# Patient Record
Sex: Female | Born: 1980 | Race: Black or African American | Hispanic: No | Marital: Single | Smoking: Never smoker
Health system: Southern US, Community
[De-identification: ages and names within clinical notes are randomized; demographics above are authoritative.]

## PROBLEM LIST (undated history)

## (undated) DIAGNOSIS — O139 Gestational [pregnancy-induced] hypertension without significant proteinuria, unspecified trimester: Secondary | ICD-10-CM

## (undated) DIAGNOSIS — E079 Disorder of thyroid, unspecified: Secondary | ICD-10-CM

## (undated) DIAGNOSIS — D649 Anemia, unspecified: Secondary | ICD-10-CM

## (undated) DIAGNOSIS — T7840XA Allergy, unspecified, initial encounter: Secondary | ICD-10-CM

## (undated) DIAGNOSIS — Z9889 Other specified postprocedural states: Secondary | ICD-10-CM

## (undated) DIAGNOSIS — E039 Hypothyroidism, unspecified: Secondary | ICD-10-CM

## (undated) DIAGNOSIS — I10 Essential (primary) hypertension: Secondary | ICD-10-CM

## (undated) DIAGNOSIS — R740 Nonspecific elevation of levels of transaminase and lactic acid dehydrogenase [LDH]: Secondary | ICD-10-CM

## (undated) DIAGNOSIS — Z972 Presence of dental prosthetic device (complete) (partial): Secondary | ICD-10-CM

## (undated) DIAGNOSIS — R7989 Other specified abnormal findings of blood chemistry: Secondary | ICD-10-CM

## (undated) DIAGNOSIS — F419 Anxiety disorder, unspecified: Secondary | ICD-10-CM

## (undated) DIAGNOSIS — K219 Gastro-esophageal reflux disease without esophagitis: Secondary | ICD-10-CM

## (undated) DIAGNOSIS — T8859XA Other complications of anesthesia, initial encounter: Secondary | ICD-10-CM

## (undated) DIAGNOSIS — L509 Urticaria, unspecified: Secondary | ICD-10-CM

## (undated) DIAGNOSIS — Z8742 Personal history of other diseases of the female genital tract: Secondary | ICD-10-CM

## (undated) DIAGNOSIS — J45909 Unspecified asthma, uncomplicated: Secondary | ICD-10-CM

## (undated) DIAGNOSIS — F32A Depression, unspecified: Secondary | ICD-10-CM

## (undated) DIAGNOSIS — R7301 Impaired fasting glucose: Secondary | ICD-10-CM

## (undated) DIAGNOSIS — E559 Vitamin D deficiency, unspecified: Secondary | ICD-10-CM

## (undated) DIAGNOSIS — G709 Myoneural disorder, unspecified: Secondary | ICD-10-CM

## (undated) DIAGNOSIS — R7401 Elevation of levels of liver transaminase levels: Secondary | ICD-10-CM

## (undated) DIAGNOSIS — E0500 Thyrotoxicosis with diffuse goiter without thyrotoxic crisis or storm: Secondary | ICD-10-CM

## (undated) DIAGNOSIS — R112 Nausea with vomiting, unspecified: Secondary | ICD-10-CM

## (undated) DIAGNOSIS — E669 Obesity, unspecified: Secondary | ICD-10-CM

## (undated) DIAGNOSIS — T4145XA Adverse effect of unspecified anesthetic, initial encounter: Secondary | ICD-10-CM

## (undated) DIAGNOSIS — E538 Deficiency of other specified B group vitamins: Secondary | ICD-10-CM

## (undated) DIAGNOSIS — R911 Solitary pulmonary nodule: Secondary | ICD-10-CM

## (undated) DIAGNOSIS — IMO0001 Reserved for inherently not codable concepts without codable children: Secondary | ICD-10-CM

## (undated) HISTORY — DX: Impaired fasting glucose: R73.01

## (undated) HISTORY — DX: Deficiency of other specified B group vitamins: E53.8

## (undated) HISTORY — DX: Personal history of other diseases of the female genital tract: Z87.42

## (undated) HISTORY — DX: Allergy, unspecified, initial encounter: T78.40XA

## (undated) HISTORY — DX: Thyrotoxicosis with diffuse goiter without thyrotoxic crisis or storm: E05.00

## (undated) HISTORY — PX: CERVICAL POLYPECTOMY: SHX88

## (undated) HISTORY — DX: Myoneural disorder, unspecified: G70.9

## (undated) HISTORY — DX: Anxiety disorder, unspecified: F41.9

## (undated) HISTORY — DX: Other specified postprocedural states: Z98.890

## (undated) HISTORY — PX: CHOLECYSTECTOMY: SHX55

## (undated) HISTORY — PX: BREAST SURGERY: SHX581

## (undated) HISTORY — DX: Nonspecific elevation of levels of transaminase and lactic acid dehydrogenase (ldh): R74.0

## (undated) HISTORY — DX: Elevation of levels of liver transaminase levels: R74.01

## (undated) HISTORY — DX: Vitamin D deficiency, unspecified: E55.9

## (undated) HISTORY — DX: Obesity, unspecified: E66.9

## (undated) HISTORY — DX: Reserved for inherently not codable concepts without codable children: IMO0001

## (undated) HISTORY — DX: Disorder of thyroid, unspecified: E07.9

## (undated) HISTORY — DX: Other specified abnormal findings of blood chemistry: R79.89

## (undated) HISTORY — DX: Essential (primary) hypertension: I10

## (undated) HISTORY — DX: Gestational (pregnancy-induced) hypertension without significant proteinuria, unspecified trimester: O13.9

## (undated) HISTORY — DX: Urticaria, unspecified: L50.9

## (undated) HISTORY — DX: Gastro-esophageal reflux disease without esophagitis: K21.9

---
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
## 2004-02-13 ENCOUNTER — Observation Stay: Payer: Self-pay | Admitting: Obstetrics & Gynecology

## 2004-03-27 ENCOUNTER — Emergency Department: Payer: Self-pay | Admitting: Emergency Medicine

## 2004-04-14 ENCOUNTER — Inpatient Hospital Stay: Payer: Self-pay | Admitting: Obstetrics & Gynecology

## 2004-04-15 DIAGNOSIS — I10 Essential (primary) hypertension: Secondary | ICD-10-CM

## 2004-05-01 ENCOUNTER — Emergency Department: Payer: Self-pay | Admitting: Emergency Medicine

## 2004-05-02 ENCOUNTER — Ambulatory Visit: Payer: Self-pay | Admitting: Emergency Medicine

## 2004-05-16 ENCOUNTER — Ambulatory Visit: Payer: Self-pay | Admitting: Surgery

## 2004-11-26 ENCOUNTER — Emergency Department: Payer: Self-pay | Admitting: Emergency Medicine

## 2004-12-28 ENCOUNTER — Emergency Department: Payer: Self-pay | Admitting: Emergency Medicine

## 2005-02-13 ENCOUNTER — Emergency Department: Payer: Self-pay | Admitting: Emergency Medicine

## 2005-06-15 ENCOUNTER — Emergency Department: Payer: Self-pay | Admitting: Emergency Medicine

## 2005-07-02 ENCOUNTER — Emergency Department: Payer: Self-pay | Admitting: Emergency Medicine

## 2005-08-09 ENCOUNTER — Emergency Department: Payer: Self-pay | Admitting: Emergency Medicine

## 2005-08-26 ENCOUNTER — Emergency Department: Payer: Self-pay | Admitting: General Practice

## 2005-10-06 ENCOUNTER — Emergency Department: Payer: Self-pay | Admitting: Emergency Medicine

## 2005-10-07 ENCOUNTER — Emergency Department: Payer: Self-pay | Admitting: Emergency Medicine

## 2006-02-21 ENCOUNTER — Emergency Department: Payer: Self-pay | Admitting: Emergency Medicine

## 2006-02-21 ENCOUNTER — Other Ambulatory Visit: Payer: Self-pay

## 2006-02-22 ENCOUNTER — Ambulatory Visit: Payer: Self-pay | Admitting: Emergency Medicine

## 2006-04-30 ENCOUNTER — Observation Stay: Payer: Self-pay

## 2006-06-06 ENCOUNTER — Observation Stay: Payer: Self-pay | Admitting: Obstetrics & Gynecology

## 2006-07-25 ENCOUNTER — Observation Stay: Payer: Self-pay | Admitting: Unknown Physician Specialty

## 2006-07-28 ENCOUNTER — Observation Stay: Payer: Self-pay

## 2006-07-29 ENCOUNTER — Inpatient Hospital Stay: Payer: Self-pay | Admitting: Unknown Physician Specialty

## 2006-09-23 ENCOUNTER — Ambulatory Visit: Payer: Self-pay

## 2006-10-28 ENCOUNTER — Emergency Department: Payer: Self-pay | Admitting: Emergency Medicine

## 2007-05-26 ENCOUNTER — Emergency Department: Payer: Self-pay | Admitting: Internal Medicine

## 2007-09-14 ENCOUNTER — Ambulatory Visit: Payer: Self-pay

## 2007-11-19 ENCOUNTER — Emergency Department: Payer: Self-pay | Admitting: Emergency Medicine

## 2008-04-24 ENCOUNTER — Ambulatory Visit: Payer: Self-pay

## 2009-06-08 ENCOUNTER — Emergency Department: Payer: Self-pay | Admitting: Emergency Medicine

## 2010-05-30 ENCOUNTER — Emergency Department: Payer: Self-pay | Admitting: Emergency Medicine

## 2010-06-10 ENCOUNTER — Emergency Department: Payer: Self-pay | Admitting: Emergency Medicine

## 2010-06-12 ENCOUNTER — Emergency Department: Payer: Self-pay | Admitting: Emergency Medicine

## 2010-10-27 ENCOUNTER — Emergency Department: Payer: Self-pay | Admitting: Emergency Medicine

## 2010-10-28 ENCOUNTER — Ambulatory Visit: Payer: Self-pay | Admitting: Emergency Medicine

## 2012-08-11 ENCOUNTER — Emergency Department: Payer: Self-pay | Admitting: Internal Medicine

## 2012-08-13 ENCOUNTER — Emergency Department: Payer: Self-pay | Admitting: Emergency Medicine

## 2012-08-13 LAB — BASIC METABOLIC PANEL
Anion Gap: 6 — ABNORMAL LOW (ref 7–16)
BUN: 10 mg/dL (ref 7–18)
Calcium, Total: 9 mg/dL (ref 8.5–10.1)
Chloride: 105 mmol/L (ref 98–107)
Co2: 23 mmol/L (ref 21–32)
Creatinine: 0.67 mg/dL (ref 0.60–1.30)
EGFR (African American): >60
EGFR (Non-African Amer.): >60
Glucose: 101 mg/dL — ABNORMAL HIGH (ref 65–99)
Osmolality: 267 (ref 275–301)
Potassium: 3.9 mmol/L (ref 3.5–5.1)
Sodium: 134 mmol/L — ABNORMAL LOW (ref 136–145)

## 2012-08-13 LAB — URINALYSIS, COMPLETE
Bacteria: NONE SEEN
Bilirubin,UR: NEGATIVE
Glucose,UR: NEGATIVE mg/dL (ref 0–75)
Ketone: NEGATIVE
Leukocyte Esterase: NEGATIVE
Nitrite: NEGATIVE
Ph: 9 (ref 4.5–8.0)
Protein: NEGATIVE
RBC,UR: 7 /HPF (ref 0–5)
Specific Gravity: 1.015 (ref 1.003–1.030)
Squamous Epithelial: 1
WBC UR: <1 /HPF (ref 0–5)

## 2012-08-13 LAB — CBC
HCT: 40.2 % (ref 35.0–47.0)
HGB: 13.1 g/dL (ref 12.0–16.0)
MCH: 28 pg (ref 26.0–34.0)
MCHC: 32.7 g/dL (ref 32.0–36.0)
MCV: 86 fL (ref 80–100)
Platelet: 304 10*3/uL (ref 150–440)
RBC: 4.69 10*6/uL (ref 3.80–5.20)
RDW: 13.6 % (ref 11.5–14.5)
WBC: 11.1 10*3/uL — ABNORMAL HIGH (ref 3.6–11.0)

## 2012-10-01 ENCOUNTER — Emergency Department: Payer: Self-pay | Admitting: Emergency Medicine

## 2013-08-23 ENCOUNTER — Ambulatory Visit: Payer: Self-pay | Admitting: Family Medicine

## 2013-08-24 ENCOUNTER — Ambulatory Visit: Payer: Self-pay | Admitting: Family Medicine

## 2013-08-26 DIAGNOSIS — R002 Palpitations: Secondary | ICD-10-CM

## 2013-10-09 DIAGNOSIS — J4520 Mild intermittent asthma, uncomplicated: Secondary | ICD-10-CM | POA: Insufficient documentation

## 2014-05-17 ENCOUNTER — Ambulatory Visit: Payer: Self-pay | Admitting: Family Medicine

## 2014-05-17 LAB — HM MAMMOGRAPHY

## 2014-06-27 DIAGNOSIS — Z124 Encounter for screening for malignant neoplasm of cervix: Secondary | ICD-10-CM | POA: Insufficient documentation

## 2014-06-27 LAB — HM PAP SMEAR

## 2014-07-18 ENCOUNTER — Ambulatory Visit: Payer: Self-pay | Admitting: General Surgery

## 2014-07-30 ENCOUNTER — Ambulatory Visit (INDEPENDENT_AMBULATORY_CARE_PROVIDER_SITE_OTHER): Payer: 59 | Admitting: General Surgery

## 2014-07-30 ENCOUNTER — Encounter: Payer: Self-pay | Admitting: General Surgery

## 2014-07-30 VITALS — BP 134/78 | HR 78 | Resp 15 | Ht 63.0 in | Wt 216.0 lb

## 2014-07-30 DIAGNOSIS — N644 Mastodynia: Secondary | ICD-10-CM | POA: Diagnosis not present

## 2014-07-30 NOTE — Patient Instructions (Addendum)
Follow up in 3 months.  Continue self breast exams. Call office for any new breast issues or concerns.  

## 2014-07-30 NOTE — Progress Notes (Signed)
Patient ID: Catherine Berg, female   DOB: [birth year], 34 y.o.   MRN: [id number]  Chief Complaint  Patient presents with  . Other    mammogram    HPI [name] is a 34 y.o. female who presents for a breast evaluation. The most recent mammogram and ultrasound was done on 05/17/14. Patient does perform regular self breast checks and gets regular mammograms done. She reports some soreness and fullness in the right breast that started about 2 months ago. She reports feeling a indistinct hardness in the right breast, she also reports warmth and redness occasionally in this breast. She described "Tiger stripes" running transversely across the upper breast. These were specifically not running towards the nipple. There is no history of nipple oral contact.  HPI  Past Medical History  Diagnosis Date  . Anxiety   . Low serum vitamin D     Past Surgical History  Procedure Laterality Date  . Cesarean section      No family history on file.  Social History History  Substance Use Topics  . Smoking status: Never Smoker   . Smokeless tobacco: Never Used  . Alcohol Use: No    Allergies  Allergen Reactions  . Latex Hives  . Shellfish Allergy Hives    Current Outpatient Prescriptions  Medication Sig Dispense Refill  . etonogestrel (NEXPLANON) 68 MG IMPL implant 1 each by Subdermal route once.    Marland Kitchen LORazepam (ATIVAN) 0.5 MG tablet Take 0.5 mg by mouth as needed for anxiety.    Marland Kitchen VITAMIN D, CHOLECALCIFEROL, PO Take 1 capsule by mouth once a week.     No current facility-administered medications for this visit.    Review of Systems Review of Systems  Constitutional: Negative.   Respiratory: Negative.   Cardiovascular: Negative.     Blood pressure 134/78, pulse 78, resp. rate 15, height  (1.6 m), weight 216 lb (97.977 kg).  Physical Exam Physical Exam  Constitutional: She is oriented to person, place, and time. She appears well-developed and well-nourished.  Eyes:  Conjunctivae are normal. No scleral icterus.  Neck: Neck supple.  Cardiovascular: Normal rate, regular rhythm and normal heart sounds.   Pulmonary/Chest: Effort normal and breath sounds normal. Right breast exhibits tenderness. Right breast exhibits no inverted nipple, no mass, no nipple discharge and no skin change. Left breast exhibits no inverted nipple, no mass, no nipple discharge, no skin change and no tenderness.    Lymphadenopathy:    She has no cervical adenopathy.    She has no axillary adenopathy.  Neurological: She is alert and oriented to person, place, and time.  Skin: Skin is warm and dry.    Data Reviewed PCP notes of uric 24, 2016.  Laboratory studies of 06/27/2014 showed white blood cell count of 7000 with 47% polys and 34% lymphocytes. Hemoglobin 13.1. Comprehensive metabolic panel was normal.  Bilateral mammograms showed essentially fatty replaced breast. Ultrasound 05/17/2014 was negative. BI-RADS-1.  Assessment    Breast thickening along the inframammary fold without discrete mass.  Intermittent mastalgia/swelling/warm without clear etiology.     Plan    The patient has nearly fatty replaced breasts at her mammograms are of excellent quality. Her exam today is negative for distinct mass outside of adipose tissue. I've encouraged a careful watchful waiting program, and we'll arrange to see the patient again in 3 months. She's been encouraged to call earlier if new problems arise.     PCP/Ref: Dr Colvin Caroli, Tinnie Gens  W 07/30/2014, 10:08 PM

## 2014-08-02 ENCOUNTER — Emergency Department
Admit: 2014-08-02 | Disposition: A | Discharge status: Home / Self Care | Payer: Self-pay | Admitting: Emergency Medicine

## 2014-10-30 ENCOUNTER — Ambulatory Visit: Payer: 59 | Admitting: General Surgery

## 2014-11-06 ENCOUNTER — Telehealth: Payer: Self-pay

## 2014-11-06 NOTE — Telephone Encounter (Signed)
She is having some itching in the vaginal area and would like a rx for Diflucan.

## 2014-11-08 MED ORDER — FLUCONAZOLE 150 MG PO TABS: 150.0000 mg | ORAL_TABLET | Freq: Once | ORAL | Status: DC

## 2014-11-08 NOTE — Telephone Encounter (Signed)
Patient notified

## 2014-11-08 NOTE — Telephone Encounter (Signed)
If not better with diflucan, needs to come in for swab.

## 2014-11-08 NOTE — Telephone Encounter (Signed)
Dr. Laural BenesJohnson, I sent this to Dr. Sherie DonLada on Tuesday but she must have never looked at it yesterday. She is still itching and would like a rx for Diflucan.

## 2014-12-06 ENCOUNTER — Encounter: Payer: Self-pay | Admitting: *Deleted

## 2014-12-18 ENCOUNTER — Telehealth: Payer: Self-pay

## 2014-12-18 NOTE — Telephone Encounter (Signed)
Patient left message to call [id number].

## 2014-12-18 NOTE — Telephone Encounter (Signed)
Left message to call.

## 2014-12-19 NOTE — Telephone Encounter (Signed)
I'm glad to hear she has an appt upcoming I guess I don't know what to recommend without examining her and getting tests Request labs from health dept

## 2014-12-19 NOTE — Telephone Encounter (Signed)
Female issues going on, she went to health department and had all STD testing done. Everything came back negative.They gave her no meds. But she states she still feels irritated below. Almost like a UTI feeling, sometimes painful when urinating. They did not check a urine at the health dept. She has no vaginal discharge. She does not know if the health dept. Checked for a yeast infection or BV infection. But she was on a high dose of antibiotics. Wants advice on what to do.

## 2014-12-20 DIAGNOSIS — E669 Obesity, unspecified: Secondary | ICD-10-CM | POA: Insufficient documentation

## 2014-12-20 DIAGNOSIS — E538 Deficiency of other specified B group vitamins: Secondary | ICD-10-CM | POA: Insufficient documentation

## 2014-12-20 DIAGNOSIS — Z8742 Personal history of other diseases of the female genital tract: Secondary | ICD-10-CM | POA: Insufficient documentation

## 2014-12-20 DIAGNOSIS — R7301 Impaired fasting glucose: Secondary | ICD-10-CM | POA: Insufficient documentation

## 2014-12-20 DIAGNOSIS — E559 Vitamin D deficiency, unspecified: Secondary | ICD-10-CM | POA: Insufficient documentation

## 2014-12-21 NOTE — Telephone Encounter (Signed)
Patient notified

## 2014-12-24 ENCOUNTER — Ambulatory Visit (INDEPENDENT_AMBULATORY_CARE_PROVIDER_SITE_OTHER): Payer: 59 | Admitting: Family Medicine

## 2014-12-24 ENCOUNTER — Encounter: Payer: Self-pay | Admitting: Family Medicine

## 2014-12-24 VITALS — BP 142/82 | HR 82 | Temp 97.2°F | Ht 62.5 in | Wt 211.0 lb

## 2014-12-24 DIAGNOSIS — Z114 Encounter for screening for human immunodeficiency virus [HIV]: Secondary | ICD-10-CM | POA: Diagnosis not present

## 2014-12-24 DIAGNOSIS — F4381 Prolonged grief disorder: Secondary | ICD-10-CM

## 2014-12-24 DIAGNOSIS — R7301 Impaired fasting glucose: Secondary | ICD-10-CM

## 2014-12-24 DIAGNOSIS — I10 Essential (primary) hypertension: Secondary | ICD-10-CM

## 2014-12-24 DIAGNOSIS — F329 Major depressive disorder, single episode, unspecified: Secondary | ICD-10-CM | POA: Diagnosis not present

## 2014-12-24 DIAGNOSIS — E538 Deficiency of other specified B group vitamins: Secondary | ICD-10-CM | POA: Diagnosis not present

## 2014-12-24 DIAGNOSIS — R300 Dysuria: Secondary | ICD-10-CM | POA: Diagnosis not present

## 2014-12-24 DIAGNOSIS — F4321 Adjustment disorder with depressed mood: Secondary | ICD-10-CM

## 2014-12-24 DIAGNOSIS — R102 Pelvic and perineal pain unspecified side: Secondary | ICD-10-CM

## 2014-12-24 DIAGNOSIS — F4329 Adjustment disorder with other symptoms: Secondary | ICD-10-CM

## 2014-12-24 DIAGNOSIS — F32A Depression, unspecified: Secondary | ICD-10-CM

## 2014-12-24 DIAGNOSIS — E559 Vitamin D deficiency, unspecified: Secondary | ICD-10-CM | POA: Diagnosis not present

## 2014-12-24 LAB — UA/M W/RFLX CULTURE, ROUTINE
Bilirubin, UA: NEGATIVE
Glucose, UA: NEGATIVE
Ketones, UA: NEGATIVE
Leukocytes, UA: NEGATIVE
Nitrite, UA: NEGATIVE
Protein, UA: NEGATIVE
Specific Gravity, UA: 1.02 (ref 1.005–1.030)
Urobilinogen, Ur: 1 mg/dL (ref 0.2–1.0)
pH, UA: 6 (ref 5.0–7.5)

## 2014-12-24 LAB — WET PREP FOR TRICH, YEAST, CLUE
Clue Cell Exam: POSITIVE — AB
Trichomonas Exam: NEGATIVE
Yeast Exam: NEGATIVE

## 2014-12-24 LAB — MICROSCOPIC EXAMINATION

## 2014-12-24 MED ORDER — ESCITALOPRAM OXALATE 10 MG PO TABS: 10.0000 mg | ORAL_TABLET | Freq: Every day | ORAL | Status: DC

## 2014-12-24 MED ORDER — AMLODIPINE BESYLATE 5 MG PO TABS: 5.0000 mg | ORAL_TABLET | Freq: Every day | ORAL | Status: DC

## 2014-12-24 NOTE — Assessment & Plan Note (Signed)
DASH guidelines; modest weight loss; start CCB, discussed possible side effects; hope that after months of healthier eating and weight loss, we can discontinue the CCB; return for close f/u

## 2014-12-24 NOTE — Assessment & Plan Note (Signed)
Check A1C and glucose today; lots of recent stress which could affect cortisol and glucose

## 2014-12-24 NOTE — Progress Notes (Signed)
BP 142/82 mmHg  Pulse 82  Temp(Src) 97.2 F (36.2 C)  Ht 5' 2.5" (1.588 m)  Wt 211 lb (95.709 kg)  BMI 37.95 kg/m2  SpO2 100%  LMP 12/14/2014   Subjective:    Patient ID: Catherine Berg, female    DOB: 11-07-1980, 34 y.o.   MRN: [id number]  HPI: SANDRINE BLOODSWORTH is a 34 y.o. female  Chief Complaint  Patient presents with  . Depression    She is struggling after loosing her mom  . Vaginal Itching    not "itching" as much but feels irritated   She is having some irritation down below; she went to the health dept, but they didn't find anything; she did the pelvic exam and it kind of hurt; she has a lot going on she says She is struggling since her mother's death; got sick on Sep 26, 2022 and died on 23-Sep-2022; cries a lot of days; she has thought about taking medication; she has never taken anything for mood; waking up at night; no thoughts of self-harm or hurting others; appetite is up and down; gets upset and a little nauseated, some trouble with concentration sometimes; headaches; not migraines; crazy thirsty lately  Vitamin D deficiency; last labs in Feb 2016  Nexplanon out; this is her 3rd period since she had it removed, LMP started around August 12th; last period was heavier than before; sexually active, breasts a little sore but all over, not on the sides  Has an issue with her legs hurting; hurt all the way from mid thigh all the way down; stands at work all day; been there eight years; also has pain along the plantar surface of both feet when she puts her feet on the floor  Her blood pressure has been a little high at the dentist; she had community program at church, 140s/80s; HTN runs in family; she has never taken medicines for HTN, but did have pregnancy-induced hypertension, not pre-eclampsia  Relevant past medical, surgical, family and social history reviewed and updated as indicated. Interim medical history since our last visit reviewed. Allergies and medications reviewed and  updated.  Review of Systems  Per HPI unless specifically indicated above     Objective:    BP 142/82 mmHg  Pulse 82  Temp(Src) 97.2 F (36.2 C)  Ht 5' 2.5" (1.588 m)  Wt 211 lb (95.709 kg)  BMI 37.95 kg/m2  SpO2 100%  LMP 12/14/2014  Wt Readings from Last 3 Encounters:  12/24/14 211 lb (95.709 kg)  06/27/14 216 lb (97.977 kg)  07/30/14 216 lb (97.977 kg)    Physical Exam  Constitutional: She appears well-developed and well-nourished.  obese  HENT:  Head: Normocephalic and atraumatic.  Eyes: EOM are normal. No scleral icterus.  Neck: No thyromegaly present.  Cardiovascular: Normal rate and regular rhythm.   Pulmonary/Chest: Effort normal and breath sounds normal.  Abdominal: She exhibits no distension. There is tenderness in the suprapubic area. There is no guarding and no CVA tenderness.  Genitourinary: There is no rash on the right labia. There is no rash on the left labia. Uterus is not tender. Cervix exhibits no motion tenderness, no discharge and no friability. Right adnexum displays no mass, no tenderness and no fullness. Left adnexum displays no mass, no tenderness and no fullness. No erythema or bleeding in the vagina. Vaginal discharge (watery thin discharge at the introitus) found.  Psychiatric: Her speech is normal and behavior is normal. Judgment and thought content normal. Her mood appears not anxious.  Her affect is not angry, not blunt, not labile and not inappropriate. She is not slowed and not withdrawn. Cognition and memory are normal. She exhibits a depressed mood. She expresses no homicidal and no suicidal ideation.      Assessment & Plan:   Problem List Items Addressed This Visit      Cardiovascular and Mediastinum   Essential hypertension, benign    DASH guidelines; modest weight loss; start CCB, discussed possible side effects; hope that after months of healthier eating and weight loss, we can discontinue the CCB; return for close f/u      Relevant  Medications   amLODipine (NORVASC) 5 MG tablet     Digestive   Vitamin B12 deficiency    Check level and supplement if needed      Relevant Orders   Vitamin B12     Endocrine   IFG (impaired fasting glucose)    Check A1C and glucose today; lots of recent stress which could affect cortisol and glucose      Relevant Orders   Hgb A1c w/o eAG     Other   Vitamin D deficiency disease    Check level today, as low vit D could worsen symptoms of depression and myalgias      Relevant Orders   Vit D  25 hydroxy (rtn osteoporosis monitoring)   Dysuria    Urine collected today, will see if positive for acute cystitis; no CVA to suggest pyelo      Pelvic pain in female   Relevant Orders   Comprehensive metabolic panel   UA/M w/rflx Culture, Routine (Completed)   Chlamydia/Gonococcus/Trichomonas, NAA   WET PREP FOR TRICH, YEAST, CLUE (Completed)   Prolonged grief reaction - Primary    Suggested seeing Hospice counselors for grief; supportive listening provided; start SSRI, close f/u       Other Visit Diagnoses    Depression        Relevant Medications    escitalopram (LEXAPRO) 10 MG tablet    Other Relevant Orders    TSH    Screening for HIV (human immunodeficiency virus)        Relevant Orders    HIV antibody       Follow up plan: Return in about 3 weeks (around 01/14/2015) for blood pressure, mood.  An after-visit summary was printed and given to the patient at check-out.  Please see the patient instructions which may contain other information and recommendations beyond what is mentioned above in the assessment and plan.  Meds ordered this encounter  Medications  . escitalopram (LEXAPRO) 10 MG tablet    Sig: Take 1 tablet (10 mg total) by mouth daily.    Dispense:  30 tablet    Refill:  0  . amLODipine (NORVASC) 5 MG tablet    Sig: Take 1 tablet (5 mg total) by mouth daily.    Dispense:  30 tablet    Refill:  1

## 2014-12-24 NOTE — Assessment & Plan Note (Signed)
Suggested seeing Hospice counselors for grief; supportive listening provided; start SSRI, close f/u

## 2014-12-24 NOTE — Assessment & Plan Note (Signed)
Urine collected today, will see if positive for acute cystitis; no CVA to suggest pyelo

## 2014-12-24 NOTE — Assessment & Plan Note (Signed)
Check level and supplement if needed 

## 2014-12-24 NOTE — Assessment & Plan Note (Signed)
Check level today, as low vit D could worsen symptoms of depression and myalgias

## 2014-12-24 NOTE — Patient Instructions (Signed)
Please do contact Hospice for counseling If you need additional contact information for other counselors, you can see [employer] or give [place] a call Start the new blood pressure medicine Start the new mood medicine If you feel worse prior to your next appointment, please call Return in 3 weeks

## 2014-12-25 ENCOUNTER — Telehealth: Payer: Self-pay | Admitting: Family Medicine

## 2014-12-25 DIAGNOSIS — E0590 Thyrotoxicosis, unspecified without thyrotoxic crisis or storm: Secondary | ICD-10-CM | POA: Insufficient documentation

## 2014-12-25 LAB — COMPREHENSIVE METABOLIC PANEL
ALT: 22 IU/L (ref 0–32)
AST: 18 IU/L (ref 0–40)
Albumin/Globulin Ratio: 1.3 (ref 1.1–2.5)
Albumin: 4.1 g/dL (ref 3.5–5.5)
Alkaline Phosphatase: 74 IU/L (ref 39–117)
BUN/Creatinine Ratio: 16 (ref 8–20)
BUN: 9 mg/dL (ref 6–20)
Bilirubin Total: 0.2 mg/dL (ref 0.0–1.2)
CO2: 23 mmol/L (ref 18–29)
Calcium: 9.6 mg/dL (ref 8.7–10.2)
Chloride: 100 mmol/L (ref 97–108)
Creatinine, Ser: 0.56 mg/dL — ABNORMAL LOW (ref 0.57–1.00)
GFR calc Af Amer: 141 mL/min/{1.73_m2} (ref >59)
GFR calc non Af Amer: 122 mL/min/{1.73_m2} (ref >59)
Globulin, Total: 3.2 g/dL (ref 1.5–4.5)
Glucose: 88 mg/dL (ref 65–99)
Potassium: 4.4 mmol/L (ref 3.5–5.2)
Sodium: 139 mmol/L (ref 134–144)
Total Protein: 7.3 g/dL (ref 6.0–8.5)

## 2014-12-25 LAB — TSH: TSH: 0.005 u[IU]/mL — ABNORMAL LOW (ref 0.450–4.500)

## 2014-12-25 LAB — HGB A1C W/O EAG: Hgb A1c MFr Bld: 5.4 % (ref 4.8–5.6)

## 2014-12-25 LAB — HIV ANTIBODY (ROUTINE TESTING W REFLEX): HIV Screen 4th Generation wRfx: NONREACTIVE

## 2014-12-25 LAB — VITAMIN B12: Vitamin B-12: 341 pg/mL (ref 211–946)

## 2014-12-25 LAB — CHLAMYDIA/GONOCOCCUS/TRICHOMONAS, NAA: Trich vag by NAA: NEGATIVE

## 2014-12-25 LAB — VITAMIN D 25 HYDROXY (VIT D DEFICIENCY, FRACTURES): Vit D, 25-Hydroxy: 19.3 ng/mL — ABNORMAL LOW (ref 30.0–100.0)

## 2014-12-25 MED ORDER — CLINDAMYCIN PHOSPHATE 2 % VA CREA: 1.0000 | TOPICAL_CREAM | Freq: Every day | VAGINAL | Status: DC

## 2014-12-25 NOTE — Telephone Encounter (Signed)
Start taking 5,000 iu vitamin D3 daily, one a day for 3 months, then 1,000 iu vit D3 daily Vitamin B12 500 or 1000 mcg daily, year She had very low TSH; she saw endo over a year ago, and didn't like her and will see another endo; [facility] requested Endo did [place] already at [facility] to evaluate this so I will not order another Clue cells on wet mount; she doesn't want metronidazole; will send another med Other labs okay

## 2015-01-14 ENCOUNTER — Telehealth: Payer: Self-pay | Admitting: Family Medicine

## 2015-01-14 ENCOUNTER — Ambulatory Visit: Payer: PRIVATE HEALTH INSURANCE | Admitting: Family Medicine

## 2015-01-14 NOTE — Telephone Encounter (Signed)
PT CAME IN AT 10:15 THINKING HER APPT WAS AT 10:45 BUT IT WAS AT 8. SHE SOUL LIKE TO SPEAK WITH DR LADA ABOUT SOME THINGS THAT ARE GOING ON.

## 2015-01-14 NOTE — Telephone Encounter (Signed)
Routing to Dr. Sherie Don. Would you like to call her?

## 2015-01-14 NOTE — Telephone Encounter (Signed)
Called and patient agreed to come in tomorrow, 01/15/15, at 9:30 for an appointment with Dr. Sherie Don.

## 2015-01-14 NOTE — Telephone Encounter (Signed)
Please put her on the schedule for tomorrow morning, it looks like 9:30 am is open We had a lot to go over from last visit, so I prefer to see her in the office If there is something urgent that can't wait until tomorrow, let me know

## 2015-01-15 ENCOUNTER — Ambulatory Visit (INDEPENDENT_AMBULATORY_CARE_PROVIDER_SITE_OTHER): Payer: 59 | Admitting: Family Medicine

## 2015-01-15 ENCOUNTER — Encounter: Payer: Self-pay | Admitting: Family Medicine

## 2015-01-15 VITALS — BP 138/84 | HR 77 | Temp 98.1°F | Ht 62.5 in | Wt 212.4 lb

## 2015-01-15 DIAGNOSIS — E538 Deficiency of other specified B group vitamins: Secondary | ICD-10-CM

## 2015-01-15 DIAGNOSIS — F4321 Adjustment disorder with depressed mood: Secondary | ICD-10-CM

## 2015-01-15 DIAGNOSIS — F4329 Adjustment disorder with other symptoms: Secondary | ICD-10-CM

## 2015-01-15 DIAGNOSIS — F4381 Prolonged grief disorder: Secondary | ICD-10-CM

## 2015-01-15 DIAGNOSIS — I10 Essential (primary) hypertension: Secondary | ICD-10-CM

## 2015-01-15 DIAGNOSIS — E0590 Thyrotoxicosis, unspecified without thyrotoxic crisis or storm: Secondary | ICD-10-CM | POA: Diagnosis not present

## 2015-01-15 MED ORDER — AMLODIPINE BESYLATE 10 MG PO TABS: 10.0000 mg | ORAL_TABLET | Freq: Every day | ORAL | Status: DC

## 2015-01-15 NOTE — Assessment & Plan Note (Addendum)
Continue the escitalopram; refer to psychiatrist; out of work for depressive symptoms, prolonged grief reaction; supportive listening provided; reasons to call, seek medical attention reviewed

## 2015-01-15 NOTE — Patient Instructions (Addendum)
Start taking your escitalopram at bedtime Increase the blood pressure medicine from 5 mg to 10 mg daily Limit salt and follow DASH guidelines We'll refer you to a psychiatrist Catherine Berg -- write note to keep patient out of work for three weeks, return to work (tentatively on February 04, 2015) Continue to take the vitamin supplements

## 2015-01-15 NOTE — Progress Notes (Signed)
BP 138/84 mmHg  Pulse 77  Temp(Src) 98.1 F (36.7 C)  Ht 5' 2.5" (1.588 m)  Wt 212 lb 6.4 oz (96.344 kg)  BMI 38.21 kg/m2  SpO2 100%  LMP 01/12/2015 (Exact Date)   Subjective:    Patient ID: Catherine Berg, female    DOB: [birth year], 34 y.o.   MRN: [id number]  HPI: Catherine Berg is a 34 y.o. female  Chief Complaint  Patient presents with  . Follow-up    pt states she is here for a re-check from last visit on 12/23/13   She has a lot going on; she is very very down; she wants to take a leave of absence; she gets panic attacks and her heart starts beating very fast; she is going to start grief counseling next Thursday She has been feeling really down; she started on the escitalopram; she is taking that once a day; it's so hard because the moment that her mother comes on her mind, she can't get her off of her mind; puts her in fogginess; not strength to do anything, no motivation to clean her house; no side effects from the escitalopram; takes it between 6-6:30 am before she takes  High blood pressure; was taking ibuprofen for headaches; better with the blood pressure medicine; if she gets really stressed out or she forgets the BP medicines, then headache comes back; today's BP reviewed; not checking BP away from her  Relevant past medical, surgical, family and social history reviewed and updated as indicated. Interim medical history since our last visit reviewed. Allergies and medications reviewed and updated.  Review of Systems  Neurological: Positive for headaches.   Per HPI unless specifically indicated above     Objective:    BP 138/84 mmHg  Pulse 77  Temp(Src) 98.1 F (36.7 C)  Ht 5' 2.5" (1.588 m)  Wt 212 lb 6.4 oz (96.344 kg)  BMI 38.21 kg/m2  SpO2 100%  LMP 01/12/2015 (Exact Date)  Wt Readings from Last 3 Encounters:  01/15/15 212 lb 6.4 oz (96.344 kg)  12/24/14 211 lb (95.709 kg)  06/27/14 216 lb (97.977 kg)    Physical Exam  Constitutional: She appears  well-developed and well-nourished. No distress.  Eyes: EOM are normal. No scleral icterus.  Neck: No thyromegaly present.  Cardiovascular: Normal rate.   Pulmonary/Chest: Effort normal.  Abdominal: She exhibits no distension.  Skin: No pallor.  Psychiatric: Her behavior is normal. Judgment and thought content normal. Her mood appears not anxious. Her affect is not angry, not blunt, not labile and not inappropriate. She is not slowed, not withdrawn and not actively hallucinating. Cognition and memory are not impaired. She exhibits a depressed mood. She expresses no homicidal and no suicidal ideation.  Good hygiene; good eye contact with eaminer She is attentive.    Results for orders placed or performed in visit on 12/24/14  Chlamydia/Gonococcus/Trichomonas, NAA  Result Value Ref Range   Trich vag by NAA Negative Negative  WET PREP FOR TRICH, YEAST, CLUE  Result Value Ref Range   Trichomonas Exam Negative Negative   Yeast Exam Negative Negative   Clue Cell Exam Positive (A) Negative  Microscopic Examination  Result Value Ref Range   WBC, UA 0-5 0 -  5 /hpf   RBC, UA 0-2 0 -  2 /hpf   Epithelial Cells (non renal) 0-10 0 - 10 /hpf   Bacteria, UA Few None seen/Few  Comprehensive metabolic panel  Result Value Ref Range   Glucose 88 65 -  99 mg/dL   BUN 9 6 - 20 mg/dL   Creatinine, Ser 1.61 (L) 0.57 - 1.00 mg/dL   GFR calc non Af Amer 122 >59 mL/min/1.73   GFR calc Af Amer 141 >59 mL/min/1.73   BUN/Creatinine Ratio 16 8 - 20   Sodium 139 134 - 144 mmol/L   Potassium 4.4 3.5 - 5.2 mmol/L   Chloride 100 97 - 108 mmol/L   CO2 23 18 - 29 mmol/L   Calcium 9.6 8.7 - 10.2 mg/dL   Total Protein 7.3 6.0 - 8.5 g/dL   Albumin 4.1 3.5 - 5.5 g/dL   Globulin, Total 3.2 1.5 - 4.5 g/dL   Albumin/Globulin Ratio 1.3 1.1 - 2.5   Bilirubin Total 0.2 0.0 - 1.2 mg/dL   Alkaline Phosphatase 74 39 - 117 IU/L   AST 18 0 - 40 IU/L   ALT 22 0 - 32 IU/L  Hgb A1c w/o eAG  Result Value Ref Range   Hgb  A1c MFr Bld 5.4 4.8 - 5.6 %  TSH  Result Value Ref Range   TSH 0.005 (L) 0.450 - 4.500 uIU/mL  Vit D  25 hydroxy (rtn osteoporosis monitoring)  Result Value Ref Range   Vit D, 25-Hydroxy 19.3 (L) 30.0 - 100.0 ng/mL  UA/M w/rflx Culture, Routine  Result Value Ref Range   Specific Gravity, UA 1.020 1.005 - 1.030   pH, UA 6.0 5.0 - 7.5   Color, UA Yellow Yellow   Appearance Ur Clear Clear   Leukocytes, UA Negative Negative   Protein, UA Negative Negative/Trace   Glucose, UA Negative Negative   Ketones, UA Negative Negative   RBC, UA Trace (A) Negative   Bilirubin, UA Negative Negative   Urobilinogen, Ur 1.0 0.2 - 1.0 mg/dL   Nitrite, UA Negative Negative   Microscopic Examination See below:   Vitamin B12  Result Value Ref Range   Vitamin B-12 341 211 - 946 pg/mL  HIV antibody  Result Value Ref Range   HIV Screen 4th Generation wRfx Non Reactive Non Reactive      Assessment & Plan:   Problem List Items Addressed This Visit      Cardiovascular and Mediastinum   Essential hypertension, benign    Fair control; patient is under stress; DASH guidelines encouraged      Relevant Medications   amLODipine (NORVASC) 10 MG tablet     Digestive   Vitamin B12 deficiency    Continue supplementation, as low B12 can exacerbate depressive symptoms and fatigue        Endocrine   Hyperthyroidism    Abnormally low TSH last visit; patient has been referred to endocrinologist        Other   Prolonged grief reaction - Primary    Continue the escitalopram; refer to psychiatrist; out of work for depressive symptoms, prolonged grief reaction; supportive listening provided; reasons to call, seek medical attention reviewed      Relevant Orders   Ambulatory referral to Psychiatry      Follow up plan: Return in about 2 weeks (around 02/01/2015) for follow-up.  An after-visit summary was printed and given to the patient at check-out.  Please see the patient instructions which may  contain other information and recommendations beyond what is mentioned above in the assessment and plan.  Orders Placed This Encounter  Procedures  . Ambulatory referral to Psychiatry   Face-to-face time with patient was more than 25 minutes, >50% time spent counseling and coordination of care

## 2015-01-20 NOTE — Assessment & Plan Note (Addendum)
Fair control; patient is under stress; DASH guidelines encouraged

## 2015-01-20 NOTE — Assessment & Plan Note (Signed)
Abnormally low TSH last visit; patient has been referred to endocrinologist

## 2015-01-20 NOTE — Assessment & Plan Note (Signed)
Continue supplementation, as low B12 can exacerbate depressive symptoms and fatigue

## 2015-01-22 ENCOUNTER — Encounter: Payer: Self-pay | Admitting: Family Medicine

## 2015-01-31 ENCOUNTER — Telehealth: Payer: Self-pay | Admitting: Family Medicine

## 2015-01-31 NOTE — Telephone Encounter (Signed)
Patient has a very abnormal TSH I just received a note from Dr. Tedd Sias that this patient's labs were faxed to her office Please make sure patient is going to be seeing endocrinologist very soon and that labs were sent to the correct office; thank you

## 2015-02-01 ENCOUNTER — Encounter: Payer: Self-pay | Admitting: Family Medicine

## 2015-02-01 ENCOUNTER — Ambulatory Visit (INDEPENDENT_AMBULATORY_CARE_PROVIDER_SITE_OTHER): Payer: 59 | Admitting: Family Medicine

## 2015-02-01 VITALS — BP 131/80 | HR 84 | Temp 97.7°F | Wt 212.0 lb

## 2015-02-01 DIAGNOSIS — F4321 Adjustment disorder with depressed mood: Secondary | ICD-10-CM

## 2015-02-01 DIAGNOSIS — E041 Nontoxic single thyroid nodule: Secondary | ICD-10-CM | POA: Diagnosis not present

## 2015-02-01 DIAGNOSIS — F4381 Prolonged grief disorder: Secondary | ICD-10-CM

## 2015-02-01 DIAGNOSIS — E538 Deficiency of other specified B group vitamins: Secondary | ICD-10-CM | POA: Diagnosis not present

## 2015-02-01 DIAGNOSIS — E559 Vitamin D deficiency, unspecified: Secondary | ICD-10-CM

## 2015-02-01 DIAGNOSIS — E0590 Thyrotoxicosis, unspecified without thyrotoxic crisis or storm: Secondary | ICD-10-CM | POA: Diagnosis not present

## 2015-02-01 DIAGNOSIS — F4329 Adjustment disorder with other symptoms: Secondary | ICD-10-CM

## 2015-02-01 MED ORDER — ESCITALOPRAM OXALATE 20 MG PO TABS
20.0000 mg | ORAL_TABLET | Freq: Every day | ORAL | Status: DC
Start: 2015-02-01 — End: 2015-03-05

## 2015-02-01 NOTE — Assessment & Plan Note (Signed)
Increase the SSRI; continue grief counseloing; patient not able to work at this time; extend leave for another four weeks from today

## 2015-02-01 NOTE — Assessment & Plan Note (Signed)
Continue supplementation  ?

## 2015-02-01 NOTE — Telephone Encounter (Signed)
Good Morning, but this message is confusing.   So Dr Tedd Sias received the labs then you said that you want me to make sure labs were faxed to the correct office?

## 2015-02-01 NOTE — Telephone Encounter (Signed)
I'm sorry if that was confusing Dr. Tedd Sias sent [place] a note to say she received a copy of this patient's labs The patient is not going to be seeing Dr. Tedd Sias The patient is going to be seeing another endocrinologist Therefore, I am concerned that the labs were sent to the wrong endocrinologist Can you please make sure the labs were sent to the correct endocrinologist? That's all.

## 2015-02-01 NOTE — Assessment & Plan Note (Signed)
Previously followed by endocrinologist; last scan over a year ago she thinks, so rescan today and appt with endo already scheduled

## 2015-02-01 NOTE — Patient Instructions (Signed)
Increase the escitalopram from 10 mg to 20 mg daily Continue to work with your counselor Call if any problems before next appointment Return in 3 weeks We'll get labs today and order the scan of your thyroid Keep appointment with endocrinologist

## 2015-02-01 NOTE — Assessment & Plan Note (Signed)
Recheck thyroid tests and order another thyroid US

## 2015-02-01 NOTE — Progress Notes (Signed)
BP 131/80 mmHg  Pulse 84  Temp(Src) 97.7 F (36.5 C)  Wt 212 lb (96.163 kg)  SpO2 100%  LMP 01/12/2015 (Exact Date)   Subjective:    Patient ID: Catherine Berg, female    DOB: July 17, 1980, 34 y.o.   MRN: [id number]  HPI: [name] is a 34 y.o. female  Chief Complaint  Patient presents with  . Follow-up    for Prolonged grief reaction, possibly continue FMLA?   She did hear from psychiatrist and has called them back to schedule; taking medicines but needs refills; out of work right now; working with grief counselor; see PHQ symptoms; she really believes a lot of her symptoms are from the death of her mother; felt a little better last week, but back in a slump; she does not feel like she can go back to work right now; currently taking 10 mg of lexapro; no dark thoughts; hopelessness better  Depression screen [facility] 2/9 02/01/2015 01/15/2015 12/24/2014  Decreased Interest Down, Depressed, Hopeless PHQ - 2 Score Altered sleeping Tired, decreased energy Change in appetite Feeling bad or failure about yourself  2 1 0  Trouble concentrating 2 1 0  Moving slowly or fidgety/restless 0 0 0  Suicidal thoughts 0 0 0  PHQ-9 Score Difficult doing work/chores - Somewhat difficult -   Depression screen [facility] 2/9 02/01/2015 01/15/2015 12/24/2014  Decreased Interest Down, Depressed, Hopeless PHQ - 2 Score Altered sleeping Tired, decreased energy Change in appetite Feeling bad or failure about yourself  2 1 0  Trouble concentrating 2 1 0  Moving slowly or fidgety/restless 0 0 0  Suicidal thoughts 0 0 0  PHQ-9 Score Difficult doing work/chores - Somewhat difficult -    She has an appointment with endocrinologist on November 1st; has hx of thyroid nodules; saw endo last year; weight is relatively stable over the last 6 weeks; she has noticed some tremor in her hands, loose stools, trouble  swallowing (see ROS)  Relevant past medical, surgical, family and social history reviewed and updated as indicated. Interim medical history since our last visit reviewed. Allergies and medications reviewed and updated.  Review of Systems  Constitutional: Negative for unexpected weight change.  HENT: Positive for trouble swallowing and voice change.   Cardiovascular: Positive for palpitations (heart will race sometimes). Negative for chest pain.  Gastrointestinal: Positive for diarrhea. Negative for constipation and blood in stool.  Endocrine: Positive for heat intolerance.  Genitourinary: Negative for hematuria.  Neurological: Positive for tremors (shaking this week, just started to notice; hands will shake).  Psychiatric/Behavioral: Positive for sleep disturbance, dysphoric mood and decreased concentration. Negative for self-injury.   Per HPI unless specifically indicated above     Objective:    BP 131/80 mmHg  Pulse 84  Temp(Src) 97.7 F (36.5 C)  Wt 212 lb (96.163 kg)  SpO2 100%  LMP 01/12/2015 (Exact Date)  Wt Readings from Last 3 Encounters:  02/01/15 212 lb (96.163 kg)  01/15/15 212 lb 6.4 oz (96.344 kg)  12/24/14 211 lb (95.709 kg)    Physical Exam  Constitutional: She appears well-developed and well-nourished. No distress.  HENT:  Head: Normocephalic and atraumatic.  Eyes:  EOM are normal. No scleral icterus.  Neck: Thyromegaly (mild thyromegaly, nontender) present.  Cardiovascular: Normal rate, regular rhythm and normal heart sounds.   No extrasystoles are present.  No murmur heard. Pulmonary/Chest: Effort normal and breath sounds normal. No respiratory distress. She has no wheezes.  Abdominal: Soft. Bowel sounds are normal. She exhibits no distension.  Musculoskeletal: Normal range of motion. She exhibits no edema.  Neurological: She is alert. She exhibits normal muscle tone.  Skin: Skin is warm and dry. She is not diaphoretic. No pallor.  Mild dryness   Psychiatric: Her behavior is normal. Judgment and thought content normal. Her mood appears not anxious. Her affect is blunt. Her affect is not labile and not inappropriate. She exhibits a depressed mood.  Good eye contact with examiner      Assessment & Plan:   Problem List Items Addressed This Visit      Digestive   Vitamin B12 deficiency    Continue supplementation        Endocrine   Hyperthyroidism    Recheck thyroid tests and order another thyroid US      Relevant Orders   TSH   T4, free   T3, free   Thyroglobulin antibody   Thyroid peroxidase antibody   Thyroid nodule - Primary    Previously followed by endocrinologist; last scan over a year ago she thinks, so rescan today and appt with endo already scheduled      Relevant Orders   TSH   T4, free   T3, free   Thyroglobulin antibody   Thyroid peroxidase antibody     Other   Vitamin D deficiency disease    Continue supplementation      Prolonged grief reaction    Increase the SSRI; continue grief counseloing; patient not able to work at this time; extend leave for another four weeks from today         Follow up plan: Return in about 3 weeks (around 02/22/2015) for mood and thyroid.  Orders Placed This Encounter  Procedures  . TSH  . T4, free  . T3, free  . Thyroglobulin antibody  . Thyroid peroxidase antibody    Meds ordered this encounter  Medications  . escitalopram (LEXAPRO) 20 MG tablet    Sig: Take 1 tablet (20 mg total) by mouth daily.    Dispense:  30 tablet    Refill:  2    Higher dose; cancel other refills please   An after-visit summary was printed and given to the patient at check-out.  Please see the patient instructions which may contain other information and recommendations beyond what is mentioned above in the assessment and plan.

## 2015-02-02 LAB — T3, FREE: T3, Free: 7.7 pg/mL — ABNORMAL HIGH (ref 2.0–4.4)

## 2015-02-02 LAB — T4, FREE: Free T4: 2.18 ng/dL — ABNORMAL HIGH (ref 0.82–1.77)

## 2015-02-02 LAB — THYROGLOBULIN ANTIBODY: Thyroglobulin Antibody: <1 IU/mL (ref 0.0–0.9)

## 2015-02-02 LAB — TSH: TSH: <0.006 u[IU]/mL — ABNORMAL LOW (ref 0.450–4.500)

## 2015-02-02 LAB — THYROID PEROXIDASE ANTIBODY: Thyroperoxidase Ab SerPl-aCnc: 85 IU/mL — ABNORMAL HIGH (ref 0–34)

## 2015-02-04 ENCOUNTER — Telehealth: Payer: Self-pay | Admitting: Family Medicine

## 2015-02-04 DIAGNOSIS — E0590 Thyrotoxicosis, unspecified without thyrotoxic crisis or storm: Secondary | ICD-10-CM

## 2015-02-04 DIAGNOSIS — E041 Nontoxic single thyroid nodule: Secondary | ICD-10-CM

## 2015-02-04 NOTE — Telephone Encounter (Signed)
Patient has an appt with Dr. Almond Lint Lab Results  Component Value Date   TSH <0.006* 02/01/2015  Free T4 2.18 Free T3 7.7 I called [facility] endocrinology to discuss starting methimazole prior to her appt with him if he agrees I spoke with Jill Side, then spoke with Consuella Lose; they cannot see the most recent set of labs, but do see labs from August Dr. Mylinda Latina is who the appt is with, 11/1 at 3 pm; her fax number is [phone] (care team info is wrong) I'll send staff message to Dr. Elijah Birk to see about getting pt on meds ------------------- Tiffany, Please fax labs from September to Dr. Elijah Birk at bold-faced number above; thank you

## 2015-02-04 NOTE — Telephone Encounter (Signed)
Tiffany, see below and please ask them to have Dr. Elijah Birk review the labs and call me back ASAP; I'd like to see if I should put pt on methimazole prior to appt in November (patient is symptomatic)

## 2015-02-05 NOTE — Progress Notes (Signed)
Done

## 2015-02-06 NOTE — Telephone Encounter (Signed)
Okay I gotcha thank you!  I have had quite a lot of referrals for endocrinology, so I assumed she was going there. But after I looked in her chart and i saw she was going to [facility] i faxed it to their office. So Its now done and sent to the correct location.

## 2015-02-11 ENCOUNTER — Ambulatory Visit: Payer: 59 | Admitting: Licensed Clinical Social Worker

## 2015-02-12 ENCOUNTER — Ambulatory Visit (INDEPENDENT_AMBULATORY_CARE_PROVIDER_SITE_OTHER): Payer: 59 | Admitting: Licensed Clinical Social Worker

## 2015-02-12 DIAGNOSIS — F4321 Adjustment disorder with depressed mood: Secondary | ICD-10-CM

## 2015-02-12 NOTE — Telephone Encounter (Signed)
Tiffany, please see below. I really need an answer soon.  Thank you.
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
## 2015-02-19 NOTE — Telephone Encounter (Signed)
TIFFANY, please see below. I need this addressed ASAP please.

## 2015-02-19 NOTE — Telephone Encounter (Signed)
Do you have Dr Ledell Peoplesaldwell's first name? Or a location of his or her office?

## 2015-02-19 NOTE — Telephone Encounter (Signed)
I was given the name Dr. Mylinda LatinaMarie Caldwell, but Dr. Almond LintBuse was in the care team as well I do not know who she is seeing; I just need to talk with whichever endocrinologist is going to see her and I need to talk to them about either moving her appt up or starting her on methimazole please

## 2015-02-19 NOTE — Telephone Encounter (Signed)
So I was able to speak with Jill Sideolleen who schedules appt and she had to send a message to the RN for Dr Elijah Birkaldwell. I also LM on Consuella LoseElaine, RN for Dr Elijah Birkaldwell so she should now have 2 messages one from their office staff and a VM from me. I asked to make sure that they had to labs scanned in and told them that you either would like to speak with one of the providers that she would be seeing or to please evaluate her being seen sooner. They do have my direct line and if they do not give her a sooner appt I will pull you from a room to speak with one of their providers immediately.

## 2015-02-20 MED ORDER — METHIMAZOLE 5 MG PO TABS: 5.0000 mg | ORAL_TABLET | Freq: Three times a day (TID) | ORAL | Status: DC

## 2015-02-20 NOTE — Assessment & Plan Note (Signed)
Still trying to get her in to see endocrinologist; referral entered August 23rd; they will not advise me prior to her appt; will start methimazole

## 2015-02-20 NOTE — Telephone Encounter (Signed)
I called to let patient know that we have not been able to get her seen any sooner by the endocrinologist I'm wanting to start her on medicine to help suppress this overactive thyroid just to buy us some more time; please have her start the new medicine If she starts to feel worse at all, call us right away; this is not my specialty, obviously, but I am not going to wait any longer to do something to try to help her I'm also getting an US of her thyroid since they've not been able to get her in She agrees with the plan She is not sleeping well, not able to focus If she gets any worse, please call me Her appt is Nov 1st

## 2015-02-20 NOTE — Assessment & Plan Note (Signed)
Order thyroid US; still trying to get her in to see endocrinologist; referral made August 23rd

## 2015-02-21 ENCOUNTER — Encounter: Payer: Self-pay | Admitting: Family Medicine

## 2015-02-21 ENCOUNTER — Ambulatory Visit
Admission: RE | Admit: 2015-02-21 | Discharge: 2015-02-21 | Disposition: A | Discharge status: Home / Self Care | Payer: 59 | Source: Ambulatory Visit | Attending: Family Medicine | Admitting: Family Medicine

## 2015-02-21 DIAGNOSIS — E042 Nontoxic multinodular goiter: Secondary | ICD-10-CM | POA: Insufficient documentation

## 2015-02-21 DIAGNOSIS — E0590 Thyrotoxicosis, unspecified without thyrotoxic crisis or storm: Secondary | ICD-10-CM | POA: Insufficient documentation

## 2015-02-22 ENCOUNTER — Ambulatory Visit (INDEPENDENT_AMBULATORY_CARE_PROVIDER_SITE_OTHER): Payer: 59 | Admitting: Family Medicine

## 2015-02-22 ENCOUNTER — Encounter: Payer: Self-pay | Admitting: Family Medicine

## 2015-02-22 VITALS — BP 129/83 | HR 81 | Temp 98.6°F | Wt 214.0 lb

## 2015-02-22 DIAGNOSIS — E041 Nontoxic single thyroid nodule: Secondary | ICD-10-CM | POA: Diagnosis not present

## 2015-02-22 DIAGNOSIS — B3731 Acute candidiasis of vulva and vagina: Secondary | ICD-10-CM

## 2015-02-22 DIAGNOSIS — F4329 Adjustment disorder with other symptoms: Secondary | ICD-10-CM

## 2015-02-22 DIAGNOSIS — B373 Candidiasis of vulva and vagina: Secondary | ICD-10-CM | POA: Diagnosis not present

## 2015-02-22 DIAGNOSIS — F4321 Adjustment disorder with depressed mood: Secondary | ICD-10-CM | POA: Diagnosis not present

## 2015-02-22 DIAGNOSIS — E0590 Thyrotoxicosis, unspecified without thyrotoxic crisis or storm: Secondary | ICD-10-CM | POA: Diagnosis not present

## 2015-02-22 DIAGNOSIS — F4381 Prolonged grief disorder: Secondary | ICD-10-CM

## 2015-02-22 MED ORDER — FLUCONAZOLE 150 MG PO TABS: 150.0000 mg | ORAL_TABLET | Freq: Once | ORAL | Status: DC

## 2015-02-22 MED ORDER — ZOLPIDEM TARTRATE 5 MG PO TABS: 5.0000 mg | ORAL_TABLET | Freq: Every evening | ORAL | Status: DC | PRN

## 2015-02-22 NOTE — Assessment & Plan Note (Addendum)
We have been trying to get her to see an endocrinologist for weeks; I personally called the endocrinology dept at [phone] and they will graciously see her on Wednesday; I have just started methimazole, and explained to the patient that I am not an endocrinologist, but this is the best I feel I can do until she can get in to see a specialist; she understands; if her mood darkens or energy worsens before Wednesday, decrease the methimazole to twice a day; I did not draw more labs today, because it will not change what I am able to do (get her in to see endo as fast as possible); out of work for 4 weeks; with her insomnia, I am willing to prescribe a week's worth of zolpidem; her hyperthyroidism is I am sure responsible for her poor sleep quality; do not mix with alcohol; assured patient that there is light at the end of the tunnel and she will not feel like this for long, and help is on the way with the endo appointment

## 2015-02-22 NOTE — Assessment & Plan Note (Signed)
Glad that she is working with [facility] psychiatrist office; she has completed intake and will see psychiatrist November 1st; I gave her the crisis line number to use if needed

## 2015-02-22 NOTE — Patient Instructions (Addendum)
You will see the endocrinologist on Wednesday morning at 10:30 at Dr. Hamilton Cap[facility]isogum [facility] in the same office as Dr. Tedd SiasSolum If you need to back down on the methimazole (too tired or feeling more down), then decrease to just twice a day If needed, call [phone] if you are at a real low place There is light at the end of the tunnel Use the sleeping pill before bed if needed (NO alcohol) Use the yeast infection medicine Return to see me in 4 weeks Out of work until then

## 2015-02-22 NOTE — Assessment & Plan Note (Addendum)
Two nodules, one 8 mm and one 7 mm on thyroid US done yesterday; she will be seeing endocrinologist on Wednesday

## 2015-02-22 NOTE — Progress Notes (Signed)
BP 129/83 mmHg  Pulse 81  Temp(Src) 98.6 F (37 C)  Wt 214 lb (97.07 kg)  SpO2 100%  LMP 02/08/2015   Subjective:    Patient ID: Catherine Berg, female    DOB: 06/10/1980, 34 y.o.   MRN: [id number]  HPI: Catherine Berg is a 34 y.o. female  Chief Complaint  Patient presents with  . Hypothyroidism    had thyroid u/s done yesterday  . Mood    3 week follow up   Patient is here for follow-up of hyperthyroidism and depression [facility] another two [facility]; I tried to get a chance to talk to their provider but they would not speak with me for consultation as to how to handle her condition prior to being seen She is really feeling tired today She takes a shower and it is like she ran a mile This week she has noticed a catch in her chest; not where she has to stop doing anything, but notices; no shortness of breath, just a little pain, no pressure, up above the left breast If she eats the night before and then does not eat as soon as she gets up, she feels sick to her stomach; sometimes she has to drink something She has been waking up so much at night She wakes up and then feels like she needs to eat; it is like a must, like she has to eat Her bowels are really loose; breakfast is not going to stay, it's going to run right through her Feels jittery and shaky; could not stop moving during her interview yesterday, up and back Mind is racing She took her first dose of methimazole last night  [facility] 02/22/2015 02/01/2015 01/15/2015 12/24/2014  Decreased Interest 1 2 3 2   Down, Depressed, Hopeless 1 1 2 3   PHQ - 2 Score 2 3 5 5   Altered sleeping 3 3 3 2   Tired, decreased energy 3 3 3 3   Change in appetite 3 3 2 1   Feeling bad or failure about yourself  1 2 1  0  Trouble concentrating 1 2 1  0  Moving slowly or fidgety/restless 0 0 0 0  Suicidal thoughts 0 0 0 0  PHQ-9 Score 13 16 15 11   Difficult doing work/chores Somewhat difficult - Somewhat  difficult -   She has been to see psychiatry dept; [facility]; they did her assessment; she goes to see the doctor there on November 1st and then back again on November 15th  She has some itching in her vaginal area; just started yesterday; thinks it is a yeast infection coming on; helped by diflucan; no other symptoms  Relevant past medical, surgical, family and social history reviewed and updated as indicated. Interim medical history since our last visit reviewed. Allergies and medications reviewed and updated. Review of Systems  Constitutional: Positive for fatigue.  Gastrointestinal:       Loose stools  Psychiatric/Behavioral: Positive for sleep disturbance. The patient is hyperactive.   Per HPI unless specifically indicated above     Objective:    BP 129/83 mmHg  Pulse 81  Temp(Src) 98.6 F (37 C)  Wt 214 lb (97.07 kg)  SpO2 100%  LMP 02/08/2015  Wt Readings from Last 3 Encounters:  02/22/15 214 lb (97.07 kg)  02/01/15 212 lb (96.163 kg)  01/15/15 212 lb 6.4 oz (96.344 kg)    Physical Exam  Constitutional: She appears  well-developed and well-nourished.  Neck: Thyromegaly present.  Cardiovascular: Normal rate and regular rhythm.   No murmur heard. Pulmonary/Chest: Effort normal and breath sounds normal.  Abdominal: Soft. She exhibits no distension.  Neurological: She displays no tremor.  Reflex Scores:      Patellar reflexes are 2+ on the right side and 2+ on the left side. Skin:  No facial flushing or diaphoresis  Psychiatric: Her speech is normal and behavior is normal. Judgment and thought content normal. Her affect is blunt. Her affect is not labile and not inappropriate. Cognition and memory are normal. She exhibits a depressed mood (mildly). She expresses no homicidal and no suicidal ideation.   Results for orders placed or performed in visit on 02/01/15  TSH  Result Value Ref Range   TSH <0.006 (L) 0.450 - 4.500 uIU/mL  T4, free   Result Value Ref Range   Free T4 2.18 (H) 0.82 - 1.77 ng/dL  T3, free  Result Value Ref Range   T3, Free 7.7 (H) 2.0 - 4.4 pg/mL  Thyroglobulin antibody  Result Value Ref Range   Thyroglobulin Antibody <1.0 0.0 - 0.9 IU/mL  Thyroid peroxidase antibody  Result Value Ref Range   Thyroperoxidase Ab SerPl-aCnc 85 (H) 0 - 34 IU/mL      Assessment & Plan:   Problem List Items Addressed This Visit      Endocrine   Hyperthyroidism - Primary    We have been trying to get her to see an endocrinologist for weeks; I personally called the endocrinology dept at [phone] and they will graciously see her on Wednesday; I have just started methimazole, and explained to the patient that I am not an endocrinologist, but this is the best I feel I can do until she can get in to see a specialist; she understands; if her mood darkens or energy worsens before Wednesday, decrease the methimazole to twice a day; I did not draw more labs today, because it will not change what I am able to do (get her in to see endo as fast as possible); out of work for 4 weeks; with her insomnia, I am willing to prescribe a week's worth of zolpidem; her hyperthyroidism is I am sure responsible for her poor sleep quality; do not mix with alcohol; assured patient that there is light at the end of the tunnel and she will not feel like this for long, and help is on the way with the endo appointment      Thyroid nodule    Two nodules, one 8 mm and one 7 mm on thyroid US done yesterday; she will be seeing endocrinologist on Wednesday        Other   Prolonged grief reaction    Glad that she is working with [facility] psychiatrist office; she has completed intake and will see psychiatrist November 1st; I gave her the crisis line number to use if needed       Other Visit Diagnoses    Vaginal candidiasis        fluconazole Rx provided    Relevant Medications    fluconazole (DIFLUCAN) 150 MG tablet       Follow up plan: Return  in about 4 weeks (around 03/22/2015) for follow-up. Face-to-face time with patient was more than 25 minutes, >50% time spent counseling and coordination of care An after-visit summary was printed and given to the patient at check-out.  Please see the patient instructions which may contain other information and recommendations beyond what  is mentioned above in the assessment and plan.  Meds ordered this encounter  Medications  . zolpidem (AMBIEN) 5 MG tablet    Sig: Take 1 tablet (5 mg total) by mouth at bedtime as needed for sleep.    Dispense:  7 tablet    Refill:  0  . fluconazole (DIFLUCAN) 150 MG tablet    Sig: Take 1 tablet (150 mg total) by mouth once.    Dispense:  1 tablet    Refill:  1   Fax notes to [phone]

## 2015-03-04 ENCOUNTER — Telehealth: Payer: Self-pay | Admitting: Family Medicine

## 2015-03-04 NOTE — Telephone Encounter (Signed)
Pt needs a new work note stating that she's been taken out of work for another month. She has one but it has personal info on it that does not need to seen by here employer.

## 2015-03-04 NOTE — Telephone Encounter (Signed)
Okay for work note, extend excuse through March 22, 2015; to return to work March 23, 2015; do not specify reason; thank you

## 2015-03-04 NOTE — Telephone Encounter (Signed)
routing to provider

## 2015-03-05 ENCOUNTER — Ambulatory Visit (INDEPENDENT_AMBULATORY_CARE_PROVIDER_SITE_OTHER): Payer: 59 | Admitting: Psychiatry

## 2015-03-05 ENCOUNTER — Encounter: Payer: Self-pay | Admitting: Psychiatry

## 2015-03-05 VITALS — BP 134/76 | HR 102 | Temp 98.0°F | Ht 63.0 in | Wt 218.6 lb

## 2015-03-05 DIAGNOSIS — F4321 Adjustment disorder with depressed mood: Secondary | ICD-10-CM

## 2015-03-05 DIAGNOSIS — Z634 Disappearance and death of family member: Secondary | ICD-10-CM

## 2015-03-05 MED ORDER — HYDROXYZINE PAMOATE 25 MG PO CAPS: ORAL_CAPSULE | ORAL | Status: DC

## 2015-03-05 MED ORDER — VENLAFAXINE HCL ER 37.5 MG PO CP24: ORAL_CAPSULE | ORAL | Status: DC
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
## 2015-03-13 ENCOUNTER — Ambulatory Visit (INDEPENDENT_AMBULATORY_CARE_PROVIDER_SITE_OTHER): Payer: 59 | Admitting: Unknown Physician Specialty

## 2015-03-13 ENCOUNTER — Encounter: Payer: Self-pay | Admitting: Unknown Physician Specialty

## 2015-03-13 ENCOUNTER — Telehealth: Payer: Self-pay | Admitting: Family Medicine

## 2015-03-13 VITALS — BP 136/84 | HR 84 | Temp 98.6°F | Ht 62.0 in | Wt 216.7 lb

## 2015-03-13 DIAGNOSIS — O4691 Antepartum hemorrhage, unspecified, first trimester: Secondary | ICD-10-CM

## 2015-03-13 DIAGNOSIS — O209 Hemorrhage in early pregnancy, unspecified: Secondary | ICD-10-CM

## 2015-03-13 NOTE — Addendum Note (Signed)
Addended by: Gabriel CirriWICKER, Kerby Borner on: 03/13/2015 03:57 PM   Modules accepted: Orders

## 2015-03-13 NOTE — Patient Instructions (Signed)
Laparoscopic Tubal Ligation Laparoscopic tubal ligation is a procedure that closes the fallopian tubes at a time other than right after childbirth. When the fallopian tubes are closed, the eggs that are released from the ovaries cannot enter the uterus, and sperm cannot reach the egg. Tubal ligation is also known as getting your "tubes tied." Tubal ligation is done so you will not be able to get pregnant or have a baby. Although this procedure may be undone (reversed), it should be considered permanent and irreversible. If you want to have future pregnancies, you should not have this procedure. LET YOUR HEALTH CARE PROVIDER KNOW ABOUT:  Any allergies you have.  All medicines you are taking, including vitamins, herbs, eye drops, creams, and over-the-counter medicines. This includes any use of steroids, either by mouth or in cream form.  Previous problems you or members of your family have had with the use of anesthetics.  Any blood disorders you have.  Previous surgeries you have had.  Any medical conditions you may have.  Possibility of pregnancy, if this applies.  Any past pregnancies. RISKS AND COMPLICATIONS  Infection.  Bleeding.  Injury to surrounding organs.  Side effects from anesthetics.  Failure of the procedure.  Ectopic pregnancy.  Future regret about having the procedure done. BEFORE THE PROCEDURE  Ask your health care provider about:  Changing or stopping your regular medicines. This is especially important if you are taking diabetes medicines or blood thinners.  Taking medicines such as aspirin and ibuprofen. These medicines can thin your blood. Do not take these medicines before your procedure if your health care provider instructs you not to.  Follow instructions from your health care provider about eating and drinking restrictions.  Plan to have someone take you home after the procedure.  If you go home right after the procedure, plan to have someone  with you for 24 hours. PROCEDURE  You will be given one or more of the following:  A medicine that helps you relax (sedative).  A medicine that numbs the area (local anesthetic).  A medicine that makes you fall asleep (general anesthetic).  A medicine that is injected into an area of your body that numbs everything below the injection site (regional anesthetic).  If you have been given general anesthetic, a tube will be put down your throat to help you breathe.  Two small cuts (incisions) will be made in the lower abdominal area and near the belly button.  Your bladder may be emptied with a small tube (catheter).  Your abdomen will be inflated with a safe gas (carbon dioxide). This will help to give the surgeon room to operate and visualize, and it will help the surgeon to avoid other organs.  A thin, lighted tube (laparoscope) with a camera attached will be inserted into your abdomen through one of the incisions near the belly button. Other small instruments will be inserted through the other abdominal incision.  The fallopian tubes will be tied off or burned (cauterized), or they will be blocked with a clip, ring, or clamp. In many cases, a small portion in the center of each fallopian tube will also be removed.  After the fallopian tubes are blocked, the gas will be released from the abdomen.  The incisions will be closed with stitches (sutures).  A bandage (dressing) will be placed over the incisions. The procedure may vary among health care providers and hospitals. AFTER THE PROCEDURE  Your blood pressure, heart rate, breathing rate, and blood oxygen level   will be monitored often until the medicines you were given have worn off.  You will be given pain medicine as needed.  If you had general anesthetic, you may have some mild discomfort in your throat. This is from the breathing tube that was placed in your throat while you were sleeping.  You may experience discomfort in  the shoulder area from some trapped air between your liver and your diaphragm. This sensation is normal, and it will slowly go away on its own.  You will have some mild abdominal discomfort for 3--7 days.   This information is not intended to replace advice given to you by your health care provider. Make sure you discuss any questions you have with your health care provider.   Document Released: 07/27/2000 Document Revised: 09/04/2014 Document Reviewed: 08/01/2011 Elsevier Interactive Patient Education 2016 [employer] Inc[employer]  

## 2015-03-13 NOTE — Telephone Encounter (Signed)
Wants to discuss change in medications done at visit with Gabriel Cirriheryl Wicker.  Please call her.

## 2015-03-13 NOTE — Addendum Note (Signed)
Addended by: Gabriel CirriWICKER, Katheen Aslin on: 03/13/2015 03:33 PM   Modules accepted: Orders

## 2015-03-13 NOTE — Addendum Note (Signed)
Addended by: Bayard HuggerPHARR, Millee Denise N on: 03/13/2015 04:15 PM   Modules accepted: Orders

## 2015-03-13 NOTE — Progress Notes (Signed)
   BP 136/84 mmHg  Pulse 84  Temp(Src) 98.6 F (37 C)  Ht 5\' 2"  (1.575 m)  Wt 216 lb 11.2 oz (98.294 kg)  BMI 39.62 kg/m2  SpO2 100%  LMP 02/08/2015   Subjective:    Patient ID: Catherine Berg, female    DOB: 06/09/1980, 34 y.o.   MRN: [id number]  HPI: Catherine Berg is a 34 y.o. female  Chief Complaint  Patient presents with  . Vaginal Bleeding    pt states she went to health department Monday and had a positive pregnancy test. States that during intercourse this morning, she started bleeding and states she is still bleeding some now.   Pt is 4 weeks 5 days pregnant and started bleeding today during sexual intercourse.  She is having some bleeding at this time. Pt with slight left sided pelvic pain.  She is not interested in birth control.  She has 2 children and not sure if she wants more   Relevant past medical, surgical, family and social history reviewed and updated as indicated. Interim medical history since our last visit reviewed. Allergies and medications reviewed and updated.  Review of Systems  Per HPI unless specifically indicated above     Objective:    BP 136/84 mmHg  Pulse 84  Temp(Src) 98.6 F (37 C)  Ht 5\' 2"  (1.575 m)  Wt 216 lb 11.2 oz (98.294 kg)  BMI 39.62 kg/m2  SpO2 100%  LMP 02/08/2015  Wt Readings from Last 3 Encounters:  03/13/15 216 lb 11.2 oz (98.294 kg)  03/05/15 218 lb 9.6 oz (99.156 kg)  02/22/15 214 lb (97.07 kg)    Physical Exam  Constitutional: She is oriented to person, place, and time. She appears well-developed and well-nourished. No distress.  HENT:  Head: Normocephalic and atraumatic.  Eyes: Conjunctivae and lids are normal. Right eye exhibits no discharge. Left eye exhibits no discharge. No scleral icterus.  Cardiovascular: Normal rate, regular rhythm and normal heart sounds.   Pulmonary/Chest: Effort normal and breath sounds normal. No respiratory distress.  Abdominal: Normal appearance and bowel sounds are normal. She  exhibits no distension. There is no splenomegaly or hepatomegaly. There is no tenderness.  Musculoskeletal: Normal range of motion.  Neurological: She is alert and oriented to person, place, and time.  Skin: Skin is intact. No rash noted. No pallor.  Psychiatric: She has a normal mood and affect. Her behavior is normal. Judgment and thought content normal.      Assessment & Plan:   Problem List Items Addressed This Visit    None    Visit Diagnoses    Vaginal bleeding in pregnancy, first trimester    -  Primary    Pt is less than 5 weeks.  We will check an HCG to see if levels still consistant with a 4 1/2 week pregnancy.  If they are, counseled to take it easy for a few     Relevant Orders    B-HCG Quant       Stop all medications.  If she continues to be pregnant, I will change Amlodipine to Metoprolol.  She will call her Endocrinologist about Tapazole which is class D in pregnancy.  She wants to ask Dr. Sherie DonLada on Monday about the Effexor.    Follow up plan: Return for I will call tomorrow with the results.

## 2015-03-14 ENCOUNTER — Other Ambulatory Visit: Payer: Self-pay | Admitting: Unknown Physician Specialty

## 2015-03-14 ENCOUNTER — Telehealth: Payer: Self-pay | Admitting: Unknown Physician Specialty

## 2015-03-14 LAB — BETA HCG QUANT (REF LAB): hCG Quant: 6391 m[IU]/mL

## 2015-03-14 MED ORDER — NIFEDIPINE ER OSMOTIC RELEASE 30 MG PO TB24: 30.0000 mg | ORAL_TABLET | Freq: Every day | ORAL | Status: DC

## 2015-03-14 NOTE — Telephone Encounter (Signed)
Called patient about HCG levels.  Levels indicate that she is pregnant and seems to have an early viable pregnancy.  Reviewed uptodate about BP recommendations.  Little data on the amlodipine she is on but there is good evidence for the safety of Nifedipine XR.  Will start at 30 mg.  This should be monitored closely by us or OB for titration.  She would like to talk to Dr. Sherie DonLada Monday about depression medication.  She is not taking Effexor or Hydroxyzine.  Not taking Ambien.  She has contacted her endocrinologist about options for methimazole.  She called her OB this AM to make an appointment.  Her bleeding has stopped

## 2015-03-15 NOTE — Telephone Encounter (Signed)
She wants to talk to Dr. Sherie DonLada about her meds on Monday (She is 4-[redacted] weeks pregnant).

## 2015-03-18 ENCOUNTER — Telehealth: Payer: Self-pay

## 2015-03-18 NOTE — Telephone Encounter (Signed)
She wants to talk with you about the Effexor and also she has a cold and wants to know what is safe for her to take.

## 2015-03-18 NOTE — Telephone Encounter (Signed)
Pt was scheduled for nurse ob intake at 4-5 wks, pregnancy confirmed by Dr. Christell Faithrissman's office and bhcg done also due to spotting after intercourse. I spoke with pt and explained that being seen 6-7 wks for nob optimal but if an emergency of course we could see her earlier. Currently we would not be able to see anything on ultrasound until maybe 6 or 7 wks. Pt informed that she could call her PCP for another BHCG, which could tell her more at this point.  Pt states she had intercourse on Monday, spotted on Wednesday when she went to her PCP. She states she wearing a panti-liner every day and some dark spotting now. I reassured pt this was not uncommon. Informed her if she starts bleeding like a period to contact office or vaginal bleeding as in changing pad, clothing every 30min. To an hour go to ER. She has had her medication adjusted for Graves Disease and HTN.  Appt for nob nurse visit to be made for 2 weeks. Pt voiced understanding.

## 2015-03-19 ENCOUNTER — Ambulatory Visit (INDEPENDENT_AMBULATORY_CARE_PROVIDER_SITE_OTHER): Payer: 59 | Admitting: Licensed Clinical Social Worker

## 2015-03-19 DIAGNOSIS — F4321 Adjustment disorder with depressed mood: Secondary | ICD-10-CM

## 2015-03-19 NOTE — Telephone Encounter (Signed)
I called home/cell number; reached voicemail; left detailed msg, sorry that I missed her, will try her tomorrow

## 2015-03-20 NOTE — Telephone Encounter (Signed)
Called patient and left vm to call us back for a f/u appointment for tomorrow.

## 2015-03-20 NOTE — Telephone Encounter (Signed)
I left detailed msg; trying to reach her again; if urgent matter, please go to urgent care or ER; if needed, please schedule an appt with me; otherwise, she is welcome to call me here or I will try her again later

## 2015-03-20 NOTE — Telephone Encounter (Signed)
Please call patient; please recommend she have an appt with me late tomorrow morning or tomorrow afternoon; I've not been able to reach her, so let's just invite her to come in

## 2015-03-20 NOTE — Telephone Encounter (Signed)
Please keep trying

## 2015-03-22 ENCOUNTER — Telehealth: Payer: Self-pay

## 2015-03-22 ENCOUNTER — Ambulatory Visit (INDEPENDENT_AMBULATORY_CARE_PROVIDER_SITE_OTHER): Payer: 59 | Admitting: Family Medicine

## 2015-03-22 ENCOUNTER — Encounter: Payer: Self-pay | Admitting: Family Medicine

## 2015-03-22 VITALS — BP 124/79 | HR 87 | Temp 97.9°F | Wt 223.0 lb

## 2015-03-22 DIAGNOSIS — E0500 Thyrotoxicosis with diffuse goiter without thyrotoxic crisis or storm: Secondary | ICD-10-CM

## 2015-03-22 DIAGNOSIS — O99281 Endocrine, nutritional and metabolic diseases complicating pregnancy, first trimester: Secondary | ICD-10-CM | POA: Diagnosis not present

## 2015-03-22 DIAGNOSIS — O99341 Other mental disorders complicating pregnancy, first trimester: Secondary | ICD-10-CM | POA: Diagnosis not present

## 2015-03-22 DIAGNOSIS — R12 Heartburn: Secondary | ICD-10-CM

## 2015-03-22 DIAGNOSIS — F329 Major depressive disorder, single episode, unspecified: Secondary | ICD-10-CM | POA: Diagnosis not present

## 2015-03-22 DIAGNOSIS — O26891 Other specified pregnancy related conditions, first trimester: Secondary | ICD-10-CM | POA: Diagnosis not present

## 2015-03-22 DIAGNOSIS — F32A Depression, unspecified: Secondary | ICD-10-CM

## 2015-03-22 DIAGNOSIS — E079 Disorder of thyroid, unspecified: Secondary | ICD-10-CM

## 2015-03-22 MED ORDER — LACTULOSE 10 GM/15ML PO SOLN: 10.0000 g | Freq: Every day | ORAL | Status: DC | PRN

## 2015-03-22 NOTE — Telephone Encounter (Signed)
Patient mentioned to me as she was checking out that she needed an updated letter since she was being extended out of work. I can write the letter, but just need to know when to put for her to return.

## 2015-03-22 NOTE — Telephone Encounter (Signed)
Tentative return on  April 08, 2015 Thank you

## 2015-03-22 NOTE — Progress Notes (Signed)
BP 124/79 mmHg  Pulse 87  Temp(Src) 97.9 F (36.6 C)  Wt 223 lb (101.152 kg)  SpO2 100%  LMP 02/08/2015   Subjective:    Patient ID: Catherine Berg, female    DOB: 06-02-1980, 34 y.o.   MRN: [id number]  HPI: Catherine Berg is a 34 y.o. female  Chief Complaint  Patient presents with  . Depression    Patient wants to discuss med changes due to being pregnant   She is pregnant, and this is my first visit with her since she found out; her psychiatric counselor and endocrinologist know about her pregnancy She has been having cramping and headaches and some spotting She has an appointment with [facility] (OB-GYN), but that was before she decided not to keep her baby; she has an appt to have an abortion on December 1st She has seen the psychiatrist at [facility]; she has an intake worker and a psychiatrist; that was before she found out she was pregnant She has talked to the intake worker on Tuesday; she knows about her pregnancy; they talked about her situations; she sees her in two weeks and she goes back to see the psychiatrist in early December No emotional, physical, or sexual abuse Depression screen [facility] 2/9 03/22/2015 02/22/2015 02/01/2015 01/15/2015 12/24/2014  Decreased Interest Down, Depressed, Hopeless PHQ - 2 Score Altered sleeping Tired, decreased energy Change in appetite Feeling bad or failure about yourself  0  Trouble concentrating 0  Moving slowly or fidgety/restless 1 0 0 0 0  Suicidal thoughts 0 0 0 0 0  PHQ-9 Score Difficult doing work/chores Somewhat difficult Somewhat difficult - Somewhat difficult -   She has not done what she needed to do; the endocrinologist diagnosed her with Graves disease; she was taking methimazole 15 mg all at once instead of 5 mg three times a day; she called the endocrinologist and told her about the pregnancy;  she switched the medicine, but she hasn't taken anything because she doesn't want the baby to have anything that might hurt if she does have it; the endo wants to switch her to the safest thing possible; they are going to do a scan of her thyroid in December; they are going to see if the whole thyroid is making too much or just the nodules; they talked about radiation pill and surgery  Relevant past medical, surgical, family and social history reviewed and updated as indicated. Interim medical history since our last visit reviewed. Allergies and medications reviewed and updated.  Review of Systems Per HPI unless specifically indicated above     Objective:    BP 124/79 mmHg  Pulse 87  Temp(Src) 97.9 F (36.6 C)  Wt 223 lb (101.152 kg)  SpO2 100%  LMP 02/08/2015  Wt Readings from Last 3 Encounters:  03/22/15 223 lb (101.152 kg)  03/13/15 216 lb 11.2 oz (98.294 kg)  03/05/15 218 lb 9.6 oz (99.156 kg)    Physical Exam  Constitutional: She appears well-developed and well-nourished. No distress.  Eyes: EOM are normal. No scleral icterus.  No exophthalmos or proptosis  Cardiovascular: Normal rate.   Pulmonary/Chest: Effort normal.  Neurological: She displays no tremor.  Skin: She is  not diaphoretic. No pallor.  Psychiatric: Her behavior is normal. Judgment and thought content normal. Her affect is blunt. Her affect is not labile and not inappropriate. Her speech is not delayed. She is not slowed and not withdrawn. She expresses no suicidal ideation.  Good contact with examiner   Results for orders placed or performed in visit on 03/13/15  Beta HCG, Quant (tumor marker)  Result Value Ref Range   hCG Quant 6391 mIU/mL      Assessment & Plan:   Problem List Items Addressed This Visit      Endocrine   Graves' disease    Being managed by endocrinologist; she will call her endocrinologist today, as she considers what is best for her and her unborn child, whether to keep her pregnancy  or abort; she will discuss medication options with her endocrinologist      Relevant Medications   propylthiouracil (PTU) 50 MG tablet   Thyroid disease during pregnancy in first trimester    Patient has talked with endocrinologist about her pregnancy, and medicine was switched, but she is currently not taking anything; I encouraged her to please call her endocrinologist to discuss medicine option best for her at this time      Relevant Medications   propylthiouracil (PTU) 50 MG tablet     Other   Depression during pregnancy in first trimester - Primary    Supportive listening provided; encouraged patient to talk more with her counselor, psychiatrist, thinking through what is best for her at this time, how her decision may affect her mental health not only now but also down the road; I wanted her to know she is free to talk with me about her thoughts and concerns without any fear of judgment; I am here to help her through this tough time; will keep her out of work for another two weeks, with close f/u; seek medical help for any of her conditions with me or specialist      Heartburn during pregnancy in first trimester    Ranitidine considered category B; avoid triggers if possible         Follow up plan: Return in about 2 weeks (around 04/05/2015).  Face-to-face time with patient was more than 25 minutes, >50% time spent counseling and coordination of care

## 2015-03-22 NOTE — Patient Instructions (Signed)
Call the counselor and psychiatrist Then call the endocrinologist Continue to stay out of work, out for two more weeks and then return to see me Okay for tylenol up to 2,000 mg per day if needed Avoid decongestants Try to use PLAIN allergy medicine without the decongestant Avoid: phenylephrine, phenylpropanolamine, and pseudoephredine Stay hydrated Okay to use ranitidine for heartburn or acid reflux if needed Okay to use Lactulose for constipation I am here for you

## 2015-03-26 DIAGNOSIS — O99342 Other mental disorders complicating pregnancy, second trimester: Secondary | ICD-10-CM | POA: Insufficient documentation

## 2015-03-26 DIAGNOSIS — F329 Major depressive disorder, single episode, unspecified: Secondary | ICD-10-CM | POA: Insufficient documentation

## 2015-03-26 DIAGNOSIS — F32A Depression, unspecified: Secondary | ICD-10-CM | POA: Insufficient documentation

## 2015-03-26 DIAGNOSIS — O26891 Other specified pregnancy related conditions, first trimester: Secondary | ICD-10-CM | POA: Insufficient documentation

## 2015-03-26 DIAGNOSIS — O99281 Endocrine, nutritional and metabolic diseases complicating pregnancy, first trimester: Secondary | ICD-10-CM

## 2015-03-26 DIAGNOSIS — R12 Heartburn: Secondary | ICD-10-CM

## 2015-03-26 DIAGNOSIS — E079 Disorder of thyroid, unspecified: Secondary | ICD-10-CM | POA: Insufficient documentation

## 2015-03-26 NOTE — Assessment & Plan Note (Signed)
Ranitidine considered category B; avoid triggers if possible

## 2015-03-26 NOTE — Telephone Encounter (Signed)
Letter written, patient notified. She will call me back with the fax number.

## 2015-03-26 NOTE — Assessment & Plan Note (Signed)
Being managed by endocrinologist; she will call her endocrinologist today, as she considers what is best for her and her unborn child, whether to keep her pregnancy or abort; she will discuss medication options with her endocrinologist

## 2015-03-26 NOTE — Assessment & Plan Note (Signed)
Patient has talked with endocrinologist about her pregnancy, and medicine was switched, but she is currently not taking anything; I encouraged her to please call her endocrinologist to discuss medicine option best for her at this time

## 2015-03-26 NOTE — Assessment & Plan Note (Addendum)
Supportive listening provided; encouraged patient to talk more with her counselor, psychiatrist, thinking through what is best for her at this time, how her decision may affect her mental health not only now but also down the road; I wanted her to know she is free to talk with me about her thoughts and concerns without any fear of judgment; I am here to help her through this tough time; will keep her out of work for another two weeks, with close f/u; seek medical help for any of her conditions with me or specialist

## 2015-03-27 NOTE — Progress Notes (Signed)
   THERAPIST PROGRESS NOTE  Session Time: 60min  Participation Level: Active  Behavioral Response: CasualAlertDepressed  Type of Therapy: Individual Therapy  Treatment Goals addressed: Coping  Interventions: CBT, Motivational Interviewing, Solution Focused, Strength-based, Supportive, Family Systems and Reframing  Summary: Catherine Berg is a 34 y.o. female who presents with new symptoms of her diagnosis. Patient continues to be on FMLA. Patient denies wanting to work due to having small children and not having a babysitter. Patient unsure how she will survive financially without the assistance of her younger brother. Assisted with developing a list of coping skills. Practice using breathing techniques. Encouraged patient to take time to herself , such as long hot bath, meditation and yoga.   Suicidal/Homicidal: Nowithout intent/plan  Therapist Response: LCSW provided Patient with ongoing emotional support and encouragement.  Normalized her feelings.  Commended Patient on her progress and reinforced the importance of client staying focused on her own strengths and resources and resiliency. Processed various strategies for dealing with stressors.    Plan: Return again in 2 weeks.  Diagnosis: Axis I: Adjustment Disorder with Depressed Mood    Axis II: No diagnosis    Marinda Elkicole M Peacock 03/27/2015

## 2015-03-27 NOTE — Telephone Encounter (Signed)
Note faxed to [phone].

## 2015-04-02 ENCOUNTER — Ambulatory Visit: Payer: 59 | Admitting: Licensed Clinical Social Worker

## 2015-04-04 ENCOUNTER — Ambulatory Visit (INDEPENDENT_AMBULATORY_CARE_PROVIDER_SITE_OTHER): Payer: Medicaid Other

## 2015-04-04 ENCOUNTER — Ambulatory Visit (INDEPENDENT_AMBULATORY_CARE_PROVIDER_SITE_OTHER): Payer: 59 | Admitting: Psychiatry

## 2015-04-04 ENCOUNTER — Encounter: Payer: Self-pay | Admitting: Psychiatry

## 2015-04-04 ENCOUNTER — Ambulatory Visit (INDEPENDENT_AMBULATORY_CARE_PROVIDER_SITE_OTHER): Payer: 59 | Admitting: Obstetrics and Gynecology

## 2015-04-04 VITALS — BP 124/80 | HR 92 | Temp 97.6°F | Ht 62.0 in | Wt 226.0 lb

## 2015-04-04 VITALS — BP 136/94 | HR 86 | Wt 225.4 lb

## 2015-04-04 DIAGNOSIS — Z36 Encounter for antenatal screening of mother: Secondary | ICD-10-CM

## 2015-04-04 DIAGNOSIS — Z3490 Encounter for supervision of normal pregnancy, unspecified, unspecified trimester: Secondary | ICD-10-CM

## 2015-04-04 DIAGNOSIS — Z3687 Encounter for antenatal screening for uncertain dates: Secondary | ICD-10-CM

## 2015-04-04 DIAGNOSIS — R638 Other symptoms and signs concerning food and fluid intake: Secondary | ICD-10-CM

## 2015-04-04 DIAGNOSIS — Z331 Pregnant state, incidental: Secondary | ICD-10-CM | POA: Diagnosis not present

## 2015-04-04 DIAGNOSIS — Z113 Encounter for screening for infections with a predominantly sexual mode of transmission: Secondary | ICD-10-CM

## 2015-04-04 DIAGNOSIS — Z634 Disappearance and death of family member: Secondary | ICD-10-CM

## 2015-04-04 DIAGNOSIS — F4321 Adjustment disorder with depressed mood: Secondary | ICD-10-CM

## 2015-04-04 MED ORDER — VENLAFAXINE HCL ER 37.5 MG PO CP24: ORAL_CAPSULE | ORAL | Status: DC

## 2015-04-04 NOTE — Progress Notes (Addendum)
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 She is able to state that the Effexor has helped her mood. However she indicates that this month she has learned that she is pregnant. I discussed that ideally she needs to be off of these medications because of the risk of teratogenic side effects. Her thus to discontinue her hydroxyzine as well as the Effexor XR. She's going to take Effexor XR 37.5 mg for 3 days and then stop the medicine.  Discussed other ways to address her depressed mood. These would be therapy and  group activities. I also discussed the need to mobilize social supports and to develop a routine. Patient states she largely lives for her children and that once she is done address the needs of her children during the day and getting to school she goes home. She does state that her younger brother is a social support at times.  It appears from a 03/14/2015 telephone note with her primary care physician that the patient reported she was not taking the Effexor, Ambien or hydroxyzine.  Chief Complaint: Pregnant  Chief Complaint    Follow-up; Medication Refill; Other     Visit Diagnosis:     ICD-9-CM ICD-10-CM   1. Adjustment disorder with depressed mood 309.0 F43.21   2. Bereavement V62.82 Z63.4     Past Medical History:  Past Medical History  Diagnosis Date  . Anxiety   . Low serum vitamin D   . Hypertension   . Reflux   . Obesity   . Vitamin D deficiency disease   . Vitamin B12 deficiency   . IFG (impaired fasting glucose)   . Abnormal thyroid blood test   . 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 History of abnormal cervical Pap smear   . History of cervical polypectomy   . Pregnancy induced hypertension     with 1st pregnancy, normal pressure with 2nd  . Thyroid disease    . Graves disease     Past Surgical History  Procedure Laterality Date  . Cervical polypectomy    . Cesarean section      x 2   Family History:  Family History  Problem Relation Age of Onset  . Heart attack Mother 55  . Hypertension Mother   . Asthma Mother   . Cancer Mother     laryngeal  . Diabetes Mother     pre diabetic  . Heart disease Mother   . Mental illness Mother   . Alcohol abuse Mother   . Drug abuse Mother   . Depression Mother   . Anxiety disorder Mother   . Bipolar disorder Mother   . Learning disabilities Brother   . ADD / ADHD Brother   . Asthma Son   . Heart disease Maternal Grandmother     heart attack, pacemaker  . Heart attack Maternal Grandmother   . Hypertension Maternal Grandmother   . Hypertension Maternal Grandfather   . Heart disease Paternal Grandmother     couple of major open heart surgeries, leaking valves  . Hypertension Paternal Grandmother   . Hypertension Paternal Grandfather   . Hyperlipidemia Brother   . Alcohol abuse Father    Social History:  Social History   Social History  . Marital Status: Single    Spouse Name: N/A  . Number  of Children: N/A  . Years of Education: N/A   Social History Main Topics  . Smoking status: Never Smoker   . Smokeless tobacco: Never Used  . Alcohol Use: No  . Drug Use: No  . Sexual Activity: Yes    Birth Control/ Protection: None   Other Topics Concern  . None   Social History Narrative   Additional History:   Assessment:   Musculoskeletal: Strength & Muscle Tone: within normal limits Gait & Station: normal Patient leans: N/A  Psychiatric Specialty Exam: HPI  Review of Systems  Psychiatric/Behavioral: Positive for depression (patient complains of some anhedonia and some depression but states it improved on the Effexor). Negative for suicidal ideas, hallucinations, memory loss and substance abuse. The patient is not nervous/anxious and does not have insomnia.   All other  systems reviewed and are negative.   Blood pressure 124/80, pulse 92, temperature 97.6 F (36.4 C), temperature source Tympanic, height  (1.575 m), weight 226 lb (102.513 kg), last menstrual period 02/08/2015, SpO2 99 %.Body mass index is 41.33 kg/(m^2).  General Appearance: Neat and Well Groomed  Eye Contact:  Good  Speech:  Normal Rate  Volume:  Normal  Mood:  Better  Affect:  Congruent  Thought Process:  Linear and Logical  Orientation:  Full (Time, Place, and Person)  Thought Content:  Negative  Suicidal Thoughts:  No  Homicidal Thoughts:  No  Memory:  Immediate;   Good Recent;   Good Remote;   Good  Judgement:  Good  Insight:  Good  Psychomotor Activity:  Negative  Concentration:  Good  Recall:  Good  Fund of Knowledge: Good  Language: Good  Akathisia:  Negative  Handed:  Right  AIMS (if indicated):    Assets:  Communication Skills Desire for Improvement  ADL's:  Intact  Cognition: WNL  Sleep:  good   Is the patient at risk to self?  No. Has the patient been a risk to self in the past 6 months?  No. Has the patient been a risk to self within the distant past?  No. Is the patient a risk to others?  No. Has the patient been a risk to others in the past 6 months?  No. Has the patient been a risk to others within the distant past?  No.  Current Medications: Current Outpatient Prescriptions  Medication Sig Dispense Refill  . lactulose (CHRONULAC) 10 GM/15ML solution Take 15 mLs (10 g total) by mouth daily as needed for mild constipation. 236 mL 0  . methimazole (TAPAZOLE) 5 MG tablet Take 1 tablet (5 mg total) by mouth 3 (three) times daily. 90 tablet 0  . NIFEdipine (PROCARDIA-XL/ADALAT-CC/NIFEDICAL-XL) 30 MG 24 hr tablet Take 1 tablet (30 mg total) by mouth daily. Discontinue Amlodipine 30 tablet 0  . prenatal vitamin w/FE, FA (NATACHEW) 29-1 MG CHEW chewable tablet Chew 1 tablet by mouth daily at 12 noon.    . propylthiouracil (PTU) 50 MG tablet Take 50 mg by  mouth 3 (three) times daily.     Marland Kitchen venlafaxine XR (EFFEXOR XR) 37.5 MG 24 hr capsule One tablet for three days and then stop the medication. 3 capsule 0  . vitamin B-12 (CYANOCOBALAMIN) 500 MCG tablet Take 500 mcg by mouth daily.    Marland Kitchen VITAMIN D, CHOLECALCIFEROL, PO Take 1 capsule by mouth once a week.     No current facility-administered medications for this visit.    Medical Decision Making:  Established Problem, Stable/Improving (1) and Review of New  Medication or Change in Dosage (2)  Treatment Plan Summary:Medication management and Plan   Adjustment disorder with depressed mood-patient will continue with therapy to address this. He does report her response to the Effexor XR however given her recent disclosure of being pregnant we are going to discontinue this medication. Given the risk of withdrawal syndrome and going to ask her to taper it for 3 days at 37.5 mg daily and then stop the medication. Did discuss we could monitor her depression for any severe and he got to the point where she was not caring for herself and/or putting the baby at risk to because of worsening depression that other interventions might be assessed at that time (i.e. ECT, hospitalization).  Bereavement-therapy as discussed above.   Patient will continue therapy and follow-up with writer in one month.  Wallace Goinglton Clariza Sickman 04/04/2015, 10:33 AM

## 2015-04-04 NOTE — Patient Instructions (Signed)
STOP PSYCHIATRIC MEDICATIONS. VENLAFAXINE XR 37.5 x 3 days then stop it.

## 2015-04-04 NOTE — Progress Notes (Signed)
  Catherine Berg presents for NOB nurse interview visit. Confirmation of pregnancy at [facility] on 03/11/2015. LMP: 02/08/2015. EDD: 11/15/2015. G-3.  P-2002. Pregnancy education material explained and given. No cats in the home. NOB labs ordered. TSH/HbgA1c due to Increased BMI, HIV labs and Drug screen were explained optional and she could opt out of tests but did not decline. Drug screen ordered. PNV encouraged. NT to discuss with provider. Pt to [facility] of thyroid, pt has Graves Disease and they are following pt. Pt. To follow up with provider in 4 weeks for NOB physical.  All questions answered.  ZIKA EXPOSURE SCREEN:  The patient has not traveled to a [place] endemic area within the past 6 months, nor has she had unprotected sex with a partner who has travelled to a [place] endemic region within the past 6 months. The patient has been advised to notify us if these factors change any time during this current pregnancy, so adequate testing and monitoring can be initiated.

## 2015-04-04 NOTE — Patient Instructions (Signed)
Pregnancy and Zika Virus Disease Zika virus disease, or Zika, is an illness that can spread to people from mosquitoes that carry the virus. It may also spread from person to person through infected body fluids. Zika first occurred in [place], but recently it has spread to new areas. The virus occurs in tropical climates. The location of Zika continues to change. Most people who become infected with Zika virus do not develop serious illness. However, Zika may cause birth defects in an unborn baby whose mother is infected with the virus. It may also increase the risk of miscarriage. WHAT ARE THE SYMPTOMS OF ZIKA VIRUS DISEASE? In many cases, people who have been infected with Zika virus do not develop any symptoms. If symptoms appear, they usually start about a week after the person is infected. Symptoms are usually mild. They may include:  Fever.  Rash.  Red eyes.  Joint pain. HOW DOES ZIKA VIRUS DISEASE SPREAD? The main way that Zika virus spreads is through the bite of a certain type of mosquito. Unlike most types of mosquitos, which bite only at night, the type of mosquito that carries Zika virus bites both at night and during the day. Zika virus can also spread through sexual contact, through a blood transfusion, and from a mother to her baby before or during birth. Once you have had Zika virus disease, it is unlikely that you will get it again. CAN I PASS ZIKA TO MY BABY DURING PREGNANCY? Yes, Zika can pass from a mother to her baby before or during birth. WHAT PROBLEMS CAN ZIKA CAUSE FOR MY BABY? A woman who is infected with Zika virus while pregnant is at risk of having her baby born with a condition in which the brain or head is smaller than expected (microcephaly). Babies who have microcephaly can have developmental delays, seizures, hearing problems, and vision problems. Having Zika virus disease during pregnancy can also increase the risk of miscarriage. HOW CAN ZIKA VIRUS DISEASE BE  PREVENTED? There is no vaccine to prevent Zika. The best way to prevent the disease is to avoid infected mosquitoes and avoid exposure to body fluids that can spread the virus. Avoid any possible exposure to Zika by taking the following precautions. For women and their sex partners:  Avoid traveling to high-risk areas. The locations where Zika is being reported change often. To identify high-risk areas, check the CDC travel website: www.cdc.gov/zika/geo/index.html  If you or your sex partner must travel to a high-risk area, talk with a health care provider before and after traveling.  Take all precautions to avoid mosquito bites if you live in, or travel to, any of the high-risk areas. Insect repellents are safe to use during pregnancy.  Ask your health care provider when it is safe to have sexual contact. For women:  If you are pregnant or trying to become pregnant, avoid sexual contact with persons who may have been exposed to Zika virus, persons who have possible symptoms of Zika, or persons whose history you are unsure about. If you choose to have sexual contact with someone who may have been exposed to Zika virus, use condoms correctly during the entire duration of sexual activity, every time. Do not share sexual devices, as you may be exposed to body fluids.  Ask your health care provider about when it is safe to attempt pregnancy after a possible exposure to Zika virus. WHAT STEPS SHOULD I TAKE TO AVOID MOSQUITO BITES? Take these steps to avoid mosquito bites when you are   in a high-risk area:  Wear loose clothing that covers your arms and legs.  Limit your outdoor activities.  Do not open windows unless they have window screens.  Sleep under mosquito nets.  Use insect repellent. The best insect repellents have:  DEET, picaridin, oil of lemon eucalyptus (OLE), or IR3535 in them.  Higher amounts of an active ingredient in them.  Remember that insect repellents are safe to use  during pregnancy.  Do not use OLE on children who are younger than 3 years of age. Do not use insect repellent on babies who are younger than 2 months of age.  Cover your child's stroller with mosquito netting. Make sure the netting fits snugly and that any loose netting does not cover your child's mouth or nose. Do not use a blanket as a mosquito-protection cover.  Do not apply insect repellent underneath clothing.  If you are using sunscreen, apply the sunscreen before applying the insect repellent.  Treat clothing with permethrin. Do not apply permethrin directly to your skin. Follow label directions for safe use.  Get rid of standing water, where mosquitoes may reproduce. Standing water is often found in items such as buckets, bowls, animal food dishes, and flowerpots. When you return from traveling to any high-risk area, continue taking actions to protect yourself against mosquito bites for 3 weeks, even if you show no signs of illness. This will prevent spreading Zika virus to uninfected mosquitoes. WHAT SHOULD I KNOW ABOUT THE SEXUAL TRANSMISSION OF ZIKA? People can spread Zika to their sexual partners during vaginal, anal, or oral sex, or by sharing sexual devices. Many people with Zika do not develop symptoms, so a person could spread the disease without knowing that they are infected. The greatest risk is to women who are pregnant or who may become pregnant. Zika virus can live longer in semen than it can live in blood. Couples can prevent sexual transmission of the virus by:  Using condoms correctly during the entire duration of sexual activity, every time. This includes vaginal, anal, and oral sex.  Not sharing sexual devices. Sharing increases your risk of being exposed to body fluid from another person.  Avoiding all sexual activity until your health care provider says it is safe. SHOULD I BE TESTED FOR ZIKA VIRUS? A sample of your blood can be tested for Zika virus. A pregnant  woman should be tested if she may have been exposed to the virus or if she has symptoms of Zika. She may also have additional tests done during her pregnancy, such ultrasound testing. Talk with your health care provider about which tests are recommended.   This information is not intended to replace advice given to you by your health care provider. Make sure you discuss any questions you have with your health care provider.   Document Released: 01/09/2015 Document Reviewed: 01/02/2015 Elsevier Interactive Patient Education 2016 [employer] Minor Illnesses and Medications in Pregnancy  Cold/Flu:  Sudafed for congestion- Robitussin (plain) for cough- Tylenol for discomfort.  Please follow the directions on the label.  Try not to take any more than needed.  OTC Saline nasal spray and air humidifier or cool-mist  Vaporizer to sooth nasal irritation and to loosen congestion.  It is also important to increase intake of non carbonated fluids, especially if you have a fever.  Constipation:  Colace-2 capsules at bedtime; Metamucil- follow directions on label; Senokot- 1 tablet at bedtime.  Any one of these medications can be used.  It is also   very important to increase fluids and fruits along with regular exercise.  If problem persists please call the office.  Diarrhea:  Kaopectate as directed on the label.  Eat a bland diet and increase fluids.  Avoid highly seasoned foods.  Headache:  Tylenol 1 or 2 tablets every 3-4 hours as needed  Indigestion:  Maalox, Mylanta, Tums or Rolaids- as directed on label.  Also try to eat small meals and avoid fatty, greasy or spicy foods.  Nausea with or without Vomiting:  Nausea in pregnancy is caused by increased levels of hormones in the body which influence the digestive system and cause irritation when stomach acids accumulate.  Symptoms usually subside after 1st trimester of pregnancy.  Try the following:  Keep saltines, graham crackers or dry toast by your bed  to eat upon awakening.  Don't let your stomach get empty.  Try to eat 5-6 small meals per day instead of 3 large ones.  Avoid greasy fatty or highly seasoned foods.   Take OTC Unisom 1 tablet at bed time along with OTC Vitamin B6 25-50 mg 3 times per day.    If nausea continues with vomiting and you are unable to keep down food and fluids you may need a prescription medication.  Please notify your provider.   Sore throat:  Chloraseptic spray, throat lozenges and or plain Tylenol.  Vaginal Yeast Infection:  OTC Monistat for 7 days as directed on label.  If symptoms do not resolve within a week notify provider.  If any of the above problems do not subside with recommended treatment please call the office for further assistance.   Do not take Aspirin, Advil, Motrin or Ibuprofen.  * * OTC= Over the counter Commonly Asked Questions During Pregnancy  Cats: A parasite can be excreted in cat feces.  To avoid exposure you need to have another person empty the little box.  If you must empty the litter box you will need to wear gloves.  Wash your hands after handling your cat.  This parasite can also be found in raw or undercooked meat so this should also be avoided.  Colds, Sore Throats, Flu: Please check your medication sheet to see what you can take for symptoms.  If your symptoms are unrelieved by these medications please call the office.  Dental Work: Most any dental work your dentist recommends is permitted.  X-rays should only be taken during the first trimester if absolutely necessary.  Your abdomen should be shielded with a lead apron during all x-rays.  Please notify your provider prior to receiving any x-rays.  Novocaine is fine; gas is not recommended.  If your dentist requires a note from us prior to dental work please call the office and we will provide one for you.  Exercise: Exercise is an important part of staying healthy during your pregnancy.  You may continue most exercises you were  accustomed to prior to pregnancy.  Later in your pregnancy you will most likely notice you have difficulty with activities requiring balance like riding a bicycle.  It is important that you listen to your body and avoid activities that put you at a higher risk of falling.  Adequate rest and staying well hydrated are a must!  If you have questions about the safety of specific activities ask your provider.    Exposure to Children with illness: Try to avoid obvious exposure; report any symptoms to us when noted,  If you have chicken pos, red measles or mumps, you   should be immune to these diseases.   Please do not take any vaccines while pregnant unless you have checked with your OB provider.  Fetal Movement: After 28 weeks we recommend you do "kick counts" twice daily.  Lie or sit down in a calm quiet environment and count your baby movements "kicks".  You should feel your baby at least 10 times per hour.  If you have not felt 10 kicks within the first hour get up, walk around and have something sweet to eat or drink then repeat for an additional hour.  If count remains less than 10 per hour notify your provider.  Fumigating: Follow your pest control agent's advice as to how long to stay out of your home.  Ventilate the area well before re-entering.  Hemorrhoids:   Most over-the-counter preparations can be used during pregnancy.  Check your medication to see what is safe to use.  It is important to use a stool softener or fiber in your diet and to drink lots of liquids.  If hemorrhoids seem to be getting worse please call the office.   Hot Tubs:  Hot tubs Jacuzzis and saunas are not recommended while pregnant.  These increase your internal body temperature and should be avoided.  Intercourse:  Sexual intercourse is safe during pregnancy as long as you are comfortable, unless otherwise advised by your provider.  Spotting may occur after intercourse; report any bright red bleeding that is heavier than  spotting.  Labor:  If you know that you are in labor, please go to the hospital.  If you are unsure, please call the office and let us help you decide what to do.  Lifting, straining, etc:  If your job requires heavy lifting or straining please check with your provider for any limitations.  Generally, you should not lift items heavier than that you can lift simply with your hands and arms (no back muscles)  Painting:  Paint fumes do not harm your pregnancy, but may make you ill and should be avoided if possible.  Latex or water based paints have less odor than oils.  Use adequate ventilation while painting.  Permanents & Hair Color:  Chemicals in hair dyes are not recommended as they cause increase hair dryness which can increase hair loss during pregnancy.  " Highlighting" and permanents are allowed.  Dye may be absorbed differently and permanents may not hold as well during pregnancy.  Sunbathing:  Use a sunscreen, as skin burns easily during pregnancy.  Drink plenty of fluids; avoid over heating.  Tanning Beds:  Because their possible side effects are still unknown, tanning beds are not recommended.  Ultrasound Scans:  Routine ultrasounds are performed at approximately 20 weeks.  You will be able to see your baby's general anatomy an if you would like to know the gender this can usually be determined as well.  If it is questionable when you conceived you may also receive an ultrasound early in your pregnancy for dating purposes.  Otherwise ultrasound exams are not routinely performed unless there is a medical necessity.  Although you can request a scan we ask that you pay for it when conducted because insurance does not cover " patient request" scans.  Work: If your pregnancy proceeds without complications you may work until your due date, unless your physician or employer advises otherwise.  Round Ligament Pain/Pelvic Discomfort:  Sharp, shooting pains not associated with bleeding are fairly  common, usually occurring in the second trimester of pregnancy.  They   tend to be worse when standing up or when you remain standing for long periods of time.  These are the result of pressure of certain pelvic ligaments called "round ligaments".  Rest, Tylenol and heat seem to be the most effective relief.  As the womb and fetus grow, they rise out of the pelvis and the discomfort improves.  Please notify the office if your pain seems different than that described.  It may represent a more serious condition.   

## 2015-04-05 ENCOUNTER — Encounter: Payer: Self-pay | Admitting: Family Medicine

## 2015-04-05 ENCOUNTER — Ambulatory Visit (INDEPENDENT_AMBULATORY_CARE_PROVIDER_SITE_OTHER): Payer: 59 | Admitting: Family Medicine

## 2015-04-05 VITALS — BP 134/91 | HR 85 | Temp 98.1°F | Wt 226.0 lb

## 2015-04-05 DIAGNOSIS — E079 Disorder of thyroid, unspecified: Secondary | ICD-10-CM | POA: Diagnosis not present

## 2015-04-05 DIAGNOSIS — E0590 Thyrotoxicosis, unspecified without thyrotoxic crisis or storm: Secondary | ICD-10-CM | POA: Diagnosis not present

## 2015-04-05 DIAGNOSIS — F4329 Adjustment disorder with other symptoms: Secondary | ICD-10-CM

## 2015-04-05 DIAGNOSIS — O99281 Endocrine, nutritional and metabolic diseases complicating pregnancy, first trimester: Secondary | ICD-10-CM

## 2015-04-05 DIAGNOSIS — F4321 Adjustment disorder with depressed mood: Secondary | ICD-10-CM

## 2015-04-05 DIAGNOSIS — F4381 Prolonged grief disorder: Secondary | ICD-10-CM

## 2015-04-05 LAB — CBC WITH DIFFERENTIAL/PLATELET
Basophils Absolute: 0 10*3/uL (ref 0.0–0.2)
Basos: 0 %
EOS (ABSOLUTE): 0.1 10*3/uL (ref 0.0–0.4)
Eos: 1 %
Hematocrit: 35.4 % (ref 34.0–46.6)
Hemoglobin: 12.2 g/dL (ref 11.1–15.9)
Immature Grans (Abs): 0 10*3/uL (ref 0.0–0.1)
Immature Granulocytes: 0 %
Lymphocytes Absolute: 2.5 10*3/uL (ref 0.7–3.1)
Lymphs: 32 %
MCH: 28.5 pg (ref 26.6–33.0)
MCHC: 34.5 g/dL (ref 31.5–35.7)
MCV: 83 fL (ref 79–97)
Monocytes Absolute: 0.6 10*3/uL (ref 0.1–0.9)
Monocytes: 8 %
Neutrophils Absolute: 4.7 10*3/uL (ref 1.4–7.0)
Neutrophils: 59 %
Platelets: 310 10*3/uL (ref 150–379)
RBC: 4.28 x10E6/uL (ref 3.77–5.28)
RDW: 14.5 % (ref 12.3–15.4)
WBC: 8 10*3/uL (ref 3.4–10.8)

## 2015-04-05 LAB — PAIN MGT SCRN (14 DRUGS), UR
Amphetamine Screen, Ur: NEGATIVE ng/mL
Barbiturate Screen, Ur: NEGATIVE ng/mL
Benzodiazepine Screen, Urine: NEGATIVE ng/mL
Buprenorphine, Urine: NEGATIVE ng/mL
Cannabinoids Ur Ql Scn: NEGATIVE ng/mL
Cocaine(Metab.)Screen, Urine: NEGATIVE ng/mL
Creatinine(Crt), U: 202.7 mg/dL (ref 20.0–300.0)
Fentanyl, Urine: NEGATIVE pg/mL
Meperidine Screen, Urine: NEGATIVE ng/mL
Methadone Scn, Ur: NEGATIVE ng/mL
Opiate Scrn, Ur: NEGATIVE ng/mL
Oxycodone+Oxymorphone Ur Ql Scn: NEGATIVE ng/mL
PCP Scrn, Ur: NEGATIVE ng/mL
Ph of Urine: 5.9 (ref 4.5–8.9)
Propoxyphene, Screen: NEGATIVE ng/mL
Tramadol Ur Ql Scn: NEGATIVE ng/mL

## 2015-04-05 LAB — RUBELLA ANTIBODY, IGM: Rubella IgM: <20 AU/mL (ref 0.0–19.9)

## 2015-04-05 LAB — URINALYSIS, ROUTINE W REFLEX MICROSCOPIC
Bilirubin, UA: NEGATIVE
Glucose, UA: NEGATIVE
Ketones, UA: NEGATIVE
Leukocytes, UA: NEGATIVE
Nitrite, UA: NEGATIVE
RBC, UA: NEGATIVE
Specific Gravity, UA: >=1.03 — AB (ref 1.005–1.030)
Urobilinogen, Ur: 2 mg/dL — ABNORMAL HIGH (ref 0.2–1.0)
pH, UA: 6 (ref 5.0–7.5)

## 2015-04-05 LAB — RH TYPE: Rh Factor: POSITIVE

## 2015-04-05 LAB — AB SCR+ANTIBODY ID: Antibody Screen: POSITIVE — AB

## 2015-04-05 LAB — SICKLE CELL SCREEN: Sickle Cell Screen: NEGATIVE

## 2015-04-05 LAB — VARICELLA ZOSTER ANTIBODY, IGM: Varicella IgM: <0.91 index (ref 0.00–0.90)

## 2015-04-05 LAB — HEMOGLOBIN A1C
Est. average glucose Bld gHb Est-mCnc: 114 mg/dL
Hgb A1c MFr Bld: 5.6 % (ref 4.8–5.6)

## 2015-04-05 LAB — HEPATITIS B SURFACE ANTIGEN: Hepatitis B Surface Ag: NEGATIVE

## 2015-04-05 LAB — ANTIBODY SCREEN

## 2015-04-05 LAB — NICOTINE SCREEN, URINE: Cotinine Ql Scrn, Ur: NEGATIVE ng/mL

## 2015-04-05 LAB — HIV ANTIBODY (ROUTINE TESTING W REFLEX): HIV Screen 4th Generation wRfx: NONREACTIVE

## 2015-04-05 LAB — RPR: RPR Ser Ql: NONREACTIVE

## 2015-04-05 LAB — ABO

## 2015-04-05 NOTE — Progress Notes (Signed)
BP 134/91 mmHg  Pulse 85  Temp(Src) 98.1 F (36.7 C)  Wt 226 lb (102.513 kg)  SpO2 100%  LMP 02/08/2015 (Approximate)   Subjective:    Patient ID: Catherine Berg, female    DOB: Sep 01, 1980, 34 y.o.   MRN: [id number]  HPI: Catherine Berg is a 34 y.o. female  Chief Complaint  Patient presents with  . Depression   Depression screen [facility] 2/9 04/05/2015 03/22/2015 02/22/2015 02/01/2015 01/15/2015  Decreased Interest Down, Depressed, Hopeless PHQ - 2 Score Altered sleeping Tired, decreased energy Change in appetite Feeling bad or failure about yourself  Trouble concentrating Moving slowly or fidgety/restless 0 1 0 0 0  Suicidal thoughts 0 0 0 0 0  PHQ-9 Score Difficult doing work/chores Somewhat difficult Somewhat difficult Somewhat difficult - Somewhat difficult   Patient is here for follow-up of depression during pregnancy and Graves disease She saw the psychiatrist yesterday and he said that he can't medicate her with anything that is safe enough during her pregnancy; he is going to talk with Joni Reining, the therapist; they were going to work in some groups She went to [facility] and had her first visit there and they drew blood She is a little further along than suspected, about 2 months along with her pregnancy She is trying to figure out still if she is going to have an abortion or carry the pregnancy to term  She leaves her to have labs done for endocrinologist on Wednesday and will have labs done today and thyroid scan next week; they will know if the whole thyroid or one nodule is causing the problems  Relevant past medical, surgical, family and social history reviewed and updated as indicated. Interim medical history since our last visit reviewed. Allergies and medications reviewed and updated.  Review of Systems Per HPI unless specifically indicated above    Objective:    BP 134/91 mmHg  Pulse 85  Temp(Src) 98.1 F (36.7 C)  Wt 226 lb (102.513 kg)  SpO2 100%  LMP 02/08/2015 (Approximate)  Wt Readings from Last 3 Encounters:  04/05/15 226 lb (102.513 kg)  04/04/15 226 lb (102.513 kg)  04/04/15 225 lb 7 oz (102.258 kg)    Physical Exam  Constitutional: She appears well-developed and well-nourished. No distress.  Eyes: EOM are normal. No scleral icterus.  Neck: No thyromegaly present.  Cardiovascular: Normal rate.   Pulmonary/Chest: Effort normal.  Abdominal: She exhibits no distension.  Skin: No pallor.  Psychiatric: Her speech is normal and behavior is normal. Judgment and thought content normal. Her mood appears not anxious. Her affect is not angry, not blunt, not labile and not inappropriate. She is not slowed and not withdrawn. Cognition and memory are normal. She exhibits a depressed mood. She expresses no suicidal ideation.    Results for orders placed or performed in visit on 04/04/15  Culture, OB Urine  Result Value Ref Range   Urine Culture, OB Final report   GC/Chlamydia Probe Amp  Result Value Ref Range   Chlamydia trachomatis, NAA Negative Negative   Neisseria gonorrhoeae by PCR Negative Negative  Result  Result Value Ref Range   Result 1 Comment   ABO  Result Value Ref Range   ABO Grouping AB   CBC with Differential/Platelet  Result Value Ref Range   WBC 8.0 3.4 - 10.8 x10E3/uL   RBC 4.28 3.77 - 5.28 x10E6/uL   Hemoglobin 12.2 11.1 - 15.9 g/dL   Hematocrit 78.235.4 95.634.0 - 46.6 %   MCV 83 79 - 97 fL   MCH 28.5 26.6 - 33.0 pg   MCHC 34.5 31.5 - 35.7 g/dL   RDW 21.314.5 08.612.3 - 57.815.4 %   Platelets 310 150 - 379 x10E3/uL   Neutrophils 59 %   Lymphs 32 %   Monocytes 8 %   Eos 1 %   Basos 0 %   Neutrophils Absolute 4.7 1.4 - 7.0 x10E3/uL   Lymphocytes Absolute 2.5 0.7 - 3.1 x10E3/uL   Monocytes Absolute 0.6 0.1 - 0.9 x10E3/uL   EOS (ABSOLUTE) 0.1 0.0 - 0.4 x10E3/uL   Basophils Absolute 0.0 0.0 - 0.2 x10E3/uL    Immature Granulocytes 0 %   Immature Grans (Abs) 0.0 0.0 - 0.1 x10E3/uL  Hemoglobin A1c  Result Value Ref Range   Hgb A1c MFr Bld 5.6 4.8 - 5.6 %   Est. average glucose Bld gHb Est-mCnc 114 mg/dL  Hepatitis B surface antigen  Result Value Ref Range   Hepatitis B Surface Ag Negative Negative  HIV antibody  Result Value Ref Range   HIV Screen 4th Generation wRfx Non Reactive Non Reactive  Nicotine screen, urine  Result Value Ref Range   COTININE UR QL SCN Negative Cutoff=300 ng/mL   Drug Screen Comment: Comment   Pain Mgt Scrn (14 Drugs), Ur  Result Value Ref Range   Amphetamine Screen, Ur Negative Cutoff=1000 ng/mL   Barbiturate Screen, Ur Negative Cutoff=200 ng/mL   Benzodiazepine Screen, Urine Negative Cutoff=200 ng/mL   Cannabinoids Ur Ql Scn Negative Cutoff=20 ng/mL   Cocaine(Metab.)Screen, Urine Negative Cutoff=300 ng/mL   Opiate Scrn, Ur Negative Cutoff=300 ng/mL   Oxycodone+Oxymorphone Ur Ql Scn Negative Cutoff=100 ng/mL   PCP Scrn, Ur Negative Cutoff=25 ng/mL   Methadone Scn, Ur Negative Cutoff=300 ng/mL   Propoxyphene, Screen Negative Cutoff=300 ng/mL   Meperidine Screen, Urine Negative Cutoff=200 ng/mL   Fentanyl, Urine Negative Cutoff=2000 pg/mL   Tramadol Ur Ql Scn Negative Cutoff=200 ng/mL   Buprenorphine, Urine Negative Cutoff=10 ng/mL   Creatinine(Crt), U 202.7 20.0 - 300.0 mg/dL   Ph of Urine 5.9 4.5 - 8.9  Rh Type  Result Value Ref Range   Rh Factor Positive   RPR  Result Value Ref Range   RPR Ser Ql Non Reactive Non Reactive  Rubella antibody, IgM  Result Value Ref Range   Rubella IgM <20.0 0.0 - 19.9 AU/mL  Sickle cell screen  Result Value Ref Range   Sickle Cell Screen Negative Negative  Urinalysis, Routine w reflex microscopic ([facility])  Result Value Ref Range   Specific Gravity, UA      >=1.030 (A) 1.005 - 1.030   pH, UA 6.0 5.0 - 7.5   Color, UA Yellow Yellow   Appearance Ur Clear Clear   Leukocytes, UA Negative Negative   Protein, UA  Trace Negative/Trace   Glucose, UA Negative Negative   Ketones, UA Negative Negative   RBC, UA Negative Negative   Bilirubin, UA Negative Negative   Urobilinogen, Ur 2.0 (H) 0.2 - 1.0 mg/dL   Nitrite, UA Negative Negative   Microscopic Examination Comment   Varicella zoster antibody, IgM  Result Value Ref Range   Varicella IgM <0.91 0.00 - 0.90  index  Ab Scr+Antibody ID  Result Value Ref Range   Antibody Screen Positive (A) Negative   Antibody Id. #1 Anti-M    Coombs Titer #1 Comment   Antibody screen  Result Value Ref Range   Antibody Screen See Final Results Negative      Assessment & Plan:   Problem List Items Addressed This Visit      Endocrine   Hyperthyroidism    Following up with endocrinologist      Thyroid disease during pregnancy in first trimester    Discussed her pregnancy, thoughts of carrying versus termination; she will continue to see endocrinologist        Other   Prolonged grief reaction - Primary    She is seeing psychiatrist and psychologist; not currently on any medicines; will start working in group therapy; supportive listening provided; no thoughts of self-harm; call if needed; out of work as she struggles with depression, what to do with her pregnancy, prolonged grief over mother's passing         Follow up plan: Return in about 2 weeks (around 04/19/2015) for follow-up.  Face-to-face time with patient was more than 15 minutes, >50% time spent counseling and coordination of care

## 2015-04-05 NOTE — Patient Instructions (Addendum)
Please do call me if there is anything I can do Return in 2 weeks Work note -- out of work through the date of next appointment

## 2015-04-05 NOTE — Progress Notes (Signed)
I have reviewed the information as noted by Fenton Mallingebbie Ridgeway, LPN.  Patient also with elevated BP today, no prior h/o HTN. Will f/u at next visit and may need baseline PIH labs for underlying undiagnosed cHTN if elevated.

## 2015-04-06 LAB — CULTURE, OB URINE

## 2015-04-06 LAB — GC/CHLAMYDIA PROBE AMP
Chlamydia trachomatis, NAA: NEGATIVE
Neisseria gonorrhoeae by PCR: NEGATIVE

## 2015-04-06 LAB — URINE CULTURE, OB REFLEX

## 2015-04-09 ENCOUNTER — Telehealth: Payer: Self-pay | Admitting: Family Medicine

## 2015-04-09 ENCOUNTER — Encounter: Payer: Self-pay | Admitting: Family Medicine

## 2015-04-09 NOTE — Assessment & Plan Note (Signed)
Following up with endocrinologist.

## 2015-04-09 NOTE — Telephone Encounter (Signed)
Dr Sherie DonLada, how much longer do you want her to stay out of work?

## 2015-04-09 NOTE — Assessment & Plan Note (Signed)
She is seeing psychiatrist and psychologist; not currently on any medicines; will start working in group therapy; supportive listening provided; no thoughts of self-harm; call if needed; out of work as she struggles with depression, what to do with her pregnancy, prolonged grief over mother's passing

## 2015-04-09 NOTE — Assessment & Plan Note (Signed)
Discussed her pregnancy, thoughts of carrying versus termination; she will continue to see endocrinologist

## 2015-04-09 NOTE — Telephone Encounter (Signed)
Letter written and faxed per pt request to [facility] [employer] [facility], [facility] @ [phone].  Pt notified.

## 2015-04-09 NOTE — Telephone Encounter (Signed)
Through May 05, 2015; return to work May 06, 2015

## 2015-04-09 NOTE — Telephone Encounter (Signed)
Needs a letter sent to her employer extending her FMLA. Please call her to clarify.

## 2015-04-15 ENCOUNTER — Ambulatory Visit: Payer: 59 | Admitting: Licensed Clinical Social Worker

## 2015-04-19 ENCOUNTER — Ambulatory Visit: Payer: 59 | Admitting: Family Medicine

## 2015-04-26 ENCOUNTER — Encounter: Payer: Self-pay | Admitting: Family Medicine

## 2015-04-26 ENCOUNTER — Ambulatory Visit (INDEPENDENT_AMBULATORY_CARE_PROVIDER_SITE_OTHER): Payer: 59 | Admitting: Family Medicine

## 2015-04-26 VITALS — BP 138/76 | HR 110 | Temp 98.5°F | Ht 63.0 in | Wt 227.4 lb

## 2015-04-26 DIAGNOSIS — I10 Essential (primary) hypertension: Secondary | ICD-10-CM | POA: Diagnosis not present

## 2015-04-26 DIAGNOSIS — E0500 Thyrotoxicosis with diffuse goiter without thyrotoxic crisis or storm: Secondary | ICD-10-CM

## 2015-04-26 DIAGNOSIS — F4381 Prolonged grief disorder: Secondary | ICD-10-CM

## 2015-04-26 DIAGNOSIS — F4329 Adjustment disorder with other symptoms: Secondary | ICD-10-CM

## 2015-04-26 DIAGNOSIS — F4321 Adjustment disorder with depressed mood: Secondary | ICD-10-CM | POA: Diagnosis not present

## 2015-04-26 DIAGNOSIS — E538 Deficiency of other specified B group vitamins: Secondary | ICD-10-CM

## 2015-04-26 MED ORDER — AMLODIPINE BESYLATE 10 MG PO TABS: 10.0000 mg | ORAL_TABLET | Freq: Every day | ORAL | Status: DC

## 2015-04-26 MED ORDER — COMPLETENATE 29-1 MG PO CHEW: 1.0000 | CHEWABLE_TABLET | Freq: Every day | ORAL | Status: DC

## 2015-04-26 MED ORDER — FLUTICASONE PROPIONATE 50 MCG/ACT NA SUSP: 2.0000 | Freq: Every day | NASAL | Status: DC

## 2015-04-26 MED ORDER — ESCITALOPRAM OXALATE 10 MG PO TABS: 10.0000 mg | ORAL_TABLET | Freq: Every day | ORAL | Status: DC

## 2015-04-26 NOTE — Patient Instructions (Signed)
I am here for you Start back on the medicine Merry Christmas!!

## 2015-04-26 NOTE — Progress Notes (Signed)
BP 138/76 mmHg  Pulse 110  Temp(Src) 98.5 F (36.9 C)  Ht 5\' 3"  (1.6 m)  Wt 227 lb 6.4 oz (103.148 kg)  BMI 40.29 kg/m2  SpO2 96%  LMP 02/08/2015 (Approximate)   Subjective:    Patient ID: Catherine Berg, female    DOB: 06/19/1980, 34 y.o.   MRN: [id number]  HPI: Catherine Berg is a 34 y.o. female  Chief Complaint  Patient presents with  . Follow-up  . Depression    Patient states she seems better.    She is back on her blood pressure medicine and the vit D and B12 and methimazole (I updated her med list) Still having some clots after her abortion; she went to a clinic in [place] and used medicine; she had some pretty significant pain and cramping and then passed the fetus and felt much better physically within minutes; currently, she has no abdominal pain, no fevers; she goes back to them Dec 27th or 28th; she discussed some of the issues related to that; her daughter was here and we talked privately with daughter out of the room for this matter Her mood is improving; she is not taking anything currently and wonders if she should get back on the SSRI; she has not seen her psychiatrist since the abortion  Relevant past medical, surgical, family and social history reviewed Interim medical history since our last visit reviewed Allergies and medications reviewed and updated  Review of Systems Per HPI unless specifically indicated above     Objective:    BP 138/76 mmHg  Pulse 110  Temp(Src) 98.5 F (36.9 C)  Ht 5\' 3"  (1.6 m)  Wt 227 lb 6.4 oz (103.148 kg)  BMI 40.29 kg/m2  SpO2 96%  LMP 02/08/2015 (Approximate)  Wt Readings from Last 3 Encounters:  04/26/15 227 lb 6.4 oz (103.148 kg)  04/05/15 226 lb (102.513 kg)  04/04/15 226 lb (102.513 kg)    Physical Exam  Constitutional: She appears well-developed and well-nourished.  Cardiovascular: Tachycardia present.   Pulmonary/Chest: Effort normal.  Psychiatric: Her speech is normal and behavior is normal. Judgment and  thought content normal. Her mood appears not anxious. Her affect is not blunt and not inappropriate. Cognition and memory are normal. She does not exhibit a depressed mood.  Good eye contact with examiner      Assessment & Plan:   Problem List Items Addressed This Visit      Cardiovascular and Mediastinum   Essential hypertension, benign - Primary    Controlled today; her stress level is better now as well      Relevant Medications   amLODipine (NORVASC) 10 MG tablet     Digestive   Vitamin B12 deficiency    Continue supplementation        Endocrine   Graves' disease    Back on methimazole, seeing endocrinologist        Other   Prolonged grief reaction    Patient will start back on the SSRI, no longer pregnant; she will f/u with psychiatrist and counselor but will call me before next appt here if needed         Follow up plan: Return in about 3 months (around 07/25/2015) for follow-up.  An after-visit summary was printed and given to the patient at check-out.  Please see the patient instructions which may contain other information and recommendations beyond what is mentioned above in the assessment and plan. Meds ordered this encounter  Medications  . DISCONTD: propylthiouracil (  PTU) 50 MG tablet    Sig: Take by mouth.  Marland Kitchen amLODipine (NORVASC) 10 MG tablet    Sig: Take 1 tablet (10 mg total) by mouth daily.    Dispense:  30 tablet    Refill:  6    Pharmacist - cancel nifedipine  . prenatal vitamin w/FE, FA (NATACHEW) 29-1 MG CHEW chewable tablet    Sig: Chew 1 tablet by mouth daily at 12 noon.    Dispense:  30 tablet    Refill:  11  . escitalopram (LEXAPRO) 10 MG tablet    Sig: Take 1 tablet (10 mg total) by mouth daily.    Dispense:  30 tablet    Refill:  1  . fluticasone (FLONASE) 50 MCG/ACT nasal spray    Sig: Place 2 sprays into both nostrils daily.    Dispense:  16 g    Refill:  6   Medications Discontinued During This Encounter  Medication Reason  .  propylthiouracil (PTU) 50 MG tablet Patient Preference  . propylthiouracil (PTU) 50 MG tablet Patient Preference  . NIFEdipine (PROCARDIA-XL/ADALAT-CC/NIFEDICAL-XL) 30 MG 24 hr tablet Change in therapy  . prenatal vitamin w/FE, FA (NATACHEW) 29-1 MG CHEW chewable tablet Reorder  . prenatal vitamin w/FE, FA (NATACHEW) 29-1 MG CHEW chewable tablet Reorder   Face-to-face time with patient was more than 25 minutes, >50% time spent counseling and coordination of care

## 2015-04-29 ENCOUNTER — Encounter: Payer: Self-pay | Admitting: *Deleted

## 2015-04-29 ENCOUNTER — Emergency Department
Admission: EM | Admit: 2015-04-29 | Discharge: 2015-04-30 | Disposition: A | Discharge status: Home / Self Care | Payer: Medicaid Other | Attending: Emergency Medicine | Admitting: Emergency Medicine

## 2015-04-29 DIAGNOSIS — Z91040 Latex allergy status: Secondary | ICD-10-CM | POA: Insufficient documentation

## 2015-04-29 DIAGNOSIS — T7840XA Allergy, unspecified, initial encounter: Secondary | ICD-10-CM | POA: Diagnosis present

## 2015-04-29 DIAGNOSIS — L500 Allergic urticaria: Secondary | ICD-10-CM | POA: Diagnosis not present

## 2015-04-29 DIAGNOSIS — L299 Pruritus, unspecified: Secondary | ICD-10-CM | POA: Diagnosis not present

## 2015-04-29 DIAGNOSIS — T450X5A Adverse effect of antiallergic and antiemetic drugs, initial encounter: Secondary | ICD-10-CM | POA: Insufficient documentation

## 2015-04-29 DIAGNOSIS — I10 Essential (primary) hypertension: Secondary | ICD-10-CM | POA: Diagnosis not present

## 2015-04-29 DIAGNOSIS — Z7952 Long term (current) use of systemic steroids: Secondary | ICD-10-CM | POA: Diagnosis not present

## 2015-04-29 DIAGNOSIS — Z79899 Other long term (current) drug therapy: Secondary | ICD-10-CM | POA: Diagnosis not present

## 2015-04-29 DIAGNOSIS — L509 Urticaria, unspecified: Secondary | ICD-10-CM

## 2015-04-29 MED ORDER — FAMOTIDINE IN NACL 20-0.9 MG/50ML-% IV SOLN
INTRAVENOUS | Status: AC
  Administered 2015-04-29: 20 mg via INTRAVENOUS
  Filled 2015-04-29: qty 50

## 2015-04-29 MED ORDER — METHYLPREDNISOLONE SODIUM SUCC 125 MG IJ SOLR
125.0000 mg | Freq: Once | INTRAMUSCULAR | Status: AC
  Administered 2015-04-29: 125 mg via INTRAVENOUS

## 2015-04-29 MED ORDER — METHYLPREDNISOLONE SODIUM SUCC 125 MG IJ SOLR
INTRAMUSCULAR | Status: AC
  Administered 2015-04-29: 125 mg via INTRAVENOUS
  Filled 2015-04-29: qty 2

## 2015-04-29 MED ORDER — SODIUM CHLORIDE 0.9 % IV BOLUS (SEPSIS)
1000.0000 mL | Freq: Once | INTRAVENOUS | Status: AC
  Administered 2015-04-29: 1000 mL via INTRAVENOUS

## 2015-04-29 MED ORDER — FAMOTIDINE IN NACL 20-0.9 MG/50ML-% IV SOLN
20.0000 mg | Freq: Once | INTRAVENOUS | Status: AC
  Administered 2015-04-29: 20 mg via INTRAVENOUS

## 2015-04-29 MED ORDER — HYDROXYZINE HCL 25 MG PO TABS
50.0000 mg | ORAL_TABLET | Freq: Once | ORAL | Status: AC
  Administered 2015-04-29: 50 mg via ORAL
  Filled 2015-04-29: qty 2

## 2015-04-29 MED ORDER — DIPHENHYDRAMINE HCL 50 MG/ML IJ SOLN
25.0000 mg | Freq: Once | INTRAMUSCULAR | Status: AC
  Administered 2015-04-29: 25 mg via INTRAVENOUS

## 2015-04-29 MED ORDER — DIPHENHYDRAMINE HCL 50 MG/ML IJ SOLN
INTRAMUSCULAR | Status: AC
  Administered 2015-04-29: 25 mg via INTRAVENOUS
  Filled 2015-04-29: qty 1

## 2015-04-29 NOTE — Assessment & Plan Note (Signed)
Patient will start back on the SSRI, no longer pregnant; she will f/u with psychiatrist and counselor but will call me before next appt here if needed

## 2015-04-29 NOTE — ED Notes (Addendum)
Patient noticed rash and pruritus today around 1830 on forearms and now has spread. Patient states she ate wheat bread today which she normally doesn't do. Patient states she took two Benadryl around [id number].

## 2015-04-29 NOTE — Assessment & Plan Note (Signed)
Continue supplementation  ?

## 2015-04-29 NOTE — Assessment & Plan Note (Addendum)
Controlled today; her stress level is better now as well; now that she is no longer pregnant, will have her use amlodipine; other Rx cancelled, note to pharmacist

## 2015-04-29 NOTE — Assessment & Plan Note (Signed)
Back on methimazole, seeing endocrinologist

## 2015-04-30 MED ORDER — FAMOTIDINE 40 MG PO TABS: 40.0000 mg | ORAL_TABLET | Freq: Every evening | ORAL | Status: DC

## 2015-04-30 MED ORDER — HYDROXYZINE PAMOATE 25 MG PO CAPS: 25.0000 mg | ORAL_CAPSULE | Freq: Three times a day (TID) | ORAL | Status: DC | PRN

## 2015-04-30 MED ORDER — PREDNISONE 20 MG PO TABS: 60.0000 mg | ORAL_TABLET | Freq: Every day | ORAL | Status: DC

## 2015-04-30 NOTE — Discharge Instructions (Signed)
Hives Hives are itchy, red, swollen areas of the skin. They can vary in size and location on your body. Hives can come and go for hours or several days (acute hives) or for several weeks (chronic hives). Hives do not spread from person to person (noncontagious). They may get worse with scratching, exercise, and emotional stress. CAUSES   Allergic reaction to food, additives, or drugs.  Infections, including the common cold.  Illness, such as vasculitis, lupus, or thyroid disease.  Exposure to sunlight, heat, or cold.  Exercise.  Stress.  Contact with chemicals. SYMPTOMS   Red or white swollen patches on the skin. The patches may change size, shape, and location quickly and repeatedly.  Itching.  Swelling of the hands, feet, and face. This may occur if hives develop deeper in the skin. DIAGNOSIS  Your caregiver can usually tell what is wrong by performing a physical exam. Skin or blood tests may also be done to determine the cause of your hives. In some cases, the cause cannot be determined. TREATMENT  Mild cases usually get better with medicines such as antihistamines. Severe cases may require an emergency epinephrine injection. If the cause of your hives is known, treatment includes avoiding that trigger.  HOME CARE INSTRUCTIONS   Avoid causes that trigger your hives.  Take antihistamines as directed by your caregiver to reduce the severity of your hives. Non-sedating or low-sedating antihistamines are usually recommended. Do not drive while taking an antihistamine.  Take any other medicines prescribed for itching as directed by your caregiver.  Wear loose-fitting clothing.  Keep all follow-up appointments as directed by your caregiver. SEEK MEDICAL CARE IF:   You have persistent or severe itching that is not relieved with medicine.  You have painful or swollen joints. SEEK IMMEDIATE MEDICAL CARE IF:   You have a fever.  Your tongue or lips are swollen.  You have  trouble breathing or swallowing.  You feel tightness in the throat or chest.  You have abdominal pain. These problems may be the first sign of a life-threatening allergic reaction. Call your local emergency services (911 in [place] MAKE SURE YOU:   Understand these instructions.  Will watch your condition.  Will get help right away if you are not doing well or get worse.   This information is not intended to replace advice given to you by your health care provider. Make sure you discuss any questions you have with your health care provider.   Document Released: 04/20/2005 Document Revised: 04/25/2013 Document Reviewed: 07/14/2011 Elsevier Interactive Patient Education 2016 [employer]  Pruritus Pruritus is an itching feeling. There are many different conditions and factors that can make your skin itchy. Dry skin is one of the most common causes of itching. Most cases of itching do not require medical attention. Itchy skin can turn into a rash.  HOME CARE INSTRUCTIONS  Watch your pruritus for any changes. Take these steps to help with your condition:  Skin Care  Moisturize your skin as needed. A moisturizer that contains petroleum jelly is best for keeping moisture in your skin.  Take or apply medicines only as directed by your health care provider. This may include:  Corticosteroid cream.  Anti-itch lotions.  Oral anti-histamines.  Apply cool compresses to the affected areas.  Try taking a bath with:  Epsom salts. Follow the instructions on the packaging. You can get these at your local pharmacy or grocery store.  Baking soda. Pour a small amount into the bath as directed  by your health care provider.  Colloidal oatmeal. Follow the instructions on the packaging. You can get this at your local pharmacy or grocery store.  Try applying baking soda paste to your skin. Stir water into baking soda until it reaches a paste-like consistency.   Do not scratch your  skin.  Avoid hot showers or baths, which can make itching worse. A cold shower may help with itching as long as you use a moisturizer after.  Avoid scented soaps, detergents, and perfumes. Use gentle soaps, detergents, perfumes, and other cosmetic products. General Instructions  Avoid wearing tight clothes.  Keep a journal to help track what causes your itch. Write down:  What you eat.  What cosmetic products you use.  What you drink.  What you wear. This includes jewelry.  Use a humidifier. This keeps the air moist, which helps to prevent dry skin. SEEK MEDICAL CARE IF:  The itching does not go away after several days.  You sweat at night.  You have weight loss.  You are unusually thirsty.  You urinate more than normal.  You are more tired than normal.  You have abdominal pain.  Your skin tingles.  You feel weak.  Your skin or the whites of your eyes look yellow (jaundice).  Your skin feels numb.   This information is not intended to replace advice given to you by your health care provider. Make sure you discuss any questions you have with your health care provider.   Document Released: 12/31/2010 Document Revised: 09/04/2014 Document Reviewed: 04/16/2014 Elsevier Interactive Patient Education [employer]

## 2015-04-30 NOTE — ED Provider Notes (Signed)
[facility] Emergency Department Provider Note  ____________________________________________  Time seen: Approximately 2316 AM  I have reviewed the triage vital signs and the nursing notes.   HISTORY  Chief Complaint Allergic Reaction    HPI Catherine Berg is a 34 y.o. female who comes into the hospital today having allergic reaction. The patient reports the symptoms started at 6:30. She noticed some bumps on her arms and itching.The patient reports that the symptoms persisted so she took some Benadryl at 7:30. After taking the Benadryl the patient reports the hives appeared and the itching became worse. The patient reports that she's had allergic reactions before but this time her hands up and swollen and her feet up and itchy. The patient reports that the only thing different she ate today was wheat bread. The patient reports that she ate at around 2 PM. The patient did receive some medication when she arrived but she reports that she still itchy and the hives are still present. She feels as though there may be a lump in her throat but is having no difficulty breathing.   Past Medical History  Diagnosis Date  . Anxiety   . Low serum vitamin D   . Hypertension   . Reflux   . Obesity   . Vitamin D deficiency disease   . Vitamin B12 deficiency   . IFG (impaired fasting glucose)   . Abnormal thyroid blood test   . 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 History of abnormal cervical Pap smear   . History of cervical polypectomy   . Pregnancy induced hypertension     with 1st pregnancy, normal pressure with 2nd  . Thyroid disease   . Graves disease     Patient Active Problem List   Diagnosis Date Noted  . Graves' disease 03/26/2015  . Thyroid nodule 02/01/2015  . Hyperthyroidism 12/25/2014  . 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 Essential hypertension, benign 12/24/2014  . Obesity   . Vitamin D deficiency disease   . Vitamin  B12 deficiency   . IFG (impaired fasting glucose)   . History of abnormal cervical Pap smear   . Breast pain, right 07/30/2014  . 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 Cervical polypectomy    . Cesarean section      x 2  . Colon surgery      Current Outpatient Rx  Name  Route  Sig  Dispense  Refill  . amLODipine (NORVASC) 10 MG tablet   Oral   Take 1 tablet (10 mg total) by mouth daily.   30 tablet   6     Pharmacist - cancel nifedipine   . escitalopram (LEXAPRO) 10 MG tablet   Oral   Take 1 tablet (10 mg total) by mouth daily.   30 tablet   1   . famotidine (PEPCID) 40 MG tablet   Oral   Take 1 tablet (40 mg total) by mouth every evening.   7 tablet   0   . fluticasone (FLONASE) 50 MCG/ACT nasal spray   Each Nare   Place 2 sprays into both nostrils daily.   16 g   6   . hydrOXYzine (VISTARIL) 25 MG capsule   Oral   Take 1 capsule (25 mg total) by mouth 3 (three) times daily as needed.   15 capsule   0   . lactulose (CHRONULAC) 10 GM/15ML solution   Oral  Take 15 mLs (10 g total) by mouth daily as needed for mild constipation.   236 mL   0   . methimazole (TAPAZOLE) 5 MG tablet   Oral   Take 1 tablet (5 mg total) by mouth 3 (three) times daily.   90 tablet   0   . predniSONE (DELTASONE) 20 MG tablet   Oral   Take 3 tablets (60 mg total) by mouth daily.   12 tablet   0   . prenatal vitamin w/FE, FA (NATACHEW) 29-1 MG CHEW chewable tablet   Oral   Chew 1 tablet by mouth daily at 12 noon.   30 tablet   11   . vitamin B-12 (CYANOCOBALAMIN) 500 MCG tablet   Oral   Take 500 mcg by mouth daily.         Marland Kitchen VITAMIN D, CHOLECALCIFEROL, PO   Oral   Take 1 capsule by mouth once a week.           Allergies Latex and Shellfish allergy  Family History  Problem Relation Age of Onset  . Heart attack Mother 54  . Hypertension Mother   . Asthma Mother   . Cancer Mother     laryngeal  . Diabetes  Mother     pre diabetic  . Heart disease Mother   . Mental illness Mother   . Alcohol abuse Mother   . Drug abuse Mother   . Depression Mother   . Anxiety disorder Mother   . Bipolar disorder Mother   . Learning disabilities Brother   . ADD / ADHD Brother   . Asthma Son   . Heart disease Maternal Grandmother     heart attack, pacemaker  . Heart attack Maternal Grandmother   . Hypertension Maternal Grandmother   . Hypertension Maternal Grandfather   . Heart disease Paternal Grandmother     couple of major open heart surgeries, leaking valves  . Hypertension Paternal Grandmother   . Hypertension Paternal Grandfather   . Hyperlipidemia Brother   . Alcohol abuse Father     Social History Social History  Substance Use Topics  . Smoking status: Never Smoker   . Smokeless tobacco: Never Used  . Alcohol Use: No    Review of Systems Constitutional: No fever/chills Eyes: No visual changes. ENT: No sore throat. Cardiovascular: Denies chest pain. Respiratory: Denies shortness of breath. Gastrointestinal: No abdominal pain.  No nausea, no vomiting.  No diarrhea.  No constipation. Genitourinary: Negative for dysuria. Musculoskeletal: Negative for back pain. Skin: Itching and hives Neurological: Negative for headaches, focal weakness or numbness.  10-point ROS otherwise negative.  ____________________________________________   PHYSICAL EXAM:  VITAL SIGNS: ED Triage Vitals  Enc Vitals Group     BP 04/29/15 2243 168/87 mmHg     Pulse Rate 04/29/15 2243 115     Resp 04/29/15 2243 18     Temp 04/29/15 2243 97.5 F (36.4 C)     Temp Source 04/29/15 2243 Oral     SpO2 04/29/15 2243 96 %     Weight 04/29/15 2243 213 lb (96.616 kg)     Height 04/29/15 2243  (1.6 m)     Head Cir --      Peak Flow --      Pain Score --      Pain Loc --      Pain Edu? --      Excl. in GC? --     Constitutional: Alert and oriented. Well  appearing and in no acute distress. Eyes:  Conjunctivae are normal. PERRL. EOMI. Head: Atraumatic. Nose: No congestion/rhinnorhea. Mouth/Throat: Mucous membranes are moist.  Oropharynx non-erythematous. Cardiovascular: Normal rate, regular rhythm. Grossly normal heart sounds.  Good peripheral circulation. Respiratory: Normal respiratory effort.  No retractions. Lungs CTAB. Gastrointestinal: Soft and nontender. No distention. No abdominal bruits. No CVA tenderness. Musculoskeletal: No lower extremity tenderness nor edema.  No joint effusions. Neurologic:  Normal speech and language.  Skin:  Skin is warm, dry and intact. Hives and mild erythema to bilateral arms Psychiatric: Mood and affect are normal.  ____________________________________________   LABS (all labs ordered are listed, but only abnormal results are displayed)  Labs Reviewed - No data to display ____________________________________________  EKG  none ____________________________________________  RADIOLOGY  none ____________________________________________   PROCEDURES  Procedure(s) performed: None  Critical Care performed: No  ____________________________________________   INITIAL IMPRESSION / ASSESSMENT AND PLAN / ED COURSE  Pertinent labs & imaging results that were available during my care of the patient were reviewed by me and considered in my medical decision making (see chart for details).  This is a 34 year old female who comes into the hospital today with an allergic reaction. The patient did receive some Solu-Medrol and Pepcid as well as another dose of Benadryl here in the emergency department. Since she is still itching I will give her 50 of hydroxyzine and 1 L of normal saline. I will reassess the patient when she's receive her medication.  The patient's itching is improved. She does still have some swelling to her hands but that will take some time to improve. I will discharge the patient home and have her follow-up with her primary care  physician. The patient understands and agrees with the plan as stated. ____________________________________________   FINAL CLINICAL IMPRESSION(S) / ED DIAGNOSES  Final diagnoses:  Allergic reaction, initial encounter  Hives  Itching      Rebecka Apley, MD 04/30/15 [phone]

## 2015-05-01 ENCOUNTER — Encounter: Payer: Self-pay | Admitting: *Deleted

## 2015-05-01 ENCOUNTER — Emergency Department
Admission: EM | Admit: 2015-05-01 | Discharge: 2015-05-01 | Disposition: A | Discharge status: Home / Self Care | Payer: Medicaid Other | Attending: Emergency Medicine | Admitting: Emergency Medicine

## 2015-05-01 DIAGNOSIS — Z7952 Long term (current) use of systemic steroids: Secondary | ICD-10-CM | POA: Diagnosis not present

## 2015-05-01 DIAGNOSIS — X58XXXD Exposure to other specified factors, subsequent encounter: Secondary | ICD-10-CM | POA: Insufficient documentation

## 2015-05-01 DIAGNOSIS — R21 Rash and other nonspecific skin eruption: Secondary | ICD-10-CM | POA: Insufficient documentation

## 2015-05-01 DIAGNOSIS — Z91040 Latex allergy status: Secondary | ICD-10-CM | POA: Insufficient documentation

## 2015-05-01 DIAGNOSIS — T781XXD Other adverse food reactions, not elsewhere classified, subsequent encounter: Secondary | ICD-10-CM | POA: Diagnosis present

## 2015-05-01 DIAGNOSIS — L299 Pruritus, unspecified: Secondary | ICD-10-CM | POA: Diagnosis not present

## 2015-05-01 DIAGNOSIS — L509 Urticaria, unspecified: Secondary | ICD-10-CM

## 2015-05-01 DIAGNOSIS — L500 Allergic urticaria: Secondary | ICD-10-CM | POA: Diagnosis not present

## 2015-05-01 DIAGNOSIS — Z79899 Other long term (current) drug therapy: Secondary | ICD-10-CM | POA: Diagnosis not present

## 2015-05-01 DIAGNOSIS — I10 Essential (primary) hypertension: Secondary | ICD-10-CM | POA: Insufficient documentation

## 2015-05-01 DIAGNOSIS — Z7951 Long term (current) use of inhaled steroids: Secondary | ICD-10-CM | POA: Diagnosis not present

## 2015-05-01 LAB — CBC WITH DIFFERENTIAL/PLATELET
Basophils Absolute: 0 10*3/uL (ref 0–0.1)
Basophils Relative: 0 %
Eosinophils Absolute: 0 10*3/uL (ref 0–0.7)
Eosinophils Relative: 0 %
HCT: 39.6 % (ref 35.0–47.0)
Hemoglobin: 13 g/dL (ref 12.0–16.0)
Lymphocytes Relative: 17 %
Lymphs Abs: 2.8 10*3/uL (ref 1.0–3.6)
MCH: 27.6 pg (ref 26.0–34.0)
MCHC: 32.9 g/dL (ref 32.0–36.0)
MCV: 83.8 fL (ref 80.0–100.0)
Monocytes Absolute: 0.3 10*3/uL (ref 0.2–0.9)
Monocytes Relative: 2 %
Neutro Abs: 12.8 10*3/uL — ABNORMAL HIGH (ref 1.4–6.5)
Neutrophils Relative %: 81 %
Platelets: 388 10*3/uL (ref 150–440)
RBC: 4.73 MIL/uL (ref 3.80–5.20)
RDW: 14.7 % — ABNORMAL HIGH (ref 11.5–14.5)
WBC: 15.9 10*3/uL — ABNORMAL HIGH (ref 3.6–11.0)

## 2015-05-01 LAB — T4, FREE: Free T4: 1.16 ng/dL — ABNORMAL HIGH (ref 0.61–1.12)

## 2015-05-01 LAB — BASIC METABOLIC PANEL
Anion gap: 9 (ref 5–15)
BUN: 15 mg/dL (ref 6–20)
CO2: 23 mmol/L (ref 22–32)
Calcium: 9.1 mg/dL (ref 8.9–10.3)
Chloride: 105 mmol/L (ref 101–111)
Creatinine, Ser: 0.69 mg/dL (ref 0.44–1.00)
GFR calc Af Amer: >60 mL/min (ref >60)
GFR calc non Af Amer: >60 mL/min (ref >60)
Glucose, Bld: 171 mg/dL — ABNORMAL HIGH (ref 65–99)
Potassium: 3.6 mmol/L (ref 3.5–5.1)
Sodium: 137 mmol/L (ref 135–145)

## 2015-05-01 LAB — TSH: TSH: 0.327 u[IU]/mL — ABNORMAL LOW (ref 0.350–4.500)

## 2015-05-01 MED ORDER — HYDROXYZINE HCL 10 MG PO TABS: 10.0000 mg | ORAL_TABLET | Freq: Once | ORAL | Status: DC

## 2015-05-01 MED ORDER — FAMOTIDINE IN NACL 20-0.9 MG/50ML-% IV SOLN
INTRAVENOUS | Status: AC
  Administered 2015-05-01: 20 mg
  Filled 2015-05-01: qty 50

## 2015-05-01 MED ORDER — METHYLPREDNISOLONE SODIUM SUCC 125 MG IJ SOLR
INTRAMUSCULAR | Status: AC
  Administered 2015-05-01: 125 mg
  Filled 2015-05-01: qty 2

## 2015-05-01 MED ORDER — EPINEPHRINE 0.3 MG/0.3ML IJ SOAJ
0.3000 mg | Freq: Once | INTRAMUSCULAR | Status: AC
  Administered 2015-05-01: 0.3 mg via INTRAMUSCULAR
  Filled 2015-05-01: qty 0.3

## 2015-05-01 MED ORDER — HYDROXYZINE HCL 25 MG PO TABS
ORAL_TABLET | ORAL | Status: AC
  Administered 2015-05-01: 25 mg via ORAL
  Filled 2015-05-01: qty 1

## 2015-05-01 MED ORDER — SODIUM CHLORIDE 0.9 % IV SOLN
Freq: Once | INTRAVENOUS | Status: AC
  Administered 2015-05-01: 09:00:00 via INTRAVENOUS

## 2015-05-01 MED ORDER — HYDROXYZINE HCL 25 MG PO TABS
25.0000 mg | ORAL_TABLET | Freq: Once | ORAL | Status: AC
  Administered 2015-05-01: 25 mg via ORAL

## 2015-05-01 MED ORDER — SODIUM CHLORIDE 0.9 % IV BOLUS (SEPSIS)
1000.0000 mL | Freq: Once | INTRAVENOUS | Status: AC
  Administered 2015-05-01: 1000 mL via INTRAVENOUS

## 2015-05-01 MED ORDER — DIPHENHYDRAMINE HCL 50 MG/ML IJ SOLN
25.0000 mg | Freq: Once | INTRAMUSCULAR | Status: AC
  Administered 2015-05-01: 25 mg via INTRAVENOUS
  Filled 2015-05-01: qty 1

## 2015-05-01 MED ORDER — EPINEPHRINE 0.3 MG/0.3ML IJ SOAJ: 0.3000 mg | Freq: Once | INTRAMUSCULAR | Status: DC

## 2015-05-01 MED ORDER — TRAMADOL HCL 50 MG PO TABS: 50.0000 mg | ORAL_TABLET | Freq: Four times a day (QID) | ORAL | Status: DC | PRN

## 2015-05-01 MED ORDER — DIPHENHYDRAMINE HCL 50 MG/ML IJ SOLN
INTRAMUSCULAR | Status: AC
  Administered 2015-05-01: 25 mg via INTRAVENOUS
  Filled 2015-05-01: qty 1

## 2015-05-01 NOTE — ED Provider Notes (Signed)
[facility] Emergency Department Provider Note   ____________________________________________  Time seen: 0930  I have reviewed the triage vital signs and the nursing notes.   HISTORY  Chief Complaint Allergic Reaction   History limited by: Not Limited   HPI Catherine Berg is a 34 y.o. female = who presents to the emergency department today because of concerns for hives. The patient was seen 2 days ago for the same symptoms. At that time it was thought that the hives are secondary to wheat bread. Patient states she was treated and the following day was feeling slightly better however this morning when she woke up she felt like the hives are worse. She stated that they're worse on her arms and legs. She states that the hives are itchy. The itchiness and discomfort as severe. Located on her upper torso, legs and upper arms. She denies any new soaps or detergents. Denies any new ingestions. Has not had any more wheat bread.     Past Medical History  Diagnosis Date  . Anxiety   . Low serum vitamin D   . Hypertension   . Reflux   . Obesity   . Vitamin D deficiency disease   . Vitamin B12 deficiency   . IFG (impaired fasting glucose)   . Abnormal thyroid blood test   . 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 History of abnormal cervical Pap smear   . History of cervical polypectomy   . Pregnancy induced hypertension     with 1st pregnancy, normal pressure with 2nd  . Thyroid disease   . Graves disease     Patient Active Problem List   Diagnosis Date Noted  . Graves' disease 03/26/2015  . Thyroid nodule 02/01/2015  . Hyperthyroidism 12/25/2014  . 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 Essential hypertension, benign 12/24/2014  . Obesity   . Vitamin D deficiency disease   . Vitamin B12 deficiency   . IFG (impaired fasting glucose)   . History of abnormal cervical Pap smear   . Breast pain, right 07/30/2014  . Childhood  asthma 10/09/2013    Past Surgical History  Procedure Laterality Date  . Cervical polypectomy    . Cesarean section      x 2  . Colon surgery      Current Outpatient Rx  Name  Route  Sig  Dispense  Refill  . amLODipine (NORVASC) 10 MG tablet   Oral   Take 1 tablet (10 mg total) by mouth daily.   30 tablet   6     Pharmacist - cancel nifedipine   . escitalopram (LEXAPRO) 10 MG tablet   Oral   Take 1 tablet (10 mg total) by mouth daily.   30 tablet   1   . famotidine (PEPCID) 40 MG tablet   Oral   Take 1 tablet (40 mg total) by mouth every evening.   7 tablet   0   . fluticasone (FLONASE) 50 MCG/ACT nasal spray   Each Nare   Place 2 sprays into both nostrils daily.   16 g   6   . hydrOXYzine (VISTARIL) 25 MG capsule   Oral   Take 1 capsule (25 mg total) by mouth 3 (three) times daily as needed.   15 capsule   0   . lactulose (CHRONULAC) 10 GM/15ML solution   Oral   Take 15 mLs (10 g total) by mouth daily as needed for mild constipation.   236  mL   0   . methimazole (TAPAZOLE) 5 MG tablet   Oral   Take 1 tablet (5 mg total) by mouth 3 (three) times daily.   90 tablet   0   . predniSONE (DELTASONE) 20 MG tablet   Oral   Take 3 tablets (60 mg total) by mouth daily.   12 tablet   0   . prenatal vitamin w/FE, FA (NATACHEW) 29-1 MG CHEW chewable tablet   Oral   Chew 1 tablet by mouth daily at 12 noon.   30 tablet   11   . vitamin B-12 (CYANOCOBALAMIN) 500 MCG tablet   Oral   Take 500 mcg by mouth daily.         Marland Kitchen VITAMIN D, CHOLECALCIFEROL, PO   Oral   Take 1 capsule by mouth once a week.           Allergies Latex and Shellfish allergy  Family History  Problem Relation Age of Onset  . Heart attack Mother 42  . Hypertension Mother   . Asthma Mother   . Cancer Mother     laryngeal  . Diabetes Mother     pre diabetic  . Heart disease Mother   . Mental illness Mother   . Alcohol abuse Mother   . Drug abuse Mother   . Depression  Mother   . Anxiety disorder Mother   . Bipolar disorder Mother   . Learning disabilities Brother   . ADD / ADHD Brother   . Asthma Son   . Heart disease Maternal Grandmother     heart attack, pacemaker  . Heart attack Maternal Grandmother   . Hypertension Maternal Grandmother   . Hypertension Maternal Grandfather   . Heart disease Paternal Grandmother     couple of major open heart surgeries, leaking valves  . Hypertension Paternal Grandmother   . Hypertension Paternal Grandfather   . Hyperlipidemia Brother   . Alcohol abuse Father     Social History Social History  Substance Use Topics  . Smoking status: Never Smoker   . Smokeless tobacco: Never Used  . Alcohol Use: No    Review of Systems  Constitutional: Negative for fever. Cardiovascular: Negative for chest pain. Respiratory: Negative for shortness of breath. Gastrointestinal: Negative for abdominal pain, vomiting and diarrhea. Skin: Positive for rash, pruritus Neurological: Negative for headaches, focal weakness or numbness.   10-point ROS otherwise negative.  ____________________________________________   PHYSICAL EXAM:  VITAL SIGNS: ED Triage Vitals  Enc Vitals Group     BP 05/01/15 0842 154/88 mmHg     Pulse Rate 05/01/15 0842 141     Resp 05/01/15 0842 22     Temp 05/01/15 0842 97.8 F (36.6 C)     Temp Source 05/01/15 0842 Oral     SpO2 05/01/15 0842 98 %     Weight 05/01/15 0842 213 lb (96.616 kg)     Height 05/01/15 0842  (1.6 m)   Constitutional: Alert and oriented. Well appearing and in no distress. Eyes: Conjunctivae are normal. PERRL. Normal extraocular movements. ENT   Head: Normocephalic and atraumatic.   Nose: No congestion/rhinnorhea.   Mouth/Throat: Mucous membranes are moist.   Neck: No stridor. Hematological/Lymphatic/Immunilogical: No cervical lymphadenopathy. Cardiovascular: Normal rate, regular rhythm.  No murmurs, rubs, or gallops. Respiratory: Normal  respiratory effort without tachypnea nor retractions. Breath sounds are clear and equal bilaterally. No wheezes/rales/rhonchi. Gastrointestinal: Soft and nontender. No distention.  Genitourinary: Deferred Musculoskeletal: Normal range of motion in  all extremities. No joint effusions.  No lower extremity tenderness nor edema. Neurologic:  Normal speech and language. No gross focal neurologic deficits are appreciated.  Skin:  Diffuse urticarial eruptions over the extremities and upper torso Psychiatric: Mood and affect are normal. Speech and behavior are normal. Patient exhibits appropriate insight and judgment.  ____________________________________________    LABS (pertinent positives/negatives)  Labs Reviewed  TSH - Abnormal; Notable for the following:    TSH 0.327 (*)    All other components within normal limits  CBC WITH DIFFERENTIAL/PLATELET - Abnormal; Notable for the following:    WBC 15.9 (*)    RDW 14.7 (*)    Neutro Abs 12.8 (*)    All other components within normal limits  BASIC METABOLIC PANEL - Abnormal; Notable for the following:    Glucose, Bld 171 (*)    All other components within normal limits  T4, FREE - Abnormal; Notable for the following:    Free T4 1.16 (*)    All other components within normal limits  T3, FREE     ____________________________________________   EKG  I, Phineas Semen, attending physician, personally viewed and interpreted this EKG  EKG Time: 0846 Rate: 118 Rhythm: sinus tachycardia Axis: normal Intervals: qtc 434 QRS: narrow ST changes: no st elevation Impression: normal ekg ____________________________________________    RADIOLOGY  None   ____________________________________________   PROCEDURES  Procedure(s) performed: None  Critical Care performed: No  ____________________________________________   INITIAL IMPRESSION / ASSESSMENT AND PLAN / ED COURSE  Pertinent labs & imaging results that were available  during my care of the patient were reviewed by me and considered in my medical decision making (see chart for details).  Resented to the emergency department today with continued urticaria. Patient was seen 2 days ago for similar symptoms. Patient was given IV medication as well as attempted epi. She did have some improvement of her symptoms after the appendectomy. This point I do not see any signs of being indicative of anaphylaxis. Unclear etiology of the urticaria. Patient's tachycardia likely secondary to hyperthyroidism. Discussed with patient importance of following with primary care and endocrinology.  ____________________________________________   FINAL CLINICAL IMPRESSION(S) / ED DIAGNOSES  Final diagnoses:  Urticaria     Phineas Semen, MD 05/02/15 [phone]

## 2015-05-01 NOTE — ED Notes (Signed)
Pt seen Monday night in ER for allergic reaction, pt presents today with hives on bilateral arms, pt denies dyspnea

## 2015-05-01 NOTE — Progress Notes (Signed)
Pharmacy notified.

## 2015-05-01 NOTE — Discharge Instructions (Signed)
Please seek medical attention for any high fevers, chest pain, shortness of breath, change in behavior, persistent vomiting, bloody stool or any other new or concerning symptoms.   Hives Hives are itchy, red, swollen areas of the skin. They can vary in size and location on your body. Hives can come and go for hours or several days (acute hives) or for several weeks (chronic hives). Hives do not spread from person to person (noncontagious). They may get worse with scratching, exercise, and emotional stress. CAUSES   Allergic reaction to food, additives, or drugs.  Infections, including the common cold.  Illness, such as vasculitis, lupus, or thyroid disease.  Exposure to sunlight, heat, or cold.  Exercise.  Stress.  Contact with chemicals. SYMPTOMS   Red or white swollen patches on the skin. The patches may change size, shape, and location quickly and repeatedly.  Itching.  Swelling of the hands, feet, and face. This may occur if hives develop deeper in the skin. DIAGNOSIS  Your caregiver can usually tell what is wrong by performing a physical exam. Skin or blood tests may also be done to determine the cause of your hives. In some cases, the cause cannot be determined. TREATMENT  Mild cases usually get better with medicines such as antihistamines. Severe cases may require an emergency epinephrine injection. If the cause of your hives is known, treatment includes avoiding that trigger.  HOME CARE INSTRUCTIONS   Avoid causes that trigger your hives.  Take antihistamines as directed by your caregiver to reduce the severity of your hives. Non-sedating or low-sedating antihistamines are usually recommended. Do not drive while taking an antihistamine.  Take any other medicines prescribed for itching as directed by your caregiver.  Wear loose-fitting clothing.  Keep all follow-up appointments as directed by your caregiver. SEEK MEDICAL CARE IF:   You have persistent or severe  itching that is not relieved with medicine.  You have painful or swollen joints. SEEK IMMEDIATE MEDICAL CARE IF:   You have a fever.  Your tongue or lips are swollen.  You have trouble breathing or swallowing.  You feel tightness in the throat or chest.  You have abdominal pain. These problems may be the first sign of a life-threatening allergic reaction. Call your local emergency services (911 in [place] MAKE SURE YOU:   Understand these instructions.  Will watch your condition.  Will get help right away if you are not doing well or get worse.   This information is not intended to replace advice given to you by your health care provider. Make sure you discuss any questions you have with your health care provider.   Document Released: 04/20/2005 Document Revised: 04/25/2013 Document Reviewed: 07/14/2011 Elsevier Interactive Patient Education [employer]2016 [employer]vier Inc.

## 2015-05-01 NOTE — ED Notes (Signed)
Seen here few days ago for rhives, received meds and rx, today has body soreness and increased in hives, , some chills, no sob

## 2015-05-02 ENCOUNTER — Encounter: Payer: Self-pay | Admitting: Obstetrics and Gynecology

## 2015-05-02 ENCOUNTER — Emergency Department
Admission: EM | Admit: 2015-05-02 | Discharge: 2015-05-02 | Disposition: A | Discharge status: Home / Self Care | Payer: Medicaid Other | Attending: Emergency Medicine | Admitting: Emergency Medicine

## 2015-05-02 DIAGNOSIS — Z91040 Latex allergy status: Secondary | ICD-10-CM | POA: Insufficient documentation

## 2015-05-02 DIAGNOSIS — T7840XA Allergy, unspecified, initial encounter: Secondary | ICD-10-CM | POA: Diagnosis not present

## 2015-05-02 DIAGNOSIS — L500 Allergic urticaria: Secondary | ICD-10-CM | POA: Diagnosis not present

## 2015-05-02 DIAGNOSIS — Z79899 Other long term (current) drug therapy: Secondary | ICD-10-CM | POA: Diagnosis not present

## 2015-05-02 DIAGNOSIS — Y9289 Other specified places as the place of occurrence of the external cause: Secondary | ICD-10-CM | POA: Diagnosis not present

## 2015-05-02 DIAGNOSIS — Y998 Other external cause status: Secondary | ICD-10-CM | POA: Insufficient documentation

## 2015-05-02 DIAGNOSIS — J45901 Unspecified asthma with (acute) exacerbation: Secondary | ICD-10-CM | POA: Diagnosis not present

## 2015-05-02 DIAGNOSIS — I10 Essential (primary) hypertension: Secondary | ICD-10-CM | POA: Diagnosis not present

## 2015-05-02 DIAGNOSIS — Y9389 Activity, other specified: Secondary | ICD-10-CM | POA: Insufficient documentation

## 2015-05-02 DIAGNOSIS — X58XXXA Exposure to other specified factors, initial encounter: Secondary | ICD-10-CM | POA: Insufficient documentation

## 2015-05-02 LAB — T3, FREE: T3, Free: 4 pg/mL (ref 2.0–4.4)

## 2015-05-02 MED ORDER — IPRATROPIUM-ALBUTEROL 0.5-2.5 (3) MG/3ML IN SOLN: 3.0000 mL | Freq: Once | RESPIRATORY_TRACT | Status: DC

## 2015-05-02 MED ORDER — PREDNISONE 10 MG PO TABS: ORAL_TABLET | ORAL | Status: DC

## 2015-05-02 MED ORDER — FAMOTIDINE IN NACL 20-0.9 MG/50ML-% IV SOLN
INTRAVENOUS | Status: AC
  Administered 2015-05-02: 20 mg via INTRAVENOUS
  Filled 2015-05-02: qty 50

## 2015-05-02 MED ORDER — ALBUTEROL SULFATE (2.5 MG/3ML) 0.083% IN NEBU
2.5000 mg | INHALATION_SOLUTION | Freq: Once | RESPIRATORY_TRACT | Status: AC
  Administered 2015-05-02: 2.5 mg via RESPIRATORY_TRACT

## 2015-05-02 MED ORDER — FAMOTIDINE IN NACL 20-0.9 MG/50ML-% IV SOLN
20.0000 mg | Freq: Once | INTRAVENOUS | Status: AC
  Administered 2015-05-02: 20 mg via INTRAVENOUS

## 2015-05-02 MED ORDER — DIPHENHYDRAMINE HCL 50 MG/ML IJ SOLN
INTRAMUSCULAR | Status: AC
  Administered 2015-05-02: 50 mg via INTRAVENOUS
  Filled 2015-05-02: qty 1

## 2015-05-02 MED ORDER — DIPHENHYDRAMINE HCL 50 MG/ML IJ SOLN
50.0000 mg | Freq: Once | INTRAMUSCULAR | Status: AC
  Administered 2015-05-02: 50 mg via INTRAVENOUS

## 2015-05-02 MED ORDER — ALBUTEROL SULFATE (2.5 MG/3ML) 0.083% IN NEBU
INHALATION_SOLUTION | RESPIRATORY_TRACT | Status: AC
  Administered 2015-05-02: 2.5 mg via RESPIRATORY_TRACT
  Filled 2015-05-02: qty 3

## 2015-05-02 NOTE — ED Notes (Signed)
AAOx3.  Skin warm and dry. No SOB/ DOE.  Rash resolved.  D/C home.

## 2015-05-02 NOTE — Discharge Instructions (Signed)

## 2015-05-02 NOTE — ED Provider Notes (Signed)
[facility] Emergency Department Provider Note  Time seen: 4:44 PM  I have reviewed the triage vital signs and the nursing notes.   HISTORY  Chief Complaint Allergic Reaction    HPI Catherine Berg is a 34 y.o. female with a past medical history of anxiety, hypertension, obesity, hypothyroidism, presents the emergency department a few times and itching. According to the patient approximately 4 days ago the patient developed diffuse hives and itching. She was seen at the emergency department, prescribed prednisone for 5 days, Vistaril, and has been using Benadryl intermittently as well. States initially the hives went away, but over the last few days they've intermittently come back, today they have been worse with diffuse itching. The patient also states she feels short of breath at times. Patient states she has one day of prednisone left, states she is not feeling much better so she came to the emergency department for reevaluation. Patient states allergies  to shellfish and latex, but denies any recent known exposures. Denies any new soaps/shampoo/detergent. Denies any oral swelling or wheeze.     Past Medical History  Diagnosis Date  . Anxiety   . Low serum vitamin D   . Hypertension   . Reflux   . Obesity   . Vitamin D deficiency disease   . Vitamin B12 deficiency   . IFG (impaired fasting glucose)   . Abnormal thyroid blood test   . 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 History of abnormal cervical Pap smear   . History of cervical polypectomy   . Pregnancy induced hypertension     with 1st pregnancy, normal pressure with 2nd  . Thyroid disease   . Graves disease     Patient Active Problem List   Diagnosis Date Noted  . Graves' disease 03/26/2015  . Thyroid nodule 02/01/2015  . Hyperthyroidism 12/25/2014  . 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 Essential hypertension, benign 12/24/2014  . Obesity   . Vitamin D  deficiency disease   . Vitamin B12 deficiency   . IFG (impaired fasting glucose)   . History of abnormal cervical Pap smear   . Breast pain, right 07/30/2014  . 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 Cervical polypectomy    . Cesarean section      x 2  . Colon surgery      Current Outpatient Rx  Name  Route  Sig  Dispense  Refill  . amLODipine (NORVASC) 10 MG tablet   Oral   Take 1 tablet (10 mg total) by mouth daily.   30 tablet   6     Pharmacist - cancel nifedipine   . EPINEPHrine 0.3 mg/0.3 mL IJ SOAJ injection   Intramuscular   Inject 0.3 mLs (0.3 mg total) into the muscle once.   1 Device   1   . escitalopram (LEXAPRO) 10 MG tablet   Oral   Take 1 tablet (10 mg total) by mouth daily.   30 tablet   1   . famotidine (PEPCID) 40 MG tablet   Oral   Take 1 tablet (40 mg total) by mouth every evening. Patient not taking: Reported on 05/01/2015   7 tablet   0   . fluticasone (FLONASE) 50 MCG/ACT nasal spray   Each Nare   Place 2 sprays into both nostrils daily. Patient not taking: Reported on 05/01/2015   16 g   6   . hydrOXYzine (  VISTARIL) 25 MG capsule   Oral   Take 1 capsule (25 mg total) by mouth 3 (three) times daily as needed. Patient not taking: Reported on 05/01/2015   15 capsule   0   . lactulose (CHRONULAC) 10 GM/15ML solution   Oral   Take 15 mLs (10 g total) by mouth daily as needed for mild constipation. Patient not taking: Reported on 05/01/2015   236 mL   0   . methimazole (TAPAZOLE) 5 MG tablet   Oral   Take 1 tablet (5 mg total) by mouth 3 (three) times daily.   90 tablet   0   . predniSONE (DELTASONE) 20 MG tablet   Oral   Take 3 tablets (60 mg total) by mouth daily. Patient not taking: Reported on 05/01/2015   12 tablet   0   . prenatal vitamin w/FE, FA (NATACHEW) 29-1 MG CHEW chewable tablet   Oral   Chew 1 tablet by mouth daily at 12 noon. Patient not taking: Reported on  05/01/2015   30 tablet   11   . traMADol (ULTRAM) 50 MG tablet   Oral   Take 1 tablet (50 mg total) by mouth every 6 (six) hours as needed.   15 tablet   0   . vitamin B-12 (CYANOCOBALAMIN) 500 MCG tablet   Oral   Take 500 mcg by mouth daily.         Marland Kitchen VITAMIN D, CHOLECALCIFEROL, PO   Oral   Take 1 capsule by mouth once a week.           Allergies Latex and Shellfish allergy  Family History  Problem Relation Age of Onset  . Heart attack Mother 74  . Hypertension Mother   . Asthma Mother   . Cancer Mother     laryngeal  . Diabetes Mother     pre diabetic  . Heart disease Mother   . Mental illness Mother   . Alcohol abuse Mother   . Drug abuse Mother   . Depression Mother   . Anxiety disorder Mother   . Bipolar disorder Mother   . Learning disabilities Brother   . ADD / ADHD Brother   . Asthma Son   . Heart disease Maternal Grandmother     heart attack, pacemaker  . Heart attack Maternal Grandmother   . Hypertension Maternal Grandmother   . Hypertension Maternal Grandfather   . Heart disease Paternal Grandmother     couple of major open heart surgeries, leaking valves  . Hypertension Paternal Grandmother   . Hypertension Paternal Grandfather   . Hyperlipidemia Brother   . Alcohol abuse Father     Social History Social History  Substance Use Topics  . Smoking status: Never Smoker   . Smokeless tobacco: Never Used  . Alcohol Use: No    Review of Systems Constitutional: Negative for fever Cardiovascular: Negative for chest pain. Respiratory:  Mild shortness of breath. Gastrointestinal: Negative for abdominal pain Genitourinary: Negative for dysuria. Musculoskeletal: Negative for back pain. Skin:  Positive for diffuse hives/itching. Neurological: Negative for headache 10-point ROS otherwise negative.  ____________________________________________   PHYSICAL EXAM:  VITAL SIGNS: ED Triage Vitals  Enc Vitals Group     BP 05/02/15 1555 125/75  mmHg     Pulse Rate 05/02/15 1555 87     Resp 05/02/15 1555 18     Temp 05/02/15 1555 97.8 F (36.6 C)     Temp Source 05/02/15 1555 Oral  SpO2 05/02/15 1555 99 %     Weight 05/02/15 1555 213 lb (96.616 kg)     Height 05/02/15 1555  (1.6 m)     Head Cir --      Peak Flow --      Pain Score 05/02/15 1557 3     Pain Loc --      Pain Edu? --      Excl. in GC? --     Constitutional: Alert and oriented. Well appearing and in no distress. Eyes: Normal exam ENT   Head: Normocephalic and atraumatic.   Mouth/Throat: Mucous membranes are moist. Cardiovascular: Normal rate, regular rhythm. No murmur Respiratory: Normal respiratory effort without tachypnea nor retractions. Breath sounds are clear and equal bilaterally. No wheezes/rales/rhonchi. Gastrointestinal: Soft and nontender. No distention. Musculoskeletal: Nontender with normal range of motion in all extremities.  Neurologic:  Normal speech and language. No gross focal neurologic deficits Skin:  Mild but diffuse urticaria present. Excoriated skin, patient actively scratching throughout entire examination. Psychiatric: Mood and affect are normal. Speech and behavior are normal.   ____________________________________________    INITIAL IMPRESSION / ASSESSMENT AND PLAN / ED COURSE  Pertinent labs & imaging results that were available during my care of the patient were reviewed by me and considered in my medical decision making (see chart for details).  Patient presents for what appears to be consistent with an allergic reaction. This is the patient's fourth day on steroids and continues to have hives. We will dose Benadryl and Pepcid in the emergency department and closely monitored to ensure resolution. I discussed with the patient the need to limit her scratching as this could exacerbate her histamine release and symptoms. I will also prescribe prednisone taper, I have recommended Benadryl 50 mg every 6 hours for  symptoms. Patient is agreeable to this plan. Patient does states subjective shortness of breath however has no oral edema on exam, and clear breath sounds without any wheeze. Given her subjective shortness of breath I will treat her with an albuterol nebulizer, and continue to closely monitor her in the emergency department.  ----------------------------------------- 6:01 PM on 05/02/2015 -----------------------------------------  Patient continues to appear well, hives are receding but still present. No wheezes or difficulty breathing per patient. We will continue to monitor in the emergency department. Likely will discharge on prednisone taper and the patient is continue by mouth Benadryl at home.  ----------------------------------------- 6:56 PM on 05/02/2015 -----------------------------------------  Hives are nearly gone at this time. We will discharge the patient home with a prednisone taper, Benadryl every 6 hours as needed, and primary care follow-up. Patient agreeable to plan.  ____________________________________________   FINAL CLINICAL IMPRESSION(S) / ED DIAGNOSES  Allergic reaction   Minna Antis, MD 05/02/15 [phone]

## 2015-05-02 NOTE — ED Notes (Signed)
Pt was initially seen here on Monday for an unknown allergic reaction.. Pt returns today with hives all over, c/o feeling SOB.. Pt is in NAD, states she took pepcid, prednisone, vistaril and benadryl today without any relief, states she used a family members inhaler with no relief..Marland Kitchen

## 2015-05-07 ENCOUNTER — Ambulatory Visit: Payer: 59 | Admitting: Psychiatry

## 2015-05-07 ENCOUNTER — Encounter: Payer: Self-pay | Admitting: Family Medicine

## 2015-05-07 ENCOUNTER — Telehealth: Payer: Self-pay | Admitting: Family Medicine

## 2015-05-07 DIAGNOSIS — T7840XD Allergy, unspecified, subsequent encounter: Secondary | ICD-10-CM

## 2015-05-07 DIAGNOSIS — T7840XA Allergy, unspecified, initial encounter: Secondary | ICD-10-CM | POA: Insufficient documentation

## 2015-05-07 MED ORDER — HYDROXYZINE PAMOATE 25 MG PO CAPS: 25.0000 mg | ORAL_CAPSULE | Freq: Four times a day (QID) | ORAL | Status: DC | PRN

## 2015-05-07 MED ORDER — FAMOTIDINE 40 MG PO TABS: 40.0000 mg | ORAL_TABLET | Freq: Every evening | ORAL | Status: DC

## 2015-05-07 NOTE — Telephone Encounter (Signed)
Pt called stated she needs a call back due to concerns pt has with her body. Pt also needs release to return to work by tomorrow even if it states she can't return to work until Monday her employer requires this information by tomorrow. Please call pt asap. Thanks.

## 2015-05-07 NOTE — Telephone Encounter (Signed)
I returned her call She had been congested; the day after Christmas, allergic reaction to something, not sure what it was; went to the ER three times; still itching, bottoms of feet are itching; red spots still coming and going Green mucous and diarrhea today She does not have any more pepcid, but I'll send that in Will refer to allergist -------------- Will plan to return to work May 13, 2015 Amy -- please update FMLA, return to work 05/13/2015 without restrictions

## 2015-05-07 NOTE — Telephone Encounter (Signed)
She would like to speak with you. She has several things going on but she does have appointment with you on Friday. Last week she had an allergic reaction to something she ate. She is still having some itching and red spots but not as severe. Legs are still "tight" like they were swollen. She had a cold at her last appointment but now she is coughing up and blowing out green stuff. Today she started having diarrhea. She also will need a letter stating she can return to work on Monday.

## 2015-05-08 NOTE — Telephone Encounter (Signed)
Catherine Berg handled her letter and faxed it since I was off Tuesday afternoon.

## 2015-05-10 ENCOUNTER — Encounter: Payer: Self-pay | Admitting: Family Medicine

## 2015-05-10 ENCOUNTER — Ambulatory Visit (INDEPENDENT_AMBULATORY_CARE_PROVIDER_SITE_OTHER): Payer: 59 | Admitting: Family Medicine

## 2015-05-10 VITALS — BP 141/90 | HR 97 | Temp 98.5°F | Ht 63.0 in | Wt 225.0 lb

## 2015-05-10 DIAGNOSIS — I10 Essential (primary) hypertension: Secondary | ICD-10-CM

## 2015-05-10 DIAGNOSIS — E0500 Thyrotoxicosis with diffuse goiter without thyrotoxic crisis or storm: Secondary | ICD-10-CM

## 2015-05-10 DIAGNOSIS — R7301 Impaired fasting glucose: Secondary | ICD-10-CM

## 2015-05-10 DIAGNOSIS — F4381 Prolonged grief disorder: Secondary | ICD-10-CM

## 2015-05-10 DIAGNOSIS — T7840XD Allergy, unspecified, subsequent encounter: Secondary | ICD-10-CM | POA: Diagnosis not present

## 2015-05-10 DIAGNOSIS — F4329 Adjustment disorder with other symptoms: Secondary | ICD-10-CM

## 2015-05-10 DIAGNOSIS — F4321 Adjustment disorder with depressed mood: Secondary | ICD-10-CM | POA: Diagnosis not present

## 2015-05-10 DIAGNOSIS — J4520 Mild intermittent asthma, uncomplicated: Secondary | ICD-10-CM | POA: Diagnosis not present

## 2015-05-10 DIAGNOSIS — N939 Abnormal uterine and vaginal bleeding, unspecified: Secondary | ICD-10-CM

## 2015-05-10 MED ORDER — ALBUTEROL SULFATE HFA 108 (90 BASE) MCG/ACT IN AERS: 2.0000 | INHALATION_SPRAY | RESPIRATORY_TRACT | Status: DC | PRN

## 2015-05-10 NOTE — Progress Notes (Signed)
BP 141/90 mmHg  Pulse 97  Temp(Src) 98.5 F (36.9 C)  Ht 5\' 3"  (1.6 m)  Wt 225 lb (102.059 kg)  BMI 39.87 kg/m2  SpO2 99%  LMP 02/08/2015 (Approximate)   Subjective:    Patient ID: Catherine Berg, female    DOB: 1980/07/23, 35 y.o.   MRN: [id number]  HPI: Catherine Berg is a 35 y.o. female  Chief Complaint  Patient presents with  . Follow-up    Follow up for FMLA   Patient had an allergic reaction, suspected to be from methimazole She went to the ER 3 times; she received a shot of epinephrine once; they almost kept her one time She still has some redness here and there, nowhere near where it was She is not having any itching, no sensation of throat closing but a little wheezing and tightness She has this lingering little cough; used her son's inhaler and that worked well; she is feeling a little better; she is still dealing with whatever she was allergic to  Her endocrinologist took her off of the methimazole in case that was it; there are a few cases where it could cause break-outs; put her back on PTU 200 mg every 8 hours   Check-in list of meds:  Current outpatient prescriptions:  .  albuterol (PROVENTIL HFA;VENTOLIN HFA) 108 (90 Base) MCG/ACT inhaler, Inhale 2 puffs into the lungs every 4 (four) hours as needed for wheezing or shortness of breath., Disp: 1 Inhaler, Rfl: 0 .  amLODipine (NORVASC) 10 MG tablet, Take 1 tablet (10 mg total) by mouth daily., Disp: 30 tablet, Rfl: 6 .  EPINEPHrine 0.3 mg/0.3 mL IJ SOAJ injection, Inject 0.3 mLs (0.3 mg total) into the muscle once., Disp: 1 Device, Rfl: 1 .  escitalopram (LEXAPRO) 10 MG tablet, Take 1 tablet (10 mg total) by mouth daily., Disp: 30 tablet, Rfl: 1 .  famotidine (PEPCID) 40 MG tablet, Take 1 tablet (40 mg total) by mouth every evening., Disp: 30 tablet, Rfl: 0 .  hydrOXYzine (VISTARIL) 25 MG capsule, Take 1 capsule (25 mg total) by mouth every 6 (six) hours as needed., Disp: 60 capsule, Rfl: 0 .  traMADol (ULTRAM)  50 MG tablet, Take 1 tablet (50 mg total) by mouth every 6 (six) hours as needed., Disp: 15 tablet, Rfl: 0 .  vitamin B-12 (CYANOCOBALAMIN) 500 MCG tablet, Take 500 mcg by mouth daily., Disp: , Rfl:  .  VITAMIN D, CHOLECALCIFEROL, PO, Take 1 capsule by mouth once a week., Disp: , Rfl:  .  propylthiouracil (PTU) 50 MG tablet, Take 4 tablets (200 mg total) by mouth 3 (three) times daily. Prescribed by endocrinologist, Disp: , Rfl:   Med list updated by MD:  She is no longer on the prednisone; no longer on the methimazole; now taking PTU 200 mg every 8 hours per her report  Depression screen [facility] 2/9 04/05/2015 03/22/2015 02/22/2015 02/01/2015 01/15/2015  Decreased Interest 2 2 1 2 3   Down, Depressed, Hopeless 2 2 1 1 2   PHQ - 2 Score 4 4 2 3 5   Altered sleeping 2 3 3 3 3   Tired, decreased energy 2 3 3 3 3   Change in appetite 1 3 3 3 2   Feeling bad or failure about yourself  1 3 1 2 1   Trouble concentrating 1 1 1 2 1   Moving slowly or fidgety/restless 0 1 0 0 0  Suicidal thoughts 0 0 0 0 0  PHQ-9 Score 11 18 13 16  15  Difficult doing work/chores Somewhat difficult Somewhat difficult Somewhat difficult - Somewhat difficult   She is still taking escitalopram; going to see psychiatrist  I asked how she feels about going back to work; at some point, I have to go back; she is interested in starting back 4 hours a day for the first week, then going back full time; no physical restrictions  She is still bleeding after the abortion, tomorrow will be a month; she did not get to back to them last week but is willing to go today; she was 10 weeks when the abortion took place  Relevant past medical, surgical, family and social history reviewed and updated as indicated. Interim medical history since our last visit reviewed. Allergies and medications reviewed and updated.  Review of Systems Per HPI unless specifically indicated above     Objective:    BP 141/90 mmHg  Pulse 97  Temp(Src) 98.5 F  (36.9 C)  Ht 5\' 3"  (1.6 m)  Wt 225 lb (102.059 kg)  BMI 39.87 kg/m2  SpO2 99%  LMP 02/08/2015 (Approximate)  Wt Readings from Last 3 Encounters:  05/10/15 225 lb (102.059 kg)  05/02/15 213 lb (96.616 kg)  05/01/15 213 lb (96.616 kg)    Physical Exam  Constitutional: She appears well-developed and well-nourished. No distress.  HENT:  No lip or tongue swelling  Cardiovascular: Normal rate and regular rhythm.   Pulmonary/Chest: Effort normal and breath sounds normal. No respiratory distress. She has no wheezes.  Neurological: She displays no tremor.  Skin: There is erythema (minimal erythema on extremities, no hives).  Psychiatric: She has a normal mood and affect. Her speech is normal and behavior is normal. Judgment and thought content normal. Her mood appears not anxious. Her affect is not blunt. Cognition and memory are normal. She does not exhibit a depressed mood.  Good eye contact with examiner   Results for orders placed or performed during the hospital encounter of 05/01/15  TSH  Result Value Ref Range   TSH 0.327 (L) 0.350 - 4.500 uIU/mL  CBC with Differential  Result Value Ref Range   WBC 15.9 (H) 3.6 - 11.0 K/uL   RBC 4.73 3.80 - 5.20 MIL/uL   Hemoglobin 13.0 12.0 - 16.0 g/dL   HCT 16.1 09.6 - 04.5 %   MCV 83.8 80.0 - 100.0 fL   MCH 27.6 26.0 - 34.0 pg   MCHC 32.9 32.0 - 36.0 g/dL   RDW 40.9 (H) 81.1 - 91.4 %   Platelets 388 150 - 440 K/uL   Neutrophils Relative % 81 %   Neutro Abs 12.8 (H) 1.4 - 6.5 K/uL   Lymphocytes Relative 17 %   Lymphs Abs 2.8 1.0 - 3.6 K/uL   Monocytes Relative 2 %   Monocytes Absolute 0.3 0.2 - 0.9 K/uL   Eosinophils Relative 0 %   Eosinophils Absolute 0.0 0 - 0.7 K/uL   Basophils Relative 0 %   Basophils Absolute 0.0 0 - 0.1 K/uL  Basic metabolic panel  Result Value Ref Range   Sodium 137 135 - 145 mmol/L   Potassium 3.6 3.5 - 5.1 mmol/L   Chloride 105 101 - 111 mmol/L   CO2 23 22 - 32 mmol/L   Glucose, Bld 171 (H) 65 - 99 mg/dL    BUN 15 6 - 20 mg/dL   Creatinine, Ser 7.82 0.44 - 1.00 mg/dL   Calcium 9.1 8.9 - 95.6 mg/dL   GFR calc non Af Amer >60 >60 mL/min  GFR calc Af Amer >60 >60 mL/min   Anion gap 9 5 - 15  T3, free  Result Value Ref Range   T3, Free 4.0 2.0 - 4.4 pg/mL  T4, free  Result Value Ref Range   Free T4 1.16 (H) 0.61 - 1.12 ng/dL      Assessment & Plan:   Problem List Items Addressed This Visit      Cardiovascular and Mediastinum   Essential hypertension, benign - Primary    Not quite to goal, but suspect the recent prednisone, weight gain, med changes for her thyroid all playing a role; patient to monitor, call if not controlled        Respiratory   Childhood asthma    With some recent wheezing related to allergic reaction; Rx given for SABA to use if needed; expect wheezing to resolve, don't think she'll need daily controller, but call if not resolving      Relevant Medications   albuterol (PROVENTIL HFA;VENTOLIN HFA) 108 (90 Base) MCG/ACT inhaler     Endocrine   IFG (impaired fasting glucose)    Elevated glucose (non-fastingg) noted in ER labs; on prednisone recently; will monitor A1c every 6 months      Graves' disease    Off of methimazole now due to suspected allergic reaction; on PTU; appreciate the care of her endocrinologist      Relevant Medications   propylthiouracil (PTU) 50 MG tablet     Other   Prolonged grief reaction    Patient believes she will be ready to go back to work on Monday, and we'll start her at 4 hours per day for the first week, then full-time the next week; no restrictions or limitations otherwise; continue medicine, continue follow-up with psychiatrist      Allergic reaction    Suspected to be related to methimazole; that has been stopped and PTU substituted; SABA given to use in case she has mild wheezing; to ER if severe reaction occurs       Other Visit Diagnoses    Vaginal bleeding        after abortion that took place at [redacted] weeks  gestation; I encouraged her to see her GYN today       Follow up plan: No Follow-up on file.  Medications Discontinued During This Encounter  Medication Reason  . predniSONE (DELTASONE) 10 MG tablet Completed Course  . methimazole (TAPAZOLE) 5 MG tablet Discontinued by provider  . prenatal vitamin w/FE, FA (NATACHEW) 29-1 MG CHEW chewable tablet Completed Course  . lactulose (CHRONULAC) 10 GM/15ML solution Patient Preference  . fluticasone (FLONASE) 50 MCG/ACT nasal spray Patient Preference   An after-visit summary was printed and given to the patient at check-out.  Please see the patient instructions which may contain other information and recommendations beyond what is mentioned above in the assessment and plan.

## 2015-05-10 NOTE — Patient Instructions (Addendum)
GINA--> return to work letter; may return on Monday to work four hours a day next week, then return full-time the next week; no physical restrictions Use the inhaler if needed Do [place] in [place] I put in the referral to the allergist, so please call us next week if you've not heard anything back (by January 11th)

## 2015-05-11 NOTE — Assessment & Plan Note (Signed)
Not quite to goal, but suspect the recent prednisone, weight gain, med changes for her thyroid all playing a role; patient to monitor, call if not controlled

## 2015-05-11 NOTE — Assessment & Plan Note (Signed)
Elevated glucose (non-fastingg) noted in ER labs; on prednisone recently; will monitor A1c every 6 months

## 2015-05-11 NOTE — Assessment & Plan Note (Signed)
Patient believes she will be ready to go back to work on Monday, and we'll start her at 4 hours per day for the first week, then full-time the next week; no restrictions or limitations otherwise; continue medicine, continue follow-up with psychiatrist

## 2015-05-11 NOTE — Assessment & Plan Note (Signed)
Suspected to be related to methimazole; that has been stopped and PTU substituted; SABA given to use in case she has mild wheezing; to ER if severe reaction occurs

## 2015-05-11 NOTE — Assessment & Plan Note (Signed)
With some recent wheezing related to allergic reaction; Rx given for SABA to use if needed; expect wheezing to resolve, don't think she'll need daily controller, but call if not resolving

## 2015-05-11 NOTE — Assessment & Plan Note (Signed)
Off of methimazole now due to suspected allergic reaction; on PTU; appreciate the care of her endocrinologist

## 2015-05-22 ENCOUNTER — Ambulatory Visit: Payer: Self-pay | Admitting: Obstetrics and Gynecology

## 2015-05-22 ENCOUNTER — Telehealth: Payer: Self-pay | Admitting: Family Medicine

## 2015-05-22 DIAGNOSIS — T7840XD Allergy, unspecified, subsequent encounter: Secondary | ICD-10-CM

## 2015-05-22 NOTE — Assessment & Plan Note (Signed)
Refer to ENT

## 2015-05-22 NOTE — Telephone Encounter (Signed)
Can you enter new referral to [facility] for her allergic reactions.

## 2015-05-22 NOTE — Telephone Encounter (Signed)
Pt called stated that the allergy clinic she referred to will not be able to get her in until May. Can she be referred to a different clinic? Please call pt ASAP. Thanks.

## 2015-05-22 NOTE — Telephone Encounter (Signed)
Yes, let her know that I've put that referral in; we hope she is doing well

## 2015-05-23 NOTE — Telephone Encounter (Signed)
Called and left patient a voicemail asking for her to please return my call.  

## 2015-05-24 NOTE — Telephone Encounter (Signed)
Called and left patient a voicemail asking for her to please return my call.  

## 2015-05-31 ENCOUNTER — Encounter: Payer: Self-pay | Admitting: Family Medicine

## 2015-06-20 ENCOUNTER — Telehealth: Payer: Self-pay | Admitting: Family Medicine

## 2015-06-20 NOTE — Telephone Encounter (Signed)
Pt called and stated she was having pain in her left shoulder and would like a call back.

## 2015-06-21 NOTE — Telephone Encounter (Signed)
Patient returned my call but I was not at my desk. So I tried returning her call but she did not answer. I left her a voicemail asking for her to please return my call.

## 2015-06-21 NOTE — Telephone Encounter (Signed)
Called and left patient a voicemail asking for her to return my call. 

## 2015-06-24 NOTE — Telephone Encounter (Signed)
Called and left patient a voicemail asking for her to please return my call.  

## 2015-06-27 NOTE — Telephone Encounter (Signed)
Appt 06/28/15

## 2015-06-27 NOTE — Telephone Encounter (Signed)
I'll be glad to see her for these issues; I never knew she had shoulder problems until just now; today is the first I am hearing about this; I can see her for that too

## 2015-06-27 NOTE — Telephone Encounter (Signed)
Called and spoke to patient. She stated her shoulder is not bothering her as much now, but states her lower back has been bothering her for about a week. She stated she has tried taking tylenol and it helps some. Patient also wanted to let Dr. Sherie Don know that she feels as if her anxiety level is rising, like when [place] on the interstate. Patient would like to know what to do or if she needs an appointment.

## 2015-06-28 ENCOUNTER — Ambulatory Visit (INDEPENDENT_AMBULATORY_CARE_PROVIDER_SITE_OTHER): Payer: Medicaid Other | Admitting: Family Medicine

## 2015-06-28 ENCOUNTER — Encounter: Payer: Self-pay | Admitting: Family Medicine

## 2015-06-28 VITALS — BP 131/90 | HR 88 | Temp 98.8°F | Ht 62.5 in | Wt 230.4 lb

## 2015-06-28 DIAGNOSIS — N62 Hypertrophy of breast: Secondary | ICD-10-CM | POA: Insufficient documentation

## 2015-06-28 DIAGNOSIS — F419 Anxiety disorder, unspecified: Secondary | ICD-10-CM

## 2015-06-28 DIAGNOSIS — M545 Low back pain, unspecified: Secondary | ICD-10-CM

## 2015-06-28 DIAGNOSIS — M25512 Pain in left shoulder: Secondary | ICD-10-CM | POA: Diagnosis not present

## 2015-06-28 DIAGNOSIS — E0500 Thyrotoxicosis with diffuse goiter without thyrotoxic crisis or storm: Secondary | ICD-10-CM

## 2015-06-28 MED ORDER — ESCITALOPRAM OXALATE 10 MG PO TABS: 15.0000 mg | ORAL_TABLET | Freq: Every day | ORAL | Status: DC

## 2015-06-28 MED ORDER — NAPROXEN 375 MG PO TABS: 375.0000 mg | ORAL_TABLET | Freq: Two times a day (BID) | ORAL | Status: DC

## 2015-06-28 NOTE — Patient Instructions (Addendum)
Try some lower back stretches Try the new naproxen for discomfort Okay to use tylenol Increase the escitalopram to 15 mg daily Return in 3-4 weeks  I'll be moving to another practice, [facility], on August 05, 2015 I'll be here until August 02, 2015  [facility] refer you to a plastic surgeon If you have not heard anything from my staff in a week about any orders/referrals/studies from today, please contact us here to follow-up [phone]

## 2015-06-28 NOTE — Progress Notes (Signed)
BP 131/90 mmHg  Pulse 88  Temp(Src) 98.8 F (37.1 C)  Ht 5' 2.5" (1.588 m)  Wt 230 lb 6.4 oz (104.509 kg)  BMI 41.44 kg/m2  SpO2 99%  LMP 02/08/2015 (Approximate)   Subjective:    Patient ID: Catherine Berg, female    DOB: 08-15-1980, 35 y.o.   MRN: [id number]  HPI: Catherine Berg is a 35 y.o. female  Chief Complaint  Patient presents with  . Shoulder Pain  . Back Pain  . Anxiety   Patient is here for an acute visit Left arm pain and shoulder pain; across the top of the left shoulder Lower back pain, across the lower back midline across the sides  Taking two tylenol every night; when laying still, she feels it; standing for a long time makes it worse; if she is moving she is fine; back can really hurt  She also wanted to talk about anxiety levels Always thinking something might happen when she is trying to lay down and go to sleep If she is driving on the interstate, she gets rise of everything, just rise of anxiety on the interstate; once she parks, she is okay Just now resurfacing She has been using vistaril; one at night with the tylenol, helps her sleep; does not get caught up in the anxiety; can be awake without it until 3 am and 4 am with her miind just going  She talked to her thyroid doctor about waking up during the night; now back to eating when she wakes up; feels like she has to eat The methimazole was stopped b/c of possible allergic reaction; felt better on it versus the PTU; started Feb 2nd  Allergic to dust mites; dog, cat, no tree pollen but allergic to grasses; had the full testing done   Relevant past medical, surgical, and social history reviewed and updated as indicated. Interim medical history since our last visit reviewed. Allergies and medications reviewed and updated.  Review of Systems  Per HPI unless specifically indicated above     Objective:    BP 131/90 mmHg  Pulse 88  Temp(Src) 98.8 F (37.1 C)  Ht 5' 2.5" (1.588 m)  Wt 230 lb  6.4 oz (104.509 kg)  BMI 41.44 kg/m2  SpO2 99%  LMP 02/08/2015 (Approximate)  Wt Readings from Last 3 Encounters:  07/01/15 230 lb (104.327 kg)  06/28/15 230 lb 6.4 oz (104.509 kg)  05/10/15 225 lb (102.059 kg)    Physical Exam  Constitutional: She appears well-developed and well-nourished.  Cardiovascular: Normal rate.   Pulmonary/Chest: Effort normal.  Musculoskeletal:       Right shoulder: She exhibits normal range of motion, no tenderness, no bony tenderness, no swelling and no deformity.       Left shoulder: She exhibits decreased range of motion and tenderness. She exhibits no deformity.       Lumbar back: She exhibits normal range of motion.  Physical indentation from bra strap across both shoulders  Neurological: She displays no tremor. Gait normal.  LE strength 5/5  Psychiatric: She has a normal mood and affect. Her mood appears not anxious. She does not exhibit a depressed mood.   Results for orders placed or performed during the hospital encounter of 05/01/15  TSH  Result Value Ref Range   TSH 0.327 (L) 0.350 - 4.500 uIU/mL  CBC with Differential  Result Value Ref Range   WBC 15.9 (H) 3.6 - 11.0 K/uL   RBC 4.73 3.80 - 5.20 MIL/uL  Hemoglobin 13.0 12.0 - 16.0 g/dL   HCT 09.8 11.9 - 14.7 %   MCV 83.8 80.0 - 100.0 fL   MCH 27.6 26.0 - 34.0 pg   MCHC 32.9 32.0 - 36.0 g/dL   RDW 82.9 (H) 56.2 - 13.0 %   Platelets 388 150 - 440 K/uL   Neutrophils Relative % 81 %   Neutro Abs 12.8 (H) 1.4 - 6.5 K/uL   Lymphocytes Relative 17 %   Lymphs Abs 2.8 1.0 - 3.6 K/uL   Monocytes Relative 2 %   Monocytes Absolute 0.3 0.2 - 0.9 K/uL   Eosinophils Relative 0 %   Eosinophils Absolute 0.0 0 - 0.7 K/uL   Basophils Relative 0 %   Basophils Absolute 0.0 0 - 0.1 K/uL  Basic metabolic panel  Result Value Ref Range   Sodium 137 135 - 145 mmol/L   Potassium 3.6 3.5 - 5.1 mmol/L   Chloride 105 101 - 111 mmol/L   CO2 23 22 - 32 mmol/L   Glucose, Bld 171 (H) 65 - 99 mg/dL   BUN 15  6 - 20 mg/dL   Creatinine, Ser 8.65 0.44 - 1.00 mg/dL   Calcium 9.1 8.9 - 78.4 mg/dL   GFR calc non Af Amer >60 >60 mL/min   GFR calc Af Amer >60 >60 mL/min   Anion gap 9 5 - 15  T3, free  Result Value Ref Range   T3, Free 4.0 2.0 - 4.4 pg/mL  T4, free  Result Value Ref Range   Free T4 1.16 (H) 0.61 - 1.12 ng/dL      Assessment & Plan:   Problem List Items Addressed This Visit      Endocrine   Graves' disease    Managed by endocrinologist        Other   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    With deep indentations in shoulders from bra straps; also with low back pain; refer to plastic surgeon for evaluation for possible reduction mammoplasty      Relevant Orders   Ambulatory referral to General Surgery   Ambulatory referral to Plastic Surgery   Anxiety    Associated with driving; counseling may be beneificial since situational; increase escitalopram to 15 mg daily; close f/u      Relevant Medications   escitalopram (LEXAPRO) 10 MG tablet    Other Visit Diagnoses    Bilateral low back pain without sciatica    -  Primary    stretching, NSAID, tylenol; breast hypertrophy may be worsening condition    Relevant Medications    naproxen (NAPROSYN) 375 MG tablet    Left shoulder pain        should be self-limiting; NSAID; tylenol in addition for pain if needed; call if not improving       Follow up plan: No Follow-up on file. 3-4 weeks  Meds ordered this encounter  Medications  . escitalopram (LEXAPRO) 10 MG tablet    Sig: Take 1.5 tablets (15 mg total) by mouth daily.    Dispense:  45 tablet    Refill:  1    We're increasing this  . naproxen (NAPROSYN) 375 MG tablet    Sig: Take 1 tablet (375 mg total) by mouth 2 (two) times daily with a meal. No additional NSAIDs    Dispense:  60 tablet    Refill:  0   An after-visit summary was printed and given to the patient at check-out.  Please see the patient instructions which may  contain other information and recommendations  beyond what is mentioned above in the assessment and plan.  Face-to-face time with patient was more than 25 minutes, >50% time spent counseling and coordination of care

## 2015-07-01 ENCOUNTER — Emergency Department
Admission: EM | Admit: 2015-07-01 | Discharge: 2015-07-01 | Disposition: A | Discharge status: Home / Self Care | Payer: Medicaid Other | Attending: Emergency Medicine | Admitting: Emergency Medicine

## 2015-07-01 ENCOUNTER — Telehealth: Payer: Self-pay

## 2015-07-01 ENCOUNTER — Encounter: Payer: Self-pay | Admitting: *Deleted

## 2015-07-01 DIAGNOSIS — I10 Essential (primary) hypertension: Secondary | ICD-10-CM | POA: Insufficient documentation

## 2015-07-01 DIAGNOSIS — Z91040 Latex allergy status: Secondary | ICD-10-CM | POA: Diagnosis not present

## 2015-07-01 DIAGNOSIS — Z3202 Encounter for pregnancy test, result negative: Secondary | ICD-10-CM | POA: Insufficient documentation

## 2015-07-01 DIAGNOSIS — R509 Fever, unspecified: Secondary | ICD-10-CM | POA: Diagnosis present

## 2015-07-01 DIAGNOSIS — B349 Viral infection, unspecified: Secondary | ICD-10-CM | POA: Diagnosis not present

## 2015-07-01 DIAGNOSIS — L509 Urticaria, unspecified: Secondary | ICD-10-CM | POA: Diagnosis not present

## 2015-07-01 DIAGNOSIS — J029 Acute pharyngitis, unspecified: Secondary | ICD-10-CM

## 2015-07-01 LAB — RAPID INFLUENZA A&B ANTIGENS
Influenza A (ARMC): NOT DETECTED — AB
Influenza B (ARMC): NOT DETECTED — AB

## 2015-07-01 LAB — POCT PREGNANCY, URINE: Preg Test, Ur: NEGATIVE

## 2015-07-01 MED ORDER — GI COCKTAIL ~~LOC~~
30.0000 mL | Freq: Once | ORAL | Status: AC
  Administered 2015-07-01: 30 mL via ORAL
  Filled 2015-07-01: qty 30

## 2015-07-01 MED ORDER — ACETAMINOPHEN 325 MG PO TABS
650.0000 mg | ORAL_TABLET | Freq: Once | ORAL | Status: AC | PRN
  Administered 2015-07-01: 650 mg via ORAL

## 2015-07-01 MED ORDER — METHYLPREDNISOLONE SODIUM SUCC 125 MG IJ SOLR
125.0000 mg | Freq: Once | INTRAMUSCULAR | Status: AC
  Administered 2015-07-01: 125 mg via INTRAVENOUS
  Filled 2015-07-01: qty 2

## 2015-07-01 MED ORDER — KETOROLAC TROMETHAMINE 30 MG/ML IJ SOLN
30.0000 mg | Freq: Once | INTRAMUSCULAR | Status: AC
  Administered 2015-07-01: 30 mg via INTRAVENOUS
  Filled 2015-07-01: qty 1

## 2015-07-01 MED ORDER — AMOXICILLIN-POT CLAVULANATE 875-125 MG PO TABS
1.0000 | ORAL_TABLET | Freq: Two times a day (BID) | ORAL | Status: AC
Start: 2015-07-01 — End: 2015-07-08

## 2015-07-01 MED ORDER — PREDNISONE 10 MG (21) PO TBPK: 10.0000 mg | ORAL_TABLET | Freq: Every day | ORAL | Status: DC

## 2015-07-01 MED ORDER — ACETAMINOPHEN 325 MG PO TABS
ORAL_TABLET | ORAL | Status: AC
  Filled 2015-07-01: qty 2

## 2015-07-01 NOTE — ED Notes (Addendum)
States last night began having chills, body aches and fever, states she broke out in hives, pt in no distress, awake and alert, also states sore throat

## 2015-07-01 NOTE — ED Provider Notes (Signed)
[facility] Emergency Department Provider Note     Time seen: ----------------------------------------- 6:29 PM on 07/01/2015 -----------------------------------------    I have reviewed the triage vital signs and the nursing notes.   HISTORY  Chief Complaint Fever and Generalized Body Aches    HPI Catherine Berg is a 35 y.o. female who presents ER for fevers chills body aches, sore throat. Patient states she also broke out with hives. She denies any significant cough, denies any other complaints. Symptoms began last night. Nothing made it better.   Past Medical History  Diagnosis Date  . Anxiety   . Low serum vitamin D   . Hypertension   . Reflux   . Obesity   . Vitamin D deficiency disease   . Vitamin B12 deficiency   . IFG (impaired fasting glucose)   . Abnormal thyroid blood test   . 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 History of abnormal cervical Pap smear   . History of cervical polypectomy   . Pregnancy induced hypertension     with 1st pregnancy, normal pressure with 2nd  . Thyroid disease   . Graves disease     Patient Active Problem List   Diagnosis Date Noted  . 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 Allergic reaction 05/07/2015  . Graves' disease 03/26/2015  . Thyroid nodule 02/01/2015  . Hyperthyroidism 12/25/2014  . 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 Essential hypertension, benign 12/24/2014  . Obesity   . Vitamin D deficiency disease   . Vitamin B12 deficiency   . IFG (impaired fasting glucose)   . History of abnormal cervical Pap smear   . Breast pain, right 07/30/2014  . 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 Cervical polypectomy    . Cesarean section      x 2  . Colon surgery      Allergies Latex and Shellfish allergy  Social History Social History  Substance Use Topics  . Smoking status: Never Smoker   . Smokeless  tobacco: Never Used  . Alcohol Use: No    Review of Systems Constitutional: Positive for fever, chills, bodyaches Eyes: Negative for visual changes. ENT: Positive for sore throat Cardiovascular: Negative for chest pain. Respiratory: Negative for shortness of breath. Gastrointestinal: Negative for abdominal pain, vomiting and diarrhea. Genitourinary: Negative for dysuria. Musculoskeletal: Negative for back pain. Skin: Negative for rash. Neurological: Negative for headaches, positive for weakness  10-point ROS otherwise negative.  ____________________________________________   PHYSICAL EXAM:  VITAL SIGNS: ED Triage Vitals  Enc Vitals Group     BP 07/01/15 1719 138/95 mmHg     Pulse Rate 07/01/15 1719 122     Resp 07/01/15 1719 24     Temp 07/01/15 1719 103 F (39.4 C)     Temp Source 07/01/15 1719 Oral     SpO2 07/01/15 1719 99 %     Weight 07/01/15 1719 230 lb (104.327 kg)     Height 07/01/15 1719  (1.6 m)     Head Cir --      Peak Flow --      Pain Score 07/01/15 1720 7     Pain Loc --      Pain Edu? --      Excl. in GC? --     Constitutional: Alert and oriented. Well appearing and in no distress. [facility]. Normal extraocular movements. ENT   Head: Normocephalic  and atraumatic.   Nose: No congestion/rhinnorhea.   Mouth/Throat: Markedly pharyngeal erythema bilaterally with slight exudate on the tonsils.   Neck: No stridor. Cardiovascular: Rapid rate, regular rhythm. Normal and symmetric distal pulses are present in all extremities. No murmurs, rubs, or gallops. Respiratory: Normal respiratory effort without tachypnea nor retractions. Breath sounds are clear and equal bilaterally. No wheezes/rales/rhonchi. Gastrointestinal: Soft and nontender. No distention. No abdominal bruits.  Musculoskeletal: Nontender with normal range of motion in all extremities. No joint effusions.  No lower extremity tenderness nor  edema. Neurologic:  Normal speech and language. No gross focal neurologic deficits are appreciated. Speech is normal. No gait instability. Skin:  Skin is warm, dry and intact. No rash noted. ____________________________________________  ED COURSE:  Pertinent labs & imaging results that were available during my care of the patient were reviewed by me and considered in my medical decision making (see chart for details).  patient is no acute distress, likely the influenza are Pharyngitis.  ____________________________________________    LABS (pertinent positives/negatives)  Labs Reviewed  RAPID INFLUENZA A&B ANTIGENS ([facility] ONLY) - Abnormal; Notable for the following:    [facility]flu[facility] ([facility]    Influenza B ([facility]) NOT DETECTED (*)    All other components within normal limits  CULTURE, GROUP A STREP [facility])  POCT PREGNANCY, URINE   ____________________________________________  FINAL ASSESSMENT AND PLAN   impression: Viral syndrome, pharyngitis, hives  Plan: Patient with labs as dictated above. Patient be discharged with amoxicillin as my strep throat suspicions are very high, she will also be on a steroid taper. She stable for outpatient follow-up with her doctor.   Emily Filbert, MD   Emily Filbert, MD 07/01/15 Corky Crafts

## 2015-07-01 NOTE — Discharge Instructions (Signed)
Hives °Hives are itchy, red, swollen areas of the skin. They can vary in size and location on your body. Hives can come and go for hours or several days (acute hives) or for several weeks (chronic hives). Hives do not spread from person to person (noncontagious). They may get worse with scratching, exercise, and emotional stress. °CAUSES  °· Allergic reaction to food, additives, or drugs. °· Infections, including the common cold. °· Illness, such as vasculitis, lupus, or thyroid disease. °· Exposure to sunlight, heat, or cold. °· Exercise. °· Stress. °· Contact with chemicals. °SYMPTOMS  °· Red or white swollen patches on the skin. The patches may change size, shape, and location quickly and repeatedly. °· Itching. °· Swelling of the hands, feet, and face. This may occur if hives develop deeper in the skin. °DIAGNOSIS  °Your caregiver can usually tell what is wrong by performing a physical exam. Skin or blood tests may also be done to determine the cause of your hives. In some cases, the cause cannot be determined. °TREATMENT  °Mild cases usually get better with medicines such as antihistamines. Severe cases may require an emergency epinephrine injection. If the cause of your hives is known, treatment includes avoiding that trigger.  °HOME CARE INSTRUCTIONS  °· Avoid causes that trigger your hives. °· Take antihistamines as directed by your caregiver to reduce the severity of your hives. Non-sedating or low-sedating antihistamines are usually recommended. Do not drive while taking an antihistamine. °· Take any other medicines prescribed for itching as directed by your caregiver. °· Wear loose-fitting clothing. °· Keep all follow-up appointments as directed by your caregiver. °SEEK MEDICAL CARE IF:  °· You have persistent or severe itching that is not relieved with medicine. °· You have painful or swollen joints. °SEEK IMMEDIATE MEDICAL CARE IF:  °· You have a fever. °· Your tongue or lips are swollen. °· You have  trouble breathing or swallowing. °· You feel tightness in the throat or chest. °· You have abdominal pain. °These problems may be the first sign of a life-threatening allergic reaction. Call your local emergency services (911 in [place] °MAKE SURE YOU:  °· Understand these instructions. °· Will watch your condition. °· Will get help right away if you are not doing well or get worse. °  °This information is not intended to replace advice given to you by your health care provider. Make sure you discuss any questions you have with your health care provider. °  °Document Released: 04/20/2005 Document Revised: 04/25/2013 Document Reviewed: 07/14/2011 °Elsevier Interactive Patient Education ©2016 Else[employer] Inc[employer] °Pharyngitis °Pharyngitis is redness, pain, and swelling (inflammation) of your pharynx.  °CAUSES  °Pharyngitis is usually caused by infection. Most of the time, these infections are from viruses (viral) and are part of a cold. However, sometimes pharyngitis is caused by bacteria (bacterial). Pharyngitis can also be caused by allergies. Viral pharyngitis may be spread from person to person by coughing, sneezing, and personal items or utensils (cups, forks, spoons, toothbrushes). Bacterial pharyngitis may be spread from person to person by more intimate contact, such as kissing.  °SIGNS AND SYMPTOMS  °Symptoms of pharyngitis include:   °· Sore throat.   °· Tiredness (fatigue).   °· Low-grade fever.   °· Headache. °· Joint pain and muscle aches. °· Skin rashes. °· Swollen lymph nodes. °· Plaque-like film on throat or tonsils (often seen with bacterial pharyngitis). °DIAGNOSIS  °Your health care provider will ask you questions about your illness and your symptoms. Your medical history, along with a physical   exam, is often all that is needed to diagnose pharyngitis. Sometimes, a rapid strep test is done. Other lab tests may also be done, depending on the suspected cause.  °TREATMENT  °Viral pharyngitis will usually get  better in 3-4 days without the use of medicine. Bacterial pharyngitis is treated with medicines that kill germs (antibiotics).  °HOME CARE INSTRUCTIONS  °· Drink enough water and fluids to keep your urine clear or pale yellow.   °· Only take over-the-counter or prescription medicines as directed by your health care provider:   °· If you are prescribed antibiotics, make sure you finish them even if you start to feel better.   °· Do not take aspirin.   °· Get lots of rest.   °· Gargle with 8 oz of salt water (½ tsp of salt per 1 qt of water) as often as every 1-2 hours to soothe your throat.   °· Throat lozenges (if you are not at risk for choking) or sprays may be used to soothe your throat. °SEEK MEDICAL CARE IF:  °· You have large, tender lumps in your neck. °· You have a rash. °· You cough up green, yellow-brown, or bloody spit. °SEEK IMMEDIATE MEDICAL CARE IF:  °· Your neck becomes stiff. °· You drool or are unable to swallow liquids. °· You vomit or are unable to keep medicines or liquids down. °· You have severe pain that does not go away with the use of recommended medicines. °· You have trouble breathing (not caused by a stuffy nose). °MAKE SURE YOU:  °· Understand these instructions. °· Will watch your condition. °· Will get help right away if you are not doing well or get worse. °  °This information is not intended to replace advice given to you by your health care provider. Make sure you discuss any questions you have with your health care provider. °  °Document Released: 04/20/2005 Document Revised: 02/08/2013 Document Reviewed: 12/26/2012 °Elsevier Interactive Patient Education ©2016 Elsevier Inc. ° °

## 2015-07-02 ENCOUNTER — Ambulatory Visit: Payer: Self-pay | Admitting: Unknown Physician Specialty

## 2015-07-03 NOTE — Telephone Encounter (Signed)
error 

## 2015-07-04 ENCOUNTER — Telehealth: Payer: Self-pay | Admitting: Family Medicine

## 2015-07-04 LAB — CULTURE, GROUP A STREP (THRC)

## 2015-07-04 MED ORDER — TRIAMCINOLONE ACETONIDE 0.1 % EX CREA: 1.0000 "application " | TOPICAL_CREAM | Freq: Three times a day (TID) | CUTANEOUS | Status: DC

## 2015-07-04 NOTE — Telephone Encounter (Signed)
Patient notified

## 2015-07-04 NOTE — Telephone Encounter (Signed)
Pt called wants to know if Dr. Sherie Don can call in a cream for itching for her. Pt stated she went to the ER with hives, fever, and strep throat. Pt stated she still has hives. Wants to know if there is a cream she can use during the day to help the itching. Pharm is [address] Thanks.

## 2015-07-04 NOTE — Telephone Encounter (Signed)
Rx sent; I'll also recommend an antihistamine like Benadryl or Zyrtec or Claritin or Allegra (plain without the decongestant) to help with itching and hives; hope she feels better soon

## 2015-07-04 NOTE — Telephone Encounter (Signed)
Routing to provider  

## 2015-07-09 DIAGNOSIS — F419 Anxiety disorder, unspecified: Secondary | ICD-10-CM | POA: Insufficient documentation
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
## 2015-07-09 NOTE — Assessment & Plan Note (Signed)
Managed by endocrinologist

## 2015-07-09 NOTE — Assessment & Plan Note (Signed)
With deep indentations in shoulders from bra straps; also with low back pain; refer to plastic surgeon for evaluation for possible reduction mammoplasty

## 2015-07-12 ENCOUNTER — Telehealth: Payer: Self-pay | Admitting: Family Medicine

## 2015-07-12 MED ORDER — FLUCONAZOLE 150 MG PO TABS: 150.0000 mg | ORAL_TABLET | Freq: Once | ORAL | Status: DC

## 2015-07-12 NOTE — Telephone Encounter (Signed)
She feels like she has a yeast infection beginning, she was on antibiotics. Wants to know if diflucan can be called in.

## 2015-07-12 NOTE — Telephone Encounter (Signed)
Done and sent  

## 2015-07-12 NOTE — Telephone Encounter (Signed)
Pt called and would like to have diflucan called in to [facility] [place]

## 2015-07-25 ENCOUNTER — Ambulatory Visit: Payer: Medicaid Other | Admitting: Family Medicine

## 2015-07-30 ENCOUNTER — Ambulatory Visit: Payer: Self-pay | Admitting: Family Medicine

## 2015-07-31 ENCOUNTER — Encounter: Payer: Self-pay | Admitting: Family Medicine

## 2015-08-15 ENCOUNTER — Ambulatory Visit (INDEPENDENT_AMBULATORY_CARE_PROVIDER_SITE_OTHER): Payer: Medicaid Other | Admitting: Family Medicine

## 2015-08-15 ENCOUNTER — Encounter: Payer: Self-pay | Admitting: Family Medicine

## 2015-08-15 DIAGNOSIS — J4520 Mild intermittent asthma, uncomplicated: Secondary | ICD-10-CM

## 2015-08-15 DIAGNOSIS — N62 Hypertrophy of breast: Secondary | ICD-10-CM | POA: Diagnosis not present

## 2015-08-15 DIAGNOSIS — E0590 Thyrotoxicosis, unspecified without thyrotoxic crisis or storm: Secondary | ICD-10-CM | POA: Diagnosis not present

## 2015-08-15 DIAGNOSIS — I10 Essential (primary) hypertension: Secondary | ICD-10-CM

## 2015-08-15 DIAGNOSIS — E041 Nontoxic single thyroid nodule: Secondary | ICD-10-CM | POA: Diagnosis not present

## 2015-08-15 DIAGNOSIS — E669 Obesity, unspecified: Secondary | ICD-10-CM

## 2015-08-15 DIAGNOSIS — E66813 Obesity, class 3: Secondary | ICD-10-CM | POA: Insufficient documentation

## 2015-08-15 DIAGNOSIS — E0500 Thyrotoxicosis with diffuse goiter without thyrotoxic crisis or storm: Secondary | ICD-10-CM

## 2015-08-15 DIAGNOSIS — L210 Seborrhea capitis: Secondary | ICD-10-CM

## 2015-08-15 MED ORDER — VITAMIN D (ERGOCALCIFEROL) 1.25 MG (50000 UNIT) PO CAPS: 50000.0000 [IU] | ORAL_CAPSULE | ORAL | Status: AC

## 2015-08-15 MED ORDER — BECLOMETHASONE DIPROPIONATE 80 MCG/ACT IN AERS: 2.0000 | INHALATION_SPRAY | Freq: Two times a day (BID) | RESPIRATORY_TRACT | Status: DC

## 2015-08-15 MED ORDER — ALBUTEROL SULFATE HFA 108 (90 BASE) MCG/ACT IN AERS
2.0000 | INHALATION_SPRAY | RESPIRATORY_TRACT | Status: DC | PRN
Start: 2015-08-15 — End: 2016-07-21

## 2015-08-15 MED ORDER — AMLODIPINE BESYLATE 10 MG PO TABS: 10.0000 mg | ORAL_TABLET | Freq: Every day | ORAL | Status: DC

## 2015-08-15 NOTE — Progress Notes (Signed)
   BP 124/82 mmHg  Pulse 70  Temp(Src) 98.1 F (36.7 C) (Oral)  Resp 14  Wt 231 lb (104.781 kg)  SpO2 99%  LMP 07/28/2015   Subjective:    Patient ID: Catherine Berg, female    DOB: 11/01/1980, 35 y.o.   MRN: [id number]  HPI: Catherine Berg is a 35 y.o. female  Chief Complaint  Patient presents with  . Medication Refill  . Hypertension    some headaches(possible due to birth control she was on )  . Asthma    having to use inhaler alot more. Cough, wheezing and SOB   . Depression    Relevant past medical, surgical, family and social history reviewed and updated as indicated. Interim medical history since our last visit reviewed. Allergies and medications reviewed and updated.  Review of Systems  Per HPI unless specifically indicated above     Objective:    BP 124/82 mmHg  Pulse 70  Temp(Src) 98.1 F (36.7 C) (Oral)  Resp 14  Wt 231 lb (104.781 kg)  SpO2 99%  LMP 07/28/2015  Wt Readings from Last 3 Encounters:  08/15/15 231 lb (104.781 kg)  07/01/15 230 lb (104.327 kg)  06/28/15 230 lb 6.4 oz (104.509 kg)    Physical Exam  Results for orders placed or performed during the hospital encounter of 07/01/15  Rapid Influenza A&B Antigens ([facility] only)  Result Value Ref Range   Influenza A [facility]) NOT DETECTED (A) NEGATIVE   Influenza B ([facility]) NOT DETECTED (A) NEGATIVE  Culture, group A strep  Result Value Ref Range   Specimen Description THROAT    Special Requests NONE    Culture      MODERATE GROWTH STREPTOCOCCUS GROUP C There is no known Penicillin Resistant Beta Streptococcus in the [place] For patients that are Penicillin-allergic, Erythromycin is 85-94% susceptible, and Clindamycin is 80% susceptible.  Contact Microbiology within 7 days if sensitivity testing is  required.      Report Status 07/04/2015 FINAL   Pregnancy, urine POC  Result Value Ref Range   Preg Test, Ur NEGATIVE NEGATIVE      Assessment & Plan:   Problem List Items Addressed This Visit     None       Follow up plan: No Follow-up on file.

## 2015-08-15 NOTE — Assessment & Plan Note (Signed)
Regulated by endocrinologist, Dr. Aliene AltesAbisogun

## 2015-08-15 NOTE — Assessment & Plan Note (Signed)
Planning for reduction mammoplasty; need to lose 30 pounds prior to surgery

## 2015-08-15 NOTE — Patient Instructions (Addendum)
Check out the information at familydoctor.org entitled "What It Takes to Lose Weight" Try to lose between 1-2 pounds per week by taking in fewer calories and burning off more calories You can succeed by limiting portions, limiting foods dense in calories and fat, becoming more active, and drinking 8 glasses of water a day (64 ounces) Don't skip meals, especially breakfast, as skipping meals may alter your metabolism Do not use over-the-counter weight loss pills or gimmicks that claim rapid weight loss A healthy BMI (or body mass index) is between 18.5 and 24.9 You can calculate your ideal BMI at the NIH website JobEconomics.huhttp://www.nhlbi.nih.gov/health/educational/lose_wt/BMI/bmicalc.htm Check out LimitLaws.com.cymyfitnesspal.com for calorie counting 150 minutes a week of walking or other gentle cardio  We'll refer you to a nutritionist If you have not heard anything from my staff in a week about any orders/referrals/studies from today, please contact us here to follow-up [phone]  We'll start the Rx vitamin D once a week for four weeks, then start over-the-counter vitamin D3 1000 iu daily  Try using selenium-containing shampoo once or twice a week for 4-6 weeks

## 2015-08-15 NOTE — Assessment & Plan Note (Signed)
Well-controlled today; refill medicine

## 2015-08-15 NOTE — Assessment & Plan Note (Signed)
Refer to nutritionist; see AVS; calorie count, increase walking

## 2015-08-15 NOTE — Assessment & Plan Note (Signed)
Managed by endocrinologist, Dr. Aliene AltesAbisogun

## 2015-08-15 NOTE — Assessment & Plan Note (Signed)
Managed by endocrinologist, Dr. Abisogun 

## 2015-08-15 NOTE — Progress Notes (Signed)
BP 124/82 mmHg  Pulse 70  Temp(Src) 98.1 F (36.7 C) (Oral)  Resp 14  Wt 231 lb (104.781 kg)  SpO2 99%  LMP 07/28/2015   Subjective:    Patient ID: Catherine Berg, female    DOB: 08/25/1980, 35 y.o.   MRN: [id number]  HPI: Catherine Berg is a 35 y.o. female  Chief Complaint  Patient presents with  . Medication Refill  . Hypertension    some headaches(possible due to birth control she was on )  . Asthma    having to use inhaler alot more. Cough, wheezing and SOB   . Depression    [facility] 08/15/2015 06/28/2015 04/05/2015 03/22/2015 02/22/2015  Decreased Interest 0 1 2 2 1   Down, Depressed, Hopeless 0 0 2 2 1   PHQ - 2 Score 0 1 4 4 2   Altered sleeping - - 2 3 3   Tired, decreased energy - - 2 3 3   Change in appetite - - 1 3 3   Feeling bad or failure about yourself  - - 1 3 1   Trouble concentrating - - 1 1 1   Moving slowly or fidgety/restless - - 0 1 0  Suicidal thoughts - - 0 0 0  PHQ-9 Score - - 11 18 13   Difficult doing work/chores - - Somewhat difficult Somewhat difficult Somewhat difficult   She saw the surgeon and is going to need to lose 30 pounds before she gets breast reduction; she is definitely a candidate This is about her peak weight; she has tried to lose weight before, maybe 5 or 6 years ago; had some issues with trouble breathing back then; doctor put her on furosemide back then and she was counting calories and she lost calories, then gained it back; got down to 170 pounds; she does not have a formal exercise plan, but is always up and moving  She has thyroid disorder and she is going to have that checked soon; bowels are a little sluggish; not as regular; dry skin  Her asthma is getting worse; using her rescue inhaler a lot more; more than twice a week; feels like there is phlegm in her throat ; she has drainage that is a part of it  Her BP is well-controlled today; needs refills of her BP medicine  Mood is doing well on SSRI  Relevant  past medical, surgical, family and social history reviewed and updated as indicated. Interim medical history since our last visit reviewed. Allergies and medications reviewed and updated.  Review of Systems Per HPI unless specifically indicated above     Objective:    BP 124/82 mmHg  Pulse 70  Temp(Src) 98.1 F (36.7 C) (Oral)  Resp 14  Wt 231 lb (104.781 kg)  SpO2 99%  LMP 07/28/2015  Wt Readings from Last 3 Encounters:  08/15/15 231 lb (104.781 kg)  07/01/15 230 lb (104.327 kg)  06/28/15 230 lb 6.4 oz (104.509 kg)   Body mass index is 40.93 kg/(m^2).  Physical Exam  Constitutional: She appears well-developed and well-nourished. No distress.  HENT:  Head: Normocephalic and atraumatic.  Raspy voice  Eyes: EOM are normal. No scleral icterus.  Neck: No thyromegaly present.  Cardiovascular: Normal rate, regular rhythm and normal heart sounds.   No murmur heard. Pulmonary/Chest: Effort normal and breath sounds normal. No respiratory distress. She has no wheezes.  Abdominal: She exhibits no distension.  Musculoskeletal: Normal range of motion. She exhibits no edema.  Neurological: She is alert. She exhibits  normal muscle tone.  Skin: Skin is warm and dry. She is not diaphoretic. No pallor.  Psychiatric: She has a normal mood and affect. Her behavior is normal. Judgment and thought content normal.        Assessment & Plan:   Problem List Items Addressed This Visit      Cardiovascular and Mediastinum   Essential hypertension, benign    Well-controlled today; refill medicine      Relevant Medications   amLODipine (NORVASC) 10 MG tablet     Respiratory   Asthma, mild intermittent, poorly controlled    Using rescue inhaler more than 2x a week; add QVAR, rinse mouth out after each use; call me if not improving      Relevant Medications   beclomethasone (QVAR) 80 MCG/ACT inhaler   albuterol (PROVENTIL HFA;VENTOLIN HFA) 108 (90 Base) MCG/ACT inhaler     Endocrine    Hyperthyroidism    Regulated by endocrinologist, Dr. Aliene Altes      Thyroid nodule    Managed by endocrinologist, Dr. Almond Lint' disease    Managed by endocrinologist, Dr. Aliene Altes        Other   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    Planning for reduction mammoplasty; need to lose 30 pounds prior to surgery      Morbid obesity (HCC) - Primary    Refer to nutritionist; see AVS; calorie count, increase walking      Relevant Orders   Amb ref to Medical Nutrition Therapy-MNT    Other Visit Diagnoses    Seborrhea capitis        try selenium-containing shampoo 1-2 x a week for 4-6 weeks, thorough rinsing after shampoo        Follow up plan: Return in about 1 month (around 09/14/2015) for weight management.  An after-visit summary was printed and given to the patient at check-out.  Please see the patient instructions which may contain other information and recommendations beyond what is mentioned above in the assessment and plan.  Meds ordered this encounter  Medications  . Vitamin D, Ergocalciferol, (DRISDOL) 50000 units CAPS capsule    Sig: Take 1 capsule (50,000 Units total) by mouth every 7 (seven) days.    Dispense:  4 capsule    Refill:  0  . beclomethasone (QVAR) 80 MCG/ACT inhaler    Sig: Inhale 2 puffs into the lungs 2 (two) times daily.    Dispense:  1 Inhaler    Refill:  5  . albuterol (PROVENTIL HFA;VENTOLIN HFA) 108 (90 Base) MCG/ACT inhaler    Sig: Inhale 2 puffs into the lungs every 4 (four) hours as needed for wheezing or shortness of breath.    Dispense:  1 Inhaler    Refill:  0  . amLODipine (NORVASC) 10 MG tablet    Sig: Take 1 tablet (10 mg total) by mouth daily.    Dispense:  30 tablet    Refill:  6    Pharmacist - cancel nifedipine    Orders Placed This Encounter  Procedures  . Amb ref to Medical Nutrition Therapy-MNT

## 2015-08-15 NOTE — Assessment & Plan Note (Signed)
Using rescue inhaler more than 2x a week; add QVAR, rinse mouth out after each use; call me if not improving

## 2015-08-27 ENCOUNTER — Encounter: Payer: Self-pay | Admitting: Family Medicine

## 2015-08-27 MED ORDER — PREDNISONE 10 MG PO TABS: ORAL_TABLET | ORAL | Status: AC

## 2015-08-28 ENCOUNTER — Other Ambulatory Visit: Payer: Self-pay | Admitting: Internal Medicine

## 2015-08-28 DIAGNOSIS — E0500 Thyrotoxicosis with diffuse goiter without thyrotoxic crisis or storm: Secondary | ICD-10-CM

## 2015-08-29 ENCOUNTER — Encounter: Payer: Self-pay | Admitting: Family Medicine

## 2015-08-29 DIAGNOSIS — L509 Urticaria, unspecified: Secondary | ICD-10-CM

## 2015-09-02 ENCOUNTER — Encounter: Payer: Self-pay | Admitting: Family Medicine

## 2015-09-02 DIAGNOSIS — L509 Urticaria, unspecified: Secondary | ICD-10-CM

## 2015-09-02 HISTORY — DX: Urticaria, unspecified: L50.9

## 2015-09-04 ENCOUNTER — Encounter: Payer: Self-pay | Admitting: Dietician

## 2015-09-04 ENCOUNTER — Encounter: Payer: Medicaid Other | Attending: Family Medicine | Admitting: Dietician

## 2015-09-04 VITALS — Ht 64.0 in | Wt 230.3 lb

## 2015-09-04 DIAGNOSIS — E669 Obesity, unspecified: Secondary | ICD-10-CM | POA: Insufficient documentation

## 2015-09-04 NOTE — Progress Notes (Signed)
Medical Nutrition Therapy: Visit start time: 0900  end time: 1000  Assessment:  Diagnosis: obesity  Past medical history: Graves' disease, HTN Psychosocial issues/ stress concerns: patient reports moderate stress; taking medication for anxiety                 She reports grieving the death of her mother in the past year.  Preferred learning method:  . Visual  Current weight: 230.3lbs  Height: 5'4" Medications, supplements: reviewed list in chart with patient  Progress and evaluation: Patient reports weight gain in the past year, due to increased appetite from Graves' disease, and due to dealing with her mother's death.         She has begun using MyFitnessPal to track her food intake, with goal of 1620kcal daily.          She has a personal goal of weight loss to improve her health for the sake of her children.   Physical activity: walking 30 minutes, 1-2 times per day, 3-5 days per week  Dietary Intake:  Usual eating pattern includes 3 meals and 0 snacks per day. Dining out frequency: 6 meals per week.  Breakfast: recently 2 eggs, 2slices [place]key bacon on whole grain flatbread, coffee Snack: none Lunch: 5/2 2Tbsp (measured) tuna salad on whole grain flatbread Snack: none Supper: 5/2 grilled chicken strips lemon-seasoned, 1 cob of corn, mixed greens salad Snack: none Beverages: mostly water or sugar free flavored water.  Nutrition Care Education: Topics covered: weight management Basic nutrition: basic food groups, appropriate nutrient balance, appropriate meal and snack schedule, general nutrition guidelines    Weight control: benefits of weight control, importance of limiting hidden fat and sugar in foods; 1600kcal meal plan with 45%CHO, 25%protein, 30% fat; appropriate snacking and healthy snack choices; meal/ snack timing Other lifestyle changes:  benefits of tracking food and exercise; goals for exercise; maintaining motivation  Nutritional Diagnosis:  [place]-3.3 Overweight/obesity  As related to history of excess caloric intake, in part due to Graves disease, and intactivity.  As evidenced by patient report.  Intervention: Instruction as noted above.   No further significant changes at this time, patient has made healthy changes already and feels positive about current eating pattern.   Set minor goals with patient input.   Commended patient for efforts made thus far.  Education Materials given:  . Food lists/ Planning A Balanced Meal . Sample meal pattern/ menus: Quick and Healthy Meal Ideas . Goals/ instructions  Learner/ who was taught:  . Patient   Level of understanding: Marland Kitchen. Verbalizes/ demonstrates competency  Demonstrated degree of understanding via:   Teach back Learning barriers: . None  Willingness to learn/ readiness for change: . Eager, change in progress  Monitoring and Evaluation:  Dietary intake, exercise, and body weight      follow up: 10/02/15

## 2015-09-04 NOTE — Patient Instructions (Signed)
   Continue with healthy food choices and controlled portions, great job!  Keep up regular exercise -- if you walk 2 miles in 30 minutes, you can do a 5K in 45 minutes.   You can add a fruit to your meals for healthy carbohydrate.   Allow for a small evening snack if you are hungry; such as graham crackers with small amount of peanut butter, yogurt, fruit with a few nuts or cheese, or granola bar with several grams of fiber.

## 2015-09-05 ENCOUNTER — Encounter
Admission: RE | Admit: 2015-09-05 | Discharge: 2015-09-05 | Disposition: A | Discharge status: Home / Self Care | Payer: Medicaid Other | Source: Ambulatory Visit | Attending: Internal Medicine | Admitting: Internal Medicine

## 2015-09-05 DIAGNOSIS — E0500 Thyrotoxicosis with diffuse goiter without thyrotoxic crisis or storm: Secondary | ICD-10-CM | POA: Diagnosis not present

## 2015-09-05 MED ORDER — SODIUM IODIDE I-123 3.7 MBQ PO CAPS
161.0000 | ORAL_CAPSULE | Freq: Once | ORAL | Status: AC
  Administered 2015-09-05: 161 via ORAL

## 2015-09-06 ENCOUNTER — Encounter
Admission: RE | Admit: 2015-09-06 | Discharge: 2015-09-06 | Disposition: A | Discharge status: Home / Self Care | Payer: Medicaid Other | Source: Ambulatory Visit | Attending: Internal Medicine | Admitting: Internal Medicine

## 2015-09-11 ENCOUNTER — Encounter
Admission: RE | Admit: 2015-09-11 | Discharge: 2015-09-11 | Disposition: A | Discharge status: Home / Self Care | Payer: Medicaid Other | Source: Ambulatory Visit | Attending: Internal Medicine | Admitting: Internal Medicine

## 2015-09-11 DIAGNOSIS — E0500 Thyrotoxicosis with diffuse goiter without thyrotoxic crisis or storm: Secondary | ICD-10-CM

## 2015-09-11 MED ORDER — SODIUM IODIDE I 131 CAPSULE
21.0800 | Freq: Once | INTRAVENOUS | Status: AC | PRN
  Administered 2015-09-11: 21.08 via ORAL

## 2015-09-13 ENCOUNTER — Ambulatory Visit (INDEPENDENT_AMBULATORY_CARE_PROVIDER_SITE_OTHER): Payer: Medicaid Other | Admitting: Family Medicine

## 2015-09-13 ENCOUNTER — Encounter: Payer: Self-pay | Admitting: Family Medicine

## 2015-09-13 VITALS — BP 122/80 | HR 95 | Temp 98.6°F | Resp 14 | Ht 64.0 in | Wt 227.0 lb

## 2015-09-13 DIAGNOSIS — I10 Essential (primary) hypertension: Secondary | ICD-10-CM

## 2015-09-13 DIAGNOSIS — E0500 Thyrotoxicosis with diffuse goiter without thyrotoxic crisis or storm: Secondary | ICD-10-CM

## 2015-09-13 DIAGNOSIS — E669 Obesity, unspecified: Secondary | ICD-10-CM

## 2015-09-13 NOTE — Patient Instructions (Addendum)
Keep up the amazing work!! Check out [employer] for fun recipes Black bean brownies are one of my favorites Check out [employer] 7-Minute Workout

## 2015-09-13 NOTE — Progress Notes (Signed)
BP 122/80 mmHg  Pulse 95  Temp(Src) 98.6 F (37 C) (Oral)  Resp 14  Ht '5\' 4"'  (1.626 m)  Wt 227 lb (102.967 kg)  BMI 38.95 kg/m2  SpO2 99%  LMP 09/02/2015   Subjective:    Patient ID: Catherine Berg, female    DOB: 1980/06/15, 35 y.o.   MRN: [id number]  HPI: Catherine Berg is a 35 y.o. female  Chief Complaint  Patient presents with  . Follow-up    1 month  . Obesity   Patient is here for follow-up of obesity, workin gon weight loss She has lost four pounds since last visit four weeks ago Met with Dr. Burnard Bunting first and downloaded myfitnesspal Also downloaded my walk and that syncs with the diet app Drinking 32 ounces of water a day Has been cutting back on the portions, portion control; if eating out, turned more to chicken She has learned that if she portion-controls, she doesn't have to take everything away; watching and measuring Walking 1-3 miles a day Little bit better energy Dr. Burnard Bunting has the radioactive iodine treatment on Wednesday  Lexapro 10 mg still working well  Depression screen [facility] 2/9 09/04/2015 08/15/2015 06/28/2015 04/05/2015 03/22/2015  Decreased Interest 0 0 '1 2 2  ' Down, Depressed, Hopeless 0 0 0 2 2  PHQ - 2 Score 0 0 '1 4 4  ' Altered sleeping - - - 2 3  Tired, decreased energy - - - 2 3  Change in appetite - - - 1 3  Feeling bad or failure about yourself  - - - 1 3  Trouble concentrating - - - 1 1  Moving slowly or fidgety/restless - - - 0 1  Suicidal thoughts - - - 0 0  PHQ-9 Score - - - 11 18  Difficult doing work/chores - - - Somewhat difficult Somewhat difficult    GAD 7 : Generalized Anxiety Score 06/28/2015  Nervous, Anxious, on Edge 2  Control/stop worrying 2  Worry too much - different things 1  Trouble relaxing 1  Restless 1  Easily annoyed or irritable 0  Afraid - awful might happen 1  Total GAD 7 Score 8  Anxiety Difficulty Not difficult at all    Relevant past medical, surgical, family and social history reviewed Past Medical  History  Diagnosis Date  . Anxiety   . Low serum vitamin D   . Hypertension   . Reflux   . Obesity   . Vitamin D deficiency disease   . Vitamin B12 deficiency   . IFG (impaired fasting glucose)   . Abnormal thyroid blood test   . 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 History of abnormal cervical Pap smear   . History of cervical polypectomy   . Pregnancy induced hypertension     with 1st pregnancy, normal pressure with 2nd  . Thyroid disease   . Graves disease   . Hives 09/02/2015   Past Surgical History  Procedure Laterality Date  . Cervical polypectomy    . Cesarean section      x 2  . Colon surgery     Family History  Problem Relation Age of Onset  . Heart attack Mother 47  . Hypertension Mother   . Asthma Mother   . Cancer Mother     laryngeal  . Diabetes Mother     pre diabetic  . Heart disease Mother   . Mental illness Mother   . Alcohol abuse Mother   .  Drug abuse Mother   . Depression Mother   . Anxiety disorder Mother   . Bipolar disorder Mother   . Learning disabilities Brother   . ADD / ADHD Brother   . Asthma Son   . Heart disease Maternal Grandmother     heart attack, pacemaker  . Heart attack Maternal Grandmother   . Hypertension Maternal Grandmother   . Hypertension Maternal Grandfather   . Heart disease Paternal Grandmother     couple of major open heart surgeries, leaking valves  . Hypertension Paternal Grandmother   . Hypertension Paternal Grandfather   . Hyperlipidemia Brother   . Alcohol abuse Father    Social History  Substance Use Topics  . Smoking status: Never Smoker   . Smokeless tobacco: Never Used  . Alcohol Use: No   Interim medical history since last visit reviewed. Allergies and medications reviewed  Review of Systems Per HPI unless specifically indicated above     Objective:    BP 122/80 mmHg  Pulse 95  Temp(Src) 98.6 F (37 C) (Oral)  Resp 14  Ht '5\' 4"'  (1.626 m)  Wt 227 lb (102.967 kg)   BMI 38.95 kg/m2  SpO2 99%  LMP 09/02/2015  Wt Readings from Last 3 Encounters:  09/13/15 227 lb (102.967 kg)  09/04/15 230 lb 4.8 oz (104.463 kg)  08/15/15 231 lb (104.781 kg)    Physical Exam  Constitutional: She appears well-developed and well-nourished. No distress.  Eyes: EOM are normal. No scleral icterus.  Neck: No thyromegaly present.  Cardiovascular: Normal rate.   Pulmonary/Chest: Effort normal.  Abdominal: She exhibits no distension.  Skin: No pallor.  Psychiatric: She has a normal mood and affect. Her behavior is normal. Judgment and thought content normal.      Assessment & Plan:   Problem List Items Addressed This Visit      Cardiovascular and Mediastinum   Essential hypertension, benign - Primary    Controlled; my hope is that as she loses weight, we can decrease her antihypertensives        Endocrine   Graves' disease    Followed by endo, going to have radioactive treatment next week        Other   Obesity    BMI is now under 36, so she is classified as obese, not morbidly obese; encoruaged her to continue her portion control, limiting high fat foods, empty calories when she can; discussed recipe ideas, encouraged her to continue walking; motivation discussed         Follow up plan: Return in about 8 weeks (around 11/08/2015) for follow-up.  An after-visit summary was printed and given to the patient at [facility].  Please see the patient instructions which may contain other information and recommendations beyond what is mentioned above in the assessment and plan.   Face-to-face time with patient was more than 25 minutes, >50% time spent counseling and coordination of care

## 2015-09-14 NOTE — Assessment & Plan Note (Signed)
Followed by endo, going to have radioactive treatment next week

## 2015-09-14 NOTE — Assessment & Plan Note (Signed)
BMI is now under 40, so she is classified as obese, not morbidly obese; encoruaged her to continue her portion control, limiting high fat foods, empty calories when she can; discussed recipe ideas, encouraged her to continue walking; motivation discussed

## 2015-09-14 NOTE — Assessment & Plan Note (Addendum)
Controlled; my hope is that as she loses weight, we can decrease her antihypertensives

## 2015-09-16 ENCOUNTER — Telehealth: Payer: Self-pay | Admitting: Family Medicine

## 2015-09-16 MED ORDER — POTASSIUM CHLORIDE ER 10 MEQ PO TBCR: 10.0000 meq | EXTENDED_RELEASE_TABLET | Freq: Every day | ORAL | Status: DC | PRN

## 2015-09-16 MED ORDER — FUROSEMIDE 20 MG PO TABS: 20.0000 mg | ORAL_TABLET | Freq: Every day | ORAL | Status: DC | PRN

## 2015-09-16 NOTE — Telephone Encounter (Signed)
I spoke to pt, stated swelling around 8:30 am; didn't go down this morning Likely related to naproxen and amlodipine Use lasix with KCl daily for 2-3 days PRN

## 2015-09-16 NOTE — Telephone Encounter (Signed)
FYI; Pt wanted Dr Sherie DonLada to know that she woke up with her feet swollen. She states Dr Sherie DonLada will know what she is talking about.

## 2015-09-24 ENCOUNTER — Encounter: Payer: Self-pay | Admitting: Family Medicine

## 2015-09-24 DIAGNOSIS — R600 Localized edema: Secondary | ICD-10-CM

## 2015-09-24 DIAGNOSIS — R079 Chest pain, unspecified: Secondary | ICD-10-CM

## 2015-09-25 ENCOUNTER — Emergency Department: Payer: Medicaid Other

## 2015-09-25 ENCOUNTER — Encounter: Payer: Self-pay | Admitting: Emergency Medicine

## 2015-09-25 ENCOUNTER — Emergency Department
Admission: EM | Admit: 2015-09-25 | Discharge: 2015-09-25 | Disposition: A | Discharge status: Home / Self Care | Payer: Medicaid Other | Attending: Emergency Medicine | Admitting: Emergency Medicine

## 2015-09-25 DIAGNOSIS — Z79899 Other long term (current) drug therapy: Secondary | ICD-10-CM | POA: Insufficient documentation

## 2015-09-25 DIAGNOSIS — R600 Localized edema: Secondary | ICD-10-CM | POA: Insufficient documentation

## 2015-09-25 DIAGNOSIS — R079 Chest pain, unspecified: Secondary | ICD-10-CM | POA: Diagnosis not present

## 2015-09-25 DIAGNOSIS — I10 Essential (primary) hypertension: Secondary | ICD-10-CM | POA: Insufficient documentation

## 2015-09-25 DIAGNOSIS — Z91040 Latex allergy status: Secondary | ICD-10-CM | POA: Diagnosis not present

## 2015-09-25 DIAGNOSIS — J4520 Mild intermittent asthma, uncomplicated: Secondary | ICD-10-CM | POA: Diagnosis not present

## 2015-09-25 LAB — CBC
HCT: 36.6 % (ref 35.0–47.0)
Hemoglobin: 12.3 g/dL (ref 12.0–16.0)
MCH: 27.8 pg (ref 26.0–34.0)
MCHC: 33.5 g/dL (ref 32.0–36.0)
MCV: 82.8 fL (ref 80.0–100.0)
Platelets: 342 10*3/uL (ref 150–440)
RBC: 4.42 MIL/uL (ref 3.80–5.20)
RDW: 14.2 % (ref 11.5–14.5)
WBC: 8.3 10*3/uL (ref 3.6–11.0)

## 2015-09-25 LAB — BASIC METABOLIC PANEL
Anion gap: 6 (ref 5–15)
BUN: 12 mg/dL (ref 6–20)
CO2: 25 mmol/L (ref 22–32)
Calcium: 9.5 mg/dL (ref 8.9–10.3)
Chloride: 105 mmol/L (ref 101–111)
Creatinine, Ser: 0.76 mg/dL (ref 0.44–1.00)
GFR calc Af Amer: >60 mL/min (ref >60)
GFR calc non Af Amer: >60 mL/min (ref >60)
Glucose, Bld: 122 mg/dL — ABNORMAL HIGH (ref 65–99)
Potassium: 3.4 mmol/L — ABNORMAL LOW (ref 3.5–5.1)
Sodium: 136 mmol/L (ref 135–145)

## 2015-09-25 LAB — TROPONIN I
Troponin I: <0.03 ng/mL (ref <0.031)
Troponin I: <0.03 ng/mL (ref <0.031)

## 2015-09-25 LAB — FIBRIN DERIVATIVES D-DIMER (ARMC ONLY): Fibrin derivatives D-dimer (ARMC): 444 (ref 0–499)

## 2015-09-25 NOTE — ED Provider Notes (Signed)
[facility] Emergency Department Provider Note  ____________________________________________  Time seen: 1:10 AM  I have reviewed the triage vital signs and the nursing notes.   HISTORY  Chief Complaint Chest Pain      HPI Catherine Berg is a 35 y.o. female presents per EMS with history of 8 out of 10 central chest pain with onset while going to sleep tonight. Patient states that she currently has no pain that has completely resolved. Patient denies any dyspnea no nausea no vomiting or diaphoresis. Patient does admit to bilateral lower extremity swelling over the past 2 weeks which she was prescribed Lasix by her primary care provider. Patient admits to a family history of myocardial infarction in her mother who she "had in her 42s".   Past Medical History  Diagnosis Date  . Anxiety   . Low serum vitamin D   . Hypertension   . Reflux   . Obesity   . Vitamin D deficiency disease   . Vitamin B12 deficiency   . IFG (impaired fasting glucose)   . Abnormal thyroid blood test   . 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 History of abnormal cervical Pap smear   . History of cervical polypectomy   . Pregnancy induced hypertension     with 1st pregnancy, normal pressure with 2nd  . Thyroid disease   . Graves disease   . Hives 09/02/2015    Patient Active Problem List   Diagnosis Date Noted  . Hives 09/02/2015  . Obesity 08/15/2015  . Anxiety 07/09/2015  . 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 Allergic reaction 05/07/2015  . Graves' disease 03/26/2015  . Thyroid nodule 02/01/2015  . Hyperthyroidism 12/25/2014  . Essential hypertension, benign 12/24/2014  . Vitamin D deficiency disease   . Vitamin B12 deficiency   . IFG (impaired fasting glucose)   . History of abnormal cervical Pap smear   . Asthma, mild intermittent, poorly controlled 10/09/2013    Past Surgical History  Procedure Laterality Date  . Cervical  polypectomy    . Cesarean section      x 2  . Colon surgery      Current Outpatient Rx  Name  Route  Sig  Dispense  Refill  . albuterol (PROVENTIL HFA;VENTOLIN HFA) 108 (90 Base) MCG/ACT inhaler   Inhalation   Inhale 2 puffs into the lungs every 4 (four) hours as needed for wheezing or shortness of breath.   1 Inhaler   0   . amLODipine (NORVASC) 10 MG tablet   Oral   Take 1 tablet (10 mg total) by mouth daily.   30 tablet   6     Pharmacist - cancel nifedipine   . beclomethasone (QVAR) 80 MCG/ACT inhaler   Inhalation   Inhale 2 puffs into the lungs 2 (two) times daily.   1 Inhaler   5   . EPINEPHrine 0.3 mg/0.3 mL IJ SOAJ injection   Intramuscular   Inject 0.3 mLs (0.3 mg total) into the muscle once.   1 Device   1   . escitalopram (LEXAPRO) 10 MG tablet   Oral   Take 1.5 tablets (15 mg total) by mouth daily.   45 tablet   1     We're increasing this   . furosemide (LASIX) 20 MG tablet   Oral   Take 1 tablet (20 mg total) by mouth daily as needed.   15 tablet   0   . hydrOXYzine (  VISTARIL) 25 MG capsule   Oral   Take 1 capsule (25 mg total) by mouth every 6 (six) hours as needed.   60 capsule   0   . naproxen (NAPROSYN) 375 MG tablet   Oral   Take 1 tablet (375 mg total) by mouth 2 (two) times daily with a meal. No additional NSAIDs   60 tablet   0   . potassium chloride (K-DUR) 10 MEQ tablet   Oral   Take 1 tablet (10 mEq total) by mouth daily as needed.   15 tablet   0   . propylthiouracil (PTU) 50 MG tablet   Oral   Take 200 mg by mouth 3 (three) times daily. Reported on 06/28/2015         . triamcinolone cream (KENALOG) 0.1 %   Topical   Apply 1 application topically 3 (three) times daily. PRN itching; too strong for face, groin, underarms   45 g   0   . vitamin B-12 (CYANOCOBALAMIN) 500 MCG tablet   Oral   Take 500 mcg by mouth daily.           Allergies Latex and Shellfish allergy  Family History  Problem Relation Age of  Onset  . Heart attack Mother [id number]  . Hypertension Mother   . Asthma Mother   . Cancer Mother     laryngeal  . Diabetes Mother     pre diabetic  . Heart disease Mother   . Mental illness Mother   . Alcohol abuse Mother   . Drug abuse Mother   . Depression Mother   . Anxiety disorder Mother   . Bipolar disorder Mother   . Learning disabilities Brother   . ADD / ADHD Brother   . Asthma Son   . Heart disease Maternal Grandmother     heart attack, pacemaker  . Heart attack Maternal Grandmother   . Hypertension Maternal Grandmother   . Hypertension Maternal Grandfather   . Heart disease Paternal Grandmother     couple of major open heart surgeries, leaking valves  . Hypertension Paternal Grandmother   . Hypertension Paternal Grandfather   . Hyperlipidemia Brother   . Alcohol abuse Father     Social History Social History  Substance Use Topics  . Smoking status: Never Smoker   . Smokeless tobacco: Never Used  . Alcohol Use: No    Review of Systems  Constitutional: Negative for fever. Eyes: Negative for visual changes. ENT: Negative for sore throat. Cardiovascular: Positive for chest pain. Respiratory: Negative for shortness of breath. Gastrointestinal: Negative for abdominal pain, vomiting and diarrhea. Genitourinary: Negative for dysuria. Musculoskeletal: Negative for back pain.Positive bilateral leg swelling Skin: Negative for rash. Neurological: Negative for headaches, focal weakness or numbness.   10-point ROS otherwise negative.  ____________________________________________   PHYSICAL EXAM:  VITAL SIGNS: ED Triage Vitals  Enc Vitals Group     BP 09/25/15 0109 141/86 mmHg     Pulse Rate 09/25/15 0109 93     Resp 09/25/15 0109 15     Temp --      Temp Source 09/25/15 0109 Oral     SpO2 09/25/15 0106 99 %     Weight 09/25/15 0109 223 lb (101.152 kg)     Height 09/25/15 0109 5\' 3"  (1.6 m)     Head Cir --      Peak Flow --      Pain Score --      Pain  Loc --  Pain Edu? --      Excl. in GC? --      Constitutional: Alert and oriented. Well appearing and in no distress. Eyes: Conjunctivae are normal. PERRL. Normal extraocular movements. ENT   Head: Normocephalic and atraumatic.   Nose: No congestion/rhinnorhea.   Mouth/Throat: Mucous membranes are moist.   Neck: No stridor. Hematological/Lymphatic/Immunilogical: No cervical lymphadenopathy. Cardiovascular: Normal rate, regular rhythm. Normal and symmetric distal pulses are present in all extremities. No murmurs, rubs, or gallops. Respiratory: Normal respiratory effort without tachypnea nor retractions. Breath sounds are clear and equal bilaterally. No wheezes/rales/rhonchi. Gastrointestinal: Soft and nontender. No distention. There is no CVA tenderness. Genitourinary: deferred Musculoskeletal: Nontender with normal range of motion in all extremities. No joint effusions.  No lower extremity tenderness nor edema. Neurologic:  Normal speech and language. No gross focal neurologic deficits are appreciated. Speech is normal.  Skin:  Skin is warm, dry and intact. No rash noted. Psychiatric: Mood and affect are normal. Speech and behavior are normal. Patient exhibits appropriate insight and judgment.  ____________________________________________    LABS (pertinent positives/negatives)  Labs Reviewed  BASIC METABOLIC PANEL - Abnormal; Notable for the following:    Potassium 3.4 (*)    Glucose, Bld 122 (*)    All other components within normal limits  CBC  TROPONIN I  FIBRIN DERIVATIVES D-DIMER ([facility] ONLY)  TROPONIN I     ____________________________________________   EKG  ED ECG REPORT I, [place] Amberlin Utke, the attending physician, personally viewed and interpreted this ECG.   Date: 09/25/2015  EKG Time: 1:09 AM  Rate: 93  Rhythm: Normal sinus rhythm  Axis: Normal  Intervals: Normal  ST&T Change: None   ____________________________________________     RADIOLOGY  DG Chest Port 1 View (Final result) Result time: 09/25/15 01:33:26   Final result by Rad Results In Interface (09/25/15 01:33:26)   Narrative:   CLINICAL DATA: Acute onset of central chest pain. Initial encounter.  EXAM: PORTABLE CHEST 1 VIEW  COMPARISON: Chest radiograph performed 10/28/2010  FINDINGS: The lungs are well-aerated and clear. There is no evidence of focal opacification, pleural effusion or pneumothorax.  The cardiomediastinal silhouette is within normal limits. No acute osseous abnormalities are seen.  IMPRESSION: No acute cardiopulmonary process seen.   Electronically Signed By: Roanna Raider M.D. On: 09/25/2015 01:33          INITIAL IMPRESSION / ASSESSMENT AND PLAN / ED COURSE  Pertinent labs & imaging results that were available during my care of the patient were reviewed by me and considered in my medical decision making (see chart for details).  D dimer negative PERC 0 patient. Troponin negative x 2  ____________________________________________   FINAL CLINICAL IMPRESSION(S) / ED DIAGNOSES  Final diagnoses:  Chest pain, unspecified chest pain type      Darci Current, MD 09/25/15 [phone]

## 2015-09-25 NOTE — Discharge Instructions (Signed)
Nonspecific Chest Pain  °Chest pain can be caused by many different conditions. There is always a chance that your pain could be related to something serious, such as a heart attack or a blood clot in your lungs. Chest pain can also be caused by conditions that are not life-threatening. If you have chest pain, it is very important to follow up with your health care provider. °CAUSES  °Chest pain can be caused by: °· Heartburn. °· Pneumonia or bronchitis. °· Anxiety or stress. °· Inflammation around your heart (pericarditis) or lung (pleuritis or pleurisy). °· A blood clot in your lung. °· A collapsed lung (pneumothorax). It can develop suddenly on its own (spontaneous pneumothorax) or from trauma to the chest. °· Shingles infection (varicella-zoster virus). °· Heart attack. °· Damage to the bones, muscles, and cartilage that make up your chest wall. This can include: °¨ Bruised bones due to injury. °¨ Strained muscles or cartilage due to frequent or repeated coughing or overwork. °¨ Fracture to one or more ribs. °¨ Sore cartilage due to inflammation (costochondritis). °RISK FACTORS  °Risk factors for chest pain may include: °· Activities that increase your risk for trauma or injury to your chest. °· Respiratory infections or conditions that cause frequent coughing. °· Medical conditions or overeating that can cause heartburn. °· Heart disease or family history of heart disease. °· Conditions or health behaviors that increase your risk of developing a blood clot. °· Having had chicken pox (varicella zoster). °SIGNS AND SYMPTOMS °Chest pain can feel like: °· Burning or tingling on the surface of your chest or deep in your chest. °· Crushing, pressure, aching, or squeezing pain. °· Dull or sharp pain that is worse when you move, cough, or take a deep breath. °· Pain that is also felt in your back, neck, shoulder, or arm, or pain that spreads to any of these areas. °Your chest pain may come and go, or it may stay  constant. °DIAGNOSIS °Lab tests or other studies may be needed to find the cause of your pain. Your health care provider may have you take a test called an ambulatory ECG (electrocardiogram). An ECG records your heartbeat patterns at the time the test is performed. You may also have other tests, such as: °· Transthoracic echocardiogram (TTE). During echocardiography, sound waves are used to create a picture of all of the heart structures and to look at how blood flows through your heart. °· Transesophageal echocardiogram (TEE). This is a more advanced imaging test that obtains images from inside your body. It allows your health care provider to see your heart in finer detail. °· Cardiac monitoring. This allows your health care provider to monitor your heart rate and rhythm in real time. °· Holter monitor. This is a portable device that records your heartbeat and can help to diagnose abnormal heartbeats. It allows your health care provider to track your heart activity for several days, if needed. °· Stress tests. These can be done through exercise or by taking medicine that makes your heart beat more quickly. °· Blood tests. °· Imaging tests. °TREATMENT  °Your treatment depends on what is causing your chest pain. Treatment may include: °· Medicines. These may include: °¨ Acid blockers for heartburn. °¨ Anti-inflammatory medicine. °¨ Pain medicine for inflammatory conditions. °¨ Antibiotic medicine, if an infection is present. °¨ Medicines to dissolve blood clots. °¨ Medicines to treat coronary artery disease. °· Supportive care for conditions that do not require medicines. This may include: °¨ Resting. °¨ Applying heat   or cold packs to injured areas. °¨ Limiting activities until pain decreases. °HOME CARE INSTRUCTIONS °· If you were prescribed an antibiotic medicine, finish it all even if you start to feel better. °· Avoid any activities that bring on chest pain. °· Do not use any tobacco products, including  cigarettes, chewing tobacco, or electronic cigarettes. If you need help quitting, ask your health care provider. °· Do not drink alcohol. °· Take medicines only as directed by your health care provider. °· Keep all follow-up visits as directed by your health care provider. This is important. This includes any further testing if your chest pain does not go away. °· If heartburn is the cause for your chest pain, you may be told to keep your head raised (elevated) while sleeping. This reduces the chance that acid will go from your stomach into your esophagus. °· Make lifestyle changes as directed by your health care provider. These may include: °¨ Getting regular exercise. Ask your health care provider to suggest some activities that are safe for you. °¨ Eating a heart-healthy diet. A registered dietitian can help you to learn healthy eating options. °¨ Maintaining a healthy weight. °¨ Managing diabetes, if necessary. °¨ Reducing stress. °SEEK MEDICAL CARE IF: °· Your chest pain does not go away after treatment. °· You have a rash with blisters on your chest. °· You have a fever. °SEEK IMMEDIATE MEDICAL CARE IF:  °· Your chest pain is worse. °· You have an increasing cough, or you cough up blood. °· You have severe abdominal pain. °· You have severe weakness. °· You faint. °· You have chills. °· You have sudden, unexplained chest discomfort. °· You have sudden, unexplained discomfort in your arms, back, neck, or jaw. °· You have shortness of breath at any time. °· You suddenly start to sweat, or your skin gets clammy. °· You feel nauseous or you vomit. °· You suddenly feel light-headed or dizzy. °· Your heart begins to beat quickly, or it feels like it is skipping beats. °These symptoms may represent a serious problem that is an emergency. Do not wait to see if the symptoms will go away. Get medical help right away. Call your local emergency services (911 in the [place] Do not drive yourself to the hospital. °  °This  information is not intended to replace advice given to you by your health care provider. Make sure you discuss any questions you have with your health care provider. °  °Document Released: 01/28/2005 Document Revised: 05/11/2014 Document Reviewed: 11/24/2013 °Elsevier Interactive Patient Education ©2016 [employer] ° °

## 2015-09-25 NOTE — ED Notes (Signed)
Called to check on status of second Troponin draw, I was told it should be ready about 0500.

## 2015-09-25 NOTE — ED Notes (Signed)
Pt arrived by EMS from home with c/o of central chest pain that started when pt was going to sleep. EMS reports pt was 8/10 pain on arrival, pt is 0/10 pain on arrival to ED. Pt reports she was placed on lasix 2 weeks ago for bilateral leg swelling.

## 2015-10-02 ENCOUNTER — Encounter: Payer: Medicaid Other | Admitting: Dietician

## 2015-10-02 ENCOUNTER — Encounter: Payer: Self-pay | Admitting: Dietician

## 2015-10-02 ENCOUNTER — Encounter: Payer: Self-pay | Admitting: Family Medicine

## 2015-10-02 VITALS — Ht 63.0 in | Wt 221.6 lb

## 2015-10-02 DIAGNOSIS — E669 Obesity, unspecified: Secondary | ICD-10-CM

## 2015-10-02 NOTE — Patient Instructions (Signed)
Continue with current eating pattern and exercise regimen.

## 2015-10-02 NOTE — Progress Notes (Signed)
Medical Nutrition Therapy: Visit start time: 0930  end time: 1000  Assessment:  Diagnosis: Graves disease, obesity Medical history changes: no changes Psychosocial issues/ stress concerns: none  Current weight: 221.6lbs  Height: 5'3" Medications, supplement changes: no changes Progress and evaluation: Patient has lost 9-10lbs in the past 4 weeks  Physical activity: walking 40-45 minutes 5-6 times per week.   Dietary Intake:  Usual eating pattern includes 3 meals and 0-1 snacks per day. Dining out frequency: not assessed today.  Breakfast: eggs, [place] bacon, flatbread Snack: none Lunch: tuna usually with flatbread, cucumbers and fruit; or grilled chicken and vegetables Snack: none Supper: grilled chicken, sweet potato or brown rice, free veg.  Snack: occasionally peanut butter in small portion Beverages: water or sugar free flavored water  Nutrition Care Education: Topics covered: weight management Weight control: reviewed patient's current eating pattern, kcal needs as weight loss progresses, weight loss plateaus Advanced nutrition: food label reading: guidelines for choosing low sodium foods Other lifestyle changes: goals for exercise, i.e. adding strength-building exercises.   Nutritional Diagnosis:  Brodnax-3.3 Overweight/obesity As related to Graves disease, history of excess calories and inactivity.  As evidenced by patient report.  Intervention: Discussion as noted above.   Celebrated patient's success thus far.    No additional goals at this time, she has a good mindset in regards to healthy weight loss.    She is also reading blogs on [employer]itness[employer] for additional information and support.  Education Materials given: none today  Learner/ who was taught:  . Patient   Level of understanding: Marland Kitchen. Verbalizes/ demonstrates competency  Demonstrated degree of understanding via:   Teach back Learning barriers: . None  Willingness to learn/ readiness for change: . Eager, change  in progress  Monitoring and Evaluation:  Dietary intake, exercise, and body weight      follow up: 11/20/15

## 2015-10-03 ENCOUNTER — Ambulatory Visit
Admission: RE | Admit: 2015-10-03 | Discharge: 2015-10-03 | Disposition: A | Discharge status: Home / Self Care | Payer: Medicaid Other | Source: Ambulatory Visit | Attending: Family Medicine | Admitting: Family Medicine

## 2015-10-03 DIAGNOSIS — R600 Localized edema: Secondary | ICD-10-CM | POA: Diagnosis not present

## 2015-10-03 DIAGNOSIS — R079 Chest pain, unspecified: Secondary | ICD-10-CM | POA: Diagnosis not present

## 2015-10-03 NOTE — Progress Notes (Signed)
*  PRELIMINARY RESULTS* Echocardiogram 2D Echocardiogram has been performed.  Georgann HousekeeperJerry R Hege 10/03/2015, 10:12 AM

## 2015-10-29 ENCOUNTER — Encounter: Payer: Self-pay | Admitting: Family Medicine

## 2015-10-30 MED ORDER — NITROFURANTOIN MONOHYD MACRO 100 MG PO CAPS: 100.0000 mg | ORAL_CAPSULE | Freq: Two times a day (BID) | ORAL | Status: AC

## 2015-11-07 ENCOUNTER — Telehealth: Payer: Self-pay | Admitting: Family Medicine

## 2015-11-07 ENCOUNTER — Ambulatory Visit (INDEPENDENT_AMBULATORY_CARE_PROVIDER_SITE_OTHER): Payer: Medicaid Other | Admitting: Family Medicine

## 2015-11-07 ENCOUNTER — Encounter: Payer: Self-pay | Admitting: Family Medicine

## 2015-11-07 VITALS — BP 122/76 | HR 76 | Temp 98.6°F | Resp 16 | Ht 63.0 in | Wt 215.9 lb

## 2015-11-07 DIAGNOSIS — R300 Dysuria: Secondary | ICD-10-CM

## 2015-11-07 DIAGNOSIS — K5901 Slow transit constipation: Secondary | ICD-10-CM

## 2015-11-07 DIAGNOSIS — K5900 Constipation, unspecified: Secondary | ICD-10-CM | POA: Insufficient documentation

## 2015-11-07 DIAGNOSIS — T402X5A Adverse effect of other opioids, initial encounter: Secondary | ICD-10-CM | POA: Insufficient documentation

## 2015-11-07 MED ORDER — TRIAMTERENE-HCTZ 37.5-25 MG PO TABS: 1.0000 | ORAL_TABLET | Freq: Every day | ORAL | Status: DC

## 2015-11-07 MED ORDER — POLYETHYLENE GLYCOL 3350 17 GM/SCOOP PO POWD: 17.0000 g | Freq: Every day | ORAL | Status: DC

## 2015-11-07 MED ORDER — NITROFURANTOIN MONOHYD MACRO 100 MG PO CAPS: 100.0000 mg | ORAL_CAPSULE | Freq: Two times a day (BID) | ORAL | Status: AC

## 2015-11-07 NOTE — Progress Notes (Signed)
BP 122/76   Pulse 76   Temp 98.6 F (37 C) (Oral)   Resp 16   Ht 5\' 3"  (1.6 m)   Wt 215 lb 14.4 oz (97.9 kg)   LMP 11/08/2014   SpO2 97%   BMI 38.24 kg/m    MD note: LMP was in June 2017  Subjective:    Patient ID: Catherine Berg, female    DOB: 06/08/1980, 35 y.o.   MRN: [id number]  HPI: Catherine Berg is a 35 y.o. female  Chief Complaint  Patient presents with  . Abdominal Pain    for 1 week   She has been having lower abdominal pain Going on for a week; she noticed some pressure from sitting down; whole underside She has been constipated, not having normal BMs every day Dr. Aliene AltesAbisogun (endocrinologist) told her that some things will change as her thyroid condition is treated Used to use bathroom every day and now can several  No blood in the stool She has tried a laxative twice in the last two months ago Some dysuria No abnormal vaginal bleeding, some clear discharge Periods have been irregular; endo started her on OCPs; periods have been more painful and longer with clots; something has changed She wanted her to watch the thyroid Taking 100 mg PTU twice a day She needs TSH and free T4 checked 4 weeks after last  Relevant past medical, surgical, family and social history reviewed Past Medical History:  Diagnosis Date  . Abnormal thyroid blood test   . Anxiety   . 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 Graves disease   . History of abnormal cervical Pap smear   . History of cervical polypectomy   . Hives 09/02/2015  . Hypertension   . IFG (impaired fasting glucose)   . Low serum vitamin D   . Obesity   . Pregnancy induced hypertension    with 1st pregnancy, normal pressure with 2nd  . Reflux   . Thyroid disease   . Vitamin B12 deficiency   . Vitamin D deficiency disease    Past Surgical History:  Procedure Laterality Date  . CERVICAL POLYPECTOMY    . CESAREAN SECTION     x 2  . COLON SURGERY     Family History  Problem  Relation Age of Onset  . Heart attack Mother [id number]  . Hypertension Mother   . Asthma Mother   . Cancer Mother     laryngeal  . Diabetes Mother     pre diabetic  . Heart disease Mother   . Mental illness Mother   . Alcohol abuse Mother   . Drug abuse Mother   . Depression Mother   . Anxiety disorder Mother   . Bipolar disorder Mother   . Learning disabilities Brother   . ADD / ADHD Brother   . Asthma Son   . Heart disease Maternal Grandmother     heart attack, pacemaker  . Heart attack Maternal Grandmother   . Hypertension Maternal Grandmother   . Hypertension Maternal Grandfather   . Heart disease Paternal Grandmother     couple of major open heart surgeries, leaking valves  . Hypertension Paternal Grandmother   . Hypertension Paternal Grandfather   . Hyperlipidemia Brother   . Alcohol abuse Father    Social History  Substance Use Topics  . Smoking status: Never Smoker  . Smokeless tobacco: Never Used  . Alcohol use No    Interim medical history  since last visit reviewed. Allergies and medications reviewed  Review of Systems Per HPI unless specifically indicated above     Objective:    BP 122/76   Pulse 76   Temp 98.6 F (37 C) (Oral)   Resp 16   Ht 5\' 3"  (1.6 m)   Wt 215 lb 14.4 oz (97.9 kg)   LMP 11/08/2014   SpO2 97%   BMI 38.24 kg/m     Physical Exam  Constitutional: She appears well-developed and well-nourished. No distress.  Eyes: EOM are normal. No scleral icterus.  Neck: No thyromegaly present.  Cardiovascular: Normal rate.   Pulmonary/Chest: Effort normal.  Abdominal: She exhibits no distension.  Skin: No pallor.  Psychiatric: She has a normal mood and affect. Her behavior is normal. Judgment and thought content normal.    Results for orders placed or performed in visit on 11/07/15  Urinalysis w microscopic + reflex cultur  Result Value Ref Range   Color, Urine DARK YELLOW YELLOW   APPearance CLEAR CLEAR   Specific Gravity, Urine 1.019  1.001 - 1.035   pH 7.0 5.0 - 8.0   Glucose, UA NEGATIVE NEGATIVE   Bilirubin Urine NEGATIVE NEGATIVE   Ketones, ur 1+ (A) NEGATIVE   Hgb urine dipstick NEGATIVE NEGATIVE   Protein, ur NEGATIVE NEGATIVE   Nitrite NEGATIVE NEGATIVE   Leukocytes, UA NEGATIVE NEGATIVE   WBC, UA NONE SEEN <=5 WBC/HPF   RBC / HPF 0-2 <=2 RBC/HPF   Squamous Epithelial / LPF 0-5 <=5 HPF   Bacteria, UA NONE SEEN NONE SEEN HPF   Crystals NONE SEEN NONE SEEN HPF   Casts NONE SEEN NONE SEEN LPF   Yeast NONE SEEN NONE SEEN HPF      Assessment & Plan:   Problem List Items Addressed This Visit      Digestive   Constipation    Suspect constipation is the cause of her symptoms; follow thyroid; water and fiber; miralax; AAS if not improving       Other Visit Diagnoses    Dysuria    -  Primary   Relevant Orders   Urinalysis w microscopic + reflex cultur (Completed)      Follow up plan: Return in about 3 weeks (around 11/26/2015) for labs and visit.  An after-visit summary was printed and given to the patient at check-out.  Please see the patient instructions which may contain other information and recommendations beyond what is mentioned above in the assessment and plan.  Meds ordered this encounter  Medications  . DISCONTD: Norethindrone Acetate-Ethinyl Estradiol (JUNEL,LOESTRIN,MICROGESTIN) 1.5-30 MG-MCG tablet    Sig: Take by mouth.  . polyethylene glycol powder (GLYCOLAX/MIRALAX) powder    Sig: Take 17 g by mouth daily. Mix in water; may cut dose in half if desired    Dispense:  510 g    Refill:  3  . triamterene-hydrochlorothiazide (MAXZIDE-25) 37.5-25 MG tablet    Sig: Take 1 tablet by mouth daily.    Dispense:  30 tablet    Refill:  5    Stop amlodipine  . nitrofurantoin, macrocrystal-monohydrate, (MACROBID) 100 MG capsule    Sig: Take 1 capsule (100 mg total) by mouth 2 (two) times daily.    Dispense:  6 capsule    Refill:  0    Orders Placed This Encounter  Procedures  . Urinalysis w  microscopic + reflex cultur

## 2015-11-07 NOTE — Patient Instructions (Addendum)
Check TSH and free T4 on July 25th and then again 6 weeks later Let's check a urine today Start the miralax daily Work up to 25 grams of fiber a day Try to drink 8 glasses of water a day = 64 ounces a day Stop the amlodipine Start the new blood pressure medicine

## 2015-11-07 NOTE — Telephone Encounter (Signed)
Patient is complaining of lower abnormal pain for the last week.  Patient is scheduled to see Dr. Sherie DonLada on 11/11/15 but needed to know what to do in the meantime.  Please disregard this message patient has been scheduled to come in at 1:40pm today due to a schedule cancellation.

## 2015-11-08 LAB — URINALYSIS W MICROSCOPIC + REFLEX CULTURE
Bacteria, UA: NONE SEEN [HPF]
Bilirubin Urine: NEGATIVE
Casts: NONE SEEN [LPF]
Crystals: NONE SEEN [HPF]
Glucose, UA: NEGATIVE
Hgb urine dipstick: NEGATIVE
Leukocytes, UA: NEGATIVE
Nitrite: NEGATIVE
Protein, ur: NEGATIVE
Specific Gravity, Urine: 1.019 (ref 1.001–1.035)
WBC, UA: NONE SEEN WBC/HPF (ref <=5)
Yeast: NONE SEEN [HPF]
pH: 7 (ref 5.0–8.0)

## 2015-11-11 ENCOUNTER — Ambulatory Visit: Payer: Self-pay | Admitting: Family Medicine

## 2015-11-15 ENCOUNTER — Encounter: Payer: Self-pay | Admitting: Family Medicine

## 2015-11-15 ENCOUNTER — Ambulatory Visit (INDEPENDENT_AMBULATORY_CARE_PROVIDER_SITE_OTHER): Payer: Medicaid Other | Admitting: Family Medicine

## 2015-11-15 ENCOUNTER — Telehealth: Payer: Self-pay | Admitting: Family Medicine

## 2015-11-15 VITALS — BP 116/80 | HR 94 | Temp 99.0°F | Resp 14 | Wt 210.0 lb

## 2015-11-15 DIAGNOSIS — N62 Hypertrophy of breast: Secondary | ICD-10-CM

## 2015-11-15 DIAGNOSIS — R300 Dysuria: Secondary | ICD-10-CM | POA: Diagnosis not present

## 2015-11-15 DIAGNOSIS — N898 Other specified noninflammatory disorders of vagina: Secondary | ICD-10-CM | POA: Diagnosis not present

## 2015-11-15 MED ORDER — METRONIDAZOLE 500 MG PO TABS: 500.0000 mg | ORAL_TABLET | Freq: Two times a day (BID) | ORAL | Status: DC

## 2015-11-15 NOTE — Progress Notes (Signed)
BP 116/80 mmHg  Pulse 94  Temp(Src) 99 F (37.2 C) (Oral)  Resp 14  Wt 210 lb (95.255 kg)  SpO2 99%  LMP 11/08/2014   Subjective:    Patient ID: Catherine Berg, female    DOB: 04/02/1981, 35 y.o.   MRN: [id number]  HPI: Catherine Berg is a 35 y.o. female  Chief Complaint  Patient presents with  . Vaginitis    burning sensation   Symptoms going on for one week, getting progressively worse She is having some burning when using the restroom; lingers through the day; thinks change of pH balance No new sex partners; no change laundry products No real discharge No itching No strong smell urine No fevers No lower abd pain and no lower back pain   She likes the new blood pressure medicine  She has lost 20 pounds and is walking regularly, working hard on weight loss; she was told that if she lost 30 pounds, the plastic surgeon would do her reduction mammoplasty; she went back for f/u and had lost 20 of the 30 pounds and he told her that she could come back in 4 months for next visit; no surgery planned; she continues to have symptoms (see previous notes about her symptoms, grooves in shoulders, breast heaviness, upper back pain, etc.)  Relevant past medical, surgical, family and social history reviewed Past Medical History  Diagnosis Date  . Anxiety   . Low serum vitamin D   . Hypertension   . Reflux   . Obesity   . Vitamin D deficiency disease   . Vitamin B12 deficiency   . IFG (impaired fasting glucose)   . Abnormal thyroid blood test   . 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 History of abnormal cervical Pap smear   . History of cervical polypectomy   . Pregnancy induced hypertension     with 1st pregnancy, normal pressure with 2nd  . Thyroid disease   . Graves disease   . Hives 09/02/2015   Past Surgical History  Procedure Laterality Date  . Cervical polypectomy    . Cesarean section      x 2  . Colon surgery     Social History    Substance Use Topics  . Smoking status: Never Smoker   . Smokeless tobacco: Never Used  . Alcohol Use: No   Interim medical history since last visit reviewed. Allergies and medications reviewed  Review of Systems Per HPI unless specifically indicated above     Objective:    BP 116/80 mmHg  Pulse 94  Temp(Src) 99 F (37.2 C) (Oral)  Resp 14  Wt 210 lb (95.255 kg)  SpO2 99%  LMP 11/08/2014  Wt Readings from Last 3 Encounters:  11/15/15 210 lb (95.255 kg)  11/07/15 215 lb 14.4 oz (97.932 kg)  10/02/15 221 lb 9.6 oz (100.517 kg)    Physical Exam  Constitutional: She appears well-developed and well-nourished.  Cardiovascular: Normal rate.   Pulmonary/Chest: Effort normal.  Genitourinary: There is no rash on the right labia. There is no rash on the left labia. Uterus is not tender. Cervix exhibits no motion tenderness, no discharge and no friability. Right adnexum displays no mass, no tenderness and no fullness. Left adnexum displays no mass, no tenderness and no fullness. There is erythema (mild erythema at introitus) in the vagina. Vaginal discharge (fishy smelling thin watery discharge) found.  Musculoskeletal: She exhibits no edema.  Psychiatric: Her mood appears not anxious.  She does not exhibit a depressed mood.   Results for orders placed or performed in visit on 11/07/15  Urinalysis w microscopic + reflex cultur  Result Value Ref Range   Color, Urine DARK YELLOW YELLOW   APPearance CLEAR CLEAR   Specific Gravity, Urine 1.019 1.001 - 1.035   pH 7.0 5.0 - 8.0   Glucose, UA NEGATIVE NEGATIVE   Bilirubin Urine NEGATIVE NEGATIVE   Ketones, ur 1+ (A) NEGATIVE   Hgb urine dipstick NEGATIVE NEGATIVE   Protein, ur NEGATIVE NEGATIVE   Nitrite NEGATIVE NEGATIVE   Leukocytes, UA NEGATIVE NEGATIVE   WBC, UA NONE SEEN <=5 WBC/HPF   RBC / HPF 0-2 <=2 RBC/HPF   Squamous Epithelial / LPF 0-5 <=5 HPF   Bacteria, UA NONE SEEN NONE SEEN HPF   Crystals NONE SEEN NONE SEEN HPF    Casts NONE SEEN NONE SEEN LPF   Yeast NONE SEEN NONE SEEN HPF      Assessment & Plan:   Problem List Items Addressed This Visit      Other   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    Refer to plastic surgeon for consideration of reduction mammoplasty      Relevant Orders   Ambulatory referral to General Surgery    Other Visit Diagnoses    Dysuria    -  Primary    with irritation at vaginal introitus; I'm more suspicious of BV and irritation than actual cystitis; urine for UA, micro, culture collected and pending    Relevant Orders    Urinalysis w microscopic + reflex cultur    Vaginal discharge        suspect BV; start metronidazole; wet prep collected, pending    Relevant Orders    WET PREP FOR TRICH, YEAST, CLUE        Follow up plan: Return if symptoms worsen or fail to improve.  An after-visit summary was printed and given to the patient at check-out.  Please see the patient instructions which may contain other information and recommendations beyond what is mentioned above in the assessment and plan.  Meds ordered this encounter  Medications  . metroNIDAZOLE (FLAGYL) 500 MG tablet    Sig: Take 1 tablet (500 mg total) by mouth 2 (two) times daily. No alcohol or cold medicines that contain alcohol while on this med    Dispense:  14 tablet    Refill:  0    Orders Placed This Encounter  Procedures  . WET PREP FOR TRICH, YEAST, CLUE  . Urinalysis w microscopic + reflex cultur  . Ambulatory referral to General Surgery

## 2015-11-15 NOTE — Telephone Encounter (Signed)
We discussed this at her visit I left detailed message that I know she doesn't want to see Dr. Shon Houghruesdale and that I put in a referral for her to see another doctor; I'm not sure what happened between our visit and her phone call (I couldn't reach patient, and none of the staff are here to ask about the referral) I'll pass along this message to make sure staff if referring her to another doctor, NOT Dr. Shon Houghruesdale

## 2015-11-15 NOTE — Assessment & Plan Note (Signed)
Refer to plastic surgeon for consideration of reduction mammoplasty

## 2015-11-15 NOTE — Telephone Encounter (Signed)
Patient was seen today and was told that you would refer her for a breast reduction. She is [facility], she feels as though he does not want to do it because of her insurance that he keep pushing everything back. Please return call to discuss.

## 2015-11-15 NOTE — Patient Instructions (Signed)
I'll send in a prescription for you We'll see what the tests show I'll refer you to another surgeon I am so proud of your efforts to lose weight and lead a healthy lifestyle!!

## 2015-11-16 LAB — URINALYSIS W MICROSCOPIC + REFLEX CULTURE
Bacteria, UA: NONE SEEN [HPF]
Bilirubin Urine: NEGATIVE
Casts: NONE SEEN [LPF]
Crystals: NONE SEEN [HPF]
Glucose, UA: NEGATIVE
Hgb urine dipstick: NEGATIVE
Nitrite: NEGATIVE
Protein, ur: NEGATIVE
RBC / HPF: NONE SEEN RBC/HPF (ref <=2)
Specific Gravity, Urine: 1.032 (ref 1.001–1.035)
Yeast: NONE SEEN [HPF]
pH: 5.5 (ref 5.0–8.0)

## 2015-11-17 LAB — URINE CULTURE: Colony Count: 40000

## 2015-11-20 ENCOUNTER — Ambulatory Visit: Payer: Medicaid Other | Admitting: Dietician

## 2015-11-20 LAB — WET PREP BY MOLECULAR PROBE
Candida species: NEGATIVE
Gardnerella vaginalis: POSITIVE — AB
Trichomonas vaginosis: NEGATIVE

## 2015-11-22 ENCOUNTER — Encounter: Payer: Self-pay | Admitting: Family Medicine

## 2015-11-22 MED ORDER — CLINDAMYCIN PHOSPHATE 2 % VA CREA: 1.0000 | TOPICAL_CREAM | Freq: Every day | VAGINAL | Status: AC

## 2015-11-25 ENCOUNTER — Encounter: Payer: Self-pay | Admitting: Family Medicine

## 2015-11-26 ENCOUNTER — Ambulatory Visit: Payer: Medicaid Other | Admitting: Dietician

## 2015-12-05 ENCOUNTER — Encounter: Payer: Self-pay | Admitting: Family Medicine

## 2015-12-05 ENCOUNTER — Telehealth: Payer: Self-pay | Admitting: Family Medicine

## 2015-12-09 ENCOUNTER — Encounter: Payer: Self-pay | Admitting: Dietician

## 2015-12-09 NOTE — Progress Notes (Signed)
Have not heard from patient after missing  2 consecutive appointments; sent discharge letter to MD.

## 2015-12-12 NOTE — Telephone Encounter (Signed)
I'll be glad to write the letter; she can pick it up or it can be mailed

## 2015-12-19 ENCOUNTER — Telehealth: Payer: Self-pay | Admitting: Family Medicine

## 2015-12-19 DIAGNOSIS — R5383 Other fatigue: Secondary | ICD-10-CM

## 2015-12-19 DIAGNOSIS — E559 Vitamin D deficiency, unspecified: Secondary | ICD-10-CM

## 2015-12-19 DIAGNOSIS — E538 Deficiency of other specified B group vitamins: Secondary | ICD-10-CM

## 2015-12-19 DIAGNOSIS — E0500 Thyrotoxicosis with diffuse goiter without thyrotoxic crisis or storm: Secondary | ICD-10-CM

## 2015-12-20 NOTE — Telephone Encounter (Signed)
done

## 2015-12-22 DIAGNOSIS — R5383 Other fatigue: Secondary | ICD-10-CM | POA: Insufficient documentation

## 2015-12-22 NOTE — Assessment & Plan Note (Signed)
Check vit D 

## 2015-12-22 NOTE — Telephone Encounter (Signed)
Note was closed when I got it Asher MuirJamie, please call pt and let her know we'll check several labs: thyroid, vit D, vit B12

## 2015-12-22 NOTE — Addendum Note (Signed)
Addended by: LADA, Janit BernMELINDA P on: 12/22/2015 06:24 PM   Modules accepted: Orders

## 2015-12-22 NOTE — Assessment & Plan Note (Signed)
Check labs 

## 2015-12-22 NOTE — Assessment & Plan Note (Signed)
Check lab

## 2015-12-22 NOTE — Assessment & Plan Note (Signed)
Check labs in endo's absence

## 2015-12-23 MED ORDER — NORETHINDRONE ACET-ETHINYL EST 1.5-30 MG-MCG PO TABS: 1.0000 | ORAL_TABLET | Freq: Every day | ORAL | 5 refills | Status: DC

## 2015-12-23 NOTE — Telephone Encounter (Signed)
Pt.notified

## 2016-01-02 ENCOUNTER — Ambulatory Visit: Payer: Medicaid Other | Admitting: Family Medicine

## 2016-01-08 ENCOUNTER — Ambulatory Visit (INDEPENDENT_AMBULATORY_CARE_PROVIDER_SITE_OTHER): Payer: Medicaid Other | Admitting: Family Medicine

## 2016-01-08 ENCOUNTER — Other Ambulatory Visit: Payer: Self-pay | Admitting: Family Medicine

## 2016-01-08 ENCOUNTER — Encounter: Payer: Self-pay | Admitting: Family Medicine

## 2016-01-08 VITALS — BP 132/82 | HR 64 | Temp 98.3°F | Resp 14 | Wt 204.2 lb

## 2016-01-08 DIAGNOSIS — R198 Other specified symptoms and signs involving the digestive system and abdomen: Secondary | ICD-10-CM | POA: Diagnosis not present

## 2016-01-08 DIAGNOSIS — R5383 Other fatigue: Secondary | ICD-10-CM | POA: Diagnosis not present

## 2016-01-08 DIAGNOSIS — E0500 Thyrotoxicosis with diffuse goiter without thyrotoxic crisis or storm: Secondary | ICD-10-CM

## 2016-01-08 DIAGNOSIS — E0590 Thyrotoxicosis, unspecified without thyrotoxic crisis or storm: Secondary | ICD-10-CM | POA: Diagnosis not present

## 2016-01-08 DIAGNOSIS — E559 Vitamin D deficiency, unspecified: Secondary | ICD-10-CM

## 2016-01-08 DIAGNOSIS — E538 Deficiency of other specified B group vitamins: Secondary | ICD-10-CM

## 2016-01-08 DIAGNOSIS — N898 Other specified noninflammatory disorders of vagina: Secondary | ICD-10-CM | POA: Diagnosis not present

## 2016-01-08 DIAGNOSIS — Z113 Encounter for screening for infections with a predominantly sexual mode of transmission: Secondary | ICD-10-CM | POA: Diagnosis not present

## 2016-01-08 DIAGNOSIS — R3989 Other symptoms and signs involving the genitourinary system: Secondary | ICD-10-CM

## 2016-01-08 LAB — COMPREHENSIVE METABOLIC PANEL
ALT: 25 U/L (ref 6–29)
AST: 27 U/L (ref 10–30)
Albumin: 4.3 g/dL (ref 3.6–5.1)
Alkaline Phosphatase: 71 U/L (ref 33–115)
BUN: 11 mg/dL (ref 7–25)
CO2: 25 mmol/L (ref 20–31)
Calcium: 9.8 mg/dL (ref 8.6–10.2)
Chloride: 98 mmol/L (ref 98–110)
Creat: 0.92 mg/dL (ref 0.50–1.10)
Glucose, Bld: 86 mg/dL (ref 65–99)
Potassium: 3.7 mmol/L (ref 3.5–5.3)
Sodium: 134 mmol/L — ABNORMAL LOW (ref 135–146)
Total Bilirubin: 0.8 mg/dL (ref 0.2–1.2)
Total Protein: 8.3 g/dL — ABNORMAL HIGH (ref 6.1–8.1)

## 2016-01-08 NOTE — Assessment & Plan Note (Signed)
Check TSH 

## 2016-01-08 NOTE — Assessment & Plan Note (Signed)
check

## 2016-01-08 NOTE — Patient Instructions (Signed)
We'll contact you tomorrow about the results Try using astroglide or KY Jelly or other lubricant prior to and during intercourse Do not douche If you have not heard anything from my staff in a week about any orders/referrals/studies from today, please contact us here to follow-up [phone] Keep up the good work with your healthy lifestyle habits!

## 2016-01-08 NOTE — Progress Notes (Signed)
BP 132/82   Pulse 64   Temp 98.3 F (36.8 C) (Oral)   Resp 14   Wt 204 lb 3.2 oz (92.6 kg)   LMP 12/29/2015   SpO2 98%   BMI 36.17 kg/m    Subjective:    Patient ID: Catherine Berg, female    DOB: 02-07-1981, 35 y.o.   MRN: [id number]  HPI: Catherine Berg is a 35 y.o. female  Chief Complaint  Patient presents with  . Vaginal Discharge    , white discharge, itching discomfort, burning   She just felt real irritated the other weak; burned with urination; bumps down there; not sure if just worrying; shaved differently Having some white discharge, sometimes clear; no itching; no fishy odor Urine does smell strong sometimes; changes color; left flank pain; no fevers She gets a sore sometimes right at the introitus, same place; no pain with entry; happens if she hasn't had intercourse for a while and then is sexually active; monogamous  She is due for thyroid recheck  She is working on weight loss; she is walking 2 miles and trying to eat better  Depression screen [facility] 2/9 01/08/2016 11/07/2015 09/04/2015 08/15/2015 06/28/2015  Decreased Interest 0 0 0 0 1  Down, Depressed, Hopeless 1 0 0 0 0  PHQ - 2 Score 1 0 0 0 1  Altered sleeping - - - - -  Tired, decreased energy - - - - -  Change in appetite - - - - -  Feeling bad or failure about yourself  - - - - -  Trouble concentrating - - - - -  Moving slowly or fidgety/restless - - - - -  Suicidal thoughts - - - - -  PHQ-9 Score - - - - -  Difficult doing work/chores - - - - -    Relevant past medical, surgical, family and social history reviewed Past Medical History:  Diagnosis Date  . Abnormal thyroid blood test   . Anxiety   . 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 Graves disease   . History of abnormal cervical Pap smear   . History of cervical polypectomy   . Hives 09/02/2015  . Hypertension   . IFG (impaired fasting glucose)   . Low serum vitamin D   . Obesity   . Pregnancy induced hypertension     with 1st pregnancy, normal pressure with 2nd  . Reflux   . Thyroid disease   . Vitamin B12 deficiency   . Vitamin D deficiency disease    Past Surgical History:  Procedure Laterality Date  . CERVICAL POLYPECTOMY    . CESAREAN SECTION     x 2  . COLON SURGERY     Social History  Substance Use Topics  . Smoking status: Never Smoker  . Smokeless tobacco: Never Used  . Alcohol use No   Interim medical history since last visit reviewed. Allergies and medications reviewed  Review of Systems Per HPI unless specifically indicated above     Objective:    BP 132/82   Pulse 64   Temp 98.3 F (36.8 C) (Oral)   Resp 14   Wt 204 lb 3.2 oz (92.6 kg)   LMP 12/29/2015   SpO2 98%   BMI 36.17 kg/m   Wt Readings from Last 3 Encounters:  01/08/16 204 lb 3.2 oz (92.6 kg)  11/15/15 210 lb (95.3 kg)  11/07/15 215 lb 14.4 oz (97.9 kg)    Physical Exam  Constitutional: She appears well-developed and well-nourished.  Weight loss acknowledged  Cardiovascular: Normal rate.   Pulmonary/Chest: Effort normal.  Abdominal: She exhibits no distension. There is no tenderness.  Genitourinary: There is no rash on the right labia. There is no rash on the left labia. Uterus is not tender. Cervix exhibits no motion tenderness, no discharge and no friability. Right adnexum displays no mass, no tenderness and no fullness. Left adnexum displays no mass, no tenderness and no fullness. There is erythema (mild erythema at introitus with shallow ulceration in the midline) in the vagina. Vaginal discharge (fishy smelling thin watery discharge) found.  Musculoskeletal: She exhibits no edema.  Skin: No rash noted. No pallor.  No vesicles in the groin/labial area  Psychiatric: She has a normal mood and affect. Her mood appears not anxious. She does not exhibit a depressed mood.    Results for orders placed or performed in visit on 11/15/15  Urine culture  Result Value Ref Range   Colony Count 40,000  COLONIES/ML    Organism ID, Bacteria Multiple bacterial morphotypes present, none    Organism ID, Bacteria predominant. Suggest appropriate recollection if     Organism ID, Bacteria clinically indicated.   WET PREP BY MOLECULAR PROBE  Result Value Ref Range   Candida species NEG Negative   Trichomonas vaginosis NEG Negative   Gardnerella vaginalis POS (A) Negative  Urinalysis w microscopic + reflex cultur  Result Value Ref Range   Color, Urine ORANGE (A) YELLOW   APPearance CLEAR CLEAR   Specific Gravity, Urine 1.032 1.001 - 1.035   pH 5.5 5.0 - 8.0   Glucose, UA NEGATIVE NEGATIVE   Bilirubin Urine NEGATIVE NEGATIVE   Ketones, ur 1+ (A) NEGATIVE   Hgb urine dipstick NEGATIVE NEGATIVE   Protein, ur NEGATIVE NEGATIVE   Nitrite NEGATIVE NEGATIVE   Leukocytes, UA 1+ (A) NEGATIVE   WBC, UA 0-5 <=5 WBC/HPF   RBC / HPF NONE SEEN <=2 RBC/HPF   Squamous Epithelial / LPF 6-10 (A) <=5 HPF   Bacteria, UA NONE SEEN NONE SEEN HPF   Crystals NONE SEEN NONE SEEN HPF   Casts NONE SEEN NONE SEEN LPF   Yeast NONE SEEN NONE SEEN HPF      Assessment & Plan:   Problem List Items Addressed This Visit      Digestive   Vitamin B12 deficiency     Endocrine   Hyperthyroidism    Check TSH      Graves' disease     Genitourinary   Genital sore    Check HSV culture; use lubricant        Other   Vitamin D deficiency disease   Vaginal discharge - Primary    Check everything      Relevant Orders   Wet Prep for Trick, Yeast, Clue   Viral culture   Urinalysis w microscopic + reflex cultur   Screening examination for STD (sexually transmitted disease)    check      Relevant Orders   GC/Chlamydia Probe Amp   HIV antibody   RPR   Hepatitis panel, acute   Fatigue    Other Visit Diagnoses   None.     Follow up plan: Return if symptoms worsen or fail to improve.  An after-visit summary was printed and given to the patient at check-out.  Please see the patient instructions which  may contain other information and recommendations beyond what is mentioned above in the assessment and plan.  Meds ordered this  encounter  Medications  . Cholecalciferol (VITAMIN D3) 5000 units TABS    Sig: Take by mouth.    Orders Placed This Encounter  Procedures  . GC/Chlamydia Probe Amp  . Wet Prep for [employer]rick, Yeast, Clue  . Viral culture  . HIV antibody  . RPR  . Hepatitis panel, acute  . Urinalysis w microscopic + reflex cultur

## 2016-01-08 NOTE — Assessment & Plan Note (Signed)
Check HSV culture; use lubricant

## 2016-01-08 NOTE — Assessment & Plan Note (Signed)
Check everything

## 2016-01-09 LAB — VITAMIN B12: Vitamin B-12: 375 pg/mL (ref 200–1100)

## 2016-01-09 LAB — T3, FREE: T3, Free: 2.9 pg/mL (ref 2.3–4.2)

## 2016-01-09 LAB — HEPATITIS PANEL, ACUTE
HCV Ab: NEGATIVE
Hep A IgM: NONREACTIVE
Hep B C IgM: NONREACTIVE
Hepatitis B Surface Ag: NEGATIVE

## 2016-01-09 LAB — URINALYSIS W MICROSCOPIC + REFLEX CULTURE
Bacteria, UA: NONE SEEN [HPF]
Bilirubin Urine: NEGATIVE
Casts: NONE SEEN [LPF]
Crystals: NONE SEEN [HPF]
Glucose, UA: NEGATIVE
Hgb urine dipstick: NEGATIVE
Leukocytes, UA: NEGATIVE
Nitrite: NEGATIVE
Protein, ur: NEGATIVE
RBC / HPF: NONE SEEN RBC/HPF (ref <=2)
Specific Gravity, Urine: 1.023 (ref 1.001–1.035)
Squamous Epithelial / LPF: NONE SEEN [HPF] (ref <=5)
WBC, UA: NONE SEEN WBC/HPF (ref <=5)
Yeast: NONE SEEN [HPF]
pH: 6 (ref 5.0–8.0)

## 2016-01-09 LAB — RPR

## 2016-01-09 LAB — WET PREP FOR TRICH, YEAST, CLUE

## 2016-01-09 LAB — GC/CHLAMYDIA PROBE AMP
CT Probe RNA: NOT DETECTED
GC Probe RNA: NOT DETECTED

## 2016-01-09 LAB — VITAMIN D 25 HYDROXY (VIT D DEFICIENCY, FRACTURES): Vit D, 25-Hydroxy: 38 ng/mL (ref 30–100)

## 2016-01-09 LAB — TSH: TSH: 5.48 mIU/L — ABNORMAL HIGH

## 2016-01-09 LAB — HIV ANTIBODY (ROUTINE TESTING W REFLEX): HIV 1&2 Ab, 4th Generation: NONREACTIVE

## 2016-01-09 LAB — T4, FREE: Free T4: 1 ng/dL (ref 0.8–1.8)

## 2016-01-10 ENCOUNTER — Telehealth: Payer: Self-pay | Admitting: Family Medicine

## 2016-01-10 ENCOUNTER — Encounter: Payer: Self-pay | Admitting: Family Medicine

## 2016-01-10 ENCOUNTER — Other Ambulatory Visit: Payer: Self-pay | Admitting: Family Medicine

## 2016-01-10 LAB — WET PREP BY MOLECULAR PROBE
Candida species: POSITIVE — AB
Gardnerella vaginalis: NEGATIVE
Trichomonas vaginosis: NEGATIVE

## 2016-01-10 MED ORDER — FLUCONAZOLE 150 MG PO TABS: 150.0000 mg | ORAL_TABLET | Freq: Once | ORAL | 0 refills | Status: AC

## 2016-01-10 NOTE — Progress Notes (Signed)
rx for yeast infection sent

## 2016-01-10 NOTE — Telephone Encounter (Signed)
I tried to call, but her mailbox is full and will not accept new messages; I sent Mychart note and Rx to pharmacy

## 2016-01-12 NOTE — Assessment & Plan Note (Signed)
Suspect constipation is the cause of her symptoms; follow thyroid; water and fiber; miralax; AAS if not improving

## 2016-01-13 ENCOUNTER — Encounter: Payer: Self-pay | Admitting: Family Medicine

## 2016-01-13 DIAGNOSIS — E0500 Thyrotoxicosis with diffuse goiter without thyrotoxic crisis or storm: Secondary | ICD-10-CM

## 2016-01-15 LAB — RFX HSV/VARICELLA ZOSTER RAPID CULT

## 2016-01-15 LAB — VIRAL CULTURE VIRC

## 2016-01-15 LAB — CYTOMEGALOVIRUS CULTURE

## 2016-01-15 LAB — REFLEX ADENOVIRUS CULTURE

## 2016-01-27 MED ORDER — LEVOTHYROXINE SODIUM 25 MCG PO TABS: 25.0000 ug | ORAL_TABLET | Freq: Every day | ORAL | 1 refills | Status: DC

## 2016-01-27 NOTE — Assessment & Plan Note (Signed)
Off of PTU for months; now high TSH

## 2016-03-03 ENCOUNTER — Encounter: Payer: Self-pay | Admitting: Family Medicine

## 2016-03-03 DIAGNOSIS — N930 Postcoital and contact bleeding: Secondary | ICD-10-CM

## 2016-03-04 DIAGNOSIS — N930 Postcoital and contact bleeding: Secondary | ICD-10-CM | POA: Insufficient documentation

## 2016-03-06 ENCOUNTER — Telehealth: Payer: Self-pay | Admitting: Family Medicine

## 2016-03-06 NOTE — Telephone Encounter (Signed)
Pt called stating she would like a call about to further discuss what you all talked about through My Chart. [phone]

## 2016-03-11 NOTE — Telephone Encounter (Signed)
I am sorry to have been so late getting back to this call; I left detailed msg; will talk to her tomorrow

## 2016-03-12 ENCOUNTER — Ambulatory Visit (INDEPENDENT_AMBULATORY_CARE_PROVIDER_SITE_OTHER): Payer: Medicaid Other | Admitting: Family Medicine

## 2016-03-12 NOTE — Telephone Encounter (Signed)
I reviewed the Mychart message I called her, left detailed msg; I put in the referral to GYN, so if she's calling about that, she is welcome to call here directly and speak to our referral coordinator or Asher MuirJamie I will be out of the office the rest of the week If any further issues or problems, please call back or send Mychart message

## 2016-03-16 ENCOUNTER — Ambulatory Visit: Payer: Medicaid Other | Admitting: Family Medicine

## 2016-04-10 ENCOUNTER — Ambulatory Visit: Payer: Self-pay | Admitting: Family Medicine

## 2016-04-14 ENCOUNTER — Encounter: Payer: Self-pay | Admitting: Family Medicine

## 2016-04-15 MED ORDER — FLUCONAZOLE 150 MG PO TABS: 150.0000 mg | ORAL_TABLET | Freq: Once | ORAL | 0 refills | Status: AC

## 2016-04-22 ENCOUNTER — Other Ambulatory Visit: Payer: Self-pay

## 2016-04-22 ENCOUNTER — Encounter: Payer: Self-pay | Admitting: Family Medicine

## 2016-04-22 ENCOUNTER — Ambulatory Visit (INDEPENDENT_AMBULATORY_CARE_PROVIDER_SITE_OTHER): Payer: Medicaid Other | Admitting: Family Medicine

## 2016-04-22 DIAGNOSIS — E559 Vitamin D deficiency, unspecified: Secondary | ICD-10-CM

## 2016-04-22 DIAGNOSIS — I10 Essential (primary) hypertension: Secondary | ICD-10-CM | POA: Diagnosis not present

## 2016-04-22 DIAGNOSIS — E8809 Other disorders of plasma-protein metabolism, not elsewhere classified: Secondary | ICD-10-CM | POA: Diagnosis not present

## 2016-04-22 DIAGNOSIS — E041 Nontoxic single thyroid nodule: Secondary | ICD-10-CM

## 2016-04-22 DIAGNOSIS — J4520 Mild intermittent asthma, uncomplicated: Secondary | ICD-10-CM | POA: Diagnosis not present

## 2016-04-22 MED ORDER — TRIAMCINOLONE ACETONIDE 0.1 % EX CREA: 1.0000 "application " | TOPICAL_CREAM | Freq: Two times a day (BID) | CUTANEOUS | 0 refills | Status: DC

## 2016-04-22 NOTE — Assessment & Plan Note (Signed)
Continue QVAR

## 2016-04-22 NOTE — Progress Notes (Signed)
BP (!) 112/58   Pulse 73   Temp 98.4 F (36.9 C) (Oral)   Resp 14   Wt 202 lb (91.6 kg)   LMP 03/21/2016   SpO2 99%   BMI 35.78 kg/m    Subjective:    Patient ID: Catherine Berg, female    DOB: 10/09/1980, 35 y.o.   MRN: [id number]  HPI: Catherine Berg is a 35 y.o. female  Chief Complaint  Patient presents with  . Follow-up  . 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 Aliene AltesAbisogun is back so she's managing thyroid now Joint pain when she sits Reviewed labs; elevated protein Vitamin D deficiency; taking supplement  [facility] 2[facility] 04/22/2016 01/08/2016 11/07/2015 09/04/2015 08/15/2015  Decreased Interest 0 0 0 0 0  Down, Depressed, Hopeless 0 1 0 0 0  PHQ - 2 Score 0 1 0 0 0  Altered sleeping - - - - -  Tired, decreased energy - - - - -  Change in appetite - - - - -  Feeling bad or failure about yourself  - - - - -  Trouble concentrating - - - - -  Moving slowly or fidgety/restless - - - - -  Suicidal thoughts - - - - -  PHQ-9 Score - - - - -  Difficult doing work/chores - - - - -    Relevant past medical, surgical, family and social history reviewed Past Medical History:  Diagnosis Date  . Abnormal thyroid blood test   . Anxiety   . 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 Graves disease   . History of abnormal cervical Pap smear   . History of cervical polypectomy   . Hives 09/02/2015  . Hypertension   . IFG (impaired fasting glucose)   . Low serum vitamin D   . Obesity   . Pregnancy induced hypertension    with 1st pregnancy, normal pressure with 2nd  . Reflux   . Thyroid disease   . Vitamin B12  deficiency   . Vitamin D deficiency disease    Past Surgical History:  Procedure Laterality Date  . CERVICAL POLYPECTOMY    . CESAREAN SECTION     x 2  . COLON SURGERY     Family History  Problem Relation Age of Onset  . Heart attack Mother [id number]  . Hypertension Mother   . Asthma Mother   . Cancer Mother     laryngeal  . Diabetes Mother     pre diabetic  . Heart disease Mother   . Mental illness Mother   . Alcohol abuse Mother   . Drug abuse Mother   . Depression Mother   . Anxiety disorder Mother   . Bipolar disorder Mother   . Learning disabilities Brother   . ADD / ADHD Brother   . Asthma Son   . Heart disease Maternal Grandmother     heart attack, pacemaker  . Heart attack Maternal Grandmother   . Hypertension Maternal Grandmother   . Hypertension Maternal Grandfather   . Heart disease Paternal Grandmother     couple of major open heart  surgeries, leaking valves  . Hypertension Paternal Grandmother   . Hypertension Paternal Grandfather   . Hyperlipidemia Brother   . Alcohol abuse Father    Social History  Substance Use Topics  . Smoking status: Never Smoker  . Smokeless tobacco: Never Used  . Alcohol use No    Interim medical history since last visit reviewed. Allergies and medications reviewed  Review of Systems Per HPI unless specifically indicated above     Objective:    BP (!) 112/58   Pulse 73   Temp 98.4 F (36.9 C) (Oral)   Resp 14   Wt 202 lb (91.6 kg)   LMP 03/21/2016   SpO2 99%   BMI 35.78 kg/m   Wt Readings from Last 3 Encounters:  04/22/16 202 lb (91.6 kg)  01/08/16 204 lb 3.2 oz (92.6 kg)  11/15/15 210 lb (95.3 kg)    Physical Exam  Constitutional: She appears well-developed and well-nourished. No distress.  Eyes: EOM are normal. No scleral icterus.  Neck: No thyromegaly present.  Cardiovascular: Normal rate and regular rhythm.   Pulmonary/Chest: Effort normal and breath sounds normal. No respiratory distress. She has no  wheezes.  Abdominal: She exhibits no distension.  Musculoskeletal: She exhibits no edema.  Neurological: She is alert.  Skin: No pallor.  Psychiatric: She has a normal mood and affect. Her behavior is normal. Judgment and thought content normal.      Assessment & Plan:   Problem List Items Addressed This Visit      Cardiovascular and Mediastinum   Essential hypertension, benign    Continue to monitor; DASH guidelines encouraged; see AVS      Relevant Orders   Comprehensive Metabolic Panel (CMET) (Completed)     Respiratory   Allergy-induced asthma, mild intermittent, uncomplicated    Continue QVAR        Endocrine   Thyroid nodule    Managed by endo      Relevant Medications   levothyroxine (SYNTHROID, LEVOTHROID) 50 MCG tablet     Other   Vitamin D deficiency disease    Check vit  D      Relevant Orders   Vitamin D (25 hydroxy) (Completed)   Hyperproteinemia    .      Relevant Orders   Comprehensive Metabolic Panel (CMET) (Completed)      Follow up plan: No Follow-up on file.  An after-visit summary was printed and given to the patient at check-out.  Please see the patient instructions which may contain other information and recommendations beyond what is mentioned above in the assessment and plan.  Meds ordered this encounter  Medications  . levothyroxine (SYNTHROID, LEVOTHROID) 50 MCG tablet    Sig: Take by mouth.  . triamcinolone cream (KENALOG) 0.1 %    Sig: Apply 1 application topically 2 (two) times daily.    Dispense:  30 g    Refill:  0    Orders Placed This Encounter  Procedures  . Vitamin D (25 hydroxy)  . Comprehensive Metabolic Panel (CMET)

## 2016-04-22 NOTE — Patient Instructions (Signed)
Your goal blood pressure is less than 140 mmHg on top. Try to follow the DASH guidelines (DASH stands for Dietary Approaches to Stop Hypertension) Try to limit the sodium in your diet.  Ideally, consume less than 1.5 grams (less than 1,500mg) per day. Do not add salt when cooking or at the table.  Check the sodium amount on labels when shopping, and choose items lower in sodium when given a choice. Avoid or limit foods that already contain a lot of sodium. Eat a diet rich in fruits and vegetables and whole grains.  

## 2016-04-22 NOTE — Assessment & Plan Note (Addendum)
Continue to monitor; DASH guidelines encouraged; see AVS

## 2016-04-22 NOTE — Assessment & Plan Note (Signed)
Check vit D 

## 2016-04-23 ENCOUNTER — Other Ambulatory Visit: Payer: Self-pay | Admitting: Family Medicine

## 2016-04-23 DIAGNOSIS — N289 Disorder of kidney and ureter, unspecified: Secondary | ICD-10-CM | POA: Insufficient documentation

## 2016-04-23 LAB — COMPREHENSIVE METABOLIC PANEL
ALT: 18 IU/L (ref 0–32)
AST: 19 IU/L (ref 0–40)
Albumin/Globulin Ratio: 1.3 (ref 1.2–2.2)
Albumin: 4 g/dL (ref 3.5–5.5)
Alkaline Phosphatase: 63 IU/L (ref 39–117)
BUN/Creatinine Ratio: 11 (ref 9–23)
BUN: 12 mg/dL (ref 6–20)
Bilirubin Total: 0.4 mg/dL (ref 0.0–1.2)
CO2: 24 mmol/L (ref 18–29)
Calcium: 9.2 mg/dL (ref 8.7–10.2)
Chloride: 102 mmol/L (ref 96–106)
Creatinine, Ser: 1.05 mg/dL — ABNORMAL HIGH (ref 0.57–1.00)
GFR calc Af Amer: 79 mL/min/{1.73_m2} (ref >59)
GFR calc non Af Amer: 68 mL/min/{1.73_m2} (ref >59)
Globulin, Total: 3.2 g/dL (ref 1.5–4.5)
Glucose: 81 mg/dL (ref 65–99)
Potassium: 4.4 mmol/L (ref 3.5–5.2)
Sodium: 140 mmol/L (ref 134–144)
Total Protein: 7.2 g/dL (ref 6.0–8.5)

## 2016-04-23 LAB — VITAMIN D 25 HYDROXY (VIT D DEFICIENCY, FRACTURES): Vit D, 25-Hydroxy: 26.2 ng/mL — ABNORMAL LOW (ref 30.0–100.0)

## 2016-04-23 NOTE — Assessment & Plan Note (Signed)
Check bmp in 3 months

## 2016-04-23 NOTE — Progress Notes (Signed)
Bmp for 3 months

## 2016-04-28 ENCOUNTER — Encounter: Payer: Self-pay | Admitting: Family Medicine

## 2016-05-01 ENCOUNTER — Other Ambulatory Visit: Payer: Self-pay | Admitting: Family Medicine

## 2016-05-01 DIAGNOSIS — R32 Unspecified urinary incontinence: Secondary | ICD-10-CM

## 2016-05-01 NOTE — Progress Notes (Signed)
See mychart note; urine order entered

## 2016-05-02 NOTE — Assessment & Plan Note (Signed)
Managed by endo

## 2016-05-08 ENCOUNTER — Other Ambulatory Visit: Payer: Self-pay

## 2016-05-08 DIAGNOSIS — R32 Unspecified urinary incontinence: Secondary | ICD-10-CM

## 2016-05-09 LAB — URINALYSIS W MICROSCOPIC + REFLEX CULTURE
Bacteria, UA: NONE SEEN [HPF]
Bilirubin Urine: NEGATIVE
Casts: NONE SEEN [LPF]
Crystals: NONE SEEN [HPF]
Glucose, UA: NEGATIVE
Hgb urine dipstick: NEGATIVE
Ketones, ur: NEGATIVE
Leukocytes, UA: NEGATIVE
Nitrite: NEGATIVE
Protein, ur: NEGATIVE
Specific Gravity, Urine: 1.018 (ref 1.001–1.035)
WBC, UA: NONE SEEN WBC/HPF (ref <=5)
Yeast: NONE SEEN [HPF]
pH: 6 (ref 5.0–8.0)

## 2016-05-21 ENCOUNTER — Encounter: Payer: Self-pay | Admitting: Obstetrics and Gynecology

## 2016-06-04 ENCOUNTER — Ambulatory Visit (INDEPENDENT_AMBULATORY_CARE_PROVIDER_SITE_OTHER): Payer: Medicaid Other | Admitting: Obstetrics and Gynecology

## 2016-06-04 ENCOUNTER — Encounter: Payer: Self-pay | Admitting: Obstetrics and Gynecology

## 2016-06-04 VITALS — BP 146/89 | HR 81 | Ht 63.0 in | Wt 203.4 lb

## 2016-06-04 DIAGNOSIS — O99281 Endocrine, nutritional and metabolic diseases complicating pregnancy, first trimester: Secondary | ICD-10-CM

## 2016-06-04 DIAGNOSIS — O34219 Maternal care for unspecified type scar from previous cesarean delivery: Secondary | ICD-10-CM

## 2016-06-04 DIAGNOSIS — N926 Irregular menstruation, unspecified: Secondary | ICD-10-CM | POA: Diagnosis not present

## 2016-06-04 DIAGNOSIS — I10 Essential (primary) hypertension: Secondary | ICD-10-CM

## 2016-06-04 DIAGNOSIS — N930 Postcoital and contact bleeding: Secondary | ICD-10-CM

## 2016-06-04 DIAGNOSIS — O09521 Supervision of elderly multigravida, first trimester: Secondary | ICD-10-CM

## 2016-06-04 DIAGNOSIS — O99282 Endocrine, nutritional and metabolic diseases complicating pregnancy, second trimester: Secondary | ICD-10-CM

## 2016-06-04 DIAGNOSIS — O09211 Supervision of pregnancy with history of pre-term labor, first trimester: Secondary | ICD-10-CM

## 2016-06-04 DIAGNOSIS — O09522 Supervision of elderly multigravida, second trimester: Secondary | ICD-10-CM | POA: Insufficient documentation

## 2016-06-04 DIAGNOSIS — E039 Hypothyroidism, unspecified: Secondary | ICD-10-CM | POA: Insufficient documentation

## 2016-06-04 DIAGNOSIS — E6609 Other obesity due to excess calories: Secondary | ICD-10-CM

## 2016-06-04 DIAGNOSIS — O09891 Supervision of other high risk pregnancies, first trimester: Secondary | ICD-10-CM | POA: Insufficient documentation

## 2016-06-04 LAB — POCT URINE PREGNANCY: Preg Test, Ur: POSITIVE — AB

## 2016-06-04 NOTE — Progress Notes (Signed)
Pt is here for bleeding after intercourse X3 with c/o pain after. Pt also states she just found out she is pregnant with LMP of 04/25/16. LPS unsure with PCP.

## 2016-06-04 NOTE — Progress Notes (Signed)
HPI:      [name] is a 36 y.o. [phone] who LMP was Patient's last menstrual period was 04/25/2016 (exact date).  Subjective: She presents today after having a home positive pregnancy test. This is her third pregnancy. She has had 2 prior cesarean deliveries and desires a third. Her first pregnancy was complicated by pregnancy-induced hypertension which resulted in a preterm delivery. Her second pregnancy was also delivered preterm. (Both 35-36 weeks). She has experienced some postcoital bleeding over the last 3 months. This is associated with some pelvic cramping. She has a remote history of hyperthyroidism and thyroid ablation-currently taking thyroxine. She has a history of chronic hypertension but was recently taken off her blood pressure medication because her pressures were too low while taking it.    Hx: The following portions of the patient's history were reviewed and updated as appropriate:           She  has a past medical history of Abnormal thyroid blood test; Anxiety; Elevated serum glutamic pyruvic transaminase (SGPT) level; Graves disease; History of abnormal cervical Pap smear; History of cervical polypectomy; Hives (09/02/2015); Hypertension; IFG (impaired fasting glucose); Low serum vitamin D; Obesity; Pregnancy induced hypertension; Reflux; Thyroid disease; Vitamin B12 deficiency; and Vitamin D deficiency disease. She  does not have any pertinent problems on file. She  has a past surgical history that includes Cervical polypectomy; Cesarean section; and Colon surgery. Her family history includes ADD / ADHD in her brother; Alcohol abuse in her father and mother; Anxiety disorder in her mother; Asthma in her mother and son; Bipolar disorder in her mother; Cancer in her mother; Depression in her mother; Diabetes in her mother; Drug abuse in her mother; Heart attack in her maternal grandmother; Heart attack (age of onset: 4247) in her mother; Heart disease in her maternal  grandmother, mother, and paternal grandmother; Hyperlipidemia in her brother; Hypertension in her maternal grandfather, maternal grandmother, mother, paternal grandfather, and paternal grandmother; Learning disabilities in her brother; Mental illness in her mother. She  reports that she has never smoked. She has never used smokeless tobacco. She reports that she does not drink alcohol or use drugs.        ROS: Constitutional: Denied constitutional symptoms, night sweats, recent illness, fatigue, fever, insomnia and weight loss.  Eyes: Denied eye symptoms, eye pain, photophobia, vision change and visual disturbance.  Ears/Nose/Throat/Neck: Denied ear, nose, throat or neck symptoms, hearing loss, nasal discharge, sinus congestion and sore throat.  Cardiovascular: Denied cardiovascular symptoms, arrhythmia, chest pain/pressure, edema, exercise intolerance, orthopnea and palpitations.  Respiratory: Denied pulmonary symptoms, asthma, pleuritic pain, productive sputum, cough, dyspnea and wheezing.  Gastrointestinal: Denied, gastro-esophageal reflux, melena, nausea and vomiting.  Genitourinary: See HPI for additional information.  Musculoskeletal: Denied musculoskeletal symptoms, stiffness, swelling, muscle weakness and myalgia.  Dermatologic: Denied dermatology symptoms, rash and scar.  Neurologic: Denied neurology symptoms, dizziness, headache, neck pain and syncope.  Psychiatric: Denied psychiatric symptoms, anxiety and depression.  Endocrine: Denied endocrine symptoms including hot flashes and night sweats.   Meds: She has a current medication list which includes the following prescription(s): albuterol, beclomethasone, vitamin d3, epinephrine, escitalopram, hydroxyzine, levothyroxine, naproxen, polyethylene glycol powder, triamcinolone cream, and vitamin b-12.  Objective: Vitals:   06/04/16 1458  BP: (!) 146/89  Pulse: 81            Physical examination General NAD, Conversant  HEENT  Atraumatic; Op clear with mmm.  Normo-cephalic. Pupils reactive. Anicteric sclerae  Thyroid/Neck Smooth without nodularity or enlargement.  Normal ROM.  Neck Supple.  Skin No rashes, lesions or ulceration. Normal palpated skin turgor. No nodularity.  Breasts: No masses or discharge.  Symmetric.  No axillary adenopathy.  Lungs: Clear to auscultation.No rales or wheezes. Normal Respiratory effort, no retractions.  Heart: NSR.  No murmurs or rubs appreciated. No periferal edema  Abdomen: Soft.  Non-tender.  No masses.  No HSM. No hernia  Extremities: Moves all appropriately.  Normal ROM for age. No lymphadenopathy.  Neuro: Oriented to PPT.  Normal mood. Normal affect.     Pelvic:   Vulva: Normal appearance.  No lesions.  Vagina: No lesions or abnormalities noted.  Support: Normal pelvic support.  Urethra No masses tenderness or scarring.  Meatus Normal size without lesions or prolapse.  Cervix: Normal appearance.  No lesions.  Anus: Normal exam.  No lesions.  Perineum: Normal exam.  No lesions.        Bimanual   Adnexae: No masses.  Non-tender to palpation.  Uterus: Enlarged. 5 wks  Non-tender.  Mobile.  AV.  Adnexae: No masses.  Non-tender to palpation.  Cul-de-sac: Negative for abnormality.  Adnexae: No masses.  Non-tender to palpation.         Pelvimetry   Diagonal: Reached.  Spines: Average.  Sacrum: Concave.  Pubic Arch: Normal.     Assessment: 1. Missed menses   2. PCB (post coital bleeding)   3. Obesity due to excess calories with serious comorbidity, unspecified classification   4. Essential hypertension   5. Previous cesarean delivery, delivered   6. History of preterm delivery, currently pregnant in first trimester   7. AMA (advanced maternal age) multigravida 35+, first trimester   8. Hypothyroidism in pregnancy, antepartum, first trimester     Plan:            Prenatal Plan 1.  The patient was given prenatal literature. 2.  She was begun on prenatal  vitamins. 3.  A prenatal lab panel was ordered or drawn. 4.  An ultrasound was ordered to better determine an [facility] and to check her postcoital bleeding. 5.  We have discussed AMA and plan to do a Cell-free DNA test at approximately 12 weeks. 6.  Patient to continue her thyroid medication. She is seen by an endocrinologist to plans to test her during the pregnancy. 7.  Plan for repeat cesarean delivery. Patient considering permanent sterilization. 8.  Follow hypertension.   Orders Orders Placed This Encounter  Procedures  . POCT urine pregnancy    No orders of the defined types were placed in this encounter.       F/U  Return in about 2 weeks (around 06/18/2016).  Elonda Husky, M.D. 06/04/2016 4:24 PM

## 2016-06-05 ENCOUNTER — Encounter: Payer: Self-pay | Admitting: Obstetrics and Gynecology

## 2016-06-08 ENCOUNTER — Telehealth: Payer: Self-pay

## 2016-06-08 ENCOUNTER — Encounter: Payer: Self-pay | Admitting: Family Medicine

## 2016-06-08 ENCOUNTER — Other Ambulatory Visit: Payer: Self-pay | Admitting: Obstetrics and Gynecology

## 2016-06-08 DIAGNOSIS — Z369 Encounter for antenatal screening, unspecified: Secondary | ICD-10-CM

## 2016-06-08 LAB — PAP IG, CT-NG NAA, HPV HIGH-RISK
Chlamydia, Nuc. Acid Amp: NEGATIVE
Gonococcus by Nucleic Acid Amp: NEGATIVE
HPV, high-risk: NEGATIVE
PAP Smear Comment: 0

## 2016-06-08 NOTE — Telephone Encounter (Signed)
-----   Message from Linzie Collinavid James Evans, MD sent at 06/08/2016 10:50 AM EST ----- Negative Pap with Negative HPV Negative GC/CT

## 2016-06-08 NOTE — Telephone Encounter (Signed)
Pt informed of negative test results 

## 2016-06-09 ENCOUNTER — Telehealth: Payer: Self-pay

## 2016-06-09 ENCOUNTER — Encounter: Payer: Self-pay | Admitting: Family Medicine

## 2016-06-09 NOTE — Telephone Encounter (Signed)
Spoke with patient neg. Results given with patient understanding.

## 2016-06-09 NOTE — Telephone Encounter (Signed)
-----   Message from Linzie Collinavid James Evans, MD sent at 06/09/2016  8:24 AM EST ----- Negative Pap and HPV

## 2016-06-10 ENCOUNTER — Encounter: Payer: Self-pay | Admitting: Obstetrics and Gynecology

## 2016-06-16 ENCOUNTER — Ambulatory Visit (INDEPENDENT_AMBULATORY_CARE_PROVIDER_SITE_OTHER): Payer: Medicaid Other | Admitting: Obstetrics and Gynecology

## 2016-06-16 ENCOUNTER — Ambulatory Visit (INDEPENDENT_AMBULATORY_CARE_PROVIDER_SITE_OTHER): Payer: Medicaid Other

## 2016-06-16 ENCOUNTER — Other Ambulatory Visit: Payer: Self-pay | Admitting: Obstetrics and Gynecology

## 2016-06-16 VITALS — BP 126/66 | HR 71 | Ht 63.0 in | Wt 204.6 lb

## 2016-06-16 DIAGNOSIS — Z3481 Encounter for supervision of other normal pregnancy, first trimester: Secondary | ICD-10-CM

## 2016-06-16 DIAGNOSIS — Z369 Encounter for antenatal screening, unspecified: Secondary | ICD-10-CM

## 2016-06-16 DIAGNOSIS — Z3A01 Less than 8 weeks gestation of pregnancy: Secondary | ICD-10-CM

## 2016-06-16 DIAGNOSIS — Z8639 Personal history of other endocrine, nutritional and metabolic disease: Secondary | ICD-10-CM

## 2016-06-16 DIAGNOSIS — O09299 Supervision of pregnancy with other poor reproductive or obstetric history, unspecified trimester: Secondary | ICD-10-CM

## 2016-06-16 DIAGNOSIS — R638 Other symptoms and signs concerning food and fluid intake: Secondary | ICD-10-CM

## 2016-06-16 NOTE — Progress Notes (Signed)
Catherine Berg presents for NOB nurse interview visit. Pregnancy confirmation done 06/04/2016.    G-4 .  P- 2 Pregnancy education material explained and given.  No__ cats in the home. NOB labs ordered. (TSH/HbgA1c due to Increased BMI), (sickle cell). HIV labs and Drug screen were explained optional and she did not decline. Drug screen ordered, PNV encouraged. Genetic screening options discussed will discuss with provider at NOB physical.  Pt. To follow up with provider in _4_ weeks for NOB physical.  All questions answered. Pt has history of preeclampsia last 2 pregnancies, will make appt with one of doctors

## 2016-06-17 ENCOUNTER — Ambulatory Visit (INDEPENDENT_AMBULATORY_CARE_PROVIDER_SITE_OTHER): Payer: Medicaid Other | Admitting: Family Medicine

## 2016-06-17 ENCOUNTER — Telehealth: Payer: Self-pay | Admitting: Family Medicine

## 2016-06-17 ENCOUNTER — Encounter: Payer: Self-pay | Admitting: Family Medicine

## 2016-06-17 VITALS — BP 116/68 | HR 87 | Temp 98.1°F | Resp 16 | Wt 208.0 lb

## 2016-06-17 DIAGNOSIS — R6889 Other general symptoms and signs: Secondary | ICD-10-CM

## 2016-06-17 NOTE — Progress Notes (Signed)
Not seen. She left before she was seen

## 2016-06-17 NOTE — Progress Notes (Signed)
I have reviewed the record and concur with patient management and plan.  Yunuen Mordan, MD [facility]  

## 2016-06-17 NOTE — Telephone Encounter (Signed)
Runny nose, headache, nausea, no fever She did not call her OB I urged her to either go to urgent care or call her OB on-call right now to see if they will prescribe her Tamiflu if appropriate; she will hang up and call OB on-call I explained 48 hour window, best chance for medicine to take effect, don't wait until tomorrow

## 2016-06-17 NOTE — Telephone Encounter (Signed)
Patient came in this afternoon to see Dr. Carlynn PurlSowles due your schedule being booked at the time.  Dr. Carlynn PurlSowles was running behind and patient could not wait any longer to be seen due to another medical appointment. Per patient she came in for the following symptoms: Cough Headache Fever Nausea  Patient stated that she had the above symptoms since Sunday.  I did apologize to the patient for the wait time she experienced and for us not communicating while she was in the exam room that Dr. Carlynn PurlSowles was running behind.  Patient would like some advice as to what she can do.  She can be reached by phone or mychart

## 2016-06-18 ENCOUNTER — Encounter: Payer: Self-pay | Admitting: Obstetrics and Gynecology

## 2016-06-18 ENCOUNTER — Ambulatory Visit (INDEPENDENT_AMBULATORY_CARE_PROVIDER_SITE_OTHER): Payer: Medicaid Other | Admitting: Obstetrics and Gynecology

## 2016-06-18 VITALS — BP 118/73 | HR 80 | Temp 98.8°F | Wt 205.3 lb

## 2016-06-18 DIAGNOSIS — B9789 Other viral agents as the cause of diseases classified elsewhere: Secondary | ICD-10-CM

## 2016-06-18 DIAGNOSIS — J069 Acute upper respiratory infection, unspecified: Secondary | ICD-10-CM

## 2016-06-18 LAB — POCT URINALYSIS DIPSTICK
Bilirubin, UA: NEGATIVE
Blood, UA: NEGATIVE
Glucose, UA: NEGATIVE
Ketones, UA: NEGATIVE
Leukocytes, UA: NEGATIVE
Nitrite, UA: NEGATIVE
Protein, UA: NEGATIVE
Spec Grav, UA: 1.01
Urobilinogen, UA: NEGATIVE
pH, UA: 5

## 2016-06-18 NOTE — Progress Notes (Signed)
HPI:      [name] is a 36 y.o. [phone] who LMP was Patient's last menstrual period was 04/25/2016 (exact date).  Subjective: She presents today at approximate 7 weeks estimated gestational age complaining of cough sore throat and nasal congestion. She is concerned she may have the flu. If she has that she does not want to give it to her children.    Hx: The following portions of the patient's history were reviewed and updated as appropriate:            She  has a past medical history of Abnormal thyroid blood test; Anxiety; Elevated serum glutamic pyruvic transaminase (SGPT) level; Graves disease; History of abnormal cervical Pap smear; History of cervical polypectomy; Hives (09/02/2015); Hypertension; IFG (impaired fasting glucose); Low serum vitamin D; Obesity; Pregnancy induced hypertension; Reflux; Thyroid disease; Vitamin B12 deficiency; and Vitamin D deficiency disease. She  does not have any pertinent problems on file. She  has a past surgical history that includes Cervical polypectomy; Cesarean section; and Colon surgery. Current Outpatient Prescriptions on File Prior to Visit  Medication Sig Dispense Refill  . albuterol (PROVENTIL HFA;VENTOLIN HFA) 108 (90 Base) MCG/ACT inhaler Inhale 2 puffs into the lungs every 4 (four) hours as needed for wheezing or shortness of breath. 1 Inhaler 0  . beclomethasone (QVAR) 80 MCG/ACT inhaler Inhale 2 puffs into the lungs 2 (two) times daily. 1 Inhaler 5  . Cholecalciferol (VITAMIN D3) 5000 units TABS Take by mouth.    . EPINEPHrine 0.3 mg/0.3 mL IJ SOAJ injection Inject 0.3 mLs (0.3 mg total) into the muscle once. 1 Device 1  . escitalopram (LEXAPRO) 10 MG tablet Take 1.5 tablets (15 mg total) by mouth daily. 45 tablet 1  . hydrOXYzine (VISTARIL) 25 MG capsule Take 1 capsule (25 mg total) by mouth every 6 (six) hours as needed. 60 capsule 0  . levothyroxine (SYNTHROID, LEVOTHROID) 50 MCG tablet Take by mouth.    . naproxen (NAPROSYN) 375  MG tablet Take 1 tablet (375 mg total) by mouth 2 (two) times daily with a meal. No additional NSAIDs 60 tablet 0  . polyethylene glycol powder (GLYCOLAX/MIRALAX) powder Take 17 g by mouth daily. Mix in water; may cut dose in half if desired 510 g 3  . triamcinolone cream (KENALOG) 0.1 % Apply 1 application topically 2 (two) times daily. 30 g 0  . vitamin B-12 (CYANOCOBALAMIN) 500 MCG tablet Take 500 mcg by mouth daily.     No current facility-administered medications on file prior to visit.           ROS: Constitutional: Denied constitutional symptoms, night sweats, recent illness, fatigue, fever, insomnia and weight loss.  Eyes: Denied eye symptoms, eye pain, photophobia, vision change and visual disturbance.  Ears/Nose/Throat/Neck: See HPI for additional information.  Cardiovascular: Denied cardiovascular symptoms, arrhythmia, chest pain/pressure, edema, exercise intolerance, orthopnea and palpitations.  Respiratory: See HPI for additional information.  Gastrointestinal: Denied, gastro-esophageal reflux, melena, nausea and vomiting.  Genitourinary: Denied genitourinary symptoms including symptomatic vaginal discharge, pelvic relaxation issues, and urinary complaints.  Musculoskeletal: Denied musculoskeletal symptoms, stiffness, swelling, muscle weakness and myalgia.  Dermatologic: Denied dermatology symptoms, rash and scar.  Neurologic: Denied neurology symptoms, dizziness, headache, neck pain and syncope.  Psychiatric: Denied psychiatric symptoms, anxiety and depression.  Endocrine: Denied endocrine symptoms including hot flashes and night sweats.   Meds: She has a current medication list which includes the following prescription(s): albuterol, beclomethasone, vitamin d3, epinephrine, escitalopram, hydroxyzine, levothyroxine, naproxen, polyethylene glycol  powder, triamcinolone cream, and vitamin b-12.  Objective: Vitals:   06/18/16 1431  BP: 118/73  Pulse: 80  Temp: 98.8 F (37.1  C)            Assessment: 1. Viral upper respiratory tract infection      Plan:             1.  Unable to do rapid flu testing because this practice and neighboring practices are out of the flu test kits which are all on back order. We have discussed URI versus flu symptoms in detail. Symptomatic relief of URI discussed especially in light of first trimester pregnancy. Medications considered safe during pregnancy discussed in detail. Hydration discussed use of Tylenol discussed. Advised patient if she continues to have symptoms consider urgent care or emergency department for follow-up.  Orders Orders Placed This Encounter  Procedures  . POCT urinalysis dipstick    No orders of the defined types were placed in this encounter.       F/U  Return for As scheduled. I spent 12 minutes with this patient of which greater than 50% was spent discussing flu versus URI symptoms follow-up and medications during pregnancy.  Elonda Husky, M.D. 06/18/2016 4:07 PM

## 2016-06-22 ENCOUNTER — Telehealth: Payer: Self-pay

## 2016-06-22 NOTE — Telephone Encounter (Signed)
Called patient to see how she was feeling. States she did not go to ER for flu testing- states she didn't want to expose herself to poss flu virus. Also states she is feeling alittle better but she is not sleeping. Suggested Tylenol PM or 1/2 Unisom. Patient was encouraged to call back with any further questions/concerns.

## 2016-07-15 ENCOUNTER — Encounter: Payer: Self-pay | Admitting: Obstetrics and Gynecology

## 2016-07-21 ENCOUNTER — Ambulatory Visit (INDEPENDENT_AMBULATORY_CARE_PROVIDER_SITE_OTHER): Payer: Self-pay | Admitting: Obstetrics and Gynecology

## 2016-07-21 ENCOUNTER — Other Ambulatory Visit: Payer: Self-pay | Admitting: Obstetrics and Gynecology

## 2016-07-21 ENCOUNTER — Other Ambulatory Visit: Payer: Self-pay

## 2016-07-21 VITALS — BP 115/76 | HR 76 | Wt 213.7 lb

## 2016-07-21 DIAGNOSIS — Z98891 History of uterine scar from previous surgery: Secondary | ICD-10-CM

## 2016-07-21 DIAGNOSIS — Z8639 Personal history of other endocrine, nutritional and metabolic disease: Secondary | ICD-10-CM

## 2016-07-21 DIAGNOSIS — O09521 Supervision of elderly multigravida, first trimester: Secondary | ICD-10-CM

## 2016-07-21 DIAGNOSIS — O09299 Supervision of pregnancy with other poor reproductive or obstetric history, unspecified trimester: Secondary | ICD-10-CM

## 2016-07-21 DIAGNOSIS — O09211 Supervision of pregnancy with history of pre-term labor, first trimester: Secondary | ICD-10-CM

## 2016-07-21 DIAGNOSIS — F32A Depression, unspecified: Secondary | ICD-10-CM

## 2016-07-21 DIAGNOSIS — O09891 Supervision of other high risk pregnancies, first trimester: Secondary | ICD-10-CM

## 2016-07-21 DIAGNOSIS — O99281 Endocrine, nutritional and metabolic diseases complicating pregnancy, first trimester: Secondary | ICD-10-CM

## 2016-07-21 DIAGNOSIS — I10 Essential (primary) hypertension: Secondary | ICD-10-CM

## 2016-07-21 DIAGNOSIS — Z1379 Encounter for other screening for genetic and chromosomal anomalies: Secondary | ICD-10-CM

## 2016-07-21 DIAGNOSIS — F329 Major depressive disorder, single episode, unspecified: Secondary | ICD-10-CM

## 2016-07-21 DIAGNOSIS — O99341 Other mental disorders complicating pregnancy, first trimester: Secondary | ICD-10-CM

## 2016-07-21 DIAGNOSIS — E669 Obesity, unspecified: Secondary | ICD-10-CM

## 2016-07-21 DIAGNOSIS — E039 Hypothyroidism, unspecified: Secondary | ICD-10-CM

## 2016-07-21 LAB — POCT URINALYSIS DIPSTICK
Bilirubin, UA: NEGATIVE
Glucose, UA: NEGATIVE
Ketones, UA: NEGATIVE
Nitrite, UA: NEGATIVE
Protein, UA: NEGATIVE
Spec Grav, UA: 1.03 (ref 1.030–1.035)
Urobilinogen, UA: 2 — AB (ref ?–2.0)
pH, UA: 6 (ref 5.0–8.0)

## 2016-07-21 NOTE — Progress Notes (Signed)
OBSTETRIC INITIAL PRENATAL VISIT  Subjective:    Catherine Berg is being seen today for her first obstetrical visit.  This is not a planned pregnancy. She is a [id number] female at [redacted]w[redacted]d gestation, Estimated Date of Delivery: 01/30/17 with Patient's last menstrual period was 04/25/2016, consistent with 7 week sono. Her obstetrical history is significant for advanced maternal age, obesity, chronic HTN, pre-eclampsia in prior pregnancy, h/o depression, prior preterm delivery, and Graves disease. Relationship with FOB: significant other, not living together. Patient does intend to breast feed. Pregnancy history fully reviewed.     Obstetric History   G4   P2   T1   P1   A0   L2    SAB0   TAB0   Ectopic0   Multiple0   Live Births0     # Outcome Date GA Lbr Len/2nd Weight Sex Delivery Anes PTL Lv  4 Current           3 Preterm 07/29/06 [redacted]w[redacted]d  5 lb 9 oz (2.523 kg) F CS-Unspec  N   2 Term 04/15/04 [redacted]w[redacted]d  5 lb 6 oz (2.438 kg) M CS-Unspec  N      Complications: Hypertension  1 Gravida             Obstetric Comments  1st Menstrual Cycle:  10  1st Pregnancy:  23    Gynecologic History:  Last pap smear was 06/04/2016.  Results were normal.   Admits h/o abnormal pap smear in the past (2008, had colposcopy and polypectomy).  Denies history of STIs.    Past Medical History:  Diagnosis Date  . Abnormal thyroid blood test   . Anxiety   . 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 Graves disease   . History of abnormal cervical Pap smear   . History of cervical polypectomy   . Hives 09/02/2015  . Hypertension   . IFG (impaired fasting glucose)   . Low serum vitamin D   . Obesity   . Pregnancy induced hypertension    with 1st pregnancy, normal pressure with 2nd  . Reflux   . Thyroid disease   . Vitamin B12 deficiency   . Vitamin D deficiency disease      Family History  Problem Relation Age of Onset  . Heart attack Mother 36  . Hypertension Mother   . Asthma Mother    . Cancer Mother     laryngeal  . Diabetes Mother     pre diabetic  . Heart disease Mother   . Mental illness Mother   . Alcohol abuse Mother   . Drug abuse Mother   . Depression Mother   . Anxiety disorder Mother   . Bipolar disorder Mother   . Learning disabilities Brother   . ADD / ADHD Brother   . Asthma Son   . Hyperlipidemia Brother   . Alcohol abuse Father   . Heart disease Maternal Grandmother     heart attack, pacemaker  . Heart attack Maternal Grandmother   . Hypertension Maternal Grandmother   . Hypertension Maternal Grandfather   . Heart disease Paternal Grandmother     couple of major open heart surgeries, leaking valves  . Hypertension Paternal Grandmother   . Hypertension Paternal Grandfather      Past Surgical History:  Procedure Laterality Date  . CERVICAL POLYPECTOMY    . CESAREAN SECTION     x 2  . COLON SURGERY       Social  History   Social History  . Marital status: Single    Spouse name: N/A  . Number of children: N/A  . Years of education: N/A   Occupational History  . Not on file.   Social History Main Topics  . Smoking status: Never Smoker  . Smokeless tobacco: Never Used  . Alcohol use No  . Drug use: No  . Sexual activity: Yes    Birth control/ protection: None   Other Topics Concern  . Not on file   Social History Narrative  . No narrative on file     No current outpatient prescriptions on file prior to visit.   No current facility-administered medications on file prior to visit.      Allergies  Allergen Reactions  . Latex Hives  . Shellfish Allergy Hives      Review of Systems General:Not Present- Fever, Weight Loss and Weight Gain. Skin:Not Present- Rash. HEENT:Not Present- Blurred Vision, Headache and Bleeding Gums. Respiratory:Not Present- Difficulty Breathing. Breast:Not Present- Breast Mass. Cardiovascular:Not Present- Chest Pain, Elevated Blood Pressure, Fainting / Blacking Out and Shortness  of Breath. Gastrointestinal:Not Present- Abdominal Pain, Constipation, Nausea and Vomiting. Female Genitourinary:Not Present- Frequency, Painful Urination, Pelvic Pain, Vaginal Bleeding, Vaginal Discharge, Contractions, regular, Fetal Movements Decreased, Urinary Complaints and Vaginal Fluid. Musculoskeletal:Not Present- Back Pain and Leg Cramps. Neurological:Not Present- Dizziness. Psychiatric:Not Present- Depression.     Objective:   Blood pressure 115/76, pulse 76, weight 213 lb 11.2 oz (96.9 kg), last menstrual period 04/25/2016.  Body mass index is 37.86 kg/m.   General Appearance:    Alert, cooperative, no distress, appears stated age, moderate obesity  Head:    Normocephalic, without obvious abnormality, atraumatic  Eyes:    PERRL, conjunctiva/corneas clear, EOM's intact, both eyes  Ears:    Normal external ear canals, both ears  Nose:   Nares normal, septum midline, mucosa normal, no drainage or sinus tenderness  Throat:   Lips, mucosa, and tongue normal; teeth and gums normal  Neck:   Supple, symmetrical, trachea midline, no adenopathy; thyroid: no enlargement/tenderness/nodules; no carotid bruit or JVD  Back:     Symmetric, no curvature, ROM normal, no CVA tenderness  Lungs:     Clear to auscultation bilaterally, respirations unlabored  Chest Wall:    No tenderness or deformity   Heart:    Regular rate and rhythm, S1 and S2 normal, no murmur, rub or gallop  Breast Exam:    No tenderness, masses, or nipple abnormality  Abdomen:     Soft, non-tender, bowel sounds active all four quadrants, no masses, no organomegaly.  FHT 160 bpm.  Well healed Pfannenstiel incision.   Genitalia:    Pelvic:external genitalia normal, vagina without lesions, discharge, or tenderness, rectovaginal septum  normal. Cervix normal in appearance, no cervical motion tenderness, no adnexal masses or tenderness.  Pregnancy positive findings: uterine enlargement: 13 wk size, nontender.   Rectal:    Normal  external sphincter.  No hemorrhoids appreciated. Internal exam not done.   Extremities:   Extremities normal, atraumatic, no cyanosis or edema  Pulses:   2+ and symmetric all extremities  Skin:   Skin color, texture, turgor normal, no rashes or lesions  Lymph nodes:   Cervical, supraclavicular, and axillary nodes normal  Neurologic:   CNII-XII intact, normal strength, sensation and reflexes throughout    Assessment:    Pregnancy at 12 and 3/7 weeks  cHTN (on no meds) H/o pre-eclampsia in prior pregnancy H/o depression Graves disease H/o C-section x  2 Advanced maternal age Obesity (BMI 38)  Plan:  1. Pregnancy at 12 and 3/7 weeks  - Initial labs reviewed. - Prenatal vitamins encouraged. - Problem list reviewed and updated. - New OB counseling:  The patient has been given an overview regarding routine prenatal care.  - Benefits of Breast Feeding were discussed. The patient is encouraged to consider nursing her baby post partum.  2. cHTN (on no meds) - Patient notes that she was taken off her meds (Maxzide 25 mg daily) ~ 1 year ago and has been managing with dietary modifications and exercise.  Will continue to monitor during pregnancy.  Will order baseline PIH labs.  - Will need to begin a daily baby aspirin (81 mg) in pregnancy beginning at ~ [redacted] weeks gestation.    3. H/o pre-eclampsia in prior pregnancy - Patient notes pre-eclampsia in last pregnancy, thinks that she only had GHTN in 1st pregnancy.  Discussed risks of development of PIH in subsequent pregnancies.  Encouraged baby aspirin supplementation.   4. H/o depression - Patient notes mild depressive symptoms over the past several months, has been "dealing with a lot, including grieving over the anniversary of the death of her mother who passed 2 years ago).  Discussion had with patient regarding symptoms.  Notes that she can get out of bed and function most days, but does have moments of tearfulness and lack of motivation.   Denies SI/HI.  Offered referral to Psychologist/counselor, but patient declined.  Also declined medications.  Notes that she can usually "pull herself out of her moods". Advised that if symptoms worsen to inform MD.   5. Graves disease s/p ablation, now with hypothyrodism - Patient currently on Levothyroxine for h/o Graves disease s/p thyroid ablation. Now with hypothyroidism Will continue to monitor thyroid levels each trimester.  Currently wnl. Will monitor fetal growth during pregnancy with ultrasounds.   6. H/o C-section x 2 - Discussed reasons for C-section (early delivery for PIH).  Reports failed IOL in 1st pregnancy, 2nd section was due to h/o prior C-section.  Based on this, recommendations are for repeat C-section.  Patient ok with plan.    7. Advanced maternal age - Prenatal testing, optional genetic testing, and ultrasound use in pregnancy were reviewed.  AFP3 discussed: ordered.  8. Obesity (BMI 38) -  Recommendations regarding diet, weight gain, and exercise in pregnancy were given. - Will need early glucola.  To perform by next visit.   9. Prior preterm delivery x 2 - due to h/o PIH.  No evidence of preterm labor or ruptured membranes.  Not a candidate for 17-OHP.    Follow up in 4 weeks.  50% of 30 min visit spent on counseling and coordination of care.     Hildred Laser, MD [facility]

## 2016-07-21 NOTE — Progress Notes (Deleted)
ROB

## 2016-07-22 ENCOUNTER — Other Ambulatory Visit: Payer: Self-pay | Admitting: Obstetrics and Gynecology

## 2016-07-22 ENCOUNTER — Ambulatory Visit (INDEPENDENT_AMBULATORY_CARE_PROVIDER_SITE_OTHER): Payer: Self-pay

## 2016-07-22 DIAGNOSIS — O09521 Supervision of elderly multigravida, first trimester: Secondary | ICD-10-CM | POA: Insufficient documentation

## 2016-07-22 DIAGNOSIS — Z1379 Encounter for other screening for genetic and chromosomal anomalies: Secondary | ICD-10-CM

## 2016-07-22 LAB — HEPATIC FUNCTION PANEL
ALT: 17 IU/L (ref 0–32)
AST: 21 IU/L (ref 0–40)
Alkaline Phosphatase: 51 IU/L (ref 39–117)
Bilirubin Total: 0.2 mg/dL (ref 0.0–1.2)
Bilirubin, Direct: 0.1 mg/dL (ref 0.00–0.40)
Total Protein: 7.2 g/dL (ref 6.0–8.5)

## 2016-07-22 LAB — URINALYSIS, ROUTINE W REFLEX MICROSCOPIC
Bilirubin, UA: NEGATIVE
Glucose, UA: NEGATIVE
Leukocytes, UA: NEGATIVE
Nitrite, UA: NEGATIVE
RBC, UA: NEGATIVE
Specific Gravity, UA: >=1.03 — AB (ref 1.005–1.030)
Urobilinogen, Ur: 1 mg/dL (ref 0.2–1.0)
pH, UA: 5.5 (ref 5.0–7.5)

## 2016-07-22 LAB — RENAL FUNCTION PANEL
Albumin: 3.9 g/dL (ref 3.5–5.5)
BUN/Creatinine Ratio: 13 (ref 9–23)
BUN: 9 mg/dL (ref 6–20)
CO2: 20 mmol/L (ref 18–29)
Calcium: 9.4 mg/dL (ref 8.7–10.2)
Chloride: 100 mmol/L (ref 96–106)
Creatinine, Ser: 0.68 mg/dL (ref 0.57–1.00)
GFR calc Af Amer: 130 mL/min/{1.73_m2} (ref >59)
GFR calc non Af Amer: 113 mL/min/{1.73_m2} (ref >59)
Glucose: 73 mg/dL (ref 65–99)
Phosphorus: 3.8 mg/dL (ref 2.5–4.5)
Potassium: 4.3 mmol/L (ref 3.5–5.2)
Sodium: 137 mmol/L (ref 134–144)

## 2016-07-22 LAB — MONITOR DRUG PROFILE 14(MW)
Amphetamine Scrn, Ur: NEGATIVE ng/mL
BARBITURATE SCREEN URINE: NEGATIVE ng/mL
BENZODIAZEPINE SCREEN, URINE: NEGATIVE ng/mL
Buprenorphine, Urine: NEGATIVE ng/mL
CANNABINOIDS UR QL SCN: NEGATIVE ng/mL
Cocaine (Metab) Scrn, Ur: NEGATIVE ng/mL
Creatinine(Crt), U: 233.3 mg/dL (ref 20.0–300.0)
Fentanyl, Urine: NEGATIVE pg/mL
Meperidine Screen, Urine: NEGATIVE ng/mL
Methadone Screen, Urine: NEGATIVE ng/mL
OXYCODONE+OXYMORPHONE UR QL SCN: NEGATIVE ng/mL
Opiate Scrn, Ur: NEGATIVE ng/mL
Ph of Urine: 5.7 (ref 4.5–8.9)
Phencyclidine Qn, Ur: NEGATIVE ng/mL
Propoxyphene Scrn, Ur: NEGATIVE ng/mL
SPECIFIC GRAVITY: 1.034
Tramadol Screen, Urine: NEGATIVE ng/mL

## 2016-07-22 LAB — PROTEIN / CREATININE RATIO, URINE
Creatinine, Urine: 233.2 mg/dL
Protein, Ur: 14.9 mg/dL
Protein/Creat Ratio: 64 mg/g creat (ref 0–200)

## 2016-07-22 LAB — URIC ACID: Uric Acid: 3.4 mg/dL (ref 2.5–7.1)

## 2016-07-23 ENCOUNTER — Other Ambulatory Visit: Payer: Self-pay

## 2016-07-23 LAB — CBC WITH DIFFERENTIAL/PLATELET
Basophils Absolute: 0 10*3/uL (ref 0.0–0.2)
Basos: 0 %
EOS (ABSOLUTE): 0.1 10*3/uL (ref 0.0–0.4)
Eos: 1 %
Hematocrit: 36.3 % (ref 34.0–46.6)
Hemoglobin: 12.5 g/dL (ref 11.1–15.9)
Immature Grans (Abs): 0 10*3/uL (ref 0.0–0.1)
Immature Granulocytes: 0 %
Lymphocytes Absolute: 3.1 10*3/uL (ref 0.7–3.1)
Lymphs: 37 %
MCH: 30.4 pg (ref 26.6–33.0)
MCHC: 34.4 g/dL (ref 31.5–35.7)
MCV: 88 fL (ref 79–97)
Monocytes Absolute: 0.6 10*3/uL (ref 0.1–0.9)
Monocytes: 7 %
Neutrophils Absolute: 4.6 10*3/uL (ref 1.4–7.0)
Neutrophils: 55 %
Platelets: 329 10*3/uL (ref 150–379)
RBC: 4.11 x10E6/uL (ref 3.77–5.28)
RDW: 14.7 % (ref 12.3–15.4)
WBC: 8.3 10*3/uL (ref 3.4–10.8)

## 2016-07-23 LAB — HEPATITIS B SURFACE ANTIGEN: Hepatitis B Surface Ag: NEGATIVE

## 2016-07-23 LAB — ANTIBODY SCREEN: Antibody Screen: NEGATIVE

## 2016-07-23 LAB — HEMOGLOBIN A1C
Est. average glucose Bld gHb Est-mCnc: 94 mg/dL
Hgb A1c MFr Bld: 4.9 % (ref 4.8–5.6)

## 2016-07-23 LAB — TSH: TSH: 4.59 u[IU]/mL — ABNORMAL HIGH (ref 0.450–4.500)

## 2016-07-23 LAB — URINE CULTURE

## 2016-07-23 LAB — SICKLE CELL SCREEN: Sickle Cell Screen: NEGATIVE

## 2016-07-23 LAB — ABO AND RH: Rh Factor: POSITIVE

## 2016-07-23 LAB — VARICELLA ZOSTER ANTIBODY, IGG: Varicella zoster IgG: 331 index (ref >165)

## 2016-07-23 LAB — RPR: RPR Ser Ql: NONREACTIVE

## 2016-07-23 LAB — GC/CHLAMYDIA PROBE AMP
Chlamydia trachomatis, NAA: NEGATIVE
Neisseria gonorrhoeae by PCR: NEGATIVE

## 2016-07-23 LAB — RUBELLA SCREEN: Rubella Antibodies, IGG: 2.36 index (ref >0.99)

## 2016-07-23 LAB — HIV ANTIBODY (ROUTINE TESTING W REFLEX): HIV Screen 4th Generation wRfx: NONREACTIVE

## 2016-07-23 LAB — CREATINE: Creatine, Serum: 0.6 mg/dL (ref 0.1–1.0)

## 2016-07-28 LAB — FIRST TRIMESTER SCREEN W/NT
CRL: 76.6 mm
DIA MoM: 2.12
DIA Value: 372.7 pg/mL
Gest Age-Collect: 13.3 weeks
Maternal Age At EDD: 37.1 years
Nuchal Translucency MoM: 1.63
Nuchal Translucency: 2.1 mm
Number of Fetuses: 1
PAPP-A MoM: 2.68
PAPP-A Value: 2196.8 ng/mL
Test Results:: POSITIVE — AB
Weight: 213 [lb_av]
hCG MoM: 1.61
hCG Value: 100.3 IU/mL

## 2016-07-29 ENCOUNTER — Encounter: Payer: Self-pay | Admitting: Obstetrics and Gynecology

## 2016-08-03 ENCOUNTER — Telehealth: Payer: Self-pay | Admitting: Obstetrics and Gynecology

## 2016-08-03 NOTE — Telephone Encounter (Signed)
Patient called stating she is experiencing lower abdominal pain and pressure after urination. She is [redacted] weeks pregnant. Please Advise. Thanks

## 2016-08-04 NOTE — Telephone Encounter (Signed)
Added to Cherrys schedule tomorrow morning.

## 2016-08-04 NOTE — Telephone Encounter (Signed)
Can we call this pt and have her added to Dr.[name]'s schedule for possible UTI as well as discuss lab results. Thanks

## 2016-08-05 ENCOUNTER — Encounter: Payer: Self-pay | Admitting: Obstetrics and Gynecology

## 2016-08-05 ENCOUNTER — Encounter: Payer: Self-pay | Admitting: Family Medicine

## 2016-08-05 ENCOUNTER — Ambulatory Visit (INDEPENDENT_AMBULATORY_CARE_PROVIDER_SITE_OTHER): Payer: Self-pay | Admitting: Obstetrics and Gynecology

## 2016-08-05 VITALS — BP 108/69 | HR 80 | Wt 215.1 lb

## 2016-08-05 DIAGNOSIS — R399 Unspecified symptoms and signs involving the genitourinary system: Secondary | ICD-10-CM

## 2016-08-05 DIAGNOSIS — O285 Abnormal chromosomal and genetic finding on antenatal screening of mother: Secondary | ICD-10-CM

## 2016-08-05 LAB — POCT URINALYSIS DIPSTICK
Bilirubin, UA: NEGATIVE
Glucose, UA: NEGATIVE
Ketones, UA: NEGATIVE
Nitrite, UA: NEGATIVE
Protein, UA: NEGATIVE
Spec Grav, UA: 1.005 (ref 1.030–1.035)
Urobilinogen, UA: 2 — AB (ref ?–2.0)
pH, UA: 8 (ref 5.0–8.0)

## 2016-08-05 NOTE — Progress Notes (Signed)
Problem OB Visit: Patient complains of discomfort with urination and frequency x 1 week.  Denies hematuria, flank pain.  UA today wnl, but will send culture.  Advised on increased hydration, Azo for symptoms.    Catherine Berg is also here for consultation regarding abnormal 1st trimester screen.  She has an increased risk of Down Syndrome (1:210, normal cut-off for age is 1:250).  Discussion had that 1st trimester screen is a screening test, and does not confirm diagnosis of Down Syndrome.  Recommend referral to [facility] for further genetic counseling. Briefly discussed options with patient, including testing with Panorama (circulating cell-free DNA testing) which has a higher sensitivity, and/or further discussion and testing with MFM (including ultrasounds, amniocentesis). Patient desires to proceed with Panorama today.  Will order.  Referral to MFM made for further genetic counseling and discussion of results and testing options.    Medicaid form completed today.    A total of 15 minutes were spent face-to-face with the patient during this encounter and over half of that time dealt with counseling and coordination of care.  Hildred Laser, MD [facility]

## 2016-08-07 LAB — URINE CULTURE

## 2016-08-10 ENCOUNTER — Encounter: Payer: Self-pay | Admitting: Obstetrics and Gynecology

## 2016-08-11 ENCOUNTER — Encounter: Payer: Self-pay | Admitting: Family Medicine

## 2016-08-11 ENCOUNTER — Ambulatory Visit (INDEPENDENT_AMBULATORY_CARE_PROVIDER_SITE_OTHER): Payer: Self-pay | Admitting: Family Medicine

## 2016-08-11 DIAGNOSIS — O99281 Endocrine, nutritional and metabolic diseases complicating pregnancy, first trimester: Secondary | ICD-10-CM

## 2016-08-11 DIAGNOSIS — E538 Deficiency of other specified B group vitamins: Secondary | ICD-10-CM

## 2016-08-11 DIAGNOSIS — E039 Hypothyroidism, unspecified: Secondary | ICD-10-CM

## 2016-08-11 DIAGNOSIS — F329 Major depressive disorder, single episode, unspecified: Secondary | ICD-10-CM

## 2016-08-11 DIAGNOSIS — E559 Vitamin D deficiency, unspecified: Secondary | ICD-10-CM

## 2016-08-11 DIAGNOSIS — F32A Depression, unspecified: Secondary | ICD-10-CM

## 2016-08-11 DIAGNOSIS — N289 Disorder of kidney and ureter, unspecified: Secondary | ICD-10-CM

## 2016-08-11 DIAGNOSIS — O99342 Other mental disorders complicating pregnancy, second trimester: Secondary | ICD-10-CM

## 2016-08-11 DIAGNOSIS — O99282 Endocrine, nutritional and metabolic diseases complicating pregnancy, second trimester: Secondary | ICD-10-CM

## 2016-08-11 DIAGNOSIS — R7301 Impaired fasting glucose: Secondary | ICD-10-CM

## 2016-08-11 LAB — T4, FREE: Free T4: 0.9 ng/dL (ref 0.8–1.8)

## 2016-08-11 LAB — TSH: TSH: 2.59 mIU/L

## 2016-08-11 LAB — VITAMIN B12: Vitamin B-12: 326 pg/mL (ref 200–1100)

## 2016-08-11 NOTE — Assessment & Plan Note (Addendum)
Refer to psychologist; note sent to her OB to see about starting her on medication; will also check thyroid at patient's request and send to ENDO; she denies SI/HI

## 2016-08-11 NOTE — Assessment & Plan Note (Signed)
Resolved; last Cr was 0.6

## 2016-08-11 NOTE — Progress Notes (Signed)
BP 114/70   Pulse 95   Temp 97.7 F (36.5 C) (Oral)   Resp 16   Wt 214 lb 8 oz (97.3 kg)   LMP 04/25/2016 (Exact Date)   SpO2 99%   BMI 38.00 kg/m    Subjective:    Patient ID: Catherine Berg, female    DOB: 04-05-1981, 36 y.o.   MRN: [id number]  HPI: Catherine Berg is a 36 y.o. female  Chief Complaint  Patient presents with  . Manic Behavior    Concerns about her mental status   . Labs Only    Metabolic panel   Patient is here for depression Patient denies manic behavior More depression Children don't know about her pregnancy; found out increased risk for child with Down syndrome, abnormal test No HI/SI Lower abdominal cramping, no bleeding, nothing in lower back; just right side; no hx of kidney stones No urinary odor No fevers Was supposed to see thyroid doctor today, but patient asked if I can check labs today since she couldn't get to that appointment Weight gain more than expected; Dr. Aliene Altes says related to thyroid Really tired OB-GYN suggested medicine for mood last time Downward mood started 1-2 weeks Note from Dr. Vanice Sarah, increased med to 88 mcg daily; 07/07/16 Creatinine 0.6 on 07/21/16 CBC normal They are watching her glucose carefully BP lower than usual  Depression screen [facility] 2/9 08/11/2016 04/22/2016 01/08/2016 11/07/2015 09/04/2015  Decreased Interest 1 0 0 0 0  Down, Depressed, Hopeless 1 0 1 0 0  PHQ - 2 Score 2 0 1 0 0  Altered sleeping 1 - - - -  Tired, decreased energy 1 - - - -  Change in appetite 0 - - - -  Feeling bad or failure about yourself  1 - - - -  Trouble concentrating 0 - - - -  Moving slowly or fidgety/restless 1 - - - -  Suicidal thoughts 0 - - - -  PHQ-9 Score 6 - - - -  Difficult doing work/chores Somewhat difficult - - - -  Some recent data might be hidden   Relevant past medical, surgical, family and social history reviewed Past Medical History:  Diagnosis Date  . Abnormal thyroid blood test   . Anxiety   . Elevated serum  glutamic pyruvic transaminase (SGPT) level   . Graves disease   . History of abnormal cervical Pap smear   . History of cervical polypectomy   . Hives 09/02/2015  . Hypertension   . IFG (impaired fasting glucose)   . Low serum vitamin D   . Obesity   . Pregnancy induced hypertension    with 1st pregnancy, normal pressure with 2nd  . Reflux   . Thyroid disease   . Vitamin B12 deficiency   . Vitamin D deficiency disease    Past Surgical History:  Procedure Laterality Date  . CERVICAL POLYPECTOMY    . CESAREAN SECTION     x 2  . COLON SURGERY     Family History  Problem Relation Age of Onset  . Heart attack Mother 46  . Hypertension Mother   . Asthma Mother   . Cancer Mother     laryngeal  . Diabetes Mother     pre diabetic  . Heart disease Mother   . Mental illness Mother   . Alcohol abuse Mother   . Drug abuse Mother   . Depression Mother   . Anxiety disorder Mother   . Bipolar disorder  Mother   . Learning disabilities Brother   . ADD / ADHD Brother   . Asthma Son   . Hyperlipidemia Brother   . Alcohol abuse Father   . Heart disease Maternal Grandmother     heart attack, pacemaker  . Heart attack Maternal Grandmother   . Hypertension Maternal Grandmother   . Hypertension Maternal Grandfather   . Heart disease Paternal Grandmother     couple of major open heart surgeries, leaking valves  . Hypertension Paternal Grandmother   . Hypertension Paternal Grandfather    Social History  Substance Use Topics  . Smoking status: Never Smoker  . Smokeless tobacco: Never Used  . Alcohol use No    Interim medical history since last visit reviewed. Allergies and medications reviewed  Review of Systems Per HPI unless specifically indicated above     Objective:    BP 114/70   Pulse 95   Temp 97.7 F (36.5 C) (Oral)   Resp 16   Wt 214 lb 8 oz (97.3 kg)   LMP 04/25/2016 (Exact Date)   SpO2 99%   BMI 38.00 kg/m   Wt Readings from Last 3 Encounters:  08/11/16  214 lb 8 oz (97.3 kg)  08/05/16 215 lb 1.6 oz (97.6 kg)  07/21/16 213 lb 11.2 oz (96.9 kg)    Physical Exam  Constitutional: She appears well-developed and well-nourished.  obese  Cardiovascular: Normal rate and regular rhythm.   Pulmonary/Chest: Effort normal.  Skin:  No palmar erythema, no nailbed pallor  Psychiatric: Her mood appears not anxious. Her affect is blunt. Her affect is not labile and not inappropriate. Her speech is not rapid and/or pressured, not delayed, not tangential and not slurred. She is not agitated, not aggressive, not hyperactive, not slowed and not combative. Thought content is not delusional. She exhibits a depressed mood. She expresses no homicidal and no suicidal ideation.  Flat affect; good eye contact with examiner though She is attentive.    Results for orders placed or performed in visit on 08/05/16  Urine culture  Result Value Ref Range   Urine Culture, Routine Final report    Urine Culture result 1 Comment   POCT urinalysis dipstick  Result Value Ref Range   Color, UA yellow    Clarity, UA clear    Glucose, UA neg    Bilirubin, UA neg    Ketones, UA neg    Spec Grav, UA 1.005 1.030 - 1.035   Blood, UA NHT    pH, UA 8.0 5.0 - 8.0   Protein, UA neg    Urobilinogen, UA 2.0 (A) Negative - 2.0   Nitrite, UA neg    Leukocytes, UA Trace (A) Negative      Assessment & Plan:   Problem List Items Addressed This Visit      Endocrine   IFG (impaired fasting glucose)    Normal A1c last month      Hypothyroid in pregnancy, antepartum, second trimester    Check TSH and free T4        Genitourinary   Renal insufficiency    Resolved; last Cr was 0.6        Other   Vitamin D deficiency disease    Check and replace if needed      Relevant Orders   VITAMIN D 25 Hydroxy (Vit-D Deficiency, Fractures)   Vitamin B12 deficiency    Check B12      Relevant Orders   B12   Depression affecting pregnancy  in second trimester, antepartum     Refer to psychologist; note sent to her OB to see about starting her on medication; will also check thyroid at patient's request and send to ENDO; she denies SI/HI      Relevant Orders   Ambulatory referral to Psychology      Follow up plan: No Follow-up on file.  An after-visit summary was printed and given to the patient at check-out.  Please see the patient instructions which may contain other information and recommendations beyond what is mentioned above in the assessment and plan.  No orders of the defined types were placed in this encounter.   Orders Placed This Encounter  Procedures  . TSH  . T4, free  . B12  . VITAMIN D 25 Hydroxy (Vit-D Deficiency, Fractures)  . Ambulatory referral to Psychology

## 2016-08-11 NOTE — Patient Instructions (Signed)
I've contacted Dr. Valentino Saxon and she should contact you about starting medicine Please call or write to her if you've not heard back in 24 hours from her We'll get labs today and communicate with Dr. Nance Pear have you see the psychologist

## 2016-08-11 NOTE — Assessment & Plan Note (Signed)
Check TSH and free T4 

## 2016-08-11 NOTE — Assessment & Plan Note (Signed)
Check B12 

## 2016-08-11 NOTE — Assessment & Plan Note (Signed)
Normal A1c last month

## 2016-08-11 NOTE — Assessment & Plan Note (Signed)
Check and replace if needed

## 2016-08-12 ENCOUNTER — Telehealth: Payer: Self-pay | Admitting: Obstetrics and Gynecology

## 2016-08-12 LAB — VITAMIN D 25 HYDROXY (VIT D DEFICIENCY, FRACTURES): Vit D, 25-Hydroxy: 19 ng/mL — ABNORMAL LOW (ref 30–100)

## 2016-08-12 NOTE — Telephone Encounter (Signed)
Call from [facility]  Patient is scheduled for a consult: Thursday, April 19th to arrive @ 9am for a 10am appointment   Left message for patient to call for appointment

## 2016-08-13 ENCOUNTER — Other Ambulatory Visit: Payer: Self-pay | Admitting: *Deleted

## 2016-08-13 DIAGNOSIS — O289 Unspecified abnormal findings on antenatal screening of mother: Secondary | ICD-10-CM

## 2016-08-14 ENCOUNTER — Telehealth: Payer: Self-pay

## 2016-08-14 DIAGNOSIS — R4589 Other symptoms and signs involving emotional state: Secondary | ICD-10-CM

## 2016-08-14 DIAGNOSIS — F329 Major depressive disorder, single episode, unspecified: Principal | ICD-10-CM

## 2016-08-14 MED ORDER — SERTRALINE HCL 50 MG PO TABS: 50.0000 mg | ORAL_TABLET | Freq: Every day | ORAL | 3 refills | Status: DC

## 2016-08-14 NOTE — Telephone Encounter (Signed)
Called pt no answer, LM for pt informing her of Zoloft sent in to pharmacy.

## 2016-08-14 NOTE — Telephone Encounter (Signed)
-----   Message from Hildred Laser, MD sent at 08/13/2016  9:24 PM EDT ----- Regarding: FW: patient depressed, no SI/HI Please inform patient that I received a message from Dr. Sherie Don regarding possibly starting medication for depression. She can start on Zoloft 50 mg. We will see how she is doing when she comes in for her next appointment.    ----- Message ----- From: Kerman Passey, MD Sent: 08/11/2016   3:13 PM To: Hildred Laser, MD Subject: patient depressed, no SI/HI                    Patient talked with you about starting medicine, would you consider prescribing? Thank you!

## 2016-08-17 ENCOUNTER — Ambulatory Visit
Admission: RE | Admit: 2016-08-17 | Discharge: 2016-08-17 | Disposition: A | Discharge status: Home / Self Care | Payer: Medicaid Other | Source: Ambulatory Visit | Attending: Obstetrics and Gynecology | Admitting: Obstetrics and Gynecology

## 2016-08-17 ENCOUNTER — Ambulatory Visit (HOSPITAL_COMMUNITY)
Admission: RE | Admit: 2016-08-17 | Discharge: 2016-08-17 | Disposition: A | Discharge status: Home / Self Care | Payer: Medicaid Other | Source: Ambulatory Visit | Attending: Obstetrics and Gynecology | Admitting: Obstetrics and Gynecology

## 2016-08-17 DIAGNOSIS — O09522 Supervision of elderly multigravida, second trimester: Secondary | ICD-10-CM | POA: Diagnosis not present

## 2016-08-17 DIAGNOSIS — O289 Unspecified abnormal findings on antenatal screening of mother: Secondary | ICD-10-CM

## 2016-08-17 NOTE — Progress Notes (Signed)
Referring Physician:  [facility] Time of Consultation: 40 minutes   Catherine Berg was referred to [facility] [place] for genetic counseling because of an increased risk for Down syndrome by Catherine first trimester prenatal screen followed by a normal result on Panorama testing.  This note summarizes Catherine information we discussed.   We provided background information on Catherine first trimester prenatal screen.  Screening was performed at [facility] which included nuchal translucency ultrasound and first trimester maternal serum marker screening (PAPP-A, Free beta hCG and DIA).  Catherine nuchal translucency has approximately an 80% detection rate for Down syndrome and can be positive for other chromosome abnormalities as well as heart defects.  When combined with Catherine maternal serum marker screening, Catherine detection rate is up to 86% for Down syndrome and up to 75% for trisomy 18.  This screening provides an individualized risk assessment for Down syndrome and trisomy 18.  Because it is not a diagnostic test, it cannot confirm if a baby has a chromosome difference, it simply provides a risk assessment to allow for Catherine option of additional diagnostic testing if desired.  Catherine Berg will be 36 years old at Catherine time of delivery.  We also reviewed advanced maternal age and Catherine chance for various chromosome conditions.  Maternal age is part of Catherine calculation of risk in first trimester screening. Before screening, Catherine age-related chance of Down syndrome in Catherine pregnancy was 1 in 156.  While Catherine first trimester screen results for Catherine Berg gave results that showed an "increased" Catherine chance of Down syndrome in Catherine pregnancy, Catherine chance is actually reduced compared to her age related chance.  Given Catherine first trimester screen results, Catherine chance was estimated to be 1 in 210.  This means that Catherine chance Catherine baby does not have Down syndrome is greater than 99 percent.  Catherine chance for trisomy 18 was  not increased.    [facility] then ordered cell free fetal DNA testing (Panorama) to provide additional risk assessment for Down syndrome, trisomy 52, trisomy 17 or sex chromosome conditions. This test utilizes a maternal blood sample and DNA sequencing technology to isolate circulating cell free fetal DNA from maternal plasma. Catherine fetal DNA can then be analyzed for DNA sequences that are derived from Catherine three most common chromosomes involved in aneuploidy, chromosomes 13, 18, and 21. If Catherine overall amount of DNA is greater than Catherine expected level for any of these chromosomes, aneuploidy is suspected. We explained that while we do not consider it a replacement for invasive testing and karyotype analysis, a negative result from this testing would be reassuring, though not a guarantee of a normal chromosome complement for Catherine baby. An abnormal result is certainly suggestive of an abnormal chromosome complement, though we would still recommend CVS or amniocentesis to confirm any findings from this testing.  Panorama testing was ordered through [facility] and Catherine results are within normal range.  Panorama is reported to detect greater than 99% of babies with Down syndrome and greater than 98% of babies with trisomy 32 and 72.  Gender was found to be female (XY).  This testing is greater than 99% accurate in detection of gender. Catherine chance for monosomy X, triploidy and some microdeletions was also reported as normal. While this testing cannot rule out all cases of chromosome conditions, given Catherine normal results, Catherine chance for Down syndrome in this pregnancy is very small.      Catherine Berg was scheduled for an  anatomy ultrasound today, at [redacted] weeks gestation.  Catherine detailed fetal anatomy was seen and appeared normal, see that report for details. Ultrasound uses high frequency sound waves to create an image of Catherine developing fetus.  An ultrasound is often recommended as a routine means of evaluating Catherine pregnancy.  It is  also used to screen for fetal anatomy problems (for example, a heart defect) that might be suggestive of a chromosomal or other abnormality.  Some babies with Down syndrome or other chromosome differences will show changes on this ultrasound, but many will not.  Therefore, ultrasound is not diagnostic for chromosome conditions.   If Catherine Berg were to still feel concerned about Catherine chance for a chromosome condition in Catherine pregnancy, an amniocentesis could be considered to more fully assess for chromosome conditions.  Amniocentesis is a diagnostic test to evaluate Catherine fetal chromosomes during pregnancy.  Catherine procedure involves Catherine removal of a small amount of amniotic fluid from Catherine sac surrounding Catherine fetus with Catherine use of a thin needle inserted through Catherine maternal abdomen and uterus.  Ultrasound guidance is used throughout Catherine procedure.  Fetal cells are directly evaluated to detect greater than 99% of chromosome conditions and > 98% of neural tube defects.  Catherine main risks to this procedure include complications which may lead to miscarriage in less than 1 in 200 cases (0.5%).  Given Catherine normal Panorama results, Catherine chance that Catherine pregnancy is affected with Down syndrome is expected to be less than Catherine risk from amniocentesis.  We obtained a detailed family history and pregnancy history. This is Catherine third pregnancy for Catherine Berg, Catherine first with her current partner.  Her two older children, ages 29 and 39, are in good health.  Her 56 year old son has ADD and ODD.  Her partner has four healthy children from prior relationships.  Catherine Berg reported that her mother passed away at 37 years of age following a major heart attack and ensuing health complications.  Her maternal grandmother and great grandmother also passed away at fairly young ages due to cardiovascular events.  We reviewed that Catherine understanding of cardiovascular genetics is not complete, but that it would be important to let her physicians know  about this history and follow up as desired.  If a consultation with a cardiovascular geneticist is desired, that could be considered in Catherine future. Catherine remainder of Catherine family history was reported to be unremarkable for birth defects, mental retardation, recurrent pregnancy loss or known chromosome abnormalities. Catherine pregnancy history is remarkable for taking synthroid and Zoloft.  Zoloft is not expected to increase Catherine risk for birth defects, but has been associated with a mild transient neonatal symptoms in Catherine newborn period.  Catherine Berg is of primarily African American ancestry. Her partner is also Tree surgeon. A review of her records indicate that she is "negative" for sickle cell at [facility]. However, this testing was through solubility, which is only accurate for S trait.  It is not helpful in determining if a Berg may be a carrier for other clinically significant hemoglobin variants (such as C and E trait).   If testing for hemoglobinopathies has not been performed, then screening using a CBC and hemoglobin fractionation is recommended by ACOG as Catherine most complete way to screen for sickle cell, thalassemias and other hemoglobin variants.  Based upon recent guidelines, Cystic Fibrosis and Spinal Muscular Atrophy (SMA) screening were also discussed with Catherine Berg. Both conditions are recessive, which means that both  parents must be carriers in order to have a child with Catherine disease.  Cystic fibrosis (CF) is one of Catherine most common genetic conditions in persons of Caucasian ancestry.  This condition occurs in approximately 1 in 2,500 Caucasian persons and results in thickened secretions in Catherine lungs, digestive, and reproductive systems.  For a baby to be at risk for having CF, both of Catherine parents must be carriers for this condition.  Approximately 1 in 7 Caucasian persons is a carrier for CF, while 1 in 39 persons of African American background will be a carrier.  Current carrier testing looks  for Catherine most common mutations in Catherine gene for CF and can detect approximately 90% of carriers in Catherine Caucasian population and 81% of carriers in Catherine [place] American population.  This means that Catherine carrier screening can greatly reduce, but cannot eliminate, Catherine chance for an individual to have a child with CF.  If an individual is found to be a carrier for CF, then carrier testing would be available for Catherine partner. As part of [place] [address]'s newborn screening profile, all babies born in Catherine state of [place] will have a two-tier screening process.  Specimens are first tested to determine Catherine concentration of immunoreactive trypsinogen (IRT).  Catherine top 5% of specimens with Catherine highest IRT values then undergo DNA testing using a panel of over 40 common CF mutations. SMA is a neurodegenerative disorder that leads to atrophy of skeletal muscle and overall weakness.  This condition is also more prevalent in Catherine Caucasian population, with 1 in 40-1 in 60 persons being a carrier and 1 in 6,000-1 in 10,000 children being affected.  For those of African American background, Catherine carrier frequency is 1 in 24 and Catherine disease occurs in approximately 1 in 20,000 children.  There are multiple forms of Catherine disease, with some causing death in infancy to other forms with survival into adulthood.  Catherine genetics of SMA is complex, but carrier screening can detect up to 95% of carriers in Catherine Caucasian population and 70% in Catherine African American population.  Similar to CF, a negative result can greatly reduce, but cannot eliminate, Catherine chance to have a child with SMA.  Catherine Berg declined amniocentesis and carrier screening for Catherine above conditions.    We appreciate Catherine opportunity to be involved in Catherine care of this family.  Ms.  Berg was encouraged to call us at [phone] with questions or concerns.  Cherly Anderson, MS, CGC

## 2016-08-17 NOTE — Progress Notes (Signed)
Catherine Wells, MS, CGC performed an integral service incident to the physician's initial service.  I was physically present in the clinical area and was immediately available to render assistance.   Catherine Berg  

## 2016-08-18 ENCOUNTER — Other Ambulatory Visit: Payer: Medicaid Other

## 2016-08-18 ENCOUNTER — Encounter: Payer: Self-pay | Admitting: Obstetrics and Gynecology

## 2016-08-19 ENCOUNTER — Other Ambulatory Visit: Payer: Medicaid Other

## 2016-08-19 ENCOUNTER — Encounter: Payer: Self-pay | Admitting: Obstetrics and Gynecology

## 2016-08-20 ENCOUNTER — Ambulatory Visit: Payer: Medicaid Other

## 2016-09-17 ENCOUNTER — Encounter: Payer: Self-pay | Admitting: Family Medicine

## 2016-09-22 ENCOUNTER — Encounter: Payer: Self-pay | Admitting: Family Medicine

## 2016-09-22 ENCOUNTER — Ambulatory Visit (INDEPENDENT_AMBULATORY_CARE_PROVIDER_SITE_OTHER): Payer: Medicaid Other | Admitting: Family Medicine

## 2016-09-22 VITALS — BP 116/72 | HR 92 | Temp 98.4°F | Resp 14 | Wt 208.7 lb

## 2016-09-22 DIAGNOSIS — R8290 Unspecified abnormal findings in urine: Secondary | ICD-10-CM | POA: Diagnosis not present

## 2016-09-22 DIAGNOSIS — Z3009 Encounter for other general counseling and advice on contraception: Secondary | ICD-10-CM | POA: Diagnosis not present

## 2016-09-22 DIAGNOSIS — F4541 Pain disorder exclusively related to psychological factors: Secondary | ICD-10-CM | POA: Diagnosis not present

## 2016-09-22 MED ORDER — FLUTICASONE PROPIONATE 50 MCG/ACT NA SUSP: 2.0000 | Freq: Every day | NASAL | 12 refills | Status: DC

## 2016-09-22 NOTE — Progress Notes (Signed)
BP 116/72   Pulse 92   Temp 98.4 F (36.9 C) (Oral)   Resp 14   Wt 208 lb 11.2 oz (94.7 kg)   LMP 04/25/2016 (Exact Date)   SpO2 97%   Breastfeeding? Unknown   BMI 36.97 kg/m    Subjective:    Patient ID: Catherine Berg, female    DOB: Jan 05, 1981, 36 y.o.   MRN: [id number]  HPI: KIMBERLA DRISKILL is a 36 y.o. female  Chief Complaint  Patient presents with  . Back Pain    right side. Only happens more in  movements. Started about a week ago. It currently stopped.  . Headache    Couple of days. Random headaches.    HPI She says the back pain has kind of eased off She had back pain on the right; worse with movement Nothing specific that brought it on, but did remember picking up a mop bucket the wrong way a month ago No blood in the urine No fevers No vaginal bleeding since two weeks ago (had an abortion) Has ibuprofen Right side lower leg giving a little bit, but no B/B dysfunction Request consultation for tubal On OCPs  She has been having headaches; just coming out of the blue; kind of weird Random places Feels like a heartbeat in her head Thought it was stress or high blood pressure Ibuprofen helped that Under a lot of stress No morning vomiting No TIA or stroke symptoms Needs more water Not bad allergies Was allergic to her pet;   Feels like she has an odor down there; just smells odor; not fishy; like urine smell She wonders if she is having infection again  Depression screen [facility] 2/9 09/22/2016 08/11/2016 04/22/2016 01/08/2016 11/07/2015  Decreased Interest 0 1 0 0 0  Down, Depressed, Hopeless 1 1 0 1 0  PHQ - 2 Score 1 2 0 1 0  Altered sleeping - 1 - - -  Tired, decreased energy - 1 - - -  Change in appetite - 0 - - -  Feeling bad or failure about yourself  - 1 - - -  Trouble concentrating - 0 - - -  Moving slowly or fidgety/restless - 1 - - -  Suicidal thoughts - 0 - - -  PHQ-9 Score - 6 - - -  Difficult doing work/chores - Somewhat difficult - - -    Some recent data might be hidden    Relevant past medical, surgical, family and social history reviewed Past Medical History:  Diagnosis Date  . Abnormal thyroid blood test   . Anxiety   . 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 Graves disease   . History of abnormal cervical Pap smear   . History of cervical polypectomy   . Hives 09/02/2015  . Hypertension   . IFG (impaired fasting glucose)   . Low serum vitamin D   . Obesity   . Pregnancy induced hypertension    with 1st pregnancy, normal pressure with 2nd  . Reflux   . Thyroid disease   . Vitamin B12 deficiency   . Vitamin D deficiency disease    Past Surgical History:  Procedure Laterality Date  . CERVICAL POLYPECTOMY    . CESAREAN SECTION     x 2  . COLON SURGERY     Family History  Problem Relation Age of Onset  . Heart attack Mother 39  . Hypertension Mother   . Asthma Mother   . Cancer  Mother        laryngeal  . Diabetes Mother        pre diabetic  . Heart disease Mother   . Mental illness Mother   . Alcohol abuse Mother   . Drug abuse Mother   . Depression Mother   . Anxiety disorder Mother   . Bipolar disorder Mother   . Learning disabilities Brother   . ADD / ADHD Brother   . Asthma Son   . Hyperlipidemia Brother   . Alcohol abuse Father   . Heart disease Maternal Grandmother        heart attack, pacemaker  . Heart attack Maternal Grandmother   . Hypertension Maternal Grandmother   . Hypertension Maternal Grandfather   . Heart disease Paternal Grandmother        couple of major open heart surgeries, leaking valves  . Hypertension Paternal Grandmother   . Hypertension Paternal Grandfather    Social History   Social History  . Marital status: Single    Spouse name: N/A  . Number of children: N/A  . Years of education: N/A   Occupational History  . Not on file.   Social History Main Topics  . Smoking status: Never Smoker  . Smokeless tobacco: Never Used  .  Alcohol use No  . Drug use: No  . Sexual activity: Yes    Birth control/ protection: None   Other Topics Concern  . Not on file   Social History Narrative  . No narrative on file   Interim medical history since last visit reviewed. Allergies and medications reviewed  Review of Systems Per HPI unless specifically indicated above     Objective:    BP 116/72   Pulse 92   Temp 98.4 F (36.9 C) (Oral)   Resp 14   Wt 208 lb 11.2 oz (94.7 kg)   LMP 04/25/2016 (Exact Date)   SpO2 97%   Breastfeeding? Unknown   BMI 36.97 kg/m   Wt Readings from Last 3 Encounters:  09/22/16 208 lb 11.2 oz (94.7 kg)  08/17/16 212 lb (96.2 kg)  08/11/16 214 lb 8 oz (97.3 kg)    Physical Exam  Constitutional: She appears well-developed and well-nourished.  HENT:  Mouth/Throat: Mucous membranes are normal.  Eyes: EOM are normal. No scleral icterus.  Cardiovascular: Normal rate and regular rhythm.   Pulmonary/Chest: Effort normal and breath sounds normal.  Neurological: She is alert.  Psychiatric: She has a normal mood and affect. Her behavior is normal.       Assessment & Plan:   Problem List Items Addressed This Visit    None    Visit Diagnoses    Consultation for female sterilization    -  Primary   I'll be glad to refer to OB-GYN for consultation for bilateral tubal ligation; she does not desire any more pregnancies   Relevant Orders   Ambulatory referral to Obstetrics / Gynecology   Abnormal urine odor       will check urine today, treat if indicated   Relevant Orders   Urinalysis w microscopic + reflex cultur (Completed)   Stress headaches       discussed ddx; no red flags; hydration, treat allergies, stress reduction discussed      Follow up plan: No Follow-up on file.  An after-visit summary was printed and given to the patient at check-out.  Please see the patient instructions which may contain other information and recommendations beyond what is mentioned above in  the  assessment and plan.  Meds ordered this encounter  Medications  . norethindrone-ethinyl estradiol (MICROGESTIN) 1-20 MG-MCG tablet    Sig: Take 1 tablet by mouth daily.    Dispense:  1 Package    Refill:  11  . fluticasone (FLONASE) 50 MCG/ACT nasal spray    Sig: Place 2 sprays into both nostrils daily.    Dispense:  16 g    Refill:  12    Orders Placed This Encounter  Procedures  . Urinalysis w microscopic + reflex cultur  . Ambulatory referral to Obstetrics / Gynecology

## 2016-09-22 NOTE — Patient Instructions (Addendum)
Let's start nasal spray Drink 64 ounces of water a day If you have not heard from [place] in 1 week about [facility], please call her here We'll let you know about the urine test

## 2016-09-23 LAB — URINALYSIS W MICROSCOPIC + REFLEX CULTURE
Bacteria, UA: NONE SEEN [HPF]
Bilirubin Urine: NEGATIVE
Casts: NONE SEEN [LPF]
Crystals: NONE SEEN [HPF]
Glucose, UA: NEGATIVE
Hgb urine dipstick: NEGATIVE
Ketones, ur: NEGATIVE
Leukocytes, UA: NEGATIVE
Nitrite: NEGATIVE
Protein, ur: NEGATIVE
Specific Gravity, Urine: 1.015 (ref 1.001–1.035)
WBC, UA: NONE SEEN WBC/HPF (ref <=5)
Yeast: NONE SEEN [HPF]
pH: 6.5 (ref 5.0–8.0)

## 2016-10-15 ENCOUNTER — Encounter: Payer: Self-pay | Admitting: Family Medicine

## 2016-11-17 ENCOUNTER — Encounter: Payer: Self-pay | Admitting: Family Medicine

## 2016-11-17 MED ORDER — FLUCONAZOLE 150 MG PO TABS: 150.0000 mg | ORAL_TABLET | Freq: Once | ORAL | 0 refills | Status: AC

## 2016-12-13 ENCOUNTER — Encounter: Payer: Self-pay | Admitting: Family Medicine

## 2016-12-18 ENCOUNTER — Encounter: Payer: Self-pay | Admitting: Family Medicine

## 2016-12-18 ENCOUNTER — Ambulatory Visit (INDEPENDENT_AMBULATORY_CARE_PROVIDER_SITE_OTHER): Payer: Medicaid Other | Admitting: Family Medicine

## 2016-12-18 DIAGNOSIS — R5383 Other fatigue: Secondary | ICD-10-CM | POA: Diagnosis not present

## 2016-12-18 DIAGNOSIS — E559 Vitamin D deficiency, unspecified: Secondary | ICD-10-CM

## 2016-12-18 DIAGNOSIS — E041 Nontoxic single thyroid nodule: Secondary | ICD-10-CM

## 2016-12-18 DIAGNOSIS — J4520 Mild intermittent asthma, uncomplicated: Secondary | ICD-10-CM

## 2016-12-18 DIAGNOSIS — E0590 Thyrotoxicosis, unspecified without thyrotoxic crisis or storm: Secondary | ICD-10-CM

## 2016-12-18 LAB — T4, FREE: Free T4: 1.1 ng/dL (ref 0.8–1.8)

## 2016-12-18 LAB — T3, FREE: T3, Free: 3.2 pg/mL (ref 2.3–4.2)

## 2016-12-18 LAB — CBC WITH DIFFERENTIAL/PLATELET
Basophils Absolute: 0 cells/uL (ref 0–200)
Basophils Relative: 0 %
Eosinophils Absolute: 100 cells/uL (ref 15–500)
Eosinophils Relative: 2 %
HCT: 37.1 % (ref 35.0–45.0)
Hemoglobin: 12.4 g/dL (ref 11.7–15.5)
Lymphocytes Relative: 48 %
Lymphs Abs: 2400 cells/uL (ref 850–3900)
MCH: 28.8 pg (ref 27.0–33.0)
MCHC: 33.4 g/dL (ref 32.0–36.0)
MCV: 86.3 fL (ref 80.0–100.0)
MPV: 9.4 fL (ref 7.5–12.5)
Monocytes Absolute: 350 cells/uL (ref 200–950)
Monocytes Relative: 7 %
Neutro Abs: 2150 cells/uL (ref 1500–7800)
Neutrophils Relative %: 43 %
Platelets: 289 10*3/uL (ref 140–400)
RBC: 4.3 MIL/uL (ref 3.80–5.10)
RDW: 14.1 % (ref 11.0–15.0)
WBC: 5 10*3/uL (ref 3.8–10.8)

## 2016-12-18 LAB — TSH: TSH: 3.67 mIU/L

## 2016-12-18 MED ORDER — FLUTICASONE FUROATE-VILANTEROL 100-25 MCG/INH IN AEPB: 1.0000 | INHALATION_SPRAY | Freq: Every day | RESPIRATORY_TRACT | 11 refills | Status: DC

## 2016-12-18 MED ORDER — MELOXICAM 7.5 MG PO TABS: 7.5000 mg | ORAL_TABLET | Freq: Every day | ORAL | 0 refills | Status: DC

## 2016-12-18 NOTE — Assessment & Plan Note (Signed)
Difficulty swallowing one month ago; hx of thyroid nodule; will order thyroid US

## 2016-12-18 NOTE — Patient Instructions (Signed)
Let's get labs and ultrasound and refer you to the radiologist Start the meloxicam Let me know if you need to see the foot/ankle doctor

## 2016-12-18 NOTE — Assessment & Plan Note (Signed)
Check labs including thyroid tests

## 2016-12-18 NOTE — Progress Notes (Signed)
BP 124/74   Pulse 86   Temp 98.2 F (36.8 C) (Oral)   Resp 14   Wt 207 lb 14.4 oz (94.3 kg)   LMP 11/20/2015   SpO2 99%   Breastfeeding? No   BMI 36.83 kg/m    Subjective:    Patient ID: Catherine Berg, female    DOB: 03/01/1981, 36 y.o.   MRN: [id number]  HPI: Catherine Berg is a 36 y.o. female  Chief Complaint  Patient presents with  . Referral    thyroid; pt request not DR. solum   . Shortness of Breath    couple of weeks. Pt states it gets to the point were she has to use the inhaler or she cant get her words out     HPI  She is having some shortness of breath; not all the time; she has asthma; using QVAR Can't finish her sentences sometimes as she is talking Sometimes at night; when really resting she might notice it more  Also, no endocrinologist because endocrinologist left; would like to see her levels; feeling really hittery and palpitations; can't sleep all night; stools were on track, daily, but lately slowing down; some hair loss, some skin dryness  Would like to see a new endo; couldn't swallow some chicken about a month ago; was at church, not funny then, scary; finally got it to come back up  She has never really had a period that was out of whack; usually pretty regular; nothing different; no sex since June; not pregnant; bleeding when she went to wipe; two days ago thought her period was coming on early, then didn't bleed any more; no more bleeding since two days ago; sure the blood wasn't from urine or stool; has stopped  Depression screen [facility] 2/9 12/18/2016 09/22/2016 08/11/2016 04/22/2016 01/08/2016  Decreased Interest 0 0 1 0 0  Down, Depressed, Hopeless 0 1 1 0 1  PHQ - 2 Score 0 1 2 0 1  Altered sleeping - - 1 - -  Tired, decreased energy - - 1 - -  Change in appetite - - 0 - -  Feeling bad or failure about yourself  - - 1 - -  Trouble concentrating - - 0 - -  Moving slowly or fidgety/restless - - 1 - -  Suicidal thoughts - - 0 - -  PHQ-9 Score -  - 6 - -  Difficult doing work/chores - - Somewhat difficult - -  Some recent data might be hidden    Relevant past medical, surgical, family and social history reviewed Past Medical History:  Diagnosis Date  . Abnormal thyroid blood test   . Anxiety   . 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 Graves disease   . History of abnormal cervical Pap smear   . History of cervical polypectomy   . Hives 09/02/2015  . Hypertension   . IFG (impaired fasting glucose)   . Low serum vitamin D   . Obesity   . Pregnancy induced hypertension    with 1st pregnancy, normal pressure with 2nd  . Reflux   . Thyroid disease   . Vitamin B12 deficiency   . Vitamin D deficiency disease    Past Surgical History:  Procedure Laterality Date  . CERVICAL POLYPECTOMY    . CESAREAN SECTION     x 2  . COLON SURGERY     Family History  Problem Relation Age of Onset  . Heart attack Mother 30  .  Hypertension Mother   . Asthma Mother   . Cancer Mother        laryngeal  . Diabetes Mother        pre diabetic  . Heart disease Mother   . Mental illness Mother   . Alcohol abuse Mother   . Drug abuse Mother   . Depression Mother   . Anxiety disorder Mother   . Bipolar disorder Mother   . Learning disabilities Brother   . ADD / ADHD Brother   . Asthma Son   . Hyperlipidemia Brother   . Alcohol abuse Father   . Heart disease Maternal Grandmother        heart attack, pacemaker  . Heart attack Maternal Grandmother   . Hypertension Maternal Grandmother   . Hypertension Maternal Grandfather   . Heart disease Paternal Grandmother        couple of major open heart surgeries, leaking valves  . Hypertension Paternal Grandmother   . Hypertension Paternal Grandfather    Social History   Social History  . Marital status: Single    Spouse name: N/A  . Number of children: N/A  . Years of education: N/A   Occupational History  . Not on file.   Social History Main Topics  . Smoking  status: Never Smoker  . Smokeless tobacco: Never Used  . Alcohol use No  . Drug use: No  . Sexual activity: Yes    Birth control/ protection: None   Other Topics Concern  . Not on file   Social History Narrative  . No narrative on file    Interim medical history since last visit reviewed. Allergies and medications reviewed  Review of Systems Per HPI unless specifically indicated above     Objective:    BP 124/74   Pulse 86   Temp 98.2 F (36.8 C) (Oral)   Resp 14   Wt 207 lb 14.4 oz (94.3 kg)   LMP 11/20/2015   SpO2 99%   Breastfeeding? No   BMI 36.83 kg/m   Wt Readings from Last 3 Encounters:  12/18/16 207 lb 14.4 oz (94.3 kg)  09/22/16 208 lb 11.2 oz (94.7 kg)  08/17/16 212 lb (96.2 kg)    Physical Exam  Constitutional: She appears well-developed and well-nourished.  HENT:  Mouth/Throat: Mucous membranes are normal.  Eyes: EOM are normal. No scleral icterus.  Neck: No thyromegaly present.  Cardiovascular: Normal rate and regular rhythm.   Pulmonary/Chest: Effort normal and breath sounds normal.  Skin:  No significant hair loss; no significant recession of hair line  Psychiatric: She has a normal mood and affect. Her behavior is normal. Her mood appears not anxious. She does not exhibit a depressed mood.      Assessment & Plan:   Problem List Items Addressed This Visit      Respiratory   Allergy-induced asthma, mild intermittent, uncomplicated    Adjust asthma medicine      Relevant Medications   albuterol (PROVENTIL HFA;VENTOLIN HFA) 108 (90 Base) MCG/ACT inhaler   fluticasone furoate-vilanterol (BREO ELLIPTA) 100-25 MCG/INH AEPB   Other Relevant Orders   US THYROID   Ambulatory referral to Endocrinology     Endocrine   Thyroid nodule    Difficulty swallowing one month ago; hx of thyroid nodule; will order thyroid US      Relevant Orders   US THYROID   Ambulatory referral to Endocrinology   TSH   T4, free   T3, free   Hyperthyroidism  Check TSH, free T3 and free T4      Relevant Orders   US THYROID   Ambulatory referral to Endocrinology   TSH   T4, free   T3, free     Other   Vitamin D deficiency disease    Check level and replace if needed      Relevant Orders   VITAMIN D 25 Hydroxy (Vit-D Deficiency, Fractures)   Fatigue    Check labs including thyroid tests      Relevant Orders   CBC with Differential/Platelet   VITAMIN D 25 Hydroxy (Vit-D Deficiency, Fractures)   COMPLETE METABOLIC PANEL WITH GFR       Follow up plan: No Follow-up on file.  An after-visit summary was printed and given to the patient at check-out.  Please see the patient instructions which may contain other information and recommendations beyond what is mentioned above in the assessment and plan.  Meds ordered this encounter  Medications  . albuterol (PROVENTIL HFA;VENTOLIN HFA) 108 (90 Base) MCG/ACT inhaler    Sig: Inhale 2 puffs into the lungs every 6 (six) hours as needed for wheezing or shortness of breath.  . DISCONTD: beclomethasone (QVAR) 80 MCG/ACT inhaler    Sig: Inhale 2 puffs into the lungs once as needed.  . fluticasone furoate-vilanterol (BREO ELLIPTA) 100-25 MCG/INH AEPB    Sig: Inhale 1 puff into the lungs daily. (replaces QVAR)    Dispense:  1 each    Refill:  11  . meloxicam (MOBIC) 7.5 MG tablet    Sig: Take 1 tablet (7.5 mg total) by mouth daily. For the first 7-10 days, then just daily if needed    Dispense:  30 tablet    Refill:  0    Orders Placed This Encounter  Procedures  . US THYROID  . TSH  . T4, free  . T3, free  . CBC with Differential/Platelet  . VITAMIN D 25 Hydroxy (Vit-D Deficiency, Fractures)  . COMPLETE METABOLIC PANEL WITH GFR  . Ambulatory referral to Endocrinology

## 2016-12-18 NOTE — Assessment & Plan Note (Signed)
Check level and replace if needed 

## 2016-12-18 NOTE — Assessment & Plan Note (Signed)
Adjust asthma medicine

## 2016-12-18 NOTE — Assessment & Plan Note (Signed)
Check TSH, free T3 and free T4

## 2016-12-19 LAB — COMPLETE METABOLIC PANEL WITH GFR
ALT: 15 U/L (ref 6–29)
AST: 16 U/L (ref 10–30)
Albumin: 3.9 g/dL (ref 3.6–5.1)
Alkaline Phosphatase: 59 U/L (ref 33–115)
BUN: 15 mg/dL (ref 7–25)
CO2: 21 mmol/L (ref 20–32)
Calcium: 9.1 mg/dL (ref 8.6–10.2)
Chloride: 104 mmol/L (ref 98–110)
Creat: 0.77 mg/dL (ref 0.50–1.10)
GFR, Est African American: >89 mL/min (ref >=60)
GFR, Est Non African American: >89 mL/min (ref >=60)
Glucose, Bld: 86 mg/dL (ref 65–99)
Potassium: 4.3 mmol/L (ref 3.5–5.3)
Sodium: 137 mmol/L (ref 135–146)
Total Bilirubin: 0.4 mg/dL (ref 0.2–1.2)
Total Protein: 7.4 g/dL (ref 6.1–8.1)

## 2016-12-19 LAB — VITAMIN D 25 HYDROXY (VIT D DEFICIENCY, FRACTURES): Vit D, 25-Hydroxy: 26 ng/mL — ABNORMAL LOW (ref 30–100)

## 2016-12-30 ENCOUNTER — Telehealth: Payer: Self-pay

## 2016-12-30 NOTE — Telephone Encounter (Signed)
I called this patient to inform her that she has been scheduled to have her US thyroid on Tuesday, January 05, 2017 @ 3:[facility]pm [facility], but there was no answer. A message was left for her with this information and asking her to arrive 15 mins early.

## 2017-01-05 ENCOUNTER — Ambulatory Visit
Admission: RE | Admit: 2017-01-05 | Discharge: 2017-01-05 | Disposition: A | Discharge status: Home / Self Care | Payer: Medicaid Other | Source: Ambulatory Visit | Attending: Family Medicine | Admitting: Family Medicine

## 2017-01-05 ENCOUNTER — Encounter: Payer: Self-pay | Admitting: Family Medicine

## 2017-01-05 DIAGNOSIS — E0590 Thyrotoxicosis, unspecified without thyrotoxic crisis or storm: Secondary | ICD-10-CM | POA: Insufficient documentation

## 2017-01-05 DIAGNOSIS — E041 Nontoxic single thyroid nodule: Secondary | ICD-10-CM | POA: Diagnosis not present

## 2017-01-05 DIAGNOSIS — J4520 Mild intermittent asthma, uncomplicated: Secondary | ICD-10-CM | POA: Insufficient documentation

## 2017-01-21 ENCOUNTER — Encounter: Payer: Self-pay | Admitting: Emergency Medicine

## 2017-01-21 ENCOUNTER — Emergency Department
Admission: EM | Admit: 2017-01-21 | Discharge: 2017-01-21 | Disposition: A | Discharge status: Home / Self Care | Payer: Medicaid Other | Attending: Emergency Medicine | Admitting: Emergency Medicine

## 2017-01-21 DIAGNOSIS — Z5321 Procedure and treatment not carried out due to patient leaving prior to being seen by health care provider: Secondary | ICD-10-CM | POA: Diagnosis not present

## 2017-01-21 DIAGNOSIS — M79602 Pain in left arm: Secondary | ICD-10-CM | POA: Diagnosis present

## 2017-01-21 LAB — CBC
HCT: 36.1 % (ref 35.0–47.0)
Hemoglobin: 12.6 g/dL (ref 12.0–16.0)
MCH: 30.2 pg (ref 26.0–34.0)
MCHC: 34.9 g/dL (ref 32.0–36.0)
MCV: 86.4 fL (ref 80.0–100.0)
Platelets: 299 10*3/uL (ref 150–440)
RBC: 4.18 MIL/uL (ref 3.80–5.20)
RDW: 13.9 % (ref 11.5–14.5)
WBC: 8.1 10*3/uL (ref 3.6–11.0)

## 2017-01-21 LAB — COMPREHENSIVE METABOLIC PANEL
ALT: 19 U/L (ref 14–54)
AST: 25 U/L (ref 15–41)
Albumin: 3.8 g/dL (ref 3.5–5.0)
Alkaline Phosphatase: 56 U/L (ref 38–126)
Anion gap: 8 (ref 5–15)
BUN: 12 mg/dL (ref 6–20)
CO2: 25 mmol/L (ref 22–32)
Calcium: 9 mg/dL (ref 8.9–10.3)
Chloride: 105 mmol/L (ref 101–111)
Creatinine, Ser: 0.89 mg/dL (ref 0.44–1.00)
GFR calc Af Amer: >60 mL/min (ref >60)
GFR calc non Af Amer: >60 mL/min (ref >60)
Glucose, Bld: 100 mg/dL — ABNORMAL HIGH (ref 65–99)
Potassium: 3.4 mmol/L — ABNORMAL LOW (ref 3.5–5.1)
Sodium: 138 mmol/L (ref 135–145)
Total Bilirubin: 0.5 mg/dL (ref 0.3–1.2)
Total Protein: 7.7 g/dL (ref 6.5–8.1)

## 2017-01-21 LAB — TROPONIN I: Troponin I: <0.03 ng/mL (ref <0.03)

## 2017-01-21 NOTE — ED Triage Notes (Signed)
Pt ambulatory to triage without difficulty or distress noted; pt reports tonight having pain to left arm and shoulders with no accomp symptoms; denies any aggravating or alleviating factors; denies hx of same

## 2017-01-25 ENCOUNTER — Encounter: Payer: Self-pay | Admitting: Family Medicine

## 2017-01-25 DIAGNOSIS — R195 Other fecal abnormalities: Secondary | ICD-10-CM

## 2017-02-01 LAB — OVA AND PARASITE EXAMINATION
CONCENTRATE RESULT:: NONE SEEN
MICRO NUMBER:: 81078687
SPECIMEN QUALITY:: ADEQUATE
TRICHROME RESULT:: NONE SEEN

## 2017-02-02 ENCOUNTER — Other Ambulatory Visit: Payer: Self-pay | Admitting: Family Medicine

## 2017-02-03 ENCOUNTER — Telehealth: Payer: Self-pay | Admitting: Family Medicine

## 2017-02-03 DIAGNOSIS — L509 Urticaria, unspecified: Secondary | ICD-10-CM

## 2017-02-03 MED ORDER — HYDROXYZINE HCL 25 MG PO TABS: 25.0000 mg | ORAL_TABLET | Freq: Three times a day (TID) | ORAL | 2 refills | Status: DC | PRN

## 2017-02-03 NOTE — Assessment & Plan Note (Signed)
Refer to allergist  

## 2017-02-03 NOTE — Telephone Encounter (Signed)
She went to ER, was having hives, now coming back; Rx sent Offered referral to allergist Confirmed the med dose for OCP

## 2017-02-12 ENCOUNTER — Encounter: Payer: Self-pay | Admitting: Family Medicine

## 2017-02-12 DIAGNOSIS — R0602 Shortness of breath: Secondary | ICD-10-CM

## 2017-02-12 DIAGNOSIS — E876 Hypokalemia: Secondary | ICD-10-CM

## 2017-02-12 DIAGNOSIS — E0590 Thyrotoxicosis, unspecified without thyrotoxic crisis or storm: Secondary | ICD-10-CM

## 2017-02-12 DIAGNOSIS — R6889 Other general symptoms and signs: Secondary | ICD-10-CM

## 2017-02-22 ENCOUNTER — Ambulatory Visit: Payer: Self-pay | Admitting: Family Medicine

## 2017-03-05 ENCOUNTER — Other Ambulatory Visit: Payer: Self-pay | Admitting: Family Medicine

## 2017-03-05 MED ORDER — BUDESONIDE-FORMOTEROL FUMARATE 80-4.5 MCG/ACT IN AERO: 2.0000 | INHALATION_SPRAY | Freq: Two times a day (BID) | RESPIRATORY_TRACT | 12 refills | Status: DC

## 2017-03-05 NOTE — Progress Notes (Signed)
Insurance wouldn't cover Breo; new Rx sent

## 2017-03-26 ENCOUNTER — Telehealth: Payer: Self-pay | Admitting: Family Medicine

## 2017-03-26 DIAGNOSIS — R0602 Shortness of breath: Secondary | ICD-10-CM

## 2017-03-26 DIAGNOSIS — E876 Hypokalemia: Secondary | ICD-10-CM

## 2017-03-26 DIAGNOSIS — E0590 Thyrotoxicosis, unspecified without thyrotoxic crisis or storm: Secondary | ICD-10-CM

## 2017-03-26 DIAGNOSIS — R6889 Other general symptoms and signs: Secondary | ICD-10-CM

## 2017-03-26 NOTE — Telephone Encounter (Signed)
Called pt informed her of the information below. Pt states that she would still like to have labs done. Will reorder. Pt states that she plans to come next week.

## 2017-03-26 NOTE — Telephone Encounter (Signed)
Please remind patient of the labs that were ordered in October; those orders have now expired If she is wanting to have those done, please re-order at facility where she can go and have done soon Appointment with me if needed Thank you

## 2017-03-30 ENCOUNTER — Other Ambulatory Visit: Payer: Self-pay

## 2017-03-30 ENCOUNTER — Encounter: Payer: Self-pay | Admitting: Family Medicine

## 2017-03-30 DIAGNOSIS — E876 Hypokalemia: Secondary | ICD-10-CM

## 2017-03-30 DIAGNOSIS — R6889 Other general symptoms and signs: Secondary | ICD-10-CM

## 2017-03-30 DIAGNOSIS — R300 Dysuria: Secondary | ICD-10-CM

## 2017-03-30 DIAGNOSIS — E0590 Thyrotoxicosis, unspecified without thyrotoxic crisis or storm: Secondary | ICD-10-CM

## 2017-03-30 DIAGNOSIS — E669 Obesity, unspecified: Secondary | ICD-10-CM

## 2017-03-30 DIAGNOSIS — R0602 Shortness of breath: Secondary | ICD-10-CM

## 2017-03-31 ENCOUNTER — Encounter: Payer: Self-pay | Admitting: Emergency Medicine

## 2017-03-31 ENCOUNTER — Encounter: Payer: Self-pay | Admitting: Family Medicine

## 2017-03-31 ENCOUNTER — Other Ambulatory Visit: Payer: Self-pay | Admitting: Family Medicine

## 2017-03-31 ENCOUNTER — Emergency Department: Payer: Self-pay

## 2017-03-31 ENCOUNTER — Emergency Department
Admission: EM | Admit: 2017-03-31 | Discharge: 2017-03-31 | Disposition: A | Discharge status: Home / Self Care | Payer: Self-pay | Attending: Emergency Medicine | Admitting: Emergency Medicine

## 2017-03-31 DIAGNOSIS — Z79899 Other long term (current) drug therapy: Secondary | ICD-10-CM | POA: Insufficient documentation

## 2017-03-31 DIAGNOSIS — R0609 Other forms of dyspnea: Secondary | ICD-10-CM | POA: Insufficient documentation

## 2017-03-31 DIAGNOSIS — J45909 Unspecified asthma, uncomplicated: Secondary | ICD-10-CM | POA: Insufficient documentation

## 2017-03-31 DIAGNOSIS — Z91040 Latex allergy status: Secondary | ICD-10-CM | POA: Insufficient documentation

## 2017-03-31 DIAGNOSIS — R911 Solitary pulmonary nodule: Secondary | ICD-10-CM | POA: Insufficient documentation

## 2017-03-31 DIAGNOSIS — IMO0001 Reserved for inherently not codable concepts without codable children: Secondary | ICD-10-CM

## 2017-03-31 DIAGNOSIS — I10 Essential (primary) hypertension: Secondary | ICD-10-CM | POA: Insufficient documentation

## 2017-03-31 HISTORY — DX: Solitary pulmonary nodule: R91.1

## 2017-03-31 LAB — CBC WITH DIFFERENTIAL/PLATELET
Basophils Absolute: 0 10*3/uL (ref 0–0.1)
Basophils Relative: 1 %
Eosinophils Absolute: 0.1 10*3/uL (ref 0–0.7)
Eosinophils Relative: 2 %
HCT: 38.8 % (ref 35.0–47.0)
Hemoglobin: 13.2 g/dL (ref 12.0–16.0)
Lymphocytes Relative: 42 %
Lymphs Abs: 2 10*3/uL (ref 1.0–3.6)
MCH: 30.2 pg (ref 26.0–34.0)
MCHC: 34.1 g/dL (ref 32.0–36.0)
MCV: 88.4 fL (ref 80.0–100.0)
Monocytes Absolute: 0.3 10*3/uL (ref 0.2–0.9)
Monocytes Relative: 6 %
Neutro Abs: 2.4 10*3/uL (ref 1.4–6.5)
Neutrophils Relative %: 49 %
Platelets: 337 10*3/uL (ref 150–440)
RBC: 4.39 MIL/uL (ref 3.80–5.20)
RDW: 13.5 % (ref 11.5–14.5)
WBC: 4.9 10*3/uL (ref 3.6–11.0)

## 2017-03-31 LAB — BASIC METABOLIC PANEL
Anion gap: 9 (ref 5–15)
BUN: 9 mg/dL (ref 6–20)
CO2: 25 mmol/L (ref 22–32)
Calcium: 9.3 mg/dL (ref 8.9–10.3)
Chloride: 105 mmol/L (ref 101–111)
Creatinine, Ser: 0.84 mg/dL (ref 0.44–1.00)
GFR calc Af Amer: >60 mL/min (ref >60)
GFR calc non Af Amer: >60 mL/min (ref >60)
Glucose, Bld: 88 mg/dL (ref 65–99)
Potassium: 3.5 mmol/L (ref 3.5–5.1)
Sodium: 139 mmol/L (ref 135–145)

## 2017-03-31 LAB — T4, FREE: Free T4: 1 ng/dL (ref 0.8–1.8)

## 2017-03-31 LAB — TROPONIN I: Troponin I: <0.03 ng/mL (ref <0.03)

## 2017-03-31 LAB — TSH: TSH: 4.12 mIU/L

## 2017-03-31 LAB — BRAIN NATRIURETIC PEPTIDE: B Natriuretic Peptide: 17 pg/mL (ref 0.0–100.0)

## 2017-03-31 LAB — POTASSIUM: Potassium: 4.1 mmol/L (ref 3.5–5.3)

## 2017-03-31 LAB — FIBRIN DERIVATIVES D-DIMER (ARMC ONLY): Fibrin derivatives D-dimer (ARMC): 535.59 ng/mL (FEU) — ABNORMAL HIGH (ref 0.00–499.00)

## 2017-03-31 LAB — D-DIMER, QUANTITATIVE: D-Dimer, Quant: 1.4 mcg/mL FEU — ABNORMAL HIGH (ref <0.50)

## 2017-03-31 MED ORDER — IOPAMIDOL (ISOVUE-370) INJECTION 76%
100.0000 mL | Freq: Once | INTRAVENOUS | Status: AC | PRN
  Administered 2017-03-31: 100 mL via INTRAVENOUS
  Filled 2017-03-31: qty 100

## 2017-03-31 MED ORDER — NITROFURANTOIN MONOHYD MACRO 100 MG PO CAPS: 100.0000 mg | ORAL_CAPSULE | Freq: Two times a day (BID) | ORAL | 0 refills | Status: AC

## 2017-03-31 MED ORDER — ALBUTEROL SULFATE (2.5 MG/3ML) 0.083% IN NEBU
5.0000 mg | INHALATION_SOLUTION | Freq: Once | RESPIRATORY_TRACT | Status: AC
  Administered 2017-03-31: 5 mg via RESPIRATORY_TRACT
  Filled 2017-03-31: qty 6

## 2017-03-31 MED ORDER — PREDNISONE 20 MG PO TABS
60.0000 mg | ORAL_TABLET | Freq: Every day | ORAL | 0 refills | Status: DC
Start: 2017-03-31 — End: 2017-04-20

## 2017-03-31 NOTE — ED Provider Notes (Addendum)
[facility] Emergency Department Provider Note  ____________________________________________   I have reviewed the triage vital signs and the nursing notes.   HISTORY  Chief Complaint Shortness of Breath and Abnormal Lab    HPI Catherine Berg is a 36 y.o. female who presents today complaining of shortness of breath for several weeks. Patient has asthma, and has been only intermittently using her inhaler she thinks it does not seem to help. She does not really exertional symptoms, she states when she is talking or seeing in the choir she sometimes gets winded. She has had a minor cough but nothing significant she does not have a productive cough. She denies fever or chills. She denies any leg swelling, she has no personal or family history of CHF. She does have a grandmother who had a blood clot she states. And her primary care doctor did do a d-dimer on her which was elevated and they sent her in here for further evaluation.  She's had no recent travel no leg swelling no recent surgery and she is not on any birth control. She denies pregnancy. She is on her menses at this time. She has no chest pain.   Past Medical History:  Diagnosis Date  . Abnormal thyroid blood test   . Anxiety   . 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 Graves disease   . History of abnormal cervical Pap smear   . History of cervical polypectomy   . Hives 09/02/2015  . Hypertension   . IFG (impaired fasting glucose)   . Low serum vitamin D   . Obesity   . Pregnancy induced hypertension    with 1st pregnancy, normal pressure with 2nd  . Reflux   . Thyroid disease   . Vitamin B12 deficiency   . Vitamin D deficiency disease     Patient Active Problem List   Diagnosis Date Noted  . Abnormal first trimester screen   . Supervision of high-risk pregnancy of elderly multigravida (>= 42 years old at time of delivery), first trimester 07/22/2016  . Previous cesarean  delivery, delivered 06/04/2016  . History of preterm delivery, currently pregnant in first trimester 06/04/2016  . Advanced maternal age in multigravida, second trimester 06/04/2016  . Renal insufficiency 04/23/2016  . 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 Postcoital bleeding 03/04/2016  . Genital sore 01/08/2016  . Fatigue 12/22/2015  . Constipation 11/07/2015  . Hives 09/02/2015  . Obesity 08/15/2015  . Anxiety 07/09/2015  . 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 Allergic reaction 05/07/2015  . Depression affecting pregnancy in second trimester, antepartum 03/26/2015  . Thyroid nodule 02/01/2015  . Hyperthyroidism 12/25/2014  . Vitamin D deficiency disease   . Vitamin B12 deficiency   . History of abnormal cervical Pap smear   . Allergy-induced asthma, mild intermittent, uncomplicated 10/09/2013    Past Surgical History:  Procedure Laterality Date  . CERVICAL POLYPECTOMY    . CESAREAN SECTION     x 2    Prior to Admission medications   Medication Sig Start Date End Date Taking? Authorizing Provider  albuterol (PROVENTIL HFA;VENTOLIN HFA) 108 (90 Base) MCG/ACT inhaler Inhale 2 puffs into the lungs every 6 (six) hours as needed for wheezing or shortness of breath.    [provider]  budesonide-formoterol (SYMBICORT) 80-4.5 MCG/ACT inhaler Inhale 2 puffs into the lungs 2 (two) times daily. This replaces Breo 03/05/17   Lada, Janit Bern, MD  fluticasone (FLONASE) 50 MCG/ACT nasal spray Place 2 sprays  into both nostrils daily. 09/22/16   Kerman Passey, MD  hydrOXYzine (ATARAX/VISTARIL) 25 MG tablet Take 1 tablet (25 mg total) by mouth 3 (three) times daily as needed. 02/03/17   Lada, Janit Bern, MD  levothyroxine (SYNTHROID, LEVOTHROID) 88 MCG tablet Take 88 mcg by mouth daily.  07/07/16 07/07/17  [provider]  meloxicam (MOBIC) 7.5 MG tablet Take 1 tablet (7.5 mg total) by mouth daily. For the first 7-10 days, then just daily if needed 12/18/16   Lada, Janit Bern, MD   nitrofurantoin, macrocrystal-monohydrate, (MACROBID) 100 MG capsule Take 1 capsule (100 mg total) by mouth 2 (two) times daily for 5 days. 03/31/17 04/05/17  Kerman Passey, MD  Norethindrone Acetate-Ethinyl Estradiol (MICROGESTIN) 1.5-30 MG-MCG tablet Take 1 tablet by mouth daily. For 21 days, then off for 7 days 02/03/17   Kerman Passey, MD  sertraline (ZOLOFT) 50 MG tablet Take 1 tablet (50 mg total) by mouth daily. 08/14/16   Hildred Laser, MD    Allergies Latex and Shellfish allergy  Family History  Problem Relation Age of Onset  . Heart attack Mother 70  . Hypertension Mother   . Asthma Mother   . Cancer Mother        laryngeal  . Diabetes Mother        pre diabetic  . Heart disease Mother   . Mental illness Mother   . Alcohol abuse Mother   . Drug abuse Mother   . Depression Mother   . Anxiety disorder Mother   . Bipolar disorder Mother   . Learning disabilities Brother   . ADD / ADHD Brother   . Asthma Son   . Hyperlipidemia Brother   . Alcohol abuse Father   . Heart disease Maternal Grandmother        heart attack, pacemaker  . Heart attack Maternal Grandmother   . Hypertension Maternal Grandmother   . Hypertension Maternal Grandfather   . Heart disease Paternal Grandmother        couple of major open heart surgeries, leaking valves  . Hypertension Paternal Grandmother   . Hypertension Paternal Grandfather     Social History Social History   Tobacco Use  . Smoking status: Never Smoker  . Smokeless tobacco: Never Used  Substance Use Topics  . Alcohol use: No    Alcohol/week: 0.0 oz  . Drug use: No    Review of Systems Constitutional: No fever/chills Eyes: No visual changes. ENT: No sore throat. No stiff neck no neck pain Cardiovascular: Denies chest pain. Respiratory: Denies shortness of breath. Gastrointestinal:   no vomiting.  No diarrhea.  No constipation. Genitourinary: Negative for dysuria. Musculoskeletal: Negative lower extremity  swelling Skin: Negative for rash. Neurological: Negative for severe headaches, focal weakness or numbness.   ____________________________________________   PHYSICAL EXAM:  VITAL SIGNS: ED Triage Vitals  Enc Vitals Group     BP 03/31/17 1250 (!) 153/125     Pulse Rate 03/31/17 1250 72     Resp 03/31/17 1250 18     Temp 03/31/17 1250 98.7 F (37.1 C)     Temp Source 03/31/17 1250 Oral     SpO2 03/31/17 1250 100 %     Weight 03/31/17 1254 212 lb (96.2 kg)     Height 03/31/17 1254 5\' 3"  (1.6 m)     Head Circumference --      Peak Flow --      Pain Score 03/31/17 1254 0     Pain Loc --  Pain Edu? --      Excl. in GC? --     Constitutional: Alert and oriented. Well appearing and in no acute distress. Eyes: Conjunctivae are normal Head: Atraumatic HEENT: No congestion/rhinnorhea. Mucous membranes are moist.  Oropharynx non-erythematous Neck:   Nontender with no meningismus, no masses, no stridor Cardiovascular: Normal rate, regular rhythm. Grossly normal heart sounds.  Good peripheral circulation. Respiratory: Normal respiratory effort.  No retractions. Lungs CTAB. Abdominal: Soft and nontender. No distention. No guarding no rebound Back:  There is no focal tenderness or step off.  there is no midline tenderness there are no lesions noted. there is no CVA tenderness Musculoskeletal: No lower extremity tenderness, no upper extremity tenderness. No joint effusions, no DVT signs strong distal pulses no edema Neurologic:  Normal speech and language. No gross focal neurologic deficits are appreciated.  Skin:  Skin is warm, dry and intact. No rash noted. Psychiatric: Mood and affect are normal. Speech and behavior are normal.  ____________________________________________   LABS (all labs ordered are listed, but only abnormal results are displayed)  Labs Reviewed  FIBRIN DERIVATIVES D-DIMER ([facility] ONLY) - Abnormal; Notable for the following components:      Result Value    Fibrin derivatives D-dimer ([facility]) 535.59 (*)    All other components within normal limits  CBC WITH DIFFERENTIAL/PLATELET  BASIC METABOLIC PANEL  BRAIN NATRIURETIC PEPTIDE  TROPONIN I    Pertinent labs  results that were available during my care of the patient were reviewed by me and considered in my medical decision making (see chart for details). ____________________________________________  EKG  I personally interpreted any EKGs ordered by me or triage Sinus rhythm at 64 bpm normal axis no acute ST elevation or depression ____________________________________________  RADIOLOGY  Pertinent labs & imaging results that were available during my care of the patient were reviewed by me and considered in my medical decision making (see chart for details). If possible, patient and/or family made aware of any abnormal findings.  Dg Chest 2 View  Result Date: 03/31/2017 CLINICAL DATA:  Shortness of breath. EXAM: CHEST  2 VIEW COMPARISON:  Radiograph of Sep 25, 2015. FINDINGS: The heart size and mediastinal contours are within normal limits. Both lungs are clear. No pneumothorax or pleural effusion is noted. The visualized skeletal structures are unremarkable. IMPRESSION: No active cardiopulmonary disease. Electronically Signed   By: Lupita RaiderJames  Green Jr, M.D.   On: 03/31/2017 14:20   ____________________________________________    PROCEDURES  Procedure(s) performed: None  Procedures  Critical Care performed: None  ____________________________________________   INITIAL IMPRESSION / ASSESSMENT AND PLAN / ED COURSE  Pertinent labs & imaging results that were available during my care of the patient were reviewed by me and considered in my medical decision making (see chart for details).  Patient with poorly described mild shortness of breath, with a history of asthma. Symptoms for several weeks. D-dimer positive at an outpatient and repeated here. BNP is negative no evidence of failure  troponins negative EKG is reassuring no evidence of ACS PE or dissection low suspicion for cardiomyopathy. Most likely this is asthmatic in origin, we will give her albuterol, she is slightly diminished in the bases, however, given patient's positive d-dimer and remote family history of PE we'll obtain a CT scan. She has no allergy to contrast.  ----------------------------------------- 3:37 PM on 03/31/2017 -----------------------------------------  Patient with tiny lung nodule which does not necessarily require follow-up and nonetheless I have told her about it and instructed her to  talk to her primary care doctor about it. No evidence of acute cardiac pulmonary disease today nothing to suggest Kari myopathy, myocarditis endocarditis pericarditis pneumonia and pneumothorax PE, COPD or other acute pathologies the lungs or heart causing the patient to have some shortness of breath. Most likely this is her asthma. We will send her home with albuterol actions that she does have albuterol and I have also written her for steroids for a short taper. In addition, patient does have a history of reflux disease and advised her to take her antiacids as this certainly could be contributing. Sometimes she does have acid indigestion in the night but not often. Return precautions and follow-up given and understood.    ____________________________________________   FINAL CLINICAL IMPRESSION(S) / ED DIAGNOSES  Final diagnoses:  None      This chart was dictated using voice recognition software.  Despite best efforts to proofread,  errors can occur which can change meaning.      Jeanmarie PlantMcShane, Callie Bunyard A, MD 03/31/17 1510    Jeanmarie PlantMcShane, Nitish Roes A, MD 03/31/17 1538    Jeanmarie PlantMcShane, Doloras Tellado A, MD 03/31/17 1539

## 2017-03-31 NOTE — ED Triage Notes (Addendum)
Patient presents to the ED with shortness of breath x 2 weeks.  Patient states, "sometimes I don't notice it, but then it comes back."  Patient went to her doctor for outpatient labs yesterday and her d-dimer came back elevated so doctor sent patient to the ED.  Patient denies chest pain.

## 2017-03-31 NOTE — ED Notes (Signed)
Patient transported to X-ray 

## 2017-03-31 NOTE — Discharge Instructions (Signed)
Return to the emergency room. No worrisome symptoms including shortness of breath or chest pain. Follow closely with your doctor. There is a tiny lung nodule that we noticed that may need outpatient follow-up please make her doctor aware and they will review the findings. Use albuterol every 6 hours at home for the next few days, and take the steroids until they're gone.

## 2017-03-31 NOTE — Progress Notes (Signed)
Start macrobid, culture pending

## 2017-03-31 NOTE — ED Triage Notes (Signed)
First Nurse Note:  Arrives to ED for evaluation due to elevated D dimer from PCP yesterday.  Patient states she has had a several week history of SOB and had lab work done yesterday outpatient.    Patient is AAOx3  Skin warm and dry. No SOB/ DOE noted.

## 2017-04-01 ENCOUNTER — Encounter: Payer: Self-pay | Admitting: Family Medicine

## 2017-04-01 LAB — URINALYSIS W MICROSCOPIC + REFLEX CULTURE
Bacteria, UA: NONE SEEN /HPF
Bilirubin Urine: NEGATIVE
Glucose, UA: NEGATIVE
Hyaline Cast: NONE SEEN /LPF
Ketones, ur: NEGATIVE
Nitrites, Initial: NEGATIVE
Protein, ur: NEGATIVE
Specific Gravity, Urine: 1.014 (ref 1.001–1.03)
pH: 6 (ref 5.0–8.0)

## 2017-04-01 LAB — URINE CULTURE
MICRO NUMBER:: 81336111
SPECIMEN QUALITY:: ADEQUATE

## 2017-04-01 LAB — CULTURE INDICATED

## 2017-04-06 ENCOUNTER — Encounter: Payer: Self-pay | Admitting: Family Medicine

## 2017-04-06 MED ORDER — FLUCONAZOLE 150 MG PO TABS: 150.0000 mg | ORAL_TABLET | Freq: Once | ORAL | 0 refills | Status: AC

## 2017-04-09 ENCOUNTER — Inpatient Hospital Stay: Payer: Self-pay | Admitting: Family Medicine

## 2017-04-14 ENCOUNTER — Encounter: Payer: Self-pay | Admitting: Family Medicine

## 2017-04-20 ENCOUNTER — Ambulatory Visit
Admission: RE | Admit: 2017-04-20 | Discharge: 2017-04-20 | Disposition: A | Discharge status: Home / Self Care | Payer: Self-pay | Source: Ambulatory Visit | Attending: Family Medicine | Admitting: Family Medicine

## 2017-04-20 ENCOUNTER — Encounter: Payer: Self-pay | Admitting: Family Medicine

## 2017-04-20 ENCOUNTER — Telehealth: Payer: Self-pay | Admitting: Family Medicine

## 2017-04-20 ENCOUNTER — Ambulatory Visit (INDEPENDENT_AMBULATORY_CARE_PROVIDER_SITE_OTHER): Payer: Medicaid Other | Admitting: Family Medicine

## 2017-04-20 VITALS — BP 122/82 | HR 81 | Temp 98.4°F | Resp 14 | Wt 213.5 lb

## 2017-04-20 DIAGNOSIS — R7989 Other specified abnormal findings of blood chemistry: Secondary | ICD-10-CM | POA: Insufficient documentation

## 2017-04-20 DIAGNOSIS — R600 Localized edema: Secondary | ICD-10-CM | POA: Insufficient documentation

## 2017-04-20 DIAGNOSIS — R0609 Other forms of dyspnea: Secondary | ICD-10-CM | POA: Insufficient documentation

## 2017-04-20 DIAGNOSIS — R0600 Dyspnea, unspecified: Secondary | ICD-10-CM

## 2017-04-20 DIAGNOSIS — R911 Solitary pulmonary nodule: Secondary | ICD-10-CM

## 2017-04-20 MED ORDER — MEDICAL COMPRESSION STOCKINGS MISC: 2 refills | Status: DC

## 2017-04-20 NOTE — Telephone Encounter (Signed)
Catherine Berg called to say NEGATIVE ultrasound, no dvt Please call patient with good news We know now that the D-dimer did not represent clot in lungs or legs

## 2017-04-20 NOTE — Telephone Encounter (Signed)
Called pt informed her of the information below. Pt gave verbal understanding.  

## 2017-04-20 NOTE — Patient Instructions (Addendum)
We'll have you see the lung doctor If you have not heard anything from my staff in a week about any orders/referrals/studies from today, please contact us here to follow-up [phone] Compression stockings from morning until night We'll get the ultrasounds today of your legs

## 2017-04-20 NOTE — Assessment & Plan Note (Signed)
Start compression stockings; consider varicose veins or venous insufficiency

## 2017-04-20 NOTE — Progress Notes (Signed)
BP 122/82   Pulse 81   Temp 98.4 F (36.9 C) (Oral)   Resp 14   Wt 213 lb 8 oz (96.8 kg)   LMP 03/31/2017 (Exact Date)   SpO2 99%   BMI 37.82 kg/m    Subjective:    Patient ID: Catherine Berg, female    DOB: June 17, 1980, 36 y.o.   MRN: [id number]  HPI: [name] is a 36 y.o. female  Chief Complaint  Patient presents with  . abnormal labs    review and see if further testing needs to be done  . Edema    bilateral legs    HPI She is upset, crying today with the nurse; here to follow-up on several things She has bilateral leg edema She was in the ER with dyspnea on 03/31/17; she had a positive D-dimer and was sent to the ER for evaluation Her BP in the ER was 153/125; recheck lab there showed fibrin derivative elevated at 535.59 Normal BNP and troponin I; normal H/H; normal creatinine Chest CT done which showed tiny nodule ER doctor thought indigestion was possibly the cause Echo was done in 2017 and she had a normal LVEF, 55-65% She has asthma and knows what that feels like; dust triggers; this is not that  Sometimes her heart beat is off; if it does multiple beats, she'll feel it then Sometimes she doesn't have the air in her She has been singing in the choir; now so out of wind, it is unreal When she finishes singing, she has step away and go outside, that is not normal for her She talks and runs out of air I asked about exposures to toxins, chemicals; uses a grill cleaner at work; a lot got inhaled in this summer; they have ventilation system and it kicks back; she tries to limit chemicals at home Not aware of anyone else at work affected Saw a lung doctor but it was years ago No skeletal issues on CT No one in the family with interstitial lung disease; mother has COPD; patient's mother smoked growing up Can happen at rest but worse with exertion She does take birth control; grandmother had blood clot in her lung, moved and caused massive heart attack; survived  that; having bilateral leg edema  IMPRESSION: No evidence of pulmonary embolus.  4 mm right lower lobe ground-glass subpleural nodule. No follow-up needed if patient is low-risk. Non-contrast chest CT can be considered in 12 months if patient is high-risk. This recommendation follows the consensus statement: Guidelines for Management of Incidental Pulmonary Nodules Detected on CT Images: From the Fleischner Society 2017; Radiology 2017; 284:228-243.   Electronically Signed   By: Ted Mcalpine M.D.   On: 03/31/2017 15:26  This started a few months ago She was doing well summer before last; was walking and lost a lost of weight Comes and goes She has hyperthyroidism Lab Results  Component Value Date   TSH 4.12 03/30/2017    Depression screen [facility] 2/9 04/20/2017 12/18/2016 09/22/2016 08/11/2016 04/22/2016  Decreased Interest 0 0 0 1 0  Down, Depressed, Hopeless 1 0 1 1 0  PHQ - 2 Score 1 0 1 2 0  Altered sleeping - - - 1 -  Tired, decreased energy - - - 1 -  Change in appetite - - - 0 -  Feeling bad or failure about yourself  - - - 1 -  Trouble concentrating - - - 0 -  Moving slowly or fidgety/restless - - -  1 -  Suicidal thoughts - - - 0 -  PHQ-9 Score - - - 6 -  Difficult doing work/chores - - - Somewhat difficult -  Some recent data might be hidden   Relevant past medical, surgical, family and social history reviewed Past Medical History:  Diagnosis Date  . Abnormal thyroid blood test   . Anxiety   . 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 Graves disease   . History of abnormal cervical Pap smear   . History of cervical polypectomy   . Hives 09/02/2015  . Hypertension   . IFG (impaired fasting glucose)   . Incidental lung nodule, > 3mm and < 8mm 03/31/2017   4 mm RLL lung nodule on chest CT Mar 31, 2017  . Low serum vitamin D   . Obesity   . Pregnancy induced hypertension    with 1st pregnancy, normal pressure with 2nd  . Reflux   .  Thyroid disease   . Vitamin B12 deficiency   . Vitamin D deficiency disease    Past Surgical History:  Procedure Laterality Date  . CERVICAL POLYPECTOMY    . CESAREAN SECTION     x 2   Family History  Problem Relation Age of Onset  . Heart attack Mother 52  . Hypertension Mother   . Asthma Mother   . Cancer Mother        laryngeal  . Diabetes Mother        pre diabetic  . Heart disease Mother   . Mental illness Mother   . Alcohol abuse Mother   . Drug abuse Mother   . Depression Mother   . Anxiety disorder Mother   . Bipolar disorder Mother   . Learning disabilities Brother   . ADD / ADHD Brother   . Asthma Son   . Hyperlipidemia Brother   . Alcohol abuse Father   . Heart disease Maternal Grandmother        heart attack, pacemaker  . Heart attack Maternal Grandmother   . Hypertension Maternal Grandmother   . Hypertension Maternal Grandfather   . Heart disease Paternal Grandmother        couple of major open heart surgeries, leaking valves  . Hypertension Paternal Grandmother   . Hypertension Paternal Grandfather    Social History   Tobacco Use  . Smoking status: Never Smoker  . Smokeless tobacco: Never Used  Substance Use Topics  . Alcohol use: No    Alcohol/week: 0.0 oz  . Drug use: No    Interim medical history since last visit reviewed. Allergies and medications reviewed  Review of Systems Per HPI unless specifically indicated above     Objective:    BP 122/82   Pulse 81   Temp 98.4 F (36.9 C) (Oral)   Resp 14   Wt 213 lb 8 oz (96.8 kg)   LMP 03/31/2017 (Exact Date)   SpO2 99%   BMI 37.82 kg/m   Wt Readings from Last 3 Encounters:  04/20/17 213 lb 8 oz (96.8 kg)  03/31/17 212 lb (96.2 kg)  01/21/17 207 lb (93.9 kg)    Physical Exam  Constitutional: She appears well-developed and well-nourished.  HENT:  Mouth/Throat: Mucous membranes are normal.  Eyes: EOM are normal. No scleral icterus.  Neck: No JVD present. No thyromegaly present.   Cardiovascular: Normal rate and regular rhythm.  Pulmonary/Chest: Effort normal and breath sounds normal. She has no decreased breath sounds. She has no wheezes.  She has no rales.  Musculoskeletal:  Tenderness in the posterior RIGHT proximal calf; no erythema; no increased warmth of either lower leg  Skin:  No significant hair loss; no significant recession of hair line  Psychiatric: She has a normal mood and affect. Her behavior is normal. Her mood appears not anxious. She expresses no homicidal and no suicidal ideation.  Briefly tearful; good eye contact with examiner; forward-thinking   Results for orders placed or performed during the hospital encounter of 03/31/17  CBC with Differential  Result Value Ref Range   WBC 4.9 3.6 - 11.0 K/uL   RBC 4.39 3.80 - 5.20 MIL/uL   Hemoglobin 13.2 12.0 - 16.0 g/dL   HCT 16.138.8 09.635.0 - 04.547.0 %   MCV 88.4 80.0 - 100.0 fL   MCH 30.2 26.0 - 34.0 pg   MCHC 34.1 32.0 - 36.0 g/dL   RDW 40.913.5 81.111.5 - 91.414.5 %   Platelets 337 150 - 440 K/uL   Neutrophils Relative % 49 %   Neutro Abs 2.4 1.4 - 6.5 K/uL   Lymphocytes Relative 42 %   Lymphs Abs 2.0 1.0 - 3.6 K/uL   Monocytes Relative 6 %   Monocytes Absolute 0.3 0.2 - 0.9 K/uL   Eosinophils Relative 2 %   Eosinophils Absolute 0.1 0 - 0.7 K/uL   Basophils Relative 1 %   Basophils Absolute 0.0 0 - 0.1 K/uL  Basic metabolic panel  Result Value Ref Range   Sodium 139 135 - 145 mmol/L   Potassium 3.5 3.5 - 5.1 mmol/L   Chloride 105 101 - 111 mmol/L   CO2 25 22 - 32 mmol/L   Glucose, Bld 88 65 - 99 mg/dL   BUN 9 6 - 20 mg/dL   Creatinine, Ser 7.820.84 0.44 - 1.00 mg/dL   Calcium 9.3 8.9 - 95.610.3 mg/dL   GFR calc non Af Amer >60 >60 mL/min   GFR calc Af Amer >60 >60 mL/min   Anion gap 9 5 - 15  Fibrin derivatives D-Dimer ([facility] only)  Result Value Ref Range   Fibrin derivatives D-dimer ([facility]) 535.59 (H) 0.00 - 499.00 ng/mL (FEU)  Brain natriuretic peptide  Result Value Ref Range   B Natriuretic Peptide 17.0  0.0 - 100.0 pg/mL  Troponin I  Result Value Ref Range   Troponin I <0.03 <0.03 ng/mL      Assessment & Plan:   Problem List Items Addressed This Visit      Other   Bilateral leg edema    Start compression stockings; consider varicose veins or venous insufficiency      Relevant Orders   US Venous Img Lower Bilateral (Completed)   Incidental lung nodule, > 3mm and < 8mm    Discussed with patient; low risk       Other Visit Diagnoses    Dyspnea on exertion    -  Primary   persistent; discussed ddx, what ER work-up ruled out, what it didn't; she does not believe this is asthma; next step is referral to pulm   Relevant Orders   Ambulatory referral to Pulmonology   US Venous Img Lower Bilateral (Completed)   D-dimer, elevated       chest CT PE protocol done in ER, but not venous dopplers; patient has discomfort in calf; will get venous dopplers to r/o DVT   Relevant Orders   US Venous Img Lower Bilateral (Completed)      Supportive listening; mood discussed  Follow up plan: No Follow-up  on file.  An after-visit summary was printed and given to the patient at check-out.  Please see the patient instructions which may contain other information and recommendations beyond what is mentioned above in the assessment and plan.  Meds ordered this encounter  Medications  . Elastic Bandages & Supports (MEDICAL COMPRESSION STOCKINGS) MISC    Sig: Wear from morning to night, but do not sleep in these    Dispense:  1 each    Refill:  2    Orders Placed This Encounter  Procedures  . US Venous Img Lower Bilateral  . Ambulatory referral to Pulmonology

## 2017-04-21 NOTE — Assessment & Plan Note (Signed)
Discussed with patient; low risk

## 2017-05-10 ENCOUNTER — Ambulatory Visit: Payer: Medicaid Other | Admitting: Cardiovascular Disease

## 2017-05-10 NOTE — Progress Notes (Deleted)
Patient was seen by Dr. Meredeth IdeFleming as her pulmonologist in 2015, at that time he was noted that she had asthma, she was treated with Advair and Singulair, and alpha-1 antitrypsin was ordered, however she did not follow-up.  **CT chest imaging personally reviewed 03/31/17 reduced lung volumes bilaterally due to morbid obesity.  4 mm subpleural nodule in the right lower lobe. **CBC 03/31/17; eosinophils equals 100

## 2017-05-11 ENCOUNTER — Ambulatory Visit: Payer: Medicaid Other | Admitting: Cardiovascular Disease

## 2017-05-11 ENCOUNTER — Institutional Professional Consult (permissible substitution): Payer: Self-pay | Admitting: Internal Medicine

## 2017-05-12 ENCOUNTER — Encounter: Payer: Self-pay | Admitting: Internal Medicine

## 2017-05-12 ENCOUNTER — Encounter: Payer: Self-pay | Admitting: Cardiovascular Disease

## 2017-06-28 ENCOUNTER — Encounter: Payer: Self-pay | Admitting: Family Medicine

## 2017-06-28 DIAGNOSIS — R300 Dysuria: Secondary | ICD-10-CM

## 2017-08-03 ENCOUNTER — Ambulatory Visit (INDEPENDENT_AMBULATORY_CARE_PROVIDER_SITE_OTHER): Payer: Medicaid Other | Admitting: Family Medicine

## 2017-08-03 ENCOUNTER — Encounter: Payer: Self-pay | Admitting: Family Medicine

## 2017-08-03 VITALS — BP 132/78 | HR 88 | Temp 98.3°F | Resp 14 | Ht 62.5 in | Wt 216.4 lb

## 2017-08-03 DIAGNOSIS — Z6838 Body mass index (BMI) 38.0-38.9, adult: Secondary | ICD-10-CM | POA: Diagnosis not present

## 2017-08-03 DIAGNOSIS — E0590 Thyrotoxicosis, unspecified without thyrotoxic crisis or storm: Secondary | ICD-10-CM

## 2017-08-03 DIAGNOSIS — Z0000 Encounter for general adult medical examination without abnormal findings: Secondary | ICD-10-CM | POA: Insufficient documentation

## 2017-08-03 DIAGNOSIS — Z113 Encounter for screening for infections with a predominantly sexual mode of transmission: Secondary | ICD-10-CM | POA: Diagnosis not present

## 2017-08-03 DIAGNOSIS — E559 Vitamin D deficiency, unspecified: Secondary | ICD-10-CM

## 2017-08-03 DIAGNOSIS — E538 Deficiency of other specified B group vitamins: Secondary | ICD-10-CM

## 2017-08-03 DIAGNOSIS — E6609 Other obesity due to excess calories: Secondary | ICD-10-CM

## 2017-08-03 DIAGNOSIS — M79604 Pain in right leg: Secondary | ICD-10-CM

## 2017-08-03 DIAGNOSIS — E041 Nontoxic single thyroid nodule: Secondary | ICD-10-CM

## 2017-08-03 DIAGNOSIS — Z23 Encounter for immunization: Secondary | ICD-10-CM

## 2017-08-03 NOTE — Assessment & Plan Note (Signed)
Check level today; fatigue

## 2017-08-03 NOTE — Progress Notes (Signed)
Patient ID: Catherine Berg, female   DOB: October 15, 1980, 37 y.o.   MRN: [id number]   Subjective:   Catherine Berg is a 37 y.o. female here for a complete physical exam  Interim issues since last visit: no medical excitement She fell, legs went both ways; no head injury Trouble laying on the right side prox hamstring on the lateral side; no bruising; not trying ice or heat; definitely better than it was; could feel it when it walked; just shifts to the opposite side Has hx of thyroid issues; was seeing endo; has not been there in a while; does not want to see Dr. Gabriel Carina; would like referral to another provider; Dr. Graceann Congress left  USPSTF grade A and B recommendations Depression:  Depression screen [facility] 2/9 08/03/2017 04/20/2017 12/18/2016 09/22/2016 08/11/2016  Decreased Interest 0 0 0 0 1  Down, Depressed, Hopeless 0 1 0 1 1  PHQ - 2 Score 0 1 0 1 2  Altered sleeping - - - - 1  Tired, decreased energy - - - - 1  Change in appetite - - - - 0  Feeling bad or failure about yourself  - - - - 1  Trouble concentrating - - - - 0  Moving slowly or fidgety/restless - - - - 1  Suicidal thoughts - - - - 0  PHQ-9 Score - - - - 6  Difficult doing work/chores - - - - Somewhat difficult  Some recent data might be hidden   Hypertension: BP Readings from Last 3 Encounters:  08/03/17 132/78  04/20/17 122/82  03/31/17 136/76   Obesity: weight is coming back gradually; thyroid to be checked today; not drinking enough water; saw nutritinoist already Wt Readings from Last 3 Encounters:  08/03/17 216 lb 6.4 oz (98.2 kg)  04/20/17 213 lb 8 oz (96.8 kg)  03/31/17 212 lb (96.2 kg)   BMI Readings from Last 3 Encounters:  08/03/17 38.95 kg/m  04/20/17 37.82 kg/m  03/31/17 37.55 kg/m    Skin cancer: skin tag left side neck Lung cancer:  nonsmoker Breast cancer: no lumps or bumps; had mammo prior to 44, start yearly at 68 Colorectal cancer: grandfather died from colon cancer, probably 34 or so, no one  else in the family Cervical cancer screening: UTD BRCA gene screening: family hx of breast and/or ovarian cancer and/or metastatic prostate cancer? None known; grandmother had cervical cancer HIV, hep B, hep C: can do STD testing and prevention (chl/gon/syphilis): can do Intimate partner violence: no abuse Contraception: implanon Osteoporosis: n/a Fall prevention/vitamin D: discussed Immunizations: tetanus due in six days Diet: some calcium, try tums for no calcium meals Exercise: nothing regular, but does work as a Patent attorney for other person, probably walking 4 hours a day; [employer] Alcohol: no  Tobacco use: nonsmoker AAA: n/a Aspirin:n/a Glucose: 88 in November 2018 Glucose  Date Value Ref Range Status  08/13/2012 101 (H) 65 - 99 mg/dL Final   Glucose, Bld  Date Value Ref Range Status  03/31/2017 88 65 - 99 mg/dL Final  01/21/2017 100 (H) 65 - 99 mg/dL Final  12/18/2016 86 65 - 99 mg/dL Final   Lipids: not in the system; trying to limit fatty meats No results found for: CHOL No results found for: HDL No results found for: LDLCALC No results found for: TRIG No results found for: CHOLHDL No results found for: LDLDIRECT   Past Medical History:  Diagnosis Date  . Abnormal thyroid blood test   . Anxiety   .  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 Graves disease   . History of abnormal cervical Pap smear   . History of cervical polypectomy   . Hives 09/02/2015  . Hypertension   . IFG (impaired fasting glucose)   . Incidental lung nodule, > 57m and < 868m11/28/2018   4 mm RLL lung nodule on chest CT Mar 31, 2017  . Low serum vitamin D   . Obesity   . Pregnancy induced hypertension    with 1st pregnancy, normal pressure with 2nd  . Reflux   . Thyroid disease   . Vitamin B12 deficiency   . Vitamin D deficiency disease    Past Surgical History:  Procedure Laterality Date  . CERVICAL POLYPECTOMY    . CESAREAN SECTION     x 2   Family  History  Problem Relation Age of Onset  . Heart attack Mother [id number]. Hypertension Mother   . Asthma Mother   . Cancer Mother        laryngeal  . Diabetes Mother        pre diabetic  . Heart disease Mother   . Mental illness Mother   . Alcohol abuse Mother   . Drug abuse Mother   . Depression Mother   . Anxiety disorder Mother   . Bipolar disorder Mother   . Learning disabilities Brother   . ADD / ADHD Brother   . Asthma Son   . Hyperlipidemia Brother   . Alcohol abuse Father   . Heart disease Maternal Grandmother        heart attack, pacemaker  . Heart attack Maternal Grandmother   . Hypertension Maternal Grandmother   . Hypertension Maternal Grandfather   . Heart disease Paternal Grandmother        couple of major open heart surgeries, leaking valves  . Hypertension Paternal Grandmother   . Hypertension Paternal Grandfather    Social History   Tobacco Use  . Smoking status: Never Smoker  . Smokeless tobacco: Never Used  Substance Use Topics  . Alcohol use: No    Alcohol/week: 0.0 oz  . Drug use: No   Review of Systems  Constitutional: Negative for unexpected weight change.  HENT: Negative for nosebleeds.   Eyes:       Eyes have been bloodshot lately; no mattering; no loss of vision; no foreign body sensation; wears glasses only  Respiratory: Positive for shortness of breath.   Cardiovascular: Positive for chest pain ("sometimes", feels like chest tightens up; [facility]  Gastrointestinal: Negative for blood in stool.  Endocrine: Negative for polydipsia.  Genitourinary: Negative for hematuria.  Musculoskeletal: Positive for arthralgias (joints hurt, hips; back hurts after sitting for a while, stiff getting out of the car). Negative for joint swelling.  Skin:       Skin tag left side neck  Hematological: Does not bruise/bleed easily.  Psychiatric/Behavioral: Negative for dysphoric mood.    Objective:   Vitals:   08/03/17 1106  BP:  132/78  Pulse: 88  Resp: 14  Temp: 98.3 F (36.8 C)  TempSrc: Oral  SpO2: 98%  Weight: 216 lb 6.4 oz (98.2 kg)  Height: 5' 2.5" (1.588 m)   Body mass index is 38.95 kg/m. Wt Readings from Last 3 Encounters:  08/03/17 216 lb 6.4 oz (98.2 kg)  04/20/17 213 lb 8 oz (96.8 kg)  03/31/17 212 lb (96.2 kg)   Physical Exam  Constitutional: She  appears well-developed and well-nourished.  HENT:  Head: Normocephalic and atraumatic.  Right Ear: Hearing, tympanic membrane, external ear and ear canal normal.  Left Ear: Hearing, tympanic membrane, external ear and ear canal normal.  Eyes: Conjunctivae and EOM are normal. Right eye exhibits no hordeolum. Left eye exhibits no hordeolum. No scleral icterus.  Neck: Carotid bruit is not present. Thyromegaly (mild, right side more than left) present.  Cardiovascular: Normal rate, regular rhythm, S1 normal, S2 normal and normal heart sounds.  No extrasystoles are present.  Pulmonary/Chest: Effort normal and breath sounds normal. No respiratory distress. Right breast exhibits no inverted nipple, no mass, no nipple discharge, no skin change and no tenderness. Left breast exhibits no inverted nipple, no mass, no nipple discharge, no skin change and no tenderness. Breasts are symmetrical.  Abdominal: Soft. Normal appearance and bowel sounds are normal. She exhibits no distension, no abdominal bruit, no pulsatile midline mass and no mass. There is no hepatosplenomegaly. There is no tenderness. No hernia.  Musculoskeletal: She exhibits no edema.       Legs: Tenderness over superior posterior RIGHT thigh  Lymphadenopathy:       Head (right side): No submandibular adenopathy present.       Head (left side): No submandibular adenopathy present.    She has no cervical adenopathy.    She has no axillary adenopathy.  Neurological: She is alert. She displays no tremor. No cranial nerve deficit. She exhibits normal muscle tone. Gait normal.  Reflex Scores:       Patellar reflexes are 2+ on the right side and 2+ on the left side. Skin: Skin is warm and dry. No bruising and no ecchymosis noted. No cyanosis. No pallor.  Psychiatric: Her speech is normal and behavior is normal. Thought content normal. Her mood appears not anxious. She does not exhibit a depressed mood.    Assessment/Plan:   Problem List Items Addressed This Visit      Endocrine   Thyroid nodule    Refer to new endocrinologist      Hyperthyroidism    Refer to new endocrinologist      Relevant Orders   T4, free     Other   Vitamin D deficiency disease    Check level today      Relevant Orders   VITAMIN D 25 Hydroxy (Vit-D Deficiency, Fractures)   Vitamin B12 deficiency    Check level today; fatigue      Relevant Orders   B12   Screen for STD (sexually transmitted disease)    Condom use; implanon; labs      Relevant Orders   C. trachomatis/N. gonorrhoeae RNA   Hepatitis panel, acute   HIV antibody   RPR   Preventative health care - Primary    USPSTF grade A and B recommendations reviewed with patient; age-appropriate recommendations, preventive care, screening tests, etc discussed and encouraged; healthy living encouraged; see AVS for patient education given to patient       Relevant Orders   CBC with Differential/Platelet   COMPLETE METABOLIC PANEL WITH GFR   Lipid panel   TSH    Other Visit Diagnoses    Need for vaccine for DT (diphtheria-tetanus)       Leg pain, superior, right       suspect strain of long head of the biceps femoris; conservative measures only       No orders of the defined types were placed in this encounter.  Orders Placed This Encounter  Procedures  .  C. trachomatis/N. gonorrhoeae RNA  . Td : Tetanus/diphtheria >7yo Preservative  free  . Hepatitis panel, acute  . HIV antibody  . RPR  . VITAMIN D 25 Hydroxy (Vit-D Deficiency, Fractures)  . B12  . CBC with Differential/Platelet  . COMPLETE METABOLIC PANEL WITH GFR  .  Lipid panel  . TSH  . T4, free    Follow up plan: Return in about 1 year (around 08/04/2018) for complete physical.  An After Visit Summary was printed and given to the patient.

## 2017-08-03 NOTE — Assessment & Plan Note (Signed)
USPSTF grade A and B recommendations reviewed with patient; age-appropriate recommendations, preventive care, screening tests, etc discussed and encouraged; healthy living encouraged; see AVS for patient education given to patient  

## 2017-08-03 NOTE — Assessment & Plan Note (Signed)
Refer to new endocrinologist 

## 2017-08-03 NOTE — Assessment & Plan Note (Signed)
Condom use; implanon; labs

## 2017-08-03 NOTE — Patient Instructions (Addendum)
Check out the information at familydoctor.org entitled "Nutrition for Weight Loss: What You Need to Know about Fad Diets" Try to lose between 1-2 pounds per week by taking in fewer calories and burning off more calories You can succeed by limiting portions, limiting foods dense in calories and fat, becoming more active, and drinking 8 glasses of water a day (64 ounces) Don't skip meals, especially breakfast, as skipping meals may alter your metabolism Do not use over-the-counter weight loss pills or gimmicks that claim rapid weight loss A healthy BMI (or body mass index) is between 18.5 and 24.9 You can calculate your ideal BMI at the [employer] website [employer] Start screening for colon cancer at age 45 I'll suggest vitamin D3 1,000 iu daily if not getting adequate sunlight  Please do get the numbers for cardiology and pulmonology to schedule appointments with them for your chest pain and shortness of breath  We'll get labs today If you have not heard anything from my staff in a week about any orders/referrals/studies from today, please contact us here to follow-up [phone] Health Maintenance, Female Adopting a healthy lifestyle and getting preventive care can go a long way to promote health and wellness. Talk with your health care provider about what schedule of regular examinations is right for you. This is a good chance for you to check in with your provider about disease prevention and staying healthy. In between checkups, there are plenty of things you can do on your own. Experts have done a lot of research about which lifestyle changes and preventive measures are most likely to keep you healthy. Ask your health care provider for more information. Weight and diet Eat a healthy diet  Be sure to include plenty of vegetables, fruits, low-fat dairy products, and lean protein.  Do not eat a lot of foods high in solid fats, added sugars,  or salt.  Get regular exercise. This is one of the most important things you can do for your health. ? Most adults should exercise for at least 150 minutes each week. The exercise should increase your heart rate and make you sweat (moderate-intensity exercise). ? Most adults should also do strengthening exercises at least twice a week. This is in addition to the moderate-intensity exercise.  Maintain a healthy weight  Body mass index (BMI) is a measurement that can be used to identify possible weight problems. It estimates body fat based on height and weight. Your health care provider can help determine your BMI and help you achieve or maintain a healthy weight.  For females 7 years of age and older: ? A BMI below 18.5 is considered underweight. ? A BMI of 18.5 to 24.9 is normal. ? A BMI of 25 to 29.9 is considered overweight. ? A BMI of 30 and above is considered obese.  Watch levels of cholesterol and blood lipids  You should start having your blood tested for lipids and cholesterol at 38 years of age, then have this test every 5 years.  You may need to have your cholesterol levels checked more often if: ? Your lipid or cholesterol levels are high. ? You are older than 38 years of age. ? You are at high risk for heart disease.  Cancer screening Lung Cancer  Lung cancer screening is recommended for adults 60-10 years old who are at high risk for lung cancer because of a history of smoking.  A yearly low-dose CT scan of the lungs is recommended for people who: ? Currently smoke. ?  Have quit within the past 15 years. ? Have at least a 30-pack-year history of smoking. A pack year is smoking an average of one pack of cigarettes a day for 1 year.  Yearly screening should continue until it has been 15 years since you quit.  Yearly screening should stop if you develop a health problem that would prevent you from having lung cancer treatment.  Breast Cancer  Practice breast  self-awareness. This means understanding how your breasts normally appear and feel.  It also means doing regular breast self-exams. Let your health care provider know about any changes, no matter how small.  If you are in your 20s or 30s, you should have a clinical breast exam (CBE) by a health care provider every 1-3 years as part of a regular health exam.  If you are 36 or older, have a CBE every year. Also consider having a breast X-ray (mammogram) every year.  If you have a family history of breast cancer, talk to your health care provider about genetic screening.  If you are at high risk for breast cancer, talk to your health care provider about having an MRI and a mammogram every year.  Breast cancer gene (BRCA) assessment is recommended for women who have family members with BRCA-related cancers. BRCA-related cancers include: ? Breast. ? Ovarian. ? Tubal. ? Peritoneal cancers.  Results of the assessment will determine the need for genetic counseling and BRCA1 and BRCA2 testing.  Cervical Cancer Your health care provider may recommend that you be screened regularly for cancer of the pelvic organs (ovaries, uterus, and vagina). This screening involves a pelvic examination, including checking for microscopic changes to the surface of your cervix (Pap test). You may be encouraged to have this screening done every 3 years, beginning at age 29.  For women ages 51-65, health care providers may recommend pelvic exams and Pap testing every 3 years, or they may recommend the Pap and pelvic exam, combined with testing for human papilloma virus (HPV), every 5 years. Some types of HPV increase your risk of cervical cancer. Testing for HPV may also be done on women of any age with unclear Pap test results.  Other health care providers may not recommend any screening for nonpregnant women who are considered low risk for pelvic cancer and who do not have symptoms. Ask your health care provider if a  screening pelvic exam is right for you.  If you have had past treatment for cervical cancer or a condition that could lead to cancer, you need Pap tests and screening for cancer for at least 20 years after your treatment. If Pap tests have been discontinued, your risk factors (such as having a new sexual partner) need to be reassessed to determine if screening should resume. Some women have medical problems that increase the chance of getting cervical cancer. In these cases, your health care provider may recommend more frequent screening and Pap tests.  Colorectal Cancer  This type of cancer can be detected and often prevented.  Routine colorectal cancer screening usually begins at 38 years of age and continues through 38 years of age.  Your health care provider may recommend screening at an earlier age if you have risk factors for colon cancer.  Your health care provider may also recommend using home test kits to check for hidden blood in the stool.  A small camera at the end of a tube can be used to examine your colon directly (sigmoidoscopy or colonoscopy). This is done  to check for the earliest forms of colorectal cancer.  Routine screening usually begins at age 59.  Direct examination of the colon should be repeated every 5-10 years through 38 years of age. However, you may need to be screened more often if early forms of precancerous polyps or small growths are found.  Skin Cancer  Check your skin from head to toe regularly.  Tell your health care provider about any new moles or changes in moles, especially if there is a change in a mole's shape or color.  Also tell your health care provider if you have a mole that is larger than the size of a pencil eraser.  Always use sunscreen. Apply sunscreen liberally and repeatedly throughout the day.  Protect yourself by wearing long sleeves, pants, a wide-brimmed hat, and sunglasses whenever you are outside.  Heart disease, diabetes, and  high blood pressure  High blood pressure causes heart disease and increases the risk of stroke. High blood pressure is more likely to develop in: ? People who have blood pressure in the high end of the normal range (130-139/85-89 mm Hg). ? People who are overweight or obese. ? People who are African American.  If you are 29-8 years of age, have your blood pressure checked every 3-5 years. If you are 79 years of age or older, have your blood pressure checked every year. You should have your blood pressure measured twice-once when you are at a hospital or clinic, and once when you are not at a hospital or clinic. Record the average of the two measurements. To check your blood pressure when you are not at a hospital or clinic, you can use: ? An automated blood pressure machine at a pharmacy. ? A home blood pressure monitor.  If you are between 22 years and 1 years old, ask your health care provider if you should take aspirin to prevent strokes.  Have regular diabetes screenings. This involves taking a blood sample to check your fasting blood sugar level. ? If you are at a normal weight and have a low risk for diabetes, have this test once every three years after 38 years of age. ? If you are overweight and have a high risk for diabetes, consider being tested at a younger age or more often. Preventing infection Hepatitis B  If you have a higher risk for hepatitis B, you should be screened for this virus. You are considered at high risk for hepatitis B if: ? You were born in a country where hepatitis B is common. Ask your health care provider which countries are considered high risk. ? Your parents were born in a high-risk country, and you have not been immunized against hepatitis B (hepatitis B vaccine). ? You have HIV or AIDS. ? You use needles to inject street drugs. ? You live with someone who has hepatitis B. ? You have had sex with someone who has hepatitis B. ? You get hemodialysis  treatment. ? You take certain medicines for conditions, including cancer, organ transplantation, and autoimmune conditions.  Hepatitis C  Blood testing is recommended for: ? Everyone born from [birth year] through 1966. ? Anyone with known risk factors for hepatitis C.  Sexually transmitted infections (STIs)  You should be screened for sexually transmitted infections (STIs) including gonorrhea and chlamydia if: ? You are sexually active and are younger than 38 years of age. ? You are older than 38 years of age and your health care provider tells you that you are at  risk for this type of infection. ? Your sexual activity has changed since you were last screened and you are at an increased risk for chlamydia or gonorrhea. Ask your health care provider if you are at risk.  If you do not have HIV, but are at risk, it may be recommended that you take a prescription medicine daily to prevent HIV infection. This is called pre-exposure prophylaxis (PrEP). You are considered at risk if: ? You are sexually active and do not regularly use condoms or know the HIV status of your partner(s). ? You take drugs by injection. ? You are sexually active with a partner who has HIV.  Talk with your health care provider about whether you are at high risk of being infected with HIV. If you choose to begin PrEP, you should first be tested for HIV. You should then be tested every 3 months for as long as you are taking PrEP. Pregnancy  If you are premenopausal and you may become pregnant, ask your health care provider about preconception counseling.  If you may become pregnant, take 400 to 800 micrograms (mcg) of folic acid every day.  If you want to prevent pregnancy, talk to your health care provider about birth control (contraception). Osteoporosis and menopause  Osteoporosis is a disease in which the bones lose minerals and strength with aging. This can result in serious bone fractures. Your risk for osteoporosis  can be identified using a bone density scan.  If you are 46 years of age or older, or if you are at risk for osteoporosis and fractures, ask your health care provider if you should be screened.  Ask your health care provider whether you should take a calcium or vitamin D supplement to lower your risk for osteoporosis.  Menopause may have certain physical symptoms and risks.  Hormone replacement therapy may reduce some of these symptoms and risks. Talk to your health care provider about whether hormone replacement therapy is right for you. Follow these instructions at home:  Schedule regular health, dental, and eye exams.  Stay current with your immunizations.  Do not use any tobacco products including cigarettes, chewing tobacco, or electronic cigarettes.  If you are pregnant, do not drink alcohol.  If you are breastfeeding, limit how much and how often you drink alcohol.  Limit alcohol intake to no more than 1 drink per day for nonpregnant women. One drink equals 12 ounces of beer, 5 ounces of wine, or 1 ounces of hard liquor.  Do not use street drugs.  Do not share needles.  Ask your health care provider for help if you need support or information about quitting drugs.  Tell your health care provider if you often feel depressed.  Tell your health care provider if you have ever been abused or do not feel safe at home. This information is not intended to replace advice given to you by your health care provider. Make sure you discuss any questions you have with your health care provider. Document Released: 11/03/2010 Document Revised: 09/26/2015 Document Reviewed: 01/22/2015 Elsevier Interactive Patient Education  [employer].

## 2017-08-03 NOTE — Assessment & Plan Note (Signed)
Encouragement given for weight loss 

## 2017-08-03 NOTE — Assessment & Plan Note (Signed)
Refer to new endocrinologist

## 2017-08-03 NOTE — Assessment & Plan Note (Signed)
Check level today 

## 2017-08-04 LAB — LIPID PANEL
Cholesterol: 107 mg/dL (ref <200)
HDL: 50 mg/dL — ABNORMAL LOW (ref >50)
LDL Cholesterol (Calc): 44 mg/dL (calc)
Non-HDL Cholesterol (Calc): 57 mg/dL (calc) (ref <130)
Total CHOL/HDL Ratio: 2.1 (calc) (ref <5.0)
Triglycerides: 54 mg/dL (ref <150)

## 2017-08-04 LAB — COMPLETE METABOLIC PANEL WITH GFR
AG Ratio: 1.1 (calc) (ref 1.0–2.5)
ALT: 15 U/L (ref 6–29)
AST: 18 U/L (ref 10–30)
Albumin: 4 g/dL (ref 3.6–5.1)
Alkaline phosphatase (APISO): 63 U/L (ref 33–115)
BUN: 11 mg/dL (ref 7–25)
CO2: 24 mmol/L (ref 20–32)
Calcium: 9.4 mg/dL (ref 8.6–10.2)
Chloride: 110 mmol/L (ref 98–110)
Creat: 0.78 mg/dL (ref 0.50–1.10)
GFR, Est African American: 113 mL/min/{1.73_m2} (ref >=60)
GFR, Est Non African American: 97 mL/min/{1.73_m2} (ref >=60)
Globulin: 3.6 g/dL (calc) (ref 1.9–3.7)
Glucose, Bld: 80 mg/dL (ref 65–139)
Potassium: 4.1 mmol/L (ref 3.5–5.3)
Sodium: 131 mmol/L — ABNORMAL LOW (ref 135–146)
Total Bilirubin: 0.4 mg/dL (ref 0.2–1.2)
Total Protein: 7.6 g/dL (ref 6.1–8.1)

## 2017-08-04 LAB — CBC WITH DIFFERENTIAL/PLATELET
Basophils Absolute: 17 cells/uL (ref 0–200)
Basophils Relative: 0.3 %
Eosinophils Absolute: 52 cells/uL (ref 15–500)
Eosinophils Relative: 0.9 %
HCT: 36.7 % (ref 35.0–45.0)
Hemoglobin: 12.4 g/dL (ref 11.7–15.5)
Lymphs Abs: 2192 cells/uL (ref 850–3900)
MCH: 28.8 pg (ref 27.0–33.0)
MCHC: 33.8 g/dL (ref 32.0–36.0)
MCV: 85.2 fL (ref 80.0–100.0)
MPV: 10 fL (ref 7.5–12.5)
Monocytes Relative: 7.3 %
Neutro Abs: 3115 cells/uL (ref 1500–7800)
Neutrophils Relative %: 53.7 %
Platelets: 315 10*3/uL (ref 140–400)
RBC: 4.31 10*6/uL (ref 3.80–5.10)
RDW: 12.9 % (ref 11.0–15.0)
Total Lymphocyte: 37.8 %
WBC mixed population: 423 cells/uL (ref 200–950)
WBC: 5.8 10*3/uL (ref 3.8–10.8)

## 2017-08-04 LAB — RPR: RPR Ser Ql: NONREACTIVE

## 2017-08-04 LAB — C. TRACHOMATIS/N. GONORRHOEAE RNA
C. trachomatis RNA, TMA: NOT DETECTED
N. gonorrhoeae RNA, TMA: NOT DETECTED

## 2017-08-04 LAB — T4, FREE: Free T4: 1.1 ng/dL (ref 0.8–1.8)

## 2017-08-04 LAB — HEPATITIS PANEL, ACUTE
Hep A IgM: NONREACTIVE
Hep B C IgM: NONREACTIVE
Hepatitis B Surface Ag: NONREACTIVE
Hepatitis C Ab: NONREACTIVE
SIGNAL TO CUT-OFF: 0.03 (ref <1.00)

## 2017-08-04 LAB — TSH: TSH: 3.76 mIU/L

## 2017-08-04 LAB — VITAMIN B12: Vitamin B-12: 317 pg/mL (ref 200–1100)

## 2017-08-04 LAB — VITAMIN D 25 HYDROXY (VIT D DEFICIENCY, FRACTURES): Vit D, 25-Hydroxy: 16 ng/mL — ABNORMAL LOW (ref 30–100)

## 2017-08-04 LAB — HIV ANTIBODY (ROUTINE TESTING W REFLEX): HIV 1&2 Ab, 4th Generation: NONREACTIVE

## 2017-08-09 ENCOUNTER — Other Ambulatory Visit: Payer: Self-pay | Admitting: Family Medicine

## 2017-08-09 MED ORDER — VITAMIN B-12 500 MCG PO TABS: 500.0000 ug | ORAL_TABLET | Freq: Every day | ORAL | 1 refills | Status: DC

## 2017-08-09 MED ORDER — VITAMIN D (ERGOCALCIFEROL) 1.25 MG (50000 UNIT) PO CAPS
50000.0000 [IU] | ORAL_CAPSULE | ORAL | 1 refills | Status: AC
Start: 2017-08-09 — End: 2017-10-04

## 2017-08-09 NOTE — Progress Notes (Signed)
Start Rx vitamin D Sent in Rx for vitamin B12 (may be covered better with Rx, but should be OTC too)

## 2017-10-18 ENCOUNTER — Encounter: Payer: Self-pay | Admitting: Family Medicine

## 2017-10-18 MED ORDER — FLUCONAZOLE 150 MG PO TABS: 150.0000 mg | ORAL_TABLET | Freq: Once | ORAL | 0 refills | Status: AC

## 2017-11-11 ENCOUNTER — Encounter: Payer: Self-pay | Admitting: Family Medicine

## 2017-11-11 DIAGNOSIS — E041 Nontoxic single thyroid nodule: Secondary | ICD-10-CM

## 2017-11-11 DIAGNOSIS — E0590 Thyrotoxicosis, unspecified without thyrotoxic crisis or storm: Secondary | ICD-10-CM

## 2017-11-11 DIAGNOSIS — Z308 Encounter for other contraceptive management: Secondary | ICD-10-CM

## 2017-12-29 DIAGNOSIS — E890 Postprocedural hypothyroidism: Secondary | ICD-10-CM | POA: Insufficient documentation

## 2018-01-21 ENCOUNTER — Encounter: Payer: Self-pay | Admitting: Family Medicine

## 2018-01-31 ENCOUNTER — Encounter: Payer: Self-pay | Admitting: Family Medicine

## 2018-01-31 NOTE — Telephone Encounter (Signed)
Please ask pt to schedule appt

## 2018-03-10 ENCOUNTER — Telehealth: Payer: Self-pay | Admitting: Family Medicine

## 2018-03-10 DIAGNOSIS — R911 Solitary pulmonary nodule: Secondary | ICD-10-CM

## 2018-03-10 DIAGNOSIS — R918 Other nonspecific abnormal finding of lung field: Secondary | ICD-10-CM

## 2018-03-10 NOTE — Assessment & Plan Note (Signed)
Order chest CT

## 2018-03-10 NOTE — Telephone Encounter (Signed)
From chest CT Nov 2018: Non-contrast chest CT can be considered in 12 months if patient is high-risk.   -------------------------------- I'm ordering chest CT; please let her know this is for follow-up and explain how she can schedule that

## 2018-03-11 NOTE — Telephone Encounter (Signed)
Pt.notified

## 2018-03-15 ENCOUNTER — Ambulatory Visit (INDEPENDENT_AMBULATORY_CARE_PROVIDER_SITE_OTHER): Payer: Managed Care, Other (non HMO) | Admitting: Family Medicine

## 2018-03-15 ENCOUNTER — Telehealth: Payer: Self-pay | Admitting: Family Medicine

## 2018-03-15 ENCOUNTER — Encounter: Payer: Self-pay | Admitting: Family Medicine

## 2018-03-15 VITALS — BP 116/70 | HR 68 | Temp 98.1°F | Ht 63.0 in | Wt 216.5 lb

## 2018-03-15 DIAGNOSIS — Z8249 Family history of ischemic heart disease and other diseases of the circulatory system: Secondary | ICD-10-CM

## 2018-03-15 DIAGNOSIS — E6609 Other obesity due to excess calories: Secondary | ICD-10-CM

## 2018-03-15 DIAGNOSIS — Z6838 Body mass index (BMI) 38.0-38.9, adult: Secondary | ICD-10-CM

## 2018-03-15 DIAGNOSIS — R002 Palpitations: Secondary | ICD-10-CM

## 2018-03-15 DIAGNOSIS — Z23 Encounter for immunization: Secondary | ICD-10-CM | POA: Diagnosis not present

## 2018-03-15 DIAGNOSIS — E559 Vitamin D deficiency, unspecified: Secondary | ICD-10-CM

## 2018-03-15 DIAGNOSIS — Z8742 Personal history of other diseases of the female genital tract: Secondary | ICD-10-CM

## 2018-03-15 DIAGNOSIS — R000 Tachycardia, unspecified: Secondary | ICD-10-CM | POA: Diagnosis not present

## 2018-03-15 DIAGNOSIS — E538 Deficiency of other specified B group vitamins: Secondary | ICD-10-CM

## 2018-03-15 DIAGNOSIS — R5383 Other fatigue: Secondary | ICD-10-CM

## 2018-03-15 DIAGNOSIS — R911 Solitary pulmonary nodule: Secondary | ICD-10-CM

## 2018-03-15 NOTE — Assessment & Plan Note (Signed)
Encouraged weight loss 

## 2018-03-15 NOTE — Telephone Encounter (Signed)
Copied from CRM [id number]. Topic: Quick Communication - See Telephone Encounter >> Mar 15, 2018 12:46 PM Jay SchlichterWeikart, Melissa J wrote: CRM for notification. See Telephone encounter for: 03/15/18. Prior authorization for CT was denied - insurance will not cover any additional scanning unless nodule is 6mm in size or larger.  Please let me know if you would like to schedule peer to peer

## 2018-03-15 NOTE — Assessment & Plan Note (Signed)
Check labs 

## 2018-03-15 NOTE — Assessment & Plan Note (Signed)
Followed by GYN, last pap smear was with [facility]; last was normal per patient

## 2018-03-15 NOTE — Progress Notes (Signed)
BP 116/70   Pulse 68   Temp 98.1 F (36.7 C) (Oral)   Ht 5\' 3"  (1.6 m)   Wt 216 lb 8 oz (98.2 kg)   SpO2 99%   BMI 38.35 kg/m    Subjective:    Patient ID: Catherine Berg, female    DOB: 08/10/1980, 37 y.o.   MRN: [id number]  HPI: Catherine Berg is a 37 y.o. female  Chief Complaint  Patient presents with  . 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 Maisie Fus, new thyroid doctor, at [facility] about two months Last TSH was 3.43 on December 29, 2017 Feels overwhelmed, but not depressed Reviewed last lipids Had some SHOB and is going to get the scan redone Still taking symbicort; using rescue inhaler 2-3 x a week; no wheezing Hx of vitamin D deficiency; not taking supplement Hx of vitamin B12 deficiency; taking supplement Not sleeping as well as she could; not drinking more than 1 cup of coffee a day Strong fam hx of heart attack: mother, maternal GM, and maternal GGM  Depression screen [facility] 2/9 03/15/2018 08/03/2017 04/20/2017 12/18/2016 09/22/2016  Decreased Interest 0 0 0 0 0  Down, Depressed, Hopeless 0 0 1 0 1  PHQ - 2 Score 0 0 1 0 1  Altered sleeping 0 - - - -  Tired, decreased energy 0 - - - -  Change in appetite 0 - - - -  Feeling bad or failure about yourself  0 - - - -  Trouble concentrating 0 - - - -  Moving slowly or fidgety/restless 0 - - - -  Suicidal thoughts 0 - - - -  PHQ-9 Score 0 - - - -  Difficult doing work/chores Not difficult at all - - - -  Some recent data might be hidden   Fall Risk  03/15/2018 08/03/2017 04/20/2017 12/18/2016 09/22/2016    Falls in the past year? 1 Yes No No No  Number falls in past yr: 1 1 - - -  Injury with Fall? 0 Yes - - -  Comment - brusise - - -    Relevant past medical, surgical, family and social history reviewed Past Medical History:  Diagnosis Date  . Abnormal thyroid blood test   . Anxiety   . 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 Graves disease   . History of abnormal cervical Pap smear   . History of cervical polypectomy   . Hives 09/02/2015  . Hypertension   . IFG (impaired fasting glucose)   . Incidental lung nodule, > 3mm and < 8mm 03/31/2017   4 mm RLL lung nodule on chest CT Mar 31, 2017  . Low serum vitamin D   . Obesity   . Pregnancy induced hypertension    with 1st pregnancy, normal pressure with 2nd  . Reflux   . Thyroid disease   . Vitamin B12 deficiency   . Vitamin D deficiency disease    Past Surgical History:  Procedure Laterality Date  .  CERVICAL POLYPECTOMY    . CESAREAN SECTION     x 2   Family History  Problem Relation Age of Onset  . Heart attack Mother 37  . Hypertension Mother   . Asthma Mother   . Cancer Mother        laryngeal  . Diabetes Mother        pre diabetic  . Heart disease Mother   . Mental illness Mother   . Alcohol abuse Mother   . Drug abuse Mother   . Depression Mother   . Anxiety disorder Mother   . Bipolar disorder Mother   . Learning disabilities Brother   . ADD / ADHD Brother   . Asthma Son   . Hyperlipidemia Brother   . Alcohol abuse Father   . Heart disease Maternal Grandmother        heart attack, pacemaker  . Heart attack Maternal Grandmother   . Hypertension Maternal Grandmother   . Hypertension Maternal Grandfather   . Heart disease Paternal Grandmother        couple of major open heart surgeries, leaking valves  . Hypertension Paternal Grandmother   . Hypertension Paternal Grandfather    Social History   Tobacco Use  . Smoking status: Never Smoker  . Smokeless tobacco: Never Used   Substance Use Topics  . Alcohol use: No    Alcohol/week: 0.0 standard drinks  . Drug use: No     [facility] Visit from 03/15/2018 in [facility]  AUDIT-C Score  0      Interim medical history since last visit reviewed. Allergies and medications reviewed  Review of Systems Per HPI unless specifically indicated above     Objective:    BP 116/70   Pulse 68   Temp 98.1 F (36.7 C) (Oral)   Ht 5\' 3"  (1.6 m)   Wt 216 lb 8 oz (98.2 kg)   SpO2 99%   BMI 38.35 kg/m   Wt Readings from Last 3 Encounters:  03/15/18 216 lb 8 oz (98.2 kg)  08/03/17 216 lb 6.4 oz (98.2 kg)  04/20/17 213 lb 8 oz (96.8 kg)    Physical Exam  Constitutional: She appears well-developed and well-nourished. No distress.  HENT:  Head: Normocephalic and atraumatic.  Eyes: EOM are normal. No scleral icterus.  Neck: No thyromegaly present.  Cardiovascular: Normal rate, regular rhythm and normal heart sounds.  No murmur heard. Pulmonary/Chest: Effort normal and breath sounds normal. No respiratory distress. She has no wheezes.  Abdominal: Soft. Bowel sounds are normal. She exhibits no distension.  Musculoskeletal: She exhibits no edema.  Neurological: She is alert. She displays no tremor.  Skin: Skin is warm and dry. She is not diaphoretic. No pallor.  Psychiatric: She has a normal mood and affect. Her behavior is normal. Judgment and thought content normal.      Assessment & Plan:   Problem List Items Addressed This Visit      Other   Vitamin D deficiency disease    Check level today; last level was 16, could explain fatigue      Vitamin B12 deficiency    Check level, supplement if needed      Relevant Orders   Vitamin B12   Obesity    Encouraged weight loss      Incidental lung nodule, > 3mm and < 8mm    Chest CT to be schedule soon      History of abnormal cervical Pap smear  Followed by GYN, last pap smear was with [facility]; last was normal per patient       Fatigue    Check labs       Other Visit Diagnoses    Tachycardia    -  Primary   EKG done today; NSR, rate 61, normal axis, no ST-T wave changes   Relevant Orders   Ambulatory referral to Cardiology   CBC with Differential/Platelet   Comprehensive metabolic panel   Palpitations       Relevant Orders   EKG 12-Lead   Ambulatory referral to Cardiology   CBC with Differential/Platelet   Comprehensive metabolic panel   Need for influenza vaccination       Relevant Orders   Flu Vaccine QUAD 6+ mos PF IM (Fluarix Quad PF) (Completed)   Family history of heart attack       refer to cardiology; she knows to call 911 if she thinks she is having a heart attack   Relevant Orders   Ambulatory referral to Cardiology   VITAMIN D 25 Hydroxy (Vit-D Deficiency, Fractures)       Follow up plan: No follow-ups on file.  An after-visit summary was printed and given to the patient at check-out.  Please see the patient instructions which may contain other information and recommendations beyond what is mentioned above in the assessment and plan.  No orders of the defined types were placed in this encounter.   Orders Placed This Encounter  Procedures  . Flu Vaccine QUAD 6+ mos PF IM (Fluarix Quad PF)  . CBC with Differential/Platelet  . Vitamin B12  . VITAMIN D 25 Hydroxy (Vit-D Deficiency, Fractures)  . Comprehensive metabolic panel  . Ambulatory referral to Cardiology  . EKG 12-Lead

## 2018-03-15 NOTE — Assessment & Plan Note (Signed)
Check level, supplement if needed 

## 2018-03-15 NOTE — Assessment & Plan Note (Signed)
Check level today; last level was 16, could explain fatigue

## 2018-03-15 NOTE — Assessment & Plan Note (Signed)
Chest CT to be schedule soon

## 2018-03-16 LAB — CBC WITH DIFFERENTIAL/PLATELET
Basophils Absolute: 32 cells/uL (ref 0–200)
Basophils Relative: 0.7 %
Eosinophils Absolute: 108 cells/uL (ref 15–500)
Eosinophils Relative: 2.4 %
HCT: 37.3 % (ref 35.0–45.0)
Hemoglobin: 12.7 g/dL (ref 11.7–15.5)
Lymphs Abs: 2021 cells/uL (ref 850–3900)
MCH: 28.2 pg (ref 27.0–33.0)
MCHC: 34 g/dL (ref 32.0–36.0)
MCV: 82.9 fL (ref 80.0–100.0)
MPV: 9.9 fL (ref 7.5–12.5)
Monocytes Relative: 7.1 %
Neutro Abs: 2021 cells/uL (ref 1500–7800)
Neutrophils Relative %: 44.9 %
Platelets: 352 10*3/uL (ref 140–400)
RBC: 4.5 10*6/uL (ref 3.80–5.10)
RDW: 12.9 % (ref 11.0–15.0)
Total Lymphocyte: 44.9 %
WBC mixed population: 320 cells/uL (ref 200–950)
WBC: 4.5 10*3/uL (ref 3.8–10.8)

## 2018-03-16 LAB — COMPREHENSIVE METABOLIC PANEL
AG Ratio: 1.2 (calc) (ref 1.0–2.5)
ALT: 15 U/L (ref 6–29)
AST: 19 U/L (ref 10–30)
Albumin: 4 g/dL (ref 3.6–5.1)
Alkaline phosphatase (APISO): 70 U/L (ref 33–115)
BUN: 11 mg/dL (ref 7–25)
CO2: 26 mmol/L (ref 20–32)
Calcium: 9.6 mg/dL (ref 8.6–10.2)
Chloride: 104 mmol/L (ref 98–110)
Creat: 0.8 mg/dL (ref 0.50–1.10)
Globulin: 3.4 g/dL (calc) (ref 1.9–3.7)
Glucose, Bld: 90 mg/dL (ref 65–139)
Potassium: 4.6 mmol/L (ref 3.5–5.3)
Sodium: 137 mmol/L (ref 135–146)
Total Bilirubin: 0.4 mg/dL (ref 0.2–1.2)
Total Protein: 7.4 g/dL (ref 6.1–8.1)

## 2018-03-16 LAB — VITAMIN B12: Vitamin B-12: 374 pg/mL (ref 200–1100)

## 2018-03-16 LAB — VITAMIN D 25 HYDROXY (VIT D DEFICIENCY, FRACTURES): Vit D, 25-Hydroxy: 14 ng/mL — ABNORMAL LOW (ref 30–100)

## 2018-03-16 NOTE — Telephone Encounter (Signed)
Please let the patient know that the insurance company does not want to cover the CT scan I reviewed the CT report again The radiologist said "no follow-up needed if patient is low-risk." The follow-up CT scan in one year was only recommended if she was high-risk Please verify that she is not a smoker If not a smoker and low risk, then no CT scan is needed

## 2018-03-18 ENCOUNTER — Other Ambulatory Visit: Payer: Self-pay | Admitting: Family Medicine

## 2018-03-18 MED ORDER — VITAMIN D (ERGOCALCIFEROL) 1.25 MG (50000 UNIT) PO CAPS: 50000.0000 [IU] | ORAL_CAPSULE | ORAL | 1 refills | Status: AC

## 2018-03-18 NOTE — Progress Notes (Signed)
Ergocalciferol weekly x 8 weeks, then 1000 iu vit D3 daily OTC

## 2018-05-02 ENCOUNTER — Ambulatory Visit: Payer: Self-pay | Admitting: Internal Medicine

## 2018-05-02 NOTE — Progress Notes (Deleted)
New Outpatient Visit Date: 05/02/2018  Referring Provider: Kerman PasseyLada, Melinda P, MD [address] [place], [place] [address]  Chief Complaint: ***  HPI:  [name] is a 37 y.o. female who is being seen today for the evaluation of elevated heart rate and fatigue at the request of Dr. Sherie DonLada. She has a history of Graves' disease, hypertension, acid reflux, and anxiety. ***  --------------------------------------------------------------------------------------------------  Cardiovascular History & Procedures: Cardiovascular Problems:  Elevated heart rate  Risk Factors:  Hypertension and obesity  Cath/PCI:  None  CV Surgery:  None  EP Procedures and Devices:  None  Non-Invasive Evaluation(s):  TTE (10/03/2015): Normal LV size.  LVEF 55 to 65%.  Normal RV size and function.  No significant valvular abnormalities.  Recent CV Pertinent Labs: Lab Results  Component Value Date   CHOL 107 08/03/2017   HDL 50 (L) 08/03/2017   LDLCALC 44 08/03/2017   TRIG 54 08/03/2017   CHOLHDL 2.1 08/03/2017   BNP 17.0 03/31/2017   K 4.6 03/15/2018   K 3.9 08/13/2012   BUN 11 03/15/2018   BUN 9 07/21/2016   BUN 10 08/13/2012   CREATININE 0.80 03/15/2018    --------------------------------------------------------------------------------------------------  Past Medical History:  Diagnosis Date  . Abnormal thyroid blood test   . Anxiety   . 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 Graves disease   . History of abnormal cervical Pap smear   . History of cervical polypectomy   . Hives 09/02/2015  . Hypertension   . IFG (impaired fasting glucose)   . Incidental lung nodule, > 3mm and < 8mm 03/31/2017   4 mm RLL lung nodule on chest CT Mar 31, 2017  . Low serum vitamin D   . Obesity   . Pregnancy induced hypertension    with 1st pregnancy, normal pressure with 2nd  . Reflux   . Thyroid disease   . Vitamin B12 deficiency   . Vitamin D deficiency  disease     Past Surgical History:  Procedure Laterality Date  . CERVICAL POLYPECTOMY    . CESAREAN SECTION     x 2    No outpatient medications have been marked as taking for the 05/02/18 encounter (Appointment) with Hymie Gorr, Cristal Deerhristopher, MD.    Allergies: Latex and Shellfish allergy  Social History   Tobacco Use  . Smoking status: Never Smoker  . Smokeless tobacco: Never Used  Substance Use Topics  . Alcohol use: No    Alcohol/week: 0.0 standard drinks  . Drug use: No    Family History  Problem Relation Age of Onset  . Heart attack Mother 1347  . Hypertension Mother   . Asthma Mother   . Cancer Mother        laryngeal  . Diabetes Mother        pre diabetic  . Heart disease Mother   . Mental illness Mother   . Alcohol abuse Mother   . Drug abuse Mother   . Depression Mother   . Anxiety disorder Mother   . Bipolar disorder Mother   . Learning disabilities Brother   . ADD / ADHD Brother   . Asthma Son   . Hyperlipidemia Brother   . Alcohol abuse Father   . Heart disease Maternal Grandmother        heart attack, pacemaker  . Heart attack Maternal Grandmother   . Hypertension Maternal Grandmother   . Hypertension Maternal Grandfather   . Heart disease Paternal Grandmother  couple of major open heart surgeries, leaking valves  . Hypertension Paternal Grandmother   . Hypertension Paternal Grandfather     Review of Systems: A 12-system review of systems was performed and was negative except as noted in the HPI.  --------------------------------------------------------------------------------------------------  Physical Exam: There were no vitals taken for this visit.  General:  *** HEENT: No conjunctival pallor or scleral icterus. Moist mucous membranes. OP clear. Neck: Supple without lymphadenopathy, thyromegaly, JVD, or HJR. No carotid bruit. Lungs: Normal work of breathing. Clear to auscultation bilaterally without wheezes or crackles. Heart: Regular  rate and rhythm without murmurs, rubs, or gallops. Non-displaced PMI. Abd: Bowel sounds present. Soft, NT/ND without hepatosplenomegaly Ext: No lower extremity edema. Radial, PT, and DP pulses are 2+ bilaterally Skin: Warm and dry without rash. Neuro: CNIII-XII intact. Strength and fine-touch sensation intact in upper and lower extremities bilaterally. Psych: Normal mood and affect.  EKG:  ***  Lab Results  Component Value Date   WBC 4.5 03/15/2018   HGB 12.7 03/15/2018   HCT 37.3 03/15/2018   MCV 82.9 03/15/2018   PLT 352 03/15/2018    Lab Results  Component Value Date   NA 137 03/15/2018   K 4.6 03/15/2018   CL 104 03/15/2018   CO2 26 03/15/2018   BUN 11 03/15/2018   CREATININE 0.80 03/15/2018   GLUCOSE 90 03/15/2018   ALT 15 03/15/2018    Lab Results  Component Value Date   CHOL 107 08/03/2017   HDL 50 (L) 08/03/2017   LDLCALC 44 08/03/2017   TRIG 54 08/03/2017   CHOLHDL 2.1 08/03/2017     --------------------------------------------------------------------------------------------------  ASSESSMENT AND PLAN: Cristal Deer***  Aalijah Lanphere, MD 05/02/2018 3:37 PM

## 2018-05-03 ENCOUNTER — Encounter: Payer: Self-pay | Admitting: Internal Medicine

## 2018-05-31 ENCOUNTER — Other Ambulatory Visit: Payer: Self-pay

## 2018-05-31 ENCOUNTER — Emergency Department: Payer: Managed Care, Other (non HMO)

## 2018-05-31 DIAGNOSIS — R0602 Shortness of breath: Secondary | ICD-10-CM | POA: Insufficient documentation

## 2018-05-31 DIAGNOSIS — I10 Essential (primary) hypertension: Secondary | ICD-10-CM | POA: Insufficient documentation

## 2018-05-31 DIAGNOSIS — Z79899 Other long term (current) drug therapy: Secondary | ICD-10-CM | POA: Diagnosis not present

## 2018-05-31 DIAGNOSIS — E0500 Thyrotoxicosis with diffuse goiter without thyrotoxic crisis or storm: Secondary | ICD-10-CM | POA: Insufficient documentation

## 2018-05-31 LAB — CBC
HCT: 35.6 % — ABNORMAL LOW (ref 36.0–46.0)
Hemoglobin: 11.7 g/dL — ABNORMAL LOW (ref 12.0–15.0)
MCH: 28.5 pg (ref 26.0–34.0)
MCHC: 32.9 g/dL (ref 30.0–36.0)
MCV: 86.6 fL (ref 80.0–100.0)
Platelets: 294 10*3/uL (ref 150–400)
RBC: 4.11 MIL/uL (ref 3.87–5.11)
RDW: 13 % (ref 11.5–15.5)
WBC: 7.6 10*3/uL (ref 4.0–10.5)
nRBC: 0 % (ref 0.0–0.2)

## 2018-05-31 NOTE — ED Triage Notes (Signed)
Pt states she woke up with sensation that "I had stopped breathing" but states was not shob. Then started having palpitations and shob with back pain.

## 2018-06-01 ENCOUNTER — Encounter: Payer: Self-pay | Admitting: Family Medicine

## 2018-06-01 ENCOUNTER — Emergency Department
Admission: EM | Admit: 2018-06-01 | Discharge: 2018-06-01 | Disposition: A | Discharge status: Home / Self Care | Payer: Managed Care, Other (non HMO) | Attending: Emergency Medicine | Admitting: Emergency Medicine

## 2018-06-01 ENCOUNTER — Ambulatory Visit: Payer: Self-pay | Admitting: Nurse Practitioner

## 2018-06-01 DIAGNOSIS — R0602 Shortness of breath: Secondary | ICD-10-CM

## 2018-06-01 LAB — COMPREHENSIVE METABOLIC PANEL
ALT: 20 U/L (ref 0–44)
AST: 22 U/L (ref 15–41)
Albumin: 4 g/dL (ref 3.5–5.0)
Alkaline Phosphatase: 60 U/L (ref 38–126)
Anion gap: 6 (ref 5–15)
BUN: 16 mg/dL (ref 6–20)
CO2: 24 mmol/L (ref 22–32)
Calcium: 8.7 mg/dL — ABNORMAL LOW (ref 8.9–10.3)
Chloride: 107 mmol/L (ref 98–111)
Creatinine, Ser: 0.86 mg/dL (ref 0.44–1.00)
GFR calc Af Amer: >60 mL/min (ref >60)
GFR calc non Af Amer: >60 mL/min (ref >60)
Glucose, Bld: 93 mg/dL (ref 70–99)
Potassium: 3.5 mmol/L (ref 3.5–5.1)
Sodium: 137 mmol/L (ref 135–145)
Total Bilirubin: 0.7 mg/dL (ref 0.3–1.2)
Total Protein: 7.5 g/dL (ref 6.5–8.1)

## 2018-06-01 LAB — TROPONIN I: Troponin I: <0.03 ng/mL (ref <0.03)

## 2018-06-01 NOTE — Discharge Instructions (Signed)
As we discussed, your work-up was reassuring today.  Based on the description of your symptoms, I think it is most likely that she had a brief episode of sleep apnea while you are falling asleep on your back.  Please read through the included information and follow-up with your regular doctor about whether or not you would benefit from sleep testing, a CPAP to use at night, etc.  Return to the emergency department if you develop new or worsening symptoms that concern you.

## 2018-06-01 NOTE — ED Provider Notes (Signed)
[facility] Emergency Department Provider Note  ____________________________________________   First MD Initiated Contact with Patient 06/01/18 [phone]     (approximate)  I have reviewed the triage vital signs and the nursing notes.   HISTORY  Chief Complaint Shortness of Breath    HPI Catherine Berg is a 38 y.o. female with medical history as listed below which notably also includes a diagnosis of sleep apnea that she was told "a while ago".  She presents for evaluation of a brief episode of shortness of breath.  She says that she was falling asleep on her back and then she woke up gasping and feeling like she had stopped breathing.  After that she felt little bit short of breath and some palpitations which subsided, but given the acute onset and severity of the symptoms she felt like she should get checked out.  She has been asymptomatic since that time.  She denies any recent illnesses.  She denies fever/chills, chest pain, nausea, vomiting, abdominal pain, and dysuria.  She has not had similar symptoms in the past.  Nothing in particular made it better or worse but now she is asymptomatic.  She has a Nexplanon implant, no history of blood clots in the legs of the lungs, no recent immobilizations, no unilateral leg pain or swelling.  Past Medical History:  Diagnosis Date  . Abnormal thyroid blood test   . Anxiety   . 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 Graves disease   . History of abnormal cervical Pap smear   . History of cervical polypectomy   . Hives 09/02/2015  . Hypertension   . IFG (impaired fasting glucose)   . Incidental lung nodule, > 3mm and < 8mm 03/31/2017   4 mm RLL lung nodule on chest CT Mar 31, 2017  . Low serum vitamin D   . Obesity   . Pregnancy induced hypertension    with 1st pregnancy, normal pressure with 2nd  . Reflux   . Thyroid disease   . Vitamin B12 deficiency   . Vitamin D deficiency disease      Patient Active Problem List   Diagnosis Date Noted  . Preventative health care 08/03/2017  . Screen for STD (sexually transmitted disease) 08/03/2017  . Bilateral leg edema 04/20/2017  . Incidental lung nodule, > 3mm and < 8mm 03/31/2017  . Abnormal first trimester screen   . Supervision of high-risk pregnancy of elderly multigravida (>= 31 years old at time of delivery), first trimester 07/22/2016  . Previous cesarean delivery, delivered 06/04/2016  . Renal insufficiency 04/23/2016  . 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 Postcoital bleeding 03/04/2016  . Genital sore 01/08/2016  . Fatigue 12/22/2015  . Constipation 11/07/2015  . Hives 09/02/2015  . Obesity 08/15/2015  . Anxiety 07/09/2015  . 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 Allergic reaction 05/07/2015  . Thyroid nodule 02/01/2015  . Hyperthyroidism 12/25/2014  . Vitamin D deficiency disease   . Vitamin B12 deficiency   . History of abnormal cervical Pap smear   . Allergy-induced asthma, mild intermittent, uncomplicated 10/09/2013    Past Surgical History:  Procedure Laterality Date  . CERVICAL POLYPECTOMY    . CESAREAN SECTION     x 2    Prior to Admission medications   Medication Sig Start Date End Date Taking? Authorizing Provider  albuterol (PROVENTIL HFA;VENTOLIN HFA) 108 (90 Base) MCG/ACT inhaler Inhale 2 puffs into the lungs every 6 (six) hours as needed for wheezing  or shortness of breath.    [provider]  budesonide-formoterol (SYMBICORT) 80-4.5 MCG/ACT inhaler Inhale 2 puffs into the lungs 2 (two) times daily. This replaces Breo 03/05/17   Kerman PasseyLada, Melinda P, MD  Elastic Bandages & Supports (MEDICAL COMPRESSION STOCKINGS) MISC Wear from morning to night, but do not sleep in these 04/20/17   Lada, Janit BernMelinda P, MD  fluticasone (FLONASE) 50 MCG/ACT nasal spray Place 2 sprays into both nostrils daily. 09/22/16   Kerman PasseyLada, Melinda P, MD  hydrOXYzine (ATARAX/VISTARIL) 25 MG tablet Take 1 tablet (25 mg total)  by mouth 3 (three) times daily as needed. 02/03/17   Lada, Janit BernMelinda P, MD  meloxicam (MOBIC) 7.5 MG tablet Take 1 tablet (7.5 mg total) by mouth daily. For the first 7-10 days, then just daily if needed 12/18/16   Kerman PasseyLada, Melinda P, MD  sertraline (ZOLOFT) 50 MG tablet Take 1 tablet (50 mg total) by mouth daily. 08/14/16   Hildred Laserherry, Anika, MD  vitamin B-12 (CYANOCOBALAMIN) 500 MCG tablet Take 1 tablet (500 mcg total) by mouth daily. 08/09/17   Kerman PasseyLada, Melinda P, MD    Allergies Latex and Shellfish allergy  Family History  Problem Relation Age of Onset  . Heart attack Mother [id number]  . Hypertension Mother   . Asthma Mother   . Cancer Mother        laryngeal  . Diabetes Mother        pre diabetic  . Heart disease Mother   . Mental illness Mother   . Alcohol abuse Mother   . Drug abuse Mother   . Depression Mother   . Anxiety disorder Mother   . Bipolar disorder Mother   . Learning disabilities Brother   . ADD / ADHD Brother   . Asthma Son   . Hyperlipidemia Brother   . Alcohol abuse Father   . Heart disease Maternal Grandmother        heart attack, pacemaker  . Heart attack Maternal Grandmother   . Hypertension Maternal Grandmother   . Hypertension Maternal Grandfather   . Heart disease Paternal Grandmother        couple of major open heart surgeries, leaking valves  . Hypertension Paternal Grandmother   . Hypertension Paternal Grandfather     Social History Social History   Tobacco Use  . Smoking status: Never Smoker  . Smokeless tobacco: Never Used  Substance Use Topics  . Alcohol use: No    Alcohol/week: 0.0 standard drinks  . Drug use: No    Review of Systems Constitutional: No fever/chills Eyes: No visual changes. ENT: No sore throat. Cardiovascular: Denies chest pain. Respiratory: Brief episode of gasping for air as described above Gastrointestinal: No abdominal pain.  No nausea, no vomiting.  No diarrhea.  No constipation. Genitourinary: Negative for  dysuria. Musculoskeletal: Negative for neck pain.  Negative for back pain. Integumentary: Negative for rash. Neurological: Negative for headaches, focal weakness or numbness.   ____________________________________________   PHYSICAL EXAM:  VITAL SIGNS: ED Triage Vitals  Enc Vitals Group     BP 05/31/18 2310 (!) 164/81     Pulse Rate 05/31/18 2310 62     Resp 05/31/18 2310 20     Temp 05/31/18 2310 98.2 F (36.8 C)     Temp Source 05/31/18 2310 Oral     SpO2 05/31/18 2310 100 %     Weight 05/31/18 2311 99.3 kg (219 lb)     Height 05/31/18 2311 1.6 m (5\' 3" )     Head  Circumference --      Peak Flow --      Pain Score 05/31/18 2311 5     Pain Loc --      Pain Edu? --      Excl. in GC? --     Constitutional: Alert and oriented. Well appearing and in no acute distress. Eyes: Conjunctivae are normal.  Head: Atraumatic. Nose: No congestion/rhinnorhea. Mouth/Throat: Mucous membranes are moist. Neck: No stridor.  No meningeal signs.   Cardiovascular: Normal rate, regular rhythm. Good peripheral circulation. Grossly normal heart sounds. Respiratory: Normal respiratory effort.  No retractions. Lungs CTAB. Gastrointestinal: Soft and nontender. No distention.  Musculoskeletal: No lower extremity tenderness nor edema. No gross deformities of extremities. Neurologic:  Normal speech and language. No gross focal neurologic deficits are appreciated.  Skin:  Skin is warm, dry and intact. No rash noted. Psychiatric: Mood and affect are normal. Speech and behavior are normal.  ____________________________________________   LABS (all labs ordered are listed, but only abnormal results are displayed)  Labs Reviewed  CBC - Abnormal; Notable for the following components:      Result Value   Hemoglobin 11.7 (*)    HCT 35.6 (*)    All other components within normal limits  COMPREHENSIVE METABOLIC PANEL - Abnormal; Notable for the following components:   Calcium 8.7 (*)    All other  components within normal limits  TROPONIN I   ____________________________________________  EKG  ED ECG REPORT I, Loleta Roseory Michille Mcelrath, the attending physician, personally viewed and interpreted this ECG.  Date: 05/31/2018 EKG Time: 23: 13 Rate: 63 Rhythm: normal sinus rhythm QRS Axis: normal Intervals: normal ST/T Wave abnormalities: normal Narrative Interpretation: no evidence of acute ischemia  ____________________________________________  RADIOLOGY I, Loleta Roseory Casimer Russett, personally viewed and evaluated these images (plain radiographs) as part of my medical decision making, as well as reviewing the written report by the radiologist.  ED MD interpretation: No indication of acute abnormality on chest x-ray  Official radiology report(s): Dg Chest 2 View  Result Date: 05/31/2018 CLINICAL DATA:  Palpitations EXAM: CHEST - 2 VIEW COMPARISON:  03/31/2017 FINDINGS: Heart and mediastinal contours are within normal limits. No focal opacities or effusions. No acute bony abnormality. IMPRESSION: No active cardiopulmonary disease. Electronically Signed   By: Charlett NoseKevin  Dover M.D.   On: 05/31/2018 23:34    ____________________________________________   PROCEDURES  Critical Care performed: No   Procedure(s) performed:   Procedures   ____________________________________________   INITIAL IMPRESSION / ASSESSMENT AND PLAN / ED COURSE  As part of my medical decision making, I reviewed the following data within the electronic MEDICAL RECORD NUMBER Nursing notes reviewed and incorporated, Labs reviewed , EKG interpreted , Old chart reviewed, Radiograph reviewed  and Notes from prior ED visits    Differential diagnosis includes, but is not limited to, sleep apnea, viral infection, community-acquired pneumonia, less likely PE or ACS.  Her vital signs are stable with no tachycardia nor hypoxemia, chest x-ray is clear, EKG is normal, all of her labs are reassuring and within normal limits.  It is unclear  whether or not the Nexplanon counts as "exogenous estrogen", although if it did not she would be PERC negative.  However, her well score for PE is 0 and I do not think she would benefit from additional work-up at this time.  Given her history of previously diagnosed sleep apnea which is not treated and the fact that her shortness of breath occurred while she was falling asleep on her back  and then resolved shortly after she became awake, I think it is very likely that this was due to sleep apnea.  We talked about this and she is comfortable with the plan for discharge and outpatient follow-up.  She is low risk for ACS based on HEART score.  I gave my usual and customary return precautions.     ____________________________________________  FINAL CLINICAL IMPRESSION(S) / ED DIAGNOSES  Final diagnoses:  SOB (shortness of breath)     MEDICATIONS GIVEN DURING THIS VISIT:  Medications - No data to display   ED Discharge Orders    None       Note:  This document was prepared using Dragon voice recognition software and may include unintentional dictation errors.   Loleta Rose, MD 06/01/18 [phone]

## 2018-06-01 NOTE — ED Notes (Signed)
Pt reports waking up tonight with a sensation that she stopped breathing.  No chest pain    Pt reports intermittent sob.  Pt denies n/v.   Pt denies sob at this time.  No cough  Non smoker.  Pt alert   Family with pt

## 2018-06-02 ENCOUNTER — Encounter: Payer: Self-pay | Admitting: Nurse Practitioner

## 2018-06-02 ENCOUNTER — Ambulatory Visit (INDEPENDENT_AMBULATORY_CARE_PROVIDER_SITE_OTHER): Payer: Managed Care, Other (non HMO) | Admitting: Nurse Practitioner

## 2018-06-02 VITALS — BP 122/88 | HR 77 | Temp 98.3°F | Resp 16 | Ht 63.0 in | Wt 218.5 lb

## 2018-06-02 DIAGNOSIS — Z820 Family history of epilepsy and other diseases of the nervous system: Secondary | ICD-10-CM | POA: Diagnosis not present

## 2018-06-02 DIAGNOSIS — G4730 Sleep apnea, unspecified: Secondary | ICD-10-CM | POA: Diagnosis not present

## 2018-06-02 DIAGNOSIS — R5383 Other fatigue: Secondary | ICD-10-CM | POA: Diagnosis not present

## 2018-06-02 DIAGNOSIS — D649 Anemia, unspecified: Secondary | ICD-10-CM | POA: Diagnosis not present

## 2018-06-02 DIAGNOSIS — L249 Irritant contact dermatitis, unspecified cause: Secondary | ICD-10-CM

## 2018-06-02 DIAGNOSIS — E0590 Thyrotoxicosis, unspecified without thyrotoxic crisis or storm: Secondary | ICD-10-CM

## 2018-06-02 NOTE — Progress Notes (Signed)
Name: Catherine Berg   MRN: [id number]    DOB: [birth year]   Date:06/02/2018       Progress Note  Subjective  Chief Complaint  Chief Complaint  Patient presents with  . Shortness of Breath    patient was seen at the ER on Tuesday night - was told it was sleep apnea  . Fatigue    patient feels very tired.  . Rash    red marks on both arms, predominant on right arm. no itching or burning.    HPI  Patient was seen in ER yesterday after waking up from sleep gasping for air and felt she had stopped breathing in her sleep. Patient noted a short period of shortness of breath and palpitations. EKG was NSR rate of 63, CBC showed mild anemia, calcium was 8.7. sugar, other electrolytes, kidney, liver, WBC and platelets within normal ranges. Chest x-ray showed no active cardiopulmonary disease. Has not had an episode like that since or before.   Patient had sleep study test about 5 years ago but was negative then. Never been told she snores, states has lost some weight in the last 5 years- states was on blood pressure medications but came off them.   Patient notes intermittent fatigue, though she is getting rest at home.  Does take naps during the day.  Has hypothyroidism has concerned this may be related to her thyroid.  States has not had a.  Since being on birth control, denies chest pain, exertional shortness of breath, dizziness.  Patient notes rash in anterior bilateral arms after ER visit, not itchy, mildly tender with deep palpation of area.  No fevers or chills Results of the Epworth flowsheet 06/02/2018  Sitting and reading 1  Watching TV 1  Sitting, inactive in a public place (e.g. a theatre or a meeting) 0  As a passenger in a car for an hour without a break 0  Lying down to rest in the afternoon when circumstances permit 2  Sitting and talking to someone 0  Sitting quietly after a lunch without alcohol 1  In a car, while stopped for a few minutes in traffic 0  Total score 39      Patient Active Problem List   Diagnosis Date Noted  . Preventative health care 08/03/2017  . Screen for STD (sexually transmitted disease) 08/03/2017  . Bilateral leg edema 04/20/2017  . Incidental lung nodule, > 59mm and < 110mm 03/31/2017  . Abnormal first trimester screen   . Supervision of high-risk pregnancy of elderly multigravida (>= 37 years old at time of delivery), first trimester 07/22/2016  . Previous cesarean delivery, delivered 06/04/2016  . Renal insufficiency 04/23/2016  . 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 Postcoital bleeding 03/04/2016  . Genital sore 01/08/2016  . Fatigue 12/22/2015  . Constipation 11/07/2015  . Hives 09/02/2015  . Obesity 08/15/2015  . Anxiety 07/09/2015  . 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 Allergic reaction 05/07/2015  . Thyroid nodule 02/01/2015  . Hyperthyroidism 12/25/2014  . Vitamin D deficiency disease   . Vitamin B12 deficiency   . History of abnormal cervical Pap smear   . Allergy-induced asthma, mild intermittent, uncomplicated 10/09/2013    Past Medical History:  Diagnosis Date  . Abnormal thyroid blood test   . Anxiety   . 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 Graves disease   . History of abnormal cervical Pap smear   . History of cervical polypectomy   . Hives 09/02/2015  .  Hypertension   . IFG (impaired fasting glucose)   . Incidental lung nodule, > 3mm and < 8mm 03/31/2017   4 mm RLL lung nodule on chest CT Mar 31, 2017  . Low serum vitamin D   . Obesity   . Pregnancy induced hypertension    with 1st pregnancy, normal pressure with 2nd  . Reflux   . Thyroid disease   . Vitamin B12 deficiency   . Vitamin D deficiency disease     Past Surgical History:  Procedure Laterality Date  . CERVICAL POLYPECTOMY    . CESAREAN SECTION     x 2    Social History   Tobacco Use  . Smoking status: Never Smoker  . Smokeless tobacco: Never Used  Substance Use Topics  . Alcohol use: No     Alcohol/week: 0.0 standard drinks     Current Outpatient Medications:  .  albuterol (PROVENTIL HFA;VENTOLIN HFA) 108 (90 Base) MCG/ACT inhaler, Inhale 2 puffs into the lungs every 6 (six) hours as needed for wheezing or shortness of breath., Disp: , Rfl:  .  budesonide-formoterol (SYMBICORT) 80-4.5 MCG/ACT inhaler, Inhale 2 puffs into the lungs 2 (two) times daily. This replaces Breo, Disp: 1 Inhaler, Rfl: 12 .  Elastic Bandages & Supports (MEDICAL COMPRESSION STOCKINGS) MISC, Wear from morning to night, but do not sleep in these, Disp: 1 each, Rfl: 2 .  fluticasone (FLONASE) 50 MCG/ACT nasal spray, Place 2 sprays into both nostrils daily., Disp: 16 g, Rfl: 12 .  hydrOXYzine (ATARAX/VISTARIL) 25 MG tablet, Take 1 tablet (25 mg total) by mouth 3 (three) times daily as needed., Disp: 30 tablet, Rfl: 2 .  meloxicam (MOBIC) 7.5 MG tablet, Take 1 tablet (7.5 mg total) by mouth daily. For the first 7-10 days, then just daily if needed, Disp: 30 tablet, Rfl: 0 .  sertraline (ZOLOFT) 50 MG tablet, Take 1 tablet (50 mg total) by mouth daily., Disp: 30 tablet, Rfl: 3 .  vitamin B-12 (CYANOCOBALAMIN) 500 MCG tablet, Take 1 tablet (500 mcg total) by mouth daily., Disp: 100 tablet, Rfl: 1 .  VITAMIN D PO, Take by mouth., Disp: , Rfl:   Allergies  Allergen Reactions  . Latex Hives  . Shellfish Allergy Hives    ROS   No other specific complaints in a complete review of systems (except as listed in HPI above).  Objective  Vitals:   06/02/18 0844  BP: 122/88  Pulse: 77  Resp: 16  Temp: 98.3 F (36.8 C)  TempSrc: Oral  SpO2: 99%  Weight: 218 lb 8 oz (99.1 kg)  Height: 5\' 3"  (1.6 m)    Body mass index is 38.71 kg/m.  Nursing Note and Vital Signs reviewed.  Physical Exam Constitutional:      Appearance: Normal appearance. She is well-developed.  HENT:     Head: Normocephalic and atraumatic.     Right Ear: Hearing normal.     Left Ear: Hearing normal.  Eyes:     Conjunctiva/sclera:  Conjunctivae normal.  Cardiovascular:     Rate and Rhythm: Normal rate and regular rhythm.     Pulses: Normal pulses.     Heart sounds: Normal heart sounds.  Pulmonary:     Effort: Pulmonary effort is normal.     Breath sounds: Normal breath sounds. No stridor or decreased air movement. No decreased breath sounds, wheezing, rhonchi or rales.  Chest:     Chest wall: No tenderness.  Musculoskeletal: Normal range of motion.  Skin:  Neurological:     Mental Status: She is alert and oriented to person, place, and time.  Psychiatric:        Speech: Speech normal.        Behavior: Behavior normal. Behavior is cooperative.        Thought Content: Thought content normal.        Judgment: Judgment normal.       Results for orders placed or performed during the hospital encounter of 06/01/18 (from the past 48 hour(s))  CBC     Status: Abnormal   Collection Time: 05/31/18 11:32 PM  Result Value Ref Range   WBC 7.6 4.0 - 10.5 K/uL   RBC 4.11 3.87 - 5.11 MIL/uL   Hemoglobin 11.7 (L) 12.0 - 15.0 g/dL   HCT 29.5 (L) 62.1 - 30.8 %   MCV 86.6 80.0 - 100.0 fL   MCH 28.5 26.0 - 34.0 pg   MCHC 32.9 30.0 - 36.0 g/dL   RDW 65.7 84.6 - 96.2 %   Platelets 294 150 - 400 K/uL   nRBC 0.0 0.0 - 0.2 %    Comment: Performed at [facility], [address] [place], [place] [address]  Comprehensive metabolic panel     Status: Abnormal   Collection Time: 05/31/18 11:32 PM  Result Value Ref Range   Sodium 137 135 - 145 mmol/L   Potassium 3.5 3.5 - 5.1 mmol/L   Chloride 107 98 - 111 mmol/L   CO2 24 22 - 32 mmol/L   Glucose, Bld 93 70 - 99 mg/dL   BUN 16 6 - 20 mg/dL   Creatinine, Ser 1.32 0.44 - 1.00 mg/dL   Calcium 8.7 (L) 8.9 - 10.3 mg/dL   Total Protein 7.5 6.5 - 8.1 g/dL   Albumin 4.0 3.5 - 5.0 g/dL   AST 22 15 - 41 U/L   ALT 20 0 - 44 U/L   Alkaline Phosphatase 60 38 - 126 U/L   Total Bilirubin 0.7 0.3 - 1.2 mg/dL   GFR calc non Af Amer >60 >60 mL/min   GFR calc Af Amer >60  >60 mL/min   Anion gap 6 5 - 15    Comment: Performed at [facility], [address] [place], [place] [address]  Troponin I - ONCE - STAT     Status: None   Collection Time: 05/31/18 11:32 PM  Result Value Ref Range   Troponin I <0.03 <0.03 ng/mL    Comment: Performed at [facility], [address] [place], [place] [address]    Assessment & Plan  1. Sleep apnea in adult Patient endorses an apneic episode in her sleep 2 nights ago.  Will evaluate for sleep apnea.  Encouraged weight loss, sleeping on her side.  She does not smoke or drink alcohol or use sedatives. - Ambulatory referral to Pulmonology  2. FH: sleep apnea - Ambulatory referral to Pulmonology  3. Other fatigue May be related to mild anemia, thyroid changes, sleep apnea.  Discussed hydration, good nutrition, sleep hygiene. - Ambulatory referral to Pulmonology - TSH  4. Anemia, unspecified type Increase iron intake in diet. - Fe+TIBC+Fer  5. Hyperthyroidism - TSH  6. Irritant contact dermatitis, unspecified trigger Due to unusual shapes likely some irritant from ER visit.  Discussed monitor it does not resolve in the next few weeks or at any point if it continues to increase or grow please come in for further evaluation.  Reassurance provided    -Red flags and  when to present for emergency care or RTC including fever >101.37F, chest pain, shortness of breath, new/worsening/un-resolving symptoms, reviewed with patient at time of visit. Follow up and care instructions discussed and provided in AVS.

## 2018-06-02 NOTE — Patient Instructions (Addendum)
High iron Foods include:Animal- Chicken liver,Oysters, Clams, Beef liver, Beef (chuck roast, lean ground beef), [place] leg, Tuna Eggs Shrimp Leg of lamb Plant- Raisin bran (enriched), Instant oatmeal,Beans (kidney, lima, Navy),Tofu, Lentils, Molasses, Spinach, Whole wheat bread, Peanut butter, Brown rice  Sleep Apnea Sleep apnea affects breathing during sleep. It causes breathing to stop for a short time or to become shallow. It can also increase the risk of:  Heart attack.  Stroke.  Being very overweight (obese).  Diabetes.  Heart failure.  Irregular heartbeat. The goal of treatment is to help you breathe normally again. What are the causes? There are three kinds of sleep apnea:  Obstructive sleep apnea. This is caused by a blocked or collapsed airway.  Central sleep apnea. This happens when the brain does not send the right signals to the muscles that control breathing.  Mixed sleep apnea. This is a combination of obstructive and central sleep apnea. The most common cause of this condition is a collapsed or blocked airway. This can happen if:  Your throat muscles are too relaxed.  Your tongue and tonsils are too large.  You are overweight.  Your airway is too small. What increases the risk?  Being overweight.  Smoking.  Having a small airway.  Being older.  Being female.  Drinking alcohol.  Taking medicines to calm yourself (sedatives or tranquilizers).  Having family members with the condition. What are the signs or symptoms?  Trouble staying asleep.  Being sleepy or tired during the day.  Getting angry a lot.  Loud snoring.  Headaches in the morning.  Not being able to focus your mind (concentrate).  Forgetting things.  Less interest in sex.  Mood swings.  Personality changes.  Feelings of sadness (depression).  Waking up a lot during the night to pee (urinate).  Dry mouth.  Sore throat. How is this diagnosed?  Your medical  history.  A physical exam.  A test that is done when you are sleeping (sleep study). The test is most often done in a sleep lab but may also be done at home. How is this treated?  Sleeping on your side.  Using a medicine to get rid of mucus in your nose (decongestant).  Avoiding the use of alcohol, medicines to help you relax, or certain pain medicines (narcotics).  Losing weight, if needed.  Changing your diet.  Not smoking.  Using a machine to open your airway while you sleep, such as: ? An oral appliance. This is a mouthpiece that shifts your lower jaw forward. ? A CPAP device. This device blows air through a mask when you breathe out (exhale). ? An EPAP device. This has valves that you put in each nostril. ? A BPAP device. This device blows air through a mask when you breathe in (inhale) and breathe out.  Having surgery if other treatments do not work. It is important to get treatment for sleep apnea. Without treatment, it can lead to:  High blood pressure.  Coronary artery disease.  In men, not being able to have an erection (impotence).  Reduced thinking ability. Follow these instructions at home: Lifestyle  Make changes that your doctor recommends.  Eat a healthy diet.  Lose weight if needed.  Avoid alcohol, medicines to help you relax, and some pain medicines.  Do not use any products that contain nicotine or tobacco, such as cigarettes, e-cigarettes, and chewing tobacco. If you need help quitting, ask your doctor. General instructions  Take over-the-counter and prescription medicines only  as told by your doctor.  If you were given a machine to use while you sleep, use it only as told by your doctor.  If you are having surgery, make sure to tell your doctor you have sleep apnea. You may need to bring your device with you.  Keep all follow-up visits as told by your doctor. This is important. Contact a doctor if:  The machine that you were given to use  during sleep bothers you or does not seem to be working.  You do not get better.  You get worse. Get help right away if:  Your chest hurts.  You have trouble breathing in enough air.  You have an uncomfortable feeling in your back, arms, or stomach.  You have trouble talking.  One side of your body feels weak.  A part of your face is hanging down. These symptoms may be an emergency. Do not wait to see if the symptoms will go away. Get medical help right away. Call your local emergency services (911 in the [place] Do not drive yourself to the hospital. Summary  This condition affects breathing during sleep.  The most common cause is a collapsed or blocked airway.  The goal of treatment is to help you breathe normally while you sleep. This information is not intended to replace advice given to you by your health care provider. Make sure you discuss any questions you have with your health care provider. Document Released: 01/28/2008 Document Revised: 12/14/2017 Document Reviewed: 12/14/2017 Elsevier Interactive Patient Education  [employer].

## 2018-06-03 LAB — IRON,TIBC AND FERRITIN PANEL
%SAT: 12 % (calc) — ABNORMAL LOW (ref 16–45)
Ferritin: 29 ng/mL (ref 16–154)
Iron: 38 ug/dL — ABNORMAL LOW (ref 40–190)
TIBC: 312 mcg/dL (calc) (ref 250–450)

## 2018-06-03 LAB — TSH: TSH: 4.06 mIU/L

## 2018-06-22 ENCOUNTER — Ambulatory Visit (INDEPENDENT_AMBULATORY_CARE_PROVIDER_SITE_OTHER): Payer: Managed Care, Other (non HMO) | Admitting: Internal Medicine

## 2018-06-22 ENCOUNTER — Encounter: Payer: Self-pay | Admitting: Internal Medicine

## 2018-06-22 VITALS — BP 112/74 | HR 71 | Resp 16 | Ht 63.0 in | Wt 217.0 lb

## 2018-06-22 DIAGNOSIS — G4719 Other hypersomnia: Secondary | ICD-10-CM

## 2018-06-22 DIAGNOSIS — J4540 Moderate persistent asthma, uncomplicated: Secondary | ICD-10-CM

## 2018-06-22 MED ORDER — MONTELUKAST SODIUM 10 MG PO TABS: 10.0000 mg | ORAL_TABLET | Freq: Every day | ORAL | 3 refills | Status: DC

## 2018-06-22 NOTE — Progress Notes (Signed)
[name] Consultation      Assessment and Plan:  Excessive daytime sleepiness. - Symptoms and signs of obstructive sleep apnea. - We will send for sleep study, start on CPAP as indicated.  Asthma with dyspnea. - History of asthma, has responded well to Singulair in the past.  Currently is using albuterol about twice per week, she has Symbicort at home but declines to use it for unknown reasons. - Singulair was prescribed today, asked to use it at night and continue to use albuterol during the day.  I advised her that if she is using her albuterol 3 days/week or more she should start using Symbicort 2 puffs twice daily in addition to that.  Orders Placed This Encounter  Procedures  . Home sleep test   Meds ordered this encounter  Medications  . montelukast (SINGULAIR) 10 MG tablet    Sig: Take 1 tablet (10 mg total) by mouth at bedtime.    Dispense:  30 tablet    Refill:  3  . montelukast (SINGULAIR) 10 MG tablet    Sig: Take 1 tablet (10 mg total) by mouth at bedtime.    Dispense:  30 tablet    Refill:  3   Return in about 3 months (around 09/20/2018).    Date: 06/22/2018  MRN# [id number] Catherine Berg 07/12/1980  Referring Physician: Referred by Dr. Sherie DonLada.   Catherine Berg is a 38 y.o. old female seen in consultation for chief complaint of:    Chief Complaint  Patient presents with  . Consult    referred by for eval of OSA:  . Apnea    pt states she feel asleep and it felt like she stopped breathing.    HPI:   She has been tested for OSA in the past but it was negative, that was 5-10 yrs ago. She found that she stopped breathing and was panicky, she then went to the ED and it was thought that she might have OSA.  She is does not fall asleep during the day.  No one in the family with OSA. No sleep walking, denies sleep paralysis, no cataplexy. Denies jaw pain, no TMJ, she has upper dentures.  She has a history of htn, now controlled without  meds.   She notes that she has mild dyspnea when talking and sometimes with activity. She sings in choir and notices that sometimes she does not have enough air. She has asthma as a child which went away. She has an inhaler, proventil, which she uses twice per week. She has symbicort but she does not use it. She has not tried using it regularly. She was seen by Dr. Meredeth IdeFleming in 2015, notes were reviewed. She was recommend to use singulair, advair, and do alpha-1 (results not visible). She did feel that the singulair helped.   She has a dog, not in bedroom. She has been tested for allergies in the past not certain of results.  She takes hydroxyzine for hives.   PMHX:   Past Medical History:  Diagnosis Date  . Abnormal thyroid blood test   . Anxiety   . 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 Graves disease   . History of abnormal cervical Pap smear   . History of cervical polypectomy   . Hives 09/02/2015  . Hypertension   . IFG (impaired fasting glucose)   . Incidental lung nodule, > 3mm and < 8mm 03/31/2017   4 mm RLL lung nodule  on chest CT Mar 31, 2017  . Low serum vitamin D   . Obesity   . Pregnancy induced hypertension    with 1st pregnancy, normal pressure with 2nd  . Reflux   . Thyroid disease   . Vitamin B12 deficiency   . Vitamin D deficiency disease    Surgical Hx:  Past Surgical History:  Procedure Laterality Date  . CERVICAL POLYPECTOMY    . CESAREAN SECTION     x 2   Family Hx:  Family History  Problem Relation Age of Onset  . Heart attack Mother 23  . Hypertension Mother   . Asthma Mother   . Cancer Mother        laryngeal  . Diabetes Mother        pre diabetic  . Heart disease Mother   . Mental illness Mother   . Alcohol abuse Mother   . Drug abuse Mother   . Depression Mother   . Anxiety disorder Mother   . Bipolar disorder Mother   . Learning disabilities Brother   . ADD / ADHD Brother   . Asthma Son   . Hyperlipidemia Brother    . Alcohol abuse Father   . Heart disease Maternal Grandmother        heart attack, pacemaker  . Heart attack Maternal Grandmother   . Hypertension Maternal Grandmother   . Hypertension Maternal Grandfather   . Heart disease Paternal Grandmother        couple of major open heart surgeries, leaking valves  . Hypertension Paternal Grandmother   . Hypertension Paternal Grandfather    Social Hx:   Social History   Tobacco Use  . Smoking status: Never Smoker  . Smokeless tobacco: Never Used  Substance Use Topics  . Alcohol use: No    Alcohol/week: 0.0 standard drinks  . Drug use: No   Medication:    Current Outpatient Medications:  .  albuterol (PROVENTIL HFA;VENTOLIN HFA) 108 (90 Base) MCG/ACT inhaler, Inhale 2 puffs into the lungs every 6 (six) hours as needed for wheezing or shortness of breath., Disp: , Rfl:  .  budesonide-formoterol (SYMBICORT) 80-4.5 MCG/ACT inhaler, Inhale 2 puffs into the lungs 2 (two) times daily. This replaces Breo, Disp: 1 Inhaler, Rfl: 12 .  Elastic Bandages & Supports (MEDICAL COMPRESSION STOCKINGS) MISC, Wear from morning to night, but do not sleep in these, Disp: 1 each, Rfl: 2 .  fluticasone (FLONASE) 50 MCG/ACT nasal spray, Place 2 sprays into both nostrils daily., Disp: 16 g, Rfl: 12 .  hydrOXYzine (ATARAX/VISTARIL) 25 MG tablet, Take 1 tablet (25 mg total) by mouth 3 (three) times daily as needed., Disp: 30 tablet, Rfl: 2 .  meloxicam (MOBIC) 7.5 MG tablet, Take 1 tablet (7.5 mg total) by mouth daily. For the first 7-10 days, then just daily if needed, Disp: 30 tablet, Rfl: 0 .  sertraline (ZOLOFT) 50 MG tablet, Take 1 tablet (50 mg total) by mouth daily., Disp: 30 tablet, Rfl: 3 .  vitamin B-12 (CYANOCOBALAMIN) 500 MCG tablet, Take 1 tablet (500 mcg total) by mouth daily., Disp: 100 tablet, Rfl: 1 .  VITAMIN D PO, Take by mouth., Disp: , Rfl:    Allergies:  Latex and Shellfish allergy  Review of Systems: Gen:  Denies  fever, sweats,  chills HEENT: Denies blurred vision, double vision. bleeds, sore throat Cvc:  No dizziness, chest pain. Resp:   Denies cough or sputum production, shortness of breath Gi: Denies swallowing difficulty, stomach pain. Gu:  Denies bladder incontinence, burning urine Ext:   No Joint pain, stiffness. Skin: No skin rash,  hives  Endoc:  No polyuria, polydipsia. Psych: No depression, insomnia. Other:  All other systems were reviewed with the patient and were negative other that what is mentioned in the HPI.   Physical Examination:   VS: BP 112/74 (BP Location: Left Arm, Cuff Size: Large)   Pulse 71   Resp 16   Ht 5\' 3"  (1.6 m)   Wt 217 lb (98.4 kg)   SpO2 100%   BMI 38.44 kg/m   General Appearance: No distress  Neuro:without focal findings,  speech normal,  HEENT: PERRLA, EOM intact.  Mallampati 3. Pulmonary: normal breath sounds, No wheezing.  CardiovascularNormal S1,S2.  No m/r/g.   Abdomen: Benign, Soft, non-tender. Renal:  No costovertebral tenderness  GU:  No performed at this time. Endoc: No evident thyromegaly, no signs of acromegaly. Skin:   warm, no rashes, no ecchymosis  Extremities: normal, no cyanosis, clubbing.  Other findings:    LABORATORY PANEL:   CBC No results for input(s): WBC, HGB, HCT, PLT in the last 168 hours. ------------------------------------------------------------------------------------------------------------------  Chemistries  No results for input(s): NA, K, CL, CO2, GLUCOSE, BUN, CREATININE, CALCIUM, MG, AST, ALT, ALKPHOS, BILITOT in the last 168 hours.  Invalid input(s): GFRCGP ------------------------------------------------------------------------------------------------------------------  Cardiac Enzymes No results for input(s): TROPONINI in the last 168 hours. ------------------------------------------------------------  RADIOLOGY:  No results found.     Thank  you for the consultation and for allowing [facility]  [facility] to assist in the care of your patient. Our recommendations are noted above.  Please contact us if we can be of further service.   Wells Guiles, M.D., F.C.C.P.  Board Certified in Internal Medicine, Pulmonary Medicine, Critical Care Medicine, and Sleep Medicine.  [facility] Office Number: [phone]   06/22/2018

## 2018-06-22 NOTE — Patient Instructions (Addendum)
Start singulair (montelukast) once nightly.  Use albuterol as needed. If you need to use it 3 or more days per week, start symbicort 2 puffs twice daily.   Will send for sleep study.   Sleep Apnea    Sleep apnea is disorder that affects a person's sleep. A person with sleep apnea has abnormal pauses in their breathing when they sleep. It is hard for them to get a good sleep. This makes a person tired during the day. It also can lead to other physical problems. There are three types of sleep apnea. One type is when breathing stops for a short time because your airway is blocked (obstructive sleep apnea). Another type is when the brain sometimes fails to give the normal signal to breathe to the muscles that control your breathing (central sleep apnea). The third type is a combination of the other two types.  HOME CARE   Take all medicine as told by your doctor.  Avoid alcohol, calming medicines (sedatives), and depressant drugs.  Try to lose weight if you are overweight. Talk to your doctor about a healthy weight goal.  Your doctor may have you use a device that helps to open your airway. It can help you get the air that you need. It is called a positive airway pressure (PAP) device.   MAKE SURE YOU:   Understand these instructions.  Will watch your condition.  Will get help right away if you are not doing well or get worse.  It may take approximately 1 month for you to get used to wearing her CPAP every night.  Be sure to work with your machine to get used to it, be patient, it may take time!  If you have trouble tolerating CPAP DO NOT RETURN YOUR MACHINE; Contact our office to see if we can help you tolerate the CPAP better first!

## 2018-06-28 ENCOUNTER — Telehealth: Payer: Self-pay

## 2018-06-28 MED ORDER — ALBUTEROL SULFATE HFA 108 (90 BASE) MCG/ACT IN AERS: 2.0000 | INHALATION_SPRAY | Freq: Four times a day (QID) | RESPIRATORY_TRACT | 1 refills | Status: DC | PRN

## 2018-06-28 NOTE — Addendum Note (Signed)
Addended by: Erlinda Hong on: 06/28/2018 11:34 AM   Modules accepted: Orders

## 2018-06-28 NOTE — Addendum Note (Signed)
Addended by: Erlinda Hong on: 06/28/2018 12:11 PM   Modules accepted: Orders

## 2018-06-28 NOTE — Telephone Encounter (Signed)
She told that she takes albuterol a few times per week. So I started her on singulair. I asked her to call if she is still using albuterol a few times per week after starting the singulair as we will need to start her on symbicort also.

## 2018-06-28 NOTE — Telephone Encounter (Signed)
Received a call from [facility] pharmacy regarding prescriptions. Patient stated she thought she was picking up an inhaler. Per Ram's note, she is to continue Albuterol and then if that doesn't control her symptoms, start symbicort. Pharmacy states they do not have albuterol on file for her. Will confirm with Dr. Nicholos Johns and order accordingly.

## 2018-06-28 NOTE — Telephone Encounter (Signed)
We have sent in rx for albuterol, if patient needs this more than a few times a week, she is to call and let us know. We will send in Symbicort also. Patient aware. Will send rx of albuterol to [facility] per patient's request.

## 2018-07-06 DIAGNOSIS — G4710 Hypersomnia, unspecified: Secondary | ICD-10-CM

## 2018-07-08 ENCOUNTER — Telehealth: Payer: Self-pay | Admitting: Internal Medicine

## 2018-07-08 DIAGNOSIS — G4719 Other hypersomnia: Secondary | ICD-10-CM

## 2018-07-08 DIAGNOSIS — G4710 Hypersomnia, unspecified: Secondary | ICD-10-CM

## 2018-07-08 NOTE — Telephone Encounter (Signed)
Sleep study performed on 07/05/18, showed AHI of 1.4. Recommend follow up. If OSA is still strongly suspected, an in-lab sleep study can be considered.  Pt has pending OV for 09/30/18. Pt stated that she would call for sooner appointment if she develops any new sx or worsen sx.  Nothing further is needed.

## 2018-07-26 ENCOUNTER — Telehealth: Payer: Self-pay | Admitting: *Deleted

## 2018-07-26 NOTE — Telephone Encounter (Signed)
   Cardiac Questionnaire:    Since your last visit or hospitalization:    1. Have you been having new or worsening chest pain? NO   2. Have you been having new or worsening shortness of breath? NO 3. Have you been having new or worsening leg swelling, wt gain, or increase in abdominal girth (pants fitting more tightly)? NO   4. Have you had any passing out spells? NO    *A YES to any of these questions would result in the appointment being kept. *If all the answers to these questions are NO, we should indicate that given the current situation regarding the worldwide coronarvirus pandemic, at the recommendation of the CDC, we are looking to limit gatherings in our waiting area, and thus will reschedule their appointment beyond four weeks from today.   _____________      Primary Cardiologist:  DR Cristal Deer END   Patient contacted.  History reviewed.  No symptoms to suggest any unstable cardiac conditions.  Based on discussion, with current pandemic situation, we will be postponing this appointment for Catherine Berg with a plan for f/u in 12 wks or sooner if feasible/necessary.  If symptoms change, she has been instructed to contact our office.   PATIENT RESCHEDULED FOR June AND VERBALIZED UNDERSTANDING FOR appointment DATE AND TIME.   Catalina Gravel, RN  07/26/2018 11:06 AM         .

## 2018-07-29 ENCOUNTER — Ambulatory Visit: Payer: Self-pay | Admitting: Internal Medicine

## 2018-08-19 ENCOUNTER — Encounter: Payer: Self-pay | Admitting: Family Medicine

## 2018-08-19 ENCOUNTER — Telehealth: Payer: Managed Care, Other (non HMO) | Admitting: Family

## 2018-08-19 DIAGNOSIS — R300 Dysuria: Secondary | ICD-10-CM

## 2018-08-19 MED ORDER — NITROFURANTOIN MONOHYD MACRO 100 MG PO CAPS: 100.0000 mg | ORAL_CAPSULE | Freq: Two times a day (BID) | ORAL | 0 refills | Status: DC

## 2018-08-19 MED ORDER — FLUCONAZOLE 150 MG PO TABS: 150.0000 mg | ORAL_TABLET | Freq: Every day | ORAL | 0 refills | Status: DC

## 2018-08-19 NOTE — Telephone Encounter (Signed)
Please respond to patient, let her know how to do telehealth or e-visit through MyChart please

## 2018-08-19 NOTE — Progress Notes (Signed)
We are sorry that you are not feeling well.  Here is how we plan to help!  Based on what you shared with me it looks like you most likely have a simple urinary tract infection.  A UTI (Urinary Tract Infection) is a bacterial infection of the bladder.  Most cases of urinary tract infections are simple to treat but a key part of your care is to encourage you to drink plenty of fluids and watch your symptoms carefully.  I have prescribed MacroBid 100 mg twice a day for 5 days. I also sent in Diflucan to help with yeast/itching. Your symptoms should gradually improve. Call us if the burning in your urine worsens, you develop worsening fever, back pain or pelvic pain or if your symptoms do not resolve after completing the antibiotic.  Urinary tract infections can be prevented by drinking plenty of water to keep your body hydrated.  Also be sure when you wipe, wipe from front to back and don't hold it in!  If possible, empty your bladder every 4 hours.  Your e-visit answers were reviewed by a board certified advanced clinical practitioner to complete your personal care plan.  Depending on the condition, your plan could have included both over the counter or prescription medications.  If there is a problem please reply  once you have received a response from your provider.  Your safety is important to [place].  If you have drug allergies check your prescription carefully.    You can use MyChart to ask questions about today's visit, request a non-urgent call back, or ask for a work or school excuse for 24 hours related to this e-Visit. If it has been greater than 24 hours you will need to follow up with your provider, or enter a new e-Visit to address those concerns.   You will get an e-mail in the next two days asking about your experience.  I hope that your e-visit has been valuable and will speed your recovery. Thank you for using e-visits.

## 2018-09-12 ENCOUNTER — Encounter: Payer: Self-pay | Admitting: Family Medicine

## 2018-09-14 ENCOUNTER — Ambulatory Visit (INDEPENDENT_AMBULATORY_CARE_PROVIDER_SITE_OTHER): Payer: Managed Care, Other (non HMO) | Admitting: Family Medicine

## 2018-09-14 ENCOUNTER — Other Ambulatory Visit: Payer: Self-pay

## 2018-09-14 ENCOUNTER — Encounter: Payer: Self-pay | Admitting: Family Medicine

## 2018-09-14 VITALS — BP 120/70 | HR 99 | Temp 98.0°F | Ht 63.0 in | Wt 227.7 lb

## 2018-09-14 DIAGNOSIS — E538 Deficiency of other specified B group vitamins: Secondary | ICD-10-CM

## 2018-09-14 DIAGNOSIS — M79605 Pain in left leg: Secondary | ICD-10-CM

## 2018-09-14 DIAGNOSIS — M79604 Pain in right leg: Secondary | ICD-10-CM | POA: Diagnosis not present

## 2018-09-14 DIAGNOSIS — M2550 Pain in unspecified joint: Secondary | ICD-10-CM

## 2018-09-14 DIAGNOSIS — Z923 Personal history of irradiation: Secondary | ICD-10-CM

## 2018-09-14 DIAGNOSIS — D509 Iron deficiency anemia, unspecified: Secondary | ICD-10-CM | POA: Diagnosis not present

## 2018-09-14 DIAGNOSIS — Z8261 Family history of arthritis: Secondary | ICD-10-CM

## 2018-09-14 DIAGNOSIS — E559 Vitamin D deficiency, unspecified: Secondary | ICD-10-CM

## 2018-09-14 DIAGNOSIS — G4709 Other insomnia: Secondary | ICD-10-CM

## 2018-09-14 DIAGNOSIS — L509 Urticaria, unspecified: Secondary | ICD-10-CM

## 2018-09-14 DIAGNOSIS — Z800 Family history of malignant neoplasm of digestive organs: Secondary | ICD-10-CM

## 2018-09-14 MED ORDER — VITAMIN D (ERGOCALCIFEROL) 1.25 MG (50000 UNIT) PO CAPS: 50000.0000 [IU] | ORAL_CAPSULE | ORAL | 1 refills | Status: DC

## 2018-09-14 MED ORDER — HYDROXYZINE HCL 25 MG PO TABS: 25.0000 mg | ORAL_TABLET | Freq: Three times a day (TID) | ORAL | 1 refills | Status: DC | PRN

## 2018-09-14 MED ORDER — CYANOCOBALAMIN 1000 MCG/ML IJ SOLN
1000.0000 ug | Freq: Once | INTRAMUSCULAR | Status: AC
  Administered 2018-09-14: 1000 ug via INTRAMUSCULAR

## 2018-09-14 MED ORDER — PREGABALIN 75 MG PO CAPS: 75.0000 mg | ORAL_CAPSULE | Freq: Two times a day (BID) | ORAL | 0 refills | Status: DC

## 2018-09-14 NOTE — Patient Instructions (Signed)
OOFOS flip flops or sliders

## 2018-09-14 NOTE — Progress Notes (Signed)
Name: Catherine Berg   MRN: [id number]    DOB: 02-01-1981   Date:09/14/2018       Progress Note  Subjective  Chief Complaint  Chief Complaint  Patient presents with  . Leg Pain    bilateral leg pain    HPI  Chronic leg pain: she states symptoms has been going on for months, started around end of 2018 , initially seen by Dr. Sherie Don had a doppler US that was negative for DVT. She states pain is throbbing constant at least 6/10 but it can go up and down in intensity. She states she is not sure what aggravates. It can happen during rest of activity. She states occasionally has tingling sensation. She works at [employer]. She wears tennis shoes at work. She states in the am's she has difficulty walking secondary to pain, the pain is on the bottom of her feet when she stands up.   Hives: she needs refill of hydroxyzine  Depression: she states emotionally she is doing well but has difficulty staying asleep, it may be from pain. We will try adding lyrica for sleep and also pain on legs  Morbid obesity: she has a long history of obesity, she started to try losing weight at age 80 , weight was 232 lbs, she lost about 30 lbs with diet in the past but gained it back, she states she will try to resume calorie counting again. She was also walking 4 miles per day but not at this time  Family history of RA: mother has RA, she has noticed intermittent left ankle pain and recently also right elbow pain, we will check labs  Anemia: she does not have a cycle secondary to Nexplanon for over one year, her paternal grandfather died of colon cancer, not sure what he was when diagnosed. She denies blood in stools, but has iron deficiency anemia and we will refer her to GI  Patient Active Problem List   Diagnosis Date Noted  . Preventative health care 08/03/2017  . Screen for STD (sexually transmitted disease) 08/03/2017  . Bilateral leg edema 04/20/2017  . Incidental lung nodule,  > 27mm and < 61mm 03/31/2017  . Abnormal first trimester screen   . Supervision of high-risk pregnancy of elderly multigravida (>= 36 years old at time of delivery), first trimester 07/22/2016  . Previous cesarean delivery, delivered 06/04/2016  . Renal insufficiency 04/23/2016  . 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 Postcoital bleeding 03/04/2016  . Genital sore 01/08/2016  . Fatigue 12/22/2015  . Constipation 11/07/2015  . Hives 09/02/2015  . Obesity 08/15/2015  . Anxiety 07/09/2015  . 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 Allergic reaction 05/07/2015  . Thyroid nodule 02/01/2015  . Hyperthyroidism 12/25/2014  . Vitamin D deficiency disease   . Vitamin B12 deficiency   . History of abnormal cervical Pap smear   . Allergy-induced asthma, mild intermittent, uncomplicated 10/09/2013    Past Surgical History:  Procedure Laterality Date  . CERVICAL POLYPECTOMY    . CESAREAN SECTION     x 2    Family History  Problem Relation Age of Onset  . Heart attack Mother 22  . Hypertension Mother   . Asthma Mother   . Cancer Mother        laryngeal  . Diabetes Mother        pre diabetic  . Heart disease Mother   . Mental illness Mother   . Alcohol abuse Mother   . Drug  abuse Mother   . Depression Mother   . Anxiety disorder Mother   . Bipolar disorder Mother   . Learning disabilities Brother   . ADD / ADHD Brother   . Asthma Son   . Hyperlipidemia Brother   . Alcohol abuse Father   . Heart disease Maternal Grandmother        heart attack, pacemaker  . Heart attack Maternal Grandmother   . Hypertension Maternal Grandmother   . Hypertension Maternal Grandfather   . Heart disease Paternal Grandmother        couple of major open heart surgeries, leaking valves  . Hypertension Paternal Grandmother   . Hypertension Paternal Grandfather     Social History   Socioeconomic History  . Marital status: Single    Spouse name: Not on file  . Number of children: 2  . Years of  education: Not on file  . Highest education level: High school graduate  Occupational History  . Not on file  Social Needs  . Financial resource strain: Not hard at all  . Food insecurity:    Worry: Never true    Inability: Never true  . Transportation needs:    Medical: No    Non-medical: No  Tobacco Use  . Smoking status: Never Smoker  . Smokeless tobacco: Never Used  Substance and Sexual Activity  . Alcohol use: No    Alcohol/week: 0.0 standard drinks  . Drug use: No  . Sexual activity: Yes    Partners: Male    Birth control/protection: None  Lifestyle  . Physical activity:    Days per week: 5 days    Minutes per session: 150+ min  . Stress: Rather much  Relationships  . Social connections:    Talks on phone: More than three times a week    Gets together: More than three times a week    Attends religious service: More than 4 times per year    Active member of club or organization: Yes    Attends meetings of clubs or organizations: More than 4 times per year    Relationship status: Never married  . Intimate partner violence:    Fear of current or ex partner: No    Emotionally abused: No    Physically abused: No    Forced sexual activity: No  Other Topics Concern  . Not on file  Social History Narrative  . Not on file     Current Outpatient Medications:  .  albuterol (PROVENTIL HFA;VENTOLIN HFA) 108 (90 Base) MCG/ACT inhaler, Inhale 2 puffs into the lungs every 6 (six) hours as needed for wheezing or shortness of breath., Disp: 18 g, Rfl: 1 .  budesonide-formoterol (SYMBICORT) 80-4.5 MCG/ACT inhaler, Inhale 2 puffs into the lungs 2 (two) times daily. This replaces Breo, Disp: 1 Inhaler, Rfl: 12 .  Elastic Bandages & Supports (MEDICAL COMPRESSION STOCKINGS) MISC, Wear from morning to night, but do not sleep in these, Disp: 1 each, Rfl: 2 .  fluticasone (FLONASE) 50 MCG/ACT nasal spray, Place 2 sprays into both nostrils daily., Disp: 16 g, Rfl: 12 .  hydrOXYzine  (ATARAX/VISTARIL) 25 MG tablet, Take 1 tablet (25 mg total) by mouth 3 (three) times daily as needed., Disp: 30 tablet, Rfl: 1 .  montelukast (SINGULAIR) 10 MG tablet, Take 1 tablet (10 mg total) by mouth at bedtime., Disp: 30 tablet, Rfl: 3 .  vitamin B-12 (CYANOCOBALAMIN) 500 MCG tablet, Take 1 tablet (500 mcg total) by mouth daily., Disp: 100 tablet, Rfl:  1 .  VITAMIN D PO, Take by mouth., Disp: , Rfl:  .  pregabalin (LYRICA) 75 MG capsule, Take 1 capsule (75 mg total) by mouth 2 (two) times daily., Disp: 60 capsule, Rfl: 0 .  Vitamin D, Ergocalciferol, (DRISDOL) 1.25 MG (50000 UT) CAPS capsule, Take 1 capsule (50,000 Units total) by mouth every 7 (seven) days., Disp: 12 capsule, Rfl: 1  Allergies  Allergen Reactions  . Latex Hives  . Shellfish Allergy Hives    I personally reviewed active problem list, medication list, allergies, family history, social history with the patient/caregiver today.   ROS  Constitutional: Negative for fever or weight change.  Respiratory: Negative for cough and shortness of breath.   Cardiovascular: Negative for chest pain or palpitations.  Gastrointestinal: Negative for abdominal pain, no bowel changes.  Musculoskeletal: positive for gait problem but no  joint swelling.  Skin: Negative for rash.  Neurological: Negative for dizziness or headache.  No other specific complaints in a complete review of systems (except as listed in HPI above).  Objective  Vitals:   09/14/18 1447  BP: 120/70  Pulse: 99  Temp: 98 F (36.7 C)  TempSrc: Oral  Weight: 227 lb 11.2 oz (103.3 kg)  Height: 5\' 3"  (1.6 m)    Body mass index is 40.34 kg/m.  Physical Exam  Constitutional: Patient appears well-developed and well-nourished. Obese  No distress.  HEENT: head atraumatic, normocephalic, pupils equal and reactive to light,  neck supple, throat within normal limits Cardiovascular: Normal rate, regular rhythm and normal heart sounds.  No murmur heard. No BLE  edema. Pulmonary/Chest: Effort normal and breath sounds normal. No respiratory distress. Abdominal: Soft.  There is no tenderness. Muscular skeletal: no synovitis, pain during palpation of legs, pain during rom of right elbow and left ankle no effusion or redness.  Psychiatric: Patient has a normal mood and affect. behavior is normal. Judgment and thought content normal.  PHQ2/9: Depression screen [facility] 2/9 09/14/2018 06/02/2018 03/15/2018 08/03/2017 04/20/2017  Decreased Interest 0 0 0 0 0  Down, Depressed, Hopeless 0 2 0 0 1  PHQ - 2 Score 0 2 0 0 1  Altered sleeping 3 2 0 - -  Tired, decreased energy 3 1 0 - -  Change in appetite 0 0 0 - -  Feeling bad or failure about yourself  0 0 0 - -  Trouble concentrating 1 2 0 - -  Moving slowly or fidgety/restless 0 0 0 - -  Suicidal thoughts 0 0 0 - -  PHQ-9 Score 7 7 0 - -  Difficult doing work/chores Somewhat difficult Somewhat difficult Not difficult at all - -  Some recent data might be hidden    phq 9 is negative   Fall Risk: Fall Risk  06/02/2018 03/15/2018 08/03/2017 04/20/2017 12/18/2016  Falls in the past year? 0 1 Yes No No  Number falls in past yr: 0 1 1 - -  Injury with Fall? 0 0 Yes - -  Comment - - brusise - -     Assessment & Plan  1. Pain in both lower extremities  - pregabalin (LYRICA) 75 MG capsule; Take 1 capsule (75 mg total) by mouth 2 (two) times daily.  Dispense: 60 capsule; Refill: 0  2. Morbid obesity (HCC)  Discussed with the patient the risk posed by an increased BMI. Discussed importance of portion control, calorie counting and at least 150 minutes of physical activity weekly. Avoid sweet beverages and drink more water. Eat at least  6 servings of fruit and vegetables daily   3. Iron deficiency anemia, unspecified iron deficiency anemia type  - CBC with Differential/Platelet - Iron, TIBC and Ferritin Panel - Ambulatory referral to Gastroenterology  4. Vitamin D deficiency disease  - Vitamin D,  Ergocalciferol, (DRISDOL) 1.25 MG (50000 UT) CAPS capsule; Take 1 capsule (50,000 Units total) by mouth every 7 (seven) days.  Dispense: 12 capsule; Refill: 1  5. History of radioactive iodine thyroid ablation  Under the care of Dr. Gershon Crane  6. Vitamin B12 deficiency  - cyanocobalamin ((VITAMIN B-12)) injection 1,000 mcg  7. Hives  - hydrOXYzine (ATARAX/VISTARIL) 25 MG tablet; Take 1 tablet (25 mg total) by mouth 3 (three) times daily as needed.  Dispense: 30 tablet; Refill: 1  8. Arthralgia, unspecified joint  - Sedimentation rate - C-reactive protein - ANA,IFA RA Diag Pnl w/rflx Tit/Patn  9. Family history of rheumatoid arthritis  - Sedimentation rate - C-reactive protein - ANA,IFA RA Diag Pnl w/rflx Tit/Patn  10. Family history of colon cancer  - Ambulatory referral to Gastroenterology  11. Other insomnia  - pregabalin (LYRICA) 75 MG capsule; Take 1 capsule (75 mg total) by mouth 2 (two) times daily.  Dispense: 60 capsule; Refill: 0

## 2018-09-15 ENCOUNTER — Telehealth: Payer: Self-pay | Admitting: Internal Medicine

## 2018-09-15 LAB — CBC WITH DIFFERENTIAL/PLATELET
Absolute Monocytes: 410 cells/uL (ref 200–950)
Basophils Absolute: 43 cells/uL (ref 0–200)
Basophils Relative: 0.6 %
Eosinophils Absolute: 122 cells/uL (ref 15–500)
Eosinophils Relative: 1.7 %
HCT: 37.3 % (ref 35.0–45.0)
Hemoglobin: 12.7 g/dL (ref 11.7–15.5)
Lymphs Abs: 2484 cells/uL (ref 850–3900)
MCH: 29.2 pg (ref 27.0–33.0)
MCHC: 34 g/dL (ref 32.0–36.0)
MCV: 85.7 fL (ref 80.0–100.0)
MPV: 10 fL (ref 7.5–12.5)
Monocytes Relative: 5.7 %
Neutro Abs: 4140 cells/uL (ref 1500–7800)
Neutrophils Relative %: 57.5 %
Platelets: 327 10*3/uL (ref 140–400)
RBC: 4.35 10*6/uL (ref 3.80–5.10)
RDW: 12.7 % (ref 11.0–15.0)
Total Lymphocyte: 34.5 %
WBC: 7.2 10*3/uL (ref 3.8–10.8)

## 2018-09-15 LAB — C-REACTIVE PROTEIN: CRP: 11.3 mg/L — ABNORMAL HIGH (ref <8.0)

## 2018-09-15 LAB — IRON,TIBC AND FERRITIN PANEL
%SAT: 19 % (calc) (ref 16–45)
Ferritin: 36 ng/mL (ref 16–154)
Iron: 63 ug/dL (ref 40–190)
TIBC: 329 mcg/dL (calc) (ref 250–450)

## 2018-09-15 LAB — ANA,IFA RA DIAG PNL W/RFLX TIT/PATN
Anti Nuclear Antibody (ANA): NEGATIVE
Cyclic Citrullin Peptide Ab: <16 UNITS
Rheumatoid fact SerPl-aCnc: <14 IU/mL (ref <14)

## 2018-09-15 LAB — SEDIMENTATION RATE: Sed Rate: 19 mm/h (ref 0–20)

## 2018-09-15 NOTE — Telephone Encounter (Signed)
Virtual Visit Pre-Appointment Phone Call  "(Name), I am calling you today to discuss your upcoming appointment. We are currently trying to limit exposure to the virus that causes COVID-19 by seeing patients at home rather than in the office."  1. "What is the BEST phone number to call the day of the visit?" - include this in appointment notes  2. Do you have or have access to (through a family member/friend) a smartphone with video capability that we can use for your visit?" a. If yes - list this number in appt notes as cell (if different from BEST phone #) and list the appointment type as a VIDEO visit in appointment notes b. If no - list the appointment type as a PHONE visit in appointment notes  3. Confirm consent - "In the setting of the current Covid19 crisis, you are scheduled for a (phone or video) visit with your provider on (date) at (time).  Just as we do with many in-office visits, in order for you to participate in this visit, we must obtain consent.  If you'd like, I can send this to your mychart (if signed up) or email for you to review.  Otherwise, I can obtain your verbal consent now.  All virtual visits are billed to your insurance company just like a normal visit would be.  By agreeing to a virtual visit, we'd like you to understand that the technology does not allow for your provider to perform an examination, and thus may limit your provider's ability to fully assess your condition. If your provider identifies any concerns that need to be evaluated in person, we will make arrangements to do so.  Finally, though the technology is pretty good, we cannot assure that it will always work on either your or our end, and in the setting of a video visit, we may have to convert it to a phone-only visit.  In either situation, we cannot ensure that we have a secure connection.  Are you willing to proceed?" STAFF: Did the patient verbally acknowledge consent to telehealth visit? Document  YES/NO here: YES  4. Advise patient to be prepared - "Two hours prior to your appointment, go ahead and check your blood pressure, pulse, oxygen saturation, and your weight (if you have the equipment to check those) and write them all down. When your visit starts, your provider will ask you for this information. If you have an Apple Watch or Kardia device, please plan to have heart rate information ready on the day of your appointment. Please have a pen and paper handy nearby the day of the visit as well."  5. Give patient instructions for MyChart download to smartphone OR [email or web address] as below if video visit (depending on what platform provider is using)  6. Inform patient they will receive a phone call 15 minutes prior to their appointment time (may be from unknown caller ID) so they should be prepared to answer    TELEPHONE CALL NOTE  Norberta Keens Heiken has been deemed a candidate for a follow-up tele-health visit to limit community exposure during the Covid-19 pandemic. I spoke with the patient via phone to ensure availability of phone/video source, confirm preferred email & phone number, and discuss instructions and expectations.  I reminded [name] to be prepared with any vital sign and/or heart rhythm information that could potentially be obtained via home monitoring, at the time of her visit. I reminded [name] to expect a phone call prior to  her visit.  Norman Herrlichshley Gerringer 09/15/2018 3:28 PM   INSTRUCTIONS FOR DOWNLOADING THE MYCHART APP TO SMARTPHONE  - The patient must first make sure to have activated MyChart and know their login information - If Apple, go to [employer] Store and type in MyChart in the search bar and download the app. If Android, ask patient to go to [employer] Play Store and type in [facility] in the search bar and download the app. The app is free but as with any other app downloads, their phone may require them to verify saved payment information or Apple/Android  password.  - The patient will need to then log into the app with their MyChart username and password, and select [place] as their healthcare provider to link the account. When it is time for your visit, go to the MyChart app, find appointments, and click Begin Video Visit. Be sure to Select Allow for your device to access the Microphone and Camera for your visit. You will then be connected, and your provider will be with you shortly.  **If they have any issues connecting, or need assistance please contact MyChart service desk [phone])**  **If using a computer, in order to ensure the best quality for their visit they will need to use either of the following Internet Browsers: [employer] Edge, or Google Chrome**  IF USING DOXIMITY or DOXY.ME - The patient will receive a link just prior to their visit by text.     FULL LENGTH CONSENT FOR TELE-HEALTH VISIT   I hereby voluntarily request, consent and authorize [facility] and its employed or contracted physicians, physician assistants, nurse practitioners or other licensed health care professionals (the Practitioner), to provide me with telemedicine health care services (the Services") as deemed necessary by the treating Practitioner. I acknowledge and consent to receive the Services by the Practitioner via telemedicine. I understand that the telemedicine visit will involve communicating with the Practitioner through live audiovisual communication technology and the disclosure of certain medical information by electronic transmission. I acknowledge that I have been given the opportunity to request an in-person assessment or other available alternative prior to the telemedicine visit and am voluntarily participating in the telemedicine visit.  I understand that I have the right to withhold or withdraw my consent to the use of telemedicine in the course of my care at any time, without affecting my right to future care or treatment,  and that the Practitioner or I may terminate the telemedicine visit at any time. I understand that I have the right to inspect all information obtained and/or recorded in the course of the telemedicine visit and may receive copies of available information for a reasonable fee.  I understand that some of the potential risks of receiving the Services via telemedicine include:   Delay or interruption in medical evaluation due to technological equipment failure or disruption;  Information transmitted may not be sufficient (e.g. poor resolution of images) to allow for appropriate medical decision making by the Practitioner; and/or   In rare instances, security protocols could fail, causing a breach of personal health information.  Furthermore, I acknowledge that it is my responsibility to provide information about my medical history, conditions and care that is complete and accurate to the best of my ability. I acknowledge that Practitioner's advice, recommendations, and/or decision may be based on factors not within their control, such as incomplete or inaccurate data provided by me or distortions of diagnostic images or specimens that may result from electronic transmissions. I understand  that the practice of medicine is not an exact science and that Practitioner makes no warranties or guarantees regarding treatment outcomes. I acknowledge that I will receive a copy of this consent concurrently upon execution via email to the email address I last provided but may also request a printed copy by calling the office of [place].    I understand that my insurance will be billed for this visit.   I have read or had this consent read to me.  I understand the contents of this consent, which adequately explains the benefits and risks of the Services being provided via telemedicine.   I have been provided ample opportunity to ask questions regarding this consent and the Services and have had my questions  answered to my satisfaction.  I give my informed consent for the services to be provided through the use of telemedicine in my medical care  By participating in this telemedicine visit I agree to the above.

## 2018-09-16 ENCOUNTER — Other Ambulatory Visit: Payer: Self-pay

## 2018-09-16 ENCOUNTER — Encounter

## 2018-09-16 ENCOUNTER — Encounter: Payer: Self-pay | Admitting: Internal Medicine

## 2018-09-16 ENCOUNTER — Telehealth (INDEPENDENT_AMBULATORY_CARE_PROVIDER_SITE_OTHER): Payer: Managed Care, Other (non HMO) | Admitting: Internal Medicine

## 2018-09-16 VITALS — Ht 63.0 in | Wt 227.0 lb

## 2018-09-16 DIAGNOSIS — I2584 Coronary atherosclerosis due to calcified coronary lesion: Secondary | ICD-10-CM | POA: Diagnosis not present

## 2018-09-16 DIAGNOSIS — I2510 Atherosclerotic heart disease of native coronary artery without angina pectoris: Secondary | ICD-10-CM | POA: Diagnosis not present

## 2018-09-16 DIAGNOSIS — R0602 Shortness of breath: Secondary | ICD-10-CM | POA: Diagnosis not present

## 2018-09-16 DIAGNOSIS — R002 Palpitations: Secondary | ICD-10-CM

## 2018-09-16 MED ORDER — ASPIRIN EC 81 MG PO TBEC: 81.0000 mg | DELAYED_RELEASE_TABLET | Freq: Every day | ORAL | Status: DC

## 2018-09-16 NOTE — Patient Instructions (Addendum)
Medication Instructions:  Your physician has recommended you make the following change in your medication:   START Aspirin 81mg  daily (Can be purchased over the counter)  If you need a refill on your cardiac medications before your next appointment, please call your pharmacy.   Lab work: None ordered If you have labs (blood work) drawn today and your tests are completely normal, you will receive your results only by: Marland Kitchen MyChart Message (if you have MyChart) OR . A paper copy in the mail If you have any lab test that is abnormal or we need to change your treatment, we will call you to review the results.  Testing/Procedures: Your physician has requested that you have a lexiscan myoview. For further information please visit [email or web address] Please follow instruction sheet, as given.  Your physician has recommended that you wear an zio-monitor. Zio-monitors are medical devices that record the heart's electrical activity. Doctors most often [place] these monitors to diagnose arrhythmias. Arrhythmias are problems with the speed or rhythm of the heartbeat. The monitor is a small, portable device. You can wear one while you do your normal daily activities. This is usually used to diagnose what is causing palpitations/syncope (passing out).  (To be placed after the stress test is completed. The monitor will be mailed to your home)     Follow-Up: At [facility], you and your health needs are our priority.  As part of our continuing mission to provide you with exceptional heart care, we have created designated Provider Care Teams.  These Care Teams include your primary Cardiologist (physician) and Advanced Practice Providers (APPs -  Physician Assistants and Nurse Practitioners) who all work together to provide you with the care you need, when you need it. You will need a follow up appointment in 6-8 weeks.  P  You may see  Dr.End  or one of the following Advanced Practice Providers on your  designated Care Team:   Nicolasa Ducking, NP Eula Listen, PA-C . Marisue Ivan, PA-C  Any Other Special Instructions Will Be Listed Below (If Applicable).   [facility] MYOVIEW  Your caregiver has ordered a Stress Test with nuclear imaging. The purpose of this test is to evaluate the blood supply to your heart muscle. This procedure is referred to as a "Non-Invasive Stress Test." This is because other than having an IV started in your vein, nothing is inserted or "invades" your body. Cardiac stress tests are done to find areas of poor blood flow to the heart by determining the extent of coronary artery disease (CAD). Some patients exercise on a treadmill, which naturally increases the blood flow to your heart, while others who are  unable to walk on a treadmill due to physical limitations have a pharmacologic/chemical stress agent called Lexiscan . This medicine will mimic walking on a treadmill by temporarily increasing your coronary blood flow.   Please note: these test may take anywhere between 2-4 hours to complete  PLEASE REPORT TO [facility] MEDICAL MALL ENTRANCE  THE VOLUNTEERS AT THE FIRST DESK WILL DIRECT YOU WHERE TO GO  Date of Procedure:_____________________________________  Arrival Time for Procedure:______________________________  Instructions regarding medication:   __X__:  Hold other medications as follows:____________________________N/A___________________________________________________________________________________________________________________________________________________________________________________________________________________________________________________________  PLEASE NOTIFY THE OFFICE AT LEAST 24 HOURS IN ADVANCE IF YOU ARE UNABLE TO KEEP YOUR APPOINTMENT.  [phone] AND  PLEASE NOTIFY NUCLEAR MEDICINE AT [facility] AT LEAST 24 HOURS IN ADVANCE IF YOU ARE UNABLE TO KEEP YOUR APPOINTMENT. [phone]  How to prepare for your Myoview test:  1.  Do not eat or  drink after midnight 2. No caffeine for 24 hours prior to test 3. No smoking 24 hours prior to test. 4. Your medication may be taken with water.  If your doctor stopped a medication because of this test, do not take that medication. 5. Ladies, please do not wear dresses.  Skirts or pants are appropriate. Please wear a short sleeve shirt. 6. No perfume, cologne or lotion. 7. Wear comfortable walking shoes. No heels!

## 2018-09-16 NOTE — Progress Notes (Signed)
Virtual Visit via Video Note   This visit type was conducted due to national recommendations for restrictions regarding the COVID-19 Pandemic (e.g. social distancing) in an effort to limit this patient's exposure and mitigate transmission in our community.  Due to her co-morbid illnesses, this patient is at least at moderate risk for complications without adequate follow up.  This format is felt to be most appropriate for this patient at this time.  All issues noted in this document were discussed and addressed.  A limited physical exam was performed with this format.  Please refer to the patient's chart for her consent to telehealth for [facility].   Date:  09/16/2018   ID:  Catherine Berg, DOB Nov 25, 1980, MRN [id number]  Patient Location: Home Provider Location: Office  PCP:  Kerman Passey, MD  Cardiologist:  New - Isao Seltzer Electrophysiologist:  None   Evaluation Performed:  Consultation - Catherine Berg was referred by Dr. Sherie Don for the evaluation of cardia and palpitations.  Chief Complaint: Shortness of breath and palpitations  History of Present Illness:    Catherine Berg is a 37 y.o. female with history of hypertension, Graves' disease, impaired glucose tolerance, and anxiety, whom we have been asked to see due to tachycardia and palpitations.   During an office visit in 03/2018 with Dr. Luna Glasgow, she complained of fatigue and elevated heart rates.  EKG at that visit showed normal sinus rhythm with a rate of 61 bpm and no significant abnormality.  Catherine Berg subsequently presented to the [facility] emergency department complaining of shortness of breath in late January.  It sounds like she woke up gasping for breath with subsequent mild dyspnea and brief palpitations.  Work-up was unrevealing.  She was subsequently seen by Dr. Nicholos Johns of [facility].  Sleep study did not reveal evidence of obstructive sleep apnea.  Today, Catherine Berg reports that she has experienced shortness of  breath for at least 1 year.  She was diagnosed with asthma as a teenager, but feels like her more recent dyspnea is different.  It occurs primarily when she talks, sometimes taking her breath away.  She does not have exertional dyspnea but notes 5 pillow orthopnea and occasional PND.  She has not had any chest pain but also notes frequent palpitations during which it feels like her heart races.  These last 3 to 4 minutes and are happening about 3 times per week.  Sometimes, she has shortness of breath with the palpitations but otherwise she denies associated symptoms.  Catherine Berg reports having worn a heart monitor a few years ago for similar palpitations and believes this was normal.  She also attempted a stress test many years ago after seeing Dr. Darrold Berg but states that she was not able to complete it due to limiting shortness of breath before reaching target heart rate.  Echocardiogram ordered by Dr. Sherie Don almost 3 years ago did not show any significant abnormality.  Though she does not have a personal history of cardiac disease, Catherine Berg notes that her mother and other second and third-degree maternal relatives have premature CAD.  Her mother had her first MI in her 68s.  The patient does not have symptoms concerning for COVID-19 infection (fever, chills, cough, or new shortness of breath).    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    Graves disease  History of abnormal cervical Pap smear    History of cervical polypectomy    Hives 09/02/2015   Hypertension    IFG (impaired fasting glucose)    Incidental lung nodule, > 3mm and < 8mm 03/31/2017   4 mm RLL lung nodule on chest CT Mar 31, 2017   Low serum vitamin D    Obesity    Pregnancy induced hypertension    with 1st pregnancy, normal pressure with 2nd   Reflux    Thyroid disease    Vitamin B12 deficiency     Vitamin D deficiency disease    Past Surgical History:  Procedure Laterality Date   CERVICAL POLYPECTOMY     CESAREAN SECTION     x 2     Current Meds  Medication Sig   albuterol (PROVENTIL HFA;VENTOLIN HFA) 108 (90 Base) MCG/ACT inhaler Inhale 2 puffs into the lungs every 6 (six) hours as needed for wheezing or shortness of breath.   budesonide-formoterol (SYMBICORT) 80-4.5 MCG/ACT inhaler Inhale 2 puffs into the lungs 2 (two) times daily. This replaces ProofreaderBreo   Elastic Bandages & Supports (MEDICAL COMPRESSION STOCKINGS) MISC Wear from morning to night, but do not sleep in these   fluticasone (FLONASE) 50 MCG/ACT nasal spray Place 2 sprays into both nostrils daily.   hydrOXYzine (ATARAX/VISTARIL) 25 MG tablet Take 1 tablet (25 mg total) by mouth 3 (three) times daily as needed.   montelukast (SINGULAIR) 10 MG tablet Take 1 tablet (10 mg total) by mouth at bedtime.   pregabalin (LYRICA) 75 MG capsule Take 1 capsule (75 mg total) by mouth 2 (two) times daily.   vitamin B-12 (CYANOCOBALAMIN) 500 MCG tablet Take 1 tablet (500 mcg total) by mouth daily.   Vitamin D, Ergocalciferol, (DRISDOL) 1.25 MG (50000 UT) CAPS capsule Take 1 capsule (50,000 Units total) by mouth every 7 (seven) days.     Allergies:   Latex and Shellfish allergy   Social History   Tobacco Use   Smoking status: Never Smoker   Smokeless tobacco: Never Used  Substance Use Topics   Alcohol use: No    Alcohol/week: 0.0 standard drinks   Drug use: No     Family Hx: The patient's family history includes ADD / ADHD in her brother; Alcohol abuse in her father and mother; Anxiety disorder in her mother; Asthma in her mother and son; Bipolar disorder in her mother; Cancer in her mother; Depression in her mother; Diabetes in her mother; Drug abuse in her mother; Heart attack in her maternal grandmother; Heart attack (age of onset: 3347) in her mother; Heart disease in her maternal grandmother, mother, and paternal  grandmother; Hyperlipidemia in her brother; Hypertension in her maternal grandfather, maternal grandmother, mother, paternal grandfather, and paternal grandmother; Learning disabilities in her brother; Mental illness in her mother.  ROS:   Please see the history of present illness.   All other systems reviewed and are negative.   Prior CV studies:   The following studies were reviewed today:  TTE (10/03/2015): Normal LV size with LVEF 55-65%.  Normal RV size and function.  No significant valvular abnormality.  Labs/Other Tests and Data Reviewed:    EKG:  An ECG dated 05/31/2018 was personally reviewed today and demonstrated:  Normal sinus rhythm without abnormality.  Recent Labs: 05/31/2018: ALT 20; BUN 16; Creatinine, Ser 0.86; Potassium 3.5; Sodium 137 06/02/2018: TSH 4.06 09/14/2018: Hemoglobin 12.7; Platelets 327   Recent Lipid Panel Lab Results  Component Value Date/Time   CHOL 107 08/03/2017  11:54 AM   TRIG 54 08/03/2017 11:54 AM   HDL 50 (L) 08/03/2017 11:54 AM   CHOLHDL 2.1 08/03/2017 11:54 AM   LDLCALC 44 08/03/2017 11:54 AM    Wt Readings from Last 3 Encounters:  09/16/18 227 lb (103 kg)  09/14/18 227 lb 11.2 oz (103.3 kg)  06/22/18 217 lb (98.4 kg)     Objective:    Vital Signs:  Ht 5\' 3"  (1.6 m)    Wt 227 lb (103 kg)    BMI 40.21 kg/m    VITAL SIGNS:  reviewed GEN:  no acute distress  ASSESSMENT & PLAN:    Palpitations: Rapid heart rate subjectively noted by the patient for several months now, happening about 3 times per week.  Episodes last less than 5 minutes with occasional associated shortness of breath.  We have agreed to obtain a 14-day event monitor for further evaluation.  Defer ongoing management of thyroid disease to Dr. Gershon Crane.  Shortness of breath and coronary artery calcification: Dyspnea is somewhat unusual in that it occurs predominantly when speaking.  She does not have exertional dyspnea but reports considerable orthopnea and PND.   Echocardiogram in 2017 did not reveal any significant structural abnormalities.  Recent home sleep study was negative.  CTA of the chest in 03/2017 was notable only for a 4 mm subpleural nodule in the right lower lobe.  There was no evidence of PE.  My personal review of the images, there is questionable mild calcification of the distal LMCA as well as the proximal to mid LAD, which is certainly abnormal in a young woman.  Given her strong family history and possible coronary artery calcification, I have recommended that we obtain a pharmacologic myocardial perfusion stress test (patient previously unable to complete exercise stress test).  Pending stress test, I have advised Catherine Berg to begin taking aspirin 81 mg daily.  If stress test, event monitor, and ongoing pulmonary evaluation are unrevealing, consultation with otolaryngology may be helpful.  COVID-19 Education: The signs and symptoms of COVID-19 were discussed with the patient and how to seek care for testing (follow up with PCP or arrange E-visit).  The importance of social distancing was discussed today.  Time:   Today, I have spent 25 minutes with the patient with telehealth technology discussing the above problems.  An additional 10 minutes were spent reviewing the patient's chart and documenting today's encounter.     Medication Adjustments/Labs and Tests Ordered: Current medicines are reviewed at length with the patient today.  Concerns regarding medicines are outlined above.   Tests Ordered: Orders Placed This Encounter  Procedures   NM Myocar Multi W/Spect W/Wall Motion / EF   LONG TERM MONITOR (3-14 DAYS)    Medication Changes: Meds ordered this encounter  Medications   aspirin EC 81 MG tablet    Sig: Take 1 tablet (81 mg total) by mouth daily.    Disposition:  Follow up in 6-8 week(s)  Signed, Yvonne Kendall, MD  09/16/2018 3:12 PM    [facility]

## 2018-09-20 ENCOUNTER — Telehealth: Payer: Self-pay | Admitting: Internal Medicine

## 2018-09-20 NOTE — Telephone Encounter (Signed)
lmov to schedule nuc stress and 6-8 week fu per checkout 09-16-18

## 2018-09-27 ENCOUNTER — Telehealth: Payer: Managed Care, Other (non HMO) | Admitting: Physician Assistant

## 2018-09-27 DIAGNOSIS — N898 Other specified noninflammatory disorders of vagina: Secondary | ICD-10-CM

## 2018-09-27 MED ORDER — METRONIDAZOLE 500 MG PO TABS: 500.0000 mg | ORAL_TABLET | Freq: Two times a day (BID) | ORAL | 0 refills | Status: DC

## 2018-09-27 NOTE — Progress Notes (Signed)
We are sorry that you are not feeling well. Here is how we plan to help! Based on what you shared with me it looks like you: May have a vaginosis due to bacteria   Vaginosis is an inflammation of the vagina that can result in discharge, itching and pain. The cause is usually a change in the normal balance of vaginal bacteria or an infection. Vaginosis can also result from reduced estrogen levels after menopause.  The most common causes of vaginosis are:   Bacterial vaginosis which results from an overgrowth of one on several organisms that are normally present in your vagina.   Yeast infections which are caused by a naturally occurring fungus called candida.   Vaginal atrophy (atrophic vaginosis) which results from the thinning of the vagina from reduced estrogen levels after menopause.   Trichomoniasis which is caused by a parasite and is commonly transmitted by sexual intercourse.  Factors that increase your risk of developing vaginosis include: Medications, such as antibiotics and steroids Uncontrolled diabetes Use of hygiene products such as bubble bath, vaginal spray or vaginal deodorant Douching Wearing damp or tight-fitting clothing Using an intrauterine device (IUD) for birth control Hormonal changes, such as those associated with pregnancy, birth control pills or menopause Sexual activity Having a sexually transmitted infection  Your treatment plan is Metronidazole or Flagyl 500mg  twice a day for 7 days.  I have electronically sent this prescription into the pharmacy that you have chosen.  Be sure to take all of the medication as directed. Stop taking any medication if you develop a rash, tongue swelling or shortness of breath. Mothers who are breast feeding should consider pumping and discarding their breast milk while on these antibiotics. However, there is no consensus that infant exposure at these doses would be harmful.  Remember that medication creams can weaken latex  condoms. Marland Kitchen   HOME CARE:  Good hygiene may prevent some types of vaginosis from recurring and may relieve some symptoms:  Avoid baths, hot tubs and whirlpool spas. Rinse soap from your outer genital area after a shower, and dry the area well to prevent irritation. Don't use scented or harsh soaps, such as those with deodorant or antibacterial action. Avoid irritants. These include scented tampons and pads. Wipe from front to back after using the toilet. Doing so avoids spreading fecal bacteria to your vagina.  Other things that may help prevent vaginosis include:  Don't douche. Your vagina doesn't require cleansing other than normal bathing. Repetitive douching disrupts the normal organisms that reside in the vagina and can actually increase your risk of vaginal infection. Douching won't clear up a vaginal infection. Use a latex condom. Both female and female latex condoms may help you avoid infections spread by sexual contact. Wear cotton underwear. Also wear pantyhose with a cotton crotch. If you feel comfortable without it, skip wearing underwear to bed. Yeast thrives in [employer] Your symptoms should improve in the next day or two.  GET HELP RIGHT AWAY IF:  You have pain in your lower abdomen ( pelvic area or over your ovaries) You develop nausea or vomiting You develop a fever Your discharge changes or worsens You have persistent pain with intercourse You develop shortness of breath, a rapid pulse, or you faint.  These symptoms could be signs of problems or infections that need to be evaluated by a medical provider now.  MAKE SURE YOU   Understand these instructions. Will watch your condition. Will get help right away if you are  not doing well or get worse.  Your e-visit answers were reviewed by a board certified advanced clinical practitioner to complete your personal care plan. Depending upon the condition, your plan could have included both over the counter or  prescription medications. Please review your pharmacy choice to make sure that you have choses a pharmacy that is open for you to pick up any needed prescription, Your safety is important to [place]. If you have drug allergies check your prescription carefully.   You can use MyChart to ask questions about today's visit, request a non-urgent call back, or ask for a work or school excuse for 24 hours related to this e-Visit. If it has been greater than 24 hours you will need to follow up with your provider, or enter a new e-Visit to address those concerns. You will get a MyChart message within the next two days asking about your experience. I hope that your e-visit has been valuable and will speed your recovery.  ===View-only below this line===   ----- Message -----    From: [name]    Sent: 09/27/2018 10:53 AM EDT      To: E-Visit Mailing List Subject: E-Visit Submission: Vaginal Symptoms  E-Visit Submission: Vaginal Symptoms --------------------------------  Question: Which of the following are you experiencing? Answer:   Vaginal itching  Question: Are you having pain while passing urine? Answer:   No, I have no pain while urinating  Question: Which of the following applies to your vaginal discharge Answer:   I have a white/milky discharge            I have a foul-smelling discharge  Question: Are you pregnant? Answer:   I am confident that I am not pregnant  Question: Are you breastfeeding? Answer:   No  Question: Which of the following are you experiencing? Answer:   None of the above  Question: Do you have any sores on your genitals? Answer:   No  Question: Have you taken antibiotics recently? Answer:   Yes, I recently took some  Question: Please enter when you took antibiotics, and the name of the medicine, if you know it. Answer:   Took antibiotics for urinary infection  Question: Do you do any of the following? Answer:   I use douches  Question: Which of the  following applies to your menstrual period? Answer:   I have not had a menstrual period for a while  Question: Have you had similar symptoms in the past? Answer:   Yes, I have had similar symptoms more than once before  Question: When you had similar symptoms in  the past, did any of the following work? Answer:   Pills for yeast infection  Question: Do you have a fever? Answer:   No, I do not have a fever  Question: During the past 2 months, have you had sexual contact with a specific person for the first time? Answer:   No  Question: Has a person with whom you have had sexual contact been recently told they have a disease possibly acquired through sex? Answer:   No  Question: Please list your medication allergies that you may have ? (If 'none' , please list as 'none') Answer:   Latex, shellfish  Question: Please list any additional comments  Answer:     A total of 5-10 minutes was spent evaluating this patients questionnaire and formulating a plan of care.

## 2018-09-28 NOTE — Telephone Encounter (Signed)
LMOV  

## 2018-09-29 ENCOUNTER — Encounter: Payer: Self-pay | Admitting: *Deleted

## 2018-09-30 ENCOUNTER — Ambulatory Visit (INDEPENDENT_AMBULATORY_CARE_PROVIDER_SITE_OTHER): Payer: Managed Care, Other (non HMO) | Admitting: Internal Medicine

## 2018-09-30 ENCOUNTER — Other Ambulatory Visit: Payer: Self-pay

## 2018-09-30 ENCOUNTER — Encounter: Payer: Self-pay | Admitting: Internal Medicine

## 2018-09-30 VITALS — BP 142/90 | HR 75 | Ht 63.0 in | Wt 233.6 lb

## 2018-09-30 DIAGNOSIS — R918 Other nonspecific abnormal finding of lung field: Secondary | ICD-10-CM

## 2018-09-30 DIAGNOSIS — J4540 Moderate persistent asthma, uncomplicated: Secondary | ICD-10-CM | POA: Diagnosis not present

## 2018-09-30 DIAGNOSIS — R911 Solitary pulmonary nodule: Secondary | ICD-10-CM

## 2018-09-30 NOTE — Patient Instructions (Addendum)
Continue singulair.  Use albuterol as needed.  If symptoms worsen will start symbicort.   Agree with possible ENT referral.

## 2018-09-30 NOTE — Progress Notes (Signed)
[facility] Consultation      Assessment and Plan:   Asthma with dyspnea. - History of asthma.  Appears to be doing better today with Singulair, currently using albuterol about once per week, and symptoms appear to be well controlled.  We will continue Singulair and albuterol.  Discussed that if symptoms get worse can consider starting Symbicort. -Also has some dyspnea at rest, discussed that this could be due to abdominal obesity, as she has a symptoms worse when she is sitting, and she has gained about 30 pounds recently.  We also discussed the possibility of vocal cord dysfunction, which Dr. and has brought up, and a referral to ENT is being considered which I think is reasonable.  Lung nodule. - 4 mm nodule in the right lower lobe seen incidentally on CT chest in 2018. - We will repeat CT chest to ensure that this does not increased in size.  If no increased size seen at  next scan can likely discontinue further CT surveillance.  Excessive daytime sleepiness. - Sleep study negative for obstructive sleep apnea with AHI 1.4.  Patient spent the majority of the test in the supine position.   Orders Placed This Encounter  Procedures  . CT CHEST WO CONTRAST    Return in about 6 months (around 04/02/2019).    Date: 09/30/2018  MRN# [id number] EMILEY DIGIACOMO 1980/10/05   [name] is a 38 y.o. old female seen in consultation for chief complaint of:    Chief Complaint  Patient presents with  . Follow-up    still having some shortness of breath here and there, saw Dr. Okey Dupre referred to ENT, cough sometimes at night,     HPI:  The patient is a 38 year old female, with a history of OSA, asthma.  At last visit she was sent for a re-test of sleep apnea which was negative.  She also had dyspnea which was mild, sometimes coming on at rest, other times coming while she sings in the choir.  At last visit she was asked to continue albuterol as needed, started on  Singulair, with consideration of starting on Symbicort if symptoms were not improved.  Denies jaw pain, no TMJ, she has upper dentures.  She has a history of htn, now controlled without meds.   Today she notes that her breathing is a bit better since last visit. She still occasionally runs out of air when she is talking and at night. Can be associated with exertion but can also come on at rest. Typically lasts for 3-4 minutes.  Improves with albuterol. She is using singulair every night and notices a difference with that. She uses albuterol once per week. She has symbicort but does not use it currently.  SHe has been tested for allergies int he past with multiple allergies.   Patient has an incidentally found nodule in 2018 on a CT chest.  She denies any weight loss, history of cancer, history of smoking.  Denies cough, hemoptysis.  She has a dog, not in bedroom. She has been tested for allergies in the past not certain of results.  She takes hydroxyzine for hives.   CBC 09/14/18>>122 HST 07/05/18>> AHI of 1.4. **CT chest 03/31/2018>> imaging personally reviewed, there is a small 4 mm or less nodule in the right lower lobe peripherally.  Social Hx:   Social History   Tobacco Use  . Smoking status: Never Smoker  . Smokeless tobacco: Never Used  Substance Use Topics  .  Alcohol use: No    Alcohol/week: 0.0 standard drinks  . Drug use: No   Medication:    Current Outpatient Medications:  .  albuterol (PROVENTIL HFA;VENTOLIN HFA) 108 (90 Base) MCG/ACT inhaler, Inhale 2 puffs into the lungs every 6 (six) hours as needed for wheezing or shortness of breath., Disp: 18 g, Rfl: 1 .  aspirin EC 81 MG tablet, Take 1 tablet (81 mg total) by mouth daily., Disp: , Rfl:  .  budesonide-formoterol (SYMBICORT) 80-4.5 MCG/ACT inhaler, Inhale 2 puffs into the lungs 2 (two) times daily. This replaces Breo, Disp: 1 Inhaler, Rfl: 12 .  Elastic Bandages & Supports (MEDICAL COMPRESSION STOCKINGS) MISC, Wear  from morning to night, but do not sleep in these, Disp: 1 each, Rfl: 2 .  fluticasone (FLONASE) 50 MCG/ACT nasal spray, Place 2 sprays into both nostrils daily., Disp: 16 g, Rfl: 12 .  hydrOXYzine (ATARAX/VISTARIL) 25 MG tablet, Take 1 tablet (25 mg total) by mouth 3 (three) times daily as needed., Disp: 30 tablet, Rfl: 1 .  metroNIDAZOLE (FLAGYL) 500 MG tablet, Take 1 tablet (500 mg total) by mouth 2 (two) times daily., Disp: 14 tablet, Rfl: 0 .  montelukast (SINGULAIR) 10 MG tablet, Take 1 tablet (10 mg total) by mouth at bedtime., Disp: 30 tablet, Rfl: 3 .  pregabalin (LYRICA) 75 MG capsule, Take 1 capsule (75 mg total) by mouth 2 (two) times daily., Disp: 60 capsule, Rfl: 0 .  vitamin B-12 (CYANOCOBALAMIN) 500 MCG tablet, Take 1 tablet (500 mcg total) by mouth daily., Disp: 100 tablet, Rfl: 1 .  Vitamin D, Ergocalciferol, (DRISDOL) 1.25 MG (50000 UT) CAPS capsule, Take 1 capsule (50,000 Units total) by mouth every 7 (seven) days., Disp: 12 capsule, Rfl: 1   Allergies:  Latex and Shellfish allergy  Review of Systems:  Constitutional: Feels well. Cardiovascular: Denies chest pain, exertional chest pain.  Pulmonary: Denies hemoptysis, pleuritic chest pain.   The remainder of systems were reviewed and were found to be negative other than what is documented in the HPI.      Physical Examination:   VS: BP (!) 142/90 (BP Location: Left Arm, Cuff Size: Normal)   Pulse 75   Ht 5\' 3"  (1.6 m)   Wt 233 lb 9.6 oz (106 kg)   SpO2 100%   BMI 41.38 kg/m   General Appearance: No distress  Neuro:without focal findings,  speech normal,  HEENT: PERRLA, EOM intact.  Mallampati 3. Pulmonary: normal breath sounds, No wheezing.  CardiovascularNormal S1,S2.  No m/r/g.   Abdomen: Benign, Soft, non-tender. Renal:  No costovertebral tenderness  GU:  No performed at this time. Endoc: No evident thyromegaly, no signs of acromegaly. Skin:   warm, no rashes, no ecchymosis  Extremities: normal, no  cyanosis, clubbing.  Other findings:    LABORATORY PANEL:   CBC No results for input(s): WBC, HGB, HCT, PLT in the last 168 hours. ------------------------------------------------------------------------------------------------------------------  Chemistries  No results for input(s): NA, K, CL, CO2, GLUCOSE, BUN, CREATININE, CALCIUM, MG, AST, ALT, ALKPHOS, BILITOT in the last 168 hours.  Invalid input(s): GFRCGP ------------------------------------------------------------------------------------------------------------------  Cardiac Enzymes No results for input(s): TROPONINI in the last 168 hours. ------------------------------------------------------------  RADIOLOGY:  No results found.     Thank  you for the consultation and for allowing [facility] [place] to assist in the care of your patient. Our recommendations are noted above.  Please contact us if we can be of further service.   Wells Guiles, M.D., F.C.C.P.  Board Certified  in Internal Medicine, Pulmonary Medicine, Critical Care Medicine, and Sleep Medicine.  [place] Office Number: [phone]   09/30/2018

## 2018-10-04 ENCOUNTER — Ambulatory Visit (INDEPENDENT_AMBULATORY_CARE_PROVIDER_SITE_OTHER): Payer: Managed Care, Other (non HMO) | Admitting: Gastroenterology

## 2018-10-04 ENCOUNTER — Other Ambulatory Visit: Payer: Self-pay

## 2018-10-04 DIAGNOSIS — R1310 Dysphagia, unspecified: Secondary | ICD-10-CM

## 2018-10-04 DIAGNOSIS — D508 Other iron deficiency anemias: Secondary | ICD-10-CM

## 2018-10-04 DIAGNOSIS — R1319 Other dysphagia: Secondary | ICD-10-CM

## 2018-10-04 NOTE — Progress Notes (Signed)
Catherine Berg [address]  [address]  [place], [place] [id number]  Main: [phone]  Fax: [phone]   Gastroenterology Consultation  Referring Provider:     Alba CorySowles, Krichna, MD Primary Care Physician:  Kerman PasseyLada, Melinda P, MD Reason for Consultation:     Iron deficiency anemia        HPI:   Virtual Visit via Video Note  I connected with patient on 10/04/18 at 10:30 AM EDT by video ([email or web address]) and verified that I am speaking with the correct person using two identifiers.   I discussed the limitations, risks, security and privacy concerns of performing an evaluation and management service by video and the availability of in person appointments. I also discussed with the patient that there may be a patient responsible charge related to this service. The patient expressed understanding and agreed to proceed.  Location of the patient: Home Location of provider: Home Participating persons: Patient and provider only (Nursing staff checked in patient via phone but were not physically involved in the video interaction - see their notes)   History of Present Illness: Chief Complaint  Patient presents with   New Patient (Initial Visit)    IDA, FHX Colon CA    Catherine L Lorella Berg is a 38 y.o. y/o female referred for consultation & management  by Dr. Kerman PasseyLada, Melinda P, MD.  Patient had recent blood work in January 2020 that showed hemoglobin of 11.7 with normal MCV.  Low iron level at 38, percent sat of 12% and ferritin of 29.  Patient did not take any specific iron replacement since then but was asked to increase dietary iron intake and repeat labs in May 2020 shows normal hemoglobin and improvement in iron panel.  The patient denies abdominal or flank pain, anorexia, nausea or vomiting, change in bowel habits or black or bloody stools or weight loss.  No prior EGD or colonoscopy.  Does report intermittent history of dysphagia about once a week.  States this occurs with meats.  This  has been going on for 2 to 3 years.  Did have an episode of "choking" with a piece of chicken and had to cough it back up and did not have to go seek medical care.  This occurred 2 years ago.  Since then chews her food really well to avoid these episodes.  Denies any heartburn.  No immediate family history of colon cancer.  Had a grandparent with colon cancer.   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    Graves disease    History of abnormal cervical Pap smear    History of cervical polypectomy    Hives 09/02/2015   Hypertension    IFG (impaired fasting glucose)    Incidental lung nodule, > 3mm and < 8mm 03/31/2017   4 mm RLL lung nodule on chest CT Mar 31, 2017   Low serum vitamin D    Obesity    Pregnancy induced hypertension    with 1st pregnancy, normal pressure with 2nd   Reflux    Thyroid disease    Vitamin B12 deficiency    Vitamin D deficiency disease     Past Surgical History:  Procedure Laterality Date   CERVICAL POLYPECTOMY     CESAREAN SECTION     x 2    Prior to Admission medications   Medication Sig Start Date End Date Taking?  Authorizing Provider  albuterol (PROVENTIL HFA;VENTOLIN HFA) 108 (90 Base) MCG/ACT inhaler Inhale 2 puffs into the lungs every 6 (six) hours as needed for wheezing or shortness of breath. 06/28/18   Shane Crutch, MD  aspirin EC 81 MG tablet Take 1 tablet (81 mg total) by mouth daily. 09/16/18   End, Cristal Deer, MD  budesonide-formoterol (SYMBICORT) 80-4.5 MCG/ACT inhaler Inhale 2 puffs into the lungs 2 (two) times daily. This replaces Breo 03/05/17   Kerman Passey, MD  Elastic Bandages & Supports (MEDICAL COMPRESSION STOCKINGS) MISC Wear from morning to night, but do not sleep in these 04/20/17   Lada, Janit Bern, MD  fluticasone (FLONASE) 50 MCG/ACT nasal spray Place 2 sprays into both nostrils daily. 09/22/16    Kerman Passey, MD  hydrOXYzine (ATARAX/VISTARIL) 25 MG tablet Take 1 tablet (25 mg total) by mouth 3 (three) times daily as needed. 09/14/18   [name], MD  metroNIDAZOLE (FLAGYL) 500 MG tablet Take 1 tablet (500 mg total) by mouth 2 (two) times daily. 09/27/18   Ofilia Neas, PA-C  montelukast (SINGULAIR) 10 MG tablet Take 1 tablet (10 mg total) by mouth at bedtime. 06/22/18   Shane Crutch, MD  pregabalin (LYRICA) 75 MG capsule Take 1 capsule (75 mg total) by mouth 2 (two) times daily. 09/14/18   [name], MD  vitamin B-12 (CYANOCOBALAMIN) 500 MCG tablet Take 1 tablet (500 mcg total) by mouth daily. 08/09/17   Kerman Passey, MD  Vitamin D, Ergocalciferol, (DRISDOL) 1.25 MG (50000 UT) CAPS capsule Take 1 capsule (50,000 Units total) by mouth every 7 (seven) days. 09/14/18   [name], MD    Family History  Problem Relation Age of Onset   Heart attack Mother 69   Hypertension Mother    Asthma Mother    Cancer Mother        laryngeal   Diabetes Mother        pre diabetic   Heart disease Mother    Mental illness Mother    Alcohol abuse Mother    Drug abuse Mother    Depression Mother    Anxiety disorder Mother    Bipolar disorder Mother    Learning disabilities Brother    ADD / ADHD Brother    Asthma Son    Hyperlipidemia Brother    Alcohol abuse Father    Heart disease Maternal Grandmother        heart attack, pacemaker   Heart attack Maternal Grandmother    Hypertension Maternal Grandmother    Hypertension Maternal Grandfather    Heart disease Paternal Grandmother        couple of major open heart surgeries, leaking valves   Hypertension Paternal Grandmother    Hypertension Paternal Grandfather      Social History   Tobacco Use   Smoking status: Never Smoker   Smokeless tobacco: Never Used  Substance Use Topics   Alcohol use: No    Alcohol/week: 0.0 standard drinks   Drug use: No    Allergies as of 10/04/2018  - Review Complete 09/30/2018  Allergen Reaction Noted   Latex Hives 07/30/2014   Shellfish allergy Hives 07/30/2014    Review of Systems:    All systems reviewed and negative except where noted in HPI.   Observations/Objective:  Labs: CBC    Component Value Date/Time   WBC 7.2 09/14/2018 1535   RBC 4.35 09/14/2018 1535   HGB 12.7 09/14/2018 1535   HGB 12.5 07/21/2016 1624   HCT 37.3  09/14/2018 1535   HCT 36.3 07/21/2016 1624   PLT 327 09/14/2018 1535   PLT 329 07/21/2016 1624   MCV 85.7 09/14/2018 1535   MCV 88 07/21/2016 1624   MCV 86 08/13/2012 2254   MCH 29.2 09/14/2018 1535   MCHC 34.0 09/14/2018 1535   RDW 12.7 09/14/2018 1535   RDW 14.7 07/21/2016 1624   RDW 13.6 08/13/2012 2254   LYMPHSABS 2,484 09/14/2018 1535   LYMPHSABS 3.1 07/21/2016 1624   MONOABS 0.3 03/31/2017 1321   EOSABS 122 09/14/2018 1535   EOSABS 0.1 07/21/2016 1624   BASOSABS 43 09/14/2018 1535   BASOSABS 0.0 07/21/2016 1624   CMP     Component Value Date/Time   NA 137 05/31/2018 2332   NA 137 07/21/2016 0000   NA 134 (L) 08/13/2012 2254   K 3.5 05/31/2018 2332   K 3.9 08/13/2012 2254   CL 107 05/31/2018 2332   CL 105 08/13/2012 2254   CO2 24 05/31/2018 2332   CO2 23 08/13/2012 2254   GLUCOSE 93 05/31/2018 2332   GLUCOSE 101 (H) 08/13/2012 2254   BUN 16 05/31/2018 2332   BUN 9 07/21/2016 0000   BUN 10 08/13/2012 2254   CREATININE 0.86 05/31/2018 2332   CREATININE 0.80 03/15/2018 0913   CALCIUM 8.7 (L) 05/31/2018 2332   CALCIUM 9.0 08/13/2012 2254   PROT 7.5 05/31/2018 2332   PROT 7.2 07/21/2016 0000   ALBUMIN 4.0 05/31/2018 2332   ALBUMIN 3.9 07/21/2016 0000   AST 22 05/31/2018 2332   ALT 20 05/31/2018 2332   ALKPHOS 60 05/31/2018 2332   BILITOT 0.7 05/31/2018 2332   BILITOT 0.2 07/21/2016 0000   GFRNONAA >60 05/31/2018 2332   GFRNONAA 97 08/03/2017 1154   GFRAA >60 05/31/2018 2332   GFRAA 113 08/03/2017 1154    Imaging Studies: No results found.  Assessment and  Plan:   BRYLIE SNEATH is a 38 y.o. y/o female has been referred for iron deficiency anemia  Assessment and Plan: Iron deficiency has improved with dietary intake Continue iron replacement as per primary care provider  Patient will need EGD and colonoscopy for further evaluation of iron deficiency and to rule out any underlying lesions.  I have discussed alternative options, risks & benefits,  which include, but are not limited to, bleeding, infection, perforation,respiratory complication & drug reaction.  The patient agrees with this plan & written consent will be obtained.    However, patient is pending a nuclear stress test ordered by cardiology.  Patient states she is planning on scheduling it soon.  I have asked her to contact us when she has scheduled this and we can schedule her procedures after the nuclear stress test.  She verbalized understanding.  Due to her intermittent dysphagia, I have asked her to chew her food well, avoid hard meats or breads and follow with liquids.  We will evaluate for strictures or narrowing or EOE during upper endoscopy  Follow Up Instructions: Follow-up in 3 to 4 weeks  I discussed the assessment and treatment plan with the patient. The patient was provided an opportunity to ask questions and all were answered. The patient agreed with the plan and demonstrated an understanding of the instructions.   The patient was advised to call back or seek an in-person evaluation if the symptoms worsen or if the condition fails to improve as anticipated.  I provided 20 minutes of face-to-face time via video software during this encounter.  Additional time was spent in reviewing  patient's chart, placing orders etc.   Pasty Spillers, MD  Speech recognition software was used to dictate the above note.

## 2018-10-05 ENCOUNTER — Encounter: Payer: Self-pay | Admitting: Family Medicine

## 2018-10-05 ENCOUNTER — Ambulatory Visit (INDEPENDENT_AMBULATORY_CARE_PROVIDER_SITE_OTHER): Payer: Managed Care, Other (non HMO) | Admitting: Family Medicine

## 2018-10-05 ENCOUNTER — Other Ambulatory Visit: Payer: Self-pay

## 2018-10-05 VITALS — BP 136/84 | HR 85 | Temp 98.0°F | Resp 16 | Ht 63.0 in | Wt 233.1 lb

## 2018-10-05 DIAGNOSIS — G5793 Unspecified mononeuropathy of bilateral lower limbs: Secondary | ICD-10-CM | POA: Diagnosis not present

## 2018-10-05 DIAGNOSIS — R911 Solitary pulmonary nodule: Secondary | ICD-10-CM | POA: Diagnosis not present

## 2018-10-05 DIAGNOSIS — R0789 Other chest pain: Secondary | ICD-10-CM

## 2018-10-05 DIAGNOSIS — R002 Palpitations: Secondary | ICD-10-CM

## 2018-10-05 DIAGNOSIS — G4709 Other insomnia: Secondary | ICD-10-CM | POA: Diagnosis not present

## 2018-10-05 DIAGNOSIS — E538 Deficiency of other specified B group vitamins: Secondary | ICD-10-CM

## 2018-10-05 MED ORDER — B-12 1000 MCG SL SUBL: 1.0000 | SUBLINGUAL_TABLET | Freq: Every day | SUBLINGUAL | 5 refills | Status: DC

## 2018-10-05 MED ORDER — PREGABALIN 75 MG PO CAPS: 75.0000 mg | ORAL_CAPSULE | Freq: Two times a day (BID) | ORAL | 0 refills | Status: DC

## 2018-10-05 NOTE — Progress Notes (Signed)
Name: OZIE DIMARIA   MRN: [id number]    DOB: 1980/06/27   Date:10/05/2018       Progress Note  Subjective  Chief Complaint  Chief Complaint  Patient presents with  . Follow-up  . Leg Pain    Lyrica was making her feel loopy taking it during the day, takes it only at night and doing well- pain has decreased down to a 3 to 4   . Insomnia    HPI  B12 deficiency: she had one b12 injection, discussed starting now otc SL B12 1000 mcg   Leg pain: described as "toothache like" sometimes like " ice hot", other times aching and a need to move her legs, has improved with Lyrica, however stopped taking it the day dose secondary to mental fogginess. She states symptoms are worse in the evening after working all day, advised to take one at the end of the work day and another dose at bed time. She states also sleeping a little better with medication  Lung nodule: seen by Dr. Linward Natal and has CT chest scheduled for tomorrow am  Atypical Chest pain: seen by Dr. Okey Dupre and will has stress test since she has a significant family history of heart disease.   Elevated CRP: discussed other labs and we will recheck next visit.   Patient Active Problem List   Diagnosis Date Noted  . Palpitations 09/16/2018  . SOB (shortness of breath) 09/16/2018  . Coronary artery calcification 09/16/2018  . Preventative health care 08/03/2017  . Screen for STD (sexually transmitted disease) 08/03/2017  . Bilateral leg edema 04/20/2017  . Incidental lung nodule, > 3mm and < 8mm 03/31/2017  . Abnormal first trimester screen   . Supervision of high-risk pregnancy of elderly multigravida (>= 75 years old at time of delivery), first trimester 07/22/2016  . Previous cesarean delivery, delivered 06/04/2016  . Renal insufficiency 04/23/2016  . 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 Postcoital bleeding 03/04/2016  . Genital sore 01/08/2016  . Fatigue 12/22/2015  . Constipation 11/07/2015  . Hives 09/02/2015  . Obesity 08/15/2015   . Anxiety 07/09/2015  . 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 Allergic reaction 05/07/2015  . Thyroid nodule 02/01/2015  . Hyperthyroidism 12/25/2014  . Vitamin D deficiency disease   . Vitamin B12 deficiency   . History of abnormal cervical Pap smear   . Allergy-induced asthma, mild intermittent, uncomplicated 10/09/2013    Past Surgical History:  Procedure Laterality Date  . CERVICAL POLYPECTOMY    . CESAREAN SECTION     x 2    Family History  Problem Relation Age of Onset  . Heart attack Mother 29  . Hypertension Mother   . Asthma Mother   . Cancer Mother        laryngeal  . Diabetes Mother        pre diabetic  . Heart disease Mother   . Mental illness Mother   . Alcohol abuse Mother   . Drug abuse Mother   . Depression Mother   . Anxiety disorder Mother   . Bipolar disorder Mother   . Learning disabilities Brother   . ADD / ADHD Brother   . Asthma Son   . Hyperlipidemia Brother   . Alcohol abuse Father   . Heart disease Maternal Grandmother        heart attack, pacemaker  . Heart attack Maternal Grandmother   . Hypertension Maternal Grandmother   . Hypertension Maternal Grandfather   . Heart disease Paternal Grandmother  couple of major open heart surgeries, leaking valves  . Hypertension Paternal Grandmother   . Hypertension Paternal Grandfather     Social History   Socioeconomic History  . Marital status: Single    Spouse name: Not on file  . Number of children: 2  . Years of education: Not on file  . Highest education level: High school graduate  Occupational History  . Not on file  Social Needs  . Financial resource strain: Not hard at all  . Food insecurity:    Worry: Never true    Inability: Never true  . Transportation needs:    Medical: No    Non-medical: No  Tobacco Use  . Smoking status: Never Smoker  . Smokeless tobacco: Never Used  Substance and Sexual Activity  . Alcohol use: No    Alcohol/week: 0.0 standard drinks   . Drug use: No  . Sexual activity: Yes    Partners: Male    Birth control/protection: None  Lifestyle  . Physical activity:    Days per week: 5 days    Minutes per session: 150+ min  . Stress: Rather much  Relationships  . Social connections:    Talks on phone: More than three times a week    Gets together: More than three times a week    Attends religious service: More than 4 times per year    Active member of club or organization: Yes    Attends meetings of clubs or organizations: More than 4 times per year    Relationship status: Never married  . Intimate partner violence:    Fear of current or ex partner: No    Emotionally abused: No    Physically abused: No    Forced sexual activity: No  Other Topics Concern  . Not on file  Social History Narrative  . Not on file     Current Outpatient Medications:  .  albuterol (PROVENTIL HFA;VENTOLIN HFA) 108 (90 Base) MCG/ACT inhaler, Inhale 2 puffs into the lungs every 6 (six) hours as needed for wheezing or shortness of breath., Disp: 18 g, Rfl: 1 .  aspirin EC 81 MG tablet, Take 1 tablet (81 mg total) by mouth daily., Disp: , Rfl:  .  budesonide-formoterol (SYMBICORT) 80-4.5 MCG/ACT inhaler, Inhale 2 puffs into the lungs 2 (two) times daily. This replaces Breo, Disp: 1 Inhaler, Rfl: 12 .  Elastic Bandages & Supports (MEDICAL COMPRESSION STOCKINGS) MISC, Wear from morning to night, but do not sleep in these, Disp: 1 each, Rfl: 2 .  fluticasone (FLONASE) 50 MCG/ACT nasal spray, Place 2 sprays into both nostrils daily., Disp: 16 g, Rfl: 12 .  hydrOXYzine (ATARAX/VISTARIL) 25 MG tablet, Take 1 tablet (25 mg total) by mouth 3 (three) times daily as needed., Disp: 30 tablet, Rfl: 1 .  metroNIDAZOLE (FLAGYL) 500 MG tablet, Take 1 tablet (500 mg total) by mouth 2 (two) times daily., Disp: 14 tablet, Rfl: 0 .  montelukast (SINGULAIR) 10 MG tablet, Take 1 tablet (10 mg total) by mouth at bedtime., Disp: 30 tablet, Rfl: 3 .  pregabalin  (LYRICA) 75 MG capsule, Take 1 capsule (75 mg total) by mouth 2 (two) times daily., Disp: 60 capsule, Rfl: 0 .  vitamin B-12 (CYANOCOBALAMIN) 500 MCG tablet, Take 1 tablet (500 mcg total) by mouth daily., Disp: 100 tablet, Rfl: 1 .  Vitamin D, Ergocalciferol, (DRISDOL) 1.25 MG (50000 UT) CAPS capsule, Take 1 capsule (50,000 Units total) by mouth every 7 (seven) days., Disp: 12 capsule, Rfl:  1  Allergies  Allergen Reactions  . Latex Hives  . Shellfish Allergy Hives    I personally reviewed active problem list, medication list, allergies, family history, social history with the patient/caregiver today.   ROS  Constitutional: Negative for fever or weight change.  Respiratory: Negative for cough and shortness of breath.   Cardiovascular: Negative for chest pain or palpitations.  Gastrointestinal: Negative for abdominal pain, no bowel changes.  Musculoskeletal: Negative for gait problem or joint swelling.  Skin: Negative for rash.  Neurological: Negative for dizziness or headache.  No other specific complaints in a complete review of systems (except as listed in HPI above).  Objective  Vitals:   10/05/18 1035  BP: 136/84  Pulse: 85  Resp: 16  Temp: 98 F (36.7 C)  TempSrc: Oral  SpO2: 99%  Weight: 233 lb 1.6 oz (105.7 kg)  Height: 5\' 3"  (1.6 m)    Body mass index is 41.29 kg/m.  Physical Exam  Constitutional: Patient appears well-developed and well-nourished. Obese  No distress.  HEENT: head atraumatic, normocephalic, pupils equal and reactive to light, neck supple, throat within normal limits Cardiovascular: Normal rate, regular rhythm and normal heart sounds.  No murmur heard. No BLE edema. Pulmonary/Chest: Effort normal and breath sounds normal. No respiratory distress. Abdominal: Soft.  There is no tenderness. Psychiatric: Patient has a normal mood and affect. behavior is normal. Judgment and thought content normal.  Recent Results (from the past 2160 hour(s))  CBC  with Differential/Platelet     Status: None   Collection Time: 09/14/18  3:35 PM  Result Value Ref Range   WBC 7.2 3.8 - 10.8 Thousand/uL   RBC 4.35 3.80 - 5.10 Million/uL   Hemoglobin 12.7 11.7 - 15.5 g/dL   HCT 16.137.3 09.635.0 - 04.545.0 %   MCV 85.7 80.0 - 100.0 fL   MCH 29.2 27.0 - 33.0 pg   MCHC 34.0 32.0 - 36.0 g/dL   RDW 40.912.7 81.111.0 - 91.415.0 %   Platelets 327 140 - 400 Thousand/uL   MPV 10.0 7.5 - 12.5 fL   Neutro Abs 4,140 1,500 - 7,800 cells/uL   Lymphs Abs 2,484 850 - 3,900 cells/uL   Absolute Monocytes 410 200 - 950 cells/uL   Eosinophils Absolute 122 15 - 500 cells/uL   Basophils Absolute 43 0 - 200 cells/uL   Neutrophils Relative % 57.5 %   Total Lymphocyte 34.5 %   Monocytes Relative 5.7 %   Eosinophils Relative 1.7 %   Basophils Relative 0.6 %  Iron, TIBC and Ferritin Panel     Status: None   Collection Time: 09/14/18  3:35 PM  Result Value Ref Range   Iron 63 40 - 190 mcg/dL   TIBC 782329 956250 - 213450 mcg/dL (calc)   %SAT 19 16 - 45 % (calc)   Ferritin 36 16 - 154 ng/mL  Sedimentation rate     Status: None   Collection Time: 09/14/18  3:35 PM  Result Value Ref Range   Sed Rate 19 0 - 20 mm/h  C-reactive protein     Status: Abnormal   Collection Time: 09/14/18  3:35 PM  Result Value Ref Range   CRP 11.3 (H) <8.0 mg/L  ANA,IFA RA Diag Pnl w/rflx Tit/Patn     Status: None   Collection Time: 09/14/18  3:35 PM  Result Value Ref Range   Anti Nuclear Antibody (ANA) NEGATIVE NEGATIVE    Comment: ANA IFA is a first line screen for detecting the presence of  up to approximately 150 autoantibodies in various autoimmune diseases. A negative ANA IFA result suggests an ANA-associated autoimmune disease is not present at this time, but is not definitive. If there is high clinical suspicion for Sjogren's syndrome, testing for anti-SS-A/Ro antibody should be considered. Anti-Jo-1 antibody should be considered for clinically suspected inflammatory myopathies. . AC-0:  Negative . International Consensus on ANA Patterns (SeverTies.uy) . For additional information, please refer to http://education.[facility].com/faq/FAQ177 (This link is being provided for informational/ educational purposes only.) .    Rhuematoid fact SerPl-aCnc <14 <14 IU/mL   Cyclic Citrullin Peptide Ab <67 UNITS    Comment: Reference Range Negative:            <20 Weak Positive:       20-39 Moderate Positive:   40-59 Strong Positive:     >59 .    INTERPRETATION      Comment: . There is no serologic evidence for rheumatoid arthritis. The RF and CCP tests each have a sensitivity for established rheumatoid arthritis of approximately 65-70%. .       PHQ2/9: Depression screen [facility] 2/9 10/05/2018 09/14/2018 06/02/2018 03/15/2018 08/03/2017  Decreased Interest 0 0 0 0 0  Down, Depressed, Hopeless 0 0 2 0 0  PHQ - 2 Score 0 0 2 0 0  Altered sleeping 1 3 2  0 -  Tired, decreased energy 1 3 1  0 -  Change in appetite 0 0 0 0 -  Feeling bad or failure about yourself  0 0 0 0 -  Trouble concentrating 0 1 2 0 -  Moving slowly or fidgety/restless 0 0 0 0 -  Suicidal thoughts 0 0 0 0 -  PHQ-9 Score 2 7 7  0 -  Difficult doing work/chores Not difficult at all Somewhat difficult Somewhat difficult Not difficult at all -  Some recent data might be hidden    phq 9 is negative   Fall Risk: Fall Risk  10/05/2018 06/02/2018 03/15/2018 08/03/2017 04/20/2017  Falls in the past year? 0 0 1 Yes No  Number falls in past yr: 0 0 1 1 -  Injury with Fall? 0 0 0 Yes -  Comment - - - brusise -     Functional Status Survey: Is the patient deaf or have difficulty hearing?: No Does the patient have difficulty seeing, even when wearing glasses/contacts?: Yes Does the patient have difficulty concentrating, remembering, or making decisions?: No Does the patient have difficulty walking or climbing stairs?: No Does the patient have difficulty dressing or bathing?:  No Does the patient have difficulty doing errands alone such as visiting a doctor's office or shopping?: No    Assessment & Plan  1. Neuropathy involving both lower extremities  - pregabalin (LYRICA) 75 MG capsule; Take 1 capsule (75 mg total) by mouth 2 (two) times daily.  Dispense: 180 capsule; Refill: 0  2. Other insomnia  - pregabalin (LYRICA) 75 MG capsule; Take 1 capsule (75 mg total) by mouth 2 (two) times daily.  Dispense: 180 capsule; Refill: 0  3. Incidental lung nodule, > 38mm and < 94mm  Having   4. Palpitations   5. Atypical chest pain  Keep follow up with Dr. Okey Dupre   6. Vitamin B12 deficiency  - Cyanocobalamin (B-12) 1000 MCG SUBL; Place 1 tablet under the tongue daily.  Dispense: 30 each; Refill: 5

## 2018-10-06 ENCOUNTER — Other Ambulatory Visit: Payer: Self-pay

## 2018-10-06 ENCOUNTER — Ambulatory Visit
Admission: RE | Admit: 2018-10-06 | Discharge: 2018-10-06 | Disposition: A | Discharge status: Home / Self Care | Payer: Managed Care, Other (non HMO) | Source: Ambulatory Visit | Attending: Internal Medicine | Admitting: Internal Medicine

## 2018-10-06 DIAGNOSIS — R918 Other nonspecific abnormal finding of lung field: Secondary | ICD-10-CM | POA: Diagnosis not present

## 2018-10-12 ENCOUNTER — Other Ambulatory Visit: Payer: Self-pay

## 2018-10-12 ENCOUNTER — Ambulatory Visit
Admission: RE | Admit: 2018-10-12 | Discharge: 2018-10-12 | Disposition: A | Discharge status: Home / Self Care | Payer: Managed Care, Other (non HMO) | Source: Ambulatory Visit | Attending: Internal Medicine | Admitting: Internal Medicine

## 2018-10-12 DIAGNOSIS — R0602 Shortness of breath: Secondary | ICD-10-CM

## 2018-10-12 DIAGNOSIS — I2584 Coronary atherosclerosis due to calcified coronary lesion: Secondary | ICD-10-CM | POA: Diagnosis not present

## 2018-10-12 DIAGNOSIS — I2510 Atherosclerotic heart disease of native coronary artery without angina pectoris: Secondary | ICD-10-CM

## 2018-10-12 LAB — NM MYOCAR MULTI W/SPECT W/WALL MOTION / EF
Estimated workload: 1 METS
Exercise duration (min): 0 min
Exercise duration (sec): 0 s
LV dias vol: 125 mL (ref 46–106)
LV sys vol: 56 mL
MPHR: 182 {beats}/min
Peak HR: 144 {beats}/min
Percent HR: 79 %
Rest HR: 75 {beats}/min
TID: 0.98

## 2018-10-12 MED ORDER — TECHNETIUM TC 99M TETROFOSMIN IV KIT
10.0000 | PACK | Freq: Once | INTRAVENOUS | Status: AC | PRN
  Administered 2018-10-12: 10.87 via INTRAVENOUS

## 2018-10-12 MED ORDER — REGADENOSON 0.4 MG/5ML IV SOLN
0.4000 mg | Freq: Once | INTRAVENOUS | Status: AC
  Administered 2018-10-12: 0.4 mg via INTRAVENOUS

## 2018-10-12 MED ORDER — TECHNETIUM TC 99M TETROFOSMIN IV KIT
30.0000 | PACK | Freq: Once | INTRAVENOUS | Status: AC | PRN
  Administered 2018-10-12: 32.18 via INTRAVENOUS

## 2018-10-21 NOTE — Progress Notes (Signed)
Greater than 5 minutes, yet less than 10 minutes of time have been spent researching, coordinating, and implementing care for this patient today.  Thank you for the details you included in the comment boxes. Those details are very helpful in determining the best course of treatment for you and help us to provide the best care.  

## 2018-11-07 ENCOUNTER — Ambulatory Visit (INDEPENDENT_AMBULATORY_CARE_PROVIDER_SITE_OTHER): Payer: Managed Care, Other (non HMO) | Admitting: Gastroenterology

## 2018-11-07 ENCOUNTER — Telehealth: Payer: Self-pay | Admitting: Internal Medicine

## 2018-11-07 ENCOUNTER — Encounter: Payer: Self-pay | Admitting: Gastroenterology

## 2018-11-07 ENCOUNTER — Telehealth: Payer: Self-pay | Admitting: Gastroenterology

## 2018-11-07 ENCOUNTER — Other Ambulatory Visit: Payer: Self-pay

## 2018-11-07 DIAGNOSIS — D509 Iron deficiency anemia, unspecified: Secondary | ICD-10-CM | POA: Diagnosis not present

## 2018-11-07 DIAGNOSIS — R1310 Dysphagia, unspecified: Secondary | ICD-10-CM

## 2018-11-07 DIAGNOSIS — R1319 Other dysphagia: Secondary | ICD-10-CM

## 2018-11-07 MED ORDER — NA SULFATE-K SULFATE-MG SULF 17.5-3.13-1.6 GM/177ML PO SOLN: 1.0000 | Freq: Once | ORAL | 0 refills | Status: AC

## 2018-11-07 NOTE — Telephone Encounter (Signed)
I called patient & l/m to return call.Patient had a virtual appt with Dr Bonna Gains on 11-07-18 & needs a 4-6 wk f/u.

## 2018-11-07 NOTE — Progress Notes (Signed)
Melodie BouillonVarnita Hser Belanger, MD [address]  [address]  [place], [place] [address]  Main: [phone]  Fax: [phone]   Primary Care Physician: Kerman PasseyLada, Melinda P, MD  Virtual Visit via Video Note  I connected with patient on 11/07/18 at  9:30 AM EDT by video (using [email or web address]) and verified that I am speaking with the correct person using two identifiers.   I discussed the limitations, risks, security and privacy concerns of performing an evaluation and management service by video and the availability of in person appointments. I also discussed with the patient that there may be a patient responsible charge related to this service. The patient expressed understanding and agreed to proceed.  Location of Patient: Home Location of Provider: Home Persons involved: Patient and provider only (Nursing staff checked in patient via phone but were not physically involved in the video interaction - see their notes)   History of Present Illness: Chief complaint: Iron deficiency anemia  HPI: Catherine Berg is a 38 y.o. female being seen for follow-up of iron deficiency anemia.  No episodes of bleeding.  Patient states she does not have a monthly period.  She was awaiting cardiac work-up by Dr. Okey DupreEnd, which she states she had done last week and stress test was clear.  We do not have these results.   Patient had recent blood work in January 2020 that showed hemoglobin of 11.7 with normal MCV.  Low iron level at 38, percent sat of 12% and ferritin of 29.  Patient did not take any specific iron replacement since then but was asked to increase dietary iron intake and repeat labs in May 2020 shows normal hemoglobin and improvement in iron panel.  Current Outpatient Medications  Medication Sig Dispense Refill  . albuterol (PROVENTIL HFA;VENTOLIN HFA) 108 (90 Base) MCG/ACT inhaler Inhale 2 puffs into the lungs every 6 (six) hours as needed for wheezing or shortness of breath. 18 g 1  . aspirin EC 81 MG tablet  Take 1 tablet (81 mg total) by mouth daily.    . budesonide-formoterol (SYMBICORT) 80-4.5 MCG/ACT inhaler Inhale 2 puffs into the lungs 2 (two) times daily. This replaces Breo 1 Inhaler 12  . Cyanocobalamin (B-12) 1000 MCG SUBL Place 1 tablet under the tongue daily. 30 each 5  . Elastic Bandages & Supports (MEDICAL COMPRESSION STOCKINGS) MISC Wear from morning to night, but do not sleep in these 1 each 2  . fluticasone (FLONASE) 50 MCG/ACT nasal spray Place 2 sprays into both nostrils daily. 16 g 12  . hydrOXYzine (ATARAX/VISTARIL) 25 MG tablet Take 1 tablet (25 mg total) by mouth 3 (three) times daily as needed. 30 tablet 1  . montelukast (SINGULAIR) 10 MG tablet Take 1 tablet (10 mg total) by mouth at bedtime. 30 tablet 3  . pregabalin (LYRICA) 75 MG capsule Take 1 capsule (75 mg total) by mouth 2 (two) times daily. 180 capsule 0  . Vitamin D, Ergocalciferol, (DRISDOL) 1.25 MG (50000 UT) CAPS capsule Take 1 capsule (50,000 Units total) by mouth every 7 (seven) days. 12 capsule 1   No current facility-administered medications for this visit.     Allergies as of 11/07/2018 - Review Complete 10/05/2018  Allergen Reaction Noted  . Latex Hives 07/30/2014  . Shellfish allergy Hives 07/30/2014    Review of Systems:    All systems reviewed and negative except where noted in HPI.   Observations/Objective:  Labs: CMP     Component Value Date/Time   NA 137 05/31/2018  2332   NA 137 07/21/2016 0000   NA 134 (L) 08/13/2012 2254   K 3.5 05/31/2018 2332   K 3.9 08/13/2012 2254   CL 107 05/31/2018 2332   CL 105 08/13/2012 2254   CO2 24 05/31/2018 2332   CO2 23 08/13/2012 2254   GLUCOSE 93 05/31/2018 2332   GLUCOSE 101 (H) 08/13/2012 2254   BUN 16 05/31/2018 2332   BUN 9 07/21/2016 0000   BUN 10 08/13/2012 2254   CREATININE 0.86 05/31/2018 2332   CREATININE 0.80 03/15/2018 0913   CALCIUM 8.7 (L) 05/31/2018 2332   CALCIUM 9.0 08/13/2012 2254   PROT 7.5 05/31/2018 2332   PROT 7.2  07/21/2016 0000   ALBUMIN 4.0 05/31/2018 2332   ALBUMIN 3.9 07/21/2016 0000   AST 22 05/31/2018 2332   ALT 20 05/31/2018 2332   ALKPHOS 60 05/31/2018 2332   BILITOT 0.7 05/31/2018 2332   BILITOT 0.2 07/21/2016 0000   GFRNONAA >60 05/31/2018 2332   GFRNONAA 97 08/03/2017 1154   GFRAA >60 05/31/2018 2332   GFRAA 113 08/03/2017 1154   Lab Results  Component Value Date   WBC 7.2 09/14/2018   HGB 12.7 09/14/2018   HCT 37.3 09/14/2018   MCV 85.7 09/14/2018   PLT 327 09/14/2018    Imaging Studies: Nm Myocar Multi W/spect W/wall Motion / Ef  Result Date: 10/12/2018  Normal pharmacologic myocardial perfusion stress without significant ischemia or scar.  The left ventricular ejection fraction is normal (55%).  There is no significant coronary artery calcification on the attenuation correction CT.  This is a low risk study.     Assessment and Plan:   Catherine Berg is a 38 y.o. y/o female with iron deficiency anemia  Assessment and Plan: We will obtain cardiac clearance from cardiology and schedule patient's procedure in the upcoming weeks  Patient denies any episodes of bleeding  Patient also reports intermittent dysphagia but states it is to meats only.  I have asked her to avoid any foods or meats that cause dysphagia to prevent episodes of food impaction.  At the time of procedure patient may need dilation or biopsies for EOE.  Patient advised to chew her food well and following with liquids as well.  Eat small bites.  I have discussed alternative options, risks & benefits,  which include, but are not limited to, bleeding, infection, perforation,respiratory complication & drug reaction.  The patient agrees with this plan & written consent will be obtained.     Follow Up Instructions: Follow-up in 6 to 8 weeks   I discussed the assessment and treatment plan with the patient. The patient was provided an opportunity to ask questions and all were answered. The patient agreed  with the plan and demonstrated an understanding of the instructions.   The patient was advised to call back or seek an in-person evaluation if the symptoms worsen or if the condition fails to improve as anticipated.  I provided 15 minutes of face-to-face time via video software during this encounter. Additional time was spent in reviewing patient's chart, placing orders etc.   Virgel Manifold, MD  Speech recognition software was used to dictate this note.

## 2018-11-07 NOTE — Telephone Encounter (Signed)
° °  [facility] Pre-operative Risk Assessment    Request for surgical clearance:  1. What type of surgery is being performed? Colonoscopy/ EGD  2. When is this surgery scheduled? TBD  3. What type of clearance is required (medical clearance vs. Pharmacy clearance to hold med vs. Both)? both  4. Are there any medications that need to be held prior to surgery and how long? Not noted  5. Practice name and name of physician performing surgery? [facility]   6. What is your office phone number [phone]   7.   What is your office fax number [phone]  8.   Anesthesia type (None, local, MAC, general) ? general   Catherine Berg 11/07/2018, 2:32 PM  _________________________________________________________________   (provider comments below)

## 2018-11-07 NOTE — Telephone Encounter (Signed)
   Primary Cardiologist: Nelva Bush, MD  Chart reviewed as part of pre-operative protocol coverage. Given past medical history and time since last visit, based on ACC/AHA guidelines, Catherine Berg would be at acceptable risk for the planned procedure without further cardiovascular testing. Recent stress test was normal. Recent stress test was normal. Patient is not on any antiplatelet agent or anticoagulation therapy.   I will route this recommendation to the requesting party via Epic fax function and remove from pre-op pool.  Please call with questions.  [place], [place] 11/07/2018, 4:57 PM

## 2018-11-08 ENCOUNTER — Encounter: Payer: Self-pay | Admitting: *Deleted

## 2018-11-10 ENCOUNTER — Encounter: Payer: Self-pay | Admitting: *Deleted

## 2018-11-10 ENCOUNTER — Other Ambulatory Visit: Payer: Self-pay

## 2018-11-10 ENCOUNTER — Other Ambulatory Visit
Admission: RE | Admit: 2018-11-10 | Discharge: 2018-11-10 | Disposition: A | Discharge status: Home / Self Care | Payer: Managed Care, Other (non HMO) | Source: Ambulatory Visit | Attending: Gastroenterology | Admitting: Gastroenterology

## 2018-11-10 DIAGNOSIS — Z1159 Encounter for screening for other viral diseases: Secondary | ICD-10-CM | POA: Diagnosis not present

## 2018-11-10 DIAGNOSIS — Z01812 Encounter for preprocedural laboratory examination: Secondary | ICD-10-CM | POA: Diagnosis not present

## 2018-11-11 ENCOUNTER — Encounter: Payer: Self-pay | Admitting: Family Medicine

## 2018-11-11 LAB — SARS CORONAVIRUS 2 (TAT 6-24 HRS): SARS Coronavirus 2: NEGATIVE

## 2018-11-11 NOTE — Discharge Instructions (Signed)

## 2018-11-14 ENCOUNTER — Ambulatory Visit: Payer: Managed Care, Other (non HMO) | Admitting: Anesthesiology

## 2018-11-14 ENCOUNTER — Encounter
Admission: RE | Disposition: A | Discharge status: Home / Self Care | Payer: Self-pay | Source: Home / Self Care | Attending: Gastroenterology

## 2018-11-14 ENCOUNTER — Ambulatory Visit
Admission: RE | Admit: 2018-11-14 | Discharge: 2018-11-14 | Disposition: A | Discharge status: Home / Self Care | Payer: Managed Care, Other (non HMO) | Attending: Gastroenterology | Admitting: Gastroenterology

## 2018-11-14 ENCOUNTER — Other Ambulatory Visit: Payer: Self-pay

## 2018-11-14 DIAGNOSIS — D509 Iron deficiency anemia, unspecified: Secondary | ICD-10-CM | POA: Diagnosis present

## 2018-11-14 DIAGNOSIS — J45909 Unspecified asthma, uncomplicated: Secondary | ICD-10-CM | POA: Insufficient documentation

## 2018-11-14 DIAGNOSIS — Z6841 Body Mass Index (BMI) 40.0 and over, adult: Secondary | ICD-10-CM | POA: Diagnosis not present

## 2018-11-14 DIAGNOSIS — E559 Vitamin D deficiency, unspecified: Secondary | ICD-10-CM | POA: Insufficient documentation

## 2018-11-14 DIAGNOSIS — I10 Essential (primary) hypertension: Secondary | ICD-10-CM | POA: Insufficient documentation

## 2018-11-14 DIAGNOSIS — E538 Deficiency of other specified B group vitamins: Secondary | ICD-10-CM | POA: Diagnosis not present

## 2018-11-14 DIAGNOSIS — R1319 Other dysphagia: Secondary | ICD-10-CM

## 2018-11-14 DIAGNOSIS — Z79899 Other long term (current) drug therapy: Secondary | ICD-10-CM | POA: Insufficient documentation

## 2018-11-14 DIAGNOSIS — R1310 Dysphagia, unspecified: Secondary | ICD-10-CM

## 2018-11-14 DIAGNOSIS — E669 Obesity, unspecified: Secondary | ICD-10-CM | POA: Insufficient documentation

## 2018-11-14 DIAGNOSIS — K6389 Other specified diseases of intestine: Secondary | ICD-10-CM

## 2018-11-14 DIAGNOSIS — Z793 Long term (current) use of hormonal contraceptives: Secondary | ICD-10-CM | POA: Diagnosis not present

## 2018-11-14 HISTORY — PX: COLONOSCOPY WITH PROPOFOL: SHX5780

## 2018-11-14 HISTORY — DX: Other complications of anesthesia, initial encounter: T88.59XA

## 2018-11-14 HISTORY — DX: Presence of dental prosthetic device (complete) (partial): Z97.2

## 2018-11-14 HISTORY — DX: Unspecified asthma, uncomplicated: J45.909

## 2018-11-14 HISTORY — DX: Anemia, unspecified: D64.9

## 2018-11-14 HISTORY — PX: ESOPHAGOGASTRODUODENOSCOPY (EGD) WITH PROPOFOL: SHX5813

## 2018-11-14 SURGERY — COLONOSCOPY WITH PROPOFOL
Anesthesia: General

## 2018-11-14 MED ORDER — SODIUM CHLORIDE 0.9 % IV SOLN: INTRAVENOUS | Status: DC

## 2018-11-14 MED ORDER — DIPHENHYDRAMINE HCL 50 MG/ML IJ SOLN
INTRAMUSCULAR | Status: DC | PRN
  Administered 2018-11-14: 12.5 mg via INTRAVENOUS

## 2018-11-14 MED ORDER — STERILE WATER FOR IRRIGATION IR SOLN
Status: DC | PRN
  Administered 2018-11-14: 15 mL

## 2018-11-14 MED ORDER — PROPOFOL 10 MG/ML IV BOLUS
INTRAVENOUS | Status: DC | PRN
  Administered 2018-11-14: 200 mg via INTRAVENOUS
  Administered 2018-11-14 (×3): 100 mg via INTRAVENOUS
  Administered 2018-11-14: 200 mg via INTRAVENOUS

## 2018-11-14 MED ORDER — LACTATED RINGERS IV SOLN
INTRAVENOUS | Status: DC
  Administered 2018-11-14: 11:00:00 via INTRAVENOUS

## 2018-11-14 MED ORDER — DEXAMETHASONE SODIUM PHOSPHATE 4 MG/ML IJ SOLN
INTRAMUSCULAR | Status: DC | PRN
  Administered 2018-11-14: 6 mg via INTRAVENOUS

## 2018-11-14 MED ORDER — LIDOCAINE HCL (CARDIAC) PF 100 MG/5ML IV SOSY
PREFILLED_SYRINGE | INTRAVENOUS | Status: DC | PRN
  Administered 2018-11-14 (×2): 30 mg via INTRAVENOUS

## 2018-11-14 MED ORDER — ONDANSETRON HCL 4 MG/2ML IJ SOLN
INTRAMUSCULAR | Status: DC | PRN
  Administered 2018-11-14: 4 mg via INTRAVENOUS

## 2018-11-14 MED ORDER — ACETAMINOPHEN 160 MG/5ML PO SOLN: 325.0000 mg | Freq: Once | ORAL | Status: DC

## 2018-11-14 MED ORDER — ACETAMINOPHEN 325 MG PO TABS: 325.0000 mg | ORAL_TABLET | Freq: Once | ORAL | Status: DC

## 2018-11-14 MED ORDER — GLYCOPYRROLATE 0.2 MG/ML IJ SOLN
INTRAMUSCULAR | Status: DC | PRN
  Administered 2018-11-14: 0.2 mg via INTRAVENOUS

## 2018-11-14 SURGICAL SUPPLY — 37 items
BALLN DILATOR 10-12 8 (BALLOONS)
BALLN DILATOR 12-15 8 (BALLOONS)
BALLN DILATOR 15-18 8 (BALLOONS)
BALLN DILATOR CRE 0-12 8 (BALLOONS)
BALLN DILATOR ESOPH 8 10 CRE (MISCELLANEOUS) IMPLANT
BALLOON DILATOR 12-15 8 (BALLOONS) IMPLANT
BALLOON DILATOR 15-18 8 (BALLOONS) IMPLANT
BALLOON DILATOR CRE 0-12 8 (BALLOONS) IMPLANT
BLOCK BITE 60FR ADLT L/F GRN (MISCELLANEOUS) ×2 IMPLANT
CANISTER SUCT 1200ML W/VALVE (MISCELLANEOUS) ×2 IMPLANT
CLIP HMST 235XBRD CATH ROT (MISCELLANEOUS) IMPLANT
CLIP RESOLUTION 360 11X235 (MISCELLANEOUS)
ELECT REM PT RETURN 9FT ADLT (ELECTROSURGICAL)
ELECTRODE REM PT RTRN 9FT ADLT (ELECTROSURGICAL) IMPLANT
FCP ESCP3.2XJMB 240X2.8X (MISCELLANEOUS)
FORCEPS BIOP RAD 4 LRG CAP 4 (CUTTING FORCEPS) ×2 IMPLANT
FORCEPS BIOP RJ4 240 W/NDL (MISCELLANEOUS)
FORCEPS ESCP3.2XJMB 240X2.8X (MISCELLANEOUS) IMPLANT
GAUZE SPONGE 4X4 12PLY STRL (GAUZE/BANDAGES/DRESSINGS) ×2 IMPLANT
GOWN CVR UNV OPN BCK APRN NK (MISCELLANEOUS) ×2 IMPLANT
GOWN ISOL THUMB LOOP REG UNIV (MISCELLANEOUS) ×2
INJECTOR VARIJECT VIN23 (MISCELLANEOUS) IMPLANT
KIT DEFENDO VALVE AND CONN (KITS) IMPLANT
KIT ENDO PROCEDURE OLY (KITS) ×2 IMPLANT
MARKER SPOT ENDO TATTOO 5ML (MISCELLANEOUS) IMPLANT
PROBE APC STR FIRE (PROBE) IMPLANT
RETRIEVER NET PLAT FOOD (MISCELLANEOUS) IMPLANT
RETRIEVER NET ROTH 2.5X230 LF (MISCELLANEOUS) IMPLANT
SNARE SHORT THROW 13M SML OVAL (MISCELLANEOUS) IMPLANT
SNARE SHORT THROW 30M LRG OVAL (MISCELLANEOUS) IMPLANT
SNARE SNG USE RND 15MM (INSTRUMENTS) IMPLANT
SPOT EX ENDOSCOPIC TATTOO (MISCELLANEOUS)
SYR INFLATION 60ML (SYRINGE) IMPLANT
TRAP ETRAP POLY (MISCELLANEOUS) IMPLANT
VARIJECT INJECTOR VIN23 (MISCELLANEOUS)
WATER STERILE IRR 250ML POUR (IV SOLUTION) ×2 IMPLANT
WIRE CRE 18-20MM 8CM F G (MISCELLANEOUS) IMPLANT

## 2018-11-14 NOTE — Anesthesia Procedure Notes (Signed)
Procedure Name: General with mask airway Performed by: Dolora Ridgely M, CRNA Pre-anesthesia Checklist: Patient identified, Emergency Drugs available, Suction available, Patient being monitored and Timeout performed Patient Re-evaluated:Patient Re-evaluated prior to induction Oxygen Delivery Method: Nasal cannula       

## 2018-11-14 NOTE — H&P (Signed)
Catherine Antigua, MD [address] [place], [place], [place], [address] [address], [place], [place], [place], [address] Phone: [phone]  Fax: [phone]  Primary Care Physician:  Arnetha Courser, MD   Pre-Procedure History & Physical: HPI:  Catherine Berg is a 38 y.o. female is here for a colonoscopy and EGD.   Past Medical History:  Diagnosis Date  . Abnormal thyroid blood test   . Anemia   . Anxiety   . Asthma   . Complication of anesthesia    itching after c sections  . 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 Graves disease   . History of abnormal cervical Pap smear   . History of cervical polypectomy   . Hives 09/02/2015  . Hypertension   . IFG (impaired fasting glucose)   . Incidental lung nodule, > 80mm and < 54mm 03/31/2017   4 mm RLL lung nodule on chest CT Mar 31, 2017  . Low serum vitamin D   . Obesity   . Pregnancy induced hypertension    with 1st pregnancy, normal pressure with 2nd  . Reflux   . Thyroid disease   . Vitamin B12 deficiency   . Vitamin D deficiency disease   . Wears dentures    full upper    Past Surgical History:  Procedure Laterality Date  . CERVICAL POLYPECTOMY    . CESAREAN SECTION     x 2    Prior to Admission medications   Medication Sig Start Date End Date Taking? Authorizing Provider  albuterol (PROVENTIL HFA;VENTOLIN HFA) 108 (90 Base) MCG/ACT inhaler Inhale 2 puffs into the lungs every 6 (six) hours as needed for wheezing or shortness of breath. 06/28/18  Yes Laverle Hobby, MD  Etonogestrel (NEXPLANON [place]) Inject into the skin.   Yes [provider]  fluticasone (FLONASE) 50 MCG/ACT nasal spray Place 2 sprays into both nostrils daily. 09/22/16  Yes Lada, Satira Anis, MD  hydrOXYzine (ATARAX/VISTARIL) 25 MG tablet Take 1 tablet (25 mg total) by mouth 3 (three) times daily as needed. 09/14/18  Yes Sowles, Drue Stager, MD  montelukast (SINGULAIR) 10 MG tablet Take 1 tablet (10 mg total) by  mouth at bedtime. 06/22/18  Yes Laverle Hobby, MD  pregabalin (LYRICA) 75 MG capsule Take 1 capsule (75 mg total) by mouth 2 (two) times daily. 10/05/18  Yes Sowles, Drue Stager, MD  Vitamin D, Ergocalciferol, (DRISDOL) 1.25 MG (50000 UT) CAPS capsule Take 1 capsule (50,000 Units total) by mouth every 7 (seven) days. 09/14/18  Yes Sowles, Drue Stager, MD  budesonide-formoterol (SYMBICORT) 80-4.5 MCG/ACT inhaler Inhale 2 puffs into the lungs 2 (two) times daily. This replaces Breo Patient not taking: Reported on 11/08/2018 03/05/17   Arnetha Courser, MD  Cyanocobalamin (B-12) 1000 MCG SUBL Place 1 tablet under the tongue daily. Patient not taking: Reported on 11/08/2018 10/05/18   Steele Sizer, MD  Elastic Bandages & Supports ([place]) MISC Wear from morning to night, but do not sleep in these 04/20/17   Arnetha Courser, MD    Allergies as of 11/07/2018 - Review Complete 11/07/2018  Allergen Reaction Noted  . Latex Hives 07/30/2014  . Shellfish allergy Hives 07/30/2014    Family History  Problem Relation Age of Onset  . Heart attack Mother 42  . Hypertension Mother   . Asthma Mother   . Cancer Mother        laryngeal  . Diabetes Mother        pre diabetic  .  Heart disease Mother   . Mental illness Mother   . Alcohol abuse Mother   . Drug abuse Mother   . Depression Mother   . Anxiety disorder Mother   . Bipolar disorder Mother   . Learning disabilities Brother   . ADD / ADHD Brother   . Asthma Son   . Hyperlipidemia Brother   . Alcohol abuse Father   . Heart disease Maternal Grandmother        heart attack, pacemaker  . Heart attack Maternal Grandmother   . Hypertension Maternal Grandmother   . Hypertension Maternal Grandfather   . Heart disease Paternal Grandmother        couple of major open heart surgeries, leaking valves  . Hypertension Paternal Grandmother   . Hypertension Paternal Grandfather     Social History   Socioeconomic History  . Marital  status: Single    Spouse name: Not on file  . Number of children: 2  . Years of education: Not on file  . Highest education level: High school graduate  Occupational History  . Not on file  Social Needs  . Financial resource strain: Not hard at all  . Food insecurity    Worry: Never true    Inability: Never true  . Transportation needs    Medical: No    Non-medical: No  Tobacco Use  . Smoking status: Never Smoker  . Smokeless tobacco: Never Used  Substance and Sexual Activity  . Alcohol use: No    Alcohol/week: 0.0 standard drinks  . Drug use: No  . Sexual activity: Yes    Partners: Male    Birth control/protection: None  Lifestyle  . Physical activity    Days per week: 5 days    Minutes per session: 150+ min  . Stress: Rather much  Relationships  . Social connections    Talks on phone: More than three times a week    Gets together: More than three times a week    Attends religious service: More than 4 times per year    Active member of club or organization: Yes    Attends meetings of clubs or organizations: More than 4 times per year    Relationship status: Never married  . Intimate partner violence    Fear of current or ex partner: No    Emotionally abused: No    Physically abused: No    Forced sexual activity: No  Other Topics Concern  . Not on file  Social History Narrative  . Not on file    Review of Systems: See HPI, otherwise negative ROS  Physical Exam: BP (!) 120/93   Pulse 80   Temp 98.4 F (36.9 C) (Temporal)   Resp 18   Ht 5\' 3"  (1.6 m)   Wt 104.3 kg   SpO2 100%   BMI 40.73 kg/m  General:   Alert,  pleasant and cooperative in NAD Head:  Normocephalic and atraumatic. Neck:  Supple; no masses or thyromegaly. Lungs:  Clear throughout to auscultation, normal respiratory effort.    Heart:  +S1, +S2, Regular rate and rhythm, No edema. Abdomen:  Soft, nontender and nondistended. Normal bowel sounds, without guarding, and without rebound.    Neurologic:  Alert and  oriented x4;  grossly normal neurologically.  Impression/Plan: Catherine Berg is here for a colonoscopy and EGD for iron deficiency anemia  Risks, benefits, limitations, and alternatives regarding the procedures have been reviewed with the patient.  Questions have been answered.  All parties agreeable.   Pasty SpillersVarnita B Adara Kittle, MD  11/14/2018, 12:01 PM

## 2018-11-14 NOTE — Op Note (Signed)
[facility] Gastroenterology Patient Name: Catherine Berg Procedure Date: 11/14/2018 12:34 PM MRN: [id number] Account #: [id number] Date of Birth: 09/19/1980 Admit Type: Outpatient Age: [age] [facility] Gender: Female Note Status: Finalized Procedure:            Colonoscopy Indications:          Iron deficiency anemia Providers:            Azhia Siefken B. Maximino Greenlandahiliani MD, MD Medicines:            Monitored Anesthesia Care Complications:        No immediate complications. Procedure:            Pre-Anesthesia Assessment:                       - Prior to the procedure, a History and Physical was                        performed, and patient medications, allergies and                        sensitivities were reviewed. The patient's tolerance of                        previous anesthesia was reviewed.                       - The risks and benefits of the procedure and the                        sedation options and risks were discussed with the                        patient. All questions were answered and informed                        consent was obtained.                       - Patient identification and proposed procedure were                        verified prior to the procedure by the physician, the                        nurse, the anesthetist and the technician. The                        procedure was verified in the pre-procedure area in the                        procedure room in the endoscopy suite.                       - ASA Grade Assessment: II - A patient with mild                        systemic disease.                       - After reviewing the risks and benefits, the patient  was deemed in satisfactory condition to undergo the                        procedure.                       After obtaining informed consent, the colonoscope was                        passed under direct vision. Throughout the procedure,               the patient's blood pressure, pulse, and oxygen                        saturations were monitored continuously. The                        Colonoscope was introduced through the anus and                        advanced to the the cecum, identified by appendiceal                        orifice and ileocecal valve. The colonoscopy was                        performed with ease. The patient tolerated the                        procedure well. The quality of the bowel preparation                        was good. Findings:      The perianal and digital rectal examinations were normal.      A localized area of mildly thickened folds of the mucosa was found in       the cecum. Biopsies were taken with a cold forceps for histology.      The rectum, sigmoid colon, descending colon, transverse colon, ascending       colon and cecum appeared normal.      Retroflexion in the rectum was not performed due to Narrow rectum. Impression:           - - Thickened folds of the mucosa in the cecum.                        Biopsied. The biopsy results are expected to benign.                       - The rectum, sigmoid colon, descending colon,                        transverse colon, ascending colon and cecum are normal. Recommendation:       - Discharge patient to home.                       - Resume previous diet.                       - Continue present medications.                       -  Repeat colonoscopy in 7 years (pt will be 38 then)                        for screening purposes.                       - Return to primary care physician as previously                        scheduled.                       - The findings and recommendations were discussed with                        the patient.                       - The findings and recommendations were discussed with                        the patient's family.                       - Await pathology results. Procedure Code(s):    ---  Professional ---                       [id number], Colonoscopy, flexible; with biopsy, single or                        multiple Diagnosis Code(s):    --- Professional ---                       K63.89, Other specified diseases of intestine                       D50.9, Iron deficiency anemia, unspecified CPT copyright 2019 American Medical Association. All rights reserved. The codes documented in this report are preliminary and upon coder review may  be revised to meet current compliance requirements.  Melodie BouillonVarnita Zykeem Bauserman, MD Michel BickersVarnita B. Maximino Greenlandahiliani MD, MD 11/14/2018 1:07:24 PM This report has been signed electronically. Number of Addenda: 0 Note Initiated On: 11/14/2018 12:34 PM Scope Withdrawal Time: 0 hours 13 minutes 2 seconds  Total Procedure Duration: 0 hours 26 minutes 16 seconds  Estimated Blood Loss: Estimated blood loss: none.      [facility]

## 2018-11-14 NOTE — Op Note (Signed)
[facility] Gastroenter[facility] Patient Name: Catherine Berg Procedure Date: 11/14/2018 11:59 AM MRN: [id number] Account #: [id number] Date of Birth: 01/19/1981 Admit Type: Outpatient Age: 38 [facility] Gender: Female Note Status: Finalized Procedure:            Upper GI endoscopy Indications:          Iron deficiency anemia, Dysphagia Providers:            Earlee Herald B. Maximino Greenlandahiliani MD, MD Referring MD:         Kerman PasseyMelinda P. Lada (Referring MD) Medicines:            Monitored Anesthesia Care Complications:        No immediate complications. Procedure:            Pre-Anesthesia Assessment:                       - Prior to the procedure, a History and Physical was                        performed, and patient medications, allergies and                        sensitivities were reviewed. The patient's tolerance of                        previous anesthesia was reviewed.                       - The risks and benefits of the procedure and the                        sedation options and risks were discussed with the                        patient. All questions were answered and informed                        consent was obtained.                       - Patient identification and proposed procedure were                        verified prior to the procedure by the physician, the                        nurse, the anesthesiologist, the anesthetist and the                        technician. The procedure was verified in the procedure                        room.                       - ASA Grade Assessment: II - A patient with mild                        systemic disease.  After obtaining informed consent, the endoscope was                        passed under direct vision. Throughout the procedure,                        the patient's blood pressure, pulse, and oxygen                        saturations were monitored continuously. The           Endosonoscope was introduced through the mouth, and                        advanced to the second part of duodenum. The upper GI                        endoscopy was accomplished with ease. The patient                        tolerated the procedure well. Findings:      The examined esophagus was normal. Biopsies were obtained from the       proximal and distal esophagus with cold forceps for histology of       suspected eosinophilic esophagitis.      The entire examined stomach was normal.      The duodenal bulb, second portion of the duodenum and examined duodenum       were normal. Biopsies for histology were taken with a cold forceps for       evaluation of celiac disease. Impression:           - Normal esophagus. Biopsied.                       - Normal stomach.                       - Normal duodenal bulb, second portion of the duodenum                        and examined duodenum. Biopsied. Recommendation:       - Await pathology results.                       - Discharge patient to home (with escort).                       - Advance diet as tolerated.                       - Continue present medications.                       - Patient has a contact number available for                        emergencies. The signs and symptoms of potential                        delayed complications were discussed with the patient.  Return to normal activities tomorrow. Written discharge                        instructions were provided to the patient.                       - Discharge patient to home (with escort).                       - The findings and recommendations were discussed with                        the patient.                       - The findings and recommendations were discussed with                        the patient's family. Procedure Code(s):    --- Professional ---                       [id number], Esophagogastroduodenoscopy, flexible,  transoral;                        with biopsy, single or multiple Diagnosis Code(s):    --- Professional ---                       D50.9, Iron deficiency anemia, unspecified                       R13.10, Dysphagia, unspecified CPT copyright 2019 American Medical Association. All rights reserved. The codes documented in this report are preliminary and upon coder review may  be revised to meet current compliance requirements.  Vonda Antigua, MD Margretta Sidle B. Bonna Gains MD, MD 11/14/2018 12:34:46 PM This report has been signed electronically. Number of Addenda: 0 Note Initiated On: 11/14/2018 11:59 AM Total Procedure Duration: 0 hours 8 minutes 22 seconds  Estimated Blood Loss: Estimated blood loss: none.      [facility]

## 2018-11-14 NOTE — Anesthesia Preprocedure Evaluation (Signed)
Anesthesia Evaluation  Patient identified by MRN, date of birth, ID band Patient awake    Reviewed: Allergy & Precautions, H&P , NPO status , Patient's Chart, lab work & pertinent test results  Airway Mallampati: II  TM Distance: >3 FB Neck ROM: full    Dental  (+) Edentulous Upper, Missing   Pulmonary asthma ,    Pulmonary exam normal breath sounds clear to auscultation       Cardiovascular hypertension, Normal cardiovascular exam Rhythm:regular Rate:Normal     Neuro/Psych    GI/Hepatic   Endo/Other  Hyperthyroidism   Renal/GU      Musculoskeletal   Abdominal   Peds  Hematology  (+) Blood dyscrasia, anemia ,   Anesthesia Other Findings   Reproductive/Obstetrics                             Anesthesia Physical Anesthesia Plan  ASA: II  Anesthesia Plan: General   Post-op Pain Management:    Induction: Intravenous  PONV Risk Score and Plan: 3 and Propofol infusion and Treatment may vary due to age or medical condition  Airway Management Planned: Natural Airway  Additional Equipment:   Intra-op Plan:   Post-operative Plan:   Informed Consent: I have reviewed the patients History and Physical, chart, labs and discussed the procedure including the risks, benefits and alternatives for the proposed anesthesia with the patient or authorized representative who has indicated his/her understanding and acceptance.       Plan Discussed with: CRNA  Anesthesia Plan Comments:         Anesthesia Quick Evaluation

## 2018-11-14 NOTE — Transfer of Care (Signed)
Immediate Anesthesia Transfer of Care Note  Patient: Catherine Berg  Procedure(s) Performed: COLONOSCOPY WITH PROPOFOL (N/A ) ESOPHAGOGASTRODUODENOSCOPY (EGD) WITH PROPOFOL (N/A )  Patient Location: PACU  Anesthesia Type: General  Level of Consciousness: awake, alert  and patient cooperative  Airway and Oxygen Therapy: Patient Spontanous Breathing and Patient connected to supplemental oxygen  Post-op Assessment: Post-op Vital signs reviewed, Patient's Cardiovascular Status Stable, Respiratory Function Stable, Patent Airway and No signs of Nausea or vomiting  Post-op Vital Signs: Reviewed and stable  Complications: No apparent anesthesia complications

## 2018-11-14 NOTE — Anesthesia Postprocedure Evaluation (Signed)
Anesthesia Post Note  Patient: [name]  Procedure(s) Performed: COLONOSCOPY WITH PROPOFOL (N/A ) ESOPHAGOGASTRODUODENOSCOPY (EGD) WITH PROPOFOL (N/A )  Patient location during evaluation: PACU Anesthesia Type: General Level of consciousness: awake and alert and oriented Pain management: satisfactory to patient Vital Signs Assessment: post-procedure vital signs reviewed and stable Respiratory status: spontaneous breathing, nonlabored ventilation and respiratory function stable Cardiovascular status: blood pressure returned to baseline and stable Postop Assessment: Adequate PO intake and No signs of nausea or vomiting Anesthetic complications: no    Raliegh Ip

## 2018-11-15 ENCOUNTER — Encounter: Payer: Self-pay | Admitting: Gastroenterology

## 2018-11-16 ENCOUNTER — Telehealth: Payer: Self-pay | Admitting: Gastroenterology

## 2018-11-16 LAB — POCT PREGNANCY, URINE: Preg Test, Ur: NEGATIVE

## 2018-11-16 NOTE — Telephone Encounter (Signed)
Patient called & had procedure by Dr T 11-16-18. She needs a note stating her covid test was negative.

## 2018-11-16 NOTE — Telephone Encounter (Signed)
Pt has been instructed on how to retrieve covid 19 results on MyChart, pt verbalized understanding

## 2018-11-16 NOTE — Telephone Encounter (Signed)
Pt is calling back she states she can not access the results on Mychart she would like a letter received through [employer] with our letter header that states her Results were negative so she can go back to work.

## 2018-11-17 NOTE — Telephone Encounter (Signed)
Letter has been sent to pt, pt confirmed receipt of letter

## 2018-11-25 ENCOUNTER — Encounter: Payer: Self-pay | Admitting: Internal Medicine

## 2018-11-25 ENCOUNTER — Ambulatory Visit (INDEPENDENT_AMBULATORY_CARE_PROVIDER_SITE_OTHER): Payer: Managed Care, Other (non HMO) | Admitting: Internal Medicine

## 2018-11-25 ENCOUNTER — Other Ambulatory Visit: Payer: Self-pay

## 2018-11-25 ENCOUNTER — Telehealth: Payer: Self-pay | Admitting: *Deleted

## 2018-11-25 ENCOUNTER — Ambulatory Visit (INDEPENDENT_AMBULATORY_CARE_PROVIDER_SITE_OTHER): Payer: Managed Care, Other (non HMO)

## 2018-11-25 VITALS — BP 112/70 | HR 72 | Ht 63.0 in | Wt 235.8 lb

## 2018-11-25 DIAGNOSIS — R002 Palpitations: Secondary | ICD-10-CM

## 2018-11-25 DIAGNOSIS — R0602 Shortness of breath: Secondary | ICD-10-CM

## 2018-11-25 NOTE — Telephone Encounter (Signed)
Patient is down for a virtual visit this morning. Called patient to see if she would like to come in the office instead. She agreed.       COVID-19 Pre-Screening Questions:  . In the past 7 to 10 days have you had a cough,  shortness of breath, headache, congestion, fever (100 or greater) body aches, chills, sore throat, or sudden loss of taste or sense of smell?  NO . Have you been around anyone with known Covid 19? NO . Have you been around anyone who is awaiting Covid 19 test results in the past 7 to 10 days? NO . Have you been around anyone who has been exposed to Covid 19, or has mentioned symptoms of Covid 19 within the past 7 to 10 days?  NO

## 2018-11-25 NOTE — Progress Notes (Signed)
Follow-up Outpatient Visit Date: 11/25/2018  Primary Care Provider: Arnetha Courser, MD [address] [address]  Chief Complaint: Follow-up shortness of breath and palpitations  HPI:  [name] is a 38 y.o. year-old female with history of HTN, Graves' disease, impaired glucose tolerance, and anxiety, who presents for follow-up of shortness of breath and palpitations.  We met during a virtual visit in May, at which time she reported shortness of breath, particularly when talking, as well as frequent palpitations.  Due to possible coronary artery calcification, pharmacologic myocardial perfusion stress test and 14-day event monitor were ordered for further evaluation.  Stress test was normal.  Event monitor was never completed.  Today, Ms. [name] reports that she feels about the same as at our last visit.  She still has shortness of breath, at times finding it hard to get out of complete sentences.  She also had exertional dyspnea walking from her car today.  She denies chest pain.  Palpitations are still the same.  She does not have any associated symptoms such as lightheadedness.  She never received a event monitor after our prior visit.  She notes that her levothyroxine was increased about a week ago by her endocrinologist.  She continues to struggle with weight gain and fatigue but is hopeful that escalation of levothyroxine may help with this.  --------------------------------------------------------------------------------------------------  Cardiovascular History & Procedures: Cardiovascular Problems:  Palpitations  Shortness of breath  Risk Factors:  Hypertension and obesity  Cath/PCI:  None  CV Surgery:  None  EP Procedures and Devices:  None  Non-Invasive Evaluation(s):  Pharmacologic MPI (10/12/2018): Normal pharmacologic myocardial perfusion stress test without significant ischemia or scar.  LVEF 55%.  No significant coronary artery  calcification.  TTE (10/03/2015): Normal LV size.  LVEF 55-65%.  Normal RV size and function.  No significant valvular abnormality.  Recent CV Pertinent Labs: Lab Results  Component Value Date   CHOL 107 08/03/2017   HDL 50 (L) 08/03/2017   LDLCALC 44 08/03/2017   TRIG 54 08/03/2017   CHOLHDL 2.1 08/03/2017   BNP 17.0 03/31/2017   K 3.5 05/31/2018   K 3.9 08/13/2012   BUN 16 05/31/2018   BUN 9 07/21/2016   BUN 10 08/13/2012   CREATININE 0.86 05/31/2018   CREATININE 0.80 03/15/2018     Recent CV Pertinent Labs: Lab Results  Component Value Date   CHOL 107 08/03/2017   HDL 50 (L) 08/03/2017   LDLCALC 44 08/03/2017   TRIG 54 08/03/2017   CHOLHDL 2.1 08/03/2017   BNP 17.0 03/31/2017   K 3.5 05/31/2018   K 3.9 08/13/2012   BUN 16 05/31/2018   BUN 9 07/21/2016   BUN 10 08/13/2012   CREATININE 0.86 05/31/2018   CREATININE 0.80 03/15/2018    Past medical and surgical history were reviewed and updated in EPIC.  Current Meds  Medication Sig   albuterol (PROVENTIL HFA;VENTOLIN HFA) 108 (90 Base) MCG/ACT inhaler Inhale 2 puffs into the lungs every 6 (six) hours as needed for wheezing or shortness of breath.   budesonide-formoterol (SYMBICORT) 80-4.5 MCG/ACT inhaler Inhale 2 puffs into the lungs 2 (two) times daily. This replaces Breo   Cyanocobalamin (B-12) 1000 MCG SUBL Place 1 tablet under the tongue daily.   Elastic Bandages & Supports (MEDICAL COMPRESSION STOCKINGS) MISC Wear from morning to night, but do not sleep in these   Etonogestrel (NEXPLANON [place]) Inject into the skin.   fluticasone (FLONASE) 50 MCG/ACT nasal spray Place 2  sprays into both nostrils daily.   hydrOXYzine (ATARAX/VISTARIL) 25 MG tablet Take 1 tablet (25 mg total) by mouth 3 (three) times daily as needed.   levothyroxine (SYNTHROID) 50 MCG tablet Take 50 mcg by mouth daily.    montelukast (SINGULAIR) 10 MG tablet Take 1 tablet (10 mg total) by mouth at bedtime.   pregabalin (LYRICA) 75 MG  capsule Take 1 capsule (75 mg total) by mouth 2 (two) times daily.   Vitamin D, Ergocalciferol, (DRISDOL) 1.25 MG (50000 UT) CAPS capsule Take 1 capsule (50,000 Units total) by mouth every 7 (seven) days.    Allergies: Latex and Shellfish allergy  Social History   Tobacco Use   Smoking status: Never Smoker   Smokeless tobacco: Never Used  Substance Use Topics   Alcohol use: No    Alcohol/week: 0.0 standard drinks   Drug use: No    Family History  Problem Relation Age of Onset   Heart attack Mother 47   Hypertension Mother    Asthma Mother    Cancer Mother        laryngeal   Diabetes Mother        pre diabetic   Heart disease Mother    Mental illness Mother    Alcohol abuse Mother    Drug abuse Mother    Depression Mother    Anxiety disorder Mother    Bipolar disorder Mother    Learning disabilities Brother    ADD / ADHD Brother    Asthma Son    Hyperlipidemia Brother    Alcohol abuse Father    Heart disease Maternal Grandmother        heart attack, pacemaker   Heart attack Maternal Grandmother    Hypertension Maternal Grandmother    Hypertension Maternal Grandfather    Heart disease Paternal Grandmother        couple of major open heart surgeries, leaking valves   Hypertension Paternal Grandmother    Hypertension Paternal Grandfather     Review of Systems: A 12-system review of systems was performed and was negative except as noted in the HPI.  --------------------------------------------------------------------------------------------------  Physical Exam: BP 112/70 (BP Location: Left Arm, Patient Position: Sitting, Cuff Size: Large)    Pulse 72    Ht _0  (1.6 m)    Wt 235 lb 12 oz (106.9 kg)    BMI 41.76 kg/m   General: NAD. HEENT: No conjunctival pallor or scleral icterus.  Facemask in place. Neck: Supple without lymphadenopathy, thyromegaly, JVD, or HJR. No carotid bruit. Lungs: Normal work of breathing. Clear to  auscultation bilaterally without wheezes or crackles. Heart: Regular rate and rhythm without murmurs, rubs, or gallops.  Unable to assess PMI due to body habitus. Abd: Bowel sounds present. Soft, NT/ND.  Unable to assess HSM due to body habitus. Ext: No lower extremity edema. Radial, PT, and DP pulses are 2+ bilaterally. Skin: Warm and dry without rash.  EKG: Normal sinus rhythm without abnormality.  Lab Results  Component Value Date   WBC 7.2 09/14/2018   HGB 12.7 09/14/2018   HCT 37.3 09/14/2018   MCV 85.7 09/14/2018   PLT 327 09/14/2018    Lab Results  Component Value Date   NA 137 05/31/2018   K 3.5 05/31/2018   CL 107 05/31/2018   CO2 24 05/31/2018   BUN 16 05/31/2018   CREATININE 0.86 05/31/2018   GLUCOSE 93 05/31/2018   ALT 20 05/31/2018    Lab Results  Component Value Date   CHOL  107 08/03/2017   HDL 50 (L) 08/03/2017   LDLCALC 44 08/03/2017   TRIG 54 08/03/2017   CHOLHDL 2.1 08/03/2017    --------------------------------------------------------------------------------------------------  ASSESSMENT AND PLAN: Shortness of breath: Unchanged since prior evaluation.  Exam and EKG today are unrevealing.  Myocardial perfusion stress test since our last visit showed no evidence of ischemia or scar.  LVEF was normal.  Echocardiogram in 2017 was also unremarkable.  I suspect obesity and deconditioning of the driving factors here.  I do not recommend any additional cardiac work-up for this.  Palpitations: Sporadic and unchanged.  Event monitor was never received after our last visit.  Will place a 14-day ZIO patch today for further evaluation.  No medication changes at this time.  Morbid obesity: Encouraged the patient to lose weight through combination of diet and exercise.  Follow-up: Return to clinic in 6 months.  Nelva Bush, MD 11/25/2018 9:24 AM

## 2018-11-25 NOTE — Patient Instructions (Signed)
Medication Instructions:  Your physician recommends that you continue on your current medications as directed. Please refer to the Current Medication list given to you today.  If you need a refill on your cardiac medications before your next appointment, please call your pharmacy.   Lab work: - None ordered.  If you have labs (blood work) drawn today and your tests are completely normal, you will receive your results only by: Marland Kitchen MyChart Message (if you have MyChart) OR . A paper copy in the mail If you have any lab test that is abnormal or we need to change your treatment, we will call you to review the results.  Testing/Procedures: Your physician has recommended that you wear an 14 DAY ZIO event monitor. Event monitors are medical devices that record the heart's electrical activity. Doctors most often [place] these monitors to diagnose arrhythmias. Arrhythmias are problems with the speed or rhythm of the heartbeat. The monitor is a small, portable device. You can wear one while you do your normal daily activities. This is usually used to diagnose what is causing palpitations/syncope (passing out).  A Zio Patch Event Heart monitor will be applied to your chest today.  You will wear the patch for 14 days. After 24 hours, you may shower with the heart monitor on. If you feel any palpitations, you may press and release the button in the middle of the monitor.    Follow-Up: At [facility], you and your health needs are our priority.  As part of our continuing mission to provide you with exceptional heart care, we have created designated Provider Care Teams.  These Care Teams include your primary Cardiologist (physician) and Advanced Practice Providers (APPs -  Physician Assistants and Nurse Practitioners) who all work together to provide you with the care you need, when you need it. You will need a follow up appointment in 6 months.  Please call our office 2 months in advance to schedule this  appointment.  You may see Nelva Bush, MD or one of the following Advanced Practice Providers on your designated Care Team:   Murray Hodgkins, NP Christell Faith, PA-C . Marrianne Mood, PA-C

## 2018-11-26 ENCOUNTER — Encounter: Payer: Self-pay | Admitting: Internal Medicine

## 2018-11-30 ENCOUNTER — Telehealth: Payer: Self-pay | Admitting: Gastroenterology

## 2018-11-30 NOTE — Telephone Encounter (Signed)
Pt is calling for results on procedure

## 2018-12-05 NOTE — Telephone Encounter (Signed)
pATIENT CALLED TODAY ASKING FOR HER RESULTS FROM HER PROCEDURE ON 11-14-18 @ [facility].

## 2018-12-12 ENCOUNTER — Other Ambulatory Visit: Payer: Self-pay

## 2018-12-12 ENCOUNTER — Telehealth: Payer: Self-pay

## 2018-12-12 DIAGNOSIS — D509 Iron deficiency anemia, unspecified: Secondary | ICD-10-CM

## 2018-12-12 NOTE — Telephone Encounter (Signed)
-----   Message from Virgel Manifold, MD sent at 12/11/2018  7:16 PM EDT ----- Pt states she has not received results. Please call her with results as below. Thank you

## 2018-12-12 NOTE — Telephone Encounter (Signed)
Pt notified of normal biopsy results and of need for small bowel caps study and 3 month f/u. Will contact pt date of small bowel caps. Study. Left message that Small bowel cap study is scheduled for 12/27/2018 at [facility]. To contact Endo the day before at [phone]. Will also send via My Chart.

## 2018-12-27 ENCOUNTER — Encounter: Payer: Self-pay | Admitting: Anesthesiology

## 2018-12-27 ENCOUNTER — Encounter
Admission: RE | Disposition: A | Discharge status: Home / Self Care | Payer: Self-pay | Source: Home / Self Care | Attending: Gastroenterology

## 2018-12-27 ENCOUNTER — Ambulatory Visit
Admission: RE | Admit: 2018-12-27 | Discharge: 2018-12-27 | Disposition: A | Discharge status: Home / Self Care | Payer: Managed Care, Other (non HMO) | Attending: Gastroenterology | Admitting: Gastroenterology

## 2018-12-27 DIAGNOSIS — D509 Iron deficiency anemia, unspecified: Secondary | ICD-10-CM | POA: Insufficient documentation

## 2018-12-27 HISTORY — PX: GIVENS CAPSULE STUDY: SHX5432

## 2018-12-27 SURGERY — IMAGING PROCEDURE, GI TRACT, INTRALUMINAL, VIA CAPSULE

## 2018-12-28 ENCOUNTER — Encounter: Payer: Self-pay | Admitting: Gastroenterology

## 2019-01-05 ENCOUNTER — Ambulatory Visit (INDEPENDENT_AMBULATORY_CARE_PROVIDER_SITE_OTHER): Payer: Managed Care, Other (non HMO) | Admitting: Family Medicine

## 2019-01-05 ENCOUNTER — Encounter: Payer: Self-pay | Admitting: Family Medicine

## 2019-01-05 ENCOUNTER — Other Ambulatory Visit: Payer: Self-pay

## 2019-01-05 VITALS — BP 130/80 | HR 70 | Temp 97.1°F | Resp 16 | Ht 63.0 in | Wt 237.8 lb

## 2019-01-05 DIAGNOSIS — E538 Deficiency of other specified B group vitamins: Secondary | ICD-10-CM

## 2019-01-05 DIAGNOSIS — J302 Other seasonal allergic rhinitis: Secondary | ICD-10-CM

## 2019-01-05 DIAGNOSIS — G5793 Unspecified mononeuropathy of bilateral lower limbs: Secondary | ICD-10-CM | POA: Diagnosis not present

## 2019-01-05 DIAGNOSIS — Z1159 Encounter for screening for other viral diseases: Secondary | ICD-10-CM

## 2019-01-05 DIAGNOSIS — E559 Vitamin D deficiency, unspecified: Secondary | ICD-10-CM

## 2019-01-05 DIAGNOSIS — L509 Urticaria, unspecified: Secondary | ICD-10-CM

## 2019-01-05 DIAGNOSIS — D508 Other iron deficiency anemias: Secondary | ICD-10-CM

## 2019-01-05 DIAGNOSIS — J4520 Mild intermittent asthma, uncomplicated: Secondary | ICD-10-CM

## 2019-01-05 DIAGNOSIS — Z79899 Other long term (current) drug therapy: Secondary | ICD-10-CM

## 2019-01-05 DIAGNOSIS — Z23 Encounter for immunization: Secondary | ICD-10-CM | POA: Diagnosis not present

## 2019-01-05 DIAGNOSIS — E890 Postprocedural hypothyroidism: Secondary | ICD-10-CM

## 2019-01-05 DIAGNOSIS — R7982 Elevated C-reactive protein (CRP): Secondary | ICD-10-CM

## 2019-01-05 DIAGNOSIS — Z13220 Encounter for screening for lipoid disorders: Secondary | ICD-10-CM

## 2019-01-05 DIAGNOSIS — G4709 Other insomnia: Secondary | ICD-10-CM

## 2019-01-05 DIAGNOSIS — Z131 Encounter for screening for diabetes mellitus: Secondary | ICD-10-CM

## 2019-01-05 DIAGNOSIS — J3089 Other allergic rhinitis: Secondary | ICD-10-CM

## 2019-01-05 MED ORDER — HYDROXYZINE HCL 25 MG PO TABS: 25.0000 mg | ORAL_TABLET | Freq: Three times a day (TID) | ORAL | 1 refills | Status: DC | PRN

## 2019-01-05 MED ORDER — MONTELUKAST SODIUM 10 MG PO TABS: 10.0000 mg | ORAL_TABLET | Freq: Every day | ORAL | 1 refills | Status: DC

## 2019-01-05 MED ORDER — PREGABALIN 75 MG PO CAPS: 75.0000 mg | ORAL_CAPSULE | Freq: Two times a day (BID) | ORAL | 1 refills | Status: DC

## 2019-01-05 MED ORDER — FLUTICASONE PROPIONATE 50 MCG/ACT NA SUSP: 2.0000 | Freq: Every day | NASAL | 2 refills | Status: DC

## 2019-01-05 NOTE — Progress Notes (Signed)
Name: Catherine Berg   MRN: [id number]    DOB: 1980-05-18   Date:01/05/2019       Progress Note  Subjective  Chief Complaint  Chief Complaint  Patient presents with  . Anxiety  . Depression    HPI  Elevated CRP: she is here today to recheck labs, she has aching pains, neuropathy, and we will recheck labs today  Iron deficiency anemia: seeing Dr. Maximino Greenland and had colonoscopy and endoscopy and had a capsule endoscopy done but results pending. She has Nexplanon and does have cycles. She eats meat daily and does not seem to have a low iron diet. Last CBC was normal, iron studies improved, she is not currently taking iron pills.   Asthma Mild intermittent: she is under the care of Dr. Linward Natal and states no longer taking taking Symbicort, she states symptoms controlled with singulair, she uses rescue inhaler at most twice a week. No cough, wheezing but has SOB intermittent . Advised to try to rest next time and take some deep slow breaths to make sure not using rescue inhaler for anxiety  Vitamin D and B12 deficiency: taking supplementation, she has neuropathy and is taking Lyrica she states it seems to help with symptoms  History of depression: around the time her mother died in Sep 22, 2014 , she was on medications for a period of times, she still has some anxiety and takes hydroxizine prn, but states when she takes for sleep she wakes up groggy.   Insomnia: she falls asleep but wakes up in the middle of the night and cannot fall back asleep, she is willing to try trazodone  Morbid Obesity: she states gained weight at age 68 when she started depo and also ocp's afterwards, heaviest weight is now, she has tried calorie counting in the past and used to walk 4 miles daily when not working, however more sedentary, she also eats fast food, she recently stopped drinking regular sodas. Discussed keto diet and also intermittent fasting for her to consider, needs to pack her lunch and prep meals.   Patient  Active Problem List   Diagnosis Date Noted  . Iron deficiency anemia   . Palpitations 09/16/2018  . Coronary artery calcification 09/16/2018  . 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 Bilateral leg edema 04/20/2017  . Incidental lung nodule, > 22mm and < 3mm 03/31/2017  . Hives 09/02/2015  . Morbid obesity (HCC) 08/15/2015  . Anxiety 07/09/2015  . 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 Thyroid nodule 02/01/2015  . Vitamin D deficiency disease   . Vitamin B12 deficiency   . History of abnormal cervical Pap smear   . Allergy-induced asthma, mild intermittent, uncomplicated 10/09/2013    Past Surgical History:  Procedure Laterality Date  . CERVICAL POLYPECTOMY    . CESAREAN SECTION     x 2  . COLONOSCOPY WITH PROPOFOL N/A 11/14/2018   Procedure: COLONOSCOPY WITH PROPOFOL;  Surgeon: Pasty Spillers, MD;  Location: [facility];  Service: Endoscopy;  Laterality: N/A;  . ESOPHAGOGASTRODUODENOSCOPY (EGD) WITH PROPOFOL N/A 11/14/2018   Procedure: ESOPHAGOGASTRODUODENOSCOPY (EGD) WITH PROPOFOL;  Surgeon: Pasty Spillers, MD;  Location[facility];  Service: Endoscopy;  Laterality: N/A;  Latex allergy  . GIVENS CAPSULE STUDY N/A 12/27/2018   Procedure: GIVENS CAPSULE STUDY;  Surgeon: Pasty Spillers, MD;  Location: [facility];  Service: Endoscopy;  Laterality: N/A;    Family History  Problem Relation Age of Onset  . Heart attack Mother 20  . Hypertension Mother   .  Asthma Mother   . Cancer Mother        laryngeal  . Diabetes Mother        pre diabetic  . Heart disease Mother   . Mental illness Mother   . Alcohol abuse Mother   . Drug abuse Mother   . Depression Mother   . Anxiety disorder Mother   . Bipolar disorder Mother   . Learning disabilities Brother   . ADD / ADHD Brother   . Asthma Son   . Hyperlipidemia Brother   . Alcohol abuse Father   . Heart disease Maternal Grandmother        heart attack, pacemaker  . Heart attack  Maternal Grandmother   . Hypertension Maternal Grandmother   . Hypertension Maternal Grandfather   . Heart disease Paternal Grandmother        couple of major open heart surgeries, leaking valves  . Hypertension Paternal Grandmother   . Hypertension Paternal Grandfather     Social History   Socioeconomic History  . Marital status: Single    Spouse name: Not on file  . Number of children: 2  . Years of education: Not on file  . Highest education level: High school graduate  Occupational History  . Not on file  Social Needs  . Financial resource strain: Not hard at all  . Food insecurity    Worry: Never true    Inability: Never true  . Transportation needs    Medical: No    Non-medical: No  Tobacco Use  . Smoking status: Never Smoker  . Smokeless tobacco: Never Used  Substance and Sexual Activity  . Alcohol use: No    Alcohol/week: 0.0 standard drinks  . Drug use: No  . Sexual activity: Yes    Partners: Male    Birth control/protection: None  Lifestyle  . Physical activity    Days per week: 5 days    Minutes per session: 150+ min  . Stress: Rather much  Relationships  . Social connections    Talks on phone: More than three times a week    Gets together: More than three times a week    Attends religious service: More than 4 times per year    Active member of club or organization: Yes    Attends meetings of clubs or organizations: More than 4 times per year    Relationship status: Never married  . Intimate partner violence    Fear of current or ex partner: No    Emotionally abused: No    Physically abused: No    Forced sexual activity: No  Other Topics Concern  . Not on file  Social History Narrative  . Not on file     Current Outpatient Medications:  .  albuterol (PROVENTIL HFA;VENTOLIN HFA) 108 (90 Base) MCG/ACT inhaler, Inhale 2 puffs into the lungs every 6 (six) hours as needed for wheezing or shortness of breath., Disp: 18 g, Rfl: 1 .  Cyanocobalamin  (B-12) 1000 MCG SUBL, Place 1 tablet under the tongue daily., Disp: 30 each, Rfl: 5 .  Elastic Bandages & Supports (MEDICAL COMPRESSION STOCKINGS) MISC, Wear from morning to night, but do not sleep in these, Disp: 1 each, Rfl: 2 .  Etonogestrel (NEXPLANON [place]), Inject into the skin., Disp: , Rfl:  .  fluticasone (FLONASE) 50 MCG/ACT nasal spray, Place 2 sprays into both nostrils daily., Disp: 16 g, Rfl: 2 .  hydrOXYzine (ATARAX/VISTARIL) 25 MG tablet, Take 1 tablet (25 mg total)  by mouth 3 (three) times daily as needed., Disp: 30 tablet, Rfl: 1 .  levothyroxine (SYNTHROID) 50 MCG tablet, Take 50 mcg by mouth daily., Disp: , Rfl:  .  montelukast (SINGULAIR) 10 MG tablet, Take 1 tablet (10 mg total) by mouth at bedtime., Disp: 90 tablet, Rfl: 1 .  pregabalin (LYRICA) 75 MG capsule, Take 1 capsule (75 mg total) by mouth 2 (two) times daily., Disp: 180 capsule, Rfl: 1 .  Vitamin D, Ergocalciferol, (DRISDOL) 1.25 MG (50000 UT) CAPS capsule, Take 1 capsule (50,000 Units total) by mouth every 7 (seven) days., Disp: 12 capsule, Rfl: 1  Allergies  Allergen Reactions  . Latex Hives    (Balloons, condoms, underwear elastic, gloves)  . Shellfish Allergy Hives    I personally reviewed active problem list, medication list, allergies, family history, social history, health maintenance with the patient/caregiver today.   ROS  Constitutional: Negative for fever or weight change.  Respiratory: Negative for cough , positive for intermittent  shortness of breath.   Cardiovascular: Negative for chest pain or palpitations.  Gastrointestinal: Negative for abdominal pain, no bowel changes.  Musculoskeletal: Negative for gait problem or joint swelling.  Skin: Negative for rash.  Neurological: Negative for dizziness or headache.  No other specific complaints in a complete review of systems (except as listed in HPI above).  Objective  Vitals:   01/05/19 0846  BP: 130/80  Pulse: 70  Resp: 16  Temp: (!) 97.1  F (36.2 C)  TempSrc: Temporal  SpO2: 98%  Weight: 237 lb 12.8 oz (107.9 kg)  Height: 5\' 3"  (1.6 m)    Body mass index is 42.12 kg/m.  Physical Exam  Constitutional: Patient appears well-developed and well-nourished. Obese  No distress.  HEENT: head atraumatic, normocephalic, pupils equal and reactive to light,  neck supple Cardiovascular: Normal rate, regular rhythm and normal heart sounds.  No murmur heard. Trace BLE edema. Pulmonary/Chest: Effort normal and breath sounds normal. No respiratory distress. Abdominal: Soft.  There is no tenderness. Psychiatric: Patient has a normal mood and affect. behavior is normal. Judgment and thought content normal.  Recent Results (from the past 2160 hour(s))  NM Myocar Multi W/Spect W/Wall Motion / EF     Status: None   Collection Time: 10/12/18 10:11 AM  Result Value Ref Range   Rest HR 75 bpm   Rest BP 153/77 mmHg   Exercise duration (sec) 0 sec   Percent HR 79 %   Exercise duration (min) 0 min   Estimated workload 1.0 METS   Peak HR 144 bpm   Peak BP 140/75 mmHg   MPHR 182 bpm   TID 0.98    LV sys vol 56 mL   LV dias vol 125 46 - 106 mL  SARS Coronavirus 2 (Performed in [place] hospital lab)     Status: None   Collection Time: 11/10/18 10:38 AM   Specimen: Nasal Swab  Result Value Ref Range   SARS Coronavirus 2 NEGATIVE NEGATIVE    Comment: (NOTE) SARS-CoV-2 target nucleic acids are NOT DETECTED. The SARS-CoV-2 RNA is generally detectable in upper and lower respiratory specimens during the acute phase of infection. Negative results do not preclude SARS-CoV-2 infection, do not rule out co-infections with other pathogens, and should not be used as the sole basis for treatment or other patient management decisions. Negative results must be combined with clinical observations, patient history, and epidemiological information. The expected result is Negative. Fact Sheet for  Patients: SugarRoll.be Fact Sheet for  Healthcare Providers: quierodirigir.comhttps://www.fda.gov/media/138095/download This test is not yet approved or cleared by the [place] FDA and  has been authorized for detection and/or diagnosis of SARS-CoV-2 by FDA under an Emergency Use Authorization (EUA). This EUA will remain  in effect (meaning this test can be used) for the duration of the COVID-19 declaration under Section 56 4(b)(1) of the Act, 21 U.S.C. section 360bbb-3(b)(1), unless the authorization is terminated or revoked sooner. Performed at [facility], [address] [place], [place] [address]   Pregnancy, urine POC     Status: None   Collection Time: 11/14/18 11:19 AM  Result Value Ref Range   Preg Test, Ur NEGATIVE NEGATIVE    Comment:        THE SENSITIVITY OF THIS METHODOLOGY IS >24 mIU/mL       PHQ2/9: [facility] screen [facility] 01/05/2019 10/05/2018 09/14/2018 06/02/2018 03/15/2018  Decreased Interest 0 0 0 0 0  Down, Depressed, Hopeless 0 0 0 2 0  PHQ - 2 Score 0 0 0 2 0  Altered sleeping 1 1 3 2  0  Tired, decreased energy 0 1 3 1  0  Change in appetite 0 0 0 0 0  Feeling bad or failure about yourself  0 0 0 0 0  Trouble concentrating 0 0 1 2 0  Moving slowly or fidgety/restless 0 0 0 0 0  Suicidal thoughts 0 0 0 0 0  PHQ-9 Score 1 2 7 7  0  Difficult doing work/chores Not difficult at all Not difficult at all Somewhat difficult Somewhat difficult Not difficult at all  Some recent data might be hidden    phq 9 is negative  Fall Risk: Fall Risk  01/05/2019 10/05/2018 06/02/2018 03/15/2018 08/03/2017  Falls in the past year? 0 0 0 1 Yes  Number falls in past yr: 0 0 0 1 1  Injury with Fall? 0 0 0 0 Yes  Comment - - - - brusise     Assessment & Plan   1. Neuropathy involving both lower extremities  - pregabalin (LYRICA) 75 MG capsule; Take 1 capsule (75 mg total) by mouth 2 (two) times daily.  Dispense: 180 capsule; Refill: 1  2. Other  insomnia  - pregabalin (LYRICA) 75 MG capsule; Take 1 capsule (75 mg total) by mouth 2 (two) times daily.  Dispense: 180 capsule; Refill: 1  3. Hives  - hydrOXYzine (ATARAX/VISTARIL) 25 MG tablet; Take 1 tablet (25 mg total) by mouth 3 (three) times daily as needed.  Dispense: 30 tablet; Refill: 1  4. Vitamin B12 deficiency  - Vitamin B12  5. Morbid obesity (HCC)  Discussed with the patient the risk posed by an increased BMI. Discussed importance of portion control, calorie counting and at least 150 minutes of physical activity weekly. Avoid sweet beverages and drink more water. Eat at least 6 servings of fruit and vegetables daily   6. Vitamin D deficiency disease  - VITAMIN D 25 Hydroxy (Vit-D Deficiency, Fractures)  7. Need for immunization against influenza  - Flu Vaccine QUAD 36+ mos IM  8. Postablative hypothyroidism  Under the care of Dr. Gershon Crane'Connell   9. Allergy-induced asthma, mild intermittent, uncomplicated  - montelukast (SINGULAIR) 10 MG tablet; Take 1 tablet (10 mg total) by mouth at bedtime.  Dispense: 90 tablet; Refill: 1  10. Perennial allergic rhinitis with seasonal variation  - fluticasone (FLONASE) 50 MCG/ACT nasal spray; Place 2 sprays into both nostrils daily.  Dispense: 16 g; Refill: 2  11. Elevated C-reactive  protein (CRP)  - C-reactive protein - Sedimentation rate - COMPLETE METABOLIC PANEL WITH GFR  12. Diabetes mellitus screening  - Hemoglobin A1c  13. Lipid screening  - Lipid panel  14. Need for hepatitis C screening test  - Hepatitis C antibody  15. Other iron deficiency anemia  - CBC with Differential/Platelet - Iron, TIBC and Ferritin Panel  16. Long-term use of high-risk medication  - COMPLETE METABOLIC PANEL WITH GFR

## 2019-01-06 LAB — CBC WITH DIFFERENTIAL/PLATELET
Absolute Monocytes: 380 cells/uL (ref 200–950)
Basophils Absolute: 22 cells/uL (ref 0–200)
Basophils Relative: 0.4 %
Eosinophils Absolute: 121 cells/uL (ref 15–500)
Eosinophils Relative: 2.2 %
HCT: 36.7 % (ref 35.0–45.0)
Hemoglobin: 12.4 g/dL (ref 11.7–15.5)
Lymphs Abs: 2371 cells/uL (ref 850–3900)
MCH: 28.6 pg (ref 27.0–33.0)
MCHC: 33.8 g/dL (ref 32.0–36.0)
MCV: 84.8 fL (ref 80.0–100.0)
MPV: 9.8 fL (ref 7.5–12.5)
Monocytes Relative: 6.9 %
Neutro Abs: 2607 cells/uL (ref 1500–7800)
Neutrophils Relative %: 47.4 %
Platelets: 327 10*3/uL (ref 140–400)
RBC: 4.33 10*6/uL (ref 3.80–5.10)
RDW: 13 % (ref 11.0–15.0)
Total Lymphocyte: 43.1 %
WBC: 5.5 10*3/uL (ref 3.8–10.8)

## 2019-01-06 LAB — COMPLETE METABOLIC PANEL WITH GFR
AG Ratio: 1.2 (calc) (ref 1.0–2.5)
ALT: 17 U/L (ref 6–29)
AST: 19 U/L (ref 10–30)
Albumin: 3.9 g/dL (ref 3.6–5.1)
Alkaline phosphatase (APISO): 63 U/L (ref 31–125)
BUN: 11 mg/dL (ref 7–25)
CO2: 28 mmol/L (ref 20–32)
Calcium: 9.5 mg/dL (ref 8.6–10.2)
Chloride: 103 mmol/L (ref 98–110)
Creat: 0.85 mg/dL (ref 0.50–1.10)
GFR, Est African American: 100 mL/min/{1.73_m2} (ref >=60)
GFR, Est Non African American: 86 mL/min/{1.73_m2} (ref >=60)
Globulin: 3.3 g/dL (calc) (ref 1.9–3.7)
Glucose, Bld: 88 mg/dL (ref 65–99)
Potassium: 4.6 mmol/L (ref 3.5–5.3)
Sodium: 136 mmol/L (ref 135–146)
Total Bilirubin: 0.4 mg/dL (ref 0.2–1.2)
Total Protein: 7.2 g/dL (ref 6.1–8.1)

## 2019-01-06 LAB — C-REACTIVE PROTEIN: CRP: 14.3 mg/L — ABNORMAL HIGH (ref <8.0)

## 2019-01-06 LAB — IRON,TIBC AND FERRITIN PANEL
%SAT: 16 % (calc) (ref 16–45)
Ferritin: 55 ng/mL (ref 16–154)
Iron: 49 ug/dL (ref 40–190)
TIBC: 306 mcg/dL (calc) (ref 250–450)

## 2019-01-06 LAB — HEMOGLOBIN A1C
Hgb A1c MFr Bld: 5.3 % of total Hgb (ref <5.7)
Mean Plasma Glucose: 105 (calc)
eAG (mmol/L): 5.8 (calc)

## 2019-01-06 LAB — LIPID PANEL
Cholesterol: 110 mg/dL (ref <200)
HDL: 59 mg/dL (ref >=50)
LDL Cholesterol (Calc): 38 mg/dL (calc)
Non-HDL Cholesterol (Calc): 51 mg/dL (calc) (ref <130)
Total CHOL/HDL Ratio: 1.9 (calc) (ref <5.0)
Triglycerides: 47 mg/dL (ref <150)

## 2019-01-06 LAB — VITAMIN B12: Vitamin B-12: 363 pg/mL (ref 200–1100)

## 2019-01-06 LAB — HEPATITIS C ANTIBODY
Hepatitis C Ab: NONREACTIVE
SIGNAL TO CUT-OFF: 0.03 (ref <1.00)

## 2019-01-06 LAB — VITAMIN D 25 HYDROXY (VIT D DEFICIENCY, FRACTURES): Vit D, 25-Hydroxy: 26 ng/mL — ABNORMAL LOW (ref 30–100)

## 2019-01-06 LAB — SEDIMENTATION RATE: Sed Rate: 28 mm/h — ABNORMAL HIGH (ref 0–20)

## 2019-01-10 ENCOUNTER — Other Ambulatory Visit: Payer: Self-pay | Admitting: Family Medicine

## 2019-01-10 DIAGNOSIS — Z8261 Family history of arthritis: Secondary | ICD-10-CM

## 2019-01-10 DIAGNOSIS — R7982 Elevated C-reactive protein (CRP): Secondary | ICD-10-CM

## 2019-01-10 DIAGNOSIS — M2550 Pain in unspecified joint: Secondary | ICD-10-CM

## 2019-01-11 ENCOUNTER — Encounter: Payer: Self-pay | Admitting: Family Medicine

## 2019-01-13 ENCOUNTER — Ambulatory Visit
Admission: RE | Admit: 2019-01-13 | Discharge: 2019-01-13 | Disposition: A | Discharge status: Home / Self Care | Payer: Managed Care, Other (non HMO) | Attending: Gastroenterology | Admitting: Gastroenterology

## 2019-01-13 ENCOUNTER — Ambulatory Visit
Admission: RE | Admit: 2019-01-13 | Discharge: 2019-01-13 | Disposition: A | Discharge status: Home / Self Care | Payer: Managed Care, Other (non HMO) | Source: Ambulatory Visit | Attending: Gastroenterology | Admitting: Gastroenterology

## 2019-01-13 ENCOUNTER — Other Ambulatory Visit: Payer: Self-pay | Admitting: Gastroenterology

## 2019-01-13 ENCOUNTER — Other Ambulatory Visit: Payer: Self-pay

## 2019-01-13 DIAGNOSIS — D509 Iron deficiency anemia, unspecified: Secondary | ICD-10-CM

## 2019-01-13 NOTE — Progress Notes (Signed)
Official small bowel capsule results pending. I talked to pt and explained the capsule may not have reached the cecum as the area was entirely covered with stool at the end. It may represent the cecum, but an X-ray would help confirm It has passed. Pt is asymptomatic and will go get her X-ray done today or tomorrow.

## 2019-01-19 ENCOUNTER — Telehealth: Payer: Self-pay

## 2019-01-19 ENCOUNTER — Encounter: Payer: Self-pay | Admitting: Gastroenterology

## 2019-01-19 NOTE — Telephone Encounter (Signed)
Patient capsule study stated no specific red spots seen. No active bleeding or lesions to attribute to iron deficiency evident.  Called patient and patient verbalized understanding. Made 3 month follow up appointment

## 2019-01-24 DIAGNOSIS — R7982 Elevated C-reactive protein (CRP): Secondary | ICD-10-CM | POA: Insufficient documentation

## 2019-01-24 DIAGNOSIS — R700 Elevated erythrocyte sedimentation rate: Secondary | ICD-10-CM | POA: Insufficient documentation

## 2019-01-24 DIAGNOSIS — E66813 Obesity, class 3: Secondary | ICD-10-CM | POA: Insufficient documentation

## 2019-01-24 DIAGNOSIS — M25549 Pain in joints of unspecified hand: Secondary | ICD-10-CM | POA: Insufficient documentation

## 2019-02-10 ENCOUNTER — Ambulatory Visit: Payer: Managed Care, Other (non HMO) | Admitting: Internal Medicine

## 2019-03-06 ENCOUNTER — Encounter: Payer: Self-pay | Admitting: Family Medicine

## 2019-03-07 ENCOUNTER — Telehealth: Payer: Managed Care, Other (non HMO) | Admitting: Nurse Practitioner

## 2019-03-07 DIAGNOSIS — N3000 Acute cystitis without hematuria: Secondary | ICD-10-CM

## 2019-03-07 MED ORDER — FLUCONAZOLE 150 MG PO TABS: 150.0000 mg | ORAL_TABLET | Freq: Once | ORAL | 0 refills | Status: AC

## 2019-03-07 MED ORDER — CEPHALEXIN 500 MG PO CAPS: 500.0000 mg | ORAL_CAPSULE | Freq: Two times a day (BID) | ORAL | 0 refills | Status: DC

## 2019-03-07 NOTE — Progress Notes (Signed)
We are sorry that you are not feeling well.  Here is how we plan to help!  Based on what you shared with me it looks like you most likely have a simple urinary tract infection.  A UTI (Urinary Tract Infection) is a bacterial infection of the bladder.  Most cases of urinary tract infections are simple to treat but a key part of your care is to encourage you to drink plenty of fluids and watch your symptoms carefully.  I have prescribed Keflex 500 mg twice a day for 7 days.  Your symptoms should gradually improve. Call us if the burning in your urine worsens, you develop worsening fever, back pain or pelvic pain or if your symptoms do not resolve after completing the antibiotic. I have also sent in a diflucan in case you have yeast infection as well.  Urinary tract infections can be prevented by drinking plenty of water to keep your body hydrated.  Also be sure when you wipe, wipe from front to back and don't hold it in!  If possible, empty your bladder every 4 hours.  Your e-visit answers were reviewed by a board certified advanced clinical practitioner to complete your personal care plan.  Depending on the condition, your plan could have included both over the counter or prescription medications.  If there is a problem please reply  once you have received a response from your provider.  Your safety is important to [place].  If you have drug allergies check your prescription carefully.    You can use MyChart to ask questions about today's visit, request a non-urgent call back, or ask for a work or school excuse for 24 hours related to this e-Visit. If it has been greater than 24 hours you will need to follow up with your provider, or enter a new e-Visit to address those concerns.   You will get an e-mail in the next two days asking about your experience.  I hope that your e-visit has been valuable and will speed your recovery. Thank you for using e-visits.  5-10 minutes spent reviewing and  documenting in chart.

## 2019-03-27 ENCOUNTER — Encounter: Payer: Self-pay | Admitting: Family Medicine

## 2019-03-28 ENCOUNTER — Other Ambulatory Visit: Payer: Self-pay

## 2019-03-28 ENCOUNTER — Other Ambulatory Visit: Payer: Self-pay | Admitting: Family Medicine

## 2019-03-28 ENCOUNTER — Encounter: Payer: Self-pay | Admitting: Family Medicine

## 2019-03-28 ENCOUNTER — Telehealth: Payer: Self-pay | Admitting: Family Medicine

## 2019-03-28 ENCOUNTER — Ambulatory Visit (INDEPENDENT_AMBULATORY_CARE_PROVIDER_SITE_OTHER): Payer: Managed Care, Other (non HMO) | Admitting: Family Medicine

## 2019-03-28 ENCOUNTER — Other Ambulatory Visit (HOSPITAL_COMMUNITY)
Admission: RE | Admit: 2019-03-28 | Discharge: 2019-03-28 | Disposition: A | Discharge status: Home / Self Care | Payer: Managed Care, Other (non HMO) | Source: Ambulatory Visit | Attending: Family Medicine | Admitting: Family Medicine

## 2019-03-28 VITALS — BP 122/82 | HR 84 | Temp 97.1°F | Resp 16 | Ht 63.0 in | Wt 217.8 lb

## 2019-03-28 DIAGNOSIS — R1900 Intra-abdominal and pelvic swelling, mass and lump, unspecified site: Secondary | ICD-10-CM

## 2019-03-28 DIAGNOSIS — R102 Pelvic and perineal pain: Secondary | ICD-10-CM

## 2019-03-28 DIAGNOSIS — M2550 Pain in unspecified joint: Secondary | ICD-10-CM

## 2019-03-28 DIAGNOSIS — E669 Obesity, unspecified: Secondary | ICD-10-CM | POA: Diagnosis not present

## 2019-03-28 DIAGNOSIS — Z975 Presence of (intrauterine) contraceptive device: Secondary | ICD-10-CM

## 2019-03-28 DIAGNOSIS — Z124 Encounter for screening for malignant neoplasm of cervix: Secondary | ICD-10-CM | POA: Insufficient documentation

## 2019-03-28 DIAGNOSIS — N921 Excessive and frequent menstruation with irregular cycle: Secondary | ICD-10-CM

## 2019-03-28 MED ORDER — DICLOFENAC SODIUM 1.6 % EX GEL: 2.0000 g | Freq: Four times a day (QID) | CUTANEOUS | 0 refills | Status: DC

## 2019-03-28 MED ORDER — IBUPROFEN 800 MG PO TABS: 800.0000 mg | ORAL_TABLET | Freq: Three times a day (TID) | ORAL | 0 refills | Status: DC | PRN

## 2019-03-28 NOTE — Telephone Encounter (Signed)
Pharm is calling the diclofenac gel comes in 1% or 3% not 1.6%

## 2019-03-28 NOTE — Progress Notes (Signed)
Name: Catherine Berg   MRN: [id number]    DOB: 06-22-1980   Date:03/28/2019       Progress Note  Subjective  Chief Complaint  Chief Complaint  Patient presents with  . Vaginal Bleeding    Onset-Couple of weeks ago, light bleeding and vaginal irriated  . Abdominal Cramping    Lower left side cramping- constant has tried Ibuprofen with a little relief.  . Joint Swelling    Onset- 2 weeks in bilateral hands, edema, painful and in the morning pain is worst and feels like her skin is pulled tight    HPI  Breakthrough bleeding: she has Nexplanon for contraceptions and no cycles for the past 2 years. She states previously had to take ocp for heavy bleeding. She is concerned because she is only spotting but is having a lot of cramping, disproportionally for the amount of bleeding.  She had telemedicine visit two weeks ago for dysuria and spotting, took keflex and dysuria resolved, but bleeding resumed two days ago and also cramping. Cramping on pelvic area but radiates to left side. She has same sexual partner, no vaginal discharge, no back pain , fever or chills. No change in bowel movements. No personal history of fibroids or ovarian cyst.   Arthralgia: seen by Dr. Renard Matter , IgA was elevated and was advised to go back in 6 months, she states the worse symptoms in hand pain and is affecting her hand writing. We will try topical medication   Patient Active Problem List   Diagnosis Date Noted  . Iron deficiency anemia   . Palpitations 09/16/2018  . Coronary artery calcification 09/16/2018  . 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 Bilateral leg edema 04/20/2017  . Incidental lung nodule, > 3mm and < 8mm 03/31/2017  . Hives 09/02/2015  . Morbid obesity (HCC) 08/15/2015  . Anxiety 07/09/2015  . 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 Thyroid nodule 02/01/2015  . Vitamin D deficiency disease   . Vitamin B12 deficiency   . History of abnormal cervical Pap smear   . Allergy-induced asthma,  mild intermittent, uncomplicated 10/09/2013    Past Surgical History:  Procedure Laterality Date  . CERVICAL POLYPECTOMY    . CESAREAN SECTION     x 2  . COLONOSCOPY WITH PROPOFOL N/A 11/14/2018   Procedure: COLONOSCOPY WITH PROPOFOL;  Surgeon: Pasty Spillers, MD;  Location: [facility];  Service: Endoscopy;  Laterality: N/A;  . ESOPHAGOGASTRODUODENOSCOPY (EGD) WITH PROPOFOL N/A 11/14/2018   Procedure: ESOPHAGOGASTRODUODENOSCOPY (EGD) WITH PROPOFOL;  Surgeon: Pasty Spillers, MD;  Location: [facility];  Service: Endoscopy;  Laterality: N/A;  Latex allergy  . GIVENS CAPSULE STUDY N/A 12/27/2018   Procedure: GIVENS CAPSULE STUDY;  Surgeon: Pasty Spillers, MD;  Location: [facility];  Service: Endoscopy;  Laterality: N/A;    Family History  Problem Relation Age of Onset  . Heart attack Mother 35  . Hypertension Mother   . Asthma Mother   . Cancer Mother        laryngeal  . Diabetes Mother        pre diabetic  . Heart disease Mother   . Mental illness Mother   . Alcohol abuse Mother   . Drug abuse Mother   . Depression Mother   . Anxiety disorder Mother   . Bipolar disorder Mother   . Learning disabilities Brother   . ADD / ADHD Brother   . Asthma Son   . Hyperlipidemia Brother   . Alcohol abuse  Father   . Heart disease Maternal Grandmother        heart attack, pacemaker  . Heart attack Maternal Grandmother   . Hypertension Maternal Grandmother   . Hypertension Maternal Grandfather   . Heart disease Paternal Grandmother        couple of major open heart surgeries, leaking valves  . Hypertension Paternal Grandmother   . Hypertension Paternal Grandfather     Social History   Socioeconomic History  . Marital status: Single    Spouse name: Not on file  . Number of children: 2  . Years of education: Not on file  . Highest education level: High school graduate  Occupational History  . Not on file  Social Needs  . Financial resource  strain: Not hard at all  . Food insecurity    Worry: Never true    Inability: Never true  . Transportation needs    Medical: No    Non-medical: No  Tobacco Use  . Smoking status: Never Smoker  . Smokeless tobacco: Never Used  Substance and Sexual Activity  . Alcohol use: No    Alcohol/week: 0.0 standard drinks  . Drug use: No  . Sexual activity: Yes    Partners: Male    Birth control/protection: None  Lifestyle  . Physical activity    Days per week: 5 days    Minutes per session: 150+ min  . Stress: Rather much  Relationships  . Social connections    Talks on phone: More than three times a week    Gets together: More than three times a week    Attends religious service: More than 4 times per year    Active member of club or organization: Yes    Attends meetings of clubs or organizations: More than 4 times per year    Relationship status: Never married  . Intimate partner violence    Fear of current or ex partner: No    Emotionally abused: No    Physically abused: No    Forced sexual activity: No  Other Topics Concern  . Not on file  Social History Narrative  . Not on file     Current Outpatient Medications:  .  albuterol (PROVENTIL HFA;VENTOLIN HFA) 108 (90 Base) MCG/ACT inhaler, Inhale 2 puffs into the lungs every 6 (six) hours as needed for wheezing or shortness of breath., Disp: 18 g, Rfl: 1 .  Cyanocobalamin (B-12) 1000 MCG SUBL, Place 1 tablet under the tongue daily., Disp: 30 each, Rfl: 5 .  Elastic Bandages & Supports (MEDICAL COMPRESSION STOCKINGS) MISC, Wear from morning to night, but do not sleep in these, Disp: 1 each, Rfl: 2 .  Etonogestrel (NEXPLANON [place]), Inject into the skin., Disp: , Rfl:  .  fluticasone (FLONASE) 50 MCG/ACT nasal spray, Place 2 sprays into both nostrils daily., Disp: 16 g, Rfl: 2 .  hydrOXYzine (ATARAX/VISTARIL) 25 MG tablet, Take 1 tablet (25 mg total) by mouth 3 (three) times daily as needed., Disp: 30 tablet, Rfl: 1 .  levothyroxine  (SYNTHROID) 50 MCG tablet, Take 50 mcg by mouth daily., Disp: , Rfl:  .  pregabalin (LYRICA) 75 MG capsule, Take 1 capsule (75 mg total) by mouth 2 (two) times daily., Disp: 180 capsule, Rfl: 1 .  UNABLE TO FIND, Med Name: Medical compression stockings, Disp: , Rfl:  .  Vitamin D, Ergocalciferol, (DRISDOL) 1.25 MG (50000 UT) CAPS capsule, Take 1 capsule (50,000 Units total) by mouth every 7 (seven) days., Disp: 12 capsule, Rfl:  1 .  cephALEXin (KEFLEX) 500 MG capsule, Take 1 capsule (500 mg total) by mouth 2 (two) times daily. (Patient not taking: Reported on 03/28/2019), Disp: 14 capsule, Rfl: 0 .  montelukast (SINGULAIR) 10 MG tablet, Take 1 tablet (10 mg total) by mouth at bedtime., Disp: 90 tablet, Rfl: 1  Allergies  Allergen Reactions  . Latex Hives    (Balloons, condoms, underwear elastic, gloves)  . Shellfish Allergy Hives    I personally reviewed active problem list, medication list, allergies, family history, social history, health maintenance with the patient/caregiver today.   ROS  Constitutional: Negative for fever or weight change.  Respiratory: Negative for cough and shortness of breath.   Cardiovascular: Negative for chest pain or palpitations.  Gastrointestinal: Negative for abdominal pain, no bowel changes.  Musculoskeletal: Negative for gait problem or joint swelling.  Skin: Negative for rash.  Neurological: Negative for dizziness or headache.  No other specific complaints in a complete review of systems (except as listed in HPI above).  Objective  Vitals:   03/28/19 1358  BP: 122/82  Pulse: 84  Resp: 16  Temp: (!) 97.1 F (36.2 C)  TempSrc: Temporal  SpO2: 98%  Weight: 217 lb 12.8 oz (98.8 kg)  Height: 5\' 3"  (1.6 m)    Body mass index is 38.58 kg/m.  Physical Exam  Constitutional: Patient appears well-developed and well-nourished. ObeseNo distress.  HEENT: head atraumatic, normocephalic, pupils equal and reactive to light, Cardiovascular: Normal  rate, regular rhythm and normal heart sounds.  No murmur heard. No BLE edema. Pulmonary/Chest: Effort normal and breath sounds normal. No respiratory distress. Abdominal: Soft.  There is no tenderness. Muscular skeletal: no synovitis today  Pelvic: painful pelvic exam, blood in the cervical os, no polyps, normal vaginal walls, fullness during palpation of left lower quadrant  Psychiatric: Patient has a normal mood and affect. behavior is normal. Judgment and thought content normal.  Recent Results (from the past 2160 hour(s))  C-reactive protein     Status: Abnormal   Collection Time: 01/05/19 12:00 AM  Result Value Ref Range   CRP 14.3 (H) <8.0 mg/L  Sedimentation rate     Status: Abnormal   Collection Time: 01/05/19 12:00 AM  Result Value Ref Range   Sed Rate 28 (H) 0 - 20 mm/h  Vitamin B12     Status: None   Collection Time: 01/05/19 12:00 AM  Result Value Ref Range   Vitamin B-12 363 200 - 1,100 pg/mL    Comment: . Please Note: Although the reference range for vitamin B12 is [phone] pg/mL, it has been reported that between 5 and 10% of patients with values between 200 and 400 pg/mL may experience neuropsychiatric and hematologic abnormalities due to occult B12 deficiency; less than 1% of patients with values above 400 pg/mL will have symptoms. Marland Kitchen.   VITAMIN D 25 Hydroxy (Vit-D Deficiency, Fractures)     Status: Abnormal   Collection Time: 01/05/19 12:00 AM  Result Value Ref Range   Vit D, 25-Hydroxy 26 (L) 30 - 100 ng/mL    Comment: Vitamin D Status         25-OH Vitamin D: . Deficiency:                    <20 ng/mL Insufficiency:             20 - 29 ng/mL Optimal:                 > or =  30 ng/mL . For 25-OH Vitamin D testing on patients on  D2-supplementation and patients for whom quantitation  of D2 and D3 fractions is required, the Quest[facility]ureD(TM) 25-OH VIT D, (D2,D3), LC/MS/MS is recommended: order  code 07371 (patients >80yrs). See Note 1 . Note 1 . For  additional information, please refer to  http://education.[facility].com/faq/FAQ199  (This link is being provided for informational/ educational purposes only.)   COMPLETE METABOLIC PANEL WITH GFR     Status: None   Collection Time: 01/05/19 12:00 AM  Result Value Ref Range   Glucose, Bld 88 65 - 99 mg/dL    Comment: .            Fasting reference interval .    BUN 11 7 - 25 mg/dL   Creat 0.62 6.94 - 8.54 mg/dL   GFR, Est Non African American 86 > OR = 60 mL/min/1.81m2   GFR, Est African American 100 > OR = 60 mL/min/1.8m2   BUN/Creatinine Ratio NOT APPLICABLE 6 - 22 (calc)   Sodium 136 135 - 146 mmol/L   Potassium 4.6 3.5 - 5.3 mmol/L   Chloride 103 98 - 110 mmol/L   CO2 28 20 - 32 mmol/L   Calcium 9.5 8.6 - 10.2 mg/dL   Total Protein 7.2 6.1 - 8.1 g/dL   Albumin 3.9 3.6 - 5.1 g/dL   Globulin 3.3 1.9 - 3.7 g/dL (calc)   AG Ratio 1.2 1.0 - 2.5 (calc)   Total Bilirubin 0.4 0.2 - 1.2 mg/dL   Alkaline phosphatase (APISO) 63 31 - 125 U/L   AST 19 10 - 30 U/L   ALT 17 6 - 29 U/L  CBC with Differential/Platelet     Status: None   Collection Time: 01/05/19 12:00 AM  Result Value Ref Range   WBC 5.5 3.8 - 10.8 Thousand/uL   RBC 4.33 3.80 - 5.10 Million/uL   Hemoglobin 12.4 11.7 - 15.5 g/dL   HCT 62.7 03.5 - 00.9 %   MCV 84.8 80.0 - 100.0 fL   MCH 28.6 27.0 - 33.0 pg   MCHC 33.8 32.0 - 36.0 g/dL   RDW 38.1 82.9 - 93.7 %   Platelets 327 140 - 400 Thousand/uL   MPV 9.8 7.5 - 12.5 fL   Neutro Abs 2,607 1,500 - 7,800 cells/uL   Lymphs Abs 2,371 850 - 3,900 cells/uL   Absolute Monocytes 380 200 - 950 cells/uL   Eosinophils Absolute 121 15 - 500 cells/uL   Basophils Absolute 22 0 - 200 cells/uL   Neutrophils Relative % 47.4 %   Total Lymphocyte 43.1 %   Monocytes Relative 6.9 %   Eosinophils Relative 2.2 %   Basophils Relative 0.4 %  Iron, TIBC and Ferritin Panel     Status: None   Collection Time: 01/05/19 12:00 AM  Result Value Ref Range   Iron 49 40 - 190 mcg/dL    TIBC 169 678 - 938 mcg/dL (calc)   %SAT 16 16 - 45 % (calc)   Ferritin 55 16 - 154 ng/mL  Hemoglobin A1c     Status: None   Collection Time: 01/05/19 12:00 AM  Result Value Ref Range   Hgb A1c MFr Bld 5.3 <5.7 % of total Hgb    Comment: For the purpose of screening for the presence of diabetes: . <5.7%       Consistent with the absence of diabetes 5.7-6.4%    Consistent with increased risk for diabetes             (  prediabetes) > or =6.5%  Consistent with diabetes . This assay result is consistent with a decreased risk of diabetes. . Currently, no consensus exists regarding use of hemoglobin A1c for diagnosis of diabetes in children. . According to American Diabetes Association (ADA) guidelines, hemoglobin A1c <7.0% represents optimal control in non-pregnant diabetic patients. Different metrics may apply to specific patient populations.  Standards of Medical Care in Diabetes(ADA). .    Mean Plasma Glucose 105 (calc)   eAG (mmol/L) 5.8 (calc)  Hepatitis C antibody     Status: None   Collection Time: 01/05/19 12:00 AM  Result Value Ref Range   Hepatitis C Ab NON-REACTIVE NON-REACTI   SIGNAL TO CUT-OFF 0.03 <1.00    Comment: . HCV antibody was non-reactive. There is no laboratory  evidence of HCV infection. . In most cases, no further action is required. However, if recent HCV exposure is suspected, a test for HCV RNA (test code 16109) is suggested. . For additional information please refer to http://education.[facility].com/faq/FAQ22v1 (This link is being provided for informational/ educational purposes only.) .   Lipid panel     Status: None   Collection Time: 01/05/19 12:00 AM  Result Value Ref Range   Cholesterol 110 <200 mg/dL   HDL 59 > OR = 50 mg/dL   Triglycerides 47 <604 mg/dL   LDL Cholesterol (Calc) 38 mg/dL (calc)    Comment: Reference range: <100 . Desirable range <100 mg/dL for primary prevention;   <70 mg/dL for patients with CHD or  diabetic patients  with > or = 2 CHD risk factors. Marland Kitchen LDL-C is now calculated using the Martin-Hopkins  calculation, which is a validated novel method providing  better accuracy than the Friedewald equation in the  estimation of LDL-C.  Horald Pollen et al. Lenox Ahr. 5409;811(91): 2061-2068  (http://education.[facility].com/faq/FAQ164)    Total CHOL/HDL Ratio 1.9 <5.0 (calc)   Non-HDL Cholesterol (Calc) 51 <478 mg/dL (calc)    Comment: For patients with diabetes plus 1 major ASCVD risk  factor, treating to a non-HDL-C goal of <100 mg/dL  (LDL-C of <29 mg/dL) is considered a therapeutic  option.       PHQ2/9: Depression screen [facility] 2/9 03/28/2019 01/05/2019 10/05/2018 09/14/2018 06/02/2018  Decreased Interest 0 0 0 0 0  Down, Depressed, Hopeless 0 0 0 0 2  PHQ - 2 Score 0 0 0 0 2  Altered sleeping Tired, decreased energy 0 0 Change in appetite 0 0 0 0 0  Feeling bad or failure about yourself  0 0 0 0 0  Trouble concentrating 0 0 0 1 2  Moving slowly or fidgety/restless 0 0 0 0 0  Suicidal thoughts 0 0 0 0 0  PHQ-9 Score Difficult doing work/chores Not difficult at all Not difficult at all Not difficult at all Somewhat difficult Somewhat difficult  Some recent data might be hidden    phq 9 is negative   Fall Risk: Fall Risk  03/28/2019 01/05/2019 10/05/2018 06/02/2018 03/15/2018  Falls in the past year? 0 0 0 0 1  Number falls in past yr: 0 0 0 0 1  Injury with Fall? 0 0 0 0 0  Comment - - - - -     Functional Status Survey: Is the patient deaf or have difficulty hearing?: No Does the patient have difficulty seeing, even when wearing glasses/contacts?: Yes Does the patient have difficulty concentrating, remembering, or making decisions?: No  Does the patient have difficulty walking or climbing stairs?: No Does the patient have difficulty dressing or bathing?: No Does the patient have difficulty doing errands alone such as visiting a doctor's office or  shopping?: No    Assessment & Plan  1. Arthralgia, unspecified joint  - Diclofenac Sodium 1.6 % GEL; Apply 2 g topically 4 (four) times daily.  Dispense: 250 g; Refill: 0  2. Breakthrough bleeding on Nexplanon  - COMPLETE METABOLIC PANEL WITH GFR - CBC with Differential/Platelet - US Pelvis Complete; Future  3. Obesity (BMI 30-39.9)  Lost 20 lbs since last visit, exercising and changing diet  4. Cervical cancer screening  - Cytology - PAP  5. Pelvic pain  - US Pelvis Complete; Future  6. Pelvic fullness in female  - US Pelvis Complete; Future

## 2019-03-29 ENCOUNTER — Other Ambulatory Visit: Payer: Self-pay | Admitting: Family Medicine

## 2019-03-29 LAB — CBC WITH DIFFERENTIAL/PLATELET
Absolute Monocytes: 445 cells/uL (ref 200–950)
Basophils Absolute: 29 cells/uL (ref 0–200)
Basophils Relative: 0.4 %
Eosinophils Absolute: 139 cells/uL (ref 15–500)
Eosinophils Relative: 1.9 %
HCT: 38.6 % (ref 35.0–45.0)
Hemoglobin: 12.7 g/dL (ref 11.7–15.5)
Lymphs Abs: 3351 cells/uL (ref 850–3900)
MCH: 28.1 pg (ref 27.0–33.0)
MCHC: 32.9 g/dL (ref 32.0–36.0)
MCV: 85.4 fL (ref 80.0–100.0)
MPV: 10.6 fL (ref 7.5–12.5)
Monocytes Relative: 6.1 %
Neutro Abs: 3336 cells/uL (ref 1500–7800)
Neutrophils Relative %: 45.7 %
Platelets: 319 10*3/uL (ref 140–400)
RBC: 4.52 10*6/uL (ref 3.80–5.10)
RDW: 13.2 % (ref 11.0–15.0)
Total Lymphocyte: 45.9 %
WBC: 7.3 10*3/uL (ref 3.8–10.8)

## 2019-03-29 LAB — COMPLETE METABOLIC PANEL WITH GFR
AG Ratio: 1.1 (calc) (ref 1.0–2.5)
ALT: 19 U/L (ref 6–29)
AST: 20 U/L (ref 10–30)
Albumin: 4 g/dL (ref 3.6–5.1)
Alkaline phosphatase (APISO): 66 U/L (ref 31–125)
BUN: 12 mg/dL (ref 7–25)
CO2: 26 mmol/L (ref 20–32)
Calcium: 9.6 mg/dL (ref 8.6–10.2)
Chloride: 104 mmol/L (ref 98–110)
Creat: 0.82 mg/dL (ref 0.50–1.10)
GFR, Est African American: 104 mL/min/{1.73_m2} (ref >=60)
GFR, Est Non African American: 90 mL/min/{1.73_m2} (ref >=60)
Globulin: 3.5 g/dL (calc) (ref 1.9–3.7)
Glucose, Bld: 89 mg/dL (ref 65–99)
Potassium: 4.3 mmol/L (ref 3.5–5.3)
Sodium: 137 mmol/L (ref 135–146)
Total Bilirubin: 0.4 mg/dL (ref 0.2–1.2)
Total Protein: 7.5 g/dL (ref 6.1–8.1)

## 2019-03-29 MED ORDER — DICLOFENAC SODIUM 1 % EX CREA: 2.0000 g | TOPICAL_CREAM | Freq: Four times a day (QID) | CUTANEOUS | 2 refills | Status: DC

## 2019-03-29 NOTE — Telephone Encounter (Signed)
Pharmacy called in and stated that they will have to order the 3% because they rarely despence it and her ins will not cover the 3% it will only cover the 1%?   Best number [phone]

## 2019-03-31 LAB — CYTOLOGY - PAP
Chlamydia: NEGATIVE
Diagnosis: NEGATIVE
High risk HPV: NEGATIVE
Neisseria Gonorrhea: NEGATIVE
Trichomonas: NEGATIVE

## 2019-04-10 ENCOUNTER — Ambulatory Visit: Payer: Managed Care, Other (non HMO) | Admitting: Family Medicine

## 2019-04-20 ENCOUNTER — Encounter: Payer: Self-pay | Admitting: Gastroenterology

## 2019-04-20 ENCOUNTER — Ambulatory Visit (INDEPENDENT_AMBULATORY_CARE_PROVIDER_SITE_OTHER): Payer: Managed Care, Other (non HMO) | Admitting: Gastroenterology

## 2019-04-20 DIAGNOSIS — Z791 Long term (current) use of non-steroidal anti-inflammatories (NSAID): Secondary | ICD-10-CM

## 2019-04-20 DIAGNOSIS — R1010 Upper abdominal pain, unspecified: Secondary | ICD-10-CM | POA: Diagnosis not present

## 2019-04-20 DIAGNOSIS — R109 Unspecified abdominal pain: Secondary | ICD-10-CM

## 2019-04-20 MED ORDER — FAMOTIDINE 20 MG PO TABS: 20.0000 mg | ORAL_TABLET | Freq: Every day | ORAL | 1 refills | Status: DC

## 2019-04-20 NOTE — Progress Notes (Signed)
Catherine Antigua, MD [address]  [place]  [place], [place] [address]  Main: [phone]  Fax: [phone]   Primary Care Physician: Steele Sizer, MD  Virtual Visit via Video Note  I connected with patient on 04/20/19 at  3:30 PM EST by video (using [email or web address]) and verified that I am speaking with the correct person using two identifiers.   I discussed the limitations, risks, security and privacy concerns of performing an evaluation and management service by video and the availability of in person appointments. I also discussed with the patient that there may be a patient responsible charge related to this service. The patient expressed understanding and agreed to proceed.  Location of Patient: Home Location of Provider: Home Persons involved: Patient and provider only (Nursing staff checked in patient via phone but were not physically involved in the video interaction - see their notes)   History of Present Illness: Chief Complaint  Patient presents with  . Anemia    Patient has had some abdominal pain on the right side.     HPI: Catherine Berg is a 38 y.o. female with previous history of iron deficiency anemia here for follow-up.  Now reporting upper quadrant abdominal pain that started about a month ago.  She started diclofenac gel at the same time for pain in her hands and uses this daily.  Ibuprofen was also prescribed at the same time but patient states she never took this.  No nausea or vomiting.  No weight loss.  No blood in stool.  No altered bowel habits.  Iron deficiency anemia work-up has been completed with EGD, colonoscopy, small bowel capsule.   Iron and hemoglobin improved with replacement  Current Outpatient Medications  Medication Sig Dispense Refill  . albuterol (PROVENTIL HFA;VENTOLIN HFA) 108 (90 Base) MCG/ACT inhaler Inhale 2 puffs into the lungs every 6 (six) hours as needed for wheezing or shortness of breath. 18 g 1  . Cyanocobalamin (B-12)  1000 MCG SUBL Place 1 tablet under the tongue daily. 30 each 5  . Diclofenac Sodium 1 % CREA Apply 2 g topically 4 (four) times daily. 120 g 2  . Elastic Bandages & Supports (MEDICAL COMPRESSION STOCKINGS) MISC Wear from morning to night, but do not sleep in these 1 each 2  . Etonogestrel (NEXPLANON [place]) Inject into the skin.    . fluticasone (FLONASE) 50 MCG/ACT nasal spray Place 2 sprays into both nostrils daily. 16 g 2  . hydrOXYzine (ATARAX/VISTARIL) 25 MG tablet Take 1 tablet (25 mg total) by mouth 3 (three) times daily as needed. 30 tablet 1  . ibuprofen (ADVIL) 800 MG tablet Take 1 tablet (800 mg total) by mouth every 8 (eight) hours as needed. 60 tablet 0  . levothyroxine (SYNTHROID) 50 MCG tablet Take 50 mcg by mouth daily.    . montelukast (SINGULAIR) 10 MG tablet Take 1 tablet (10 mg total) by mouth at bedtime. 90 tablet 1  . pregabalin (LYRICA) 75 MG capsule Take 1 capsule (75 mg total) by mouth 2 (two) times daily. 180 capsule 1  . UNABLE TO FIND Med Name: Medical compression stockings    . Vitamin D, Ergocalciferol, (DRISDOL) 1.25 MG (50000 UT) CAPS capsule Take 1 capsule (50,000 Units total) by mouth every 7 (seven) days. 12 capsule 1  . famotidine (PEPCID) 20 MG tablet Take 1 tablet (20 mg total) by mouth daily. 30 tablet 1   No current facility-administered medications for this visit.    Allergies as of  04/20/2019 - Review Complete 04/20/2019  Allergen Reaction Noted  . Latex Hives 07/30/2014  . Shellfish allergy Hives 07/30/2014    Review of Systems:    All systems reviewed and negative except where noted in HPI.   Observations/Objective:  Labs: CMP     Component Value Date/Time   NA 137 03/28/2019 0000   NA 137 07/21/2016 0000   NA 134 (L) 08/13/2012 2254   K 4.3 03/28/2019 0000   K 3.9 08/13/2012 2254   CL 104 03/28/2019 0000   CL 105 08/13/2012 2254   CO2 26 03/28/2019 0000   CO2 23 08/13/2012 2254   GLUCOSE 89 03/28/2019 0000   GLUCOSE 101 (H) 08/13/2012  2254   BUN 12 03/28/2019 0000   BUN 9 07/21/2016 0000   BUN 10 08/13/2012 2254   CREATININE 0.82 03/28/2019 0000   CALCIUM 9.6 03/28/2019 0000   CALCIUM 9.0 08/13/2012 2254   PROT 7.5 03/28/2019 0000   PROT 7.2 07/21/2016 0000   ALBUMIN 4.0 05/31/2018 2332   ALBUMIN 3.9 07/21/2016 0000   AST 20 03/28/2019 0000   ALT 19 03/28/2019 0000   ALKPHOS 60 05/31/2018 2332   BILITOT 0.4 03/28/2019 0000   BILITOT 0.2 07/21/2016 0000   GFRNONAA 90 03/28/2019 0000   GFRAA 104 03/28/2019 0000   Lab Results  Component Value Date   WBC 7.3 03/28/2019   HGB 12.7 03/28/2019   HCT 38.6 03/28/2019   MCV 85.4 03/28/2019   PLT 319 03/28/2019    Imaging Studies: No results found.  Assessment and Plan:   Catherine Berg is a 38 y.o. y/o female with upper quadrant pain for 1 month after starting NSAIDs  Assessment and Plan: Patient unable to discontinue her diclofenac gel as that is the only thing that helps her hand pain  She is not taking the ibuprofen as listed on her medication list  Adverse effects of NSAIDs causing ulcers discussed  Since patient unable to come off her diclofenac gel, will prescribe Pepcid once daily to see if it helps with her symptoms.  If it does not I have asked her to call us back in 2 to 3 weeks and we can change to PPI or increase Pepcid to twice daily if symptoms have improved but not resolved  Continue to minimize NSAIDs as you can  Follow Up Instructions: 6 to 12 months   I discussed the assessment and treatment plan with the patient. The patient was provided an opportunity to ask questions and all were answered. The patient agreed with the plan and demonstrated an understanding of the instructions.   The patient was advised to call back or seek an in-person evaluation if the symptoms worsen or if the condition fails to improve as anticipated.  I provided 15 minutes of face-to-face time via video software during this encounter. Additional time was spent in  reviewing patient's chart, placing orders etc.   Pasty Spillers, MD  Speech recognition software was used to dictate this note.

## 2019-05-15 ENCOUNTER — Ambulatory Visit (INDEPENDENT_AMBULATORY_CARE_PROVIDER_SITE_OTHER): Payer: Managed Care, Other (non HMO) | Admitting: Family Medicine

## 2019-05-15 ENCOUNTER — Encounter: Payer: Self-pay | Admitting: Family Medicine

## 2019-05-15 DIAGNOSIS — N62 Hypertrophy of breast: Secondary | ICD-10-CM

## 2019-05-15 DIAGNOSIS — J4520 Mild intermittent asthma, uncomplicated: Secondary | ICD-10-CM

## 2019-05-15 DIAGNOSIS — G5793 Unspecified mononeuropathy of bilateral lower limbs: Secondary | ICD-10-CM | POA: Diagnosis not present

## 2019-05-15 DIAGNOSIS — J3089 Other allergic rhinitis: Secondary | ICD-10-CM

## 2019-05-15 DIAGNOSIS — G4709 Other insomnia: Secondary | ICD-10-CM

## 2019-05-15 DIAGNOSIS — E538 Deficiency of other specified B group vitamins: Secondary | ICD-10-CM | POA: Diagnosis not present

## 2019-05-15 DIAGNOSIS — Z8639 Personal history of other endocrine, nutritional and metabolic disease: Secondary | ICD-10-CM

## 2019-05-15 DIAGNOSIS — E559 Vitamin D deficiency, unspecified: Secondary | ICD-10-CM

## 2019-05-15 DIAGNOSIS — J302 Other seasonal allergic rhinitis: Secondary | ICD-10-CM

## 2019-05-15 MED ORDER — TRAZODONE HCL 50 MG PO TABS: 25.0000 mg | ORAL_TABLET | Freq: Every evening | ORAL | 0 refills | Status: DC | PRN

## 2019-05-15 MED ORDER — MONTELUKAST SODIUM 10 MG PO TABS: 10.0000 mg | ORAL_TABLET | Freq: Every day | ORAL | 1 refills | Status: DC

## 2019-05-15 MED ORDER — PREGABALIN 75 MG PO CAPS: 75.0000 mg | ORAL_CAPSULE | Freq: Two times a day (BID) | ORAL | 1 refills | Status: DC

## 2019-05-15 NOTE — Progress Notes (Signed)
Name: Catherine Berg   MRN: [id number]    DOB: 11/20/1980   Date:05/15/2019       Progress Note  Subjective  Chief Complaint  Chief Complaint  Patient presents with  . Medication Refill    3 month F/U  . Iron deficiency anemia  . History of depression  . Asthma Mild intermittent  . Vitamin D and B12 deficiency  . Insomnia  . Morbid Obesity    HPI   History of iron deficiency anemia: she saw  Dr. Maximino Greenlandahiliani and had colonoscopy and endoscopy and had a capsule endoscopy all negative . She has Nexplanon and does have cycles. Unknown cause of anemia, but labs back to normal now and she is not taking any supplementation at this time  Asthma Mild intermittent: she is under the care of Dr. Linward Natalamachandra and states no longer taking taking Symbicort, she states symptoms controlled with singulair, no need for rescue inhaler lately.  No cough, wheezing but has SOB intermittent . She states he is no longer under her plan. Explained we can fill her prescriptions if needed   Vitamin D and B12 deficiency: taking supplementation, she has neuropathy and is taking Lyrica she states it seems to help with symptoms. She states no longer has constant pain, pain is now intermittent and when present pain is still high 8/10, but only lasts a couple of days and improves after that. She thinks seems associated with increase in activity   History of depression: around the time her mother died in 2016 , she was on medications for a period of times, she still has some anxiety and takes hydroxizine prn, but not lately, felt down when in quarantine but is doing better now  Insomnia: she falls asleep but wakes up in the middle of the night and cannot fall back asleep, we discussed trazodone last visit but we did not sent to pharmacy, I will send it now   Morbid Obesity: she states gained weight at age 39 when she started depo and also ocp's afterwards, heaviest weight is now, she gained 1 lbs since last visit, she  was in quarantine in Dec 2020. She is still doing calorie counting thought [employer], down to 1500 calories per day, having protein for breakfast, portion control, avoiding fast food   CRP: we will recheck next visit since not due for labs. She has intermittent arthralgia and elevated CRP seen by Dr. Renard MatterBock in the past . Feels stiff in the mornings for at least one hour. It even affects her writing   Large breast: she states indentation under bra straps, makes her shoulders ache and is thinking again about breast reduction surgery . She wears 338 D  Patient Active Problem List   Diagnosis Date Noted  . Iron deficiency anemia   . Palpitations 09/16/2018  . Coronary artery calcification 09/16/2018  . 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 Bilateral leg edema 04/20/2017  . Incidental lung nodule, > 3mm and < 8mm 03/31/2017  . Hives 09/02/2015  . Morbid obesity (HCC) 08/15/2015  . Anxiety 07/09/2015  . 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 Thyroid nodule 02/01/2015  . Vitamin D deficiency disease   . Vitamin B12 deficiency   . History of abnormal cervical Pap smear   . Allergy-induced asthma, mild intermittent, uncomplicated 10/09/2013    Past Surgical History:  Procedure Laterality Date  . CERVICAL POLYPECTOMY    . CESAREAN SECTION     x 2  . COLONOSCOPY WITH PROPOFOL N/A 11/14/2018  Procedure: COLONOSCOPY WITH PROPOFOL;  Surgeon: Pasty Spillers, MD;  Location: [facility];  Service: Endoscopy;  Laterality: N/A;  . ESOPHAGOGASTRODUODENOSCOPY (EGD) WITH PROPOFOL N/A 11/14/2018   Procedure: ESOPHAGOGASTRODUODENOSCOPY (EGD) WITH PROPOFOL;  Surgeon: Pasty Spillers, MD;  Location: [facility];  Service: Endoscopy;  Laterality: N/A;  Latex allergy  . GIVENS CAPSULE STUDY N/A 12/27/2018   Procedure: GIVENS CAPSULE STUDY;  Surgeon: Pasty Spillers, MD;  Location: [facility];  Service: Endoscopy;  Laterality: N/A;    Family History  Problem  Relation Age of Onset  . Heart attack Mother 50  . Hypertension Mother   . Asthma Mother   . Cancer Mother        laryngeal  . Diabetes Mother        pre diabetic  . Heart disease Mother   . Mental illness Mother   . Alcohol abuse Mother   . Drug abuse Mother   . Depression Mother   . Anxiety disorder Mother   . Bipolar disorder Mother   . Learning disabilities Brother   . ADD / ADHD Brother   . Asthma Son   . Hyperlipidemia Brother   . Alcohol abuse Father   . Heart disease Maternal Grandmother        heart attack, pacemaker  . Heart attack Maternal Grandmother   . Hypertension Maternal Grandmother   . Hypertension Maternal Grandfather   . Heart disease Paternal Grandmother        couple of major open heart surgeries, leaking valves  . Hypertension Paternal Grandmother   . Hypertension Paternal Grandfather       Current Outpatient Medications:  .  albuterol (PROVENTIL HFA;VENTOLIN HFA) 108 (90 Base) MCG/ACT inhaler, Inhale 2 puffs into the lungs every 6 (six) hours as needed for wheezing or shortness of breath., Disp: 18 g, Rfl: 1 .  Cyanocobalamin (B-12) 1000 MCG SUBL, Place 1 tablet under the tongue daily., Disp: 30 each, Rfl: 5 .  Diclofenac Sodium 1 % CREA, Apply 2 g topically 4 (four) times daily., Disp: 120 g, Rfl: 2 .  Elastic Bandages & Supports (MEDICAL COMPRESSION STOCKINGS) MISC, Wear from morning to night, but do not sleep in these, Disp: 1 each, Rfl: 2 .  Etonogestrel (NEXPLANON [place]), Inject into the skin., Disp: , Rfl:  .  famotidine (PEPCID) 20 MG tablet, Take 1 tablet (20 mg total) by mouth daily., Disp: 30 tablet, Rfl: 1 .  fluticasone (FLONASE) 50 MCG/ACT nasal spray, Place 2 sprays into both nostrils daily., Disp: 16 g, Rfl: 2 .  hydrOXYzine (ATARAX/VISTARIL) 25 MG tablet, Take 1 tablet (25 mg total) by mouth 3 (three) times daily as needed., Disp: 30 tablet, Rfl: 1 .  ibuprofen (ADVIL) 800 MG tablet, Take 1 tablet (800 mg total) by mouth every 8 (eight)  hours as needed., Disp: 60 tablet, Rfl: 0 .  levothyroxine (SYNTHROID) 50 MCG tablet, Take 50 mcg by mouth daily., Disp: , Rfl:  .  montelukast (SINGULAIR) 10 MG tablet, Take 1 tablet (10 mg total) by mouth at bedtime., Disp: 90 tablet, Rfl: 1 .  pregabalin (LYRICA) 75 MG capsule, Take 1 capsule (75 mg total) by mouth 2 (two) times daily., Disp: 180 capsule, Rfl: 1 .  UNABLE TO FIND, Med Name: Medical compression stockings, Disp: , Rfl:  .  Vitamin D, Ergocalciferol, (DRISDOL) 1.25 MG (50000 UT) CAPS capsule, Take 1 capsule (50,000 Units total) by mouth every 7 (seven) days., Disp: 12 capsule, Rfl: 1  Allergies  Allergen Reactions  . Latex Hives    (Balloons, condoms, underwear elastic, gloves)  . Shellfish Allergy Hives    I personally reviewed active problem list, medication list, allergies, family history, social history, health maintenance with the patient/caregiver today.   ROS  Constitutional: Negative for fever or weight change.  Respiratory: Negative for cough and shortness of breath.   Cardiovascular: Negative for chest pain or palpitations.  Gastrointestinal: Negative for abdominal pain, no bowel changes.  Musculoskeletal: Negative for gait problem, positive for  joint swelling.  Skin: Negative for rash.  Neurological: Negative for dizziness or headache.  No other specific complaints in a complete review of systems (except as listed in HPI above).  Objective  Vitals:   05/15/19 1112  BP: 140/76  Pulse: 74  Resp: 16  Temp: (!) 97.5 F (36.4 C)  TempSrc: Temporal  SpO2: 99%  Weight: 218 lb 8 oz (99.1 kg)  Height: 5\' 3"  (1.6 m)    Body mass index is 38.71 kg/m.  Physical Exam  Constitutional: Patient appears well-developed and well-nourished. Obese  No distress.  HEENT: head atraumatic, normocephalic, pupils equal and reactive to light Cardiovascular: Normal rate, regular rhythm and normal heart sounds.  No murmur heard. No BLE edema. Pulmonary/Chest: Effort  normal and breath sounds normal. No respiratory distress. Abdominal: Soft.  There is no tenderness. Muscular skeletal: indentation on both shoulder  Psychiatric: Patient has a normal mood and affect. behavior is normal. Judgment and thought content normal.  Recent Results (from the past 2160 hour(s))  COMPLETE METABOLIC PANEL WITH GFR     Status: None   Collection Time: 03/28/19 12:00 AM  Result Value Ref Range   Glucose, Bld 89 65 - 99 mg/dL    Comment: .            Fasting reference interval .    BUN 12 7 - 25 mg/dL   Creat 03/30/19 9.21 - 1.94 mg/dL   GFR, Est Non African American 90 > OR = 60 mL/min/1.45m2   GFR, Est African American 104 > OR = 60 mL/min/1.91m2   BUN/Creatinine Ratio NOT APPLICABLE 6 - 22 (calc)   Sodium 137 135 - 146 mmol/L   Potassium 4.3 3.5 - 5.3 mmol/L   Chloride 104 98 - 110 mmol/L   CO2 26 20 - 32 mmol/L   Calcium 9.6 8.6 - 10.2 mg/dL   Total Protein 7.5 6.1 - 8.1 g/dL   Albumin 4.0 3.6 - 5.1 g/dL   Globulin 3.5 1.9 - 3.7 g/dL (calc)   AG Ratio 1.1 1.0 - 2.5 (calc)   Total Bilirubin 0.4 0.2 - 1.2 mg/dL   Alkaline phosphatase (APISO) 66 31 - 125 U/L   AST 20 10 - 30 U/L   ALT 19 6 - 29 U/L  CBC with Differential/Platelet     Status: None   Collection Time: 03/28/19 12:00 AM  Result Value Ref Range   WBC 7.3 3.8 - 10.8 Thousand/uL   RBC 4.52 3.80 - 5.10 Million/uL   Hemoglobin 12.7 11.7 - 15.5 g/dL   HCT 03/30/19 08.1 - 44.8 %   MCV 85.4 80.0 - 100.0 fL   MCH 28.1 27.0 - 33.0 pg   MCHC 32.9 32.0 - 36.0 g/dL   RDW 18.5 63.1 - 49.7 %   Platelets 319 140 - 400 Thousand/uL   MPV 10.6 7.5 - 12.5 fL   Neutro Abs 3,336 1,500 - 7,800 cells/uL   Lymphs Abs 3,351 850 - 3,900 cells/uL   Absolute  Monocytes 445 200 - 950 cells/uL   Eosinophils Absolute 139 15 - 500 cells/uL   Basophils Absolute 29 0 - 200 cells/uL   Neutrophils Relative % 45.7 %   Total Lymphocyte 45.9 %   Monocytes Relative 6.1 %   Eosinophils Relative 1.9 %   Basophils Relative 0.4 %   Cytology - PAP     Status: None   Collection Time: 03/28/19  2:32 PM  Result Value Ref Range   High risk HPV Negative    Neisseria Gonorrhea Negative    Chlamydia Negative    Trichomonas Negative    Adequacy      Satisfactory for evaluation; transformation zone component PRESENT.   Diagnosis      - Negative for intraepithelial lesion or malignancy (NILM)     PHQ2/9: Depression screen [facility] 2/9 05/15/2019 03/28/2019 01/05/2019 10/05/2018 09/14/2018  Decreased Interest 0 0 0 0 0  Down, Depressed, Hopeless 0 0 0 0 0  PHQ - 2 Score 0 0 0 0 0  Altered sleeping 0 1 1 1 3   Tired, decreased energy 0 0 0 1 3  Change in appetite 0 0 0 0 0  Feeling bad or failure about yourself  0 0 0 0 0  Trouble concentrating 0 0 0 0 1  Moving slowly or fidgety/restless 0 0 0 0 0  Suicidal thoughts 0 0 0 0 0  PHQ-9 Score 0 1 1 2 7   Difficult doing work/chores Not difficult at all Not difficult at all Not difficult at all Not difficult at all Somewhat difficult  Some recent data might be hidden    phq 9 is negative   Fall Risk: Fall Risk  05/15/2019 03/28/2019 01/05/2019 10/05/2018 06/02/2018  Falls in the past year? 0 0 0 0 0  Number falls in past yr: 0 0 0 0 0  Injury with Fall? 0 0 0 0 0  Comment - - - - -     Functional Status Survey: Is the patient deaf or have difficulty hearing?: No Does the patient have difficulty seeing, even when wearing glasses/contacts?: Yes Does the patient have difficulty concentrating, remembering, or making decisions?: No Does the patient have difficulty walking or climbing stairs?: No Does the patient have difficulty dressing or bathing?: No Does the patient have difficulty doing errands alone such as visiting a doctor's office or shopping?: No   Assessment & Plan  1. Morbid obesity (HCC)  Discussed with the patient the risk posed by an increased BMI. Discussed importance of portion control, calorie counting and at least 150 minutes of physical activity weekly. Avoid  sweet beverages and drink more water. Eat at least 6 servings of fruit and vegetables daily   2. Vitamin B12 deficiency   3. Neuropathy involving both lower extremities  - pregabalin (LYRICA) 75 MG capsule; Take 1 capsule (75 mg total) by mouth 2 (two) times daily.  Dispense: 180 capsule; Refill: 1  4. Vitamin D deficiency disease  Continue vitamin D   5. History of  Iron deficiency anemia   6. Perennial allergic rhinitis with seasonal variation   7. Other insomnia  - pregabalin (LYRICA) 75 MG capsule; Take 1 capsule (75 mg total) by mouth 2 (two) times daily.  Dispense: 180 capsule; Refill: 1  8. Allergy-induced asthma, mild intermittent, uncomplicated  - montelukast (SINGULAIR) 10 MG tablet; Take 1 tablet (10 mg total) by mouth at bedtime.  Dispense: 90 tablet; Refill: 1  9. Large breasts  She will research who is  under her plan

## 2019-06-15 ENCOUNTER — Encounter: Payer: Self-pay | Admitting: Family Medicine

## 2019-06-15 NOTE — Progress Notes (Signed)
Patient ID: Catherine Berg, female    DOB: 1980-09-26, 39 y.o.   MRN: [id number]  PCP: [name], MD  Chief Complaint  Patient presents with  . Underarm abscess    Right side, was a hard bump and then turned into a boil 2/8     Subjective:   Catherine Berg is a 39 y.o. female, presents to clinic with CC of the following:  Chief Complaint  Patient presents with  . Underarm abscess    Right side, was a hard bump and then turned into a boil 2/8     HPI:  Pt. Is a 39 y.o female patient of Dr. Carlynn Purl whose last visit was 05/15/2019 with that note reviewed. Presents with concern for an abscess, under her right arm.  She noted at first a firm slightly raised area, and then noted it became more like a boil about 4 days ago. Is more painful last couple days. Has not applied anything topically.  She has continued to work, and works Advertising account executive as well.  Denies any fevers.  No red streaks down the arm, or up over the shoulder, although she did note a red blotches on her right forearm, and a couple small little areas on her upper shoulder area in the last couple days.  She has a shellfish allergy and is allergic to latex.     Patient Active Problem List   Diagnosis Date Noted  . Iron deficiency anemia   . Palpitations 09/16/2018  . Coronary artery calcification 09/16/2018  . 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 Bilateral leg edema 04/20/2017  . Incidental lung nodule, > 36mm and < 12mm 03/31/2017  . Hives 09/02/2015  . Morbid obesity (HCC) 08/15/2015  . Anxiety 07/09/2015  . 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 Thyroid nodule 02/01/2015  . Vitamin D deficiency disease   . Vitamin B12 deficiency   . History of abnormal cervical Pap smear   . Allergy-induced asthma, mild intermittent, uncomplicated 10/09/2013      Current Outpatient Medications:  .  albuterol (PROVENTIL HFA;VENTOLIN HFA) 108 (90 Base) MCG/ACT inhaler, Inhale 2 puffs into the lungs every 6 (six)  hours as needed for wheezing or shortness of breath., Disp: 18 g, Rfl: 1 .  Cyanocobalamin (B-12) 1000 MCG SUBL, Place 1 tablet under the tongue daily., Disp: 30 each, Rfl: 5 .  Diclofenac Sodium 1 % CREA, Apply 2 g topically 4 (four) times daily., Disp: 120 g, Rfl: 2 .  Elastic Bandages & Supports (MEDICAL COMPRESSION STOCKINGS) MISC, Wear from morning to night, but do not sleep in these, Disp: 1 each, Rfl: 2 .  Etonogestrel (NEXPLANON [place]), Inject into the skin., Disp: , Rfl:  .  famotidine (PEPCID) 20 MG tablet, Take 1 tablet (20 mg total) by mouth daily., Disp: 30 tablet, Rfl: 1 .  fluticasone (FLONASE) 50 MCG/ACT nasal spray, Place 2 sprays into both nostrils daily., Disp: 16 g, Rfl: 2 .  hydrOXYzine (ATARAX/VISTARIL) 25 MG tablet, Take 1 tablet (25 mg total) by mouth 3 (three) times daily as needed., Disp: 30 tablet, Rfl: 1 .  ibuprofen (ADVIL) 800 MG tablet, Take 1 tablet (800 mg total) by mouth every 8 (eight) hours as needed., Disp: 60 tablet, Rfl: 0 .  levothyroxine (SYNTHROID) 50 MCG tablet, Take 50 mcg by mouth daily., Disp: , Rfl:  .  montelukast (SINGULAIR) 10 MG tablet, Take 1 tablet (10 mg total) by mouth at bedtime., Disp: 90 tablet, Rfl: 1 .  pregabalin (LYRICA)  75 MG capsule, Take 1 capsule (75 mg total) by mouth 2 (two) times daily., Disp: 180 capsule, Rfl: 1 .  traZODone (DESYREL) 50 MG tablet, Take 0.5-1 tablets (25-50 mg total) by mouth at bedtime as needed for sleep., Disp: 60 tablet, Rfl: 0 .  UNABLE TO FIND, Med Name: Medical compression stockings, Disp: , Rfl:  .  Vitamin D, Ergocalciferol, (DRISDOL) 1.25 MG (50000 UT) CAPS capsule, Take 1 capsule (50,000 Units total) by mouth every 7 (seven) days., Disp: 12 capsule, Rfl: 1 .  cephALEXin (KEFLEX) 500 MG capsule, Take 1 capsule (500 mg total) by mouth 3 (three) times daily for 5 days., Disp: 15 capsule, Rfl: 0   Allergies  Allergen Reactions  . Latex Hives    (Balloons, condoms, underwear elastic, gloves)  . Shellfish  Allergy Hives     Past Surgical History:  Procedure Laterality Date  . CERVICAL POLYPECTOMY    . CESAREAN SECTION     x 2  . COLONOSCOPY WITH PROPOFOL N/A 11/14/2018   Procedure: COLONOSCOPY WITH PROPOFOL;  Surgeon: Virgel Manifold, MD;  Location: [facility];  Service: Endoscopy;  Laterality: N/A;  . ESOPHAGOGASTRODUODENOSCOPY (EGD) WITH PROPOFOL N/A 11/14/2018   Procedure: ESOPHAGOGASTRODUODENOSCOPY (EGD) WITH PROPOFOL;  Surgeon: Virgel Manifold, MD;  Location: [facility];  Service: Endoscopy;  Laterality: N/A;  Latex allergy  . GIVENS CAPSULE STUDY N/A 12/27/2018   Procedure: GIVENS CAPSULE STUDY;  Surgeon: Virgel Manifold, MD;  Location: [facility];  Service: Endoscopy;  Laterality: N/A;     Family History  Problem Relation Age of Onset  . Heart attack Mother 76  . Hypertension Mother   . Asthma Mother   . Cancer Mother        laryngeal  . Diabetes Mother        pre diabetic  . Heart disease Mother   . Mental illness Mother   . Alcohol abuse Mother   . Drug abuse Mother   . Depression Mother   . Anxiety disorder Mother   . Bipolar disorder Mother   . Learning disabilities Brother   . ADD / ADHD Brother   . Asthma Son   . Hyperlipidemia Brother   . Alcohol abuse Father   . Heart disease Maternal Grandmother        heart attack, pacemaker  . Heart attack Maternal Grandmother   . Hypertension Maternal Grandmother   . Hypertension Maternal Grandfather   . Heart disease Paternal Grandmother        couple of major open heart surgeries, leaking valves  . Hypertension Paternal Grandmother   . Hypertension Paternal Grandfather      Social History   Socioeconomic History  . Marital status: Single    Spouse name: Not on file  . Number of children: 2  . Years of education: Not on file  . Highest education level: High school graduate  Occupational History  . Not on file  Tobacco Use  . Smoking status: Never Smoker  . Smokeless  tobacco: Never Used  Substance and Sexual Activity  . Alcohol use: No    Alcohol/week: 0.0 standard drinks  . Drug use: No  . Sexual activity: Yes    Partners: Male    Birth control/protection: None  Other Topics Concern  . Not on file  Social History Narrative  . Not on file   Social Determinants of Health   Financial Resource Strain:   . Difficulty of Paying Living Expenses: Not on file  Food Insecurity:   . Worried About Programme researcher, broadcasting/film/video in the Last Year: Not on file  . Ran Out of Food in the Last Year: Not on file  Transportation Needs:   . Lack of Transportation (Medical): Not on file  . Lack of Transportation (Non-Medical): Not on file  Physical Activity:   . Days of Exercise per Week: Not on file  . Minutes of Exercise per Session: Not on file  Stress:   . Feeling of Stress : Not on file  Social Connections:   . Frequency of Communication with Friends and Family: Not on file  . Frequency of Social Gatherings with Friends and Family: Not on file  . Attends Religious Services: Not on file  . Active Member of Clubs or Organizations: Not on file  . Attends Banker Meetings: Not on file  . Marital Status: Not on file  Intimate Partner Violence:   . Fear of Current or Ex-Partner: Not on file  . Emotionally Abused: Not on file  . Physically Abused: Not on file  . Sexually Abused: Not on file    With staff assistance, above reviewed with the patient/caregiver today.  ROS: As per HPI, otherwise no specific complaints on a limited and focused system review   No results found for this or any previous visit (from the past 72 hour(s)).   PHQ2/9: Depression screen [facility] 2/9 06/16/2019 05/15/2019 03/28/2019 01/05/2019 10/05/2018  Decreased Interest 0 0 0 0 0  Down, Depressed, Hopeless 0 0 0 0 0  PHQ - 2 Score 0 0 0 0 0  Altered sleeping 0 0 1 1 1   Tired, decreased energy 0 0 0 0 1  Change in appetite 0 0 0 0 0  Feeling bad or failure about yourself  0 0 0 0 0    Trouble concentrating 0 0 0 0 0  Moving slowly or fidgety/restless 0 0 0 0 0  Suicidal thoughts 0 0 0 0 0  PHQ-9 Score 0 0 1 1 2   Difficult doing work/chores Not difficult at all Not difficult at all Not difficult at all Not difficult at all Not difficult at all  Some recent data might be hidden   PHQ-2/9 Result is neg  Fall Risk: Fall Risk  06/16/2019 05/15/2019 03/28/2019 01/05/2019 10/05/2018  Falls in the past year? 0 0 0 0 0  Number falls in past yr: 0 0 0 0 0  Injury with Fall? 0 0 0 0 0  Comment - - - - -      Objective:   Vitals:   06/16/19 0740  BP: 120/84  Pulse: 90  Resp: 16  Temp: (!) 97.3 F (36.3 C)  TempSrc: Temporal  SpO2: 99%  Weight: 219 lb 12.8 oz (99.7 kg)  Height: 5\' 3"  (1.6 m)    Body mass index is 38.94 kg/m.  Physical Exam   NAD, masked, pleasant HEENT - sclera anicteric, Skin- Right axilla - a raised erythematous boil type lesion was present with it coming to a head immediately inferior to the mid axilla, with approximately 2 cm of induration surrounding, the majority extending towards the chest wall. She was very tender palpating the area where indurated. No erythematous streaks down the arm nor up to the shoulder Neuro/psychiatric - affect was not flat, appropriate with conversation     Results for orders placed or performed in visit on 03/28/19  COMPLETE METABOLIC PANEL WITH GFR  Result Value Ref Range   Glucose, Bld  89 65 - 99 mg/dL   BUN 12 7 - 25 mg/dL   Creat 6.50 3.54 - 6.56 mg/dL   GFR, Est Non African American 90 > OR = 60 mL/min/1.26m2   GFR, Est African American 104 > OR = 60 mL/min/1.47m2   BUN/Creatinine Ratio NOT APPLICABLE 6 - 22 (calc)   Sodium 137 135 - 146 mmol/L   Potassium 4.3 3.5 - 5.3 mmol/L   Chloride 104 98 - 110 mmol/L   CO2 26 20 - 32 mmol/L   Calcium 9.6 8.6 - 10.2 mg/dL   Total Protein 7.5 6.1 - 8.1 g/dL   Albumin 4.0 3.6 - 5.1 g/dL   Globulin 3.5 1.9 - 3.7 g/dL (calc)   AG Ratio 1.1 1.0 - 2.5 (calc)   Total  Bilirubin 0.4 0.2 - 1.2 mg/dL   Alkaline phosphatase (APISO) 66 31 - 125 U/L   AST 20 10 - 30 U/L   ALT 19 6 - 29 U/L  CBC with Differential/Platelet  Result Value Ref Range   WBC 7.3 3.8 - 10.8 Thousand/uL   RBC 4.52 3.80 - 5.10 Million/uL   Hemoglobin 12.7 11.7 - 15.5 g/dL   HCT 81.2 75.1 - 70.0 %   MCV 85.4 80.0 - 100.0 fL   MCH 28.1 27.0 - 33.0 pg   MCHC 32.9 32.0 - 36.0 g/dL   RDW 17.4 94.4 - 96.7 %   Platelets 319 140 - 400 Thousand/uL   MPV 10.6 7.5 - 12.5 fL   Neutro Abs 3,336 1,500 - 7,800 cells/uL   Lymphs Abs 3,351 850 - 3,900 cells/uL   Absolute Monocytes 445 200 - 950 cells/uL   Eosinophils Absolute 139 15 - 500 cells/uL   Basophils Absolute 29 0 - 200 cells/uL   Neutrophils Relative % 45.7 %   Total Lymphocyte 45.9 %   Monocytes Relative 6.1 %   Eosinophils Relative 1.9 %   Basophils Relative 0.4 %  Cytology - PAP  Result Value Ref Range   High risk HPV Negative    Neisseria Gonorrhea Negative    Chlamydia Negative    Trichomonas Negative    Adequacy      Satisfactory for evaluation; transformation zone component PRESENT.   Diagnosis      - Negative for intraepithelial lesion or malignancy (NILM)       Assessment & Plan:   1. Abscess of right axilla Discussed options and felt best to try to open to help area drain. She was in agreement.  Procedure Note: Melissa, CMA present for the procedure and helpful Informed consent obtained, risks and benefits of procedure noted with patient, and why best to try to open with a needle at the area coming to a head and help drain with heat best approach at present given the location (axilla) and risk of further infection noted. Noted if not improving, likely will need to use a scaple and open a bigger area with then letting heal with packing the wound and secondary intention and she was understanding of this and in agreement with this more conservative approach today.  The area was cleansed with alcohol  The site was  locally anesthetized with approx  1.5 cc's of 1% Lidocaine An 18 gauge needle was inserted into the area coming to a head and with pressure on wound,a fair amount of bloody purulence drained from the area. Some induration persisted after. Patient tolerated the procedure well  The wound was cleansed and dressing applied with help from nursing (gauze with  paper tape)  Should continue with warm compresses and keep wound clean and loosely covered   - cephALEXin (KEFLEX) 500 MG capsule; Take 1 capsule (500 mg total) by mouth 3 (three) times daily for 5 days.  Dispense: 15 capsule; Refill: 0  Warm compresses to area every couple hours for next few days helpful to get continued drainage. Also, take the antibiotic as prescribed. Can use OTC ibuprofen or tylenol products prn short term If significantly worsens over next 24-48 hours, with any fevers, increased redness extending up or down the arm,  f/u at urgent care or emergency setting recommended Can f/u early next week if not resolving      Jamelle Haring, MD 06/16/19 8:46 AM

## 2019-06-16 ENCOUNTER — Other Ambulatory Visit: Payer: Self-pay

## 2019-06-16 ENCOUNTER — Encounter: Payer: Self-pay | Admitting: Internal Medicine

## 2019-06-16 ENCOUNTER — Ambulatory Visit (INDEPENDENT_AMBULATORY_CARE_PROVIDER_SITE_OTHER): Payer: Managed Care, Other (non HMO) | Admitting: Internal Medicine

## 2019-06-16 VITALS — BP 120/84 | HR 90 | Temp 97.3°F | Resp 16 | Ht 63.0 in | Wt 219.8 lb

## 2019-06-16 DIAGNOSIS — L02411 Cutaneous abscess of right axilla: Secondary | ICD-10-CM

## 2019-06-16 MED ORDER — CEPHALEXIN 500 MG PO CAPS: 500.0000 mg | ORAL_CAPSULE | Freq: Three times a day (TID) | ORAL | 0 refills | Status: AC

## 2019-06-16 NOTE — Patient Instructions (Signed)
Warm compresses to area every couple hours for next few days helpful.  Also, take the antibiotic as prescribed.  If significantly worsens over next 24-48 hours, with any fevers, increased redness extending up or down the arm,  f/u at urgent care or emergency setting recommended   Skin Abscess  A skin abscess is an infected area of your skin that contains pus and other material. An abscess can happen in any part of your body. Some abscesses break open (rupture) on their own. Most continue to get worse unless they are treated. The infection can spread deeper into the body and into your blood, which can make you feel sick. A skin abscess is caused by germs that enter the skin through a cut or scrape. It can also be caused by blocked oil and sweat glands or infected hair follicles. This condition is usually treated by:  Draining the pus.  Taking antibiotic medicines.  Placing a warm, wet washcloth over the abscess. Follow these instructions at home: Medicines   Take over-the-counter and prescription medicines only as told by your doctor.  If you were prescribed an antibiotic medicine, take it as told by your doctor. Do not stop taking the antibiotic even if you start to feel better. Abscess care   If you have an abscess that has not drained, place a warm, clean, wet washcloth over the abscess several times a day. Do this as told by your doctor.  Follow instructions from your doctor about how to take care of your abscess. Make sure you: ? Cover the abscess with a bandage (dressing). ? Change your bandage or gauze as told by your doctor. ? Wash your hands with soap and water before you change the bandage or gauze. If you cannot use soap and water, use hand sanitizer.  Check your abscess every day for signs that the infection is getting worse. Check for: ? More redness, swelling, or pain. ? More fluid or blood. ? Warmth. ? More pus or a bad smell. General instructions  To avoid  spreading the infection: ? Do not share personal care items, towels, or hot tubs with others. ? Avoid making skin-to-skin contact with other people.  Keep all follow-up visits as told by your doctor. This is important. Contact a doctor if:  You have more redness, swelling, or pain around your abscess.  You have more fluid or blood coming from your abscess.  Your abscess feels warm when you touch it.  You have more pus or a bad smell coming from your abscess.  You have a fever.  Your muscles ache.  You have chills.  You feel sick. Get help right away if:  You have very bad (severe) pain.  You see red streaks on your skin spreading away from the abscess. Summary  A skin abscess is an infected area of your skin that contains pus and other material.  The abscess is caused by germs that enter the skin through a cut or scrape. It can also be caused by blocked oil and sweat glands or infected hair follicles.  Follow your doctor's instructions on caring for your abscess, taking medicines, preventing infections, and keeping follow-up visits. This information is not intended to replace advice given to you by your health care provider. Make sure you discuss any questions you have with your health care provider. Document Revised: 11/24/2018 Document Reviewed: 06/03/2017 Elsevier Patient Education  2020 [employer].

## 2019-06-19 NOTE — Progress Notes (Signed)
Cardiology Office Note    Date:  06/27/2019   ID:  Soul, Deveney Jan 09, 1981, MRN [id number]  PCP:  [name], MD  Cardiologist:  Yvonne Kendall, MD  Electrophysiologist:  None   Chief Complaint: Follow-up  History of Present Illness:   Catherine Berg is a 39 y.o. female with history of HTN, Graves' disease, iron deficiency anemia, impaired glucose tolerance, and anxiety who presents for follow-up of shortness of breath and palpitations.  Prior echo ordered by PCP for lower extremity swelling and 10/2015 showed an EF of 55 to 65%, trivial mitral regurgitation, normal RV systolic function and cavity size, and normal sized aorta.  She was evaluated by cardiology virtually in 09/2018 noting shortness of breath and frequent palpitations.  In this setting, as well as with possible previously noted coronary artery calcification on imaging, she underwent Lexiscan MPI in 10/2018 which showed no significant ischemia with an EF of 55%.  No significant coronary artery calcification was noted.  Initially, outpatient cardiac monitoring was not completed.  She was seen in follow-up in 11/2018 noting she continued to feel about the same as her last visit with continued shortness of breath and palpitations.  She continued to struggle with weight gain and fatigue.  It was noted her levothyroxine had recently been titrated.  She subsequently underwent outpatient cardiac monitoring which showed a predominant rhythm of sinus with an average heart rate of 84 bpm (range 53 to 171 bpm), rare PACs and PVCs were noted, no sustained arrhythmias or prolonged pauses.  Patient reported symptoms corresponded to sinus rhythm, PACs, and PVCs.  She comes in doing well today.  Since she was last seen she has noted an improvement in her dyspnea and palpitations.  She does note randomly occurring intermittent episodes of chest pressure which will last for 10 to 12 seconds followed by spontaneous resolution without  associated symptoms.  She does report a significant family history of heart disease on her mother's side of the family, including her mother having an MI in her early 68s.  She feels well on her titrated dose of levothyroxine with subsequent TSH being normal in 03/2019.  She does not have any issues or concerns at this time.   Labs independently reviewed: 03/2019 - Hgb 12.7, PLT 319, BUN 12, serum creatinine 0.82, potassium 4.3, albumin 4.0, AST/ALT normal, TSH normal 01/2019 - TC 110, TG 47, HDL 59, LDL 38, A1c 5.3  Past Medical History:  Diagnosis Date  . Abnormal thyroid blood test   . Anemia   . Anxiety   . Asthma   . Complication of anesthesia    itching after c sections  . 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 Graves disease   . History of abnormal cervical Pap smear   . History of cervical polypectomy   . Hives 09/02/2015  . Hypertension   . IFG (impaired fasting glucose)   . Incidental lung nodule, > 75mm and < 61mm 03/31/2017   4 mm RLL lung nodule on chest CT Mar 31, 2017  . Low serum vitamin D   . Obesity   . Pregnancy induced hypertension    with 1st pregnancy, normal pressure with 2nd  . Reflux   . Thyroid disease   . Vitamin B12 deficiency   . Vitamin D deficiency disease   . Wears dentures    full upper    Past Surgical History:  Procedure Laterality Date  . CERVICAL POLYPECTOMY    .  CESAREAN SECTION     x 2  . COLONOSCOPY WITH PROPOFOL N/A 11/14/2018   Procedure: COLONOSCOPY WITH PROPOFOL;  Surgeon: Virgel Manifold, MD;  Location: [facility];  Service: Endoscopy;  Laterality: N/A;  . ESOPHAGOGASTRODUODENOSCOPY (EGD) WITH PROPOFOL N/A 11/14/2018   Procedure: ESOPHAGOGASTRODUODENOSCOPY (EGD) WITH PROPOFOL;  Surgeon: Virgel Manifold, MD;  Location: [facility];  Service: Endoscopy;  Laterality: N/A;  Latex allergy  . GIVENS CAPSULE STUDY N/A 12/27/2018   Procedure: GIVENS CAPSULE STUDY;  Surgeon: Virgel Manifold, MD;  Location: [facility];  Service: Endoscopy;  Laterality: N/A;    Current Medications: Current Meds  Medication Sig  . albuterol (PROVENTIL HFA;VENTOLIN HFA) 108 (90 Base) MCG/ACT inhaler Inhale 2 puffs into the lungs every 6 (six) hours as needed for wheezing or shortness of breath.  . Cyanocobalamin (B-12) 1000 MCG SUBL Place 1 tablet under the tongue daily.  . Diclofenac Sodium 1 % CREA Apply 2 g topically 4 (four) times daily.  Water engineer Bandages & Supports (MEDICAL COMPRESSION STOCKINGS) MISC Wear from morning to night, but do not sleep in these  . Etonogestrel (NEXPLANON [place]) Inject into the skin.  . famotidine (PEPCID) 20 MG tablet Take 1 tablet (20 mg total) by mouth daily.  . fluticasone (FLONASE) 50 MCG/ACT nasal spray Place 2 sprays into both nostrils daily.  . hydrOXYzine (ATARAX/VISTARIL) 25 MG tablet Take 1 tablet (25 mg total) by mouth 3 (three) times daily as needed.  Marland Kitchen ibuprofen (ADVIL) 800 MG tablet Take 1 tablet (800 mg total) by mouth every 8 (eight) hours as needed.  Marland Kitchen levothyroxine (SYNTHROID) 50 MCG tablet Take 50 mcg by mouth daily.  . montelukast (SINGULAIR) 10 MG tablet Take 1 tablet (10 mg total) by mouth at bedtime.  . pregabalin (LYRICA) 75 MG capsule Take 1 capsule (75 mg total) by mouth 2 (two) times daily.  Marland Kitchen sulfamethoxazole-trimethoprim (BACTRIM DS) 800-160 MG tablet Take 1 tablet by mouth 2 (two) times daily for 10 days.  . traZODone (DESYREL) 50 MG tablet Take 0.5-1 tablets (25-50 mg total) by mouth at bedtime as needed for sleep.  . Vitamin D, Ergocalciferol, (DRISDOL) 1.25 MG (50000 UT) CAPS capsule Take 1 capsule (50,000 Units total) by mouth every 7 (seven) days.    Allergies:   Latex and Shellfish allergy   Social History   Socioeconomic History  . Marital status: Single    Spouse name: Not on file  . Number of children: 2  . Years of education: Not on file  . Highest education level: High school graduate  Occupational History  . Not  on file  Tobacco Use  . Smoking status: Never Smoker  . Smokeless tobacco: Never Used  Substance and Sexual Activity  . Alcohol use: No    Alcohol/week: 0.0 standard drinks  . Drug use: No  . Sexual activity: Yes    Partners: Male    Birth control/protection: None  Other Topics Concern  . Not on file  Social History Narrative  . Not on file   Social Determinants of Health   Financial Resource Strain:   . Difficulty of Paying Living Expenses: Not on file  Food Insecurity:   . Worried About Charity fundraiser in the Last Year: Not on file  . Ran Out of Food in the Last Year: Not on file  Transportation Needs:   . Lack of Transportation (Medical): Not on file  . Lack of Transportation (Non-Medical): Not on file  Physical Activity:   .  Days of Exercise per Week: Not on file  . Minutes of Exercise per Session: Not on file  Stress:   . Feeling of Stress : Not on file  Social Connections:   . Frequency of Communication with Friends and Family: Not on file  . Frequency of Social Gatherings with Friends and Family: Not on file  . Attends Religious Services: Not on file  . Active Member of Clubs or Organizations: Not on file  . Attends Banker Meetings: Not on file  . Marital Status: Not on file     Family History:  The patient's family history includes ADD / ADHD in her brother; Alcohol abuse in her father and mother; Anxiety disorder in her mother; Asthma in her mother and son; Bipolar disorder in her mother; Cancer in her mother; Depression in her mother; Diabetes in her mother; Drug abuse in her mother; Heart attack in her maternal grandmother; Heart attack (age of onset: 36) in her mother; Heart disease in her maternal grandmother, mother, and paternal grandmother; Hyperlipidemia in her brother; Hypertension in her maternal grandfather, maternal grandmother, mother, paternal grandfather, and paternal grandmother; Learning disabilities in her brother; Mental illness  in her mother.  ROS:   Review of Systems  Constitutional: Negative for chills, diaphoresis, fever, malaise/fatigue and weight loss.  HENT: Negative for congestion.   Eyes: Negative for discharge and redness.  Respiratory: Negative for cough, sputum production, shortness of breath and wheezing.   Cardiovascular: Positive for chest pain. Negative for palpitations, orthopnea, claudication, leg swelling and PND.       Randomly occurring 10 to 12 seconds episodes of chest pain  Gastrointestinal: Negative for abdominal pain, heartburn, nausea and vomiting.  Musculoskeletal: Negative for falls and myalgias.  Skin: Negative for rash.  Neurological: Negative for dizziness, tingling, tremors, sensory change, speech change, focal weakness, loss of consciousness and weakness.  Psychiatric/Behavioral: Negative for substance abuse. The patient is not nervous/anxious.   All other systems reviewed and are negative.    EKGs/Labs/Other Studies Reviewed:    Studies reviewed were summarized above. The additional studies were reviewed today:  2D echo 10/2015: - Left ventricle: The cavity size was normal. Systolic function was  normal. The estimated ejection fraction was in the range of 55%  to 65%.  - Aortic valve: Valve area (Vmax): 2.42 cm^2. __________  Eugenie Birks MPI 10/2018:  Normal pharmacologic myocardial perfusion stress without significant ischemia or scar.  The left ventricular ejection fraction is normal (55%).  There is no significant coronary artery calcification on the attenuation correction CT.  This is a low risk study. __________  Luci Bank patch 11/2018:  The patient was monitored for 11 days, 10 hours.  The predominant rhythm was sinus, with an average rate of 84 bpm (range 53-171 bpm).  Rare PAC's and PVC's were noted.  There was no sustained arrhythmia or prolonged pause.  Patient resported symptoms correspond to sinus rhythm, PAC's, and PVC's.   Predominantly sinus  rhythm with rare PAC's and PVC's.  No significant arrhythmia.   EKG:  EKG is ordered today.  The EKG ordered today demonstrates NSR, 84 bpm, normal axis, no acute ST-T changes  Recent Labs: 03/28/2019: ALT 19; BUN 12; Creat 0.82; Hemoglobin 12.7; Platelets 319; Potassium 4.3; Sodium 137  Recent Lipid Panel    Component Value Date/Time   CHOL 110 01/05/2019 0000   TRIG 47 01/05/2019 0000   HDL 59 01/05/2019 0000   CHOLHDL 1.9 01/05/2019 0000   LDLCALC 38 01/05/2019 0000  PHYSICAL EXAM:    VS:  BP 120/80 (BP Location: Left Arm, Patient Position: Sitting, Cuff Size: Normal)   Pulse 84   Ht 5\' 3"  (1.6 m)   Wt 219 lb 8 oz (99.6 kg)   SpO2 98%   BMI 38.88 kg/m   BMI: Body mass index is 38.88 kg/m.  Physical Exam  Constitutional: She is oriented to person, place, and time. She appears well-developed and well-nourished.  HENT:  Head: Normocephalic and atraumatic.  Eyes: Right eye exhibits no discharge. Left eye exhibits no discharge.  Neck: No JVD present.  Cardiovascular: Normal rate, regular rhythm, S1 normal, S2 normal and normal heart sounds. Exam reveals no distant heart sounds, no friction rub, no midsystolic click and no opening snap.  No murmur heard. Pulses:      Posterior tibial pulses are 2+ on the right side and 2+ on the left side.  Pulmonary/Chest: Effort normal and breath sounds normal. No respiratory distress. She has no decreased breath sounds. She has no wheezes. She has no rales. She exhibits no tenderness.  Abdominal: Soft. She exhibits no distension. There is no abdominal tenderness.  Musculoskeletal:        General: No edema.     Cervical back: Normal range of motion.  Neurological: She is alert and oriented to person, place, and time.  Skin: Skin is warm and dry. No cyanosis. Nails show no clubbing.  Psychiatric: She has a normal mood and affect. Her speech is normal and behavior is normal. Judgment and thought content normal.    Wt Readings from Last  3 Encounters:  06/27/19 219 lb 8 oz (99.6 kg)  06/26/19 221 lb (100.2 kg)  06/16/19 219 lb 12.8 oz (99.7 kg)     ASSESSMENT & PLAN:   1. Atypical chest pain: Symptoms are atypical in presentation and that they are randomly occurring and will last for 10 to 12 seconds followed by spontaneous resolution.  Work-up including echo and Lexiscan MPI are unrevealing.  CT images corresponding with her MPI showed no coronary artery calcification.  She has a very well controlled lipid panel as outlined above, not on medical therapy.  No further cardiac work-up is warranted at this time.  2. SOB: Much improved.  Cardiac work-up has been unrevealing.  No further work-up at this time.  3. Palpitations: No further issues.  Zio patch without significant arrhythmia as outlined above.  No further work-up.   Disposition: F/u with Dr. 08/14/19 or an APP as needed.   Medication Adjustments/Labs and Tests Ordered: Current medicines are reviewed at length with the patient today.  Concerns regarding medicines are outlined above. Medication changes, Labs and Tests ordered today are summarized above and listed in the Patient Instructions accessible in Encounters.   Signed, Okey Dupre, PA-C 06/27/2019 2:49 PM     [facility] [place] [address] [place], [place] [phone]

## 2019-06-20 ENCOUNTER — Encounter: Payer: Self-pay | Admitting: Family Medicine

## 2019-06-22 ENCOUNTER — Encounter: Payer: Self-pay | Admitting: Emergency Medicine

## 2019-06-22 ENCOUNTER — Other Ambulatory Visit: Payer: Self-pay

## 2019-06-22 ENCOUNTER — Ambulatory Visit (INDEPENDENT_AMBULATORY_CARE_PROVIDER_SITE_OTHER): Payer: Managed Care, Other (non HMO) | Admitting: Family Medicine

## 2019-06-22 ENCOUNTER — Encounter: Payer: Self-pay | Admitting: Family Medicine

## 2019-06-22 DIAGNOSIS — L02411 Cutaneous abscess of right axilla: Secondary | ICD-10-CM | POA: Diagnosis not present

## 2019-06-22 NOTE — Progress Notes (Signed)
Name: Catherine Berg   MRN: [id number]    DOB: [birth year]   Date:06/22/2019       Progress Note  Subjective  Chief Complaint  Chief Complaint  Patient presents with  . Follow-up    Abcess in right arm. Saw Dr. Roxan Hockey on 06/16/19    I connected with  [name] on 06/22/19 at  9:40 AM EST by telephone and verified that I am speaking with the correct person using two identifiers.   I discussed the limitations, risks, security and privacy concerns of performing an evaluation and management service by telephone and the availability of in person appointments. Staff also discussed with the patient that there may be a patient responsible charge related to this service. Patient Location: at work  Provider Location:at home   HPI  Abscess Axilla: she was seen on 06/16/2019 by Dr. Roxan Hockey for right axillary abscess. She had an I&D and sent home on Cephalexin for 7 days. She sent a picture yesterday, she still has induration and pain on her right breast also , but no redness on her breast. Pain on breast seems to be a throbbing sensation that radiates from the axilla to the breast, this pain started a few days ago, the pain seems to be unchanged. Abscess is still draining and the pain on the abscess site is not as intense but is still present. No fever or chills. Discussed referral to surgeon and she agrees . She has been back to work   Patient Active Problem List   Diagnosis Date Noted  . Iron deficiency anemia   . Palpitations 09/16/2018  . Coronary artery calcification 09/16/2018  . 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 Bilateral leg edema 04/20/2017  . Incidental lung nodule, > 12mm and < 35mm 03/31/2017  . Hives 09/02/2015  . Morbid obesity ([place]) 08/15/2015  . Anxiety 07/09/2015  . 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 Thyroid nodule 02/01/2015  . Vitamin D deficiency disease   . Vitamin B12 deficiency   . History of abnormal cervical Pap smear   .  Allergy-induced asthma, mild intermittent, uncomplicated 38/25/0539    Social History   Tobacco Use  . Smoking status: Never Smoker  . Smokeless tobacco: Never Used  Substance Use Topics  . Alcohol use: No    Alcohol/week: 0.0 standard drinks     Current Outpatient Medications:  .  albuterol (PROVENTIL HFA;VENTOLIN HFA) 108 (90 Base) MCG/ACT inhaler, Inhale 2 puffs into the lungs every 6 (six) hours as needed for wheezing or shortness of breath., Disp: 18 g, Rfl: 1 .  Cyanocobalamin (B-12) 1000 MCG SUBL, Place 1 tablet under the tongue daily., Disp: 30 each, Rfl: 5 .  Diclofenac Sodium 1 % CREA, Apply 2 g topically 4 (four) times daily., Disp: 120 g, Rfl: 2 .  Elastic Bandages & Supports (MEDICAL COMPRESSION STOCKINGS) MISC, Wear from morning to night, but do not sleep in these, Disp: 1 each, Rfl: 2 .  Etonogestrel (NEXPLANON [place]), Inject into the skin., Disp: , Rfl:  .  famotidine (PEPCID) 20 MG tablet, Take 1 tablet (20 mg total) by mouth daily., Disp: 30 tablet, Rfl: 1 .  fluticasone (FLONASE) 50 MCG/ACT nasal spray, Place 2 sprays into both nostrils daily., Disp: 16 g, Rfl: 2 .  hydrOXYzine (ATARAX/VISTARIL) 25 MG tablet, Take 1 tablet (25 mg total) by mouth 3 (three) times daily as needed., Disp: 30 tablet, Rfl: 1 .  ibuprofen (ADVIL) 800 MG tablet, Take 1 tablet (800 mg total)  by mouth every 8 (eight) hours as needed., Disp: 60 tablet, Rfl: 0 .  levothyroxine (SYNTHROID) 50 MCG tablet, Take 50 mcg by mouth daily., Disp: , Rfl:  .  montelukast (SINGULAIR) 10 MG tablet, Take 1 tablet (10 mg total) by mouth at bedtime., Disp: 90 tablet, Rfl: 1 .  pregabalin (LYRICA) 75 MG capsule, Take 1 capsule (75 mg total) by mouth 2 (two) times daily., Disp: 180 capsule, Rfl: 1 .  traZODone (DESYREL) 50 MG tablet, Take 0.5-1 tablets (25-50 mg total) by mouth at bedtime as needed for sleep., Disp: 60 tablet, Rfl: 0 .  UNABLE TO FIND, Med Name: Medical compression stockings, Disp: , Rfl:  .  Vitamin  D, Ergocalciferol, (DRISDOL) 1.25 MG (50000 UT) CAPS capsule, Take 1 capsule (50,000 Units total) by mouth every 7 (seven) days., Disp: 12 capsule, Rfl: 1  Allergies  Allergen Reactions  . Latex Hives    (Balloons, condoms, underwear elastic, gloves)  . Shellfish Allergy Hives    I personally reviewed active problem list, medication list, allergies, family history, social history with the patient/caregiver today.  ROS  Ten systems reviewed and is negative except as mentioned in HPI   Objective  Virtual encounter, vitals not obtained   Physical Exam  Awake, alert and oriented   Assessment & Plan  1. Abscess of right axilla  - Ambulatory referral to General Surgery   -Red flags and when to present for emergency care or RTC including fever >101.44F, chest pain, shortness of breath, new/worsening/un-resolving symptoms,  reviewed with patient at time of visit. Follow up and care instructions discussed and provided in AVS. - I discussed the assessment and treatment plan with the patient. The patient was provided an opportunity to ask questions and all were answered. The patient agreed with the plan and demonstrated an understanding of the instructions.  - The patient was advised to call back or seek an in-person evaluation if the symptoms worsen or if the condition fails to improve as anticipated.  I provided 15 minutes of non-face-to-face time during this encounter.  Ruel Favors, MD

## 2019-06-26 ENCOUNTER — Other Ambulatory Visit: Payer: Self-pay

## 2019-06-26 ENCOUNTER — Ambulatory Visit (INDEPENDENT_AMBULATORY_CARE_PROVIDER_SITE_OTHER): Payer: Managed Care, Other (non HMO) | Admitting: Surgery

## 2019-06-26 ENCOUNTER — Encounter: Payer: Self-pay | Admitting: Surgery

## 2019-06-26 VITALS — BP 132/88 | HR 87 | Temp 98.1°F | Resp 13 | Ht 63.0 in | Wt 221.0 lb

## 2019-06-26 DIAGNOSIS — L732 Hidradenitis suppurativa: Secondary | ICD-10-CM

## 2019-06-26 DIAGNOSIS — L02419 Cutaneous abscess of limb, unspecified: Secondary | ICD-10-CM

## 2019-06-26 DIAGNOSIS — Z1231 Encounter for screening mammogram for malignant neoplasm of breast: Secondary | ICD-10-CM

## 2019-06-26 DIAGNOSIS — N644 Mastodynia: Secondary | ICD-10-CM

## 2019-06-26 MED ORDER — SULFAMETHOXAZOLE-TRIMETHOPRIM 800-160 MG PO TABS: 1.0000 | ORAL_TABLET | Freq: Two times a day (BID) | ORAL | 0 refills | Status: AC

## 2019-06-26 NOTE — Patient Instructions (Addendum)
Please fill and start your antibiotics. We will see you back in 2 weeks for a follow up. We will get you schedule for a mammogram also.  Hidradenitis Suppurativa Hidradenitis suppurativa is a long-term (chronic) skin disease. It is similar to a severe form of acne, but it affects areas of the body where acne would be unusual, especially areas of the body where skin rubs against skin and becomes moist. These include:  Underarms.  Groin.  Genital area.  Buttocks.  Upper thighs.  Breasts. Hidradenitis suppurativa may start out as small lumps or pimples caused by blocked sweat glands or hair follicles. Pimples may develop into deep sores that break open (rupture) and drain pus. Over time, affected areas of skin may thicken and become scarred. This condition is rare and does not spread from person to person (non-contagious). What are the causes? The exact cause of this condition is not known. It may be related to:  Female and female hormones.  An overactive disease-fighting system (immune system). The immune system may over-react to blocked hair follicles or sweat glands and cause swelling and pus-filled sores. What increases the risk? You are more likely to develop this condition if you:  Are female.  Are 5-31 years old.  Have a family history of hidradenitis suppurativa.  Have a personal history of acne.  Are overweight.  Smoke.  Take the medicine lithium. What are the signs or symptoms? The first symptoms are usually painful bumps in the skin, similar to pimples. The condition may get worse over time (progress), or it may only cause mild symptoms. If the disease progresses, symptoms may include:  Skin bumps getting bigger and growing deeper into the skin.  Bumps rupturing and draining pus.  Itchy, infected skin.  Skin getting thicker and scarred.  Tunnels under the skin (fistulas) where pus drains from a bump.  Pain during daily activities, such as pain during walking  if your groin area is affected.  Emotional problems, such as stress or depression. This condition may affect your appearance and your ability or willingness to wear certain clothes or do certain activities. How is this diagnosed? This condition is diagnosed by a health care provider who specializes in skin diseases (dermatologist). You may be diagnosed based on:  Your symptoms and medical history.  A physical exam.  Testing a pus sample for infection.  Blood tests. How is this treated? Your treatment will depend on how severe your symptoms are. The same treatment will not work for everybody with this condition. You may need to try several treatments to find what works best for you. Treatment may include:  Cleaning and bandaging (dressing) your wounds as needed.  Lifestyle changes, such as new skin care routines.  Taking medicines, such as: ? Antibiotics. ? Acne medicines. ? Medicines to reduce the activity of the immune system. ? A diabetes medicine (metformin). ? Birth control pills, for women. ? Steroids to reduce swelling and pain.  Working with a mental health care provider, if you experience emotional distress due to this condition. If you have severe symptoms that do not get better with medicine, you may need surgery. Surgery may involve:  Using a laser to clear the skin and remove hair follicles.  Opening and draining deep sores.  Removing the areas of skin that are diseased and scarred. Follow these instructions at home: Medicines   Take over-the-counter and prescription medicines only as told by your health care provider.  If you were prescribed an antibiotic medicine, take  it as told by your health care provider. Do not stop taking the antibiotic even if your condition improves. Skin care  If you have open wounds, cover them with a clean dressing as told by your health care provider. Keep wounds clean by washing them gently with soap and water when you bathe.   Do not shave the areas where you get hidradenitis suppurativa.  Do not wear deodorant.  Wear loose-fitting clothes.  Try to avoid getting overheated or sweaty. If you get sweaty or wet, change into clean, dry clothes as soon as you can.  To help relieve pain and itchiness, cover sore areas with a warm, clean washcloth (warm compress) for 5-10 minutes as often as needed.  If told by your health care provider, take a bleach bath twice a week: ? Fill your bathtub halfway with water. ? Pour in  cup of unscented household bleach. ? Soak in the tub for 5-10 minutes. ? Only soak from the neck down. Avoid water on your face and hair. ? Shower to rinse off the bleach from your skin. General instructions  Learn as much as you can about your disease so that you have an active role in your treatment. Work closely with your health care provider to find treatments that work for you.  If you are overweight, work with your health care provider to lose weight as recommended.  Do not use any products that contain nicotine or tobacco, such as cigarettes and e-cigarettes. If you need help quitting, ask your health care provider.  If you struggle with living with this condition, talk with your health care provider or work with a mental health care provider as recommended.  Keep all follow-up visits as told by your health care provider. This is important. Where to find more information  Hidradenitis Suppurativa [employer].: https://www.hs-foundation.org/ Contact a health care provider if you have:  A flare-up of hidradenitis suppurativa.  A fever or chills.  Trouble controlling your symptoms at home.  Trouble doing your daily activities because of your symptoms.  Trouble dealing with emotional problems related to your condition. Summary  Hidradenitis suppurativa is a long-term (chronic) skin disease. It is similar to a severe form of acne, but it affects areas of the body where acne would  be unusual.  The first symptoms are usually painful bumps in the skin, similar to pimples. The condition may get worse over time (progress), or it may only cause mild symptoms.  If you have open wounds, cover them with a clean dressing as told by your health care provider. Keep wounds clean by washing them gently with soap and water when you bathe.  Besides skin care, treatment may include medicines, laser treatment, and surgery. This information is not intended to replace advice given to you by your health care provider. Make sure you discuss any questions you have with your health care provider. Document Revised: 04/28/2017 Document Reviewed: 04/28/2017 Elsevier Patient Education  2020 [employer].

## 2019-06-26 NOTE — Addendum Note (Signed)
Addended by: Sinda Du on: 06/26/2019 10:19 AM   Modules accepted: Orders

## 2019-06-26 NOTE — Progress Notes (Signed)
Patient ID: Catherine Berg, female   DOB: 09-May-1980, 39 y.o.   MRN: [id number]  HPI Catherine Berg is a 39 y.o. female seen in consultation at the request of Dr. Carlynn Purl.  Over the last couple weeks she reports some axillary pain that is sharp intermittent and moderate intensity.  She did have some point time some discharge.  Dr. Carlynn Purl have started her on Keflex and apparently did some needle aspiration.  She continues to have symptoms.  She denies any fevers any chills.  No family history significant for breast cancer.  No prior breast issues.  She denies any breast lumps or discharge.  No weight loss.  He has never had a mammogram.  She did have a CT scan of the chest last year that I have personally reviewed showing only a stable benign nodule on the right lower lobe. Her CBC and CMP from 3 months ago were completely normal.  She is able to perform more than 4 METS of activity without any shortness of breath or chest pain. HPI  Past Medical History:  Diagnosis Date  . Abnormal thyroid blood test   . Anemia   . Anxiety   . Asthma   . Complication of anesthesia    itching after c sections  . 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 Graves disease   . History of abnormal cervical Pap smear   . History of cervical polypectomy   . Hives 09/02/2015  . Hypertension   . IFG (impaired fasting glucose)   . Incidental lung nodule, > 52mm and < 47mm 03/31/2017   4 mm RLL lung nodule on chest CT Mar 31, 2017  . Low serum vitamin D   . Obesity   . Pregnancy induced hypertension    with 1st pregnancy, normal pressure with 2nd  . Reflux   . Thyroid disease   . Vitamin B12 deficiency   . Vitamin D deficiency disease   . Wears dentures    full upper    Past Surgical History:  Procedure Laterality Date  . CERVICAL POLYPECTOMY    . CESAREAN SECTION     x 2  . COLONOSCOPY WITH PROPOFOL N/A 11/14/2018   Procedure: COLONOSCOPY WITH PROPOFOL;  Surgeon: Pasty Spillers, MD;   Location: [facility];  Service: Endoscopy;  Laterality: N/A;  . ESOPHAGOGASTRODUODENOSCOPY (EGD) WITH PROPOFOL N/A 11/14/2018   Procedure: ESOPHAGOGASTRODUODENOSCOPY (EGD) WITH PROPOFOL;  Surgeon: Pasty Spillers, MD;  Location: [facility];  Service: Endoscopy;  Laterality: N/A;  Latex allergy  . GIVENS CAPSULE STUDY N/A 12/27/2018   Procedure: GIVENS CAPSULE STUDY;  Surgeon: Pasty Spillers, MD;  Location: [facility];  Service: Endoscopy;  Laterality: N/A;    Family History  Problem Relation Age of Onset  . Heart attack Mother 78  . Hypertension Mother   . Asthma Mother   . Cancer Mother        laryngeal  . Diabetes Mother        pre diabetic  . Heart disease Mother   . Mental illness Mother   . Alcohol abuse Mother   . Drug abuse Mother   . Depression Mother   . Anxiety disorder Mother   . Bipolar disorder Mother   . Learning disabilities Brother   . ADD / ADHD Brother   . Asthma Son   . Hyperlipidemia Brother   . Alcohol abuse Father   . Heart disease Maternal Grandmother  heart attack, pacemaker  . Heart attack Maternal Grandmother   . Hypertension Maternal Grandmother   . Hypertension Maternal Grandfather   . Heart disease Paternal Grandmother        couple of major open heart surgeries, leaking valves  . Hypertension Paternal Grandmother   . Hypertension Paternal Grandfather     Social History Social History   Tobacco Use  . Smoking status: Never Smoker  . Smokeless tobacco: Never Used  Substance Use Topics  . Alcohol use: No    Alcohol/week: 0.0 standard drinks  . Drug use: No    Allergies  Allergen Reactions  . Latex Hives    (Balloons, condoms, underwear elastic, gloves)  . Shellfish Allergy Hives    Current Outpatient Medications  Medication Sig Dispense Refill  . albuterol (PROVENTIL HFA;VENTOLIN HFA) 108 (90 Base) MCG/ACT inhaler Inhale 2 puffs into the lungs every 6 (six) hours as needed for wheezing or  shortness of breath. 18 g 1  . Cyanocobalamin (B-12) 1000 MCG SUBL Place 1 tablet under the tongue daily. 30 each 5  . Diclofenac Sodium 1 % CREA Apply 2 g topically 4 (four) times daily. 120 g 2  . Elastic Bandages & Supports (MEDICAL COMPRESSION STOCKINGS) MISC Wear from morning to night, but do not sleep in these 1 each 2  . Etonogestrel (NEXPLANON Adams) Inject into the skin.    . famotidine (PEPCID) 20 MG tablet Take 1 tablet (20 mg total) by mouth daily. 30 tablet 1  . fluticasone (FLONASE) 50 MCG/ACT nasal spray Place 2 sprays into both nostrils daily. 16 g 2  . hydrOXYzine (ATARAX/VISTARIL) 25 MG tablet Take 1 tablet (25 mg total) by mouth 3 (three) times daily as needed. 30 tablet 1  . ibuprofen (ADVIL) 800 MG tablet Take 1 tablet (800 mg total) by mouth every 8 (eight) hours as needed. 60 tablet 0  . levothyroxine (SYNTHROID) 50 MCG tablet Take 50 mcg by mouth daily.    . montelukast (SINGULAIR) 10 MG tablet Take 1 tablet (10 mg total) by mouth at bedtime. 90 tablet 1  . pregabalin (LYRICA) 75 MG capsule Take 1 capsule (75 mg total) by mouth 2 (two) times daily. 180 capsule 1  . traZODone (DESYREL) 50 MG tablet Take 0.5-1 tablets (25-50 mg total) by mouth at bedtime as needed for sleep. 60 tablet 0  . Vitamin D, Ergocalciferol, (DRISDOL) 1.25 MG (50000 UT) CAPS capsule Take 1 capsule (50,000 Units total) by mouth every 7 (seven) days. 12 capsule 1  . sulfamethoxazole-trimethoprim (BACTRIM DS) 800-160 MG tablet Take 1 tablet by mouth 2 (two) times daily for 10 days. 20 tablet 0   No current facility-administered medications for this visit.     Review of Systems Full ROS  was asked and was negative except for the information on the HPI  Physical Exam Blood pressure 132/88, pulse 87, temperature 98.1 F (36.7 C), resp. rate 13, height 5\' 3"  (1.6 m), weight 221 lb (100.2 kg), SpO2 99 %. CONSTITUTIONAL: NAD EYES: Pupils are equal, round, and reactive to light, Sclera are  non-icteric. EARS, NOSE, MOUTH AND THROAT:Wearing a mask. Hearing is intact to voice. LYMPH NODES:  Lymph nodes in the neck are normal. RESPIRATORY:  Lungs are clear. There is normal respiratory effort, with equal breath sounds bilaterally, and without pathologic use of accessory muscles. CARDIOVASCULAR: Heart is regular without murmurs, gallops, or rubs. BREAST: no palpable masses, no D/C, no skin changes. Right axilla with hidradenitis, tender , no abscess. Exam limited  due to pain. GI: The abdomen is  soft, nontender, and nondistended. There are no palpable masses. There is no hepatosplenomegaly. There are normal bowel sounds in all quadrants. GU: Rectal deferred.   MUSCULOSKELETAL: Normal muscle strength and tone. No cyanosis or edema.   SKIN: Turgor is good and there are no pathologic skin lesions or ulcers. NEUROLOGIC: Motor and sensation is grossly normal. Cranial nerves are grossly intact. PSYCH:  Oriented to person, place and time. Affect is normal.  Data Reviewed  I have personally reviewed the patient's imaging, laboratory findings and medical records.    Assessment/Plan 39 year old female with complex right axillary hidradenitis.  Given that the patient is still having symptoms I will add another 10 days of Bactrim DS.  Currently I do not see any definitive evidence of abscess or necrotizing infection.  She understands that she may require excision and may be able to heal by secondary intention.  Given the she does have axillary symptoms and she is 27 I will order a mammogram to make sure there is no evidence of any breast lesions. Time spent with the patient was 40 minutes, with more than 50% of the time spent in face-to-face education, counseling and care coordination.   Copy of this report was sent to the referring provider  Sterling Big, MD FACS General Surgeon 06/26/2019, 3:55 PM

## 2019-06-26 NOTE — Addendum Note (Signed)
Addended by: Sinda Du on: 06/26/2019 10:28 AM   Modules accepted: Orders

## 2019-06-27 ENCOUNTER — Ambulatory Visit (INDEPENDENT_AMBULATORY_CARE_PROVIDER_SITE_OTHER): Payer: Managed Care, Other (non HMO) | Admitting: Physician Assistant

## 2019-06-27 ENCOUNTER — Encounter: Payer: Self-pay | Admitting: Physician Assistant

## 2019-06-27 ENCOUNTER — Other Ambulatory Visit: Payer: Self-pay

## 2019-06-27 VITALS — BP 120/80 | HR 84 | Ht 63.0 in | Wt 219.5 lb

## 2019-06-27 DIAGNOSIS — R0789 Other chest pain: Secondary | ICD-10-CM | POA: Diagnosis not present

## 2019-06-27 DIAGNOSIS — R0602 Shortness of breath: Secondary | ICD-10-CM

## 2019-06-27 DIAGNOSIS — R002 Palpitations: Secondary | ICD-10-CM | POA: Diagnosis not present

## 2019-06-27 NOTE — Patient Instructions (Signed)
Medication Instructions:  Your physician recommends that you continue on your current medications as directed. Please refer to the Current Medication list given to you today.  *If you need a refill on your cardiac medications before your next appointment, please call your pharmacy*  Lab Work: None ordered  If you have labs (blood work) drawn today and your tests are completely normal, you will receive your results only by: Marland Kitchen MyChart Message (if you have MyChart) OR . A paper copy in the mail If you have any lab test that is abnormal or we need to change your treatment, we will call you to review the results.  Testing/Procedures: None ordered   Follow-Up: At [facility], you and your health needs are our priority.  As part of our continuing mission to provide you with exceptional heart care, we have created designated Provider Care Teams.  These Care Teams include your primary Cardiologist (physician) and Advanced Practice Providers (APPs -  Physician Assistants and Nurse Practitioners) who all work together to provide you with the care you need, when you need it.  Your next appointment:   PRN

## 2019-07-19 ENCOUNTER — Ambulatory Visit
Admission: RE | Admit: 2019-07-19 | Discharge: 2019-07-19 | Disposition: A | Discharge status: Home / Self Care | Payer: Managed Care, Other (non HMO) | Source: Ambulatory Visit | Attending: Surgery | Admitting: Surgery

## 2019-07-19 DIAGNOSIS — Z1231 Encounter for screening mammogram for malignant neoplasm of breast: Secondary | ICD-10-CM | POA: Insufficient documentation

## 2019-07-31 ENCOUNTER — Ambulatory Visit: Payer: Managed Care, Other (non HMO) | Admitting: Surgery

## 2019-08-14 ENCOUNTER — Encounter: Payer: Self-pay | Admitting: Family Medicine

## 2019-09-07 ENCOUNTER — Encounter: Payer: Self-pay | Admitting: Family Medicine

## 2019-09-12 ENCOUNTER — Encounter: Payer: Self-pay | Admitting: Family Medicine

## 2019-09-12 ENCOUNTER — Other Ambulatory Visit: Payer: Self-pay

## 2019-09-12 ENCOUNTER — Ambulatory Visit (INDEPENDENT_AMBULATORY_CARE_PROVIDER_SITE_OTHER): Payer: Managed Care, Other (non HMO) | Admitting: Family Medicine

## 2019-09-12 VITALS — BP 136/84 | HR 94 | Temp 97.5°F | Resp 14 | Ht 63.0 in | Wt 224.0 lb

## 2019-09-12 DIAGNOSIS — M5440 Lumbago with sciatica, unspecified side: Secondary | ICD-10-CM

## 2019-09-12 LAB — POCT URINALYSIS DIPSTICK
Bilirubin, UA: NEGATIVE
Blood, UA: NEGATIVE
Glucose, UA: NEGATIVE
Ketones, UA: NEGATIVE
Leukocytes, UA: NEGATIVE
Nitrite, UA: NEGATIVE
Odor: NORMAL
Protein, UA: NEGATIVE
Spec Grav, UA: 1.025 (ref 1.010–1.025)
Urobilinogen, UA: 0.2 E.U./dL
pH, UA: 5 (ref 5.0–8.0)

## 2019-09-12 MED ORDER — TIZANIDINE HCL 4 MG PO TABS: 2.0000 mg | ORAL_TABLET | Freq: Three times a day (TID) | ORAL | 2 refills | Status: DC | PRN

## 2019-09-12 MED ORDER — DEXAMETHASONE SODIUM PHOSPHATE 100 MG/10ML IJ SOLN
10.0000 mg | Freq: Once | INTRAMUSCULAR | Status: AC
  Administered 2019-09-12: 10 mg via INTRAMUSCULAR

## 2019-09-12 MED ORDER — TRAMADOL HCL 50 MG PO TABS: 50.0000 mg | ORAL_TABLET | Freq: Three times a day (TID) | ORAL | 0 refills | Status: AC | PRN

## 2019-09-12 MED ORDER — KETOROLAC TROMETHAMINE 60 MG/2ML IM SOLN
60.0000 mg | Freq: Once | INTRAMUSCULAR | Status: AC
  Administered 2019-09-12: 60 mg via INTRAMUSCULAR

## 2019-09-12 NOTE — Patient Instructions (Signed)
Heat Therapy Heat therapy can help ease sore, stiff, injured, and tight muscles and joints. Heat relaxes your muscles, which may help ease your pain and muscle spasms. Do not use heat therapy unless your doctor tells you to use it. How to use heat therapy There are several different kinds of heat therapy, including:  Moist heat pack.  Hot water bottle.  Electric heating pad.  Heated gel pack.  Heated wrap.  Warm water bath. Your doctor will tell you how to use heat therapy. In general, you should: 1. Place a towel between your skin and the heat source. 2. Leave the heat on for 20-30 minutes. Your skin may turn pink. 3. Remove the heat if your skin turns bright red. You should remove the heat source if you are unable to feel pain, heat, or cold. You are more likely to get burned if you leave it on the skin for too long. Your doctor may also tell you to take a warm water bath. To do this: 1. Put a non-slip pad in the bathtub to prevent a fall. 2. Fill the bathtub with warm water. 3. Check the water temperature. 4. Soak in the water for 15-20 minutes, or as told by your doctor. 5. Be careful when you stand up after the bath. You may feel dizzy. 6. Pat yourself dry after the bath. Do not rub your skin to dry it. General recommendations for heat therapy  Do not sleep while using heat therapy. Only use heat therapy while you are awake.  Your skin may turn pink while using heat therapy. Do not use heat therapy if your skin turns red.  Do not use heat therapy if you have a new injury.  High heat or using heat for a long time can cause burns. Be careful not to burn your skin when using heat therapy.  Do not use heat therapy on areas of your skin that are already irritated, such as with a rash or sunburn. Get help if you have:  Blisters, redness, swelling (puffiness), or numbness.  New pain.  Pain that is getting worse. Summary  Heat therapy is the use of heat to help ease sore,  stiff, injured, and tight muscles and joints.  There are different types of heat therapy. Your doctor will tell you which one to use.  Only use heat therapy while you are awake.  Watch your skin to make sure you do not get burned while using heat therapy. This information is not intended to replace advice given to you by your health care provider. Make sure you discuss any questions you have with your health care provider. Document Revised: 06/13/2018 Document Reviewed: 05/01/2017 Elsevier Patient Education  2020 [employer]    Sciatica  Sciatica is pain, weakness, tingling, or loss of feeling (numbness) along the sciatic nerve. The sciatic nerve starts in the lower back and goes down the back of each leg. Sciatica usually goes away on its own or with treatment. Sometimes, sciatica may come back (recur). What are the causes? This condition happens when the sciatic nerve is pinched or has pressure put on it. This may be the result of:  A disk in between the bones of the spine bulging out too far (herniated disk).  Changes in the spinal disks that occur with aging.  A condition that affects a muscle in the butt.  Extra bone growth near the sciatic nerve.  A break (fracture) of the area between your hip bones (pelvis).  Pregnancy.  Tumor. This is rare. What increases the risk? You are more likely to develop this condition if you:  Play sports that put pressure or stress on the spine.  Have poor strength and ease of movement (flexibility).  Have had a back injury in the past.  Have had back surgery.  Sit for long periods of time.  Do activities that involve bending or lifting over and over again.  Are very overweight (obese). What are the signs or symptoms? Symptoms can vary from mild to very bad. They may include:  Any of these problems in the lower back, leg, hip, or butt: ? Mild tingling, loss of feeling, or dull aches. ? Burning sensations. ? Sharp  pains.  Loss of feeling in the back of the calf or the sole of the foot.  Leg weakness.  Very bad back pain that makes it hard to move. These symptoms may get worse when you cough, sneeze, or laugh. They may also get worse when you sit or stand for long periods of time. How is this treated? This condition often gets better without any treatment. However, treatment may include:  Changing or cutting back on physical activity when you have pain.  Doing exercises and stretching.  Putting ice or heat on the affected area.  Medicines that help: ? To relieve pain and swelling. ? To relax your muscles.  Shots (injections) of medicines that help to relieve pain, irritation, and swelling.  Surgery. Follow these instructions at home: Medicines  Take over-the-counter and prescription medicines only as told by your doctor.  Ask your doctor if the medicine prescribed to you: ? Requires you to avoid driving or using heavy machinery. ? Can cause trouble pooping (constipation). You may need to take these steps to prevent or treat trouble pooping:  Drink enough fluids to keep your pee (urine) pale yellow.  Take over-the-counter or prescription medicines.  Eat foods that are high in fiber. These include beans, whole grains, and fresh fruits and vegetables.  Limit foods that are high in fat and sugar. These include fried or sweet foods. Managing pain      If told, put ice on the affected area. ? Put ice in a plastic bag. ? Place a towel between your skin and the bag. ? Leave the ice on for 20 minutes, 2-3 times a day.  If told, put heat on the affected area. Use the heat source that your doctor tells you to use, such as a moist heat pack or a heating pad. ? Place a towel between your skin and the heat source. ? Leave the heat on for 20-30 minutes. ? Remove the heat if your skin turns bright red. This is very important if you are unable to feel pain, heat, or cold. You may have a  greater risk of getting burned. Activity   Return to your normal activities as told by your doctor. Ask your doctor what activities are safe for you.  Avoid activities that make your symptoms worse.  Take short rests during the day. ? When you rest for a long time, do some physical activity or stretching between periods of rest. ? Avoid sitting for a long time without moving. Get up and move around at least one time each hour.  Exercise and stretch regularly, as told by your doctor.  Do not lift anything that is heavier than 10 lb (4.5 kg) while you have symptoms of sciatica. ? Avoid lifting heavy things even when you do not have  symptoms. ? Avoid lifting heavy things over and over.  When you lift objects, always lift in a way that is safe for your body. To do this, you should: ? Bend your knees. ? Keep the object close to your body. ? Avoid twisting. General instructions  Stay at a healthy weight.  Wear comfortable shoes that support your feet. Avoid wearing high heels.  Avoid sleeping on a mattress that is too soft or too hard. You might have less pain if you sleep on a mattress that is firm enough to support your back.  Keep all follow-up visits as told by your doctor. This is important. Contact a doctor if:  You have pain that: ? Wakes you up when you are sleeping. ? Gets worse when you lie down. ? Is worse than the pain you have had in the past. ? Lasts longer than 4 weeks.  You lose weight without trying. Get help right away if:  You cannot control when you pee (urinate) or poop (have a bowel movement).  You have weakness in any of these areas and it gets worse: ? Lower back. ? The area between your hip bones. ? Butt. ? Legs.  You have redness or swelling of your back.  You have a burning feeling when you pee. Summary  Sciatica is pain, weakness, tingling, or loss of feeling (numbness) along the sciatic nerve.  This condition happens when the sciatic  nerve is pinched or has pressure put on it.  Sciatica can cause pain, tingling, or loss of feeling (numbness) in the lower back, legs, hips, and butt.  Treatment often includes rest, exercise, medicines, and putting ice or heat on the affected area. This information is not intended to replace advice given to you by your health care provider. Make sure you discuss any questions you have with your health care provider. Document Revised: 05/09/2018 Document Reviewed: 05/09/2018 Elsevier Patient Education  [employer].    Piriformis Syndrome Rehab Ask your health care provider which exercises are safe for you. Do exercises exactly as told by your health care provider and adjust them as directed. It is normal to feel mild stretching, pulling, tightness, or discomfort as you do these exercises. Stop right away if you feel sudden pain or your pain gets worse. Do not begin these exercises until told by your health care provider. Stretching and range-of-motion exercises These exercises warm up your muscles and joints and improve the movement and flexibility of your hip and pelvis. The exercises also help to relieve pain, numbness, and tingling. Hip rotation This is an exercise in which you lie on your back and stretch the muscles that rotate your hip (hip rotators) to stretch your buttocks. 1. Lie on your back on a firm surface. 2. Pull your left / right knee toward your same shoulder with your left / right hand until your knee is pointing toward the ceiling. Hold your left / right ankle with your other hand. 3. Keeping your knee steady, gently pull your left / right ankle toward your other shoulder until you feel a stretch in your buttocks. 4. Hold this position for __________ seconds. Repeat __________ times. Complete this exercise __________ times a day. Hip extensor This is an exercise in which you lie on your back and pull your knee to your chest. 1. Lie on your back on a firm surface.  Both of your legs should be straight. 2. Pull your left / right knee to your chest. Hold your leg  in this position by holding onto the back of your thigh or the front of your knee. 3. Hold this position for __________ seconds. 4. Slowly return to the starting position. Repeat __________ times. Complete this exercise __________ times a day. Strengthening exercises These exercises build strength and endurance in your hip and thigh muscles. Endurance is the ability to use your muscles for a long time, even after they get tired. Straight leg raises, side-lying This exercise strengthens the muscles that rotate the leg at the hip and move it away from your body (hip abductors). 1. Lie on your side with your left / right leg in the top position. Lie so your head, shoulder, knee, and hip line up. Bend your bottom knee to help you balance. 2. Lift your top leg 4-6 inches (10-15 cm) while keeping your toes pointed straight ahead. 3. Hold this position for __________ seconds. 4. Slowly lower your leg to the starting position. 5. Let your muscles relax completely after each repetition. Repeat __________ times. Complete this exercise __________ times a day. Hip abduction and rotation This is sometimes called quadruped (on hands and knees) exercises. 1. Get on your hands and knees on a firm, lightly padded surface. Your hands should be directly below your shoulders, and your knees should be directly below your hips. 2. Lift your left / right knee out to the side. Keep your knee bent. Do not twist your body. 3. Hold this position for __________ seconds. 4. Slowly lower your leg. Repeat __________ times. Complete this exercise __________ times a day. Straight leg raises, face-down This exercise stretches the muscles that move your hips away from the front of the pelvis (hip extensors). 1. Lie on your abdomen on a bed or a firm surface with a pillow under your hips. 2. Squeeze your buttocks muscles and lift  your left / right leg about 4-6 inches (10-15 cm) off the bed. Do not let your back arch. 3. Hold this position for __________ seconds. 4. Slowly lower your leg to the starting position. 5. Let your muscles relax completely after each repetition. Repeat __________ times. Complete this exercise __________ times a day. This information is not intended to replace advice given to you by your health care provider. Make sure you discuss any questions you have with your health care provider. Document Revised: 08/11/2018 Document Reviewed: 02/10/2018 Elsevier Patient Education  2020 [employer].

## 2019-09-12 NOTE — Progress Notes (Signed)
Patient ID: Catherine Berg, female    DOB: 12-16-1980, 39 y.o.   MRN: [id number]  PCP: Catherine Cory, MD  Chief Complaint  Patient presents with  . Back Pain    onset 2 weeks    Subjective:   Catherine Berg is a 39 y.o. female, presents to clinic with CC of the following:  She first noticed low back pain 2 weeks ago, she noticed it after sitting for a while, pain would occur when trying to get up, now more constant and severe  Back Pain This is a new problem. The current episode started 1 to 4 weeks ago (gradual onset 2 weeks ago). The problem occurs constantly. The problem has been gradually worsening since onset. The pain is present in the sacro-iliac, gluteal and lumbar spine. Quality: "crimpled in a sense" sharp. Radiates to: only from low back SI joint to buttock, no radiation to legs. The pain is severe. The symptoms are aggravated by bending, sitting and standing. Stiffness is present all day. Associated symptoms include numbness (occasional). Pertinent negatives include no abdominal pain, bladder incontinence, bowel incontinence, chest pain, dysuria, fever, headaches, leg pain, paresthesias, weakness or weight loss. Risk factors include obesity, sedentary lifestyle and recent trauma (no hx of steroid use, osteoporosis, CA). She has tried NSAIDs and bed rest (tylenol) for the symptoms. The treatment provided no relief.    No abd pain, urinary sx, saddle anesthesia, incontinence of stool or urine Sx worsening -  800 mg ibuprofen 2-3 x a day, did not continue tylenol arthritis   Results for orders placed or performed in visit on 09/12/19  POCT urinalysis dipstick  Result Value Ref Range   Color, UA yellow    Clarity, UA clear    Glucose, UA Negative Negative   Bilirubin, UA neg    Ketones, UA neg    Spec Grav, UA 1.025 1.010 - 1.025   Blood, UA neg    pH, UA 5.0 5.0 - 8.0   Protein, UA Negative Negative   Urobilinogen, UA 0.2 0.2 or 1.0 E.U./dL   Nitrite, UA neg    Leukocytes, UA Negative Negative   Appearance clear    Odor normal    Urine unremarkable, no urinary sx     Patient Active Problem List   Diagnosis Date Noted  . Iron deficiency anemia   . Palpitations 09/16/2018  . Coronary artery calcification 09/16/2018  . 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 Bilateral leg edema 04/20/2017  . Incidental lung nodule, > 20mm and < 45mm 03/31/2017  . Hives 09/02/2015  . Morbid obesity (HCC) 08/15/2015  . Anxiety 07/09/2015  . 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 Thyroid nodule 02/01/2015  . Vitamin D deficiency disease   . Vitamin B12 deficiency   . History of abnormal cervical Pap smear   . Allergy-induced asthma, mild intermittent, uncomplicated 10/09/2013      Current Outpatient Medications:  .  albuterol (PROVENTIL HFA;VENTOLIN HFA) 108 (90 Base) MCG/ACT inhaler, Inhale 2 puffs into the lungs every 6 (six) hours as needed for wheezing or shortness of breath., Disp: 18 g, Rfl: 1 .  Cyanocobalamin (B-12) 1000 MCG SUBL, Place 1 tablet under the tongue daily., Disp: 30 each, Rfl: 5 .  Diclofenac Sodium 1 % CREA, Apply 2 g topically 4 (four) times daily., Disp: 120 g, Rfl: 2 .  Elastic Bandages & Supports (MEDICAL COMPRESSION STOCKINGS) MISC, Wear from morning to night, but do not sleep in these, Disp: 1 each, Rfl: 2 .  Etonogestrel (NEXPLANON [place]), Inject into the skin., Disp: , Rfl:  .  famotidine (PEPCID) 20 MG tablet, Take 1 tablet (20 mg total) by mouth daily., Disp: 30 tablet, Rfl: 1 .  fluticasone (FLONASE) 50 MCG/ACT nasal spray, Place 2 sprays into both nostrils daily., Disp: 16 g, Rfl: 2 .  hydrOXYzine (ATARAX/VISTARIL) 25 MG tablet, Take 1 tablet (25 mg total) by mouth 3 (three) times daily as needed., Disp: 30 tablet, Rfl: 1 .  ibuprofen (ADVIL) 800 MG tablet, Take 1 tablet (800 mg total) by mouth every 8 (eight) hours as needed., Disp: 60 tablet, Rfl: 0 .  levothyroxine (SYNTHROID) 50 MCG tablet, Take 50 mcg by mouth  daily., Disp: , Rfl:  .  montelukast (SINGULAIR) 10 MG tablet, Take 1 tablet (10 mg total) by mouth at bedtime., Disp: 90 tablet, Rfl: 1 .  pregabalin (LYRICA) 75 MG capsule, Take 1 capsule (75 mg total) by mouth 2 (two) times daily., Disp: 180 capsule, Rfl: 1 .  traZODone (DESYREL) 50 MG tablet, Take 0.5-1 tablets (25-50 mg total) by mouth at bedtime as needed for sleep., Disp: 60 tablet, Rfl: 0 .  Vitamin D, Ergocalciferol, (DRISDOL) 1.25 MG (50000 UT) CAPS capsule, Take 1 capsule (50,000 Units total) by mouth every 7 (seven) days., Disp: 12 capsule, Rfl: 1   Allergies  Allergen Reactions  . Latex Hives    (Balloons, condoms, underwear elastic, gloves)  . Shellfish Allergy Hives     Family History  Problem Relation Age of Onset  . Heart attack Mother 93  . Hypertension Mother   . Asthma Mother   . Cancer Mother        laryngeal  . Diabetes Mother        pre diabetic  . Heart disease Mother   . Mental illness Mother   . Alcohol abuse Mother   . Drug abuse Mother   . Depression Mother   . Anxiety disorder Mother   . Bipolar disorder Mother   . Learning disabilities Brother   . ADD / ADHD Brother   . Asthma Son   . Hyperlipidemia Brother   . Alcohol abuse Father   . Heart disease Maternal Grandmother        heart attack, pacemaker  . Heart attack Maternal Grandmother   . Hypertension Maternal Grandmother   . Hypertension Maternal Grandfather   . Heart disease Paternal Grandmother        couple of major open heart surgeries, leaking valves  . Hypertension Paternal Grandmother   . Hypertension Paternal Grandfather   . Breast cancer Neg Hx      Social History   Socioeconomic History  . Marital status: Single    Spouse name: Not on file  . Number of children: 2  . Years of education: Not on file  . Highest education level: High school graduate  Occupational History  . Not on file  Tobacco Use  . Smoking status: Never Smoker  . Smokeless tobacco: Never Used    Substance and Sexual Activity  . Alcohol use: No    Alcohol/week: 0.0 standard drinks  . Drug use: No  . Sexual activity: Yes    Partners: Male    Birth control/protection: None  Other Topics Concern  . Not on file  Social History Narrative  . Not on file   Social Determinants of Health   Financial Resource Strain:   . Difficulty of Paying Living Expenses:   Food Insecurity:   . Worried About Radiation protection practitioner  of Food in the Last Year:   . [place] in the Last Year:   Transportation Needs:   . Lack of Transportation (Medical):   Marland Kitchen Lack of Transportation (Non-Medical):   Physical Activity:   . Days of Exercise per Week:   . Minutes of Exercise per Session:   Stress:   . Feeling of Stress :   Social Connections:   . Frequency of Communication with Friends and Family:   . Frequency of Social Gatherings with Friends and Family:   . Attends Religious Services:   . Active Member of Clubs or Organizations:   . Attends Archivist Meetings:   Marland Kitchen Marital Status:   Intimate Partner Violence:   . Fear of Current or Ex-Partner:   . Emotionally Abused:   Marland Kitchen Physically Abused:   . Sexually Abused:     Chart Review Today: I personally reviewed active problem list, medication list, allergies, family history, social history, health maintenance, notes from last encounter, lab results, imaging with the patient/caregiver today.   Review of Systems  Constitutional: Negative.  Negative for fever and weight loss.  HENT: Negative.   Eyes: Negative.   Respiratory: Negative.   Cardiovascular: Negative.  Negative for chest pain.  Gastrointestinal: Negative.  Negative for abdominal pain and bowel incontinence.  Endocrine: Negative.   Genitourinary: Negative.  Negative for bladder incontinence and dysuria.  Musculoskeletal: Positive for back pain.  Skin: Negative.   Allergic/Immunologic: Negative.   Neurological: Positive for numbness (occasional). Negative for weakness,  headaches and paresthesias.  Hematological: Negative.   Psychiatric/Behavioral: Negative.   All other systems reviewed and are negative.      Objective:   Vitals:   09/12/19 1409  BP: 136/84  Pulse: 94  Resp: 14  Temp: (!) 97.5 F (36.4 C)  SpO2: 99%  Weight: 224 lb (101.6 kg)  Height: 5\' 3"  (1.6 m)    Body mass index is 39.68 kg/m.  Physical Exam Vitals and nursing note reviewed.  Constitutional:      Appearance: She is well-developed.  HENT:     Head: Normocephalic and atraumatic.  Eyes:     General:        Right eye: No discharge.        Left eye: No discharge.     Conjunctiva/sclera: Conjunctivae normal.  Neck:     Trachea: No tracheal deviation.  Cardiovascular:     Rate and Rhythm: Normal rate and regular rhythm.     Pulses: Normal pulses.     Heart sounds: Normal heart sounds.  Pulmonary:     Effort: Pulmonary effort is normal. No respiratory distress.     Breath sounds: No stridor.  Abdominal:     General: Bowel sounds are normal. There is no distension.     Palpations: Abdomen is soft.     Tenderness: There is no abdominal tenderness.  Musculoskeletal:     Comments: No midline tenderness from cervical to lumbar spine, no step off No paraspinal muscle ttp from cervical to lumbar spine Decreased ROM to back 5/5 strength bilaterally with dorsiflexion, plantarflexion, flexion and extension at knees, and flexion and extension at hips Grossly normal sensation to light touch to bilateral lower extremities +SLR   Skin:    General: Skin is warm and dry.     Findings: No rash.  Neurological:     Mental Status: She is alert.     Sensory: No sensory deficit.     Motor: No weakness or  abnormal muscle tone.     Coordination: Coordination normal.     Gait: Gait abnormal (antalgic).  Psychiatric:        Mood and Affect: Mood normal.        Behavior: Behavior normal.      Results for orders placed or performed in visit on 09/12/19  POCT urinalysis  dipstick  Result Value Ref Range   Color, UA yellow    Clarity, UA clear    Glucose, UA Negative Negative   Bilirubin, UA neg    Ketones, UA neg    Spec Grav, UA 1.025 1.010 - 1.025   Blood, UA neg    pH, UA 5.0 5.0 - 8.0   Protein, UA Negative Negative   Urobilinogen, UA 0.2 0.2 or 1.0 E.U./dL   Nitrite, UA neg    Leukocytes, UA Negative Negative   Appearance clear    Odor normal         Assessment & Plan:      ICD-10-CM   1. Acute midline low back pain with sciatica, sciatica laterality unspecified  M54.40 POCT urinalysis dipstick    tiZANidine (ZANAFLEX) 4 MG tablet    traMADol (ULTRAM) 50 MG tablet    Ambulatory referral to Physical Therapy    DG Lumbar Spine Complete    ketorolac (TORADOL) injection 60 mg    dexamethasone (DECADRON) injection 10 mg    Pt with acute back pain, gradual onset, no red flags.  Very stiff today in office with some visible pain with changing positions.   Discussed tx radiating pain/radiculopathy with steroids, but pt does not want prednisone, did accept toradol and decadron injection in clinic.  Tx with NSAIDs, muscle relaxers, heat therapy, gentle stretching Explained that xrays are mostly for screening PT will be the most effective tx she can do. Encouraged her to f/up with PCP or me in the next 2-3 weeks if not improving    Danelle Berry, PA-C 09/12/19 2:24 PM

## 2019-09-20 ENCOUNTER — Encounter: Payer: Self-pay | Admitting: *Deleted

## 2019-10-12 ENCOUNTER — Ambulatory Visit: Payer: Managed Care, Other (non HMO) | Admitting: Family Medicine

## 2019-10-16 ENCOUNTER — Ambulatory Visit: Payer: Managed Care, Other (non HMO) | Attending: Family Medicine | Admitting: Physical Therapy

## 2019-10-18 ENCOUNTER — Ambulatory Visit: Payer: Managed Care, Other (non HMO) | Admitting: Physical Therapy

## 2019-10-24 ENCOUNTER — Ambulatory Visit: Payer: Managed Care, Other (non HMO) | Admitting: Physical Therapy

## 2019-10-26 ENCOUNTER — Encounter: Payer: Managed Care, Other (non HMO) | Admitting: Physical Therapy

## 2019-10-31 ENCOUNTER — Encounter: Payer: Self-pay | Admitting: Family Medicine

## 2019-10-31 ENCOUNTER — Ambulatory Visit (INDEPENDENT_AMBULATORY_CARE_PROVIDER_SITE_OTHER): Payer: Managed Care, Other (non HMO) | Admitting: Family Medicine

## 2019-10-31 ENCOUNTER — Encounter: Payer: Managed Care, Other (non HMO) | Admitting: Physical Therapy

## 2019-10-31 ENCOUNTER — Other Ambulatory Visit: Payer: Self-pay

## 2019-10-31 DIAGNOSIS — J3089 Other allergic rhinitis: Secondary | ICD-10-CM

## 2019-10-31 DIAGNOSIS — G5793 Unspecified mononeuropathy of bilateral lower limbs: Secondary | ICD-10-CM | POA: Diagnosis not present

## 2019-10-31 DIAGNOSIS — F419 Anxiety disorder, unspecified: Secondary | ICD-10-CM

## 2019-10-31 DIAGNOSIS — N62 Hypertrophy of breast: Secondary | ICD-10-CM

## 2019-10-31 DIAGNOSIS — E890 Postprocedural hypothyroidism: Secondary | ICD-10-CM

## 2019-10-31 DIAGNOSIS — E559 Vitamin D deficiency, unspecified: Secondary | ICD-10-CM

## 2019-10-31 DIAGNOSIS — R0789 Other chest pain: Secondary | ICD-10-CM

## 2019-10-31 DIAGNOSIS — J45998 Other asthma: Secondary | ICD-10-CM

## 2019-10-31 DIAGNOSIS — L210 Seborrhea capitis: Secondary | ICD-10-CM

## 2019-10-31 DIAGNOSIS — E538 Deficiency of other specified B group vitamins: Secondary | ICD-10-CM

## 2019-10-31 DIAGNOSIS — Z8639 Personal history of other endocrine, nutritional and metabolic disease: Secondary | ICD-10-CM

## 2019-10-31 DIAGNOSIS — J302 Other seasonal allergic rhinitis: Secondary | ICD-10-CM

## 2019-10-31 DIAGNOSIS — G4709 Other insomnia: Secondary | ICD-10-CM

## 2019-10-31 LAB — COMPLETE METABOLIC PANEL WITH GFR
AG Ratio: 1.2 (calc) (ref 1.0–2.5)
ALT: 20 U/L (ref 6–29)
AST: 18 U/L (ref 10–30)
Albumin: 3.9 g/dL (ref 3.6–5.1)
Alkaline phosphatase (APISO): 60 U/L (ref 31–125)
BUN: 12 mg/dL (ref 7–25)
CO2: 25 mmol/L (ref 20–32)
Calcium: 9.3 mg/dL (ref 8.6–10.2)
Chloride: 104 mmol/L (ref 98–110)
Creat: 0.79 mg/dL (ref 0.50–1.10)
GFR, Est African American: 109 mL/min/{1.73_m2} (ref >=60)
GFR, Est Non African American: 94 mL/min/{1.73_m2} (ref >=60)
Globulin: 3.2 g/dL (calc) (ref 1.9–3.7)
Glucose, Bld: 95 mg/dL (ref 65–99)
Potassium: 4.2 mmol/L (ref 3.5–5.3)
Sodium: 136 mmol/L (ref 135–146)
Total Bilirubin: 0.4 mg/dL (ref 0.2–1.2)
Total Protein: 7.1 g/dL (ref 6.1–8.1)

## 2019-10-31 LAB — CBC WITH DIFFERENTIAL/PLATELET
Absolute Monocytes: 578 cells/uL (ref 200–950)
Basophils Absolute: 30 cells/uL (ref 0–200)
Basophils Relative: 0.4 %
Eosinophils Absolute: 150 cells/uL (ref 15–500)
Eosinophils Relative: 2 %
HCT: 37.8 % (ref 35.0–45.0)
Hemoglobin: 12.4 g/dL (ref 11.7–15.5)
Lymphs Abs: 2828 cells/uL (ref 850–3900)
MCH: 28.3 pg (ref 27.0–33.0)
MCHC: 32.8 g/dL (ref 32.0–36.0)
MCV: 86.3 fL (ref 80.0–100.0)
MPV: 9.8 fL (ref 7.5–12.5)
Monocytes Relative: 7.7 %
Neutro Abs: 3915 cells/uL (ref 1500–7800)
Neutrophils Relative %: 52.2 %
Platelets: 303 10*3/uL (ref 140–400)
RBC: 4.38 10*6/uL (ref 3.80–5.10)
RDW: 12.9 % (ref 11.0–15.0)
Total Lymphocyte: 37.7 %
WBC: 7.5 10*3/uL (ref 3.8–10.8)

## 2019-10-31 LAB — VITAMIN D 25 HYDROXY (VIT D DEFICIENCY, FRACTURES): Vit D, 25-Hydroxy: 17 ng/mL — ABNORMAL LOW (ref 30–100)

## 2019-10-31 LAB — TSH: TSH: 4 mIU/L

## 2019-10-31 LAB — VITAMIN B12: Vitamin B-12: 328 pg/mL (ref 200–1100)

## 2019-10-31 MED ORDER — CLOBETASOL PROPIONATE 0.05 % EX SOLN: 1.0000 "application " | Freq: Two times a day (BID) | CUTANEOUS | 0 refills | Status: DC

## 2019-10-31 MED ORDER — MONTELUKAST SODIUM 10 MG PO TABS: 10.0000 mg | ORAL_TABLET | Freq: Every day | ORAL | 1 refills | Status: DC

## 2019-10-31 MED ORDER — BUSPIRONE HCL 5 MG PO TABS: 5.0000 mg | ORAL_TABLET | Freq: Three times a day (TID) | ORAL | 0 refills | Status: DC | PRN

## 2019-10-31 NOTE — Progress Notes (Signed)
Name: Catherine Berg   MRN: [id number]    DOB: 07-13-1980   Date:10/31/2019       Progress Note  Subjective  Chief Complaint  Chief Complaint  Patient presents with  . Follow-up    HPI  History of iron deficiency anemia: she saw  Dr. Bonna Gains and had colonoscopy and endoscopy and had a capsule endoscopy all negative . She has Nexplanon and has not had a period in years. We will recheck level since she has been feeling more fatigued   Asthma Mild intermittent: she is under the care of Dr. Felicie Morn and states no longer taking taking Symbicort, only singulair but has noticed increase in sob with minimal activity ( negative myoview done by cardiologist) , discussed follow up with pulmonologist to discuss other options.   Vitamin D and B12 deficiency: taking supplementation, she has neuropathy and is taking Lyrica she states it seems to help with symptoms. She states no longer has constant pain, pain is now intermittent and when present pain it can go up to 8/10, usually worse after she has been more active   History of depression: around the time her mother died in Aug 04, 2014 , she was on medications for a period of times, she still has some anxiety and takes hydroxizine prn, she states she feels sleepy with hydroxyzine so she usually takes only prn , discussed other options and we will try buspar She states she has been upset over the past couple of weeks because of her increase in weight.   Insomnia: she falls asleep but wakes up in the middle of the night and cannot fall back asleep, we discussed trazodone and is helping her fall and stay asleep   Morbid Obesity: she states gained weight at age 65 when she started depo and also ocp's afterwards, heaviest weight is now, she gained 1a few pounds since last visit, she has not been as compliant with her diet and exercise.   CRP: we will recheck next visit since not due for labs. She has intermittent arthralgia and elevated CRP seen by Dr. Meda Coffee  in the past . Feels stiff in the mornings for at least one hour. It even affects her writing . She does not want to go back, but willing to see the provider who will replace her this Fall   Large breast: she states indentation under bra straps, makes her shoulders ache and is thinking again about breast reduction surgery . She wears 67 D, she has been very dissatisfied with her breast, tired of having large breasts  Hypothyroidism:s/p ablation  she is taking levothyroxine , we will recheck TSH, monitored by Dr. Honor Junes , last TSH was done Nov 2020   Fatigue: she states that for the past 2 weeks she has been feeling more tired. She states that she has intermittent sob with activity, she is feeling like napping all the time  Hoarseness: she states developed about one week ago, she denies rhinorrhea, nasal congestion or cough, discussed referral to ENT if over 6 weeks.   White material from scalp: she has some dry spots in her scalp, she scratches, washes hair once every two weeks or so   Patient Active Problem List   Diagnosis Date Noted  . Arthralgia of hand 01/24/2019  . CRP elevated 01/24/2019  . ESR raised 01/24/2019  . Class 3 severe obesity due to excess calories without serious comorbidity in adult ([place]) 01/24/2019  . Iron deficiency anemia   . Palpitations 09/16/2018  .  Coronary artery calcification 09/16/2018  . 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 Bilateral leg edema 04/20/2017  . Incidental lung nodule, > 57m and < 858m11/28/2018  . Hives 09/02/2015  . Morbid obesity ([facility]04/13/2017  . Anxiety 07/09/2015  . 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 Thyroid nodule 02/01/2015  . Vitamin D deficiency disease   . Vitamin B12 deficiency   . History of abnormal cervical Pap smear   . Allergy-induced asthma, mild intermittent, uncomplicated 0674/12/1446  Past Surgical History:  Procedure Laterality Date  . CERVICAL POLYPECTOMY    . CESAREAN SECTION     x 2  .  COLONOSCOPY WITH PROPOFOL N/A 11/14/2018   Procedure: COLONOSCOPY WITH PROPOFOL;  Surgeon: TaVirgel ManifoldMD;  [place] [place];  Laterality: N/A;  . ESOPHAGOGASTRODUODENOSCOPY (EGD) WITH PROPOFOL N/A 11/14/2018   Procedure: ESOPHAGOGASTRODUODENOSCOPY (EGD) WITH PROPOFOL;  Surgeon: TaVirgel ManifoldMD;  [place] [facility] [facility];  Laterality: N/A;  Latex allergy  . GIVENS CAPSULE STUDY N/A 12/27/2018   Procedure: GIVENS CAPSULE STUDY;  Surgeon: TaVirgel ManifoldMD;  Location: [facility] ENDOSCOPY;  Service: Endoscopy;  Laterality: N/A;    Family History  Problem Relation Age of Onset  . Heart attack Mother 4766. Hypertension Mother   . Asthma Mother   . Cancer Mother        laryngeal  . Diabetes Mother        pre diabetic  . Heart disease Mother   . Mental illness Mother   . Alcohol abuse Mother   . Drug abuse Mother   . Depression Mother   . Anxiety disorder Mother   . Bipolar disorder Mother   . Learning disabilities Brother   . ADD / ADHD Brother   . Asthma Son   . Hyperlipidemia Brother   . Alcohol abuse Father   . Heart disease Maternal Grandmother        heart attack, pacemaker  . Heart attack Maternal Grandmother   . Hypertension Maternal Grandmother   . Hypertension Maternal Grandfather   . Heart disease Paternal Grandmother        couple of major open heart surgeries, leaking valves  . Hypertension Paternal Grandmother   . Hypertension Paternal Grandfather   . Breast cancer Neg Hx     Social History   Tobacco Use  . Smoking status: Never Smoker  . Smokeless tobacco: Never Used  Substance Use Topics  . Alcohol use: No    Alcohol/week: 0.0 standard drinks     Current Outpatient Medications:  .  albuterol (PROVENTIL HFA;VENTOLIN HFA) 108 (90 Base) MCG/ACT inhaler, Inhale 2 puffs into the lungs every 6 (six) hours as needed for wheezing or shortness of breath., Disp: 18 g, Rfl: 1 .   Cyanocobalamin (B-12) 1000 MCG SUBL, Place 1 tablet under the tongue daily., Disp: 30 each, Rfl: 5 .  Elastic Bandages & Supports (MEDICAL COMPRESSION STOCKINGS) MISC, Wear from morning to night, but do not sleep in these, Disp: 1 each, Rfl: 2 .  Etonogestrel (NEXPLANON [place]), Inject into the skin., Disp: , Rfl:  .  famotidine (PEPCID) 20 MG tablet, Take 1 tablet (20 mg total) by mouth daily., Disp: 30 tablet, Rfl: 1 .  fluticasone (FLONASE) 50 MCG/ACT nasal spray, Place 2 sprays into both nostrils daily., Disp: 16 g, Rfl: 2 .  hydrOXYzine (ATARAX/VISTARIL) 25 MG tablet, Take 1 tablet (25 mg total) by mouth 3 (three) times daily as needed.,  Disp: 30 tablet, Rfl: 1 .  ibuprofen (ADVIL) 800 MG tablet, Take 1 tablet (800 mg total) by mouth every 8 (eight) hours as needed., Disp: 60 tablet, Rfl: 0 .  levothyroxine (SYNTHROID) 50 MCG tablet, Take 50 mcg by mouth daily., Disp: , Rfl:  .  montelukast (SINGULAIR) 10 MG tablet, Take 1 tablet (10 mg total) by mouth at bedtime., Disp: 90 tablet, Rfl: 1 .  pregabalin (LYRICA) 75 MG capsule, Take 1 capsule (75 mg total) by mouth 2 (two) times daily., Disp: 180 capsule, Rfl: 1 .  tiZANidine (ZANAFLEX) 4 MG tablet, Take 0.5-1.5 tablets (2-6 mg total) by mouth every 8 (eight) hours as needed for muscle spasms (muscle tightness)., Disp: 90 tablet, Rfl: 2 .  traZODone (DESYREL) 50 MG tablet, Take 0.5-1 tablets (25-50 mg total) by mouth at bedtime as needed for sleep., Disp: 60 tablet, Rfl: 0 .  Vitamin D, Ergocalciferol, (DRISDOL) 1.25 MG (50000 UT) CAPS capsule, Take 1 capsule (50,000 Units total) by mouth every 7 (seven) days., Disp: 12 capsule, Rfl: 1 .  Diclofenac Sodium 1 % CREA, Apply 2 g topically 4 (four) times daily. (Patient not taking: Reported on 10/31/2019), Disp: 120 g, Rfl: 2  Allergies  Allergen Reactions  . Latex Hives    (Balloons, condoms, underwear elastic, gloves)  . Shellfish Allergy Hives    I personally reviewed active problem list,  medication list, allergies, family history, social history, health maintenance with the patient/caregiver today.   ROS  Constitutional: Negative for fever or weight change.  Respiratory: Negative for cough and shortness of breath.   Cardiovascular: Negative for chest pain or palpitations.  Gastrointestinal: Negative for abdominal pain, no bowel changes.  Musculoskeletal: Negative for gait problem or joint swelling.  Skin: Negative for rash.  Neurological: Negative for dizziness or headache.  No other specific complaints in a complete review of systems (except as listed in HPI above).  Objective  Vitals:   10/31/19 1434  BP: 132/78  Pulse: 92  Resp: 18  Temp: 98.3 F (36.8 C)  TempSrc: Temporal  SpO2: 99%  Weight: 227 lb 11.2 oz (103.3 kg)  Height: '5\' 3"'$  (1.6 m)    Body mass index is 40.34 kg/m.  Physical Exam  Constitutional: Patient appears well-developed and well-nourished. Obese  No distress.  HEENT: head atraumatic, normocephalic, pupils equal and reactive to light, neck supple, throat within normal limits Cardiovascular: Normal rate, regular rhythm and normal heart sounds.  No murmur heard. No BLE edema. Pulmonary/Chest: Effort normal and breath sounds normal. No respiratory distress. Abdominal: Soft.  There is no tenderness. Scalp: spot on top of her scalp, white material.  Psychiatric: Patient has a normal mood and affect. behavior is normal. Judgment and thought content normal.  Recent Results (from the past 2160 hour(s))  POCT urinalysis dipstick     Status: Normal   Collection Time: 09/12/19  2:16 PM  Result Value Ref Range   Color, UA yellow    Clarity, UA clear    Glucose, UA Negative Negative   Bilirubin, UA neg    Ketones, UA neg    Spec Grav, UA 1.025 1.010 - 1.025   Blood, UA neg    pH, UA 5.0 5.0 - 8.0   Protein, UA Negative Negative   Urobilinogen, UA 0.2 0.2 or 1.0 E.U./dL   Nitrite, UA neg    Leukocytes, UA Negative Negative   Appearance  clear    Odor normal      PHQ2/9: Depression screen [facility] 2/9  10/31/2019 10/31/2019 09/12/2019 06/22/2019 06/16/2019  Decreased Interest 1 1 0 0 0  Down, Depressed, Hopeless _0 0 0  PHQ - 2 Score _1 0 0  Altered sleeping 1 1 0 1 0  Tired, decreased energy 1 1 0 0 0  Change in appetite 1 1 0 0 0  Feeling bad or failure about yourself  1 1 0 0 0  Trouble concentrating 0 0 0 0 0  Moving slowly or fidgety/restless 0 0 0 0 0  Suicidal thoughts 0 0 0 0 0  PHQ-9 Score _2 0  Difficult doing work/chores Somewhat difficult Somewhat difficult Not difficult at all Not difficult at all Not difficult at all  Some recent data might be hidden    phq 9 is positive  Fall Risk: Fall Risk  10/31/2019 09/12/2019 06/26/2019 06/26/2019 06/22/2019  Falls in the past year? 0 0 0 0 0  Number falls in past yr: 0 0 0 0 0  Injury with Fall? 0 0 0 0 0  Comment - - - - -     Functional Status Survey: Is the patient deaf or have difficulty hearing?: No Does the patient have difficulty seeing, even when wearing glasses/contacts?: No Does the patient have difficulty concentrating, remembering, or making decisions?: No Does the patient have difficulty walking or climbing stairs?: No Does the patient have difficulty dressing or bathing?: No Does the patient have difficulty doing errands alone such as visiting a doctor's office or shopping?: No    Assessment & Plan  1. Morbid obesity (HCC)  - COMPLETE METABOLIC PANEL WITH GFR  2. Neuropathy involving both lower extremities  Stable on medication, just taking it prn, explained it will work better with daily use, can take it only at night if she prefers   3. Vitamin D deficiency disease  - VITAMIN D 25 Hydroxy (Vit-D Deficiency, Fractures)  4. Perennial allergic rhinitis with seasonal variation  - montelukast (SINGULAIR) 10 MG tablet; Take 1 tablet (10 mg total) by mouth at bedtime.  Dispense: 90 tablet; Refill: 1  5. Other insomnia   6. Large  breasts  - Ambulatory referral to Plastic Surgery  7. Vitamin B12 deficiency  - Vitamin B12  8. History of iron deficiency  - CBC with Differential/Platelet  9. Postablative hypothyroidism  - TSH  10. Atypical chest pain  Improved, seen by cardiologist   11. History of iron deficiency anemia   12. Allergy-induced asthma, mild intermittent, uncomplicated  - montelukast (SINGULAIR) 10 MG tablet; Take 1 tablet (10 mg total) by mouth at bedtime.  Dispense: 90 tablet; Refill: 1  13. Anxiety  - busPIRone (BUSPAR) 5 MG tablet; Take 1 tablet (5 mg total) by mouth 3 (three) times daily as needed.  Dispense: 90 tablet; Refill:   14. Seborrhea capitis  - clobetasol (TEMOVATE) 0.05 % external solution; Apply 1 application topically 2 (two) times daily.  Dispense: 50 mL; Refill: 0

## 2019-11-07 ENCOUNTER — Ambulatory Visit: Payer: Managed Care, Other (non HMO) | Admitting: Family Medicine

## 2019-11-13 ENCOUNTER — Ambulatory Visit: Payer: Managed Care, Other (non HMO) | Admitting: Family Medicine

## 2019-11-29 ENCOUNTER — Encounter: Payer: Self-pay | Admitting: Family Medicine

## 2020-01-03 DIAGNOSIS — U071 COVID-19: Secondary | ICD-10-CM

## 2020-01-03 HISTORY — DX: COVID-19: U07.1

## 2020-01-04 ENCOUNTER — Institutional Professional Consult (permissible substitution): Payer: Managed Care, Other (non HMO) | Admitting: Plastic Surgery

## 2020-01-10 ENCOUNTER — Encounter: Payer: Self-pay | Admitting: Family Medicine

## 2020-01-10 ENCOUNTER — Telehealth (INDEPENDENT_AMBULATORY_CARE_PROVIDER_SITE_OTHER): Payer: Managed Care, Other (non HMO) | Admitting: Family Medicine

## 2020-01-10 ENCOUNTER — Other Ambulatory Visit: Payer: Self-pay

## 2020-01-10 VITALS — Ht 63.0 in | Wt 220.0 lb

## 2020-01-10 DIAGNOSIS — J45998 Other asthma: Secondary | ICD-10-CM

## 2020-01-10 DIAGNOSIS — U071 COVID-19: Secondary | ICD-10-CM

## 2020-01-10 DIAGNOSIS — J069 Acute upper respiratory infection, unspecified: Secondary | ICD-10-CM | POA: Diagnosis not present

## 2020-01-10 MED ORDER — PREDNISONE 20 MG PO TABS: 40.0000 mg | ORAL_TABLET | Freq: Every day | ORAL | 0 refills | Status: AC

## 2020-01-10 NOTE — Progress Notes (Signed)
Name: Catherine Berg   MRN: [id number]    DOB: 1981-03-20   Date:01/10/2020       Progress Note  Subjective:    Chief Complaint  Chief Complaint  Patient presents with  . Covid Positive    Dx today, all of family has. Needs work note. Symptoms include: very little sob, runny nose, cough and body aches    I connected with  Catherine Berg  on 01/10/20 at  3:40 PM EDT by a video enabled telemedicine application and verified that I am speaking with the correct person using two identifiers.  I discussed the limitations of evaluation and management by telemedicine and the availability of in person appointments. The patient expressed understanding and agreed to proceed. Staff also discussed with the patient that there may be a patient responsible charge related to this service. Patient Location: home Provider Location: [facility] Additional Individuals present: none  HPI  Pt is COVID + Sx onset yesterday 01/09/2020 Many family members are ill and also positive (one of her children was negative) Current Sx include URI sx, cough, body aches, She had some back pain and leg pain, both not new for her, but worse- back pain did subside with OTC meds - tylenol 650 mg, leg pain still bothersome.  No swelling or skin changes.  She has cough but denies sweats, CP, SOB, severe fatigue. She has hx of asthma, hasn't used her inhaler. She has 123/65 HR 90   Patient Active Problem List   Diagnosis Date Noted  . Arthralgia of hand 01/24/2019  . CRP elevated 01/24/2019  . ESR raised 01/24/2019  . Class 3 severe obesity due to excess calories without serious comorbidity in adult ([place]) 01/24/2019  . Iron deficiency anemia   . Palpitations 09/16/2018  . Coronary artery calcification 09/16/2018  . 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 Bilateral leg edema 04/20/2017  . Incidental lung nodule, > 77m and < 843m11/28/2018  . Hives 09/02/2015  . Morbid obesity ([facility]04/13/2017  . Anxiety  07/09/2015  . 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 Thyroid nodule 02/01/2015  . Vitamin D deficiency disease   . Vitamin B12 deficiency   . History of abnormal cervical Pap smear   . Allergy-induced asthma, mild intermittent, uncomplicated 0622/29/7989  Social History   Tobacco Use  . Smoking status: Never Smoker  . Smokeless tobacco: Never Used  Substance Use Topics  . Alcohol use: No    Alcohol/week: 0.0 standard drinks     Current Outpatient Medications:  .  albuterol (PROVENTIL HFA;VENTOLIN HFA) 108 (90 Base) MCG/ACT inhaler, Inhale 2 puffs into the lungs every 6 (six) hours as needed for wheezing or shortness of breath., Disp: 18 g, Rfl: 1 .  busPIRone (BUSPAR) 5 MG tablet, Take 1 tablet (5 mg total) by mouth 3 (three) times daily as needed., Disp: 90 tablet, Rfl: 0 .  clobetasol (TEMOVATE) 0.05 % external solution, Apply 1 application topically 2 (two) times daily., Disp: 50 mL, Rfl: 0 .  Cyanocobalamin (B-12) 1000 MCG SUBL, Place 1 tablet under the tongue daily., Disp: 30 each, Rfl: 5 .  Etonogestrel (NEXPLANON [place]), Inject into the skin., Disp: , Rfl:  .  famotidine (PEPCID) 20 MG tablet, Take 1 tablet (20 mg total) by mouth daily., Disp: 30 tablet, Rfl: 1 .  fluticasone (FLONASE) 50 MCG/ACT nasal spray, Place 2 sprays into both nostrils daily., Disp: 16 g, Rfl: 2 .  hydrOXYzine (ATARAX/VISTARIL) 25 MG tablet, Take 1 tablet (  25 mg total) by mouth 3 (three) times daily as needed., Disp: 30 tablet, Rfl: 1 .  ibuprofen (ADVIL) 800 MG tablet, Take 1 tablet (800 mg total) by mouth every 8 (eight) hours as needed., Disp: 60 tablet, Rfl: 0 .  levothyroxine (SYNTHROID) 50 MCG tablet, Take 50 mcg by mouth daily., Disp: , Rfl:  .  montelukast (SINGULAIR) 10 MG tablet, Take 1 tablet (10 mg total) by mouth at bedtime., Disp: 90 tablet, Rfl: 1 .  pregabalin (LYRICA) 75 MG capsule, Take 1 capsule (75 mg total) by mouth 2 (two) times daily., Disp: 180 capsule, Rfl: 1 .  tiZANidine  (ZANAFLEX) 4 MG tablet, Take 0.5-1.5 tablets (2-6 mg total) by mouth every 8 (eight) hours as needed for muscle spasms (muscle tightness)., Disp: 90 tablet, Rfl: 2 .  traZODone (DESYREL) 50 MG tablet, Take 0.5-1 tablets (25-50 mg total) by mouth at bedtime as needed for sleep., Disp: 60 tablet, Rfl: 0 .  Vitamin D, Ergocalciferol, (DRISDOL) 1.25 MG (50000 UT) CAPS capsule, Take 1 capsule (50,000 Units total) by mouth every 7 (seven) days., Disp: 12 capsule, Rfl: 1 .  Elastic Bandages & Supports (MEDICAL COMPRESSION STOCKINGS) MISC, Wear from morning to night, but do not sleep in these, Disp: 1 each, Rfl: 2 .  predniSONE (DELTASONE) 20 MG tablet, Take 2 tablets (40 mg total) by mouth daily with breakfast for 5 days., Disp: 10 tablet, Rfl: 0  Allergies  Allergen Reactions  . Latex Hives    (Balloons, condoms, underwear elastic, gloves)  . Shellfish Allergy Hives    I personally reviewed active problem list, medication list, allergies, family history, social history, health maintenance, notes from last encounter, lab results, imaging with the patient/caregiver today.   Review of Systems  10 Systems reviewed and are negative for acute change except as noted in the HPI.   Objective:   Virtual encounter, vitals limited, only able to obtain the following Today's Vitals   01/10/20 1446  Weight: 220 lb (99.8 kg)  Height: _0  (1.6 m)   Body mass index is 38.97 kg/m. Nursing Note and Vital Signs reviewed.  Physical Exam Vitals and nursing note reviewed.  Pulmonary:     Effort: No respiratory distress.     Comments: occassional cough, no audible respiratory distress, wheeze or stridor, phonation clear Neurological:     Mental Status: She is alert.  Psychiatric:        Mood and Affect: Mood normal.     PE limited by telephone encounter  No results found for this or any previous visit (from the past 72 hour(s)).  Assessment and Plan:     ICD-10-CM   1. Upper respiratory tract  infection due to COVID-19 virus  U07.1    J06.9    pt tested positive yesterday, sx onset 01/09/2020, isolate until 01/19/2020, reviewed isolation, supportive tx, work note given  2. Asthma, persistent not controlled  J45.998 predniSONE (DELTASONE) 20 MG tablet   steroid burst prescribed for asthma exacerbation     Isolate until 01/19/2020 - would be cleared to RTW after that if sx improving and fever free- explained letter void if pt is febrile or symptomatic at that time and she needs f/up appt for reassessment  Patient was advised to isolate as follows following positive COVID-19 result:  At least 10 days have passed since symptom onset and At least 24 hours have passed since resolution of fever without the use of fever-reducing medications and Other symptoms have improved.  Advised of  CDC guidelines for self isolation/ ending isolation.  Advised of safe practice guidelines.    Encouraged to monitor for any worsening symptoms; watch for increased shortness of breath, weakness, and signs of dehydration. Advised when to seek emergency care.  Instructed to rest and hydrate well.  Advised to leave the house during recommended isolation period, only if it is necessary to seek medical care   - I discussed the assessment and treatment plan with the patient. The patient was provided an opportunity to ask questions and all were answered. The patient agreed with the plan and demonstrated an understanding of the instructions.  I provided 20+ minutes of non-face-to-face time during this encounter.  Delsa Grana, PA-C 01/10/20 4:19 PM

## 2020-01-10 NOTE — Patient Instructions (Addendum)
[email or web address]  What to Do if You Are Sick Stay home for 72 hours after symptoms resolve except to get medical care.  Separate yourself from other people and animals in the home.  Call ahead before visiting a doctor.  Wear a facemask (only if sick and around other people).  Cover your coughs and sneezes.  Avoid sharing personal household items.  Clean your hands often.  Clean all "high-touch" surfaces every day.  Monitor your symptoms.  Seek prompt medical attention if your illness is worsening. (e.g. difficulty breathing)  Before seeking care , call your healthcare provider to let them know you have or are being evaluated for COVID-19.    When to seek emergency medical attention Look for emergency warning signs* for COVID-19. If someone is showing any of these signs, seek emergency medical care immediately:   Trouble breathing  Persistent pain or pressure in the chest  New confusion  Inability to wake or stay awake  Pale, gray, or blue-colored skin, lips, or nail beds, depending on skin tone *This list is not all possible symptoms. Please call your medical provider for any other symptoms that are severe or concerning to you.  Call 911 or call ahead to your local emergency facility: Notify the operator that you are seeking care for someone who has or may have COVID-19.   COVID-19: Quarantine vs. Isolation  QUARANTINE keeps someone who was in close contact with someone who has COVID-19 away from others. If you had close contact with a person who has COVID-19  Stay home until 14 days after your last contact.  (for family members - their quarantine will start on COVID positive famiily members LAST day of isolation- so it can be a long time - recommend retesting if neg family members develop any symptoms, so they can also isolate and then return to school/work etc sooner)  Check your temperature twice a day and watch for  symptoms of COVID-19.  If possible, stay away from people who are at higher-risk for getting very sick from COVID-19.  ISOLATION keeps someone who is sick or tested positive for COVID-19 without symptoms away from others, even in their own home. If you are sick and think or know you have COVID-19  Stay home until after ? At least 10 days since symptoms first appeared and ? At least 24 hours with no fever without fever-reducing medication and ? Symptoms have improved  If you tested positive for COVID-19 but do not have symptoms  Stay home until after ? 10 days have passed since your positive test If you live with others, stay in a specific "sick room" or area and away from other people or animals, including pets. Use a separate bathroom, if available. [email or web address] 11/21/2018 This information is not intended to replace advice given to you by your health care provider. Make sure you discuss any questions you have with your health care provider. Document Revised: 04/06/2019 Document Reviewed: 04/06/2019 Elsevier Patient Education  2020 [employer].   [email or web address]  [email or web address]

## 2020-01-18 ENCOUNTER — Encounter: Payer: Self-pay | Admitting: Family Medicine

## 2020-01-18 NOTE — Telephone Encounter (Signed)
Need f/up appt to reassess

## 2020-01-23 ENCOUNTER — Telehealth (INDEPENDENT_AMBULATORY_CARE_PROVIDER_SITE_OTHER): Payer: Managed Care, Other (non HMO) | Admitting: Internal Medicine

## 2020-01-23 ENCOUNTER — Encounter: Payer: Self-pay | Admitting: Internal Medicine

## 2020-01-23 DIAGNOSIS — U071 COVID-19: Secondary | ICD-10-CM | POA: Insufficient documentation

## 2020-01-23 DIAGNOSIS — R5383 Other fatigue: Secondary | ICD-10-CM

## 2020-01-23 NOTE — Progress Notes (Signed)
Name: Catherine Berg   MRN: [id number]    DOB: [birth year]   Date:01/23/2020       Progress Note  Subjective  Chief Complaint  Chief Complaint  Patient presents with  . Covid Positive    Tested positive 9/8.21  . Cough    white phlegm  . Fatigue    some heavy breathing with activity, very tired  . Diarrhea    has improved    I connected with  Catherine Berg on 01/23/20 at  1:20 PM EDT by telephone and verified that I am speaking with the correct person using two identifiers.  I discussed the limitations, risks, security and privacy concerns of performing an evaluation and management service by telephone and the availability of in person appointments. The patient expressed understanding and agreed to proceed. Staff also discussed with the patient that there may be a patient responsible charge related to this service. Patient Location: Home Provider Location: [facility] Additional Individuals present: none  HPI  Patient is a 39 year old female patient of [name] Tested Covid + 01/10/2020 Follows up by phone visit today  Notes feels somewhat better, and some symptoms persist and include: + cough, min production and only when coughs for longer period of time, whitish phlegm, no blood No marked SOB at rest, + SOB when doing things No fever,  No sore throat and mild head congestion + loss of smell, loss of taste No N/V + fatigued and decreased energy levels  muscle aches are better Min loose stools intermittently/no diarrhea No CP,  passing out episodes Currently taking mucinex cold and flu, tried others not helping including advil, using albuterol about once daily  Comorbid conditions reviewed  Positive allergy induced asthma, not use every day inhaler No h/o DM, heart disease,   +morbid obesity Tobacco-never smoker  Was supposed to RTW yesterday but did not. Requests a note.  Patient Active Problem List   Diagnosis Date Noted  . Arthralgia of hand 01/24/2019  . CRP  elevated 01/24/2019  . ESR raised 01/24/2019  . Class 3 severe obesity due to excess calories without serious comorbidity in adult ([place]) 01/24/2019  . Iron deficiency anemia   . Palpitations 09/16/2018  . Coronary artery calcification 09/16/2018  . 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 Bilateral leg edema 04/20/2017  . Incidental lung nodule, > 90m and < 854m11/28/2018  . Hives 09/02/2015  . Morbid obesity ([place]04/13/2017  . Anxiety 07/09/2015  . 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 Thyroid nodule 02/01/2015  . Vitamin D deficiency disease   . Vitamin B12 deficiency   . History of abnormal cervical Pap smear   . Allergy-induced asthma, mild intermittent, uncomplicated 0678/29/5621  Past Surgical History:  Procedure Laterality Date  . CERVICAL POLYPECTOMY    . CESAREAN SECTION     x 2  . COLONOSCOPY WITH PROPOFOL N/A 11/14/2018   Procedure: COLONOSCOPY WITH PROPOFOL;  Surgeon: TaVirgel ManifoldMD;  [place] [facility] Service[facility];  Laterality: N/A;  . ESOPHAGOGASTRODUODENOSCOPY (EGD) WITH PROPOFOL N/A 11/14/2018   Procedure: ESOPHAGOGASTRODUODENOSCOPY (EGD) WITH PROPOFOL;  Surgeon: TaVirgel ManifoldMD;  [place] Service: [facility];  Laterality: N/A;  Latex allergy  . GIVENS CAPSULE STUDY N/A 12/27/2018   Procedure: GIVENS CAPSULE STUDY;  Surgeon: TaVirgel ManifoldMD;  Location: [facility] ENDOSCOPY;  Service: Endoscopy;  Laterality: N/A;    Family History  Problem Relation Age of Onset  . Heart attack Mother [id number].  Hypertension Mother   . Asthma Mother   . Cancer Mother        laryngeal  . Diabetes Mother        pre diabetic  . Heart disease Mother   . Mental illness Mother   . Alcohol abuse Mother   . Drug abuse Mother   . Depression Mother   . Anxiety disorder Mother   . Bipolar disorder Mother   . Learning disabilities Brother   . ADD / ADHD Brother   . Asthma Son   . Hyperlipidemia Brother   . Alcohol  abuse Father   . Heart disease Maternal Grandmother        heart attack, pacemaker  . Heart attack Maternal Grandmother   . Hypertension Maternal Grandmother   . Hypertension Maternal Grandfather   . Heart disease Paternal Grandmother        couple of major open heart surgeries, leaking valves  . Hypertension Paternal Grandmother   . Hypertension Paternal Grandfather   . Breast cancer Neg Hx     Social History   Tobacco Use  . Smoking status: Never Smoker  . Smokeless tobacco: Never Used  Substance Use Topics  . Alcohol use: No    Alcohol/week: 0.0 standard drinks     Current Outpatient Medications:  .  albuterol (PROVENTIL HFA;VENTOLIN HFA) 108 (90 Base) MCG/ACT inhaler, Inhale 2 puffs into the lungs every 6 (six) hours as needed for wheezing or shortness of breath., Disp: 18 g, Rfl: 1 .  busPIRone (BUSPAR) 5 MG tablet, Take 1 tablet (5 mg total) by mouth 3 (three) times daily as needed., Disp: 90 tablet, Rfl: 0 .  clobetasol (TEMOVATE) 0.05 % external solution, Apply 1 application topically 2 (two) times daily., Disp: 50 mL, Rfl: 0 .  Cyanocobalamin (B-12) 1000 MCG SUBL, Place 1 tablet under the tongue daily., Disp: 30 each, Rfl: 5 .  Elastic Bandages & Supports (MEDICAL COMPRESSION STOCKINGS) MISC, Wear from morning to night, but do not sleep in these, Disp: 1 each, Rfl: 2 .  Etonogestrel (NEXPLANON [place]), Inject into the skin., Disp: , Rfl:  .  famotidine (PEPCID) 20 MG tablet, Take 1 tablet (20 mg total) by mouth daily., Disp: 30 tablet, Rfl: 1 .  fluticasone (FLONASE) 50 MCG/ACT nasal spray, Place 2 sprays into both nostrils daily., Disp: 16 g, Rfl: 2 .  hydrOXYzine (ATARAX/VISTARIL) 25 MG tablet, Take 1 tablet (25 mg total) by mouth 3 (three) times daily as needed., Disp: 30 tablet, Rfl: 1 .  ibuprofen (ADVIL) 800 MG tablet, Take 1 tablet (800 mg total) by mouth every 8 (eight) hours as needed., Disp: 60 tablet, Rfl: 0 .  levothyroxine (SYNTHROID) 50 MCG tablet, Take 50 mcg by  mouth daily., Disp: , Rfl:  .  montelukast (SINGULAIR) 10 MG tablet, Take 1 tablet (10 mg total) by mouth at bedtime., Disp: 90 tablet, Rfl: 1 .  pregabalin (LYRICA) 75 MG capsule, Take 1 capsule (75 mg total) by mouth 2 (two) times daily., Disp: 180 capsule, Rfl: 1 .  tiZANidine (ZANAFLEX) 4 MG tablet, Take 0.5-1.5 tablets (2-6 mg total) by mouth every 8 (eight) hours as needed for muscle spasms (muscle tightness)., Disp: 90 tablet, Rfl: 2 .  traZODone (DESYREL) 50 MG tablet, Take 0.5-1 tablets (25-50 mg total) by mouth at bedtime as needed for sleep., Disp: 60 tablet, Rfl: 0 .  Vitamin D, Ergocalciferol, (DRISDOL) 1.25 MG (50000 UT) CAPS capsule, Take 1 capsule (50,000 Units total) by mouth every 7 (seven)  days., Disp: 12 capsule, Rfl: 1  Allergies  Allergen Reactions  . Latex Hives    (Balloons, condoms, underwear elastic, gloves)  . Shellfish Allergy Hives    With staff assistance, above reviewed with the patient today.  ROS: As per HPI, otherwise no specific complaints on a limited and focused system review   Objective  Virtual encounter, vitals not obtained.  There is no height or weight on file to calculate BMI.  Physical Exam   Appears in NAD via conversation, occas cough during conversation Breathing: No obvious respiratory distress. Speaking in complete sentences Neurological: Pt is alert and oriented, Speech is normal Psychiatric: Patient has a normal mood and affect, behavior is normal. Judgment and thought content normal.   No results found for this or any previous visit (from the past 72 hour(s)).  PHQ2/9: Depression screen [facility] 2/9 01/23/2020 01/10/2020 10/31/2019 10/31/2019 09/12/2019  Decreased Interest 0 0 1 1 0  Down, Depressed, Hopeless 0 0 _0 PHQ - 2 Score 0 0 _1 Altered sleeping - 0 1 1 0  Tired, decreased energy - 0 1 1 0  Change in appetite - 0 1 1 0  Feeling bad or failure about yourself  - 0 1 1 0  Trouble concentrating - 0 0 0 0  Moving slowly or  fidgety/restless - 0 0 0 0  Suicidal thoughts - 0 0 0 0  PHQ-9 Score - 0 _2 Difficult doing work/chores - Not difficult at all Somewhat difficult Somewhat difficult Not difficult at all  Some recent data might be hidden   PHQ-2/9 Result reviewed  Fall Risk: Fall Risk  01/23/2020 01/10/2020 10/31/2019 09/12/2019 06/26/2019  Falls in the past year? 0 0 0 0 0  Number falls in past yr: 0 0 0 0 0  Injury with Fall? 0 0 0 0 0  Comment - - - - -  Follow up Falls evaluation completed - - - -     Assessment & Plan  1. COVID-19  Educated on Covid and management recommendations, (monoclonal antibody treatments entertained for non-hospitalized patients at risk for severe disease within 10 days of symptom onset). Discussed management options, and discussed potentially a steroid addition, although noted the risk/benefits of this and some concerns with initiating presently noted All in all, she definitely does feel better over time, with her main complaints being her energy levels remaining quite low and feeling some shortness of breath when trying to get up and do things.  Agreed to continue treatment and symptomatic measures - stay well hydrated, rest, a mucinex product to continue as she notes that is one that has been helpful and tylenol or ibuprofen products prn for any low grade temps/fevers/aches Recommended she use her albuterol inhaler more frequently, 2-3 times a day routinely to help with clearance.  At times, she notes it sometimes makes her heart go a little faster for about 10 minutes, recommend trying 1 puff instead of 2 puffs to see if that persists.  Remain isolated presently until recovered and RTW discussed - recommended ok to return if at least 10 days since first symptoms, no fever at least 24-48 hours since recovery and other sx's have much improved.  She did not go back to work today as was originally planned, and agree with that, and a note was provided that she is still unable to  return to work.  Recommend when her symptoms improve, to follow-up with a call to  the office so we can document the improvements and a note can be given to return to work at that time if felt appropriate.  If sx's worsening acutely, such as increased SOB, fevers, more weakness or other concerning sx's arise, should f/u and can be seen in a more emergent setting like an urgent care or emergency room, and she was understanding of that, and if symptoms not improving as the week progresses, should follow-up again here to reassess.   2. Other fatigue She notes this has been the more problematic component of her symptoms when asked, likely related to the Covid illness, and continue to monitor presently.   I discussed the assessment and treatment plan with the patient. The patient was provided an opportunity to ask questions and all were answered. The patient agreed with the plan and demonstrated an understanding of the instructions.  Red flags and when to present for emergency care or RTC including fevers, chest pain, shortness of breath, new/worsening/un-resolving symptoms reviewed with patient at time of visit.   The patient was advised to call back or seek an in-person evaluation if the symptoms worsen or if the condition fails to improve as anticipated.  I provided 20 minutes of non-face-to-face time during this encounter that included discussing at length patient's sx/history, pertinent pmhx, medications, treatment and follow up plan. This time also included the necessary documentation, orders, and chart review.  Towanda Malkin, MD

## 2020-01-30 ENCOUNTER — Encounter: Payer: Self-pay | Admitting: Family Medicine

## 2020-01-30 ENCOUNTER — Other Ambulatory Visit: Payer: Self-pay

## 2020-01-30 ENCOUNTER — Ambulatory Visit
Admission: RE | Admit: 2020-01-30 | Discharge: 2020-01-30 | Disposition: A | Discharge status: Home / Self Care | Payer: Managed Care, Other (non HMO) | Source: Ambulatory Visit | Attending: Family Medicine | Admitting: Family Medicine

## 2020-01-30 ENCOUNTER — Ambulatory Visit (INDEPENDENT_AMBULATORY_CARE_PROVIDER_SITE_OTHER): Payer: Managed Care, Other (non HMO) | Admitting: Family Medicine

## 2020-01-30 VITALS — BP 128/74 | HR 89 | Temp 98.3°F | Resp 16 | Ht 63.0 in | Wt 229.9 lb

## 2020-01-30 DIAGNOSIS — F419 Anxiety disorder, unspecified: Secondary | ICD-10-CM

## 2020-01-30 DIAGNOSIS — K8020 Calculus of gallbladder without cholecystitis without obstruction: Secondary | ICD-10-CM | POA: Insufficient documentation

## 2020-01-30 DIAGNOSIS — F330 Major depressive disorder, recurrent, mild: Secondary | ICD-10-CM

## 2020-01-30 DIAGNOSIS — R1031 Right lower quadrant pain: Secondary | ICD-10-CM | POA: Diagnosis not present

## 2020-01-30 DIAGNOSIS — R7982 Elevated C-reactive protein (CRP): Secondary | ICD-10-CM

## 2020-01-30 DIAGNOSIS — N62 Hypertrophy of breast: Secondary | ICD-10-CM | POA: Diagnosis not present

## 2020-01-30 DIAGNOSIS — F5104 Psychophysiologic insomnia: Secondary | ICD-10-CM

## 2020-01-30 LAB — POCT URINALYSIS DIPSTICK
Bilirubin, UA: NEGATIVE
Glucose, UA: NEGATIVE
Ketones, UA: NEGATIVE
Nitrite, UA: NEGATIVE
Protein, UA: NEGATIVE
Spec Grav, UA: 1.01
Urobilinogen, UA: 0.2 U/dL
pH, UA: 6.5

## 2020-01-30 MED ORDER — TRAZODONE HCL 50 MG PO TABS
25.0000 mg | ORAL_TABLET | Freq: Every evening | ORAL | 0 refills | Status: DC | PRN
Start: 2020-01-30 — End: 2020-05-06

## 2020-01-30 MED ORDER — IOHEXOL 300 MG/ML  SOLN
150.0000 mL | Freq: Once | INTRAMUSCULAR | Status: AC | PRN
  Administered 2020-01-30: 125 mL via INTRAVENOUS

## 2020-01-30 NOTE — Progress Notes (Signed)
Name: Catherine Berg   MRN: [id number]    DOB: 1980/10/19   Date:01/30/2020       Progress Note  Subjective  Chief Complaint  Chief Complaint  Patient presents with  . Abdominal Pain    lower right quadrant     HPI  History of COVID-19: she had mild cough and rhinorrhea and got tested on 01/10/2020, the day after she developed sob, body aches, lack of taste and smell. She is still feeling fatigued and has intermittent headache. She has no stamina .   Morbid obesity: BMI over 40, recovering from COVID-19 , not eating much but also not active right now   RLQ: she woke up around 3 am with pain on RLQ , she denies nausea, vomiting or change in bowel movement. She did not take any medication. Last meal was around 8 pm last night, she did not had breakfast because she was not hungry this morning. No fever or chills. She still has her appendix. Denies dysuria or urinary frequency. She denies personal history of kidney stones, no vaginal discharge   MDD recurrent :  Symptoms were worse around the time her mother died in 07-04-14 , but had multiple episodes before that time that required her to take medication. She is having more anxiety, life has stressful and worse since COVID-19, states today her 40 yo daughter was sent home due to school exposure to COVID-19   Insomnia: we started her on trazodone back in June 2021, she states it helps her stay asleep and would like a refill.   Large breast: she states indentation under bra straps, makes her shoulders ache and is thinking again about breast reduction surgery . She wears 49 D, she has been very dissatisfied with her breast, tired of having large breasts, she found a Psychiatric nurse, but will hold off for now since not feeling well since COVID-19 infection this month. She saw  Dr. Silverio Lay in [place]   Elevated CRP- SED rate seen by Rheumatologist in the past and was given reassurance, but we will recheck since she has been sick due to [place] and not  recovering well.   Patient Active Problem List   Diagnosis Date Noted  . COVID-19 01/23/2020  . Arthralgia of hand 01/24/2019  . CRP elevated 01/24/2019  . ESR raised 01/24/2019  . Class 3 severe obesity due to excess calories without serious comorbidity in adult ([place]) 01/24/2019  . Iron deficiency anemia   . Palpitations 09/16/2018  . Coronary artery calcification 09/16/2018  . 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 Bilateral leg edema 04/20/2017  . Incidental lung nodule, > 29m and < 847m11/28/2018  . Hives 09/02/2015  . Morbid obesity ([place]04/13/2017  . Anxiety 07/09/2015  . 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 Thyroid nodule 02/01/2015  . Vitamin D deficiency disease   . Vitamin B12 deficiency   . History of abnormal cervical Pap smear   . Allergy-induced asthma, mild intermittent, uncomplicated 0612/24/4975  Past Surgical History:  Procedure Laterality Date  . CERVICAL POLYPECTOMY    . CESAREAN SECTION     x 2  . COLONOSCOPY WITH PROPOFOL N/A 11/14/2018   Procedure: COLONOSCOPY WITH PROPOFOL;  Surgeon: TaVirgel ManifoldMD;  [place] [facility];  Laterality: N/A;  . ESOPHAGOGASTRODUODENOSCOPY (EGD) WITH PROPOFOL N/A 11/14/2018   Procedure: ESOPHAGOGASTRODUODENOSCOPY (EGD) WITH PROPOFOL;  Surgeon: TaVirgel ManifoldMD;  [place] [facility]: [facility];  Laterality: N/A;  Latex  allergy  . GIVENS CAPSULE STUDY N/A 12/27/2018   Procedure: GIVENS CAPSULE STUDY;  Surgeon: Virgel Manifold, MD;  Location: [facility] ENDOSCOPY;  Service: Endoscopy;  Laterality: N/A;    Family History  Problem Relation Age of Onset  . Heart attack Mother 7  . Hypertension Mother   . Asthma Mother   . Cancer Mother        laryngeal  . Diabetes Mother        pre diabetic  . Heart disease Mother   . Mental illness Mother   . Alcohol abuse Mother   . Drug abuse Mother   . Depression Mother   . Anxiety disorder Mother   .  Bipolar disorder Mother   . Learning disabilities Brother   . ADD / ADHD Brother   . Asthma Son   . Hyperlipidemia Brother   . Alcohol abuse Father   . Heart disease Maternal Grandmother        heart attack, pacemaker  . Heart attack Maternal Grandmother   . Hypertension Maternal Grandmother   . Hypertension Maternal Grandfather   . Heart disease Paternal Grandmother        couple of major open heart surgeries, leaking valves  . Hypertension Paternal Grandmother   . Hypertension Paternal Grandfather   . Breast cancer Neg Hx     Social History   Tobacco Use  . Smoking status: Never Smoker  . Smokeless tobacco: Never Used  Substance Use Topics  . Alcohol use: No    Alcohol/week: 0.0 standard drinks     Current Outpatient Medications:  .  albuterol (PROVENTIL HFA;VENTOLIN HFA) 108 (90 Base) MCG/ACT inhaler, Inhale 2 puffs into the lungs every 6 (six) hours as needed for wheezing or shortness of breath., Disp: 18 g, Rfl: 1 .  clobetasol (TEMOVATE) 0.05 % external solution, Apply 1 application topically 2 (two) times daily., Disp: 50 mL, Rfl: 0 .  Cyanocobalamin (B-12) 1000 MCG SUBL, Place 1 tablet under the tongue daily., Disp: 30 each, Rfl: 5 .  Elastic Bandages & Supports (MEDICAL COMPRESSION STOCKINGS) MISC, Wear from morning to night, but do not sleep in these, Disp: 1 each, Rfl: 2 .  Etonogestrel (NEXPLANON [place]), Inject into the skin., Disp: , Rfl:  .  famotidine (PEPCID) 20 MG tablet, Take 1 tablet (20 mg total) by mouth daily., Disp: 30 tablet, Rfl: 1 .  fluticasone (FLONASE) 50 MCG/ACT nasal spray, Place 2 sprays into both nostrils daily., Disp: 16 g, Rfl: 2 .  hydrOXYzine (ATARAX/VISTARIL) 25 MG tablet, Take 1 tablet (25 mg total) by mouth 3 (three) times daily as needed., Disp: 30 tablet, Rfl: 1 .  ibuprofen (ADVIL) 800 MG tablet, Take 1 tablet (800 mg total) by mouth every 8 (eight) hours as needed., Disp: 60 tablet, Rfl: 0 .  levothyroxine (SYNTHROID) 50 MCG tablet, Take  50 mcg by mouth daily., Disp: , Rfl:  .  montelukast (SINGULAIR) 10 MG tablet, Take 1 tablet (10 mg total) by mouth at bedtime., Disp: 90 tablet, Rfl: 1 .  pregabalin (LYRICA) 75 MG capsule, Take 1 capsule (75 mg total) by mouth 2 (two) times daily., Disp: 180 capsule, Rfl: 1 .  tiZANidine (ZANAFLEX) 4 MG tablet, Take 0.5-1.5 tablets (2-6 mg total) by mouth every 8 (eight) hours as needed for muscle spasms (muscle tightness)., Disp: 90 tablet, Rfl: 2 .  traZODone (DESYREL) 50 MG tablet, Take 0.5-1 tablets (25-50 mg total) by mouth at bedtime as needed for sleep., Disp: 60 tablet, Rfl: 0 .  Vitamin D, Ergocalciferol, (DRISDOL) 1.25 MG (50000 UT) CAPS capsule, Take 1 capsule (50,000 Units total) by mouth every 7 (seven) days., Disp: 12 capsule, Rfl: 1  Allergies  Allergen Reactions  . Latex Hives    (Balloons, condoms, underwear elastic, gloves)  . Shellfish Allergy Hives    I personally reviewed active problem list, medication list, allergies, family history, social history, health maintenance with the patient/caregiver today.   ROS  Ten systems reviewed and is negative except as mentioned in HPI , she still has a cough   Objective  Vitals:   01/30/20 1119  BP: 128/74  Pulse: 89  Resp: 16  Temp: 98.3 F (36.8 C)  TempSrc: Oral  SpO2: 99%  Weight: 229 lb 14.4 oz (104.3 kg)  Height: '5\' 3"'  (1.6 m)    Body mass index is 40.72 kg/m.  Physical Exam  Constitutional: Patient appears well-developed and well-nourished. Obese  No distress.  HEENT: head atraumatic, normocephalic, pupils equal and reactive to light,  neck supple Cardiovascular: Normal rate, regular rhythm and normal heart sounds.  No murmur heard. No BLE edema. Pulmonary/Chest: Effort normal and breath sounds normal. No respiratory distress. Abdominal: Soft.  There is tenderness during palpation of RLQ , ? Rebound tenderness, normal bowel sounds  Psychiatric: Patient has a normal mood and affect. behavior is normal.  Judgment and thought content normal.  PHQ2/9: Depression screen [facility] 2/9 01/30/2020 01/23/2020 01/10/2020 10/31/2019 10/31/2019  Decreased Interest 1 0 0 1 1  Down, Depressed, Hopeless 1 0 0 1 1  PHQ - 2 Score 2 0 0 2 2  Altered sleeping - - 0 1 1  Tired, decreased energy - - 0 1 1  Change in appetite - - 0 1 1  Feeling bad or failure about yourself  - - 0 1 1  Trouble concentrating - - 0 0 0  Moving slowly or fidgety/restless - - 0 0 0  Suicidal thoughts - - 0 0 0  PHQ-9 Score - - 0 6 6  Difficult doing work/chores - - Not difficult at all Somewhat difficult Somewhat difficult  Some recent data might be hidden    phq 9 she is under stress we will recheck next visit    Fall Risk: Fall Risk  01/30/2020 01/23/2020 01/10/2020 10/31/2019 09/12/2019  Falls in the past year? 0 0 0 0 0  Number falls in past yr: 0 0 0 0 0  Injury with Fall? 0 0 0 0 0  Comment - - - - -  Follow up - Falls evaluation completed - - -     Functional Status Survey: Is the patient deaf or have difficulty hearing?: No Does the patient have difficulty seeing, even when wearing glasses/contacts?: No Does the patient have difficulty concentrating, remembering, or making decisions?: No Does the patient have difficulty walking or climbing stairs?: Yes Does the patient have difficulty dressing or bathing?: No Does the patient have difficulty doing errands alone such as visiting a doctor's office or shopping?: No   Assessment & Plan  1. Right lower quadrant abdominal pain  - POCT Urinalysis Dipstick - Urine Culture - CBC with Differential/Platelet - COMPLETE METABOLIC PANEL WITH GFR - CT Abdomen Pelvis W Contrast; Future  2. Large breasts  She found a Psychiatric nurse, waiting until she feels better   3. MDD (major depressive disorder), recurrent episode, mild (HCC)  Recurrent, not on medication, worse since COVID19 infection, we will re-evaluate in 1 month and see if she needs to resume  medication  4.  Anxiety  Getting worse due to COVID  5. Insomnia, psychophysiological  Continue Trazodone   6. Elevated C-reactive protein (CRP)  - Sedimentation rate - C-reactive protein

## 2020-01-31 ENCOUNTER — Encounter: Payer: Managed Care, Other (non HMO) | Admitting: Family Medicine

## 2020-01-31 LAB — CBC WITH DIFFERENTIAL/PLATELET
Absolute Monocytes: 449 cells/uL (ref 200–950)
Basophils Absolute: 33 cells/uL (ref 0–200)
Basophils Relative: 0.5 %
Eosinophils Absolute: 91 cells/uL (ref 15–500)
Eosinophils Relative: 1.4 %
HCT: 38.5 % (ref 35.0–45.0)
Hemoglobin: 12.7 g/dL (ref 11.7–15.5)
Lymphs Abs: 2678 cells/uL (ref 850–3900)
MCH: 28.7 pg (ref 27.0–33.0)
MCHC: 33 g/dL (ref 32.0–36.0)
MCV: 87.1 fL (ref 80.0–100.0)
MPV: 9.9 fL (ref 7.5–12.5)
Monocytes Relative: 6.9 %
Neutro Abs: 3250 cells/uL (ref 1500–7800)
Neutrophils Relative %: 50 %
Platelets: 340 10*3/uL (ref 140–400)
RBC: 4.42 10*6/uL (ref 3.80–5.10)
RDW: 13 % (ref 11.0–15.0)
Total Lymphocyte: 41.2 %
WBC: 6.5 10*3/uL (ref 3.8–10.8)

## 2020-01-31 LAB — COMPLETE METABOLIC PANEL WITH GFR
AG Ratio: 1.2 (calc) (ref 1.0–2.5)
ALT: 20 U/L (ref 6–29)
AST: 16 U/L (ref 10–30)
Albumin: 3.9 g/dL (ref 3.6–5.1)
Alkaline phosphatase (APISO): 58 U/L (ref 31–125)
BUN: 12 mg/dL (ref 7–25)
CO2: 24 mmol/L (ref 20–32)
Calcium: 9.2 mg/dL (ref 8.6–10.2)
Chloride: 103 mmol/L (ref 98–110)
Creat: 0.79 mg/dL (ref 0.50–1.10)
GFR, Est African American: 109 mL/min/{1.73_m2} (ref >=60)
GFR, Est Non African American: 94 mL/min/{1.73_m2} (ref >=60)
Globulin: 3.2 g/dL (calc) (ref 1.9–3.7)
Glucose, Bld: 74 mg/dL (ref 65–99)
Potassium: 4.5 mmol/L (ref 3.5–5.3)
Sodium: 137 mmol/L (ref 135–146)
Total Bilirubin: 0.5 mg/dL (ref 0.2–1.2)
Total Protein: 7.1 g/dL (ref 6.1–8.1)

## 2020-01-31 LAB — SEDIMENTATION RATE: Sed Rate: 29 mm/h — ABNORMAL HIGH (ref 0–20)

## 2020-01-31 LAB — URINE CULTURE
MICRO NUMBER:: 11005478
SPECIMEN QUALITY:: ADEQUATE

## 2020-01-31 LAB — C-REACTIVE PROTEIN: CRP: 16 mg/L — ABNORMAL HIGH (ref <8.0)

## 2020-02-05 ENCOUNTER — Encounter: Payer: Self-pay | Admitting: Family Medicine

## 2020-02-23 ENCOUNTER — Ambulatory Visit: Payer: Managed Care, Other (non HMO)

## 2020-02-28 ENCOUNTER — Ambulatory Visit (INDEPENDENT_AMBULATORY_CARE_PROVIDER_SITE_OTHER): Payer: Managed Care, Other (non HMO) | Admitting: Nurse Practitioner

## 2020-02-28 VITALS — BP 128/86 | HR 86 | Temp 97.7°F | Ht 63.0 in | Wt 228.0 lb

## 2020-02-28 DIAGNOSIS — Z8616 Personal history of COVID-19: Secondary | ICD-10-CM | POA: Diagnosis not present

## 2020-02-28 DIAGNOSIS — R439 Unspecified disturbances of smell and taste: Secondary | ICD-10-CM

## 2020-02-28 DIAGNOSIS — M62838 Other muscle spasm: Secondary | ICD-10-CM | POA: Insufficient documentation

## 2020-02-28 DIAGNOSIS — R5381 Other malaise: Secondary | ICD-10-CM | POA: Diagnosis not present

## 2020-02-28 DIAGNOSIS — R413 Other amnesia: Secondary | ICD-10-CM | POA: Diagnosis not present

## 2020-02-28 DIAGNOSIS — R0600 Dyspnea, unspecified: Secondary | ICD-10-CM

## 2020-02-28 DIAGNOSIS — M542 Cervicalgia: Secondary | ICD-10-CM | POA: Insufficient documentation

## 2020-02-28 DIAGNOSIS — R0609 Other forms of dyspnea: Secondary | ICD-10-CM | POA: Insufficient documentation

## 2020-02-28 MED ORDER — PREDNISONE 20 MG PO TABS: 20.0000 mg | ORAL_TABLET | Freq: Every day | ORAL | 0 refills | Status: AC

## 2020-02-28 NOTE — Patient Instructions (Signed)
History of Covid 19 Shortness of breath:   Stay well hydrated  Stay active  Deep breathing exercises  May start vitamin C 2,000 mg daily, vitamin D3 2,000 IU daily, Zinc 220 mg daily, and Quercetin 500 mg twice daily  May take tylenol or fever or pain  May take mucinex DM twice daily  Will order chest x ray   Neck strain/ muscle tightness:  Warm wet compresses  May take tylenol  Will order muscle relaxer  Will order prednisone   Memory loss Olfactory Dysfunction:  Will place referral to neurology  Will give hand out on memory care tips    Follow up:  Follow up in 2 months or sooner if needed

## 2020-02-28 NOTE — Progress Notes (Signed)
@Patient  ID: , female    DOB: [birth year], 39 y.o.   MRN: [id number]  Chief Complaint  Patient presents with  . New Patient (Initial Visit)    COVID 9/8 Sx: Memory issues, fatigue, no taste or smell    Referring provider: 11/8, MD   39 year old female with history of hypertension, asthma, thyroid disease.   HPI  Patient presents today for post covid care clinic visit.  She was diagnosed with Covid on 01/10/2020.  She was not hospitalized.  She states that she has been having ongoing issues with shortness of breath with exertion, memory loss, fatigue, loss of taste and smell.  Recent lab work was normal other than slightly elevated inflammatory markers.  Patient was walked in office today and O2 sats and heart rate remained normal throughout the entire walk.  Patient also complains today of bilateral pain to neck and shoulders. Denies f/c/s, n/v/d, hemoptysis, PND, chest pain or edema.      Allergies  Allergen Reactions  . Latex Hives    (Balloons, condoms, underwear elastic, gloves)  . Shellfish Allergy Hives    Immunization History  Administered Date(s) Administered  . Hepatitis A 01/31/2007, 08/10/2007  . Hepatitis B 01/31/2007, 08/10/2007, 07/02/2008  . Influenza,inj,Quad PF,6+ Mos 03/15/2018, 01/05/2019  . Td 10/29/1995, 08/03/2017  . Tdap 08/10/2007    Past Medical History:  Diagnosis Date  . Abnormal thyroid blood test   . Anemia   . Anxiety   . Asthma   . Complication of anesthesia    itching after c sections  . 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 Graves disease   . History of abnormal cervical Pap smear   . History of cervical polypectomy   . Hives 09/02/2015  . Hypertension   . IFG (impaired fasting glucose)   . Incidental lung nodule, > 42mm and < 32mm 03/31/2017   4 mm RLL lung nodule on chest CT Mar 31, 2017  . Low serum vitamin D   . Obesity   . Pregnancy induced hypertension    with 1st pregnancy, normal  pressure with 2nd  . Reflux   . Thyroid disease   . Vitamin B12 deficiency   . Vitamin D deficiency disease   . Wears dentures    full upper    Tobacco History: Social History   Tobacco Use  Smoking Status Never Smoker  Smokeless Tobacco Never Used   Counseling given: Not Answered   Outpatient Encounter Medications as of 02/28/2020  Medication Sig  . albuterol (PROVENTIL HFA;VENTOLIN HFA) 108 (90 Base) MCG/ACT inhaler Inhale 2 puffs into the lungs every 6 (six) hours as needed for wheezing or shortness of breath.  . clobetasol (TEMOVATE) 0.05 % external solution Apply 1 application topically 2 (two) times daily.  . Cyanocobalamin (B-12) 1000 MCG SUBL Place 1 tablet under the tongue daily.  03/01/2020 Bandages & Supports (MEDICAL COMPRESSION STOCKINGS) MISC Wear from morning to night, but do not sleep in these  . Etonogestrel (NEXPLANON [place]) Inject into the skin.  . famotidine (PEPCID) 20 MG tablet Take 1 tablet (20 mg total) by mouth daily.  . fluticasone (FLONASE) 50 MCG/ACT nasal spray Place 2 sprays into both nostrils daily.  . hydrOXYzine (ATARAX/VISTARIL) 25 MG tablet Take 1 tablet (25 mg total) by mouth 3 (three) times daily as needed.  Clinical research associate ibuprofen (ADVIL) 800 MG tablet Take 1 tablet (800 mg total) by mouth every 8 (eight) hours as needed.  Marland Kitchen levothyroxine (SYNTHROID)  50 MCG tablet Take 50 mcg by mouth daily.  . montelukast (SINGULAIR) 10 MG tablet Take 1 tablet (10 mg total) by mouth at bedtime.  . pregabalin (LYRICA) 75 MG capsule Take 1 capsule (75 mg total) by mouth 2 (two) times daily.  Marland Kitchen tiZANidine (ZANAFLEX) 4 MG tablet Take 0.5-1.5 tablets (2-6 mg total) by mouth every 8 (eight) hours as needed for muscle spasms (muscle tightness).  . traZODone (DESYREL) 50 MG tablet Take 0.5-1 tablets (25-50 mg total) by mouth at bedtime as needed for sleep.  . Vitamin D, Ergocalciferol, (DRISDOL) 1.25 MG (50000 UT) CAPS capsule Take 1 capsule (50,000 Units total) by mouth every 7  (seven) days.  . predniSONE (DELTASONE) 20 MG tablet Take 1 tablet (20 mg total) by mouth daily with breakfast for 5 days.   No facility-administered encounter medications on file as of 02/28/2020.     Review of Systems  Review of Systems  Constitutional: Positive for fatigue. Negative for fever.  HENT: Negative.   Respiratory: Positive for shortness of breath. Negative for cough.   Cardiovascular: Negative.  Negative for chest pain, palpitations and leg swelling.  Gastrointestinal: Negative.   Allergic/Immunologic: Negative.   Neurological: Negative.        Loss of taste and smell  Psychiatric/Behavioral: Positive for decreased concentration.       Physical Exam  BP 128/86 (BP Location: Right Arm)   Pulse 86   Temp 97.7 F (36.5 C)   Ht 5\' 3"  (1.6 m)   Wt 228 lb 0.1 oz (103.4 kg)   SpO2 98%   BMI 40.39 kg/m   Wt Readings from Last 5 Encounters:  02/28/20 228 lb 0.1 oz (103.4 kg)  01/30/20 229 lb 14.4 oz (104.3 kg)  01/10/20 220 lb (99.8 kg)  10/31/19 227 lb 11.2 oz (103.3 kg)  09/12/19 224 lb (101.6 kg)     Physical Exam Vitals and nursing note reviewed.  Constitutional:      General: She is not in acute distress.    Appearance: She is well-developed.  Cardiovascular:     Rate and Rhythm: Normal rate and regular rhythm.  Pulmonary:     Effort: Pulmonary effort is normal.     Breath sounds: Normal breath sounds.  Neurological:     Mental Status: She is alert and oriented to person, place, and time.  Psychiatric:        Mood and Affect: Mood normal.        Behavior: Behavior normal.       Imaging: CT Abdomen Pelvis W Contrast  Result Date: 01/30/2020 CLINICAL DATA:  Right lower quadrant pain for several hours EXAM: CT ABDOMEN AND PELVIS WITH CONTRAST TECHNIQUE: Multidetector CT imaging of the abdomen and pelvis was performed using the standard protocol following bolus administration of intravenous contrast. CONTRAST:  02/01/2020 OMNIPAQUE IOHEXOL 300 MG/ML   SOLN COMPARISON:  None. FINDINGS: Lower chest: No acute abnormality. Hepatobiliary: Liver is within normal limits. The gallbladder is decompressed. Few small calcifications are noted within likely related to cholelithiasis. Pancreas: Unremarkable. No pancreatic ductal dilatation or surrounding inflammatory changes. Spleen: Normal in size without focal abnormality. Adrenals/Urinary Tract: Adrenal glands are unremarkable. Kidneys demonstrate a normal enhancement pattern bilaterally. No renal calculi or obstructive changes are noted. The bladder is well distended. Stomach/Bowel: The appendix is well visualized and within normal limits. No inflammatory changes are seen. Small bowel and stomach are unremarkable. Colon shows no obstructive or inflammatory changes. Vascular/Lymphatic: No significant vascular findings are present. No enlarged  abdominal or pelvic lymph nodes. Reproductive: Uterus and bilateral adnexa are unremarkable. Other: No abdominal wall hernia or abnormality. No abdominopelvic ascites. Musculoskeletal: No acute or significant osseous findings. IMPRESSION: Normal-appearing appendix. Decompressed gallbladder with a few small gallstones within. No complicating factors are noted. Electronically Signed   By: Alcide Clever M.D.   On: 01/30/2020 15:48     Assessment & Plan:   History of COVID-19 Shortness of breath:   Stay well hydrated  Stay active  Deep breathing exercises  May start vitamin C 2,000 mg daily, vitamin D3 2,000 IU daily, Zinc 220 mg daily, and Quercetin 500 mg twice daily  May take tylenol or fever or pain  May take mucinex DM twice daily  Will order chest x ray   Neck strain/ muscle tightness:  Warm wet compresses  May take tylenol  Will order muscle relaxer  Will order prednisone   Memory loss Olfactory Dysfunction:  Will place referral to neurology  Will give hand out on memory care tips    Follow up:  Follow up in 2 months or sooner if  needed      Ivonne Andrew, NP 02/28/2020

## 2020-02-28 NOTE — Assessment & Plan Note (Signed)
Shortness of breath:   Stay well hydrated  Stay active  Deep breathing exercises  May start vitamin C 2,000 mg daily, vitamin D3 2,000 IU daily, Zinc 220 mg daily, and Quercetin 500 mg twice daily  May take tylenol or fever or pain  May take mucinex DM twice daily  Will order chest x ray   Neck strain/ muscle tightness:  Warm wet compresses  May take tylenol  Will order muscle relaxer  Will order prednisone   Memory loss Olfactory Dysfunction:  Will place referral to neurology  Will give hand out on memory care tips    Follow up:  Follow up in 2 months or sooner if needed

## 2020-03-05 ENCOUNTER — Other Ambulatory Visit: Payer: Self-pay

## 2020-03-05 ENCOUNTER — Encounter: Payer: Self-pay | Admitting: Physical Therapy

## 2020-03-05 ENCOUNTER — Ambulatory Visit: Payer: Managed Care, Other (non HMO) | Attending: Nurse Practitioner | Admitting: Physical Therapy

## 2020-03-05 DIAGNOSIS — M542 Cervicalgia: Secondary | ICD-10-CM | POA: Diagnosis present

## 2020-03-05 DIAGNOSIS — R262 Difficulty in walking, not elsewhere classified: Secondary | ICD-10-CM | POA: Insufficient documentation

## 2020-03-05 DIAGNOSIS — M6281 Muscle weakness (generalized): Secondary | ICD-10-CM

## 2020-03-05 DIAGNOSIS — R29898 Other symptoms and signs involving the musculoskeletal system: Secondary | ICD-10-CM | POA: Insufficient documentation

## 2020-03-05 NOTE — Therapy (Signed)
[facility] [address] [place], [address] Phone: [phone]   Fax:  [phone]  Physical Therapy Evaluation  Patient Details  Name: Catherine Berg MRN: [id number] Date of Birth: [birth year] Referring Provider (PT): Kriste Basque. Nils Pyle, NP   Encounter Date: 03/05/2020   PT End of Session - 03/05/20 1442    Visit Number 1    Number of Visits 24    Date for PT Re-Evaluation 05/28/20    Authorization Type [employer] MANAGED reporting period from 03/05/2020    PT Start Time 1436    PT Stop Time 1530    PT Time Calculation (min) 54 min    Activity Tolerance Patient tolerated treatment well    Behavior During Therapy [facility] for tasks assessed/performed           Past Medical History:  Diagnosis Date  . Abnormal thyroid blood test   . Anemia   . Anxiety   . Asthma   . Complication of anesthesia    itching after c sections  . 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 Graves disease   . History of abnormal cervical Pap smear   . History of cervical polypectomy   . Hives 09/02/2015  . Hypertension   . IFG (impaired fasting glucose)   . Incidental lung nodule, > 75m and < 88m11/28/2018   4 mm RLL lung nodule on chest CT Mar 31, 2017  . Low serum vitamin D   . Obesity   . Pregnancy induced hypertension    with 1st pregnancy, normal pressure with 2nd  . Reflux   . Thyroid disease   . Vitamin B12 deficiency   . Vitamin D deficiency disease   . Wears dentures    full upper    Past Surgical History:  Procedure Laterality Date  . CERVICAL POLYPECTOMY    . CESAREAN SECTION     x 2  . COLONOSCOPY WITH PROPOFOL N/A 11/14/2018   Procedure: COLONOSCOPY WITH PROPOFOL;  Surgeon: TaVirgel ManifoldMD;  [facility]: [facility];  Laterality: N/A;  . ESOPHAGOGASTRODUODENOSCOPY (EGD) WITH PROPOFOL N/A 11/14/2018   Procedure: ESOPHAGOGASTRODUODENOSCOPY (EGD) WITH  PROPOFOL;  Surgeon: TaVirgel ManifoldMD;  [facility]: [facility];  Laterality: N/A;  Latex allergy  . GIVENS CAPSULE STUDY N/A 12/27/2018   Procedure: GIVENS CAPSULE STUDY;  Surgeon: TaVirgel ManifoldMD;  Location: [facility] ENDOSCOPY;  Service: Endoscopy;  Laterality: N/A;    There were no vitals filed for this visit.    Subjective Assessment - 03/05/20 1501    Subjective Patient states condition started when she got COVID19 Sep. 8, 2021. She states everyone in her household got it and has recovered but she has had lingering and profound symptoms of fatigue, activity intolerance, and brain fog that is severely limiting her function and has prevented return to work. She is having a lot of fatigue and lack of energy and breathlessness with simple tasks. She was not hospitalized with COVID19. She was not vaccinated and she has not been vaccinated since. This time last year she could walk over 2 miles a day. Now if she walks up a hill in her back yard she gets tired and has to go slow. 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 Now it is hard to explain because her mind says "you can't  do that" when she looks at what she was doing before. 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 Patient also has a bit of memory loss and at this point states it is hard to make sense of some of the physical therapists subjective questions. States she has also been referred to the neurologist and imaging of her lungs. No mention of speech therapy. She has not seen the neurologist yet or gotten the lung scans yet. She has not received any other treatments than described above. Tomorrow she sees her PCP again. Patient also notes she started having neck pain while recovering from [place]. She has pain in her neck when she turns both  ways. This also started after COVID19 like an after effect of COVID19. She has difficulty with simple tasks such as mopping the floor: "If I mop the floor, once I finish I am out of breath, very tired, almost like I am going to go into some type of asthma attack." She will sit for at least 25-30 min after mopping to recover. States it feels almost like she is trying to get control of her mind. She is trying to get everything back together to prepare for the next task. Must sit down after she takes a shower an dry off. Able to continue in 10-15 min. She is very behind on her housework and responsibilities.The hardest thing physically she has done since having COVID19 is go to her son's game on Friday. The walk to the stadium is long. She stays in the bed longer that usual on Saturday. Feels drained from Friday.   It takes her all weekend to recover from going to the game. She feels really tired and drained. Does get a cough and sore throat. She also feels drained after doing cognitive activities. She could not remember the name of her company's website.   Pertinent History Patient is a 39 y.o. female who presents to outpatient physical therapy with a referral for medical diagnosis history of COVID-19, physical deconditioning. This patient's chief complaints consist of decreased activity tolerance, fatigue, poor endurance, and difficulty with cognition leading to the following functional deficits: difficulty with all aspects of her life. Unable to work, difficulty with ADLs, dressing, showering, washing the clothes, caring for her children, household and community ambulation, IADLs, difficulty with decision making and tasks requring executive function. Relevant past medical history and comorbidities include hypertension, graves disease, anemia, asthma, leg pain (no dx, takes lyrica), hand pain (went to see if she had RA, but said she did not).  Patient denies hx of cancer, stroke, seizures, major cardiac events,  diabetes, unexplained weight loss, changes in bowel or bladder problems, new onset stumbling or dropping things.    How long can you walk comfortably? 15 minutes    Patient Stated Goals "get back to normal"    Currently in Pain? Yes    Pain Score 5    W: 7/10; B: 1/10   Pain Location Neck    Pain Orientation Upper;Mid    Pain Descriptors / Indicators Aching    Pain Type Chronic pain    Pain Radiating Towards no radiation, no headaches    Pain Onset More than a month ago    Pain Frequency Constant    Aggravating Factors  when she has to drive due to turning head, turning head    Pain Relieving Factors nothing  [facility] PT Assessment - 03/05/20 0001      Assessment   Medical Diagnosis history of COVID-19, physical deconditioning    Referring Provider (PT) Kriste Basque. Nils Pyle, NP    Onset Date/Surgical Date 01/10/20    Hand Dominance Right    Next MD Visit 12/21/20201    Prior Therapy none for this condition prior to current episode of care      Prior Function   Level of Independence Independent    Vocation Full time employment    Vocation Requirements standing, walking, computer work, Therapist, art, making schedules    Leisure 30 and 39 year old      Cognition   Overall Cognitive Status Impaired/Different from baseline           OBJECTIVE  OBSERVATION/INSPECTION  . Posture: forward head, rounded shoulders, slumped in sitting. . Tremor: none . Muscle bulk: generally WFL . Skin: generally WFL . Transfers: sit <> stand I but slow . Gait: grossly WFL for household ambulation but fatigues quickly and is unable to ambulate more than 5 minutes.  More detailed gait analysis deferred to later date as needed.    NEUROLOGICAL Dermatomes . C3-T1 appears equal and intact to light touch except LC3 and R C7 diminished to light touch.  . L3-S2 appears equal and intact to light touch except left C2 diminished to light touch Myotomes . C3-T1 appears intact . L3-S2  appears intact  SPINE MOTION Cervical Spine AROM *Indicates pain Flexion: = 30. Extension: = 25 Rotation: R= 22, L = 33 (bilateral neck pain both ways). Side Flexion: R= 20 left and right pain, L = 30 right pain.   PERIPHERAL JOINT MOTION (in degrees) B UE and LE AROM appears WFL for basic mobility , more detailed exam deferred to later date as needed.   MUSCLE PERFORMANCE (MMT):  B UE and LE MMT WFL for basic mobility , more detailed exam deferred to later date as needed. Does fatigue quickly with repeated movement.   FUNCTIONAL/BALANCE TESTS:  Five Time Sit to Stand (5TSTS): 17 seconds from 18.5 inch plinth with no UE support. (starting vitals: HR 79 bpm, SpO2 98; vitals directly after: HR 88 bpm, SpO2 97).     Six Minute Walk Test (6MWT): 800 feet (HR up to 102 bpm, SpO2 95%; stopped 1 min early due to fatigue).   EDUCATION/COGNITION: Patient is alert and oriented X 4 but reports feeling difficulty with cognition. Does require extra time to respond at times.   Objective measurements completed on examination: See above findings.     PT Education - 03/05/20 1441    Education Details Exercise purpose/form. Self management techniques. Education on diagnosis, prognosis, POC, anatomy and physiology of current condition Education on HEP including handout on Long COVID and pacing.    Person(s) Educated Patient    Methods Explanation;Demonstration;Tactile cues;Verbal cues;Handout    Comprehension Verbalized understanding;Returned demonstration;Verbal cues required;Tactile cues required;Need further instruction            PT Short Term Goals - 03/05/20 1838      PT SHORT TERM GOAL #1   Title Be independent with initial home exercise program for self-management of symptoms.    Baseline introduced pacing (03/05/2020);    Time 3    Period Weeks    Status New    Target Date 03/26/20             PT Long Term Goals - 03/05/20 1839      PT LONG TERM GOAL #  1   Title Be  independent with a long-term home exercise program for self-management of symptoms.    Baseline introduced pacing (03/05/2020);    Time 12    Period Weeks    Status New   TARGET DATE FOR ALL LONG TERM GOALS: 05/28/2020     PT LONG TERM GOAL #2   Title Demonstrate improved FOTO score to 65 by visit #10 demonstrate improvement in overall condition and self-reported functional ability.    Baseline 51 (03/05/2020);    Time 12    Period Weeks    Status New      PT LONG TERM GOAL #3   Title Patient will improve 6 minute walk test to equal or greater than 1400 feet with no assistive device to demonstrate improved community ambulation for improved abliity to return to work (03/05/2020);    Baseline 800 feet and unable to walk longer than 5 minutes (03/05/2020);    Time 12    Period Weeks    Status New      PT LONG TERM GOAL #4   Title Pateint will complete 5 Time Sit To Stand Test in equal or less than 12 seconds from 18.5 inch surface with no UE support to demonstrate improved LE muscle performance to improve ability to perform household chores and work activities    Baseline 17 seconds from 18.5 inch plinth, no UE support (03/05/2020);    Time 12    Period Weeks    Status New    Target Date 05/28/20      PT LONG TERM GOAL #5   Title Complete community, work and/or recreational activities without limitation due to current condition    Baseline difficulty with all aspects of her life. Unable to work, difficulty with ADLs, dressing, showering, washing the clothes, caring for her children, household and community ambulation, IADLs, difficulty with decision making and executive function tasks (03/05/2020);    Time 12    Period Weeks    Status New                  Plan - 03/05/20 1854    Clinical Impression Statement Patient is a 39 y.o. 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 Patient also has neck pain that started while recovering from [place]. Patient presents with significant pain, range of motion, activity tolerance, cardiorespiratory, sensation, dyspnea, fatigue impairments that are limiting ability to complete all aspects of her life without difficulty. Unable to work, difficulty with ADLs, dressing, showering, washing the clothes, caring for her children, household and community ambulation, IADLs, difficulty with decision making and executive function tasks. Patient will benefit from skilled physical therapy intervention to address current body structure impairments and activity limitations to improve function and work towards goals set in current POC in order to return to prior level of function or maximal functional improvement. Patient may also benefit from evaluation and treatment by Speech Language Pathologist to address cognitive difficulties.    Personal Factors and Comorbidities Age;Comorbidity 3+;Time since onset of injury/illness/exacerbation;Past/Current Experience;Fitness    Comorbidities Relevant past medical history and comorbidities include hypertension, graves disease, anemia, asthma, leg pain (no dx, takes lyrica), hand pain (went to see if she had RA, but said she did not).    Examination-Activity  Limitations Bathing;Hygiene/Grooming;Bed Mobility;Lift;Stairs;Locomotion Level;Stand;Caring for Others;Carry;Dressing    Examination-Participation Restrictions Laundry;Cleaning;Shop;Community Activity;Meal Prep;Driving;Occupation;Yard Work;Interpersonal Relationship   difficulty with all aspects of her life. Unable to work, difficulty with ADLs, dressing, showering, washing the clothes, caring for her children, household and community  ambulation, IADLs, difficulty with decision making and executive function tasks.   Stability/Clinical Decision Making Evolving/Moderate complexity    Clinical Decision Making Moderate    Rehab Potential Good    PT Frequency 2x / week    PT Duration 12 weeks    PT Treatment/Interventions ADLs/Self Care Home Management;Aquatic Therapy;Moist Heat;Cryotherapy;Therapeutic activities;Therapeutic exercise;Neuromuscular re-education;Cognitive remediation;Patient/family education;Manual techniques;Dry needling;Passive range of motion;Energy conservation;Spinal Manipulations;Joint Manipulations    PT Next Visit Plan gentle exercise as tolerated, education on pacing    PT Home Exercise Plan review handouts for discussion next session    Recommended Other Services consider evaluation and treatment by Speech Language Pathologist for cognitive difficulty    Consulted and Agree with Plan of Care Patient           Patient will benefit from skilled therapeutic intervention in order to improve the following deficits and impairments:  Decreased cognition, Impaired sensation, Pain, Cardiopulmonary status limiting activity, Decreased mobility, Impaired perceived functional ability, Decreased strength, Decreased range of motion, Decreased endurance, Decreased activity tolerance, Difficulty walking, Obesity  Visit Diagnosis: Muscle weakness (generalized)  Other symptoms and signs involving the musculoskeletal system  Difficulty in walking, not elsewhere classified  Cervicalgia     Problem List Patient Active Problem List   Diagnosis Date Noted  . History of COVID-19 02/28/2020  . 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 Memory loss 02/28/2020  . Physical deconditioning 02/28/2020  . Dyspnea on exertion 02/28/2020  . Neck pain, bilateral 02/28/2020  . Muscle spasms of neck 02/28/2020  . Gallstones 01/30/2020  . COVID-19 01/23/2020  . Arthralgia of hand 01/24/2019  . CRP elevated 01/24/2019  . ESR  raised 01/24/2019  . Class 3 severe obesity due to excess calories without serious comorbidity in adult ([place]) 01/24/2019  . Iron deficiency anemia   . Palpitations 09/16/2018  . Coronary artery calcification 09/16/2018  . 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 Bilateral leg edema 04/20/2017  . Incidental lung nodule, > 51m and < 830m11/28/2018  . Hives 09/02/2015  . Morbid obesity ([place]/13/2017  . Anxiety 07/09/2015  . 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 Thyroid nodule 02/01/2015  . Vitamin D deficiency disease   . Vitamin B12 deficiency   . History of abnormal cervical Pap smear   . Allergy-induced asthma, mild intermittent, uncomplicated [id number]  SaEverlean AlstromSnGraylon GoodPT, DPT 03/05/20, 7:01 PM  [facility] [id number]: [phone] Fax:  [phone]: TaKEYSHAWNA PROUSERN: [id number] of Birth: 01/1981-07-21

## 2020-03-06 ENCOUNTER — Telehealth: Payer: Self-pay | Admitting: Physical Therapy

## 2020-03-06 ENCOUNTER — Ambulatory Visit (INDEPENDENT_AMBULATORY_CARE_PROVIDER_SITE_OTHER): Payer: Managed Care, Other (non HMO) | Admitting: Family Medicine

## 2020-03-06 ENCOUNTER — Encounter: Payer: Self-pay | Admitting: Family Medicine

## 2020-03-06 ENCOUNTER — Telehealth: Payer: Self-pay | Admitting: Family Medicine

## 2020-03-06 VITALS — BP 120/82 | HR 85 | Temp 98.2°F | Resp 16 | Ht 63.0 in | Wt 232.5 lb

## 2020-03-06 DIAGNOSIS — U099 Post covid-19 condition, unspecified: Secondary | ICD-10-CM

## 2020-03-06 DIAGNOSIS — Z23 Encounter for immunization: Secondary | ICD-10-CM | POA: Diagnosis not present

## 2020-03-06 DIAGNOSIS — F331 Major depressive disorder, recurrent, moderate: Secondary | ICD-10-CM

## 2020-03-06 MED ORDER — AMPHETAMINE-DEXTROAMPHET ER 10 MG PO CP24: 10.0000 mg | ORAL_CAPSULE | Freq: Every day | ORAL | 0 refills | Status: DC

## 2020-03-06 MED ORDER — DULOXETINE HCL 30 MG PO CPEP: 30.0000 mg | ORAL_CAPSULE | Freq: Every day | ORAL | 0 refills | Status: DC

## 2020-03-06 NOTE — Patient Instructions (Signed)
Start duloxetine first in the mornings with food, after first week take two daily and if tolerated you can add Adderal

## 2020-03-06 NOTE — Progress Notes (Addendum)
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 She continues to have fatigue, worried about memory loss ( having difficulty even cooking), continues to have lack of taste and smell. She was seen by COVID-19 clinic end of October and was referred to PT and also neurologist - her visit with neurologist is not until end of 06-08-2022.   MDD recurrent :  Symptoms were worse around the time her mother died in 06/08/2014 , but had multiple episodes before that time that required her to take medication. She is having more anxiety, life has stressful and worse since COVID-19. She states her sleep has been disrupted, she has been feeling very anxious, has palpitations at night. She states since COVID-19 she does not feel the same. Still on FMLA , worried about the unknown. She has increase lack of motivation. She has taken antidepressants before but was doing well, we will add Duloxetine since weight neutral and may increase motivation , discussed possible side effects of medications   Patient Active Problem List   Diagnosis Date Noted  . History of COVID-19 02/28/2020  . 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 Memory loss 02/28/2020  . Physical deconditioning 02/28/2020  . Dyspnea on exertion 02/28/2020  . Neck pain, bilateral 02/28/2020  . Muscle spasms of neck 02/28/2020  . Gallstones 01/30/2020  . COVID-19 01/23/2020  . Arthralgia of hand 01/24/2019  . CRP elevated 01/24/2019  . ESR raised 01/24/2019  . Class 3 severe obesity due to excess calories without serious comorbidity in adult ([place]) 01/24/2019  . Iron deficiency anemia   . Palpitations 09/16/2018  . Coronary artery calcification 09/16/2018  . 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 Bilateral leg edema  04/20/2017  . Incidental lung nodule, > 70m and < 854m11/28/2018  . Hives 09/02/2015  . Morbid obesity ([place]/13/2017  . Anxiety 07/09/2015  . 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 Thyroid nodule 02/01/2015  . Vitamin D deficiency disease   . Vitamin B12 deficiency   . History of abnormal cervical Pap smear   . Allergy-induced asthma, mild intermittent, uncomplicated 0671/[id number]  Past Surgical History:  Procedure Laterality Date  . CERVICAL POLYPECTOMY    . CESAREAN SECTION     x 2  . COLONOSCOPY WITH PROPOFOL N/A 11/14/2018   Procedure: COLONOSCOPY WITH PROPOFOL;  Surgeon: TaVirgel ManifoldMD;  [facility] Service: [place];  Laterality: N/A;  . ESOPHAGOGASTRODUODENOSCOPY (EGD) WITH PROPOFOL N/A 11/14/2018   Procedure: ESOPHAGOGASTRODUODENOSCOPY (EGD) WITH PROPOFOL;  Surgeon: TaVirgel ManifoldMD;  [facility] [facility]: [place];  Laterality: N/A;  Latex allergy  . GIVENS CAPSULE STUDY N/A 12/27/2018   Procedure: GIVENS CAPSULE STUDY;  Surgeon: TaVirgel ManifoldMD;  Location: [facility] ENDOSCOPY;  Service: Endoscopy;  Laterality: N/A;    Family History  Problem Relation Age of Onset  . Heart attack Mother 4769. Hypertension Mother   . Asthma Mother   . Cancer Mother        laryngeal  . Diabetes Mother        pre diabetic  . Heart disease Mother   . Mental illness Mother   . Alcohol abuse Mother   .  Drug abuse Mother   . Depression Mother   . Anxiety disorder Mother   . Bipolar disorder Mother   . Learning disabilities Brother   . ADD / ADHD Brother   . Asthma Son   . Hyperlipidemia Brother   . Alcohol abuse Father   . Heart disease Maternal Grandmother        heart attack, pacemaker  . Heart attack Maternal Grandmother   . Hypertension Maternal Grandmother   . Hypertension Maternal Grandfather   . Heart disease Paternal Grandmother        couple of major open heart surgeries, leaking valves  . Hypertension  Paternal Grandmother   . Hypertension Paternal Grandfather   . Breast cancer Neg Hx     Social History   Tobacco Use  . Smoking status: Never Smoker  . Smokeless tobacco: Never Used  Substance Use Topics  . Alcohol use: No    Alcohol/week: 0.0 standard drinks     Current Outpatient Medications:  .  albuterol (PROVENTIL HFA;VENTOLIN HFA) 108 (90 Base) MCG/ACT inhaler, Inhale 2 puffs into the lungs every 6 (six) hours as needed for wheezing or shortness of breath., Disp: 18 g, Rfl: 1 .  clobetasol (TEMOVATE) 0.05 % external solution, Apply 1 application topically 2 (two) times daily., Disp: 50 mL, Rfl: 0 .  Cyanocobalamin (B-12) 1000 MCG SUBL, Place 1 tablet under the tongue daily., Disp: 30 each, Rfl: 5 .  Elastic Bandages & Supports (MEDICAL COMPRESSION STOCKINGS) MISC, Wear from morning to night, but do not sleep in these, Disp: 1 each, Rfl: 2 .  Etonogestrel (NEXPLANON [place]), Inject into the skin., Disp: , Rfl:  .  famotidine (PEPCID) 20 MG tablet, Take 1 tablet (20 mg total) by mouth daily., Disp: 30 tablet, Rfl: 1 .  fluticasone (FLONASE) 50 MCG/ACT nasal spray, Place 2 sprays into both nostrils daily., Disp: 16 g, Rfl: 2 .  hydrOXYzine (ATARAX/VISTARIL) 25 MG tablet, Take 1 tablet (25 mg total) by mouth 3 (three) times daily as needed., Disp: 30 tablet, Rfl: 1 .  ibuprofen (ADVIL) 800 MG tablet, Take 1 tablet (800 mg total) by mouth every 8 (eight) hours as needed., Disp: 60 tablet, Rfl: 0 .  levothyroxine (SYNTHROID) 50 MCG tablet, Take 50 mcg by mouth daily., Disp: , Rfl:  .  montelukast (SINGULAIR) 10 MG tablet, Take 1 tablet (10 mg total) by mouth at bedtime., Disp: 90 tablet, Rfl: 1 .  pregabalin (LYRICA) 75 MG capsule, Take 1 capsule (75 mg total) by mouth 2 (two) times daily., Disp: 180 capsule, Rfl: 1 .  tiZANidine (ZANAFLEX) 4 MG tablet, Take 0.5-1.5 tablets (2-6 mg total) by mouth every 8 (eight) hours as needed for muscle spasms (muscle tightness)., Disp: 90 tablet, Rfl:  2 .  traZODone (DESYREL) 50 MG tablet, Take 0.5-1 tablets (25-50 mg total) by mouth at bedtime as needed for sleep., Disp: 60 tablet, Rfl: 0 .  Vitamin D, Ergocalciferol, (DRISDOL) 1.25 MG (50000 UT) CAPS capsule, Take 1 capsule (50,000 Units total) by mouth every 7 (seven) days., Disp: 12 capsule, Rfl: 1  Allergies  Allergen Reactions  . Latex Hives    (Balloons, condoms, underwear elastic, gloves)  . Shellfish Allergy Hives    I personally reviewed active problem list, medication list, allergies, family history, social history, health maintenance with the patient/caregiver today.   ROS  Ten systems reviewed and is negative except as mentioned in HPI   Objective  Vitals:   03/06/20 0957  BP: 120/82  Pulse:  85  Resp: 16  Temp: 98.2 F (36.8 C)  TempSrc: Oral  SpO2: 99%  Weight: 232 lb 8 oz (105.5 kg)  Height: 5' 3" (1.6 m)    Body mass index is 41.19 kg/m.  Physical Exam  Constitutional: Patient appears well-developed and well-nourished. Obese  No distress.  HEENT: head atraumatic, normocephalic, pupils equal and reactive to light,  neck supple Cardiovascular: Normal rate, regular rhythm and normal heart sounds.  No murmur heard. No BLE edema. Pulmonary/Chest: Effort normal and breath sounds normal. No respiratory distress. Abdominal: Soft.  There is no tenderness. Psychiatric: Patient has a normal mood and affect. behavior is normal. Judgment and thought content normal.  Recent Results (from the past 2160 hour(s))  POCT Urinalysis Dipstick     Status: Abnormal   Collection Time: 01/30/20 11:47 AM  Result Value Ref Range   Color, UA Yellow    Clarity, UA Clear    Glucose, UA Negative Negative   Bilirubin, UA Negative    Ketones, UA Negative    Spec Grav, UA 1.010 1.010 - 1.025   Blood, UA Trace    pH, UA 6.5 5.0 - 8.0   Protein, UA Negative Negative   Urobilinogen, UA 0.2 0.2 or 1.0 E.U./dL   Nitrite, UA Negative    Leukocytes, UA Trace (A) Negative    Appearance Cloudy    Odor Yes   CBC with Differential/Platelet     Status: None   Collection Time: 01/30/20 12:38 PM  Result Value Ref Range   WBC 6.5 3.8 - 10.8 Thousand/uL   RBC 4.42 3.80 - 5.10 Million/uL   Hemoglobin 12.7 11.7 - 15.5 g/dL   HCT 38.5 35 - 45 %   MCV 87.1 80.0 - 100.0 fL   MCH 28.7 27.0 - 33.0 pg   MCHC 33.0 32.0 - 36.0 g/dL   RDW 13.0 11.0 - 15.0 %   Platelets 340 140 - 400 Thousand/uL   MPV 9.9 7.5 - 12.5 fL   Neutro Abs 3,250 1,500 - 7,800 cells/uL   Lymphs Abs 2,678 850 - 3,900 cells/uL   Absolute Monocytes 449 200 - 950 cells/uL   Eosinophils Absolute 91 15.0 - 500.0 cells/uL   Basophils Absolute 33 0.0 - 200.0 cells/uL   Neutrophils Relative % 50 %   Total Lymphocyte 41.2 %   Monocytes Relative 6.9 %   Eosinophils Relative 1.4 %   Basophils Relative 0.5 %  COMPLETE METABOLIC PANEL WITH GFR     Status: None   Collection Time: 01/30/20 12:38 PM  Result Value Ref Range   Glucose, Bld 74 65 - 99 mg/dL    Comment: .            Fasting reference interval .    BUN 12 7 - 25 mg/dL   Creat 0.79 0.50 - 1.10 mg/dL   GFR, Est Non African American 94 > OR = 60 mL/min/1.35m   GFR, Est African American 109 > OR = 60 mL/min/1.730m  BUN/Creatinine Ratio NOT APPLICABLE 6 - 22 (calc)   Sodium 137 135 - 146 mmol/L   Potassium 4.5 3.5 - 5.3 mmol/L   Chloride 103 98 - 110 mmol/L   CO2 24 20 - 32 mmol/L   Calcium 9.2 8.6 - 10.2 mg/dL   Total Protein 7.1 6.1 - 8.1 g/dL   Albumin 3.9 3.6 - 5.1 g/dL   Globulin 3.2 1.9 - 3.7 g/dL (calc)   AG Ratio 1.2 1.0 - 2.5 (calc)   Total Bilirubin  0.5 0.2 - 1.2 mg/dL   Alkaline phosphatase (APISO) 58 31 - 125 U/L   AST 16 10 - 30 U/L   ALT 20 6 - 29 U/L  Sedimentation rate     Status: Abnormal   Collection Time: 01/30/20 12:38 PM  Result Value Ref Range   Sed Rate 29 (H) 0 - 20 mm/h  C-reactive protein     Status: Abnormal   Collection Time: 01/30/20 12:38 PM  Result Value Ref Range   CRP 16.0 (H) <8.0 mg/L  Urine  Culture     Status: None   Collection Time: 01/30/20  2:18 PM   Specimen: Urine  Result Value Ref Range   MICRO NUMBER: [id number]    SPECIMEN QUALITY: Adequate    Sample Source URINE    STATUS: FINAL    ISOLATE 1:      Less than 10,000 CFU/mL of single Gram positive organism isolated. No further testing will be performed. If clinically indicated, recollection using a method to minimize contamination, with prompt transfer to Urine Culture Transport Tube, is recommended.     PHQ2/9: Depression screen [facility] 2/9 03/06/2020 01/30/2020 01/23/2020 01/10/2020 10/31/2019  Decreased Interest 2 1 0 0 1  Down, Depressed, Hopeless 2 1 0 0 1  PHQ - 2 Score 4 2 0 0 2  Altered sleeping 2 - - 0 1  Tired, decreased energy 3 - - 0 1  Change in appetite 1 - - 0 1  Feeling bad or failure about yourself  1 - - 0 1  Trouble concentrating 0 - - 0 0  Moving slowly or fidgety/restless 0 - - 0 0  Suicidal thoughts 0 - - 0 0  PHQ-9 Score 11 - - 0 6  Difficult doing work/chores Somewhat difficult - - Not difficult at all Somewhat difficult  Some recent data might be hidden    phq 9 is positive   Fall Risk: Fall Risk  03/06/2020 02/28/2020 01/30/2020 01/23/2020 01/10/2020  Falls in the past year? 0 0 0 0 0  Number falls in past yr: 0 0 0 0 0  Injury with Fall? 0 0 0 0 0  Comment - - - - -  Follow up - - - Falls evaluation completed -     Functional Status Survey: Is the patient deaf or have difficulty hearing?: No Does the patient have difficulty seeing, even when wearing glasses/contacts?: No Does the patient have difficulty concentrating, remembering, or making decisions?: Yes Does the patient have difficulty walking or climbing stairs?: No Does the patient have difficulty dressing or bathing?: No Does the patient have difficulty doing errands alone such as visiting a doctor's office or shopping?: No   Assessment & Plan  1. COVID-19 long hauler  - amphetamine-dextroamphetamine (ADDERALL XR) 10 MG 24 hr  capsule; Take 1 capsule (10 mg total) by mouth daily.  Dispense: 30 capsule; Refill: 0  We will extend FMLA until January 9 th, 2021 to give her time to have PT, start Adderal and monitor focus and concentration so she can go back to work without making mistakes. She has not stamina and takes naps during the day   2. Moderate recurrent major depression (HCC)  - amphetamine-dextroamphetamine (ADDERALL XR) 10 MG 24 hr capsule; Take 1 capsule (10 mg total) by mouth daily.  Dispense: 30 capsule; Refill: 0 - DULoxetine (CYMBALTA) 30 MG capsule; Take 1 capsule (30 mg total) by mouth daily. First week take one after that two daily  Dispense: 60  capsule; Refill: 0

## 2020-03-06 NOTE — Addendum Note (Signed)
Addended by: [name] F on: 03/06/2020 10:28 AM   Modules accepted: Orders, Level of Service

## 2020-03-06 NOTE — Telephone Encounter (Signed)
Called referring clinician Angus Seller, PA at the [facility] [place]  [phone] and spoke to [place] who took a message. Explained I had seen her for initial PT eval and noted she was having cognitive difficulties and may benefit from a Speech Language Pathology eval and treat and wanted to let Archie Patten know about this in case she would like to refer her for that. Left call back number [phone]  Luretha Murphy. Ilsa Iha, PT, DPT 03/06/20, 10:33 AM

## 2020-03-06 NOTE — Telephone Encounter (Signed)
Catherine Berg from [facility] called on behalf of pt to share that speech language pathology could possibly help pt with her cognitive issues.

## 2020-03-07 ENCOUNTER — Telehealth: Payer: Self-pay | Admitting: Physical Therapy

## 2020-03-07 ENCOUNTER — Ambulatory Visit: Payer: Managed Care, Other (non HMO) | Admitting: Physical Therapy

## 2020-03-07 NOTE — Telephone Encounter (Signed)
Called patient when she did not show up for her 10:30 appointment. Patient answered and said she didn't know she had an appointment today and was expecting her next appointment to be on Monday 11/8. Confirmed her next appointment on 03/11/2020 at 10:30 am.   Catherine Berg. Ilsa Iha, PT, DPT 03/07/20, 10:53 AM

## 2020-03-07 NOTE — Telephone Encounter (Signed)
Please place referral to speech for patient for memory loss and brain fog post covid. Thanks.

## 2020-03-11 ENCOUNTER — Encounter: Payer: Self-pay | Admitting: Physical Therapy

## 2020-03-11 ENCOUNTER — Encounter: Payer: Self-pay | Admitting: Family Medicine

## 2020-03-11 ENCOUNTER — Ambulatory Visit: Payer: Managed Care, Other (non HMO) | Admitting: Physical Therapy

## 2020-03-11 ENCOUNTER — Other Ambulatory Visit: Payer: Self-pay

## 2020-03-11 DIAGNOSIS — M542 Cervicalgia: Secondary | ICD-10-CM

## 2020-03-11 DIAGNOSIS — R262 Difficulty in walking, not elsewhere classified: Secondary | ICD-10-CM

## 2020-03-11 DIAGNOSIS — M6281 Muscle weakness (generalized): Secondary | ICD-10-CM

## 2020-03-11 DIAGNOSIS — R29898 Other symptoms and signs involving the musculoskeletal system: Secondary | ICD-10-CM

## 2020-03-11 NOTE — Therapy (Signed)
[facility] [address] [place], [address] Phone: [phone]   Fax:  [phone]  Physical Therapy Treatment  Patient Details  Name: Catherine Berg MRN: [id number] Date of Birth: [birth year] Referring Provider (PT): Kriste Basque. Nils Pyle, NP   Encounter Date: 03/11/2020   PT End of Session - 03/11/20 1403    Visit Number 2    Number of Visits 24    Date for PT Re-Evaluation 05/28/20    Authorization Type [employer] MANAGED reporting period from 03/05/2020    PT Start Time 1030    PT Stop Time 1115    PT Time Calculation (min) 45 min    Activity Tolerance Patient limited by fatigue    Behavior During Therapy [facility] for tasks assessed/performed           Past Medical History:  Diagnosis Date  . Abnormal thyroid blood test   . Anemia   . Anxiety   . Asthma   . Complication of anesthesia    itching after c sections  . 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 Graves disease   . History of abnormal cervical Pap smear   . History of cervical polypectomy   . Hives 09/02/2015  . Hypertension   . IFG (impaired fasting glucose)   . Incidental lung nodule, > 73m and < 867m11/28/2018   4 mm RLL lung nodule on chest CT Mar 31, 2017  . Low serum vitamin D   . Obesity   . Pregnancy induced hypertension    with 1st pregnancy, normal pressure with 2nd  . Reflux   . Thyroid disease   . Vitamin B12 deficiency   . Vitamin D deficiency disease   . Wears dentures    full upper    Past Surgical History:  Procedure Laterality Date  . CERVICAL POLYPECTOMY    . CESAREAN SECTION     x 2  . COLONOSCOPY WITH PROPOFOL N/A 11/14/2018   Procedure: COLONOSCOPY WITH PROPOFOL;  Surgeon: TaVirgel ManifoldMD;  [place] [facility] Service: [place];  Laterality: N/A;  . ESOPHAGOGASTRODUODENOSCOPY (EGD) WITH PROPOFOL N/A 11/14/2018   Procedure: ESOPHAGOGASTRODUODENOSCOPY (EGD) WITH PROPOFOL;   Surgeon: TaVirgel ManifoldMD;  [place] [facility] Service: [place];  Laterality: N/A;  Latex allergy  . GIVENS CAPSULE STUDY N/A 12/27/2018   Procedure: GIVENS CAPSULE STUDY;  Surgeon: TaVirgel ManifoldMD;  Location: [facility] ENDOSCOPY;  Service: Endoscopy;  Laterality: N/A;    There were no vitals filed for this visit.   Subjective Assessment - 03/11/20 1033    Subjective Patient reports she has no pain today and she feels a little tired today. She slept all day the day following her last PT session and stayed in bed until 7 pm. Then she took a shower and went back to bed. She felt a bit out of it the day after that but more normal for her. She states her daughter said "she didn't have the day together" the 2nd day folloiwng. The 3rd day following (friday), she felt more normal for her now and she went to her son's game.   It takes her all weekend to recover from going to the game. She feels really tired and drained. Does get a cough and sore throat. She also feels drained after doing cognitive activities. She could not remember the name of her company's website.   Pertinent History Patient is a [age].o.  female who presents to outpatient physical therapy with a referral for medical diagnosis history of COVID-19, physical deconditioning. This patient's chief complaints consist of decreased activity tolerance, fatigue, poor endurance, and difficulty with cognition leading to the following functional deficits: difficulty with all aspects of her life. Unable to work, difficulty with ADLs, dressing, showering, washing the clothes, caring for her children, household and community ambulation, IADLs, difficulty with decision making and tasks requring executive function. Relevant past medical history and comorbidities include hypertension, graves disease, anemia, asthma, leg pain (no dx, takes lyrica), hand pain (went to see if she had RA, but said she did not).  Patient denies hx of cancer,  stroke, seizures, major cardiac events, diabetes, unexplained weight loss, changes in bowel or bladder problems, new onset stumbling or dropping things.    How long can you walk comfortably? 15 minutes    Patient Stated Goals "get back to normal"    Currently in Pain? No/denies    Pain Onset More than a month ago               [facility] PT Assessment - 03/11/20 0001      Assessment   Medical Diagnosis history of COVID-19, physical deconditioning    Referring Provider (PT) Kriste Basque. Nils Pyle, NP    Onset Date/Surgical Date 01/10/20    Hand Dominance Right    Next MD Visit 12/21/20201    Prior Therapy none for this condition prior to current episode of care      Balance Screen   Has the patient fallen in the past 6 months No    Has the patient had a decrease in activity level because of a fear of falling?  No    Is the patient reluctant to leave their home because of a fear of falling?  No      Prior Function   Level of Independence Independent    Vocation Full time employment    Vocation Requirements standing, walking, computer work, Therapist, art, making schedules    Leisure 74 and 40 year old      Cognition   Overall Cognitive Status Impaired/Different from baseline            Stated most pressing need: Move around more without being so tired. A year ago she could walk 4 miles a day for exercise. 2x 2 miles each day (morning and evening). Increase endurance.   TREATMENT:   Therapeutic exercise: to centralize symptoms and improve ROM, strength, muscular endurance, and activity tolerance required for successful completion of functional activities.  - 10 meter walk test: 7 seconds before and after 6MWT.  - 6MWT: 1118 feet, no AD. Full time. Ending vitals: HR 94 bpm , SpO2 100% - 30 second sit <> stand: 11 from 18 inch chair with no UE support,  Vitals immediately after: HR114 bpm: 98%.  - 2 min walk self selected pace (started to really drag at 1:35 so discontinued).  - 2  min break (not feeling rested at 1 minute) 3 cycles:  - 1 min walk self selected pace - 1 min break   - Education on HEP including handout and discussion of pacing and energy conservation recommendations. Provided resources about Long COVID 19 study at [facility].    HOME EXERCISE PROGRAM Access Code: [email or web address] URL: [email or web address] Date: 03/11/2020 Prepared by: [name]  Program Notes   Try to complete daily tasks in 2 minute intervals with 1-2 min seated rest every 2 minutes. This will be annoying  and a challenge.   Try to track how your energy feels overall throughout the day if you complete your tasks this way versus just doing each task non-stop until it is complete.   Get a chair to sit on in the shower to decrease energy cost while bathing.   Exercises Walking 5 times 1 min walk + 1 min seated rest     PT Education - 03/11/20 1405    Education Details Exercise purpose/form. Self-management techniques. Education on HEP including handout and discussion of pacing and energy conservation recommendations. Provided resources about Long COVID 19 study at [facility].    Person(s) Educated Patient    Methods Demonstration;Explanation;Tactile cues;Verbal cues;Handout    Comprehension Verbalized understanding;Returned demonstration;Verbal cues required;Tactile cues required;Need further instruction            PT Short Term Goals - 03/05/20 1838      PT SHORT TERM GOAL #1   Title Be independent with initial home exercise program for self-management of symptoms.    Baseline introduced pacing (03/05/2020);    Time 3    Period Weeks    Status New    Target Date 03/26/20             PT Long Term Goals - 03/05/20 1839      PT LONG TERM GOAL #1   Title Be independent with a long-term home exercise program for self-management of symptoms.    Baseline introduced pacing (03/05/2020);    Time 12    Period Weeks    Status New   TARGET DATE FOR  ALL LONG TERM GOALS: 05/28/2020     PT LONG TERM GOAL #2   Title Demonstrate improved FOTO score to 65 by visit #10 demonstrate improvement in overall condition and self-reported functional ability.    Baseline 51 (03/05/2020);    Time 12    Period Weeks    Status New      PT LONG TERM GOAL #3   Title Patient will improve 6 minute walk test to equal or greater than 1400 feet with no assistive device to demonstrate improved community ambulation for improved abliity to return to work (03/05/2020);    Baseline 800 feet and unable to walk longer than 5 minutes (03/05/2020);    Time 12    Period Weeks    Status New      PT LONG TERM GOAL #4   Title Pateint will complete 5 Time Sit To Stand Test in equal or less than 12 seconds from 18.5 inch surface with no UE support to demonstrate improved LE muscle performance to improve ability to perform household chores and work activities    Baseline 17 seconds from 18.5 inch plinth, no UE support (03/05/2020);    Time 12    Period Weeks    Status New    Target Date 05/28/20      PT LONG TERM GOAL #5   Title Complete community, work and/or recreational activities without limitation due to current condition    Baseline difficulty with all aspects of her life. Unable to work, difficulty with ADLs, dressing, showering, washing the clothes, caring for her children, household and community ambulation, IADLs, difficulty with decision making and executive function tasks (03/05/2020);    Time 12    Period Weeks    Status New                 Plan - 03/11/20 1400    Clinical Impression Statement Patient tolerated treatment  with some limitation due to quick fatigue. Patient did not have any difference in walking speed at start and completion of 6 Minute Walk Test and she was able to ambulate the full 6 minutes today. Does report symptoms consistent with post exertional malaise and will continue to monitor at subsequent sessions. Shifted focus to repeated  short bouts of activity with significant rest between to improve activity tolerance and reinforce concept of pacing for improved overall function. Patient appeared to handle 1 min of activity at a time fairly well but struggled to get to 2 minutes without strong feeling of fatigue. Patient would benefit from continued management of limiting condition by skilled physical therapist to address remaining impairments and functional limitations to work towards stated goals and return to PLOF or maximal functional independence.    Personal Factors and Comorbidities Age;Comorbidity 3+;Time since onset of injury/illness/exacerbation;Past/Current Experience;Fitness    Comorbidities Relevant past medical history and comorbidities include hypertension, graves disease, anemia, asthma, leg pain (no dx, takes lyrica), hand pain (went to see if she had RA, but said she did not).    Examination-Activity Limitations Bathing;Hygiene/Grooming;Bed Mobility;Lift;Stairs;Locomotion Level;Stand;Caring for Others;Carry;Dressing    Examination-Participation Restrictions Laundry;Cleaning;Shop;Community Activity;Meal Prep;Driving;Occupation;Yard Work;Interpersonal Relationship   difficulty with all aspects of her life. Unable to work, difficulty with ADLs, dressing, showering, washing the clothes, caring for her children, household and community ambulation, IADLs, difficulty with decision making and executive function tasks.   Stability/Clinical Decision Making Evolving/Moderate complexity    Rehab Potential Good    PT Frequency 2x / week    PT Duration 12 weeks    PT Treatment/Interventions ADLs/Self Care Home Management;Aquatic Therapy;Moist Heat;Cryotherapy;Therapeutic activities;Therapeutic exercise;Neuromuscular re-education;Cognitive remediation;Patient/family education;Manual techniques;Dry needling;Passive range of motion;Energy conservation;Spinal Manipulations;Joint Manipulations    PT Next Visit Plan pacing, low rep, high  resistance strength training as tolerated, low duration endurance intervals    PT Home Exercise Plan Medbridge Access Code: [email or web address]    Consulted and Agree with Plan of Care Patient           Patient will benefit from skilled therapeutic intervention in order to improve the following deficits and impairments:  Decreased cognition, Impaired sensation, Pain, Cardiopulmonary status limiting activity, Decreased mobility, Impaired perceived functional ability, Decreased strength, Decreased range of motion, Decreased endurance, Decreased activity tolerance, Difficulty walking, Obesity  Visit Diagnosis: Muscle weakness (generalized)  Other symptoms and signs involving the musculoskeletal system  Difficulty in walking, not elsewhere classified  Cervicalgia     Problem List Patient Active Problem List   Diagnosis Date Noted  . History of COVID-19 02/28/2020  . 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 Memory loss 02/28/2020  . Physical deconditioning 02/28/2020  . Dyspnea on exertion 02/28/2020  . Neck pain, bilateral 02/28/2020  . Muscle spasms of neck 02/28/2020  . Gallstones 01/30/2020  . COVID-19 01/23/2020  . Arthralgia of hand 01/24/2019  . CRP elevated 01/24/2019  . ESR raised 01/24/2019  . Class 3 severe obesity due to excess calories without serious comorbidity in adult ([place]) 01/24/2019  . Iron deficiency anemia   . Palpitations 09/16/2018  . Coronary artery calcification 09/16/2018  . 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 Bilateral leg edema 04/20/2017  . Incidental lung nodule, > 32m and < 835m11/28/2018  . Hives 09/02/2015  . [place]  . Anxiety 07/09/2015  . 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 Thyroid nodule 02/01/2015  . Vitamin D deficiency disease   . Vitamin B12 deficiency   . History of abnormal cervical Pap smear   .  Allergy-induced asthma, mild intermittent, uncomplicated 27/87/1836    Everlean Alstrom. Graylon Good, PT, DPT 03/11/20, 2:06  PM  [facility] [address], [place], [address] Phone: [phone]   Fax:  [phone]  Name: Catherine Berg MRN: [id number] Date of Birth: 1980-10-22

## 2020-03-13 ENCOUNTER — Ambulatory Visit: Payer: Managed Care, Other (non HMO) | Admitting: Physical Therapy

## 2020-03-13 ENCOUNTER — Other Ambulatory Visit: Payer: Self-pay

## 2020-03-13 ENCOUNTER — Encounter: Payer: Self-pay | Admitting: Physical Therapy

## 2020-03-13 DIAGNOSIS — M6281 Muscle weakness (generalized): Secondary | ICD-10-CM

## 2020-03-13 DIAGNOSIS — R262 Difficulty in walking, not elsewhere classified: Secondary | ICD-10-CM

## 2020-03-13 DIAGNOSIS — M542 Cervicalgia: Secondary | ICD-10-CM

## 2020-03-13 DIAGNOSIS — R29898 Other symptoms and signs involving the musculoskeletal system: Secondary | ICD-10-CM

## 2020-03-13 NOTE — Therapy (Signed)
[facility] [address], [place], [address] Phone: [phone]   Fax:  [phone]  Physical Therapy Treatment  Patient Details  Name: Catherine Berg MRN: [id number] Date of Birth: 1980/10/15 Referring Provider (PT): Kriste Basque. Nils Pyle, NP   Encounter Date: 03/13/2020   PT End of Session - 03/13/20 1911    Visit Number 3    Number of Visits 24    Date for PT Re-Evaluation 05/28/20    Authorization Type [employer] MANAGED reporting period from 03/05/2020    PT Start Time 1602    PT Stop Time 1628    PT Time Calculation (min) 26 min    Activity Tolerance Patient limited by fatigue    Behavior During Therapy [facility] for tasks assessed/performed           Past Medical History:  Diagnosis Date  . Abnormal thyroid blood test   . Anemia   . Anxiety   . Asthma   . Complication of anesthesia    itching after c sections  . 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 Graves disease   . History of abnormal cervical Pap smear   . History of cervical polypectomy   . Hives 09/02/2015  . Hypertension   . IFG (impaired fasting glucose)   . Incidental lung nodule, > 4m and < 867m11/28/2018   4 mm RLL lung nodule on chest CT Mar 31, 2017  . Low serum vitamin D   . Obesity   . Pregnancy induced hypertension    with 1st pregnancy, normal pressure with 2nd  . Reflux   . Thyroid disease   . Vitamin B12 deficiency   . Vitamin D deficiency disease   . Wears dentures    full upper    Past Surgical History:  Procedure Laterality Date  . CERVICAL POLYPECTOMY    . CESAREAN SECTION     x 2  . COLONOSCOPY WITH PROPOFOL N/A 11/14/2018   Procedure: COLONOSCOPY WITH PROPOFOL;  Surgeon: TaVirgel ManifoldMD;  [place] [facility] Service: End[facility];  Laterality: N/A;  . ESOPHAGOGASTRODUODENOSCOPY (EGD) WITH PROPOFOL N/A 11/14/2018   Procedure: ESOPHAGOGASTRODUODENOSCOPY (EGD) WITH PROPOFOL;   Surgeon: TaVirgel ManifoldMD;  [place] [facility] Service: Endoscopy;  Laterality: N/A;  Latex allergy  . GIVENS CAPSULE STUDY N/A 12/27/2018   Procedure: GIVENS CAPSULE STUDY;  Surgeon: TaVirgel ManifoldMD;  Location: [facility] ENDOSCOPY;  Service: Endoscopy;  Laterality: N/A;    There were no vitals filed for this visit.   Subjective Assessment - 03/13/20 1602    Subjective Patient reports she was pretty tired yesterday (the day after PT). She stayed in the bed most of yesterday except to take the kids to school and pick them up. No pain today. She has been having a few headaches. Fatigue is 7/10 with 10 being most severe. Reports her memory is poor today and she missed the PT clinic and had to turn around and drive back. She cannot remember how her memory was yesterday because she can't remember    Pertinent History Patient is a [age].o. female who presents to outpatient physical therapy with a referral for medical diagnosis history of COVID-19, physical deconditioning. This patient's chief complaints consist of decreased activity tolerance, fatigue, poor endurance, and difficulty with cognition leading to the following functional deficits: difficulty with all aspects of her life. Unable to work, difficulty with ADLs, dressing, showering, washing  the clothes, caring for her children, household and community ambulation, IADLs, difficulty with decision making and tasks requring executive function. Relevant past medical history and comorbidities include hypertension, graves disease, anemia, asthma, leg pain (no dx, takes lyrica), hand pain (went to see if she had RA, but said she did not).  Patient denies hx of cancer, stroke, seizures, major cardiac events, diabetes, unexplained weight loss, changes in bowel or bladder problems, new onset stumbling or dropping things.    How long can you walk comfortably? 15 minutes    Patient Stated Goals "get back to normal"    Pain Onset More than a  month ago            Stated most pressing need: Move around more without being so tired. A year ago she could walk 4 miles a day for exercise. 2x 2 miles each day (morning and evening). Increase endurance.   TREATMENT:   Therapeutic exercise:to centralize symptoms and improve ROM, strength, muscular endurance, and activity tolerance required for successful completion of functional activities.   5 cycles:  - 1 min walk self selected pace - 1 min break   5 cycles: - 5 sit <> stand from 18 inch chair with no UE support.  - 1 min break  - seated long arc quad, 3x10 with 1 min break in between each set.   HOME EXERCISE PROGRAM Access Code: [email or web address] URL: [email or web address] Date: 03/11/2020 Prepared by: [name]  Program Notes   Try to complete daily tasks in 2 minute intervals with 1-2 min seated rest every 2 minutes. This will be annoying and a challenge.   Try to track how your energy feels overall throughout the day if you complete your tasks this way versus just doing each task non-stop until it is complete.   Get a chair to sit on in the shower to decrease energy cost while bathing.   Exercises Walking 5 times 1 min walk + 1 min seated rest     PT Education - 03/13/20 1910    Education Details Exercise purpose/form. Self-management techniques    Person(s) Educated Patient    Methods Explanation;Demonstration;Tactile cues;Verbal cues    Comprehension Verbalized understanding;Returned demonstration;Verbal cues required;Tactile cues required;Need further instruction            PT Short Term Goals - 03/05/20 1838      PT SHORT TERM GOAL #1   Title Be independent with initial home exercise program for self-management of symptoms.    Baseline introduced pacing (03/05/2020);    Time 3    Period Weeks    Status New    Target Date 03/26/20             PT Long Term Goals - 03/05/20 1839      PT LONG TERM GOAL #1   Title Be  independent with a long-term home exercise program for self-management of symptoms.    Baseline introduced pacing (03/05/2020);    Time 12    Period Weeks    Status New   TARGET DATE FOR ALL LONG TERM GOALS: 05/28/2020     PT LONG TERM GOAL #2   Title Demonstrate improved FOTO score to 65 by visit #10 demonstrate improvement in overall condition and self-reported functional ability.    Baseline 51 (03/05/2020);    Time 12    Period Weeks    Status New      PT LONG TERM GOAL #3   Title Patient will improve 6 minute walk  test to equal or greater than 1400 feet with no assistive device to demonstrate improved community ambulation for improved abliity to return to work (03/05/2020);    Baseline 800 feet and unable to walk longer than 5 minutes (03/05/2020);    Time 12    Period Weeks    Status New      PT LONG TERM GOAL #4   Title Pateint will complete 5 Time Sit To Stand Test in equal or less than 12 seconds from 18.5 inch surface with no UE support to demonstrate improved LE muscle performance to improve ability to perform household chores and work activities    Baseline 17 seconds from 18.5 inch plinth, no UE support (03/05/2020);    Time 12    Period Weeks    Status New    Target Date 05/28/20      PT LONG TERM GOAL #5   Title Complete community, work and/or recreational activities without limitation due to current condition    Baseline difficulty with all aspects of her life. Unable to work, difficulty with ADLs, dressing, showering, washing the clothes, caring for her children, household and community ambulation, IADLs, difficulty with decision making and executive function tasks (03/05/2020);    Time 12    Period Weeks    Status New                 Plan - 03/13/20 1916    Clinical Impression Statement Patient reported feeling quite fatigued throughout the session.  Pacing and intervals were used throughout session to allow patient to work on improved activity tolerance with  sufficient rest times and only short bouts of activity in case of aerobic dysfunction.  We will continue to monitor response to treatment over the next few days, and see if it differs from following previous sessions with longer bouts of exercise where patient reports feeling significantly impaired with mobility fatigue and cognitive function the day following physical therapy.  At this point patient does appear to suffer from post exertional malaise.Patient would benefit from continued management of limiting condition by skilled physical therapist to address remaining impairments and functional limitations to work towards stated goals and return to PLOF or maximal functional independence.    Personal Factors and Comorbidities Age;Comorbidity 3+;Time since onset of injury/illness/exacerbation;Past/Current Experience;Fitness    Comorbidities Relevant past medical history and comorbidities include hypertension, graves disease, anemia, asthma, leg pain (no dx, takes lyrica), hand pain (went to see if she had RA, but said she did not).    Examination-Activity Limitations Bathing;Hygiene/Grooming;Bed Mobility;Lift;Stairs;Locomotion Level;Stand;Caring for Others;Carry;Dressing    Examination-Participation Restrictions Laundry;Cleaning;Shop;Community Activity;Meal Prep;Driving;Occupation;Yard Work;Interpersonal Relationship   difficulty with all aspects of her life. Unable to work, difficulty with ADLs, dressing, showering, washing the clothes, caring for her children, household and community ambulation, IADLs, difficulty with decision making and executive function tasks.   Stability/Clinical Decision Making Evolving/Moderate complexity    Rehab Potential Good    PT Frequency 2x / week    PT Duration 12 weeks    PT Treatment/Interventions ADLs/Self Care Home Management;Aquatic Therapy;Moist Heat;Cryotherapy;Therapeutic activities;Therapeutic exercise;Neuromuscular re-education;Cognitive remediation;Patient/family  education;Manual techniques;Dry needling;Passive range of motion;Energy conservation;Spinal Manipulations;Joint Manipulations    PT Next Visit Plan pacing, low rep, high resistance strength training as tolerated, low duration endurance intervals    PT Home Exercise Plan Medbridge Access Code: [email or web address]    Consulted and Agree with Plan of Care Patient           Patient will benefit from skilled therapeutic intervention in  order to improve the following deficits and impairments:  Decreased cognition, Impaired sensation, Pain, Cardiopulmonary status limiting activity, Decreased mobility, Impaired perceived functional ability, Decreased strength, Decreased range of motion, Decreased endurance, Decreased activity tolerance, Difficulty walking, Obesity  Visit Diagnosis: Muscle weakness (generalized)  Other symptoms and signs involving the musculoskeletal system  Difficulty in walking, not elsewhere classified  Cervicalgia     Problem List Patient Active Problem List   Diagnosis Date Noted  . History of COVID-19 02/28/2020  . 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 Memory loss 02/28/2020  . Physical deconditioning 02/28/2020  . Dyspnea on exertion 02/28/2020  . Neck pain, bilateral 02/28/2020  . Muscle spasms of neck 02/28/2020  . Gallstones 01/30/2020  . COVID-19 01/23/2020  . Arthralgia of hand 01/24/2019  . CRP elevated 01/24/2019  . ESR raised 01/24/2019  . Class 3 severe obesity due to excess calories without serious comorbidity in adult ([place]) 01/24/2019  . Iron deficiency anemia   . Palpitations 09/16/2018  . Coronary artery calcification 09/16/2018  . 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 Bilateral leg edema 04/20/2017  . Incidental lung nodule, > 56m and < 837m11/28/2018  . Hives 09/02/2015  . Morbid obesity ([place]/13/2017  . Anxiety 07/09/2015  . 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 Thyroid nodule 02/01/2015  . Vitamin D deficiency disease   . Vitamin B12  deficiency   . History of abnormal cervical Pap smear   . Allergy-induced asthma, mild intermittent, uncomplicated 0628/00/3491 SaEverlean AlstromSnGraylon GoodPT, DPT 03/13/20, 7:17 PM  [facility] [address]hone: [phone] Fax:  [phone]: TaFADUMO HENGRN: [id number] of Birth: 01/07/07/1982

## 2020-03-19 ENCOUNTER — Other Ambulatory Visit: Payer: Self-pay

## 2020-03-19 ENCOUNTER — Ambulatory Visit: Payer: Managed Care, Other (non HMO) | Admitting: Physical Therapy

## 2020-03-19 DIAGNOSIS — R29898 Other symptoms and signs involving the musculoskeletal system: Secondary | ICD-10-CM

## 2020-03-19 DIAGNOSIS — M542 Cervicalgia: Secondary | ICD-10-CM

## 2020-03-19 DIAGNOSIS — M6281 Muscle weakness (generalized): Secondary | ICD-10-CM | POA: Diagnosis not present

## 2020-03-19 DIAGNOSIS — R262 Difficulty in walking, not elsewhere classified: Secondary | ICD-10-CM

## 2020-03-19 NOTE — Therapy (Signed)
[facility] [address] [place], [address] Phone: [phone]   Fax:  [phone]  Physical Therapy Treatment  Patient Details  Name: Catherine Berg MRN: [id number] Date of Birth: Oct 21, 1980 Referring Provider (PT): Kriste Basque. Nils Pyle, NP   Encounter Date: 03/19/2020   PT End of Session - 03/19/20 1013    Visit Number 4    Number of Visits 24    Date for PT Re-Evaluation 05/28/20    Authorization Type [employer] MANAGED reporting period from 03/05/2020    PT Start Time 0948    PT Stop Time 1028    PT Time Calculation (min) 40 min    Activity Tolerance Patient limited by fatigue    Behavior During Therapy [facility] for tasks assessed/performed           Past Medical History:  Diagnosis Date  . Abnormal thyroid blood test   . Anemia   . Anxiety   . Asthma   . Complication of anesthesia    itching after c sections  . 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 Graves disease   . History of abnormal cervical Pap smear   . History of cervical polypectomy   . Hives 09/02/2015  . Hypertension   . IFG (impaired fasting glucose)   . Incidental lung nodule, > 48m and < 813m11/28/2018   4 mm RLL lung nodule on chest CT Mar 31, 2017  . Low serum vitamin D   . Obesity   . Pregnancy induced hypertension    with 1st pregnancy, normal pressure with 2nd  . Reflux   . Thyroid disease   . Vitamin B12 deficiency   . Vitamin D deficiency disease   . Wears dentures    full upper    Past Surgical History:  Procedure Laterality Date  . CERVICAL POLYPECTOMY    . CESAREAN SECTION     x 2  . COLONOSCOPY WITH PROPOFOL N/A 11/14/2018   Procedure: COLONOSCOPY WITH PROPOFOL;  Surgeon: TaVirgel ManifoldMD;  [place] [facility] Service: [place];  Laterality: N/A;  . ESOPHAGOGASTRODUODENOSCOPY (EGD) WITH PROPOFOL N/A 11/14/2018   Procedure: ESOPHAGOGASTRODUODENOSCOPY (EGD) WITH PROPOFOL;   Surgeon: TaVirgel ManifoldMD;  [place] [facility] Service: End[place];  Laterality: N/A;  Latex allergy  . GIVENS CAPSULE STUDY N/A 12/27/2018   Procedure: GIVENS CAPSULE STUDY;  Surgeon: TaVirgel ManifoldMD;  Location: [facility] ENDOSCOPY;  Service: Endoscopy;  Laterality: N/A;    There were no vitals filed for this visit.   Subjective Assessment - 03/19/20 0947    Subjective Pateint reports her fatigue level is about 3/10 (where 10 is the worst) and her cognition seems okay so far. Felt more tired the day after last session compared to prior sessions and attributed that to the session being later in the day. No pain today. Patient has not been doing much at home and letting her kids do the household things. She hasn't been sleeping well at night. She has been going to sleep but then waking up and can't sleep. She has been avoiding sleeping during the day. She has has been getting maybe 5 hours of sleep during the night.    Pertinent History Patient is a [age].o. female who presents to outpatient physical therapy with a referral for medical diagnosis history of COVID-19, physical deconditioning. This patient's chief complaints consist of decreased activity tolerance, fatigue, poor endurance, and difficulty with  cognition leading to the following functional deficits: difficulty with all aspects of her life. Unable to work, difficulty with ADLs, dressing, showering, washing the clothes, caring for her children, household and community ambulation, IADLs, difficulty with decision making and tasks requring executive function. Relevant past medical history and comorbidities include hypertension, graves disease, anemia, asthma, leg pain (no dx, takes lyrica), hand pain (went to see if she had RA, but said she did not).  Patient denies hx of cancer, stroke, seizures, major cardiac events, diabetes, unexplained weight loss, changes in bowel or bladder problems, new onset stumbling or dropping things.     How long can you walk comfortably? 15 minutes    Patient Stated Goals "get back to normal"    Currently in Pain? No/denies    Pain Onset --          Stated most pressing need: Move around more without being so tired. A year ago she could walk 4 miles a day for exercise. 2x 2 miles each day (morning and evening). Increase endurance.  TREATMENT:  Therapeutic exercise:to centralize symptoms and improve ROM, strength, muscular endurance, and activity tolerance required for successful completion of functional activities.  5 cycles:  - 1 min walk self selected pace - 1 min break  5 cycles: I,I,I,I,I  - 5 sit <> stand from 18 inch chair with no UE support.  - 1 min break  - seated long arc quad, 10#, 3x10 with 1 min break in between each set.  - hooklying bridge, 3x5 with 1 min break between each set.  - seated row with 10# cable, 3x10 with 1 min break between each set - seated bicep curl with 5# DB in each hand, 2x10, with 1 min break between each set.    HOME EXERCISE PROGRAM Access Code: [email or web address] URL: [email or web address] Date: 03/11/2020 Prepared by: [name]  Program Notes   Try to complete daily tasks in 2 minute intervals with 1-2 min seated rest every 2 minutes. This will be annoying and a challenge.   Try to track how your energy feels overall throughout the day if you complete your tasks this way versus just doing each task non-stop until it is complete.   Get a chair to sit on in the shower to decrease energy cost while bathing.   Exercises Walking5 times 1 min walk + 1 min seated rest     PT Education - 03/19/20 0948    Education Details Exercise purpose/form. Self-management techniques    Person(s) Educated Patient    Methods Explanation;Demonstration;Tactile cues;Verbal cues    Comprehension Verbalized understanding;Returned demonstration;Verbal cues required;Tactile cues required;Need further instruction             PT Short Term Goals - 03/05/20 1838      PT SHORT TERM GOAL #1   Title Be independent with initial home exercise program for self-management of symptoms.    Baseline introduced pacing (03/05/2020);    Time 3    Period Weeks    Status New    Target Date 03/26/20             PT Long Term Goals - 03/05/20 1839      PT LONG TERM GOAL #1   Title Be independent with a long-term home exercise program for self-management of symptoms.    Baseline introduced pacing (03/05/2020);    Time 12    Period Weeks    Status New   TARGET DATE FOR ALL LONG TERM GOALS: 05/28/2020  PT LONG TERM GOAL #2   Title Demonstrate improved FOTO score to 65 by visit #10 demonstrate improvement in overall condition and self-reported functional ability.    Baseline 51 (03/05/2020);    Time 12    Period Weeks    Status New      PT LONG TERM GOAL #3   Title Patient will improve 6 minute walk test to equal or greater than 1400 feet with no assistive device to demonstrate improved community ambulation for improved abliity to return to work (03/05/2020);    Baseline 800 feet and unable to walk longer than 5 minutes (03/05/2020);    Time 12    Period Weeks    Status New      PT LONG TERM GOAL #4   Title Pateint will complete 5 Time Sit To Stand Test in equal or less than 12 seconds from 18.5 inch surface with no UE support to demonstrate improved LE muscle performance to improve ability to perform household chores and work activities    Baseline 17 seconds from 18.5 inch plinth, no UE support (03/05/2020);    Time 12    Period Weeks    Status New    Target Date 05/28/20      PT LONG TERM GOAL #5   Title Complete community, work and/or recreational activities without limitation due to current condition    Baseline difficulty with all aspects of her life. Unable to work, difficulty with ADLs, dressing, showering, washing the clothes, caring for her children, household and community ambulation, IADLs,  difficulty with decision making and executive function tasks (03/05/2020);    Time 12    Period Weeks    Status New                 Plan - 03/19/20 1029    Clinical Impression Statement Patient tolerated treatment with difficulty due to fatigue but was eager to participate and was able to complete more activities this session. Focused on endurance and strengthening exercises in tolerated intervals to allow for sufficient recovery between sets. Patient would benefit from continued management of limiting condition by skilled physical therapist to address remaining impairments and functional limitations to work towards stated goals and return to PLOF or maximal functional independence.    Personal Factors and Comorbidities Age;Comorbidity 3+;Time since onset of injury/illness/exacerbation;Past/Current Experience;Fitness    Comorbidities Relevant past medical history and comorbidities include hypertension, graves disease, anemia, asthma, leg pain (no dx, takes lyrica), hand pain (went to see if she had RA, but said she did not).    Examination-Activity Limitations Bathing;Hygiene/Grooming;Bed Mobility;Lift;Stairs;Locomotion Level;Stand;Caring for Others;Carry;Dressing    Examination-Participation Restrictions Laundry;Cleaning;Shop;Community Activity;Meal Prep;Driving;Occupation;Yard Work;Interpersonal Relationship   difficulty with all aspects of her life. Unable to work, difficulty with ADLs, dressing, showering, washing the clothes, caring for her children, household and community ambulation, IADLs, difficulty with decision making and executive function tasks.   Stability/Clinical Decision Making Evolving/Moderate complexity    Rehab Potential Good    PT Frequency 2x / week    PT Duration 12 weeks    PT Treatment/Interventions ADLs/Self Care Home Management;Aquatic Therapy;Moist Heat;Cryotherapy;Therapeutic activities;Therapeutic exercise;Neuromuscular re-education;Cognitive  remediation;Patient/family education;Manual techniques;Dry needling;Passive range of motion;Energy conservation;Spinal Manipulations;Joint Manipulations    PT Next Visit Plan pacing, low rep, high resistance strength training as tolerated, low duration endurance intervals    PT Home Exercise Plan Medbridge Access Code: [email or web address]    Consulted and Agree with Plan of Care Patient           Patient  will benefit from skilled therapeutic intervention in order to improve the following deficits and impairments:  Decreased cognition, Impaired sensation, Pain, Cardiopulmonary status limiting activity, Decreased mobility, Impaired perceived functional ability, Decreased strength, Decreased range of motion, Decreased endurance, Decreased activity tolerance, Difficulty walking, Obesity  Visit Diagnosis: Muscle weakness (generalized)  Other symptoms and signs involving the musculoskeletal system  Difficulty in walking, not elsewhere classified  Cervicalgia     Problem List Patient Active Problem List   Diagnosis Date Noted  . History of COVID-19 02/28/2020  . 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 Memory loss 02/28/2020  . Physical deconditioning 02/28/2020  . Dyspnea on exertion 02/28/2020  . Neck pain, bilateral 02/28/2020  . Muscle spasms of neck 02/28/2020  . Gallstones 01/30/2020  . COVID-19 01/23/2020  . Arthralgia of hand 01/24/2019  . CRP elevated 01/24/2019  . ESR raised 01/24/2019  . Class 3 severe obesity due to excess calories without serious comorbidity in adult ([place]) 01/24/2019  . Iron deficiency anemia   . Palpitations 09/16/2018  . Coronary artery calcification 09/16/2018  . 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 Bilateral leg edema 04/20/2017  . Incidental lung nodule, > 25m and < 845m11/28/2018  . Hives 09/02/2015  . Morbid obesity ([place]04/13/2017  . Anxiety 07/09/2015  . 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 Thyroid nodule 02/01/2015  . Vitamin D deficiency  disease   . Vitamin B12 deficiency   . History of abnormal cervical Pap smear   . Allergy-induced asthma, mild intermittent, uncomplicated 0636/68/1594  SaEverlean AlstromSnGraylon GoodPT, DPT 03/19/20, 10:34 AM  [facility] [id number]: [phone] Fax:  [phone]: Catherine Berg: [id number] of Birth: 9/04-08-1980

## 2020-03-20 ENCOUNTER — Encounter: Payer: Managed Care, Other (non HMO) | Admitting: Physical Therapy

## 2020-03-21 ENCOUNTER — Other Ambulatory Visit: Payer: Self-pay

## 2020-03-21 ENCOUNTER — Ambulatory Visit: Payer: Managed Care, Other (non HMO) | Admitting: Physical Therapy

## 2020-03-21 DIAGNOSIS — M542 Cervicalgia: Secondary | ICD-10-CM

## 2020-03-21 DIAGNOSIS — R262 Difficulty in walking, not elsewhere classified: Secondary | ICD-10-CM

## 2020-03-21 DIAGNOSIS — R29898 Other symptoms and signs involving the musculoskeletal system: Secondary | ICD-10-CM

## 2020-03-21 DIAGNOSIS — M6281 Muscle weakness (generalized): Secondary | ICD-10-CM

## 2020-03-21 NOTE — Therapy (Signed)
[facility] [address] [place], [address] Phone: [phone]   Fax:  [phone]  Physical Therapy Treatment  Patient Details  Name: Catherine Berg MRN: [id number] Date of Birth: 06-May-1980 Referring Provider (PT): Kriste Basque. Nils Pyle, NP   Encounter Date: 03/21/2020   PT End of Session - 03/21/20 1045    Visit Number 5    Number of Visits 24    Date for PT Re-Evaluation 05/28/20    Authorization Type [employer] MANAGED reporting period from 03/05/2020    PT Start Time 1043    PT Stop Time 1121    PT Time Calculation (min) 38 min    Activity Tolerance Patient limited by fatigue    Behavior During Therapy [facility] for tasks assessed/performed           Past Medical History:  Diagnosis Date  . Abnormal thyroid blood test   . Anemia   . Anxiety   . Asthma   . Complication of anesthesia    itching after c sections  . 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 Graves disease   . History of abnormal cervical Pap smear   . History of cervical polypectomy   . Hives 09/02/2015  . Hypertension   . IFG (impaired fasting glucose)   . Incidental lung nodule, > 25m and < 873m11/28/2018   4 mm RLL lung nodule on chest CT Mar 31, 2017  . Low serum vitamin D   . Obesity   . Pregnancy induced hypertension    with 1st pregnancy, normal pressure with 2nd  . Reflux   . Thyroid disease   . Vitamin B12 deficiency   . Vitamin D deficiency disease   . Wears dentures    full upper    Past Surgical History:  Procedure Laterality Date  . CERVICAL POLYPECTOMY    . CESAREAN SECTION     x 2  . COLONOSCOPY WITH PROPOFOL N/A 11/14/2018   Procedure: COLONOSCOPY WITH PROPOFOL;  Surgeon: TaVirgel ManifoldMD;  [place];  Laterality: N/A;  . ESOPHAGOGASTRODUODENOSCOPY (EGD) WITH PROPOFOL N/A 11/14/2018   Procedure: ESOPHAGOGASTRODUODENOSCOPY (EGD) WITH PROPOFOL;   Surgeon: TaVirgel ManifoldMD;  [place];  Laterality: N/A;  Latex allergy  . GIVENS CAPSULE STUDY N/A 12/27/2018   Procedure: GIVENS CAPSULE STUDY;  Surgeon: TaVirgel ManifoldMD;  Location: [facility] ENDOSCOPY;  Service: Endoscopy;  Laterality: N/A;    There were no vitals filed for this visit.    Stated most pressing need: Move around more without being so tired. A year ago she could walk 4 miles a day for exercise. 2x 2 miles each day (morning and evening). Increase endurance.  TREATMENT:  Therapeutic exercise:to centralize symptoms and improve ROM, strength, muscular endurance, and activity tolerance required for successful completion of functional activities.  5cycles:  - 1 min walk self selected pace - 1 min break  5 cycles:  - 5 sit <>stand from 18 inch chair with no UE support.  - 1 min break  - seated long arc quad, 10#, 3x10 with 1 min break in between each set. - hooklying bridge, 3x5 with 1 min break between each set.  - seated row with 10# cable, 3x10 with 1 min break between each set - seated bicep curl with 5# DB in each hand, 3x10, with 1 min break between each set.  HOME EXERCISE PROGRAM Access Code: [email or web address] URL: [email or web address] Date: 03/11/2020 Prepared by: [name]  Program Notes   Try to complete daily tasks in 2 minute intervals with 1-2 min seated rest every 2 minutes. This will be annoying and a challenge.   Try to track how your energy feels overall throughout the day if you complete your tasks this way versus just doing each task non-stop until it is complete.   Get a chair to sit on in the shower to decrease energy cost while bathing.   Exercises Walking5 times 1 min walk + 1 min seated rest    PT Education - 03/21/20 1311    Education Details Exercise purpose/form. Self-management techniques    Person(s) Educated Patient    Methods  Explanation;Demonstration;Verbal cues    Comprehension Verbalized understanding;Returned demonstration;Verbal cues required;Need further instruction            PT Short Term Goals - 03/05/20 1838      PT SHORT TERM GOAL #1   Title Be independent with initial home exercise program for self-management of symptoms.    Baseline introduced pacing (03/05/2020);    Time 3    Period Weeks    Status New    Target Date 03/26/20             PT Long Term Goals - 03/05/20 1839      PT LONG TERM GOAL #1   Title Be independent with a long-term home exercise program for self-management of symptoms.    Baseline introduced pacing (03/05/2020);    Time 12    Period Weeks    Status New   TARGET DATE FOR ALL LONG TERM GOALS: 05/28/2020     PT LONG TERM GOAL #2   Title Demonstrate improved FOTO score to 65 by visit #10 demonstrate improvement in overall condition and self-reported functional ability.    Baseline 51 (03/05/2020);    Time 12    Period Weeks    Status New      PT LONG TERM GOAL #3   Title Patient will improve 6 minute walk test to equal or greater than 1400 feet with no assistive device to demonstrate improved community ambulation for improved abliity to return to work (03/05/2020);    Baseline 800 feet and unable to walk longer than 5 minutes (03/05/2020);    Time 12    Period Weeks    Status New      PT LONG TERM GOAL #4   Title Pateint will complete 5 Time Sit To Stand Test in equal or less than 12 seconds from 18.5 inch surface with no UE support to demonstrate improved LE muscle performance to improve ability to perform household chores and work activities    Baseline 17 seconds from 18.5 inch plinth, no UE support (03/05/2020);    Time 12    Period Weeks    Status New    Target Date 05/28/20      PT LONG TERM GOAL #5   Title Complete community, work and/or recreational activities without limitation due to current condition    Baseline difficulty with all aspects of her  life. Unable to work, difficulty with ADLs, dressing, showering, washing the clothes, caring for her children, household and community ambulation, IADLs, difficulty with decision making and executive function tasks (03/05/2020);    Time 12    Period Weeks    Status New  Plan - 03/21/20 1123    Clinical Impression Statement Patient tolerated treatment well overall and was able to increase to one additional set of bicep curls. Continues to be very tired the day after session and feel significant fatigue during session. Continued to use pacing technique during session and encourage it's use at home. Patient would benefit from continued management of limiting condition by skilled physical therapist to address remaining impairments and functional limitations to work towards stated goals and return to PLOF or maximal functional independence.    Personal Factors and Comorbidities Age;Comorbidity 3+;Time since onset of injury/illness/exacerbation;Past/Current Experience;Fitness    Comorbidities Relevant past medical history and comorbidities include hypertension, graves disease, anemia, asthma, leg pain (no dx, takes lyrica), hand pain (went to see if she had RA, but said she did not).    Examination-Activity Limitations Bathing;Hygiene/Grooming;Bed Mobility;Lift;Stairs;Locomotion Level;Stand;Caring for Others;Carry;Dressing    Examination-Participation Restrictions Laundry;Cleaning;Shop;Community Activity;Meal Prep;Driving;Occupation;Yard Work;Interpersonal Relationship   difficulty with all aspects of her life. Unable to work, difficulty with ADLs, dressing, showering, washing the clothes, caring for her children, household and community ambulation, IADLs, difficulty with decision making and executive function tasks.   Stability/Clinical Decision Making Evolving/Moderate complexity    Rehab Potential Good    PT Frequency 2x / week    PT Duration 12 weeks    PT Treatment/Interventions  ADLs/Self Care Home Management;Aquatic Therapy;Moist Heat;Cryotherapy;Therapeutic activities;Therapeutic exercise;Neuromuscular re-education;Cognitive remediation;Patient/family education;Manual techniques;Dry needling;Passive range of motion;Energy conservation;Spinal Manipulations;Joint Manipulations    PT Next Visit Plan pacing, low rep, high resistance strength training as tolerated, low duration endurance intervals    PT Home Exercise Plan Medbridge Access Code: [email or web address]    Consulted and Agree with Plan of Care Patient           Patient will benefit from skilled therapeutic intervention in order to improve the following deficits and impairments:  Decreased cognition, Impaired sensation, Pain, Cardiopulmonary status limiting activity, Decreased mobility, Impaired perceived functional ability, Decreased strength, Decreased range of motion, Decreased endurance, Decreased activity tolerance, Difficulty walking, Obesity  Visit Diagnosis: Muscle weakness (generalized)  Other symptoms and signs involving the musculoskeletal system  Difficulty in walking, not elsewhere classified  Cervicalgia     Problem List Patient Active Problem List   Diagnosis Date Noted  . History of COVID-19 02/28/2020  . 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 Memory loss 02/28/2020  . Physical deconditioning 02/28/2020  . Dyspnea on exertion 02/28/2020  . Neck pain, bilateral 02/28/2020  . Muscle spasms of neck 02/28/2020  . Gallstones 01/30/2020  . COVID-19 01/23/2020  . Arthralgia of hand 01/24/2019  . CRP elevated 01/24/2019  . ESR raised 01/24/2019  . Class 3 severe obesity due to excess calories without serious comorbidity in adult ([place]) 01/24/2019  . Iron deficiency anemia   . Palpitations 09/16/2018  . Coronary artery calcification 09/16/2018  . 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 Bilateral leg edema 04/20/2017  . Incidental lung nodule, > 86m and < 824m11/28/2018  . Hives 09/02/2015  .  Morbid obesity ([place]/13/2017  . Anxiety 07/09/2015  . 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 Thyroid nodule 02/01/2015  . Vitamin D deficiency disease   . Vitamin B12 deficiency   . History of abnormal cervical Pap smear   . Allergy-induced asthma, mild intermittent, uncomplicated [id number]/[id number]  SaEverlean AlstromSnGraylon GoodPT, DPT 03/21/20, 1:11 PM  [facility] [id number]: [phone] Fax:  [phone]: Catherine  AMIJAH Berg MRN: [id number] Date of Birth: [birth year]

## 2020-03-25 ENCOUNTER — Other Ambulatory Visit: Payer: Self-pay

## 2020-03-25 ENCOUNTER — Ambulatory Visit: Payer: Managed Care, Other (non HMO) | Admitting: Physical Therapy

## 2020-03-25 DIAGNOSIS — R262 Difficulty in walking, not elsewhere classified: Secondary | ICD-10-CM

## 2020-03-25 DIAGNOSIS — M542 Cervicalgia: Secondary | ICD-10-CM

## 2020-03-25 DIAGNOSIS — M6281 Muscle weakness (generalized): Secondary | ICD-10-CM

## 2020-03-25 DIAGNOSIS — R29898 Other symptoms and signs involving the musculoskeletal system: Secondary | ICD-10-CM

## 2020-03-25 NOTE — Therapy (Signed)
[facility] [address] [place], [address] Phone: [phone]   Fax:  [phone]  Physical Therapy Treatment  Patient Details  Name: Catherine Berg MRN: [id number] Date of Birth: Jun 24, 1980 Referring Provider (PT): Kriste Basque. Nils Pyle, NP   Encounter Date: 03/25/2020   PT End of Session - 03/25/20 1125    Visit Number 6    Number of Visits 24    Date for PT Re-Evaluation 05/28/20    Authorization Type [employer] MANAGED reporting period from 03/05/2020    PT Start Time 1120    PT Stop Time 1200    PT Time Calculation (min) 40 min    Activity Tolerance Patient limited by fatigue    Behavior During Therapy [facility] for tasks assessed/performed           Past Medical History:  Diagnosis Date  . Abnormal thyroid blood test   . Anemia   . Anxiety   . Asthma   . Complication of anesthesia    itching after c sections  . 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 Graves disease   . History of abnormal cervical Pap smear   . History of cervical polypectomy   . Hives 09/02/2015  . Hypertension   . IFG (impaired fasting glucose)   . Incidental lung nodule, > 45m and < 837m11/28/2018   4 mm RLL lung nodule on chest CT Mar 31, 2017  . Low serum vitamin D   . Obesity   . Pregnancy induced hypertension    with 1st pregnancy, normal pressure with 2nd  . Reflux   . Thyroid disease   . Vitamin B12 deficiency   . Vitamin D deficiency disease   . Wears dentures    full upper    Past Surgical History:  Procedure Laterality Date  . CERVICAL POLYPECTOMY    . CESAREAN SECTION     x 2  . COLONOSCOPY WITH PROPOFOL N/A 11/14/2018   Procedure: COLONOSCOPY WITH PROPOFOL;  Surgeon: TaVirgel ManifoldMD;  [place];  Laterality: N/A;  . ESOPHAGOGASTRODUODENOSCOPY (EGD) WITH PROPOFOL N/A 11/14/2018   Procedure: ESOPHAGOGASTRODUODENOSCOPY (EGD) WITH PROPOFOL;   Surgeon: TaVirgel ManifoldMD;  [place];  Laterality: N/A;  Latex allergy  . GIVENS CAPSULE STUDY N/A 12/27/2018   Procedure: GIVENS CAPSULE STUDY;  Surgeon: TaVirgel ManifoldMD;  Location: [facility] ENDOSCOPY;  Service: Endoscopy;  Laterality: N/A;    There were no vitals filed for this visit.   Subjective Assessment - 03/25/20 1120    Subjective Patient reports she is feeling really fatigued today and rates her fatibue at 8/10 where 10 is the worst. Saturday was really busy and she went to her daughter's parade for a while. this was the first year she was unable to walk in the parade. She rested a lot yesterday and was really tired. She really tired when she left PT last session and she wanted to go the grocery store but could not. She did not do much Friday either because she felt sick like she was going to throw up but she didn't. This is uncommon for her. Did not eat much on Friday.  Neck is still giving her a little trouble with pain. She thinks she turned it really hard last night and has been really sore this morning. Just pain if she moves it up to 7/10.  Pertinent History Patient is a 39 y.o. female who presents to outpatient physical therapy with a referral for medical diagnosis history of COVID-19, physical deconditioning. This patient's chief complaints consist of decreased activity tolerance, fatigue, poor endurance, and difficulty with cognition leading to the following functional deficits: difficulty with all aspects of her life. Unable to work, difficulty with ADLs, dressing, showering, washing the clothes, caring for her children, household and community ambulation, IADLs, difficulty with decision making and tasks requring executive function. Relevant past medical history and comorbidities include hypertension, graves disease, anemia, asthma, leg pain (no dx, takes lyrica), hand pain (went to see if she had RA, but said she did not).  Patient  denies hx of cancer, stroke, seizures, major cardiac events, diabetes, unexplained weight loss, changes in bowel or bladder problems, new onset stumbling or dropping things.    How long can you walk comfortably? 15 minutes    Patient Stated Goals "get back to normal"    Currently in Pain? Yes    Pain Score 7     Pain Location Neck            TREATMENT:  Therapeutic exercise:to centralize symptoms and improve ROM, strength, muscular endurance, and activity tolerance required for successful completion of functional activities.  5cycles:  - 1 min walk self selected pace - 1 min break  5 cycles: - 5 sit <>stand from 18 inch chair with no UE support.  - 1 min break  - seated long arc quad,10#,3x10 with 1 min break in between each set. - hooklying bridge, 3x5 with 1 min break between each set.  - seated row with 10# cable, 3x10 with 1 min break between each set - seated bicep curl with 5# DB in each hand, 3x10, with 1 min break between each set.   HOME EXERCISE PROGRAM Access Code: [email or web address] URL: [email or web address] Date: 03/11/2020 Prepared by: [name]  Program Notes   Try to complete daily tasks in 2 minute intervals with 1-2 min seated rest every 2 minutes. This will be annoying and a challenge.   Try to track how your energy feels overall throughout the day if you complete your tasks this way versus just doing each task non-stop until it is complete.   Get a chair to sit on in the shower to decrease energy cost while bathing.   Exercises Walking5 times 1 min walk + 1 min seated rest   PT Education - 03/25/20 1124    Education Details Exercise purpose/form. Self-management techniques    Person(s) Educated Patient    Methods Explanation;Demonstration;Verbal cues    Comprehension Verbalized understanding;Returned demonstration;Verbal cues required;Need further instruction            PT Short Term Goals - 03/05/20 1838      PT  SHORT TERM GOAL #1   Title Be independent with initial home exercise program for self-management of symptoms.    Baseline introduced pacing (03/05/2020);    Time 3    Period Weeks    Status New    Target Date 03/26/20             PT Long Term Goals - 03/05/20 1839      PT LONG TERM GOAL #1   Title Be independent with a long-term home exercise program for self-management of symptoms.    Baseline introduced pacing (03/05/2020);    Time 12    Period Weeks    Status New   TARGET DATE FOR ALL LONG TERM GOALS: 05/28/2020  PT LONG TERM GOAL #2   Title Demonstrate improved FOTO score to 65 by visit #10 demonstrate improvement in overall condition and self-reported functional ability.    Baseline 51 (03/05/2020);    Time 12    Period Weeks    Status New      PT LONG TERM GOAL #3   Title Patient will improve 6 minute walk test to equal or greater than 1400 feet with no assistive device to demonstrate improved community ambulation for improved abliity to return to work (03/05/2020);    Baseline 800 feet and unable to walk longer than 5 minutes (03/05/2020);    Time 12    Period Weeks    Status New      PT LONG TERM GOAL #4   Title Pateint will complete 5 Time Sit To Stand Test in equal or less than 12 seconds from 18.5 inch surface with no UE support to demonstrate improved LE muscle performance to improve ability to perform household chores and work activities    Baseline 17 seconds from 18.5 inch plinth, no UE support (03/05/2020);    Time 12    Period Weeks    Status New    Target Date 05/28/20      PT LONG TERM GOAL #5   Title Complete community, work and/or recreational activities without limitation due to current condition    Baseline difficulty with all aspects of her life. Unable to work, difficulty with ADLs, dressing, showering, washing the clothes, caring for her children, household and community ambulation, IADLs, difficulty with decision making and executive function tasks  (03/05/2020);    Time 12    Period Weeks    Status New                 Plan - 03/25/20 1128    Clinical Impression Statement Patient tolerated session with increcreased fatigue today. Did not advance exercises due to increased fatigue. Continued pacing strategies. Patient would benefit from continued management of limiting condition by skilled physical therapist to address remaining impairments and functional limitations to work towards stated goals and return to PLOF or maximal functional independence.    Personal Factors and Comorbidities Age;Comorbidity 3+;Time since onset of injury/illness/exacerbation;Past/Current Experience;Fitness    Comorbidities Relevant past medical history and comorbidities include hypertension, graves disease, anemia, asthma, leg pain (no dx, takes lyrica), hand pain (went to see if she had RA, but said she did not).    Examination-Activity Limitations Bathing;Hygiene/Grooming;Bed Mobility;Lift;Stairs;Locomotion Level;Stand;Caring for Others;Carry;Dressing    Examination-Participation Restrictions Laundry;Cleaning;Shop;Community Activity;Meal Prep;Driving;Occupation;Yard Work;Interpersonal Relationship   difficulty with all aspects of her life. Unable to work, difficulty with ADLs, dressing, showering, washing the clothes, caring for her children, household and community ambulation, IADLs, difficulty with decision making and executive function tasks.   Stability/Clinical Decision Making Evolving/Moderate complexity    Rehab Potential Good    PT Frequency 2x / week    PT Duration 12 weeks    PT Treatment/Interventions ADLs/Self Care Home Management;Aquatic Therapy;Moist Heat;Cryotherapy;Therapeutic activities;Therapeutic exercise;Neuromuscular re-education;Cognitive remediation;Patient/family education;Manual techniques;Dry needling;Passive range of motion;Energy conservation;Spinal Manipulations;Joint Manipulations    PT Next Visit Plan pacing, low rep, high  resistance strength training as tolerated, low duration endurance intervals    PT Home Exercise Plan Medbridge Access Code: [email or web address]    Consulted and Agree with Plan of Care Patient           Patient will benefit from skilled therapeutic intervention in order to improve the following deficits and impairments:  Decreased cognition, Impaired sensation,  Pain, Cardiopulmonary status limiting activity, Decreased mobility, Impaired perceived functional ability, Decreased strength, Decreased range of motion, Decreased endurance, Decreased activity tolerance, Difficulty walking, Obesity  Visit Diagnosis: Muscle weakness (generalized)  Other symptoms and signs involving the musculoskeletal system  Difficulty in walking, not elsewhere classified  Cervicalgia     Problem List Patient Active Problem List   Diagnosis Date Noted  . History of COVID-19 02/28/2020  . 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 Memory loss 02/28/2020  . Physical deconditioning 02/28/2020  . Dyspnea on exertion 02/28/2020  . Neck pain, bilateral 02/28/2020  . Muscle spasms of neck 02/28/2020  . Gallstones 01/30/2020  . COVID-19 01/23/2020  . Arthralgia of hand 01/24/2019  . CRP elevated 01/24/2019  . ESR raised 01/24/2019  . Class 3 severe obesity due to excess calories without serious comorbidity in adult ([place]) 01/24/2019  . Iron deficiency anemia   . Palpitations 09/16/2018  . Coronary artery calcification 09/16/2018  . 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 Bilateral leg edema 04/20/2017  . Incidental lung nodule, > 51m and < 896m11/28/2018  . Hives 09/02/2015  . Morbid obesity ([place]/13/2017  . Anxiety 07/09/2015  . 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 Thyroid nodule 02/01/2015  . Vitamin D deficiency disease   . Vitamin B12 deficiency   . History of abnormal cervical Pap smear   . Allergy-induced asthma, mild intermittent, uncomplicated 0627/07/5007  SaEverlean AlstromSnGraylon GoodPT, DPT 03/25/20,  12:08 PM  [facility] [id number]: [phone] Fax:  [phone]: TaAYELEN SCIORTINORN: [id number] of Birth: 9/Sep 28, 1980

## 2020-04-03 ENCOUNTER — Encounter: Payer: Self-pay | Admitting: Physical Therapy

## 2020-04-03 ENCOUNTER — Ambulatory Visit: Payer: Managed Care, Other (non HMO) | Attending: Nurse Practitioner | Admitting: Physical Therapy

## 2020-04-03 ENCOUNTER — Other Ambulatory Visit: Payer: Self-pay

## 2020-04-03 DIAGNOSIS — M6281 Muscle weakness (generalized): Secondary | ICD-10-CM | POA: Diagnosis not present

## 2020-04-03 DIAGNOSIS — R29898 Other symptoms and signs involving the musculoskeletal system: Secondary | ICD-10-CM | POA: Diagnosis present

## 2020-04-03 DIAGNOSIS — M542 Cervicalgia: Secondary | ICD-10-CM | POA: Diagnosis present

## 2020-04-03 DIAGNOSIS — R262 Difficulty in walking, not elsewhere classified: Secondary | ICD-10-CM | POA: Diagnosis present

## 2020-04-03 NOTE — Therapy (Signed)
[place] [facility] [address], [place], [address] Phone: [phone]   Fax:  [phone]  Physical Therapy Treatment  Patient Details  Name: Catherine Berg MRN: [id number] Date of Birth: [birth year] Referring Provider (PT): Kriste Basque. Nils Pyle, NP   Encounter Date: 04/03/2020   PT End of Session - 04/03/20 1048    Visit Number 7    Number of Visits 24    Date for PT Re-Evaluation 05/28/20    Authorization Type [employer] MANAGED reporting period from 03/05/2020    PT Start Time 1037    PT Stop Time 1115    PT Time Calculation (min) 38 min    Activity Tolerance Patient limited by fatigue    Behavior During Therapy [facility] for tasks assessed/performed           Past Medical History:  Diagnosis Date  . Abnormal thyroid blood test   . Anemia   . Anxiety   . Asthma   . Complication of anesthesia    itching after c sections  . 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 Graves disease   . History of abnormal cervical Pap smear   . History of cervical polypectomy   . Hives 09/02/2015  . Hypertension   . IFG (impaired fasting glucose)   . Incidental lung nodule, > 38m and < 866m11/28/2018   4 mm RLL lung nodule on chest CT Mar 31, 2017  . Low serum vitamin D   . Obesity   . Pregnancy induced hypertension    with 1st pregnancy, normal pressure with 2nd  . Reflux   . Thyroid disease   . Vitamin B12 deficiency   . Vitamin D deficiency disease   . Wears dentures    full upper    Past Surgical History:  Procedure Laterality Date  . CERVICAL POLYPECTOMY    . CESAREAN SECTION     x 2  . COLONOSCOPY WITH PROPOFOL N/A 11/14/2018   Procedure: COLONOSCOPY WITH PROPOFOL;  Surgeon: TaVirgel ManifoldMD;  [place] [facility]: [place];  Laterality: N/A;  . ESOPHAGOGASTRODUODENOSCOPY (EGD) WITH PROPOFOL N/A 11/14/2018   Procedure: ESOPHAGOGASTRODUODENOSCOPY (EGD) WITH PROPOFOL;   Surgeon: TaVirgel ManifoldMD;  [facility] Service: End[facility];  Laterality: N/A;  Latex allergy  . GIVENS CAPSULE STUDY N/A 12/27/2018   Procedure: GIVENS CAPSULE STUDY;  Surgeon: TaVirgel ManifoldMD;  Location: [facility] ENDOSCOPY;  Service: Endoscopy;  Laterality: N/A;    There were no vitals filed for this visit.   Subjective Assessment - 04/03/20 1046    Subjective Pateint report her left hip started hurting last night for no apparent reason and is a bit better this morning at 6/10. Also bothered her right wrist yesterday and is wearing a wrist splint. States she is pretty fatigued. Did have some back pain last time. her son partially dislocated his shoulder so she had to to get him medicine yesterday and she went to a basketball game. Her body was very sore last night and everything hurt, which has not been common for her in the whole body.    Pertinent History Patient is a [age].o. female who presents to outpatient physical therapy with a referral for medical diagnosis history of COVID-19, physical deconditioning. This patient's chief complaints consist of decreased activity tolerance, fatigue, poor endurance, and difficulty with cognition leading to the following functional deficits: difficulty with all aspects of her life.  Unable to work, difficulty with ADLs, dressing, showering, washing the clothes, caring for her children, household and community ambulation, IADLs, difficulty with decision making and tasks requring executive function. Relevant past medical history and comorbidities include hypertension, graves disease, anemia, asthma, leg pain (no dx, takes lyrica), hand pain (went to see if she had RA, but said she did not).  Patient denies hx of cancer, stroke, seizures, major cardiac events, diabetes, unexplained weight loss, changes in bowel or bladder problems, new onset stumbling or dropping things.    How long can you walk comfortably? 15 minutes    Patient Stated  Goals "get back to normal"    Currently in Pain? Yes    Pain Score 6     Pain Location Hip    Pain Orientation Left;Lateral           TREATMENT:  Therapeutic exercise:to centralize symptoms and improve ROM, strength, muscular endurance, and activity tolerance required for successful completion of functional activities. - NuStep level 0 using bilateral upper and lower extremities. Seat setting 7, handle setting 7. For improved extremity mobility, muscular endurance, and activity tolerance; and to induce the analgesic effect of aerobic exercise, stimulate improved joint nutrition. X 20 minutes.  - standing lumbar extension 2x10 - prone lumbar extension x 10 - short ambulation after extensions to assess progres x50 feet, x100 feet.  Manual therapy: to reduce pain and tissue tension, improve range of motion, neuromodulation, in order to promote improved ability to complete functional activities. - prone CPA and L UPA at lumbar spine focusing on lowest segments and over sacrum, grade III.  - prone STM to left glute soft tissue (very tender and tight) - educated patient how to use LAX ball for self STM on the wall  HOME EXERCISE PROGRAM Access Code: [email or web address] URL: [email or web address] Date: 03/11/2020 Prepared by: [name]  Program Notes   Try to complete daily tasks in 2 minute intervals with 1-2 min seated rest every 2 minutes. This will be annoying and a challenge.   Try to track how your energy feels overall throughout the day if you complete your tasks this way versus just doing each task non-stop until it is complete.   Get a chair to sit on in the shower to decrease energy cost while bathing.   Exercises Walking5 times 1 min walk + 1 min seated rest     PT Education - 04/03/20 1048    Education Details Exercise purpose/form. Self-management techniques    Person(s) Educated Patient    Methods Explanation;Demonstration;Tactile cues;Verbal cues     Comprehension Verbalized understanding;Returned demonstration;Verbal cues required;Tactile cues required;Need further instruction            PT Short Term Goals - 03/05/20 1838      PT SHORT TERM GOAL #1   Title Be independent with initial home exercise program for self-management of symptoms.    Baseline introduced pacing (03/05/2020);    Time 3    Period Weeks    Status New    Target Date 03/26/20             PT Long Term Goals - 03/05/20 1839      PT LONG TERM GOAL #1   Title Be independent with a long-term home exercise program for self-management of symptoms.    Baseline introduced pacing (03/05/2020);    Time 12    Period Weeks    Status New   TARGET DATE FOR ALL LONG TERM GOALS: 05/28/2020  PT LONG TERM GOAL #2   Title Demonstrate improved FOTO score to 65 by visit #10 demonstrate improvement in overall condition and self-reported functional ability.    Baseline 51 (03/05/2020);    Time 12    Period Weeks    Status New      PT LONG TERM GOAL #3   Title Patient will improve 6 minute walk test to equal or greater than 1400 feet with no assistive device to demonstrate improved community ambulation for improved abliity to return to work (03/05/2020);    Baseline 800 feet and unable to walk longer than 5 minutes (03/05/2020);    Time 12    Period Weeks    Status New      PT LONG TERM GOAL #4   Title Pateint will complete 5 Time Sit To Stand Test in equal or less than 12 seconds from 18.5 inch surface with no UE support to demonstrate improved LE muscle performance to improve ability to perform household chores and work activities    Baseline 17 seconds from 18.5 inch plinth, no UE support (03/05/2020);    Time 12    Period Weeks    Status New    Target Date 05/28/20      PT LONG TERM GOAL #5   Title Complete community, work and/or recreational activities without limitation due to current condition    Baseline difficulty with all aspects of her life. Unable to work,  difficulty with ADLs, dressing, showering, washing the clothes, caring for her children, household and community ambulation, IADLs, difficulty with decision making and executive function tasks (03/05/2020);    Time 12    Period Weeks    Status New                 Plan - 04/03/20 1512    Clinical Impression Statement Patient presented with left hip pain today that appeared to be related to lowest segments of L lumbar spine and left gluteal region. Improved with STM but no significant change with extension exercise. Did appear to tolerate low level steady state exercise for 20 min on the nustep. Exploring more intervention strategies due to minimal progress at this point with previous interventions. Patient would benefit from continued management of limiting condition by skilled physical therapist to address remaining impairments and functional limitations to work towards stated goals and return to PLOF or maximal functional independence.    Personal Factors and Comorbidities Age;Comorbidity 3+;Time since onset of injury/illness/exacerbation;Past/Current Experience;Fitness    Comorbidities Relevant past medical history and comorbidities include hypertension, graves disease, anemia, asthma, leg pain (no dx, takes lyrica), hand pain (went to see if she had RA, but said she did not).    Examination-Activity Limitations Bathing;Hygiene/Grooming;Bed Mobility;Lift;Stairs;Locomotion Level;Stand;Caring for Others;Carry;Dressing    Examination-Participation Restrictions Laundry;Cleaning;Shop;Community Activity;Meal Prep;Driving;Occupation;Yard Work;Interpersonal Relationship   difficulty with all aspects of her life. Unable to work, difficulty with ADLs, dressing, showering, washing the clothes, caring for her children, household and community ambulation, IADLs, difficulty with decision making and executive function tasks.   Stability/Clinical Decision Making Evolving/Moderate complexity    Rehab Potential  Good    PT Frequency 2x / week    PT Duration 12 weeks    PT Treatment/Interventions ADLs/Self Care Home Management;Aquatic Therapy;Moist Heat;Cryotherapy;Therapeutic activities;Therapeutic exercise;Neuromuscular re-education;Cognitive remediation;Patient/family education;Manual techniques;Dry needling;Passive range of motion;Energy conservation;Spinal Manipulations;Joint Manipulations    PT Next Visit Plan pacing, low rep, high resistance strength training as tolerated, low duration endurance intervals    PT Home Exercise Plan Medbridge Access  Code: [email or web address]    Consulted and Agree with Plan of Care Patient           Patient will benefit from skilled therapeutic intervention in order to improve the following deficits and impairments:  Decreased cognition, Impaired sensation, Pain, Cardiopulmonary status limiting activity, Decreased mobility, Impaired perceived functional ability, Decreased strength, Decreased range of motion, Decreased endurance, Decreased activity tolerance, Difficulty walking, Obesity  Visit Diagnosis: Muscle weakness (generalized)  Other symptoms and signs involving the musculoskeletal system  Difficulty in walking, not elsewhere classified  Cervicalgia     Problem List Patient Active Problem List   Diagnosis Date Noted  . History of COVID-19 02/28/2020  . 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 Memory loss 02/28/2020  . Physical deconditioning 02/28/2020  . Dyspnea on exertion 02/28/2020  . Neck pain, bilateral 02/28/2020  . Muscle spasms of neck 02/28/2020  . Gallstones 01/30/2020  . COVID-19 01/23/2020  . Arthralgia of hand 01/24/2019  . CRP elevated 01/24/2019  . ESR raised 01/24/2019  . Class 3 severe obesity due to excess calories without serious comorbidity in adult ([place]) 01/24/2019  . Iron deficiency anemia   . Palpitations 09/16/2018  . Coronary artery calcification 09/16/2018  . 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 Bilateral leg edema  04/20/2017  . Incidental lung nodule, > 61m and < 856m11/28/2018  . Hives 09/02/2015  . Morbid [place] ([place]/13/2017  . Anxiety 07/09/2015  . 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 Thyroid nodule 02/01/2015  . Vitamin D deficiency disease   . Vitamin B12 deficiency   . History of abnormal cervical Pap smear   . Allergy-induced asthma, mild intermittent, uncomplicated [id number]  SaEverlean AlstromSnGraylon GoodPT, DPT 04/03/20, 3:12 PM  [facility] [address]hone: [phone] Fax:  [phone]: Catherine YANESRN: [id number] of Birth: 9/Sep 10, 1980

## 2020-04-09 ENCOUNTER — Other Ambulatory Visit: Payer: Self-pay

## 2020-04-09 ENCOUNTER — Ambulatory Visit: Payer: Managed Care, Other (non HMO) | Admitting: Physical Therapy

## 2020-04-09 ENCOUNTER — Encounter: Payer: Self-pay | Admitting: Physical Therapy

## 2020-04-09 DIAGNOSIS — R262 Difficulty in walking, not elsewhere classified: Secondary | ICD-10-CM

## 2020-04-09 DIAGNOSIS — M6281 Muscle weakness (generalized): Secondary | ICD-10-CM | POA: Diagnosis not present

## 2020-04-09 DIAGNOSIS — M542 Cervicalgia: Secondary | ICD-10-CM

## 2020-04-09 DIAGNOSIS — R29898 Other symptoms and signs involving the musculoskeletal system: Secondary | ICD-10-CM

## 2020-04-09 NOTE — Therapy (Signed)
[facility] [address], [place], [address] Phone: [phone]   Fax:  [phone]  Physical Therapy Treatment  Patient Details  Name: Catherine Berg MRN: [id number] Date of Birth: June 04, 1980 Referring Provider (PT): Kriste Basque. Nils Pyle, NP   Encounter Date: 04/09/2020   PT End of Session - 04/09/20 1045    Visit Number 8    Number of Visits 24    Date for PT Re-Evaluation 05/28/20    Authorization Type [employer] MANAGED reporting period from 03/05/2020    PT Start Time 1035    PT Stop Time 1115    PT Time Calculation (min) 40 min    Activity Tolerance Patient limited by fatigue    Behavior During Therapy [facility] for tasks assessed/performed           Past Medical History:  Diagnosis Date  . Abnormal thyroid blood test   . Anemia   . Anxiety   . Asthma   . Complication of anesthesia    itching after c sections  . 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 Graves disease   . History of abnormal cervical Pap smear   . History of cervical polypectomy   . Hives 09/02/2015  . Hypertension   . IFG (impaired fasting glucose)   . Incidental lung nodule, > 22m and < 853m11/28/2018   4 mm RLL lung nodule on chest CT Mar 31, 2017  . Low serum vitamin D   . Obesity   . Pregnancy induced hypertension    with 1st pregnancy, normal pressure with 2nd  . Reflux   . Thyroid disease   . Vitamin B12 deficiency   . Vitamin D deficiency disease   . Wears dentures    full upper    Past Surgical History:  Procedure Laterality Date  . CERVICAL POLYPECTOMY    . CESAREAN SECTION     x 2  . COLONOSCOPY WITH PROPOFOL N/A 11/14/2018   Procedure: COLONOSCOPY WITH PROPOFOL;  Surgeon: TaVirgel ManifoldMD;  [facility];  Laterality: N/A;  . ESOPHAGOGASTRODUODENOSCOPY (EGD) WITH PROPOFOL N/A 11/14/2018   Procedure: ESOPHAGOGASTRODUODENOSCOPY (EGD) WITH PROPOFOL;   Surgeon: TaVirgel ManifoldMD;  [facility];  Laterality: N/A;  Latex allergy  . GIVENS CAPSULE STUDY N/A 12/27/2018   Procedure: GIVENS CAPSULE STUDY;  Surgeon: TaVirgel ManifoldMD;  Location: [facility] ENDOSCOPY;  Service: Endoscopy;  Laterality: N/A;    There were no vitals filed for this visit.   Subjective Assessment - 04/09/20 1037    Subjective Patient reports her pain in her left hip is better and is now closer to her back rated 3/10. Fatigue level is 4/10. Yesterday she was really tired and she did sleep a bit during the day even though she tries not to do this. Did continue to have pain following last treatment session but she could not tell that she was any more fatigued than she usually is after PT. States she likes the nustep. Both legs were hurting this morning and she has medication for it. Used to hurt like this [place] it had mostly stopped over the last few months. It does seem to be coming back. They just hurt and ache. Didn't do much yesterday.    Pertinent History Patient is a [age].o. female who presents to outpatient physical therapy with a referral for medical diagnosis history  of COVID-19, physical deconditioning. This patient's chief complaints consist of decreased activity tolerance, fatigue, poor endurance, and difficulty with cognition leading to the following functional deficits: difficulty with all aspects of her life. Unable to work, difficulty with ADLs, dressing, showering, washing the clothes, caring for her children, household and community ambulation, IADLs, difficulty with decision making and tasks requring executive function. Relevant past medical history and comorbidities include hypertension, graves disease, anemia, asthma, leg pain (no dx, takes lyrica), hand pain (went to see if she had RA, but said she did not).  Patient denies hx of cancer, stroke, seizures, major cardiac events, diabetes, unexplained weight  loss, changes in bowel or bladder problems, new onset stumbling or dropping things.    How long can you walk comfortably? 15 minutes    Patient Stated Goals "get back to normal"    Currently in Pain? Yes    Pain Score 3     Pain Location Back   hip/glute   Pain Orientation Left           TREATMENT:  Therapeutic exercise:to centralize symptoms and improve ROM, strength, muscular endurance, and activity tolerance required for successful completion of functional activities. - NuStep level 0 using bilateral upper and lower extremities. Seat setting 7, handle setting 7. For improved extremity mobility, muscular endurance, and activity tolerance; and to induce the analgesic effect of aerobic exercise, stimulate improved joint nutrition. X 15 minutes. Average SPM = 79 - Total gym squats as deep as possible at level 25, 3x10 - modified push up on treadmill bar, 3x10 - standing row with 15# cable, 3x10 - seated hamstring curl on omega machine, 25#, 3x10  Pt required multimodal cuing for proper technique and to facilitate improved neuromuscular control, strength, range of motion, and functional ability resulting in improved performance and form.   HOME EXERCISE PROGRAM Access Code: [email or web address] URL: [email or web address] Date: 03/11/2020 Prepared by: Catherine Berg  Program Notes   Try to complete daily tasks in 2 minute intervals with 1-2 min seated rest every 2 minutes. This will be annoying and a challenge.   Try to track how your energy feels overall throughout the day if you complete your tasks this way versus just doing each task non-stop until it is complete.   Get a chair to sit on in the shower to decrease energy cost while bathing.   Exercises Walking5 times 1 min walk + 1 min seated rest    PT Education - 04/09/20 1045    Education Details Exercise purpose/form. Self-management techniques    Person(s) Educated Patient    Methods  Explanation;Demonstration;Verbal cues    Comprehension Verbalized understanding;Returned demonstration;Verbal cues required;Need further instruction            PT Short Term Goals - 03/05/20 1838      PT SHORT TERM GOAL #1   Title Be independent with initial home exercise program for self-management of symptoms.    Baseline introduced pacing (03/05/2020);    Time 3    Period Weeks    Status New    Target Date 03/26/20             PT Long Term Goals - 03/05/20 1839      PT LONG TERM GOAL #1   Title Be independent with a long-term home exercise program for self-management of symptoms.    Baseline introduced pacing (03/05/2020);    Time 12    Period Weeks    Status New   TARGET DATE FOR ALL LONG  TERM GOALS: 05/28/2020     PT LONG TERM GOAL #2   Title Demonstrate improved FOTO score to 65 by visit #10 demonstrate improvement in overall condition and self-reported functional ability.    Baseline 51 (03/05/2020);    Time 12    Period Weeks    Status New      PT LONG TERM GOAL #3   Title Patient will improve 6 minute walk test to equal or greater than 1400 feet with no assistive device to demonstrate improved community ambulation for improved abliity to return to work (03/05/2020);    Baseline 800 feet and unable to walk longer than 5 minutes (03/05/2020);    Time 12    Period Weeks    Status New      PT LONG TERM GOAL #4   Title Pateint will complete 5 Time Sit To Stand Test in equal or less than 12 seconds from 18.5 inch surface with no UE support to demonstrate improved LE muscle performance to improve ability to perform household chores and work activities    Baseline 17 seconds from 18.5 inch plinth, no UE support (03/05/2020);    Time 12    Period Weeks    Status New    Target Date 05/28/20      PT LONG TERM GOAL #5   Title Complete community, work and/or recreational activities without limitation due to current condition    Baseline difficulty with all aspects of her  life. Unable to work, difficulty with ADLs, dressing, showering, washing the clothes, caring for her children, household and community ambulation, IADLs, difficulty with decision making and executive function tasks (03/05/2020);    Time 12    Period Weeks    Status New                 Plan - 04/09/20 1127    Clinical Impression Statement Patient tolerated treatment well overall but required therapeutic rest breaks between each set. Felt some cramping in her legs L > R following hamstring curls. Continued to follow more traditional strengthening and endurance training model and will monitor recovery at next session. Patient would benefit from continued management of limiting condition by skilled physical therapist to address remaining impairments and functional limitations to work towards stated goals and return to PLOF or maximal functional independence.    Personal Factors and Comorbidities Age;Comorbidity 3+;Time since onset of injury/illness/exacerbation;Past/Current Experience;Fitness    Comorbidities Relevant past medical history and comorbidities include hypertension, graves disease, anemia, asthma, leg pain (no dx, takes lyrica), hand pain (went to see if she had RA, but said she did not).    Examination-Activity Limitations Bathing;Hygiene/Grooming;Bed Mobility;Lift;Stairs;Locomotion Level;Stand;Caring for Others;Carry;Dressing    Examination-Participation Restrictions Laundry;Cleaning;Shop;Community Activity;Meal Prep;Driving;Occupation;Yard Work;Interpersonal Relationship   difficulty with all aspects of her life. Unable to work, difficulty with ADLs, dressing, showering, washing the clothes, caring for her children, household and community ambulation, IADLs, difficulty with decision making and executive function tasks.   Stability/Clinical Decision Making Evolving/Moderate complexity    Rehab Potential Good    PT Frequency 2x / week    PT Duration 12 weeks    PT  Treatment/Interventions ADLs/Self Care Home Management;Aquatic Therapy;Moist Heat;Cryotherapy;Therapeutic activities;Therapeutic exercise;Neuromuscular re-education;Cognitive remediation;Patient/family education;Manual techniques;Dry needling;Passive range of motion;Energy conservation;Spinal Manipulations;Joint Manipulations    PT Next Visit Plan pacing, low rep, high resistance strength training as tolerated, low duration endurance intervals    PT Home Exercise Plan Medbridge Access Code: [email or web address]    Consulted and Agree with Plan of Care Patient  Patient will benefit from skilled therapeutic intervention in order to improve the following deficits and impairments:  Decreased cognition, Impaired sensation, Pain, Cardiopulmonary status limiting activity, Decreased mobility, Impaired perceived functional ability, Decreased strength, Decreased range of motion, Decreased endurance, Decreased activity tolerance, Difficulty walking, Obesity  Visit Diagnosis: Muscle weakness (generalized)  Other symptoms and signs involving the musculoskeletal system  Difficulty in walking, not elsewhere classified  Cervicalgia     Problem List Patient Active Problem List   Diagnosis Date Noted  . History of COVID-19 02/28/2020  . 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 Memory loss 02/28/2020  . Physical deconditioning 02/28/2020  . Dyspnea on exertion 02/28/2020  . Neck pain, bilateral 02/28/2020  . Muscle spasms of neck 02/28/2020  . Gallstones 01/30/2020  . COVID-19 01/23/2020  . Arthralgia of hand 01/24/2019  . CRP elevated 01/24/2019  . ESR raised 01/24/2019  . Class 3 severe obesity due to excess calories without serious comorbidity in adult ([place]) 01/24/2019  . Iron deficiency anemia   . Palpitations 09/16/2018  . Coronary artery calcification 09/16/2018  . 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 Bilateral leg edema 04/20/2017  . Incidental lung nodule, > 24m and < 817m11/28/2018  .  Hives 09/02/2015  . Morbid obesity ([place]04/13/2017  . Anxiety 07/09/2015  . 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 Thyroid nodule 02/01/2015  . Vitamin D deficiency disease   . Vitamin B12 deficiency   . History of abnormal cervical Pap smear   . Allergy-induced asthma, mild intermittent, uncomplicated 0697/[id number]  SaEverlean AlstromSnGraylon GoodPT, DPT 04/09/20, 11:28 AM  [facility] [id number]: [phone] Fax:  [phone]: TaKATIEANN HUNGATERN: [id number] of Birth: 01/1981/04/28

## 2020-04-11 ENCOUNTER — Other Ambulatory Visit: Payer: Self-pay

## 2020-04-11 ENCOUNTER — Ambulatory Visit: Payer: Managed Care, Other (non HMO) | Admitting: Physical Therapy

## 2020-04-11 DIAGNOSIS — R29898 Other symptoms and signs involving the musculoskeletal system: Secondary | ICD-10-CM

## 2020-04-11 DIAGNOSIS — M542 Cervicalgia: Secondary | ICD-10-CM

## 2020-04-11 DIAGNOSIS — M6281 Muscle weakness (generalized): Secondary | ICD-10-CM

## 2020-04-11 DIAGNOSIS — R262 Difficulty in walking, not elsewhere classified: Secondary | ICD-10-CM

## 2020-04-11 NOTE — Therapy (Signed)
[facility] [address] [place], [address] Phone: [phone]   Fax:  [phone]  Physical Therapy Treatment  Patient Details  Name: Catherine Berg MRN: [id number] Date of Birth: [birth year] Referring Provider (PT): Kriste Basque. Nils Pyle, NP   Encounter Date: 04/11/2020   PT End of Session - 04/11/20 0910    Visit Number 9    Number of Visits 24    Date for PT Re-Evaluation 05/28/20    Authorization Type [employer] MANAGED reporting period from 03/05/2020    PT Start Time 0901    PT Stop Time 0940    PT Time Calculation (min) 39 min    Activity Tolerance Patient limited by fatigue    Behavior During Therapy [facility] for tasks assessed/performed           Past Medical History:  Diagnosis Date  . Abnormal thyroid blood test   . Anemia   . Anxiety   . Asthma   . Complication of anesthesia    itching after c sections  . 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 Graves disease   . History of abnormal cervical Pap smear   . History of cervical polypectomy   . Hives 09/02/2015  . Hypertension   . IFG (impaired fasting glucose)   . Incidental lung nodule, > 84m and < 827m11/28/2018   4 mm RLL lung nodule on chest CT Mar 31, 2017  . Low serum vitamin D   . Obesity   . Pregnancy induced hypertension    with 1st pregnancy, normal pressure with 2nd  . Reflux   . Thyroid disease   . Vitamin B12 deficiency   . Vitamin D deficiency disease   . Wears dentures    full upper    Past Surgical History:  Procedure Laterality Date  . CERVICAL POLYPECTOMY    . CESAREAN SECTION     x 2  . COLONOSCOPY WITH PROPOFOL N/A 11/14/2018   Procedure: COLONOSCOPY WITH PROPOFOL;  Surgeon: TaVirgel ManifoldMD;  [facility] [facility]: [place];  Laterality: N/A;  . ESOPHAGOGASTRODUODENOSCOPY (EGD) WITH PROPOFOL N/A 11/14/2018   Procedure: ESOPHAGOGASTRODUODENOSCOPY (EGD) WITH PROPOFOL;   Surgeon: TaVirgel ManifoldMD;  [place] [facility] [place]: [place];  Laterality: N/A;  Latex allergy  . GIVENS CAPSULE STUDY N/A 12/27/2018   Procedure: GIVENS CAPSULE STUDY;  Surgeon: TaVirgel ManifoldMD;  Location: [facility] ENDOSCOPY;  Service: Endoscopy;  Laterality: N/A;    There were no vitals filed for this visit.   Subjective Assessment - 04/11/20 0907    Subjective Patient reports she had some brain fog and worse fatigue than usual yesterday (the day following her last treatment session). She has identified the bleacher as something that definitely irritates her back and left hip. Her back and hip pain is 5/10 currently. She is feeling more clear today. Fatigue is about 3-4/10.    Pertinent History Patient is a [age].o. female who presents to outpatient physical therapy with a referral for medical diagnosis history of COVID-19, physical deconditioning. This patient's chief complaints consist of decreased activity tolerance, fatigue, poor endurance, and difficulty with cognition leading to the following functional deficits: difficulty with all aspects of her life. Unable to work, difficulty with ADLs, dressing, showering, washing the clothes, caring for her children, household and community ambulation, IADLs, difficulty with decision making and tasks requring executive function. Relevant past medical history and comorbidities include  hypertension, graves disease, anemia, asthma, leg pain (no dx, takes lyrica), hand pain (went to see if she had RA, but said she did not).  Patient denies hx of cancer, stroke, seizures, major cardiac events, diabetes, unexplained weight loss, changes in bowel or bladder problems, new onset stumbling or dropping things.    How long can you walk comfortably? 15 minutes    Patient Stated Goals "get back to normal"    Currently in Pain? Yes    Pain Score 5     Pain Location Back    Pain Orientation Left    Pain Radiating Towards hip            TREATMENT:  Therapeutic exercise:to centralize symptoms and improve ROM, strength, muscular endurance, and activity tolerance required for successful completion of functional activities. -NuStep level1using bilateral upper and lower extremities. Seat setting 7, handle setting 6. For improved extremity mobility, muscular endurance, and activity tolerance; and to induce the analgesic effect of aerobic exercise, stimulate improved joint nutrition. X26mnutes. Average SPM = 86 - MDT lumbar extension in prone, 3x10 - Total gym squats as deep as possible at level 25, 3x10 - seated hamstring curl on omega machine, 35#, 3x10 - standing lumbar extension 3x10 (after seated exercises) - Education on HEP including handout   Manual therapy: to reduce pain and tissue tension, improve range of motion, neuromodulation, in order to promote improved ability to complete functional activities. Prone position:  - STM to lumbar paraspinals and glutes - CPA grade III-IV at lowest level of lumbar spine with KE, 3x45 at most tender level and 1x45 level above and below.   Pt required multimodal cuing for proper technique and to facilitate improved neuromuscular control, strength, range of motion, and functional ability resulting in improved performance and form.   HOME EXERCISE PROGRAM Access Code: [email or web address] URL: [email or web address] Date: 04/11/2020 Prepared by: SRosita Kea Program Notes Try to complete daily tasks in 2 minute intervals with 1-2 min seated rest every 2 minutes. This will be annoying and a challenge.  Try to track how your energy feels overall throughout the day if you complete your tasks this way versus just doing each task non-stop until it is complete.  Get a chair to sit on in the shower to decrease energy cost while bathing.   Exercises Walking Seated Correct Posture Prone Press Up - 4 x daily - 10-15 reps - 1 second hold Standing Lumbar Extension - 4 x  daily - 10-15 reps - 1 sets - 1 second hold    PT Education - 04/11/20 0910    Education Details Exercise purpose/form. Self-management techniques    Person(s) Educated Patient    Methods Explanation;Demonstration;Tactile cues;Verbal cues    Comprehension Verbalized understanding;Returned demonstration;Verbal cues required;Tactile cues required            PT Short Term Goals - 03/05/20 1838      PT SHORT TERM GOAL #1   Title Be independent with initial home exercise program for self-management of symptoms.    Baseline introduced pacing (03/05/2020);    Time 3    Period Weeks    Status New    Target Date 03/26/20             PT Long Term Goals - 03/05/20 1839      PT LONG TERM GOAL #1   Title Be independent with a long-term home exercise program for self-management of symptoms.    Baseline introduced pacing (03/05/2020);    Time  12    Period Weeks    Status New   TARGET DATE FOR ALL LONG TERM GOALS: 05/28/2020     PT LONG TERM GOAL #2   Title Demonstrate improved FOTO score to 65 by visit #10 demonstrate improvement in overall condition and self-reported functional ability.    Baseline 51 (03/05/2020);    Time 12    Period Weeks    Status New      PT LONG TERM GOAL #3   Title Patient will improve 6 minute walk test to equal or greater than 1400 feet with no assistive device to demonstrate improved community ambulation for improved abliity to return to work (03/05/2020);    Baseline 800 feet and unable to walk longer than 5 minutes (03/05/2020);    Time 12    Period Weeks    Status New      PT LONG TERM GOAL #4   Title Pateint will complete 5 Time Sit To Stand Test in equal or less than 12 seconds from 18.5 inch surface with no UE support to demonstrate improved LE muscle performance to improve ability to perform household chores and work activities    Baseline 17 seconds from 18.5 inch plinth, no UE support (03/05/2020);    Time 12    Period Weeks    Status New     Target Date 05/28/20      PT LONG TERM GOAL #5   Title Complete community, work and/or recreational activities without limitation due to current condition    Baseline difficulty with all aspects of her life. Unable to work, difficulty with ADLs, dressing, showering, washing the clothes, caring for her children, household and community ambulation, IADLs, difficulty with decision making and executive function tasks (03/05/2020);    Time 12    Period Weeks    Status New                 Plan - 04/11/20 0946    Clinical Impression Statement Patient tolerated treatment well overall with usual difficulties with fatigue. Provided manual therapy in attempt to improve back and hip pain. Patient reports pain has progressed to bilateral hips and glutes. Did report some relief from joint mobilizations and improved effectiveness of lumbar extension exercises. Advised on ways to reduce or interrupt prolonged flexion in daily life for improved pain control. Patient would benefit from continued management of limiting condition by skilled physical therapist to address remaining impairments and functional limitations to work towards stated goals and return to PLOF or maximal functional independence.    Personal Factors and Comorbidities Age;Comorbidity 3+;Time since onset of injury/illness/exacerbation;Past/Current Experience;Fitness    Comorbidities Relevant past medical history and comorbidities include hypertension, graves disease, anemia, asthma, leg pain (no dx, takes lyrica), hand pain (went to see if she had RA, but said she did not).    Examination-Activity Limitations Bathing;Hygiene/Grooming;Bed Mobility;Lift;Stairs;Locomotion Level;Stand;Caring for Others;Carry;Dressing    Examination-Participation Restrictions Laundry;Cleaning;Shop;Community Activity;Meal Prep;Driving;Occupation;Yard Work;Interpersonal Relationship   difficulty with all aspects of her life. Unable to work, difficulty with ADLs,  dressing, showering, washing the clothes, caring for her children, household and community ambulation, IADLs, difficulty with decision making and executive function tasks.   Stability/Clinical Decision Making Evolving/Moderate complexity    Rehab Potential Good    PT Frequency 2x / week    PT Duration 12 weeks    PT Treatment/Interventions ADLs/Self Care Home Management;Aquatic Therapy;Moist Heat;Cryotherapy;Therapeutic activities;Therapeutic exercise;Neuromuscular re-education;Cognitive remediation;Patient/family education;Manual techniques;Dry needling;Passive range of motion;Energy conservation;Spinal Manipulations;Joint Manipulations    PT Next  Visit Plan pacing, low rep, high resistance strength training as tolerated, low duration endurance intervals    PT Home Exercise Plan Medbridge Access Code: [email or web address]    Consulted and Agree with Plan of Care Patient           Patient will benefit from skilled therapeutic intervention in order to improve the following deficits and impairments:  Decreased cognition,Impaired sensation,Pain,Cardiopulmonary status limiting activity,Decreased mobility,Impaired perceived functional ability,Decreased strength,Decreased range of motion,Decreased endurance,Decreased activity tolerance,Difficulty walking,Obesity  Visit Diagnosis: Muscle weakness (generalized)  Other symptoms and signs involving the musculoskeletal system  Difficulty in walking, not elsewhere classified  Cervicalgia     Problem List Patient Active Problem List   Diagnosis Date Noted  . History of COVID-19 02/28/2020  . 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 Memory loss 02/28/2020  . Physical deconditioning 02/28/2020  . Dyspnea on exertion 02/28/2020  . Neck pain, bilateral 02/28/2020  . Muscle spasms of neck 02/28/2020  . Gallstones 01/30/2020  . COVID-19 01/23/2020  . Arthralgia of hand 01/24/2019  . CRP elevated 01/24/2019  . ESR raised 01/24/2019  . Class 3 severe obesity due  to excess calories without serious comorbidity in adult ([place]) 01/24/2019  . Iron deficiency anemia   . Palpitations 09/16/2018  . Coronary artery calcification 09/16/2018  . 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 Bilateral leg edema 04/20/2017  . Incidental lung nodule, > 3m and < 812m11/28/2018  . Hives 09/02/2015  . Morbid [place] ([place]/13/2017  . Anxiety 07/09/2015  . 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 Thyroid nodule 02/01/2015  . Vitamin D deficiency disease   . Vitamin B12 deficiency   . History of abnormal cervical Pap smear   . Allergy-induced asthma, mild intermittent, uncomplicated 0613/12/6576  SaEverlean AlstromSnGraylon GoodPT, DPT 04/11/20, 9:47 AM  [facility] [address]hone: [phone] Fax:  [phone]: Catherine HAUGHNRN: [id number] of Birth: 9/October 27, 1980

## 2020-04-16 ENCOUNTER — Other Ambulatory Visit: Payer: Self-pay

## 2020-04-16 ENCOUNTER — Ambulatory Visit: Payer: Managed Care, Other (non HMO) | Admitting: Physical Therapy

## 2020-04-16 ENCOUNTER — Encounter: Payer: Self-pay | Admitting: Physical Therapy

## 2020-04-16 DIAGNOSIS — R29898 Other symptoms and signs involving the musculoskeletal system: Secondary | ICD-10-CM

## 2020-04-16 DIAGNOSIS — M6281 Muscle weakness (generalized): Secondary | ICD-10-CM | POA: Diagnosis not present

## 2020-04-16 DIAGNOSIS — R262 Difficulty in walking, not elsewhere classified: Secondary | ICD-10-CM

## 2020-04-16 DIAGNOSIS — M542 Cervicalgia: Secondary | ICD-10-CM

## 2020-04-16 NOTE — Therapy (Signed)
[facility] [address] [place], [address] Phone: [phone]   Fax:  [phone]  Physical Therapy Treatment / Progress Note Reporting period: 03/05/2020 - 04/16/2020  Patient Details  Name: Catherine Berg MRN: [id number] Date of Birth: 1980-09-08 Referring Provider (PT): Kriste Basque. Nils Pyle, NP   Encounter Date: 04/16/2020   PT End of Session - 04/16/20 1053    Visit Number 10    Number of Visits 24    Date for PT Re-Evaluation 05/28/20    Authorization Type [employer] MANAGED reporting period from 03/05/2020    PT Start Time 1037    PT Stop Time 1115    PT Time Calculation (min) 38 min    Activity Tolerance Patient limited by fatigue    Behavior During Therapy [facility] for tasks assessed/performed           Past Medical History:  Diagnosis Date  . Abnormal thyroid blood test   . Anemia   . Anxiety   . Asthma   . Complication of anesthesia    itching after c sections  . 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 Graves disease   . History of abnormal cervical Pap smear   . History of cervical polypectomy   . Hives 09/02/2015  . Hypertension   . IFG (impaired fasting glucose)   . Incidental lung nodule, > 73m and < 823m11/28/2018   4 mm RLL lung nodule on chest CT Mar 31, 2017  . Low serum vitamin D   . Obesity   . Pregnancy induced hypertension    with 1st pregnancy, normal pressure with 2nd  . Reflux   . Thyroid disease   . Vitamin B12 deficiency   . Vitamin D deficiency disease   . Wears dentures    full upper    Past Surgical History:  Procedure Laterality Date  . CERVICAL POLYPECTOMY    . CESAREAN SECTION     x 2  . COLONOSCOPY WITH PROPOFOL N/A 11/14/2018   Procedure: COLONOSCOPY WITH PROPOFOL;  Surgeon: TaVirgel ManifoldMD;  [facility] [facility]: [place];  Laterality: N/A;  . ESOPHAGOGASTRODUODENOSCOPY (EGD) WITH PROPOFOL N/A 11/14/2018    Procedure: ESOPHAGOGASTRODUODENOSCOPY (EGD) WITH PROPOFOL;  Surgeon: TaVirgel ManifoldMD;  [place] [facility] [place]: [place];  Laterality: N/A;  Latex allergy  . GIVENS CAPSULE STUDY N/A 12/27/2018   Procedure: GIVENS CAPSULE STUDY;  Surgeon: TaVirgel ManifoldMD;  Location: [facility] ENDOSCOPY;  Service: Endoscopy;  Laterality: N/A;    There were no vitals filed for this visit.   Subjective Assessment - 04/16/20 1039    Subjective Patient reports she is rushing and is behind 7 min late. Rates her low back and hip pain is better today 2/10 and comes and goes and shifts sides. Thinks the exercises are helping it with decreased pain following the exercise that hurts while performing. Fatigue has been okay. Yesterday she was really tired even though she didn't do much. Does seem to come and go more but she feel that she has more days she is fatigued than when she is not. She reports she is unsure how she felt the day after her last session. Has been performing HEP as able.    Pertinent History Patient is a [age].o. female who presents to outpatient physical therapy with a referral for medical diagnosis history of COVID-19, physical deconditioning. This patient's chief complaints consist of decreased  activity tolerance, fatigue, poor endurance, and difficulty with cognition leading to the following functional deficits: difficulty with all aspects of her life. Unable to work, difficulty with ADLs, dressing, showering, washing the clothes, caring for her children, household and community ambulation, IADLs, difficulty with decision making and tasks requring executive function. Relevant past medical history and comorbidities include hypertension, graves disease, anemia, asthma, leg pain (no dx, takes lyrica), hand pain (went to see if she had RA, but said she did not).  Patient denies hx of cancer, stroke, seizures, major cardiac events, diabetes, unexplained weight loss, changes in bowel or  bladder problems, new onset stumbling or dropping things.    How long can you walk comfortably? 15 minutes    Patient Stated Goals "get back to normal"    Currently in Pain? Yes    Pain Score 2     Pain Location Back    Pain Orientation Right;Left          OBJECTIVE FOTO = 48 (04/16/2020);   FUNCTIONAL/BALANCE TESTS:  Five Time Sit to Stand (5TSTS): 12 seconds from 18.5 inch plinth with no UE support.   Six Minute Walk Test (6MWT): 1250 feet    TREATMENT:  Therapeutic exercise:to centralize symptoms and improve ROM, strength, muscular endurance, and activity tolerance required for successful completion of functional activities. -ambulation around clinic for distance in 6 minutes to assess progress (see 6 Minute Walk Test above).  - sit <> stand x 5 from 18.5 inch surface, for speed to assess progress (see 5TSTS test above).  - seated hamstring curl on omega machine, 35#, 3x10  - Total gym squats as deep as possible at level 25, 3x15  Pt required multimodal cuing for proper technique and to facilitate improved neuromuscular control, strength, range of motion, and functional ability resulting in improved performance and form.   HOME EXERCISE PROGRAM Access Code: [email or web address] URL: [email or web address] Date: 04/11/2020 Prepared by: [name]  Program Notes Try to complete daily tasks in 2 minute intervals with 1-2 min seated rest every 2 minutes. This will be annoying and a challenge.  Try to track how your energy feels overall throughout the day if you complete your tasks this way versus just doing each task non-stop until it is complete.  Get a chair to sit on in the shower to decrease energy cost while bathing.   Exercises Walking Seated Correct Posture Prone Press Up - 4 x daily - 10-15 reps - 1 second hold Standing Lumbar Extension - 4 x daily - 10-15 reps - 1 sets - 1 second hold     PT Education - 04/16/20 1052    Education Details  Exercise purpose/form. Self-management techniques    Person(s) Educated Patient    Methods Explanation;Demonstration;Verbal cues    Comprehension Verbalized understanding;Returned demonstration;Verbal cues required;Need further instruction            PT Short Term Goals - 04/16/20 1247      PT SHORT TERM GOAL #1   Title Be independent with initial home exercise program for self-management of symptoms.    Baseline introduced pacing (03/05/2020);    Time 3    Period Weeks    Status Achieved    Target Date 03/26/20             PT Long Term Goals - 04/16/20 1102      PT LONG TERM GOAL #1   Title Be independent with a long-term home exercise program for self-management of symptoms.    Baseline  introduced pacing (03/05/2020); participating in HEP as able (04/16/2020);    Time 12    Period Weeks    Status Partially Met   TARGET DATE FOR ALL LONG TERM GOALS: 05/28/2020     PT LONG TERM GOAL #2   Title Demonstrate improved FOTO score to 65 by visit #10 demonstrate improvement in overall condition and self-reported functional ability.    Baseline 51 (03/05/2020); 48 (04/16/2020);    Time 12    Period Weeks    Status On-going      PT LONG TERM GOAL #3   Title Patient will improve 6 minute walk test to equal or greater than 1400 feet with no assistive device to demonstrate improved community ambulation for improved abliity to return to work (03/05/2020);    Baseline 800 feet and unable to walk longer than 5 minutes (03/05/2020); 1250 feet (04/16/2020);    Time 12    Period Weeks    Status Partially Met      PT LONG TERM GOAL #4   Title Pateint will complete 5 Time Sit To Stand Test in equal or less than 12 seconds from 18.5 inch surface with no UE support to demonstrate improved LE muscle performance to improve ability to perform household chores and work activities    Baseline 17 seconds from 18.5 inch plinth, no UE support (03/05/2020); 12 seconds from 18.5 inch plinth with no UE  support (04/16/2020);    Time 12    Period Weeks    Status Achieved      PT LONG TERM GOAL #5   Title Complete community, work and/or recreational activities without limitation due to current condition    Baseline difficulty with all aspects of her life. Unable to work, difficulty with ADLs, dressing, showering, washing the clothes, caring for her children, household and community ambulation, IADLs, difficulty with decision making and executive function tasks (03/05/2020); continues to struggle with similar problems (04/16/2020);    Time 12    Period Weeks    Status On-going                 Plan - 04/16/20 1246    Clinical Impression Statement Patient has attended 10 physical therapy sessions and has made progress towards her 6 Minute Walk Test goal and met her 5 Times Sit to Stand goal. She cannot tell that she is having an improvement in her functional limitations at home and her FOTO score has not improved. She feels she is benefiting from PT that is improving the frequency of physical activity and getting her out of the house, but she continues to struggle with symptoms of fatigue and cognitive difficulty that often prevent her from remembering how she felt with exercise or the previous day's events. Patient would benefit from continued management of limiting condition by skilled physical therapist to address remaining impairments and functional limitations to work towards stated goals and return to PLOF or maximal functional independence. She would also benefit from evaluation and treatment of cognition difficulties by a speech-language pathologist. Referral form has been sent to referring clinician for their consideration and signature if they agree.    Personal Factors and Comorbidities Age;Comorbidity 3+;Time since onset of injury/illness/exacerbation;Past/Current Experience;Fitness    Comorbidities Relevant past medical history and comorbidities include hypertension, graves disease,  anemia, asthma, leg pain (no dx, takes lyrica), hand pain (went to see if she had RA, but said she did not).    Examination-Activity Limitations Bathing;Hygiene/Grooming;Bed Mobility;Lift;Stairs;Locomotion Level;Stand;Caring for Others;Carry;Dressing  Examination-Participation Restrictions Laundry;Cleaning;Shop;Community Activity;Meal Prep;Driving;Occupation;Yard Work;Interpersonal Relationship   difficulty with all aspects of her life. Unable to work, difficulty with ADLs, dressing, showering, washing the clothes, caring for her children, household and community ambulation, IADLs, difficulty with decision making and executive function tasks.   Stability/Clinical Decision Making Evolving/Moderate complexity    Rehab Potential Good    PT Frequency 2x / week    PT Duration 12 weeks    PT Treatment/Interventions ADLs/Self Care Home Management;Aquatic Therapy;Moist Heat;Cryotherapy;Therapeutic activities;Therapeutic exercise;Neuromuscular re-education;Cognitive remediation;Patient/family education;Manual techniques;Dry needling;Passive range of motion;Energy conservation;Spinal Manipulations;Joint Manipulations    PT Next Visit Plan pacing, low rep, high resistance strength training as tolerated, low duration endurance intervals    PT Home Exercise Plan Medbridge Access Code: [email or web address]    Recommended Other Services evaluation and treatment by Speech Language Pathologist for ongoing cognitive difficulty    Consulted and Agree with Plan of Care Patient           Patient will benefit from skilled therapeutic intervention in order to improve the following deficits and impairments:  Decreased cognition,Impaired sensation,Pain,Cardiopulmonary status limiting activity,Decreased mobility,Impaired perceived functional ability,Decreased strength,Decreased range of motion,Decreased endurance,Decreased activity tolerance,Difficulty walking,Obesity  Visit Diagnosis: Muscle weakness (generalized)  Other  symptoms and signs involving the musculoskeletal system  Difficulty in walking, not elsewhere classified  Cervicalgia     Problem List Patient Active Problem List   Diagnosis Date Noted  . History of COVID-19 02/28/2020  . 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 Memory loss 02/28/2020  . Physical deconditioning 02/28/2020  . Dyspnea on exertion 02/28/2020  . Neck pain, bilateral 02/28/2020  . Muscle spasms of neck 02/28/2020  . Gallstones 01/30/2020  . COVID-19 01/23/2020  . Arthralgia of hand 01/24/2019  . CRP elevated 01/24/2019  . ESR raised 01/24/2019  . Class 3 severe obesity due to excess calories without serious comorbidity in adult ([place]) 01/24/2019  . Iron deficiency anemia   . Palpitations 09/16/2018  . Coronary artery calcification 09/16/2018  . 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 Bilateral leg edema 04/20/2017  . Incidental lung nodule, > 2m and < 869m11/28/2018  . Hives 09/02/2015  . Morbid obesity ([facility]04/13/2017  . Anxiety 07/09/2015  . 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 Thyroid nodule 02/01/2015  . Vitamin D deficiency disease   . Vitamin B12 deficiency   . History of abnormal cervical Pap smear   . Allergy-induced asthma, mild intermittent, uncomplicated 0625/36/6440  SaEverlean AlstromSnGraylon GoodPT, DPT 04/16/20, 12:49 PM  [facility] [id number]: [phone] Fax:  [phone]: TaDONELLE HISERN: [id number] of Birth: 9/06-Sep-1980

## 2020-04-18 ENCOUNTER — Ambulatory Visit: Payer: Managed Care, Other (non HMO) | Admitting: Physical Therapy

## 2020-04-18 ENCOUNTER — Other Ambulatory Visit: Payer: Self-pay

## 2020-04-18 ENCOUNTER — Encounter: Payer: Self-pay | Admitting: Physical Therapy

## 2020-04-18 DIAGNOSIS — M6281 Muscle weakness (generalized): Secondary | ICD-10-CM | POA: Diagnosis not present

## 2020-04-18 DIAGNOSIS — M542 Cervicalgia: Secondary | ICD-10-CM

## 2020-04-18 DIAGNOSIS — R262 Difficulty in walking, not elsewhere classified: Secondary | ICD-10-CM

## 2020-04-18 DIAGNOSIS — R29898 Other symptoms and signs involving the musculoskeletal system: Secondary | ICD-10-CM

## 2020-04-18 NOTE — Therapy (Signed)
[facility] [address] [place], [address] Phone: [phone]   Fax:  [phone]  Physical Therapy Treatment  Patient Details  Name: Catherine Berg MRN: [id number] Date of Birth: [birth year] Referring Provider (PT): Kriste Basque. Nils Pyle, NP   Encounter Date: 04/18/2020   PT End of Session - 04/18/20 0907    Visit Number 11    Number of Visits 24    Date for PT Re-Evaluation 05/28/20    Authorization Type [employer] MANAGED reporting period from 04/18/2020    Progress Note Due on Visit 20    PT Start Time 0903    PT Stop Time 0943    PT Time Calculation (min) 40 min    Activity Tolerance Patient limited by fatigue    Behavior During Therapy [facility] for tasks assessed/performed           Past Medical History:  Diagnosis Date  . Abnormal thyroid blood test   . Anemia   . Anxiety   . Asthma   . Complication of anesthesia    itching after c sections  . 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 Graves disease   . History of abnormal cervical Pap smear   . History of cervical polypectomy   . Hives 09/02/2015  . Hypertension   . IFG (impaired fasting glucose)   . Incidental lung nodule, > 69m and < 837m11/28/2018   4 mm RLL lung nodule on chest CT Mar 31, 2017  . Low serum vitamin D   . Obesity   . Pregnancy induced hypertension    with 1st pregnancy, normal pressure with 2nd  . Reflux   . Thyroid disease   . Vitamin B12 deficiency   . Vitamin D deficiency disease   . Wears dentures    full upper    Past Surgical History:  Procedure Laterality Date  . CERVICAL POLYPECTOMY    . CESAREAN SECTION     x 2  . COLONOSCOPY WITH PROPOFOL N/A 11/14/2018   Procedure: COLONOSCOPY WITH PROPOFOL;  Surgeon: TaVirgel ManifoldMD;  [facility] Service: Endoscopy;  Laterality: N/A;  . ESOPHAGOGASTRODUODENOSCOPY (EGD) WITH PROPOFOL N/A 11/14/2018   Procedure:  ESOPHAGOGASTRODUODENOSCOPY (EGD) WITH PROPOFOL;  Surgeon: TaVirgel ManifoldMD;  [facility] Service: Endoscopy;  Laterality: N/A;  Latex allergy  . GIVENS CAPSULE STUDY N/A 12/27/2018   Procedure: GIVENS CAPSULE STUDY;  Surgeon: TaVirgel ManifoldMD;  Location: [facility] ENDOSCOPY;  Service: Endoscopy;  Laterality: N/A;    There were no vitals filed for this visit.   Subjective Assessment - 04/18/20 0906    Subjective Patient report she was very sleepy yesterday and slept most of the day. She also started doing her walking and it seemed to go okay. She states she has a little bit of pain in her left lower back to hip region rated 4/10. she states her fatigue level is 4/10 (where 10 is the worst). Has not done any walking for exercise today.    Pertinent History Patient is a [age].o. female who presents to outpatient physical therapy with a referral for medical diagnosis history of COVID-19, physical deconditioning. This patient's chief complaints consist of decreased activity tolerance, fatigue, poor endurance, and difficulty with cognition leading to the following functional deficits: difficulty with all aspects of her life. Unable to work, difficulty with ADLs, dressing, showering, washing the clothes, caring for her children, household  and community ambulation, IADLs, difficulty with decision making and tasks requring executive function. Relevant past medical history and comorbidities include hypertension, graves disease, anemia, asthma, leg pain (no dx, takes lyrica), hand pain (went to see if she had RA, but said she did not).  Patient denies hx of cancer, stroke, seizures, major cardiac events, diabetes, unexplained weight loss, changes in bowel or bladder problems, new onset stumbling or dropping things.    How long can you walk comfortably? 15 minutes    Patient Stated Goals "get back to normal"    Currently in Pain? Yes    Pain Score 2             TREATMENT:  Therapeutic exercise:to centralize symptoms and improve ROM, strength, muscular endurance, and activity tolerance required for successful completion of functional activities. -ambulation around clinic for distance in 6 minutes.  - standing dead lift with 20# KB, 3x10 (self selected rest).  - seated chest press, 3x10, at 15# (self selected rest) - runner's step up to 8 inch step with unilateral UE hold, 2x10 each side. (self selected rest) - seated lat pull, 3x10, at 20# (self-selected rest) - squat to chair tap at 18 inch chair, 3x10  Pt required multimodal cuing for proper technique and to facilitate improved neuromuscular control, strength, range of motion, and functional ability resulting in improved performance and form.   HOME EXERCISE PROGRAM Access Code: [email or web address] URL: [email or web address] Date: 04/11/2020 Prepared by: [name]  Program Notes Try to complete daily tasks in 2 minute intervals with 1-2 min seated rest every 2 minutes. This will be annoying and a challenge.  Try to track how your energy feels overall throughout the day if you complete your tasks this way versus just doing each task non-stop until it is complete.  Get a chair to sit on in the shower to decrease energy cost while bathing.   Exercises Walking Seated Correct Posture Prone Press Up - 4 x daily - 10-15 reps - 1 second hold Standing Lumbar Extension - 4 x daily - 10-15 reps - 1 sets - 1 second hold    PT Education - 04/18/20 0907    Education Details Exercise purpose/form. Self-management techniques    Person(s) Educated Patient    Methods Explanation;Demonstration;Verbal cues    Comprehension Verbalized understanding;Returned demonstration;Verbal cues required;Need further instruction            PT Short Term Goals - 04/16/20 1247      PT SHORT TERM GOAL #1   Title Be independent with initial home exercise program for self-management of symptoms.     Baseline introduced pacing (03/05/2020);    Time 3    Period Weeks    Status Achieved    Target Date 03/26/20             PT Long Term Goals - 04/16/20 1102      PT LONG TERM GOAL #1   Title Be independent with a long-term home exercise program for self-management of symptoms.    Baseline introduced pacing (03/05/2020); participating in HEP as able (04/16/2020);    Time 12    Period Weeks    Status Partially Met   TARGET DATE FOR ALL LONG TERM GOALS: 05/28/2020     PT LONG TERM GOAL #2   Title Demonstrate improved FOTO score to 65 by visit #10 demonstrate improvement in overall condition and self-reported functional ability.    Baseline 51 (03/05/2020); 48 (04/16/2020);    Time 12  Period Weeks    Status On-going      PT LONG TERM GOAL #3   Title Patient will improve 6 minute walk test to equal or greater than 1400 feet with no assistive device to demonstrate improved community ambulation for improved abliity to return to work (03/05/2020);    Baseline 800 feet and unable to walk longer than 5 minutes (03/05/2020); 1250 feet (04/16/2020);    Time 12    Period Weeks    Status Partially Met      PT LONG TERM GOAL #4   Title Pateint will complete 5 Time Sit To Stand Test in equal or less than 12 seconds from 18.5 inch surface with no UE support to demonstrate improved LE muscle performance to improve ability to perform household chores and work activities    Baseline 17 seconds from 18.5 inch plinth, no UE support (03/05/2020); 12 seconds from 18.5 inch plinth with no UE support (04/16/2020);    Time 12    Period Weeks    Status Achieved      PT LONG TERM GOAL #5   Title Complete community, work and/or recreational activities without limitation due to current condition    Baseline difficulty with all aspects of her life. Unable to work, difficulty with ADLs, dressing, showering, washing the clothes, caring for her children, household and community ambulation, IADLs, difficulty  with decision making and executive function tasks (03/05/2020); continues to struggle with similar problems (04/16/2020);    Time 12    Period Weeks    Status On-going                 Plan - 04/18/20 0946    Clinical Impression Statement Patient tolerated treatment well overall with limitations from fatigue. Continued to increase intensity and difficulty level of conditioning and strengthening exercises. Will continue to monitor response over time and adjust interventions as appropriate. Patient would benefit from continued management of limiting condition by skilled physical therapist to address remaining impairments and functional limitations to work towards stated goals and return to PLOF or maximal functional independence.    Personal Factors and Comorbidities Age;Comorbidity 3+;Time since onset of injury/illness/exacerbation;Past/Current Experience;Fitness    Comorbidities Relevant past medical history and comorbidities include hypertension, graves disease, anemia, asthma, leg pain (no dx, takes lyrica), hand pain (went to see if she had RA, but said she did not).    Examination-Activity Limitations Bathing;Hygiene/Grooming;Bed Mobility;Lift;Stairs;Locomotion Level;Stand;Caring for Others;Carry;Dressing    Examination-Participation Restrictions Laundry;Cleaning;Shop;Community Activity;Meal Prep;Driving;Occupation;Yard Work;Interpersonal Relationship   difficulty with all aspects of her life. Unable to work, difficulty with ADLs, dressing, showering, washing the clothes, caring for her children, household and community ambulation, IADLs, difficulty with decision making and executive function tasks.   Stability/Clinical Decision Making Evolving/Moderate complexity    Rehab Potential Good    PT Frequency 2x / week    PT Duration 12 weeks    PT Treatment/Interventions ADLs/Self Care Home Management;Aquatic Therapy;Moist Heat;Cryotherapy;Therapeutic activities;Therapeutic  exercise;Neuromuscular re-education;Cognitive remediation;Patient/family education;Manual techniques;Dry needling;Passive range of motion;Energy conservation;Spinal Manipulations;Joint Manipulations    PT Next Visit Plan pacing, low rep, high resistance strength training as tolerated, low duration endurance intervals    PT Home Exercise Plan Medbridge Access Code: [email or web address]    Consulted and Agree with Plan of Care Patient           Patient will benefit from skilled therapeutic intervention in order to improve the following deficits and impairments:  Decreased cognition,Impaired sensation,Pain,Cardiopulmonary status limiting activity,Decreased mobility,Impaired perceived functional ability,Decreased strength,Decreased range of  motion,Decreased endurance,Decreased activity tolerance,Difficulty walking,Obesity  Visit Diagnosis: Muscle weakness (generalized)  Other symptoms and signs involving the musculoskeletal system  Difficulty in walking, not elsewhere classified  Cervicalgia     Problem List Patient Active Problem List   Diagnosis Date Noted  . History of COVID-19 02/28/2020  . 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 Memory loss 02/28/2020  . Physical deconditioning 02/28/2020  . Dyspnea on exertion 02/28/2020  . Neck pain, bilateral 02/28/2020  . Muscle spasms of neck 02/28/2020  . Gallstones 01/30/2020  . COVID-19 01/23/2020  . Arthralgia of hand 01/24/2019  . CRP elevated 01/24/2019  . ESR raised 01/24/2019  . Class 3 severe obesity due to excess calories without serious comorbidity in adult ([place]) 01/24/2019  . Iron deficiency anemia   . Palpitations 09/16/2018  . Coronary artery calcification 09/16/2018  . 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 Bilateral leg edema 04/20/2017  . Incidental lung nodule, > 74m and < 825m11/28/2018  . Hives 09/02/2015  . Morbid obesity ([facility]04/13/2017  . Anxiety 07/09/2015  . 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 Thyroid nodule  02/01/2015  . Vitamin D deficiency disease   . Vitamin B12 deficiency   . History of abnormal cervical Pap smear   . Allergy-induced asthma, mild intermittent, uncomplicated 0625/67/2091  SaEverlean AlstromSnGraylon GoodPT, DPT 04/18/20, 9:46 AM  [facility] [id number]: [phone] Fax:  [phone]: TaSENIYA STOFFERSRN: [id number] of Birth: 9/Nov 19, 1980

## 2020-04-22 ENCOUNTER — Ambulatory Visit (INDEPENDENT_AMBULATORY_CARE_PROVIDER_SITE_OTHER): Payer: Managed Care, Other (non HMO) | Admitting: Nurse Practitioner

## 2020-04-22 VITALS — HR 72 | Temp 97.3°F

## 2020-04-22 DIAGNOSIS — Z8616 Personal history of COVID-19: Secondary | ICD-10-CM | POA: Diagnosis not present

## 2020-04-22 DIAGNOSIS — R5382 Chronic fatigue, unspecified: Secondary | ICD-10-CM

## 2020-04-22 DIAGNOSIS — E559 Vitamin D deficiency, unspecified: Secondary | ICD-10-CM | POA: Diagnosis not present

## 2020-04-22 DIAGNOSIS — U099 Post covid-19 condition, unspecified: Secondary | ICD-10-CM | POA: Diagnosis not present

## 2020-04-22 NOTE — Progress Notes (Signed)
@Patient  ID: , female    DOB: [birth year], 39 y.o.   MRN: [id number]  Chief Complaint  Patient presents with  . Follow-up    Improving, still very fatigued     Referring provider: [id number], MD   39 year old female with history of hypertension, asthma, thyroid disease.   HPI  Patient presents today for post COVID care clinic visit follow-up.  She was last seen here on 02/28/2020.  She has been participating in physical therapy.  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  She states that physical therapy has been helping.  Her shortness of breath is improved.  She does have an upcoming appointment with neurology in January.  Patient does still complain of severe fatigue. Denies f/c/s, n/v/d, hemoptysis, PND, chest pain or edema.      Allergies  Allergen Reactions  . Latex Hives    (Balloons, condoms, underwear elastic, gloves)  . Shellfish Allergy Hives    Immunization History  Administered Date(s) Administered  . Hepatitis A 01/31/2007, 08/10/2007  . Hepatitis B 01/31/2007, 08/10/2007, 07/02/2008  . Influenza,inj,Quad PF,6+ Mos 03/15/2018, 01/05/2019, 03/06/2020  . Td 10/29/1995, 08/03/2017  . Tdap 08/10/2007    Past Medical History:  Diagnosis Date  . Abnormal thyroid blood test   . Anemia   . Anxiety   . Asthma   . Complication of anesthesia    itching after c sections  . 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 Graves disease   . History of abnormal cervical Pap smear   . History of cervical polypectomy   . Hives 09/02/2015  . Hypertension   . IFG (impaired fasting glucose)   . Incidental lung nodule, > 39mm and < 60mm 03/31/2017   4 mm RLL lung nodule on chest CT Mar 31, 2017  . Low serum vitamin D   . Obesity   . Pregnancy induced hypertension    with 1st pregnancy, normal pressure with 2nd  . Reflux   . Thyroid disease   . Vitamin B12  deficiency   . Vitamin D deficiency disease   . Wears dentures    full upper    Tobacco History: Social History   Tobacco Use  Smoking Status Never Smoker  Smokeless Tobacco Never Used   Counseling given: Yes   Outpatient Encounter Medications as of 04/22/2020  Medication Sig  . albuterol (PROVENTIL HFA;VENTOLIN HFA) 108 (90 Base) MCG/ACT inhaler Inhale 2 puffs into the lungs every 6 (six) hours as needed for wheezing or shortness of breath.  . amphetamine-dextroamphetamine (ADDERALL XR) 10 MG 24 hr capsule Take 1 capsule (10 mg total) by mouth daily.  . clobetasol (TEMOVATE) 0.05 % external solution Apply 1 application topically 2 (two) times daily.  . Cyanocobalamin (B-12) 1000 MCG SUBL Place 1 tablet under the tongue daily.  . DULoxetine (CYMBALTA) 30 MG capsule Take 1 capsule (30 mg total) by mouth daily. First week take one after that two daily  . Elastic Bandages & Supports (MEDICAL COMPRESSION STOCKINGS) MISC Wear from morning to night, but do not sleep in these  . Etonogestrel (NEXPLANON [place]) Inject into the skin.  . famotidine (PEPCID) 20 MG tablet Take 1 tablet (20 mg total) by mouth daily.  . fluticasone (FLONASE) 50 MCG/ACT nasal spray Place 2 sprays into both nostrils daily.  . hydrOXYzine (ATARAX/VISTARIL) 25 MG tablet Take 1 tablet (25 mg total) by mouth 3 (three) times daily  as needed.  Marland Kitchen ibuprofen (ADVIL) 800 MG tablet Take 1 tablet (800 mg total) by mouth every 8 (eight) hours as needed.  Marland Kitchen levothyroxine (SYNTHROID) 50 MCG tablet Take 75 mcg by mouth daily.  . montelukast (SINGULAIR) 10 MG tablet Take 1 tablet (10 mg total) by mouth at bedtime.  . pregabalin (LYRICA) 75 MG capsule Take 1 capsule (75 mg total) by mouth 2 (two) times daily.  Marland Kitchen tiZANidine (ZANAFLEX) 4 MG tablet Take 0.5-1.5 tablets (2-6 mg total) by mouth every 8 (eight) hours as needed for muscle spasms (muscle tightness).  . traZODone (DESYREL) 50 MG tablet Take 0.5-1 tablets (25-50 mg total) by mouth  at bedtime as needed for sleep.  . Vitamin D, Ergocalciferol, (DRISDOL) 1.25 MG (50000 UT) CAPS capsule Take 1 capsule (50,000 Units total) by mouth every 7 (seven) days.   No facility-administered encounter medications on file as of 04/22/2020.     Review of Systems  Review of Systems  Constitutional: Positive for fatigue. Negative for fever.  HENT: Negative.   Respiratory: Positive for shortness of breath. Negative for cough.   Cardiovascular: Negative.  Negative for chest pain, palpitations and leg swelling.  Gastrointestinal: Negative.   Allergic/Immunologic: Negative.   Neurological: Negative.   Psychiatric/Behavioral: Negative.        Physical Exam  Pulse 72   Temp (!) 97.3 F (36.3 C)   SpO2 100% Comment: RA  Wt Readings from Last 5 Encounters:  03/06/20 232 lb 8 oz (105.5 kg)  02/28/20 228 lb 0.1 oz (103.4 kg)  01/30/20 229 lb 14.4 oz (104.3 kg)  01/10/20 220 lb (99.8 kg)  10/31/19 227 lb 11.2 oz (103.3 kg)     Physical Exam Vitals and nursing note reviewed.  Constitutional:      General: She is not in acute distress.    Appearance: She is well-developed and well-nourished.  Cardiovascular:     Rate and Rhythm: Normal rate and regular rhythm.  Pulmonary:     Effort: Pulmonary effort is normal.     Breath sounds: Normal breath sounds.  Musculoskeletal:     Right lower leg: No edema.     Left lower leg: No edema.  Neurological:     Mental Status: She is alert and oriented to person, place, and time.  Psychiatric:        Mood and Affect: Mood and affect and mood normal.        Behavior: Behavior normal.        Assessment & Plan:   COVID-19 long hauler manifesting chronic fatigue Shortness of breath:   Stay well hydrated  Stay active  Deep breathing exercises  May take tylenol or fever or pain  Will order chest x ray  Fatigue Memory loss:  Continue PT  Order has been placed for speech - cognitive rehabilitation  Will  order labs    Memory loss Olfactory Dysfunction:  Keep appointment with neurology      Follow up:  Follow up in 2 months or sooner if needed      Ivonne Andrew, NP 04/22/2020

## 2020-04-22 NOTE — Patient Instructions (Signed)
History of COVID-19 Shortness of breath:   Stay well hydrated  Stay active  Deep breathing exercises  May take tylenol or fever or pain  Will order chest x ray  Fatigue Memory loss:  Continue PT  Order has been placed for speech - cognitive rehabilitation  Will order labs    Memory loss Olfactory Dysfunction:  Keep appointment with neurology      Follow up:  Follow up in 2 months or sooner if needed

## 2020-04-22 NOTE — Assessment & Plan Note (Signed)
Shortness of breath:   Stay well hydrated  Stay active  Deep breathing exercises  May take tylenol or fever or pain  Will order chest x ray  Fatigue Memory loss:  Continue PT  Order has been placed for speech - cognitive rehabilitation  Will order labs    Memory loss Olfactory Dysfunction:  Keep appointment with neurology      Follow up:  Follow up in 2 months or sooner if needed

## 2020-04-23 ENCOUNTER — Ambulatory Visit: Payer: Managed Care, Other (non HMO) | Admitting: Physical Therapy

## 2020-04-23 ENCOUNTER — Other Ambulatory Visit: Payer: Self-pay

## 2020-04-23 ENCOUNTER — Encounter: Payer: Self-pay | Admitting: Physical Therapy

## 2020-04-23 DIAGNOSIS — M6281 Muscle weakness (generalized): Secondary | ICD-10-CM | POA: Diagnosis not present

## 2020-04-23 DIAGNOSIS — R29898 Other symptoms and signs involving the musculoskeletal system: Secondary | ICD-10-CM

## 2020-04-23 DIAGNOSIS — M542 Cervicalgia: Secondary | ICD-10-CM

## 2020-04-23 DIAGNOSIS — R262 Difficulty in walking, not elsewhere classified: Secondary | ICD-10-CM

## 2020-04-23 NOTE — Therapy (Signed)
[facility] [address], [place], [address] Phone: [phone]   Fax:  [phone]  Physical Therapy Treatment  Patient Details  Name: ALEIYAH HALPIN MRN: [id number] Date of Birth: [birth year] Referring Provider (PT): Kriste Basque. Nils Pyle, NP   Encounter Date: 04/23/2020   PT End of Session - 04/23/20 1037    Visit Number 12    Number of Visits 24    Date for PT Re-Evaluation 05/28/20    Authorization Type [employer] MANAGED reporting period from 04/18/2020    Progress Note Due on Visit 20    PT Start Time 1030    PT Stop Time 1110    PT Time Calculation (min) 40 min    Activity Tolerance Patient limited by fatigue    Behavior During Therapy [facility] for tasks assessed/performed           Past Medical History:  Diagnosis Date  . Abnormal thyroid blood test   . Anemia   . Anxiety   . Asthma   . Complication of anesthesia    itching after c sections  . 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 Graves disease   . History of abnormal cervical Pap smear   . History of cervical polypectomy   . Hives 09/02/2015  . Hypertension   . IFG (impaired fasting glucose)   . Incidental lung nodule, > 51m and < 823m11/28/2018   4 mm RLL lung nodule on chest CT Mar 31, 2017  . Low serum vitamin D   . Obesity   . Pregnancy induced hypertension    with 1st pregnancy, normal pressure with 2nd  . Reflux   . Thyroid disease   . Vitamin B12 deficiency   . Vitamin D deficiency disease   . Wears dentures    full upper    Past Surgical History:  Procedure Laterality Date  . CERVICAL POLYPECTOMY    . CESAREAN SECTION     x 2  . COLONOSCOPY WITH PROPOFOL N/A 11/14/2018   Procedure: COLONOSCOPY WITH PROPOFOL;  Surgeon: TaVirgel ManifoldMD;  [facility];  Laterality: N/A;  . ESOPHAGOGASTRODUODENOSCOPY (EGD) WITH PROPOFOL N/A 11/14/2018   Procedure:  ESOPHAGOGASTRODUODENOSCOPY (EGD) WITH PROPOFOL;  Surgeon: TaVirgel ManifoldMD;  [facility]: [facility];  Laterality: N/A;  Latex allergy  . GIVENS CAPSULE STUDY N/A 12/27/2018   Procedure: GIVENS CAPSULE STUDY;  Surgeon: TaVirgel ManifoldMD;  Location: [facility];  Service: Endoscopy;  Laterality: N/A;    There were no vitals filed for this visit.   Subjective Assessment - 04/23/20 1034    Subjective Patient reports she is tired today but has not pain. States her referirng clinician put in the referral for speech therapy but she has not been contacted yet to set it up. State she was very sore and tired for about a day following her last PT session. Soreness was mostly in her legs. States she is ready to participate in PT and wants to do anything that will help her even if she gets sore. States pain comes and goes and she has not done much today so it is feeling fine. Patient helped prepare and distribute over 100 meals on Saturday which made her legs hurt on Sunday. She almost didn't get to church that day, but was able to push herself to do it. Patient had a follow up with the COVID 19 clinic  and was referred to speech therapy and reccomended for more blood work up to check for inflammatory markers. Wanted her to get a chest xray. Will see patient back in February.    Pertinent History Patient is a 39 y.o. female who presents to outpatient physical therapy with a referral for medical diagnosis history of COVID-19, physical deconditioning. This patient's chief complaints consist of decreased activity tolerance, fatigue, poor endurance, and difficulty with cognition leading to the following functional deficits: difficulty with all aspects of her life. Unable to work, difficulty with ADLs, dressing, showering, washing the clothes, caring for her children, household and community ambulation, IADLs, difficulty with decision making and tasks requring executive function.  Relevant past medical history and comorbidities include hypertension, graves disease, anemia, asthma, leg pain (no dx, takes lyrica), hand pain (went to see if she had RA, but said she did not).  Patient denies hx of cancer, stroke, seizures, major cardiac events, diabetes, unexplained weight loss, changes in bowel or bladder problems, new onset stumbling or dropping things.    How long can you walk comfortably? 15 minutes    Patient Stated Goals "get back to normal"    Currently in Pain? No/denies          TREATMENT:  Therapeutic exercise:to centralize symptoms and improve ROM, strength, muscular endurance, and activity tolerance required for successful completion of functional activities. -ambulation around clinic for distance in 6 minutes. (1221 feet)  - seated chest press, 3x10, at 15# (self selected rest) - standing dead lift with 20# KB, 3x10 (self selected rest).  - seated lat pull, 3x10, at 20# (self-selected rest) - runner's step up to 8 inch step with unilateral UE hold, 2x10 each side. (self selected rest) - seated bicep curls, 3x10 with 8#DB - squat to chair tap at 18 inch chair, 3x10  Pt required multimodal cuing for proper technique and to facilitate improved neuromuscular control, strength, range of motion, and functional ability resulting in improved performance and form.   HOME EXERCISE PROGRAM Access Code: [email or web address] URL: [email or web address] Date: 04/11/2020 Prepared by: [name]  Program Notes Try to complete daily tasks in 2 minute intervals with 1-2 min seated rest every 2 minutes. This will be annoying and a challenge.  Try to track how your energy feels overall throughout the day if you complete your tasks this way versus just doing each task non-stop until it is complete.  Get a chair to sit on in the shower to decrease energy cost while bathing.   Exercises Walking Seated Correct Posture Prone Press Up - 4 x daily - 10-15 reps -  1 second hold Standing Lumbar Extension - 4 x daily - 10-15 reps - 1 sets - 1 second hold    PT Education - 04/23/20 1037    Education Details Exercise purpose/form. Self-management techniques    Person(s) Educated Patient    Methods Explanation;Demonstration;Tactile cues;Verbal cues    Comprehension Verbalized understanding;Returned demonstration;Verbal cues required;Tactile cues required;Need further instruction            PT Short Term Goals - 04/16/20 1247      PT SHORT TERM GOAL #1   Title Be independent with initial home exercise program for self-management of symptoms.    Baseline introduced pacing (03/05/2020);    Time 3    Period Weeks    Status Achieved    Target Date 03/26/20             PT Long Term Goals - 04/16/20 1102  PT LONG TERM GOAL #1   Title Be independent with a long-term home exercise program for self-management of symptoms.    Baseline introduced pacing (03/05/2020); participating in HEP as able (04/16/2020);    Time 12    Period Weeks    Status Partially Met   TARGET DATE FOR ALL LONG TERM GOALS: 05/28/2020     PT LONG TERM GOAL #2   Title Demonstrate improved FOTO score to 65 by visit #10 demonstrate improvement in overall condition and self-reported functional ability.    Baseline 51 (03/05/2020); 48 (04/16/2020);    Time 12    Period Weeks    Status On-going      PT LONG TERM GOAL #3   Title Patient will improve 6 minute walk test to equal or greater than 1400 feet with no assistive device to demonstrate improved community ambulation for improved abliity to return to work (03/05/2020);    Baseline 800 feet and unable to walk longer than 5 minutes (03/05/2020); 1250 feet (04/16/2020);    Time 12    Period Weeks    Status Partially Met      PT LONG TERM GOAL #4   Title Pateint will complete 5 Time Sit To Stand Test in equal or less than 12 seconds from 18.5 inch surface with no UE support to demonstrate improved LE muscle performance to  improve ability to perform household chores and work activities    Baseline 17 seconds from 18.5 inch plinth, no UE support (03/05/2020); 12 seconds from 18.5 inch plinth with no UE support (04/16/2020);    Time 12    Period Weeks    Status Achieved      PT LONG TERM GOAL #5   Title Complete community, work and/or recreational activities without limitation due to current condition    Baseline difficulty with all aspects of her life. Unable to work, difficulty with ADLs, dressing, showering, washing the clothes, caring for her children, household and community ambulation, IADLs, difficulty with decision making and executive function tasks (03/05/2020); continues to struggle with similar problems (04/16/2020);    Time 12    Period Weeks    Status On-going                 Plan - 04/23/20 1100    Clinical Impression Statement Patient tolerated treatment well overall with some difficulty due to fatigue. No increase in pain. Continues to work towards improved strength and conditioning for daily tasks. Patient would benefit from continued management of limiting condition by skilled physical therapist to address remaining impairments and functional limitations to work towards stated goals and return to PLOF or maximal functional independence.    Personal Factors and Comorbidities Age;Comorbidity 3+;Time since onset of injury/illness/exacerbation;Past/Current Experience;Fitness    Comorbidities Relevant past medical history and comorbidities include hypertension, graves disease, anemia, asthma, leg pain (no dx, takes lyrica), hand pain (went to see if she had RA, but said she did not).    Examination-Activity Limitations Bathing;Hygiene/Grooming;Bed Mobility;Lift;Stairs;Locomotion Level;Stand;Caring for Others;Carry;Dressing    Examination-Participation Restrictions Laundry;Cleaning;Shop;Community Activity;Meal Prep;Driving;Occupation;Yard Work;Interpersonal Relationship   difficulty with all aspects  of her life. Unable to work, difficulty with ADLs, dressing, showering, washing the clothes, caring for her children, household and community ambulation, IADLs, difficulty with decision making and executive function tasks.   Stability/Clinical Decision Making Evolving/Moderate complexity    Rehab Potential Good    PT Frequency 2x / week    PT Duration 12 weeks    PT Treatment/Interventions ADLs/Self Care Home  Management;Aquatic Therapy;Moist Heat;Cryotherapy;Therapeutic activities;Therapeutic exercise;Neuromuscular re-education;Cognitive remediation;Patient/family education;Manual techniques;Dry needling;Passive range of motion;Energy conservation;Spinal Manipulations;Joint Manipulations    PT Next Visit Plan pacing, low rep, high resistance strength training as tolerated, low duration endurance intervals    PT Home Exercise Plan Medbridge Access Code: [email or web address]    Consulted and Agree with Plan of Care Patient           Patient will benefit from skilled therapeutic intervention in order to improve the following deficits and impairments:  Decreased cognition,Impaired sensation,Pain,Cardiopulmonary status limiting activity,Decreased mobility,Impaired perceived functional ability,Decreased strength,Decreased range of motion,Decreased endurance,Decreased activity tolerance,Difficulty walking,Obesity  Visit Diagnosis: Muscle weakness (generalized)  Other symptoms and signs involving the musculoskeletal system  Difficulty in walking, not elsewhere classified  Cervicalgia     Problem List Patient Active Problem List   Diagnosis Date Noted  . Vitamin D deficiency 04/22/2020  . 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 History of COVID-19 02/28/2020  . 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 Memory loss 02/28/2020  . Physical deconditioning 02/28/2020  . Dyspnea on exertion 02/28/2020  . Neck pain, bilateral 02/28/2020  . Muscle spasms of neck 02/28/2020  . Gallstones  01/30/2020  . COVID-19 01/23/2020  . Arthralgia of hand 01/24/2019  . CRP elevated 01/24/2019  . ESR raised 01/24/2019  . Class 3 severe obesity due to excess calories without serious comorbidity in adult ([place]) 01/24/2019  . Iron deficiency anemia   . Palpitations 09/16/2018  . Coronary artery calcification 09/16/2018  . 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 Bilateral leg edema 04/20/2017  . Incidental lung nodule, > 67m and < 835m11/28/2018  . Hives 09/02/2015  . Morbid obesity ([facility]/13/2017  . Anxiety 07/09/2015  . 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 Thyroid nodule 02/01/2015  . Vitamin D deficiency disease   . Vitamin B12 deficiency   . History of abnormal cervical Pap smear   . Allergy-induced asthma, mild intermittent, uncomplicated 0695/01/3266  SaEverlean AlstromSnGraylon GoodPT, DPT 04/23/20, 11:02 AM  [facility] [id number]: [phone] Fax:  [phone]: TaLELANI GARNETTRN: [id number] of Birth: 9/Nov 02, 1980

## 2020-04-25 ENCOUNTER — Other Ambulatory Visit: Payer: Self-pay

## 2020-04-25 ENCOUNTER — Encounter: Payer: Self-pay | Admitting: Physical Therapy

## 2020-04-25 ENCOUNTER — Ambulatory Visit: Payer: Managed Care, Other (non HMO) | Admitting: Physical Therapy

## 2020-04-25 DIAGNOSIS — M6281 Muscle weakness (generalized): Secondary | ICD-10-CM | POA: Diagnosis not present

## 2020-04-25 DIAGNOSIS — R29898 Other symptoms and signs involving the musculoskeletal system: Secondary | ICD-10-CM

## 2020-04-25 DIAGNOSIS — R262 Difficulty in walking, not elsewhere classified: Secondary | ICD-10-CM

## 2020-04-25 DIAGNOSIS — M542 Cervicalgia: Secondary | ICD-10-CM

## 2020-04-25 NOTE — Therapy (Signed)
[facility] [address] [place], [address] Phone: [phone]   Fax:  [phone]  Physical Therapy Treatment  Patient Details  Name: Catherine Berg MRN: [id number] Date of Birth: 1981-01-14 Referring Provider (PT): Kriste Basque. Nils Pyle, NP   Encounter Date: 04/25/2020   PT End of Session - 04/25/20 1044    Visit Number 13    Number of Visits 24    Date for PT Re-Evaluation 05/28/20    Authorization Type [employer] MANAGED reporting period from 04/18/2020    Progress Note Due on Visit 20    PT Start Time 1035    PT Stop Time 1115    PT Time Calculation (min) 40 min    Activity Tolerance Patient limited by fatigue    Behavior During Therapy [facility] for tasks assessed/performed           Past Medical History:  Diagnosis Date  . Abnormal thyroid blood test   . Anemia   . Anxiety   . Asthma   . Complication of anesthesia    itching after c sections  . 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 Graves disease   . History of abnormal cervical Pap smear   . History of cervical polypectomy   . Hives 09/02/2015  . Hypertension   . IFG (impaired fasting glucose)   . Incidental lung nodule, > 76m and < 839m11/28/2018   4 mm RLL lung nodule on chest CT Mar 31, 2017  . Low serum vitamin D   . Obesity   . Pregnancy induced hypertension    with 1st pregnancy, normal pressure with 2nd  . Reflux   . Thyroid disease   . Vitamin B12 deficiency   . Vitamin D deficiency disease   . Wears dentures    full upper    Past Surgical History:  Procedure Laterality Date  . CERVICAL POLYPECTOMY    . CESAREAN SECTION     x 2  . COLONOSCOPY WITH PROPOFOL N/A 11/14/2018   Procedure: COLONOSCOPY WITH PROPOFOL;  Surgeon: TaVirgel ManifoldMD;  [facility];  Laterality: N/A;  . ESOPHAGOGASTRODUODENOSCOPY (EGD) WITH PROPOFOL N/A 11/14/2018   Procedure:  ESOPHAGOGASTRODUODENOSCOPY (EGD) WITH PROPOFOL;  Surgeon: TaVirgel ManifoldMD;  [place] [facility] [place]: [place];  Laterality: N/A;  Latex allergy  . GIVENS CAPSULE STUDY N/A 12/27/2018   Procedure: GIVENS CAPSULE STUDY;  Surgeon: TaVirgel ManifoldMD;  Location: [facility] ENDOSCOPY;  Service: Endoscopy;  Laterality: N/A;    There were no vitals filed for this visit.   Subjective Assessment - 04/25/20 1036    Subjective Patient reports she is doing okay today. States she fell on Tuesday when her dog got under her foot. She says she has a bit of increased pain from it at the right glute region and wrist but she does not feel injured. Rates her pain number there as 4/10. Rates her fatigue level 3/10.    Pertinent History Patient is a [age].o. female who presents to outpatient physical therapy with a referral for medical diagnosis history of COVID-19, physical deconditioning. This patient's chief complaints consist of decreased activity tolerance, fatigue, poor endurance, and difficulty with cognition leading to the following functional deficits: difficulty with all aspects of her life. Unable to work, difficulty with ADLs, dressing, showering, washing the clothes, caring for her children, household and community ambulation, IADLs, difficulty with decision making and  tasks requring executive function. Relevant past medical history and comorbidities include hypertension, graves disease, anemia, asthma, leg pain (no dx, takes lyrica), hand pain (went to see if she had RA, but said she did not).  Patient denies hx of cancer, stroke, seizures, major cardiac events, diabetes, unexplained weight loss, changes in bowel or bladder problems, new onset stumbling or dropping things.    How long can you walk comfortably? 15 minutes    Patient Stated Goals "get back to normal"    Currently in Pain? Yes    Pain Score 4            TREATMENT:  Therapeutic exercise:to centralize symptoms and  improve ROM, strength, muscular endurance, and activity tolerance required for successful completion of functional activities. -ambulation around clinic for distance in 6 minutes. (1230 feet)  - seated chest press, 3x10, at 20# (self selected rest) - standing dead lift with 30# KB, 3x10 (self selected rest).  - seated lat pull, 3x10, at 25# (self-selected rest) - runner's step up to 8 inch step with unilateral UE hold, 3x10 each side. (self selected rest) - seated bicep curls, 3x10 with 10#DB - squat to chair tap at 18 inch chair (green), 3x10  Pt required multimodal cuing for proper technique and to facilitate improved neuromuscular control, strength, range of motion, and functional ability resulting in improved performance and form.   HOME EXERCISE PROGRAM Access Code: [email or web address] URL: [email or web address] Date: 04/11/2020 Prepared by: [name]  Program Notes Try to complete daily tasks in 2 minute intervals with 1-2 min seated rest every 2 minutes. This will be annoying and a challenge.  Try to track how your energy feels overall throughout the day if you complete your tasks this way versus just doing each task non-stop until it is complete.  Get a chair to sit on in the shower to decrease energy cost while bathing.   Exercises Walking Seated Correct Posture Prone Press Up - 4 x daily - 10-15 reps - 1 second hold Standing Lumbar Extension - 4 x daily - 10-15 reps - 1 sets - 1 second hold    PT Education - 04/25/20 1043    Education Details Exercise purpose/form. Self-management techniques    Person(s) Educated Patient    Methods Explanation;Demonstration;Verbal cues    Comprehension Verbalized understanding;Returned demonstration;Need further instruction;Verbal cues required            PT Short Term Goals - 04/16/20 1247      PT SHORT TERM GOAL #1   Title Be independent with initial home exercise program for self-management of symptoms.     Baseline introduced pacing (03/05/2020);    Time 3    Period Weeks    Status Achieved    Target Date 03/26/20             PT Long Term Goals - 04/16/20 1102      PT LONG TERM GOAL #1   Title Be independent with a long-term home exercise program for self-management of symptoms.    Baseline introduced pacing (03/05/2020); participating in HEP as able (04/16/2020);    Time 12    Period Weeks    Status Partially Met   TARGET DATE FOR ALL LONG TERM GOALS: 05/28/2020     PT LONG TERM GOAL #2   Title Demonstrate improved FOTO score to 65 by visit #10 demonstrate improvement in overall condition and self-reported functional ability.    Baseline 51 (03/05/2020); 48 (04/16/2020);    Time 12  Period Weeks    Status On-going      PT LONG TERM GOAL #3   Title Patient will improve 6 minute walk test to equal or greater than 1400 feet with no assistive device to demonstrate improved community ambulation for improved abliity to return to work (03/05/2020);    Baseline 800 feet and unable to walk longer than 5 minutes (03/05/2020); 1250 feet (04/16/2020);    Time 12    Period Weeks    Status Partially Met      PT LONG TERM GOAL #4   Title Catherine Berg will complete 5 Time Sit To Stand Test in equal or less than 12 seconds from 18.5 inch surface with no UE support to demonstrate improved LE muscle performance to improve ability to perform household chores and work activities    Baseline 17 seconds from 18.5 inch plinth, no UE support (03/05/2020); 12 seconds from 18.5 inch plinth with no UE support (04/16/2020);    Time 12    Period Weeks    Status Achieved      PT LONG TERM GOAL #5   Title Complete community, work and/or recreational activities without limitation due to current condition    Baseline difficulty with all aspects of her life. Unable to work, difficulty with ADLs, dressing, showering, washing the clothes, caring for her children, household and community ambulation, IADLs, difficulty with  decision making and executive function tasks (03/05/2020); continues to struggle with similar problems (04/16/2020);    Time 12    Period Weeks    Status On-going                 Plan - 04/25/20 1103    Clinical Impression Statement Patient tolerated treatment well overall but continues to be limited by fatigue. Seems to be doing well with gradually increasing exercise volume without worsening of fatigue more than she experiences with less volume. Patient would benefit from continued management of limiting condition by skilled physical therapist to address remaining impairments and functional limitations to work towards stated goals and return to PLOF or maximal functional independence.    Personal Factors and Comorbidities Age;Comorbidity 3+;Time since onset of injury/illness/exacerbation;Past/Current Experience;Fitness    Comorbidities Relevant past medical history and comorbidities include hypertension, graves disease, anemia, asthma, leg pain (no dx, takes lyrica), hand pain (went to see if she had RA, but said she did not).    Examination-Activity Limitations Bathing;Hygiene/Grooming;Bed Mobility;Lift;Stairs;Locomotion Level;Stand;Caring for Others;Carry;Dressing    Examination-Participation Restrictions Laundry;Cleaning;Shop;Community Activity;Meal Prep;Driving;Occupation;Yard Work;Interpersonal Relationship   difficulty with all aspects of her life. Unable to work, difficulty with ADLs, dressing, showering, washing the clothes, caring for her children, household and community ambulation, IADLs, difficulty with decision making and executive function tasks.   Stability/Clinical Decision Making Evolving/Moderate complexity    Rehab Potential Good    PT Frequency 2x / week    PT Duration 12 weeks    PT Treatment/Interventions ADLs/Self Care Home Management;Aquatic Therapy;Moist Heat;Cryotherapy;Therapeutic activities;Therapeutic exercise;Neuromuscular re-education;Cognitive  remediation;Patient/family education;Manual techniques;Dry needling;Passive range of motion;Energy conservation;Spinal Manipulations;Joint Manipulations    PT Next Visit Plan pacing, low rep, high resistance strength training as tolerated, low duration endurance intervals    PT Home Exercise Plan Medbridge Access Code: [email or web address]    Consulted and Agree with Plan of Care Patient           Patient will benefit from skilled therapeutic intervention in order to improve the following deficits and impairments:  Decreased cognition,Impaired sensation,Pain,Cardiopulmonary status limiting activity,Decreased mobility,Impaired perceived functional ability,Decreased strength,Decreased range of  motion,Decreased endurance,Decreased activity tolerance,Difficulty walking,Obesity  Visit Diagnosis: Muscle weakness (generalized)  Other symptoms and signs involving the musculoskeletal system  Difficulty in walking, not elsewhere classified  Cervicalgia     Problem List Patient Active Problem List   Diagnosis Date Noted  . Vitamin D deficiency 04/22/2020  . 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 History of COVID-19 02/28/2020  . 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 Memory loss 02/28/2020  . Physical deconditioning 02/28/2020  . Dyspnea on exertion 02/28/2020  . Neck pain, bilateral 02/28/2020  . Muscle spasms of neck 02/28/2020  . Gallstones 01/30/2020  . COVID-19 01/23/2020  . Arthralgia of hand 01/24/2019  . CRP elevated 01/24/2019  . ESR raised 01/24/2019  . Class 3 severe obesity due to excess calories without serious comorbidity in adult ([place]) 01/24/2019  . Iron deficiency anemia   . Palpitations 09/16/2018  . Coronary artery calcification 09/16/2018  . 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 Bilateral leg edema 04/20/2017  . Incidental lung nodule, > 14m and < 880m11/28/2018  . Hives 09/02/2015  . Morbid obesity ([facility]04/13/2017  . Anxiety 07/09/2015  . Breast  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 06/28/2015  . Thyroid nodule 02/01/2015  . Vitamin D deficiency disease   . Vitamin B12 deficiency   . History of abnormal cervical Pap smear   . Allergy-induced asthma, mild intermittent, uncomplicated 0682/41/7530  SaEverlean AlstromSnGraylon GoodPT, DPT 04/25/20, 11:08 AM  [facility] [id number]: [phone] Fax:  [phone]: TaORIAH LEINWEBERRN: [id number] of Birth: 9/20-Sep-1980

## 2020-04-29 NOTE — Progress Notes (Deleted)
Name: Catherine Berg   MRN: [id number]    DOB: 01/05/1981   Date:04/29/2020       Progress Note  Subjective  Chief Complaint  Follow up  HPI  History of COVID-19: she had mild cough and rhinorrhea and got tested on 01/10/2020, the day after she developed sob, body aches, lack of taste and smell. She is still feeling fatigued and has intermittent headache. She has no stamina .   Morbid obesity: BMI over 40, recovering from COVID-19 , not eating much but also not active right now   RLQ: she woke up around 3 am with pain on RLQ , she denies nausea, vomiting or change in bowel movement. She did not take any medication. Last meal was around 8 pm last night, she did not had breakfast because she was not hungry this morning. No fever or chills. She still has her appendix. Denies dysuria or urinary frequency. She denies personal history of kidney stones, no vaginal discharge   MDD recurrent :  Symptoms were worse around the time her mother died in 13-Jul-2014 , but had multiple episodes before that time that required her to take medication. She is having more anxiety, life has stressful and worse since COVID-19, states today her 30 yo daughter was sent home due to school exposure to COVID-19   Insomnia: we started her on trazodone back in June 2021, she states it helps her stay asleep and would like a refill.   Large breast: she states indentation under bra straps, makes her shoulders ache and is thinking again about breast reduction surgery . She wears 4 D, she has been very dissatisfied with her breast, tired of having large breasts, she found a Psychiatric nurse, but will hold off for now since not feeling well since COVID-19 infection this month. She saw  Dr. Silverio Lay in [place]   Elevated CRP- SED rate seen by Rheumatologist in the past and was given reassurance, but we will recheck since she has been sick due to [place] and not recovering well.   Patient Active Problem List   Diagnosis Date Noted  .  Vitamin D deficiency 04/22/2020  . 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 History of COVID-19 02/28/2020  . 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 Memory loss 02/28/2020  . Physical deconditioning 02/28/2020  . Dyspnea on exertion 02/28/2020  . Neck pain, bilateral 02/28/2020  . Muscle spasms of neck 02/28/2020  . Gallstones 01/30/2020  . COVID-19 01/23/2020  . Arthralgia of hand 01/24/2019  . CRP elevated 01/24/2019  . ESR raised 01/24/2019  . Class 3 severe obesity due to excess calories without serious comorbidity in adult ([employer]) 01/24/2019  . Iron deficiency anemia   . Palpitations 09/16/2018  . Coronary artery calcification 09/16/2018  . 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 Bilateral leg edema 04/20/2017  . Incidental lung nodule, > 1m and < 860m11/28/2018  . Hives 09/02/2015  . Morbid obesity ([facility]04/13/2017  . Anxiety 07/09/2015  . 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 Thyroid nodule 02/01/2015  . Vitamin D deficiency disease   . Vitamin B12 deficiency   . History of abnormal cervical Pap smear   . Allergy-induced asthma, mild intermittent, uncomplicated 0624/01/7352  Past Surgical History:  Procedure Laterality Date  . CERVICAL POLYPECTOMY    . CESAREAN SECTION     x 2  . COLONOSCOPY WITH PROPOFOL N/A 11/14/2018   Procedure: COLONOSCOPY WITH PROPOFOL;  Surgeon: TaVirgel ManifoldMD;  [facility];  Service: Endoscopy;  Laterality: N/A;  . ESOPHAGOGASTRODUODENOSCOPY (EGD) WITH PROPOFOL N/A 11/14/2018   Procedure: ESOPHAGOGASTRODUODENOSCOPY (EGD) WITH PROPOFOL;  Surgeon: Virgel Manifold, MD;  Location: [place];  Service: Endoscopy;  Laterality: N/A;  Latex allergy  . GIVENS CAPSULE STUDY N/A 12/27/2018   Procedure: GIVENS CAPSULE STUDY;  Surgeon: Virgel Manifold, MD;  Location: [facility] ENDOSCOPY;  Service: Endoscopy;  Laterality: N/A;    Family History  Problem Relation Age of Onset  . Heart  attack Mother 97  . Hypertension Mother   . Asthma Mother   . Cancer Mother        laryngeal  . Diabetes Mother        pre diabetic  . Heart disease Mother   . Mental illness Mother   . Alcohol abuse Mother   . Drug abuse Mother   . Depression Mother   . Anxiety disorder Mother   . Bipolar disorder Mother   . Learning disabilities Brother   . ADD / ADHD Brother   . Asthma Son   . Hyperlipidemia Brother   . Alcohol abuse Father   . Heart disease Maternal Grandmother        heart attack, pacemaker  . Heart attack Maternal Grandmother   . Hypertension Maternal Grandmother   . Hypertension Maternal Grandfather   . Heart disease Paternal Grandmother        couple of major open heart surgeries, leaking valves  . Hypertension Paternal Grandmother   . Hypertension Paternal Grandfather   . Breast cancer Neg Hx     Social History   Tobacco Use  . Smoking status: Never Smoker  . Smokeless tobacco: Never Used  Substance Use Topics  . Alcohol use: No    Alcohol/week: 0.0 standard drinks     Current Outpatient Medications:  .  albuterol (PROVENTIL HFA;VENTOLIN HFA) 108 (90 Base) MCG/ACT inhaler, Inhale 2 puffs into the lungs every 6 (six) hours as needed for wheezing or shortness of breath., Disp: 18 g, Rfl: 1 .  amphetamine-dextroamphetamine (ADDERALL XR) 10 MG 24 hr capsule, Take 1 capsule (10 mg total) by mouth daily., Disp: 30 capsule, Rfl: 0 .  clobetasol (TEMOVATE) 0.05 % external solution, Apply 1 application topically 2 (two) times daily., Disp: 50 mL, Rfl: 0 .  Cyanocobalamin (B-12) 1000 MCG SUBL, Place 1 tablet under the tongue daily., Disp: 30 each, Rfl: 5 .  DULoxetine (CYMBALTA) 30 MG capsule, Take 1 capsule (30 mg total) by mouth daily. First week take one after that two daily, Disp: 60 capsule, Rfl: 0 .  Elastic Bandages & Supports ([place]) MISC, Wear from morning to night, but do not sleep in these, Disp: 1 each, Rfl: 2 .  Etonogestrel  (NEXPLANON [place]), Inject into the skin., Disp: , Rfl:  .  famotidine (PEPCID) 20 MG tablet, Take 1 tablet (20 mg total) by mouth daily., Disp: 30 tablet, Rfl: 1 .  fluticasone (FLONASE) 50 MCG/ACT nasal spray, Place 2 sprays into both nostrils daily., Disp: 16 g, Rfl: 2 .  hydrOXYzine (ATARAX/VISTARIL) 25 MG tablet, Take 1 tablet (25 mg total) by mouth 3 (three) times daily as needed., Disp: 30 tablet, Rfl: 1 .  ibuprofen (ADVIL) 800 MG tablet, Take 1 tablet (800 mg total) by mouth every 8 (eight) hours as needed., Disp: 60 tablet, Rfl: 0 .  levothyroxine (SYNTHROID) 50 MCG tablet, Take 75 mcg by mouth daily., Disp: , Rfl:  .  montelukast (SINGULAIR) 10 MG tablet, Take  1 tablet (10 mg total) by mouth at bedtime., Disp: 90 tablet, Rfl: 1 .  pregabalin (LYRICA) 75 MG capsule, Take 1 capsule (75 mg total) by mouth 2 (two) times daily., Disp: 180 capsule, Rfl: 1 .  tiZANidine (ZANAFLEX) 4 MG tablet, Take 0.5-1.5 tablets (2-6 mg total) by mouth every 8 (eight) hours as needed for muscle spasms (muscle tightness)., Disp: 90 tablet, Rfl: 2 .  traZODone (DESYREL) 50 MG tablet, Take 0.5-1 tablets (25-50 mg total) by mouth at bedtime as needed for sleep., Disp: 60 tablet, Rfl: 0 .  Vitamin D, Ergocalciferol, (DRISDOL) 1.25 MG (50000 UT) CAPS capsule, Take 1 capsule (50,000 Units total) by mouth every 7 (seven) days., Disp: 12 capsule, Rfl: 1  Allergies  Allergen Reactions  . Latex Hives    (Balloons, condoms, underwear elastic, gloves)  . Shellfish Allergy Hives    I personally reviewed {Reviewed:14835} with the patient/caregiver today.   ROS  ***  Objective  There were no vitals filed for this visit.  There is no height or weight on file to calculate BMI.  Physical Exam ***  Recent Results (from the past 2160 hour(s))  CBC with Differential/Platelet     Status: None   Collection Time: 01/30/20 12:38 PM  Result Value Ref Range   WBC 6.5 3.8 - 10.8 Thousand/uL   RBC 4.42 3.80 - 5.10  Million/uL   Hemoglobin 12.7 11.7 - 15.5 g/dL   HCT 38.5 35.0 - 45.0 %   MCV 87.1 80.0 - 100.0 fL   MCH 28.7 27.0 - 33.0 pg   MCHC 33.0 32.0 - 36.0 g/dL   RDW 13.0 11.0 - 15.0 %   Platelets 340 140 - 400 Thousand/uL   MPV 9.9 7.5 - 12.5 fL   Neutro Abs 3,250 1,500 - 7,800 cells/uL   Lymphs Abs 2,678 850 - 3,900 cells/uL   Absolute Monocytes 449 200 - 950 cells/uL   Eosinophils Absolute 91 15 - 500 cells/uL   Basophils Absolute 33 0 - 200 cells/uL   Neutrophils Relative % 50 %   Total Lymphocyte 41.2 %   Monocytes Relative 6.9 %   Eosinophils Relative 1.4 %   Basophils Relative 0.5 %  COMPLETE METABOLIC PANEL WITH GFR     Status: None   Collection Time: 01/30/20 12:38 PM  Result Value Ref Range   Glucose, Bld 74 65 - 99 mg/dL    Comment: .            Fasting reference interval .    BUN 12 7 - 25 mg/dL   Creat 0.79 0.50 - 1.10 mg/dL   GFR, Est Non African American 94 > OR = 60 mL/min/1.55m   GFR, Est African American 109 > OR = 60 mL/min/1.785m  BUN/Creatinine Ratio NOT APPLICABLE 6 - 22 (calc)   Sodium 137 135 - 146 mmol/L   Potassium 4.5 3.5 - 5.3 mmol/L   Chloride 103 98 - 110 mmol/L   CO2 24 20 - 32 mmol/L   Calcium 9.2 8.6 - 10.2 mg/dL   Total Protein 7.1 6.1 - 8.1 g/dL   Albumin 3.9 3.6 - 5.1 g/dL   Globulin 3.2 1.9 - 3.7 g/dL (calc)   AG Ratio 1.2 1.0 - 2.5 (calc)   Total Bilirubin 0.5 0.2 - 1.2 mg/dL   Alkaline phosphatase (APISO) 58 31 - 125 U/L   AST 16 10 - 30 U/L   ALT 20 6 - 29 U/L  Sedimentation rate     Status: Abnormal  Collection Time: 01/30/20 12:38 PM  Result Value Ref Range   Sed Rate 29 (H) 0 - 20 mm/h  C-reactive protein     Status: Abnormal   Collection Time: 01/30/20 12:38 PM  Result Value Ref Range   CRP 16.0 (H) <8.0 mg/L  Urine Culture     Status: None   Collection Time: 01/30/20  2:18 PM   Specimen: Urine  Result Value Ref Range   MICRO NUMBER: [id number]    SPECIMEN QUALITY: Adequate    Sample Source URINE    STATUS: FINAL     ISOLATE 1:      Less than 10,000 CFU/mL of single Gram positive organism isolated. No further testing will be performed. If clinically indicated, recollection using a method to minimize contamination, with prompt transfer to Urine Culture Transport Tube, is recommended.    Diabetic Foot Exam: Diabetic Foot Exam - Simple   No data filed    ***  PHQ2/9: Depression screen [employer] 2/9 03/06/2020 01/30/2020 01/23/2020 01/10/2020 10/31/2019  Decreased Interest 2 1 0 0 1  Down, Depressed, Hopeless 2 1 0 0 1  PHQ - 2 Score 4 2 0 0 2  Altered sleeping 2 - - 0 1  Tired, decreased energy 3 - - 0 1  Change in appetite 1 - - 0 1  Feeling bad or failure about yourself  1 - - 0 1  Trouble concentrating 0 - - 0 0  Moving slowly or fidgety/restless 0 - - 0 0  Suicidal thoughts 0 - - 0 0  PHQ-9 Score 11 - - 0 6  Difficult doing work/chores Somewhat difficult - - Not difficult at all Somewhat difficult  Some recent data might be hidden    phq 9 is {gen pos SCB:[id number]} ***  Fall Risk: Fall Risk  03/06/2020 02/28/2020 01/30/2020 01/23/2020 01/10/2020  Falls in the past year? 0 0 0 0 0  Number falls in past yr: 0 0 0 0 0  Injury with Fall? 0 0 0 0 0  Comment - - - - -  Follow up - - - Falls evaluation completed -   ***   Functional Status Survey:   ***   Assessment & Plan  *** There are no diagnoses linked to this encounter.

## 2020-04-30 ENCOUNTER — Ambulatory Visit: Payer: Managed Care, Other (non HMO) | Admitting: Physical Therapy

## 2020-04-30 ENCOUNTER — Ambulatory Visit: Payer: Self-pay | Admitting: *Deleted

## 2020-04-30 ENCOUNTER — Encounter: Payer: Self-pay | Admitting: Family Medicine

## 2020-04-30 NOTE — Telephone Encounter (Signed)
Pt called in concerned about having shortness of breath and feeling lightheaded yesterday while in [employer].   "I have the long haul symptoms of covid".    "I go to the long haul covid clinic".  I've been short of breath since having covid but yesterday it seemed worse.  "I'm breathing fine right now".   The shortness of breath is intermittent but yesterday it was worse.  See notes below.  I am sending my notes to Dr. Carlynn Purl at [facility].   covid questionnaire completed and indicates a video visit which she is fine with doing.   I let her know someone would call her back regarding an appt since there is no availability until January.    She is not in distress now "just concerned and wanted to reach out to Dr. Carlynn Purl and let her know what is going on".  She is agreeable to this plan.   I did instruct her to go to the ED if her breathing became worse or she felt dizzy or lightheaded again.  She verbalized understanding.  The number [phone] is correct number.    Reason for Disposition . [1] MODERATE longstanding difficulty breathing (e.g., speaks in phrases, SOB even at rest, pulse 100-120) AND [2] SAME as normal  Answer Assessment - Initial Assessment Questions 1. RESPIRATORY STATUS: "Describe your breathing?" (e.g., wheezing, shortness of breath, unable to speak, severe coughing)      Started yesterday evening shortness of breath.   I went to [facility] and I really noticed the shortness of breath then I felt like I might pass out for a minute or two.   I sit down.   I  Drove to my  Son's house.   My pulse is 86-106.  I have a pulse oximeter it's 98-100%  Pulse is 106 at the highest.  I'm sitting and it's 106.    I had covid is reason I bought the pulse oximeter.   I had covid in Sept.   I've been out of work since Sept.   I go to the long haul covid clinic.  I'm to see a PT and neurologist for the fogginess due to covid.  I also thyroid underactive.   My medication for thyroid  was just increased recently. 2. ONSET: "When did this breathing problem begin?"      See above  Yesterday.   3. PATTERN "Does the difficult breathing come and go, or has it been constant since it started?"      Comes and goes the shortness of breath.   The shortness of breath has been going on since I was sick.   I'm concerned about my pulse being elevated.  Denies chest pain. 4. SEVERITY: "How bad is your breathing?" (e.g., mild, moderate, severe)    - MILD: No SOB at rest, mild SOB with walking, speaks normally in sentences, can lay down, no retractions, pulse < 100.    - MODERATE: SOB at rest, SOB with minimal exertion and prefers to sit, cannot lie down flat, speaks in phrases, mild retractions, audible wheezing, pulse 100-120.    - SEVERE: Very SOB at rest, speaks in single words, struggling to breathe, sitting hunched forward, retractions, pulse > 120      Mild intermittent.   Not short of breath right now. 5. RECURRENT SYMPTOM: "Have you had difficulty breathing before?" If Yes, ask: "When was the last time?" and "What happened that time?"      Yes with covid in  Sept. 6. CARDIAC HISTORY: "Do you have any history of heart disease?" (e.g., heart attack, angina, bypass surgery, angioplasty)      No 7. LUNG HISTORY: "Do you have any history of lung disease?"  (e.g., pulmonary embolus, asthma, emphysema)     I have asthma.  No wheezing. 8. CAUSE: "What do you think is causing the breathing problem?"      I don't know.  I feel off.   9. OTHER SYMPTOMS: "Do you have any other symptoms? (e.g., dizziness, runny nose, cough, chest pain, fever)     Not having dizziness.  No fever. 10. PREGNANCY: "Is there any chance you are pregnant?" "When was your last menstrual period?"       No 11. TRAVEL: "Have you traveled out of the country in the last month?" (e.g., travel history, exposures)       No  Protocols used: BREATHING DIFFICULTY-A-AH

## 2020-05-02 ENCOUNTER — Ambulatory Visit: Payer: Managed Care, Other (non HMO) | Admitting: Physical Therapy

## 2020-05-06 ENCOUNTER — Encounter: Payer: Self-pay | Admitting: Family Medicine

## 2020-05-06 ENCOUNTER — Ambulatory Visit: Payer: Managed Care, Other (non HMO) | Admitting: Family Medicine

## 2020-05-06 ENCOUNTER — Telehealth (INDEPENDENT_AMBULATORY_CARE_PROVIDER_SITE_OTHER): Payer: Managed Care, Other (non HMO) | Admitting: Family Medicine

## 2020-05-06 DIAGNOSIS — F5104 Psychophysiologic insomnia: Secondary | ICD-10-CM

## 2020-05-06 DIAGNOSIS — F331 Major depressive disorder, recurrent, moderate: Secondary | ICD-10-CM

## 2020-05-06 DIAGNOSIS — F419 Anxiety disorder, unspecified: Secondary | ICD-10-CM

## 2020-05-06 DIAGNOSIS — U099 Post covid-19 condition, unspecified: Secondary | ICD-10-CM

## 2020-05-06 DIAGNOSIS — L509 Urticaria, unspecified: Secondary | ICD-10-CM

## 2020-05-06 MED ORDER — AMPHETAMINE-DEXTROAMPHET ER 10 MG PO CP24: 10.0000 mg | ORAL_CAPSULE | Freq: Every day | ORAL | 0 refills | Status: DC

## 2020-05-06 MED ORDER — DULOXETINE HCL 60 MG PO CPEP: 60.0000 mg | ORAL_CAPSULE | Freq: Every day | ORAL | 0 refills | Status: DC

## 2020-05-06 MED ORDER — TRAZODONE HCL 50 MG PO TABS: 25.0000 mg | ORAL_TABLET | Freq: Every evening | ORAL | 0 refills | Status: DC | PRN

## 2020-05-06 MED ORDER — HYDROXYZINE HCL 25 MG PO TABS: 25.0000 mg | ORAL_TABLET | Freq: Three times a day (TID) | ORAL | 1 refills | Status: DC | PRN

## 2020-05-06 NOTE — Progress Notes (Signed)
Name: Catherine Berg   MRN: [id number]    DOB: 1980/12/09   Date:05/06/2020       Progress Note  Subjective  Chief Complaint  Chief Complaint  Patient presents with  . Follow-up    I connected with  [name]  on 05/06/20 at 10:00 AM EST by telephone  application and verified that I am speaking with the correct person using two identifiers.  I discussed the limitations of evaluation and management by telemedicine and the availability of in person appointments. The patient expressed understanding and agreed to proceed with the virtual visit  Staff also discussed with the patient that there may be a patient responsible charge related to this service. 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 She continues to have fatigue, worried about memory loss ( having difficulty even cooking), continues to have lack of taste and smell. She was seen by COVID-19 clinic end of October and was referred to PT and also neurologist - her visit with neurologist is not until end of January, she was also referral to Speech Therapist  but cannot go yet because her daughter tested positive for COVID-19 on 05/02/2020.  She noticed fatigue, dizziness and tachycardia  on 04/30/2020, her COVID-19 test was negative, but daughter positive, she is still fatigue, seems like the symptoms that were improving with PT is now regressing again  We will have to extend her FMLA since she is not back to baseline, still having memory problems, fatigue and now re-exposure to COVID-19 She did not have COVID-19 vaccines.  She had flu vaccine.   Depression: she states feeling slightly better with medication, only taking Duloxetine 30 mg, we will increase to 60 mg, she is taking hydroxyzine prn to help with anxiety and  trazodone 50 mg every night to help her fall and stay asleep. She states Adderal helps a little with energy but not since last week.  Phq9 reviewed, asked her to come in person for her next visit in one month   Patient Active Problem List   Diagnosis Date Noted  . Vitamin D deficiency 04/22/2020  . 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 History of COVID-19 02/28/2020  . 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 Memory loss 02/28/2020  . Physical deconditioning 02/28/2020  . Dyspnea on exertion 02/28/2020  . Neck pain, bilateral 02/28/2020  . Muscle spasms of neck 02/28/2020  . Gallstones 01/30/2020  . COVID-19 01/23/2020  . Arthralgia of hand 01/24/2019  . CRP elevated 01/24/2019  . ESR raised 01/24/2019  . Class 3 severe obesity due to excess calories without serious comorbidity in adult ([place]) 01/24/2019  . Iron deficiency anemia   . Palpitations 09/16/2018  . Coronary artery calcification 09/16/2018  . 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 Bilateral leg edema 04/20/2017  . Incidental lung nodule, > 30m and < 8104m11/28/2018  . Hives 09/02/2015  . Morbid obesity ([place]/13/2017  . Anxiety 07/09/2015  . 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 Thyroid nodule 02/01/2015  . Vitamin D deficiency disease   . Vitamin B12 deficiency   . History of abnormal cervical Pap smear   . Allergy-induced asthma, mild intermittent, uncomplicated [id number]/[id number]  Past Surgical History:  Procedure Laterality Date  . CERVICAL POLYPECTOMY    .  CESAREAN SECTION     x 2  . COLONOSCOPY WITH PROPOFOL N/A 11/14/2018   Procedure: COLONOSCOPY WITH PROPOFOL;  Surgeon: Virgel Manifold, MD;  Location: [place];  Service: Endoscopy;  Laterality: N/A;  . ESOPHAGOGASTRODUODENOSCOPY (EGD) WITH PROPOFOL N/A 11/14/2018   Procedure: ESOPHAGOGASTRODUODENOSCOPY (EGD) WITH PROPOFOL;  Surgeon: Virgel Manifold, MD;  Location: [place];  Service: Endoscopy;   Laterality: N/A;  Latex allergy  . GIVENS CAPSULE STUDY N/A 12/27/2018   Procedure: GIVENS CAPSULE STUDY;  Surgeon: Virgel Manifold, MD;  Location: [facility] END[facility]COPY;  Service: Endoscopy;  Laterality: N/A;    Family History  Problem Relation Age of Onset  . Heart attack Mother 59  . Hypertension Mother   . Asthma Mother   . Cancer Mother        laryngeal  . Diabetes Mother        pre diabetic  . Heart disease Mother   . Mental illness Mother   . Alcohol abuse Mother   . Drug abuse Mother   . Depression Mother   . Anxiety disorder Mother   . Bipolar disorder Mother   . Learning disabilities Brother   . ADD / ADHD Brother   . Asthma Son   . Hyperlipidemia Brother   . Alcohol abuse Father   . Heart disease Maternal Grandmother        heart attack, pacemaker  . Heart attack Maternal Grandmother   . Hypertension Maternal Grandmother   . Hypertension Maternal Grandfather   . Heart disease Paternal Grandmother        couple of major open heart surgeries, leaking valves  . Hypertension Paternal Grandmother   . Hypertension Paternal Grandfather   . Breast cancer Neg Hx     Social History   Socioeconomic History  . Marital status: Single    Spouse name: Not on file  . Number of children: 2  . Years of education: Not on file  . Highest education level: High school graduate  Occupational History  . Not on file  Tobacco Use  . Smoking status: Never Smoker  . Smokeless tobacco: Never Used  Vaping Use  . Vaping Use: Never used  Substance and Sexual Activity  . Alcohol use: No    Alcohol/week: 0.0 standard drinks  . Drug use: No  . Sexual activity: Yes    Partners: Male    Birth control/protection: None  Other Topics Concern  . Not on file  Social History Narrative  . Not on file   Social Determinants of Health   Financial Resource Strain: Not on file  Food Insecurity: Not on file  Transportation Needs: Not on file  Physical Activity: Not on file  Stress: Not  on file  Social Connections: Not on file  Intimate Partner Violence: Not on file     Current Outpatient Medications:  .  albuterol (PROVENTIL HFA;VENTOLIN HFA) 108 (90 Base) MCG/ACT inhaler, Inhale 2 puffs into the lungs every 6 (six) hours as needed for wheezing or shortness of breath., Disp: 18 g, Rfl: 1 .  amphetamine-dextroamphetamine (ADDERALL XR) 10 MG 24 hr capsule, Take 1 capsule (10 mg total) by mouth daily., Disp: 30 capsule, Rfl: 0 .  clobetasol (TEMOVATE) 0.05 % external solution, Apply 1 application topically 2 (two) times daily., Disp: 50 mL, Rfl: 0 .  Cyanocobalamin (B-12) 1000 MCG SUBL, Place 1 tablet under the tongue daily., Disp: 30 each, Rfl: 5 .  DULoxetine (CYMBALTA) 30 MG capsule, Take 1 capsule (30  mg total) by mouth daily. First week take one after that two daily, Disp: 60 capsule, Rfl: 0 .  Elastic Bandages & Supports ([place]) MISC, Wear from morning to night, but do not sleep in these, Disp: 1 each, Rfl: 2 .  Etonogestrel (NEXPLANON [place]), Inject into the skin., Disp: , Rfl:  .  famotidine (PEPCID) 20 MG tablet, Take 1 tablet (20 mg total) by mouth daily., Disp: 30 tablet, Rfl: 1 .  fluticasone (FLONASE) 50 MCG/ACT nasal spray, Place 2 sprays into both nostrils daily., Disp: 16 g, Rfl: 2 .  hydrOXYzine (ATARAX/VISTARIL) 25 MG tablet, Take 1 tablet (25 mg total) by mouth 3 (three) times daily as needed., Disp: 30 tablet, Rfl: 1 .  ibuprofen (ADVIL) 800 MG tablet, Take 1 tablet (800 mg total) by mouth every 8 (eight) hours as needed., Disp: 60 tablet, Rfl: 0 .  levothyroxine (SYNTHROID) 50 MCG tablet, Take 75 mcg by mouth daily., Disp: , Rfl:  .  montelukast (SINGULAIR) 10 MG tablet, Take 1 tablet (10 mg total) by mouth at bedtime., Disp: 90 tablet, Rfl: 1 .  pregabalin (LYRICA) 75 MG capsule, Take 1 capsule (75 mg total) by mouth 2 (two) times daily., Disp: 180 capsule, Rfl: 1 .  tiZANidine (ZANAFLEX) 4 MG tablet, Take 0.5-1.5 tablets (2-6 mg total)  by mouth every 8 (eight) hours as needed for muscle spasms (muscle tightness)., Disp: 90 tablet, Rfl: 2 .  traZODone (DESYREL) 50 MG tablet, Take 0.5-1 tablets (25-50 mg total) by mouth at bedtime as needed for sleep., Disp: 60 tablet, Rfl: 0 .  Vitamin D, Ergocalciferol, (DRISDOL) 1.25 MG (50000 UT) CAPS capsule, Take 1 capsule (50,000 Units total) by mouth every 7 (seven) days., Disp: 12 capsule, Rfl: 1  Allergies  Allergen Reactions  . Latex Hives    (Balloons, condoms, underwear elastic, gloves)  . Shellfish Allergy Hives    I personally reviewed active problem list, medication list, allergies, family history, social history, health maintenance with the patient/caregiver today.   ROS  Ten systems reviewed and is negative except as mentioned in HPI   Objective  Virtual encounter, vitals not obtained.  There is no height or weight on file to calculate BMI.  Physical Exam  Awake, alert and oriented  PHQ2/9: Depression screen [facility] 2/9 05/06/2020 03/06/2020 01/30/2020 01/23/2020 01/10/2020  Decreased Interest _0 0 0  Down, Depressed, Hopeless _1 0 0  PHQ - 2 Score _2 0 0  Altered sleeping 1 2 - - 0  Tired, decreased energy 3 3 - - 0  Change in appetite 2 1 - - 0  Feeling bad or failure about yourself  1 1 - - 0  Trouble concentrating 0 0 - - 0  Moving slowly or fidgety/restless 0 0 - - 0  Suicidal thoughts 0 0 - - 0  PHQ-9 Score 9 11 - - 0  Difficult doing work/chores - Somewhat difficult - - Not difficult at all  Some recent data might be hidden   PHQ-2/9 Result is positive.    Fall Risk: Fall Risk  05/06/2020 03/06/2020 02/28/2020 01/30/2020 01/23/2020  Falls in the past year? 1 0 0 0 0  Number falls in past yr: 0 0 0 0 0  Injury with Fall? 0 0 0 0 0  Comment - - - - -  Follow up - - - - Falls evaluation completed    Assessment & Plan  1. Moderate recurrent major depression ([place])  -  DULoxetine (CYMBALTA) 60 MG capsule; Take 1 capsule (60 mg total) by mouth  daily. First week take one after that two daily  Dispense: 90 capsule; Refill: 0 - amphetamine-dextroamphetamine (ADDERALL XR) 10 MG 24 hr capsule; Take 1 capsule (10 mg total) by mouth daily.  Dispense: 30 capsule; Refill: 0  2. Hives  - hydrOXYzine (ATARAX/VISTARIL) 25 MG tablet; Take 1 tablet (25 mg total) by mouth 3 (three) times daily as needed.  Dispense: 30 tablet; Refill: 1  3. COVID-19 long hauler  - amphetamine-dextroamphetamine (ADDERALL XR) 10 MG 24 hr capsule; Take 1 capsule (10 mg total) by mouth daily.  Dispense: 30 capsule; Refill: 0  4. Insomnia, psychophysiological  - traZODone (DESYREL) 50 MG tablet; Take 0.5-1 tablets (25-50 mg total) by mouth at bedtime as needed for sleep.  Dispense: 90 tablet; Refill: 0  I discussed the assessment and treatment plan with the patient. The patient was provided an opportunity to ask questions and all were answered. The patient agreed with the plan and demonstrated an understanding of the instructions.  The patient was advised to call back or seek an in-person evaluation if the symptoms worsen or if the condition fails to improve as anticipated.  I provided 25 minutes of non-face-to-face time during this encounter.

## 2020-05-07 ENCOUNTER — Encounter: Payer: Managed Care, Other (non HMO) | Admitting: Physical Therapy

## 2020-05-08 ENCOUNTER — Ambulatory Visit: Payer: Managed Care, Other (non HMO) | Admitting: Speech Pathology

## 2020-05-09 ENCOUNTER — Ambulatory Visit: Payer: Managed Care, Other (non HMO) | Admitting: Physical Therapy

## 2020-05-13 ENCOUNTER — Other Ambulatory Visit: Payer: Self-pay

## 2020-05-13 ENCOUNTER — Ambulatory Visit: Payer: Managed Care, Other (non HMO) | Attending: Nurse Practitioner | Admitting: Speech Pathology

## 2020-05-13 DIAGNOSIS — R29898 Other symptoms and signs involving the musculoskeletal system: Secondary | ICD-10-CM | POA: Diagnosis present

## 2020-05-13 DIAGNOSIS — R41840 Attention and concentration deficit: Secondary | ICD-10-CM | POA: Diagnosis present

## 2020-05-13 DIAGNOSIS — M542 Cervicalgia: Secondary | ICD-10-CM | POA: Diagnosis present

## 2020-05-13 DIAGNOSIS — R262 Difficulty in walking, not elsewhere classified: Secondary | ICD-10-CM | POA: Insufficient documentation

## 2020-05-13 DIAGNOSIS — U099 Post covid-19 condition, unspecified: Secondary | ICD-10-CM | POA: Insufficient documentation

## 2020-05-13 DIAGNOSIS — M6281 Muscle weakness (generalized): Secondary | ICD-10-CM | POA: Insufficient documentation

## 2020-05-13 DIAGNOSIS — R41841 Cognitive communication deficit: Secondary | ICD-10-CM | POA: Insufficient documentation

## 2020-05-13 NOTE — Therapy (Signed)
[place] MAIN [facility] SERVICES [address] [place], [place], [address] Phone: [phone]   Fax:  [phone]  Speech Language Pathology Evaluation  Patient Details  Name: Catherine Berg MRN: [id number] Date of Birth: 1980-06-05 Referring Provider (SLP): Lazaro Arms, NP   Encounter Date: 05/13/2020   End of Session - 05/13/20 1436    Visit Number 1    Number of Visits 17    Date for SLP Re-Evaluation 07/12/20    Authorization Type [employer]    Authorization - Visit Number 1    Progress Note Due on Visit 10    SLP Start Time 1102    SLP Stop Time  1155    SLP Time Calculation (min) 53 min    Activity Tolerance Patient tolerated treatment well           Past Medical History:  Diagnosis Date  . Abnormal thyroid blood test   . Anemia   . Anxiety   . Asthma   . Complication of anesthesia    itching after c sections  . 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 Graves disease   . History of abnormal cervical Pap smear   . History of cervical polypectomy   . Hives 09/02/2015  . Hypertension   . IFG (impaired fasting glucose)   . Incidental lung nodule, > 18m and < 869m11/28/2018   4 mm RLL lung nodule on chest CT Mar 31, 2017  . Low serum vitamin D   . Obesity   . Pregnancy induced hypertension    with 1st pregnancy, normal pressure with 2nd  . Reflux   . Thyroid disease   . Vitamin B12 deficiency   . Vitamin D deficiency disease   . Wears dentures    full upper    Past Surgical History:  Procedure Laterality Date  . CERVICAL POLYPECTOMY    . CESAREAN SECTION     x 2  . COLONOSCOPY WITH PROPOFOL N/A 11/14/2018   Procedure: COLONOSCOPY WITH PROPOFOL;  Surgeon: TaVirgel ManifoldMD;  [place];  Laterality: N/A;  . ESOPHAGOGASTRODUODENOSCOPY (EGD) WITH PROPOFOL N/A 11/14/2018   Procedure: ESOPHAGOGASTRODUODENOSCOPY (EGD) WITH PROPOFOL;  Surgeon: TaVirgel ManifoldMD;  [place] Service: [place];  Laterality: N/A;  Latex allergy  . GIVENS CAPSULE STUDY N/A 12/27/2018   Procedure: GIVENS CAPSULE STUDY;  Surgeon: TaVirgel ManifoldMD;  Location: [facility] ENDOSCOPY;  Service: Endoscopy;  Laterality: N/A;    There were no vitals filed for this visit.   Subjective Assessment - 05/13/20 1249    Subjective "Before this I was very vibrant and on top of things."    Currently in Pain? 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 HaAldean Bakers a [age].o. 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 Past medical history is noted for anxiety, asthma, Graves disease, HTN.    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[employer]; now on short-term disability     Cognition   Overall Cognitive Status Impaired/Different from baseline    Area of Impairment Attention;Memory   executive function, processing   Current Attention Level Selective    Memory Decreased short-term memory    Problem Solving Slow processing    Attention Alternating;Divided    Alternating Attention Impaired    Alternating Attention Impairment Functional complex   forgets to return to a task if  interrupted   Divided Attention Impaired    Divided Attention Impairment Functional complex   difficulty multitasking   Memory Impaired   visual 3/6, story retell 4/10   Memory Impairment Storage deficit;Decreased recall of new information   forgets whether she has taken meds sometimes, forgets where she is going,   Awareness Impaired    Awareness Impairment Anticipatory impairment    Problem Solving Impaired    Problem Solving Impairment Verbal complex;Functional complex    Architect Impairment Functional complex   has difficulty planning/scheduling her day, managing daily tasks   Self Monitoring --   a relative strength; attempted self-correction frequently   Self Correcting --   relative strength; corrected trailmaking, design generation error     Auditory Comprehension   Overall Auditory Comprehension Impaired    Yes/No Questions Within Functional Limits    Commands Within Functional Limits    Conversation Moderately complex    Other Conversation Comments has difficulty processing mod complex conversation (unable to restate goals identified after presented verbally)    Interfering Components Processing speed;Attention    EffectiveTechniques Other (Comment);Pausing;Repetition;Extra processing time   written support     Visual Recognition/Discrimination   Discrimination Not tested      Reading Comprehension   Reading Status Within funtional limits      Expression   Primary Mode of Expression Verbal      Verbal Expression   Overall Verbal Expression Appears within functional limits for tasks assessed      Written Expression   Dominant Hand Right    Written Expression Not tested      Oral Motor/Sensory Function   Overall Oral Motor/Sensory Function Appears within functional limits for tasks assessed      Motor Speech   Overall Motor Speech Appears within functional limits for tasks  assessed      Standardized Assessments   Standardized Assessments  Cognitive Linguistic Quick Test;Other Assessment    Other Assessment MMQ (Multifactorial Memory Questionaire) Memory Mistakes: Raw score 36, T score 39 = Below Average      Cognitive Linguistic Quick Test (Ages 18-69)   Attention WNL   188/215, functional deficits reported   Memory Moderate   115/185   Executive Function WNL   27/40, functional deficits noted. clock drawing 11/13 (mild)   Language Mild   27/37; judged to be WNL; memory task impacted this score   Visuospatial Skills WNL   84/105   Severity Rating Total 17    Composite Severity Rating 3.4 (mild)  SLP Education - 05/13/20 1436    Education Details use of planner    Person(s) Educated Patient    Methods Explanation    Comprehension Verbalized understanding            SLP Short Term Goals - 05/13/20 1453      SLP SHORT TERM GOAL #1   Title Patient will obtain a system for recall/executive function and bring to at least 3 therapy sessions.    Time 4    Period Weeks    Status New    Target Date 06/12/20      SLP SHORT TERM GOAL #2   Title Patient will report no forgotten items at the grocery store using compensations for recall.    Time 4    Period Weeks    Status New            SLP Long Term Goals - 05/13/20 1457      SLP LONG TERM GOAL #1   Title Patient will use external aids/strategies to run 2+ errands in a row.    Time 8    Period Weeks    Status New      SLP LONG TERM GOAL #2   Title Patient will report no missed medications or bills 4 visits in a row using memory compensations.    Time 8    Period Weeks    Status New      SLP LONG TERM GOAL #3   Title Patient will use compensations for attention/information processing to complete mod complex cognitive linguistic tasks >85% accuracy.    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 Deficits include short term memory, functionally impaired higher-level attention and executive function skills, and slow processing. Patient reports this is causing her great frustration and difficulty managing household tasks, running errands, paying bills on time, and remembering to take medications. Prior to her illness, she managed online grocery shopping for [employer]; currently on short-term disability but has a goal to return to work. I recommend skilled ST to train pt in compensations and strategies to improve her attention, recall and organization skills in order to improve function at work and home.    Speech Therapy Frequency 2x / week    Duration 8 weeks    Treatment/Interventions Environmental controls;Cueing hierarchy;SLP instruction and feedback;Cognitive reorganization;Compensatory techniques;Functional tasks;Compensatory strategies;Internal/external aids;Patient/family education    Potential to Achieve Goals Good    Consulted and Agree with Plan of Care Patient           Patient will benefit from skilled therapeutic intervention in order to improve the following deficits and impairments:   Cognitive communication deficit  COVID-19 long hauler manifesting chronic concentration deficit    Problem List Patient Active Problem List   Diagnosis Date Noted  . Vitamin D deficiency 04/22/2020  . 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 History of COVID-19 02/28/2020  . 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 Memory loss 02/28/2020  . Physical deconditioning 02/28/2020  . Dyspnea on exertion 02/28/2020  . Neck pain, bilateral 02/28/2020  . Muscle spasms of neck 02/28/2020  . Gallstones 01/30/2020  . COVID-19 01/23/2020  . Arthralgia of hand 01/24/2019  . CRP elevated 01/24/2019  . ESR raised 01/24/2019  . Class 3 severe  obesity due to excess  calories without serious comorbidity in adult [facility]) 01/24/2019  . Iron deficiency anemia   . Palpitations 09/16/2018  . Coronary artery calcification 09/16/2018  . 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 Bilateral leg edema 04/20/2017  . Incidental lung nodule, > 54m and < 827m11/28/2018  . Hives 09/02/2015  . Morbid obesity ([facility]/13/2017  . Anxiety 07/09/2015  . 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 Thyroid nodule 02/01/2015  . Vitamin D deficiency disease   . Vitamin B12 deficiency   . History of abnormal cervical Pap smear   . Allergy-induced asthma, mild intermittent, uncomplicated [id number] MaDeneise LeverMSSpotsylvania CourthouseCCC-SLP Speech-Language Pathologist  MaAliene Altes/02/2021, 2:59 PM  CoCarthageAIN RESelect [facility]St[facility] LouisERVICES [address] State[place]hone: [phone] Fax:  [phone]: TaALYONA ROMACKRN: [id number] of Birth: 9/June 27, 1980

## 2020-05-14 ENCOUNTER — Other Ambulatory Visit: Payer: Self-pay

## 2020-05-14 ENCOUNTER — Encounter: Payer: Self-pay | Admitting: Physical Therapy

## 2020-05-14 ENCOUNTER — Ambulatory Visit: Payer: Managed Care, Other (non HMO) | Admitting: Physical Therapy

## 2020-05-14 DIAGNOSIS — M6281 Muscle weakness (generalized): Secondary | ICD-10-CM

## 2020-05-14 DIAGNOSIS — R29898 Other symptoms and signs involving the musculoskeletal system: Secondary | ICD-10-CM

## 2020-05-14 DIAGNOSIS — M542 Cervicalgia: Secondary | ICD-10-CM

## 2020-05-14 DIAGNOSIS — R262 Difficulty in walking, not elsewhere classified: Secondary | ICD-10-CM

## 2020-05-14 DIAGNOSIS — R41841 Cognitive communication deficit: Secondary | ICD-10-CM | POA: Diagnosis not present

## 2020-05-14 NOTE — Therapy (Signed)
[place] [facility] PHYSICAL AND SPORTS MEDICINE [address], [place], [address] Phone: [phone]   Fax:  [phone]  Physical Therapy Treatment  Patient Details  Name: Catherine Berg MRN: [id number] Date of Birth: [birth year] Referring Provider (PT): Gildardo Pounds. Tanda Rockers, NP   Encounter Date: 05/14/2020   PT End of Session - 05/14/20 1046    Visit Number 14    Number of Visits 24    Date for PT Re-Evaluation 05/28/20    Authorization Type [employer] MANAGED reporting period from 04/18/2020    Progress Note Due on Visit 20    PT Start Time 1033    PT Stop Time 1111    PT Time Calculation (min) 38 min    Activity Tolerance Patient limited by fatigue    Behavior During Therapy [facility] for tasks assessed/performed           Past Medical History:  Diagnosis Date  . Abnormal thyroid blood test   . Anemia   . Anxiety   . Asthma   . Complication of anesthesia    itching after c sections  . 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 Graves disease   . History of abnormal cervical Pap smear   . History of cervical polypectomy   . Hives 09/02/2015  . Hypertension   . IFG (impaired fasting glucose)   . Incidental lung nodule, > 63mm and < 57mm 03/31/2017   4 mm RLL lung nodule on chest CT Mar 31, 2017  . Low serum vitamin D   . Obesity   . Pregnancy induced hypertension    with 1st pregnancy, normal pressure with 2nd  . Reflux   . Thyroid disease   . Vitamin B12 deficiency   . Vitamin D deficiency disease   . Wears dentures    full upper    Past Surgical History:  Procedure Laterality Date  . CERVICAL POLYPECTOMY    . CESAREAN SECTION     x 2  . COLONOSCOPY WITH PROPOFOL N/A 11/14/2018   Procedure: COLONOSCOPY WITH PROPOFOL;  Surgeon: Pasty Spillers, MD;  [facility];  Service: Endoscopy;  Laterality: N/A;  . ESOPHAGOGASTRODUODENOSCOPY (EGD) WITH PROPOFOL N/A 11/14/2018   Procedure:  ESOPHAGOGASTRODUODENOSCOPY (EGD) WITH PROPOFOL;  Surgeon: Pasty Spillers, MD;  Location: [facility];  Service: Endoscopy;  Laterality: N/A;  Latex allergy  . GIVENS CAPSULE STUDY N/A 12/27/2018   Procedure: GIVENS CAPSULE STUDY;  Surgeon: Pasty Spillers, MD;  Location: [facility] ENDOSCOPY;  Service: Endoscopy;  Laterality: N/A;    There were no vitals filed for this visit.   Subjective Assessment - 05/14/20 1035    Subjective Patient reports she is tired but feels good enough to participate in PT. She has been quaranteening at home with her daughter who had COVID19 over the holidays. Patient never tested positive, but did have some exacerabation of shortness of breath, anxiety, fatigue. She felt like she was "staring over." She talked with her doctor about this. She has never had an episode like this since having COVID19 the first time. She also found the holidays fatiguing but did have some time with her family she enjoyed. Her doctor changed/upped her medication for anxiety/depression. She still feels like something has changed and had trouble sleeping last night. She went to her speech therapy appointment yesterday and it was taxing with the high cognitive load as well as the anxiety associated with going. Felt that her speech therapist  was really nice. She is supposed to see speech therapy for 8 weeks. Has a bit of pain in the right glute region still. Thought it had gone away but it is back. Rates it 6/10. Rates fatigue today 4/10 where higher is worse.    Pertinent History Patient is a 40 y.o. female who presents to outpatient physical therapy with a referral for medical diagnosis history of COVID-19, physical deconditioning. This patient's chief complaints consist of decreased activity tolerance, fatigue, poor endurance, and difficulty with cognition leading to the following functional deficits: difficulty with all aspects of her life. Unable to work, difficulty with ADLs, dressing,  showering, washing the clothes, caring for her children, household and community ambulation, IADLs, difficulty with decision making and tasks requring executive function. Relevant past medical history and comorbidities include hypertension, graves disease, anemia, asthma, leg pain (no dx, takes lyrica), hand pain (went to see if she had RA, but said she did not).  Patient denies hx of cancer, stroke, seizures, major cardiac events, diabetes, unexplained weight loss, changes in bowel or bladder problems, new onset stumbling or dropping things.    How long can you walk comfortably? 15 minutes    Patient Stated Goals "get back to normal"    Currently in Pain? Yes    Pain Score 6            TREATMENT:  Therapeutic exercise:to centralize symptoms and improve ROM, strength, muscular endurance, and activity tolerance required for successful completion of functional activities. NuStep level 0 using bilateral upper and lower extremities. Seat/handle setting 8 For improved extremity mobility, muscular endurance, and activity tolerance; and to induce the analgesic effect of aerobic exercise, stimulate improved joint nutrition, and prepare body structures and systems for following interventions. x 15  Minutes. Average SPM = 74 - seated bicep curls, 3x10 with 6#DB (Self selected rest) - sit <> stand at 18 inch chair (green), 3x10 (self selected rest) - seated chest press, 3x10, at 15# (self selected rest) - standing dead lift with 20# KB, 3x8 (self selected rest).  - seated lat pull, 3x10, at 20# (self-selected rest)  Pt required multimodal cuing for proper technique and to facilitate improved neuromuscular control, strength, range of motion, and functional ability resulting in improved performance and form.   HOME EXERCISE PROGRAM Access Code: [email or web address] URL: [email or web address] Date: 04/11/2020 Prepared by: [name]  Program Notes Try to complete daily tasks in 2 minute  intervals with 1-2 min seated rest every 2 minutes. This will be annoying and a challenge.  Try to track how your energy feels overall throughout the day if you complete your tasks this way versus just doing each task non-stop until it is complete.  Get a chair to sit on in the shower to decrease energy cost while bathing.   Exercises Walking Seated Correct Posture Prone Press Up - 4 x daily - 10-15 reps - 1 second hold Standing Lumbar Extension - 4 x daily - 10-15 reps - 1 sets - 1 second hold    PT Education - 05/14/20 1044    Education Details Exercise purpose/form. Self-management techniques    Person(s) Educated Patient    Methods Explanation;Demonstration;Tactile cues;Verbal cues    Comprehension Verbalized understanding;Returned demonstration;Verbal cues required;Tactile cues required;Need further instruction            PT Short Term Goals - 04/16/20 1247      PT SHORT TERM GOAL #1   Title Be independent with initial home exercise program for self-management of  symptoms.    Baseline introduced pacing (03/05/2020);    Time 3    Period Weeks    Status Achieved    Target Date 03/26/20             PT Long Term Goals - 04/16/20 1102      PT LONG TERM GOAL #1   Title Be independent with a long-term home exercise program for self-management of symptoms.    Baseline introduced pacing (03/05/2020); participating in HEP as able (04/16/2020);    Time 12    Period Weeks    Status Partially Met   TARGET DATE FOR ALL LONG TERM GOALS: 05/28/2020     PT LONG TERM GOAL #2   Title Demonstrate improved FOTO score to 65 by visit #10 demonstrate improvement in overall condition and self-reported functional ability.    Baseline 51 (03/05/2020); 48 (04/16/2020);    Time 12    Period Weeks    Status On-going      PT LONG TERM GOAL #3   Title Patient will improve 6 minute walk test to equal or greater than 1400 feet with no assistive device to demonstrate improved community ambulation  for improved abliity to return to work (03/05/2020);    Baseline 800 feet and unable to walk longer than 5 minutes (03/05/2020); 1250 feet (04/16/2020);    Time 12    Period Weeks    Status Partially Met      PT LONG TERM GOAL #4   Title Pateint will complete 5 Time Sit To Stand Test in equal or less than 12 seconds from 18.5 inch surface with no UE support to demonstrate improved LE muscle performance to improve ability to perform household chores and work activities    Baseline 17 seconds from 18.5 inch plinth, no UE support (03/05/2020); 12 seconds from 18.5 inch plinth with no UE support (04/16/2020);    Time 12    Period Weeks    Status Achieved      PT LONG TERM GOAL #5   Title Complete community, work and/or recreational activities without limitation due to current condition    Baseline difficulty with all aspects of her life. Unable to work, difficulty with ADLs, dressing, showering, washing the clothes, caring for her children, household and community ambulation, IADLs, difficulty with decision making and executive function tasks (03/05/2020); continues to struggle with similar problems (04/16/2020);    Time 12    Period Weeks    Status On-going                 Plan - 05/14/20 1109    Clinical Impression Statement Patient tolerated treatment with some difficulty due to fatigue. Reduced overall volume and difficulty level from previous appointment due to time spent not exercising and experiencing an exacerbation in symptoms over the holidays. Plan to gradually increase activity at future visits as tolerated. Patient would benefit from continued management of limiting condition by skilled physical therapist to address remaining impairments and functional limitations to work towards stated goals and return to PLOF or maximal functional independence.    Personal Factors and Comorbidities Age;Comorbidity 3+;Time since onset of injury/illness/exacerbation;Past/Current Experience;Fitness     Comorbidities Relevant past medical history and comorbidities include hypertension, graves disease, anemia, asthma, leg pain (no dx, takes lyrica), hand pain (went to see if she had RA, but said she did not).    Examination-Activity Limitations Bathing;Hygiene/Grooming;Bed Mobility;Lift;Stairs;Locomotion Level;Stand;Caring for Others;Carry;Dressing    Examination-Participation Restrictions Laundry;Cleaning;Shop;Community Activity;Meal Prep;Driving;Occupation;Yard Work;Interpersonal Relationship   difficulty  with all aspects of her life. Unable to work, difficulty with ADLs, dressing, showering, washing the clothes, caring for her children, household and community ambulation, IADLs, difficulty with decision making and executive function tasks.   Stability/Clinical Decision Making Evolving/Moderate complexity    Rehab Potential Good    PT Frequency 2x / week    PT Duration 12 weeks    PT Treatment/Interventions ADLs/Self Care Home Management;Aquatic Therapy;Moist Heat;Cryotherapy;Therapeutic activities;Therapeutic exercise;Neuromuscular re-education;Cognitive remediation;Patient/family education;Manual techniques;Dry needling;Passive range of motion;Energy conservation;Spinal Manipulations;Joint Manipulations    PT Next Visit Plan pacing, low rep, high resistance strength training as tolerated, low duration endurance intervals    PT Home Exercise Plan Medbridge Access Code: [email or web address]    Consulted and Agree with Plan of Care Patient           Patient will benefit from skilled therapeutic intervention in order to improve the following deficits and impairments:  Decreased cognition,Impaired sensation,Pain,Cardiopulmonary status limiting activity,Decreased mobility,Impaired perceived functional ability,Decreased strength,Decreased range of motion,Decreased endurance,Decreased activity tolerance,Difficulty walking,Obesity  Visit Diagnosis: Muscle weakness (generalized)  Other symptoms and signs  involving the musculoskeletal system  Difficulty in walking, not elsewhere classified  Cervicalgia     Problem List Patient Active Problem List   Diagnosis Date Noted  . Vitamin D deficiency 04/22/2020  . 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 History of COVID-19 02/28/2020  . 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 Memory loss 02/28/2020  . Physical deconditioning 02/28/2020  . Dyspnea on exertion 02/28/2020  . Neck pain, bilateral 02/28/2020  . Muscle spasms of neck 02/28/2020  . Gallstones 01/30/2020  . COVID-19 01/23/2020  . Arthralgia of hand 01/24/2019  . CRP elevated 01/24/2019  . ESR raised 01/24/2019  . Class 3 severe obesity due to excess calories without serious comorbidity in adult ([place]) 01/24/2019  . Iron deficiency anemia   . Palpitations 09/16/2018  . Coronary artery calcification 09/16/2018  . 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 Bilateral leg edema 04/20/2017  . Incidental lung nodule, > 84m and < 83m11/28/2018  . Hives 09/02/2015  . Morbid obesity ([place]04/13/2017  . Anxiety 07/09/2015  . 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 Thyroid nodule 02/01/2015  . Vitamin D deficiency disease   . Vitamin B12 deficiency   . History of abnormal cervical Pap smear   . Allergy-induced asthma, mild intermittent, uncomplicated [id number]/[id number]  SaEverlean AlstromSnGraylon GoodPT, DPT 05/14/20, 11:12 AM  [facility] [id number]: [phone] Fax:  [phone]: TaLYNNLEY DODDRIDGERN: [id number] of Birth: [birth year]

## 2020-05-15 ENCOUNTER — Ambulatory Visit: Payer: Managed Care, Other (non HMO) | Admitting: Speech Pathology

## 2020-05-16 ENCOUNTER — Ambulatory Visit: Payer: Managed Care, Other (non HMO) | Admitting: Physical Therapy

## 2020-05-16 ENCOUNTER — Other Ambulatory Visit: Payer: Self-pay

## 2020-05-16 ENCOUNTER — Encounter: Payer: Self-pay | Admitting: Physical Therapy

## 2020-05-16 DIAGNOSIS — R262 Difficulty in walking, not elsewhere classified: Secondary | ICD-10-CM

## 2020-05-16 DIAGNOSIS — M6281 Muscle weakness (generalized): Secondary | ICD-10-CM

## 2020-05-16 DIAGNOSIS — R29898 Other symptoms and signs involving the musculoskeletal system: Secondary | ICD-10-CM

## 2020-05-16 DIAGNOSIS — R41841 Cognitive communication deficit: Secondary | ICD-10-CM | POA: Diagnosis not present

## 2020-05-16 DIAGNOSIS — M542 Cervicalgia: Secondary | ICD-10-CM

## 2020-05-16 NOTE — Therapy (Signed)
[facility] [address] [place], [address] Phone: [phone]   Fax:  [phone]  Physical Therapy Treatment  Patient Details  Name: Catherine Berg MRN: [id number] Date of Birth: Nov 07, 1980 Referring Provider (PT): Kriste Basque. Nils Pyle, NP   Encounter Date: 05/16/2020   PT End of Session - 05/16/20 1039    Visit Number 15    Number of Visits 24    Date for PT Re-Evaluation 05/28/20    Authorization Type [employer] MANAGED reporting period from 04/18/2020    Progress Note Due on Visit 20    PT Start Time 1032    PT Stop Time 1112    PT Time Calculation (min) 40 min    Activity Tolerance Patient limited by fatigue    Behavior During Therapy [facility] for tasks assessed/performed           Past Medical History:  Diagnosis Date  . Abnormal thyroid blood test   . Anemia   . Anxiety   . Asthma   . Complication of anesthesia    itching after c sections  . 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 Graves disease   . History of abnormal cervical Pap smear   . History of cervical polypectomy   . Hives 09/02/2015  . Hypertension   . IFG (impaired fasting glucose)   . Incidental lung nodule, > 65mm and < 55mm 03/31/2017   4 mm RLL lung nodule on chest CT Mar 31, 2017  . Low serum vitamin D   . Obesity   . Pregnancy induced hypertension    with 1st pregnancy, normal pressure with 2nd  . Reflux   . Thyroid disease   . Vitamin B12 deficiency   . Vitamin D deficiency disease   . Wears dentures    full upper    Past Surgical History:  Procedure Laterality Date  . CERVICAL POLYPECTOMY    . CESAREAN SECTION     x 2  . COLONOSCOPY WITH PROPOFOL N/A 11/14/2018   Procedure: COLONOSCOPY WITH PROPOFOL;  Surgeon: Virgel Manifold, MD;  Location: [facility];  Service: Endoscopy;  Laterality: N/A;  . ESOPHAGOGASTRODUODENOSCOPY (EGD) WITH PROPOFOL N/A 11/14/2018   Procedure:  ESOPHAGOGASTRODUODENOSCOPY (EGD) WITH PROPOFOL;  Surgeon: Virgel Manifold, MD;  Location: [facility];  Service: Endoscopy;  Laterality: N/A;  Latex allergy  . GIVENS CAPSULE STUDY N/A 12/27/2018   Procedure: GIVENS CAPSULE STUDY;  Surgeon: Virgel Manifold, MD;  Location: [facility];  Service: Endoscopy;  Laterality: N/A;    There were no vitals filed for this visit.   Subjective Assessment - 05/16/20 1036    Subjective Patient reports she was tired and sore following last treatment session but it was acceptable to her and she is feeling better now. States she missed speech therapy yesterday because she forgot she had a school meeting with her nephew who came to live with her in June. She has some mild pain in the right glute region, but it is better than last session, currently rated 3/10.    Pertinent History Patient is a 40 y.o. female who presents to outpatient physical therapy with a referral for medical diagnosis history of COVID-19, physical deconditioning. This patient's chief complaints consist of decreased activity tolerance, fatigue, poor endurance, and difficulty with cognition leading to the following functional deficits: difficulty with all aspects of her life. Unable to work, difficulty with ADLs, dressing, showering, washing the clothes, caring  for her children, household and community ambulation, IADLs, difficulty with decision making and tasks requring executive function. Relevant past medical history and comorbidities include hypertension, graves disease, anemia, asthma, leg pain (no dx, takes lyrica), hand pain (went to see if she had RA, but said she did not).  Patient denies hx of cancer, stroke, seizures, major cardiac events, diabetes, unexplained weight loss, changes in bowel or bladder problems, new onset stumbling or dropping things.    How long can you walk comfortably? 15 minutes    Patient Stated Goals "get back to normal"    Currently in Pain? Yes     Pain Score 3             TREATMENT:  Therapeutic exercise:to centralize symptoms and improve ROM, strength, muscular endurance, and activity tolerance required for successful completion of functional activities. - NuStep level 0 using bilateral upper and lower extremities. Seat/handle setting 8 For improved extremity mobility, muscular endurance, and activity tolerance; and to induce the analgesic effect of aerobic exercise, stimulate improved joint nutrition, and prepare body structures and systems for following interventions. x 10  Minutes. Average SPM = 83 - seated bicep curls, 3x10 with6#DB (Self selected rest) - sit <> stand at 17 inch chair(grey),3x10 (self selected rest) - seated chest press, 3x10, at20# (self selected rest) - standing dead lift with20# KB, 3x10 (self selected rest). - seated lat pull, 3x10, at 25# (self-selected rest) - step up to 6 inch step with no UE support, 3x10 each side. (self selected rest)  Pt required multimodal cuing for proper technique and to facilitate improved neuromuscular control, strength, range of motion, and functional ability resulting in improved performance and form.   HOME EXERCISE PROGRAM Access Code: [email or web address] URL: [email or web address] Date: 04/11/2020 Prepared by: [name]  Program Notes Try to complete daily tasks in 2 minute intervals with 1-2 min seated rest every 2 minutes. This will be annoying and a challenge.  Try to track how your energy feels overall throughout the day if you complete your tasks this way versus just doing each task non-stop until it is complete.  Get a chair to sit on in the shower to decrease energy cost while bathing.   Exercises Walking Seated Correct Posture Prone Press Up - 4 x daily - 10-15 reps - 1 second hold Standing Lumbar Extension - 4 x daily - 10-15 reps - 1 sets - 1 second hold    PT Education - 05/16/20 1039    Education Details Exercise purpose/form.  Self-management techniques    Person(s) Educated Patient    Methods Explanation;Demonstration    Comprehension Verbalized understanding;Returned demonstration;Verbal cues required;Tactile cues required;Need further instruction            PT Short Term Goals - 04/16/20 1247      PT SHORT TERM GOAL #1   Title Be independent with initial home exercise program for self-management of symptoms.    Baseline introduced pacing (03/05/2020);    Time 3    Period Weeks    Status Achieved    Target Date 03/26/20             PT Long Term Goals - 04/16/20 1102      PT LONG TERM GOAL #1   Title Be independent with a long-term home exercise program for self-management of symptoms.    Baseline introduced pacing (03/05/2020); participating in HEP as able (04/16/2020);    Time 12    Period Weeks    Status Partially Met  TARGET DATE FOR ALL LONG TERM GOALS: 05/28/2020     PT LONG TERM GOAL #2   Title Demonstrate improved FOTO score to 65 by visit #10 demonstrate improvement in overall condition and self-reported functional ability.    Baseline 51 (03/05/2020); 48 (04/16/2020);    Time 12    Period Weeks    Status On-going      PT LONG TERM GOAL #3   Title Patient will improve 6 minute walk test to equal or greater than 1400 feet with no assistive device to demonstrate improved community ambulation for improved abliity to return to work (03/05/2020);    Baseline 800 feet and unable to walk longer than 5 minutes (03/05/2020); 1250 feet (04/16/2020);    Time 12    Period Weeks    Status Partially Met      PT LONG TERM GOAL #4   Title Pateint will complete 5 Time Sit To Stand Test in equal or less than 12 seconds from 18.5 inch surface with no UE support to demonstrate improved LE muscle performance to improve ability to perform household chores and work activities    Baseline 17 seconds from 18.5 inch plinth, no UE support (03/05/2020); 12 seconds from 18.5 inch plinth with no UE support  (04/16/2020);    Time 12    Period Weeks    Status Achieved      PT LONG TERM GOAL #5   Title Complete community, work and/or recreational activities without limitation due to current condition    Baseline difficulty with all aspects of her life. Unable to work, difficulty with ADLs, dressing, showering, washing the clothes, caring for her children, household and community ambulation, IADLs, difficulty with decision making and executive function tasks (03/05/2020); continues to struggle with similar problems (04/16/2020);    Time 12    Period Weeks    Status On-going                 Plan - 05/16/20 1110    Clinical Impression Statement Patient tolerated treatment well overall and was able to progress to higher volume today. Continues to be limited by fatigue. Patient would benefit from continued management of limiting condition by skilled physical therapist to address remaining impairments and functional limitations to work towards stated goals and return to PLOF or maximal functional independence.    Personal Factors and Comorbidities Age;Comorbidity 3+;Time since onset of injury/illness/exacerbation;Past/Current Experience;Fitness    Comorbidities Relevant past medical history and comorbidities include hypertension, graves disease, anemia, asthma, leg pain (no dx, takes lyrica), hand pain (went to see if she had RA, but said she did not).    Examination-Activity Limitations Bathing;Hygiene/Grooming;Bed Mobility;Lift;Stairs;Locomotion Level;Stand;Caring for Others;Carry;Dressing    Examination-Participation Restrictions Laundry;Cleaning;Shop;Community Activity;Meal Prep;Driving;Occupation;Yard Work;Interpersonal Relationship   difficulty with all aspects of her life. Unable to work, difficulty with ADLs, dressing, showering, washing the clothes, caring for her children, household and community ambulation, IADLs, difficulty with decision making and executive function tasks.    Stability/Clinical Decision Making Evolving/Moderate complexity    Rehab Potential Good    PT Frequency 2x / week    PT Duration 12 weeks    PT Treatment/Interventions ADLs/Self Care Home Management;Aquatic Therapy;Moist Heat;Cryotherapy;Therapeutic activities;Therapeutic exercise;Neuromuscular re-education;Cognitive remediation;Patient/family education;Manual techniques;Dry needling;Passive range of motion;Energy conservation;Spinal Manipulations;Joint Manipulations    PT Next Visit Plan pacing, low rep, high resistance strength training as tolerated, low duration endurance intervals    PT Home Exercise Plan Medbridge Access Code: [email or web address]    Consulted and Agree with Plan of Care  Patient           Patient will benefit from skilled therapeutic intervention in order to improve the following deficits and impairments:  Decreased cognition,Impaired sensation,Pain,Cardiopulmonary status limiting activity,Decreased mobility,Impaired perceived functional ability,Decreased strength,Decreased range of motion,Decreased endurance,Decreased activity tolerance,Difficulty walking,Obesity  Visit Diagnosis: Muscle weakness (generalized)  Other symptoms and signs involving the musculoskeletal system  Difficulty in walking, not elsewhere classified  Cervicalgia     Problem List Patient Active Problem List   Diagnosis Date Noted  . Vitamin D deficiency 04/22/2020  . 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 History of COVID-19 02/28/2020  . 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 Memory loss 02/28/2020  . Physical deconditioning 02/28/2020  . Dyspnea on exertion 02/28/2020  . Neck pain, bilateral 02/28/2020  . Muscle spasms of neck 02/28/2020  . Gallstones 01/30/2020  . COVID-19 01/23/2020  . Arthralgia of hand 01/24/2019  . CRP elevated 01/24/2019  . ESR raised 01/24/2019  . Class 3 severe obesity due to excess calories without serious comorbidity in adult ([place]) 01/24/2019   . Iron deficiency anemia   . Palpitations 09/16/2018  . Coronary artery calcification 09/16/2018  . 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 Bilateral leg edema 04/20/2017  . Incidental lung nodule, > 58m and < 866m11/28/2018  . Hives 09/02/2015  . Morbid obesity ([facility]04/13/2017  . Anxiety 07/09/2015  . 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 Thyroid nodule 02/01/2015  . Vitamin D deficiency disease   . Vitamin B12 deficiency   . History of abnormal cervical Pap smear   . Allergy-induced asthma, mild intermittent, uncomplicated 0635/67/0141  SaEverlean AlstromSnGraylon GoodPT, DPT 05/16/20, 11:11 AM  [facility] [address]hone: [phone] Fax:  [phone]: Catherine SNOKERN: [id number] of Birth: 9/27-Jan-1981

## 2020-05-20 ENCOUNTER — Encounter: Payer: Managed Care, Other (non HMO) | Admitting: Speech Pathology

## 2020-05-21 ENCOUNTER — Ambulatory Visit: Payer: Managed Care, Other (non HMO) | Admitting: Physical Therapy

## 2020-05-22 ENCOUNTER — Other Ambulatory Visit: Payer: Self-pay | Admitting: Family Medicine

## 2020-05-22 ENCOUNTER — Ambulatory Visit: Payer: Managed Care, Other (non HMO) | Admitting: Speech Pathology

## 2020-05-22 ENCOUNTER — Other Ambulatory Visit: Payer: Self-pay

## 2020-05-22 DIAGNOSIS — N921 Excessive and frequent menstruation with irregular cycle: Secondary | ICD-10-CM

## 2020-05-22 DIAGNOSIS — R41840 Attention and concentration deficit: Secondary | ICD-10-CM

## 2020-05-22 DIAGNOSIS — Z975 Presence of (intrauterine) contraceptive device: Secondary | ICD-10-CM

## 2020-05-22 DIAGNOSIS — R1900 Intra-abdominal and pelvic swelling, mass and lump, unspecified site: Secondary | ICD-10-CM

## 2020-05-22 DIAGNOSIS — R41841 Cognitive communication deficit: Secondary | ICD-10-CM | POA: Diagnosis not present

## 2020-05-22 DIAGNOSIS — R102 Pelvic and perineal pain: Secondary | ICD-10-CM

## 2020-05-22 NOTE — Therapy (Signed)
[place] MAIN [facility] SERVICES [address] [place], [place], [address] Phone: [phone]   Fax:  [phone]  Speech Language Pathology Treatment  Patient Details  Name: Catherine Berg MRN: [id number] Date of Birth: 10/23/1980 Referring Provider (SLP): Lazaro Arms, NP   Encounter Date: 05/22/2020   End of Session - 05/22/20 1559    Visit Number 2    Number of Visits 17    Date for SLP Re-Evaluation 07/12/20    Authorization Type [employer]    Authorization - Visit Number 2    Progress Note Due on Visit 10    SLP Start Time 1100    SLP Stop Time  1150    SLP Time Calculation (min) 50 min    Activity Tolerance Patient tolerated treatment well           Past Medical History:  Diagnosis Date  . Abnormal thyroid blood test   . Anemia   . Anxiety   . Asthma   . Complication of anesthesia    itching after c sections  . 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 Graves disease   . History of abnormal cervical Pap smear   . History of cervical polypectomy   . Hives 09/02/2015  . Hypertension   . IFG (impaired fasting glucose)   . Incidental lung nodule, > 96m and < 876m11/28/2018   4 mm RLL lung nodule on chest CT Mar 31, 2017  . Low serum vitamin D   . Obesity   . Pregnancy induced hypertension    with 1st pregnancy, normal pressure with 2nd  . Reflux   . Thyroid disease   . Vitamin B12 deficiency   . Vitamin D deficiency disease   . Wears dentures    full upper    Past Surgical History:  Procedure Laterality Date  . CERVICAL POLYPECTOMY    . CESAREAN SECTION     x 2  . COLONOSCOPY WITH PROPOFOL N/A 11/14/2018   Procedure: COLONOSCOPY WITH PROPOFOL;  Surgeon: TaVirgel ManifoldMD;  [place]: [place];  Laterality: N/A;  . ESOPHAGOGASTRODUODENOSCOPY (EGD) WITH PROPOFOL N/A 11/14/2018   Procedure: ESOPHAGOGASTRODUODENOSCOPY (EGD) WITH PROPOFOL;  Surgeon: TaVirgel ManifoldMD;  [place]: [place];  Laterality: N/A;  Latex allergy  . GIVENS CAPSULE STUDY N/A 12/27/2018   Procedure: GIVENS CAPSULE STUDY;  Surgeon: TaVirgel ManifoldMD;  Location: [facility];  Service: Endoscopy;  Laterality: N/A;    There were no vitals filed for this visit.   Subjective Assessment - 05/22/20 1558    Subjective "I have to ask my kids to help me"    Currently in Pain? No/denies                 ADULT SLP TREATMENT - 05/22/20 1558      General Information   Behavior/Cognition Alert;Cooperative    HPI Catherine Berg a [age].o. 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 Past medical history is noted for anxiety, asthma, Graves disease, HTN.      Treatment Provided   Treatment provided Cognitive-Linquistic      Pain Assessment   Pain Assessment No/denies pain      Cognitive-Linquistic Treatment   Treatment focused on Cognition;Patient/family/caregiver education    Skilled Treatment Patient reports frustration with not being able to function the way she did previously; she  is used to keeping up with her kids schedules and managing the household without difficulty. Today we discussed compensations for executive function, attention and memory. Patient used daily planner to input her upcoming appointments with min cues initially, fading to supervision. Pt ID'd additional items to include with min-mod cues. See pt instructions for details. Patient is to finish updating her appointments, include her kids' schedules, bill due dates, and errands in her memory system. Pt used memo app on her phone to generate list for grocery items, errand she planned to run after session. Also discussed using list to help her recall topics to address with her medical providers.      Assessment / Recommendations / Plan   Plan Continue with current plan of care       Progression Toward Goals   Progression toward goals Progressing toward goals            SLP Education - 05/22/20 1559    Education Details compensations for memory, executive function    Person(s) Educated Patient    Methods Explanation;Handout;Verbal cues    Comprehension Verbalized understanding;Returned demonstration;Verbal cues required;Need further instruction            SLP Short Term Goals - 05/13/20 1453      SLP [place] #1   Title Patient will obtain a system for recall/executive function and bring to at least 3 therapy sessions.    Time 4    Period Weeks    Status New    Target Date 06/12/20      SLP SHORT TERM GOAL #2   Title Patient will report no forgotten items at the grocery store using compensations for recall.    Time 4    Period Weeks    Status New            SLP Long Term Goals - 05/13/20 1457      SLP LONG TERM GOAL #1   Title Patient will use external aids/strategies to run 2+ errands in a row.    Time 8    Period Weeks    Status New      SLP LONG TERM GOAL #2   Title Patient will report no missed medications or bills 4 visits in a row using memory compensations.    Time 8    Period Weeks    Status New      SLP LONG TERM GOAL #3   Title Patient will use compensations for attention/information processing to complete mod complex cognitive linguistic tasks >85% accuracy.    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 Deficits in executive function, short-term memory, higher-level attention and slow processing are impacting her ability to manage household tasks and finances. She is receptive to using strategies and compensations; training initiated in these today. I recommend skilled ST to train pt in compensations and strategies to improve her attention, recall and  organization skills in order to improve function at work and home.    Speech Therapy Frequency 2x / week    Duration 8 weeks    Treatment/Interventions Environmental controls;Cueing hierarchy;SLP instruction and feedback;Cognitive reorganization;Compensatory techniques;Functional tasks;Compensatory strategies;Internal/external aids;Patient/family education    Potential to Achieve Goals Good    Consulted and Agree with Plan of Care Patient  Patient will benefit from skilled therapeutic intervention in order to improve the following deficits and impairments:   Cognitive communication deficit  COVID-19 long hauler manifesting chronic concentration deficit    Problem List Patient Active Problem List   Diagnosis Date Noted  . Vitamin D deficiency 04/22/2020  . 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 History of COVID-19 02/28/2020  . 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 Memory loss 02/28/2020  . Physical deconditioning 02/28/2020  . Dyspnea on exertion 02/28/2020  . Neck pain, bilateral 02/28/2020  . Muscle spasms of neck 02/28/2020  . Gallstones 01/30/2020  . COVID-19 01/23/2020  . Arthralgia of hand 01/24/2019  . CRP elevated 01/24/2019  . ESR raised 01/24/2019  . Class 3 severe obesity due to excess calories without serious comorbidity in adult ([place]) 01/24/2019  . Iron deficiency anemia   . Palpitations 09/16/2018  . Coronary artery calcification 09/16/2018  . 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 Bilateral leg edema 04/20/2017  . Incidental lung nodule, > 42m and < 889m11/28/2018  . Hives 09/02/2015  . Morbid obesity ([facility]04/13/2017  . Anxiety 07/09/2015  . 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 Thyroid nodule 02/01/2015  . Vitamin D deficiency disease   . Vitamin B12 deficiency   . History of abnormal cervical Pap smear   . Allergy-induced asthma, mild intermittent, uncomplicated [id number] MaDeneise LeverMSWinthrop CCC-SLP Speech-Language Pathologist  MaAliene Altes/19/2022, 4:02 PM  [facility] [id number]one: [phone] Fax:  [phone] Name: Catherine Berg: [id number] of Birth: 9/12-13-1982

## 2020-05-22 NOTE — Patient Instructions (Signed)
Try to write things down to help free up some mental space. Make a routine of checking your planner: at the end of the day, when you plan for the next day, first thing in the morning, and throughout the day as needed. Keep it with you in your purse, so you can write down information or reference it if you need to.   In your planner: Write down upcoming appointments Write in bill due dates Write in your kid's school/extracurricular schedule Write down any errands you plan to run that day  In your phone: Use your memo app to make your grocery checklist. If you run out of something, or think of something to add, put it on the list right away. (no trying to remember to remember)  For appointments: Make a list of questions for your medical provider in advance and use your list to help you remember to ask everything when you are in your session.

## 2020-05-23 ENCOUNTER — Ambulatory Visit: Payer: Managed Care, Other (non HMO)

## 2020-05-23 ENCOUNTER — Other Ambulatory Visit: Payer: Self-pay

## 2020-05-23 DIAGNOSIS — R41841 Cognitive communication deficit: Secondary | ICD-10-CM

## 2020-05-23 DIAGNOSIS — R41840 Attention and concentration deficit: Secondary | ICD-10-CM

## 2020-05-23 DIAGNOSIS — M6281 Muscle weakness (generalized): Secondary | ICD-10-CM

## 2020-05-23 NOTE — Therapy (Signed)
[place] [facility] [address], [place], [address] Phone: [phone]   Fax:  [phone]  Physical Therapy Treatment  Patient Details  Name: Catherine Berg MRN: [id number] Date of Birth: 11-30-1980 Referring Provider (PT): Kriste Basque. Nils Pyle, NP   Encounter Date: 05/23/2020   PT End of Session - 05/23/20 1047    Visit Number 16    Number of Visits 24    Date for PT Re-Evaluation 05/28/20    Authorization Type [employer] MANAGED reporting period from 04/18/2020    Progress Note Due on Visit 20    PT Start Time 1030    PT Stop Time 1115    PT Time Calculation (min) 45 min    Activity Tolerance Patient limited by fatigue    Behavior During Therapy [facility] for tasks assessed/performed           Past Medical History:  Diagnosis Date  . Abnormal thyroid blood test   . Anemia   . Anxiety   . Asthma   . Complication of anesthesia    itching after c sections  . 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 Graves disease   . History of abnormal cervical Pap smear   . History of cervical polypectomy   . Hives 09/02/2015  . Hypertension   . IFG (impaired fasting glucose)   . Incidental lung nodule, > 10m and < 865m11/28/2018   4 mm RLL lung nodule on chest CT Mar 31, 2017  . Low serum vitamin D   . Obesity   . Pregnancy induced hypertension    with 1st pregnancy, normal pressure with 2nd  . Reflux   . Thyroid disease   . Vitamin B12 deficiency   . Vitamin D deficiency disease   . Wears dentures    full upper    Past Surgical History:  Procedure Laterality Date  . CERVICAL POLYPECTOMY    . CESAREAN SECTION     x 2  . COLONOSCOPY WITH PROPOFOL N/A 11/14/2018   Procedure: COLONOSCOPY WITH PROPOFOL;  Surgeon: TaVirgel ManifoldMD;  [facility];  Laterality: N/A;  . ESOPHAGOGASTRODUODENOSCOPY (EGD) WITH PROPOFOL N/A 11/14/2018   Procedure:  ESOPHAGOGASTRODUODENOSCOPY (EGD) WITH PROPOFOL;  Surgeon: TaVirgel ManifoldMD;  [place];  Laterality: N/A;  Latex allergy  . GIVENS CAPSULE STUDY N/A 12/27/2018   Procedure: GIVENS CAPSULE STUDY;  Surgeon: TaVirgel ManifoldMD;  Location: [facility] ENDOSCOPY;  Service: Endoscopy;  Laterality: N/A;    There were no vitals filed for this visit.   Subjective Assessment - 05/23/20 1038    Subjective Patient states she felt tired after the previous session but not over extered. Patient reports she is doing well this session.    Pertinent History Patient is a [age].o. female who presents to outpatient physical therapy with a referral for medical diagnosis history of COVID-19, physical deconditioning. This patient's chief complaints consist of decreased activity tolerance, fatigue, poor endurance, and difficulty with cognition leading to the following functional deficits: difficulty with all aspects of her life. Unable to work, difficulty with ADLs, dressing, showering, washing the clothes, caring for her children, household and community ambulation, IADLs, difficulty with decision making and tasks requring executive function. Relevant past medical history and comorbidities include hypertension, graves disease, anemia, asthma, leg pain (no dx, takes lyrica), hand pain (went to see if she had RA, but said she  did not).  Patient denies hx of cancer, stroke, seizures, major cardiac events, diabetes, unexplained weight loss, changes in bowel or bladder problems, new onset stumbling or dropping things.    How long can you walk comfortably? 15 minutes    Patient Stated Goals "get back to normal"    Currently in Pain? No/denies          TREATMENT   Therapeutic exercise: to centralize symptoms and improve ROM, strength, muscular endurance, and activity tolerance required for successful completion of functional activities.  - NuStep level 0 using bilateral upper and  lower extremities. Seat/handle setting 8 For improved extremity mobility, muscular endurance, and activity tolerance; and to induce the analgesic effect of aerobic exercise, stimulate improved joint nutrition, and prepare body structures and systems for following interventions. x 10  Minutes. Average SPM = 83 - seated bicep curls, 3x10 with 6#DB (Self selected rest) - sit <> stand at 17 inch chair (grey), x10 (self selected rest); 2 x 10 with shoulder flexion at top of motion - seated chest press, 3x10, at 25# (self selected rest) - standing dead lift with 20# KB, 3x10 (self selected rest) - hammer curl to Shoulder press with 6# dbs (self selected rest) - step up to 6 inch step with no UE support, 3x10 each side. (self selected rest)    Pt required multimodal cuing for proper technique and to facilitate improved neuromuscular control, strength, range of motion, and functional ability resulting in improved performance and form.        PT Education - 05/23/20 1045    Education Details form/technique with exercise    Person(s) Educated Patient    Methods Explanation;Demonstration    Comprehension Verbalized understanding;Returned demonstration            PT Short Term Goals - 04/16/20 1247      PT SHORT TERM GOAL #1   Title Be independent with initial home exercise program for self-management of symptoms.    Baseline introduced pacing (03/05/2020);    Time 3    Period Weeks    Status Achieved    Target Date 03/26/20             PT Long Term Goals - 04/16/20 1102      PT LONG TERM GOAL #1   Title Be independent with a long-term home exercise program for self-management of symptoms.    Baseline introduced pacing (03/05/2020); participating in HEP as able (04/16/2020);    Time 12    Period Weeks    Status Partially Met   TARGET DATE FOR ALL LONG TERM GOALS: 05/28/2020     PT LONG TERM GOAL #2   Title Demonstrate improved FOTO score to 65 by visit #10 demonstrate improvement  in overall condition and self-reported functional ability.    Baseline 51 (03/05/2020); 48 (04/16/2020);    Time 12    Period Weeks    Status On-going      PT LONG TERM GOAL #3   Title Patient will improve 6 minute walk test to equal or greater than 1400 feet with no assistive device to demonstrate improved community ambulation for improved abliity to return to work (03/05/2020);    Baseline 800 feet and unable to walk longer than 5 minutes (03/05/2020); 1250 feet (04/16/2020);    Time 12    Period Weeks    Status Partially Met      PT LONG TERM GOAL #4   Title Pateint will complete 5 Time Sit To Stand  Test in equal or less than 12 seconds from 18.5 inch surface with no UE support to demonstrate improved LE muscle performance to improve ability to perform household chores and work activities    Baseline 17 seconds from 18.5 inch plinth, no UE support (03/05/2020); 12 seconds from 18.5 inch plinth with no UE support (04/16/2020);    Time 12    Period Weeks    Status Achieved      PT LONG TERM GOAL #5   Title Complete community, work and/or recreational activities without limitation due to current condition    Baseline difficulty with all aspects of her life. Unable to work, difficulty with ADLs, dressing, showering, washing the clothes, caring for her children, household and community ambulation, IADLs, difficulty with decision making and executive function tasks (03/05/2020); continues to struggle with similar problems (04/16/2020);    Time 12    Period Weeks    Status On-going                 Plan - 05/23/20 1049    Clinical Impression Statement Patient does well during today's session, progressed patient to tolerance with exercises during today's session. Patient does well overall, no increase in pain during session. Patient will benefit from further skilled therapy to return to prior level of function and improve ABLA.    Personal Factors and Comorbidities Age;Comorbidity 3+;Time  since onset of injury/illness/exacerbation;Past/Current Experience;Fitness    Comorbidities Relevant past medical history and comorbidities include hypertension, graves disease, anemia, asthma, leg pain (no dx, takes lyrica), hand pain (went to see if she had RA, but said she did not).    Examination-Activity Limitations Bathing;Hygiene/Grooming;Bed Mobility;Lift;Stairs;Locomotion Level;Stand;Caring for Others;Carry;Dressing    Examination-Participation Restrictions Laundry;Cleaning;Shop;Community Activity;Meal Prep;Driving;Occupation;Yard Work;Interpersonal Relationship   difficulty with all aspects of her life. Unable to work, difficulty with ADLs, dressing, showering, washing the clothes, caring for her children, household and community ambulation, IADLs, difficulty with decision making and executive function tasks.   Stability/Clinical Decision Making Evolving/Moderate complexity    Rehab Potential Good    PT Frequency 2x / week    PT Duration 12 weeks    PT Treatment/Interventions ADLs/Self Care Home Management;Aquatic Therapy;Moist Heat;Cryotherapy;Therapeutic activities;Therapeutic exercise;Neuromuscular re-education;Cognitive remediation;Patient/family education;Manual techniques;Dry needling;Passive range of motion;Energy conservation;Spinal Manipulations;Joint Manipulations    PT Next Visit Plan pacing, low rep, high resistance strength training as tolerated, low duration endurance intervals    PT Home Exercise Plan Medbridge Access Code: [email or web address]    Consulted and Agree with Plan of Care Patient           Patient will benefit from skilled therapeutic intervention in order to improve the following deficits and impairments:  Decreased cognition,Impaired sensation,Pain,Cardiopulmonary status limiting activity,Decreased mobility,Impaired perceived functional ability,Decreased strength,Decreased range of motion,Decreased endurance,Decreased activity tolerance,Difficulty walking,Obesity  Visit  Diagnosis: Cognitive communication deficit  COVID-19 long hauler manifesting chronic concentration deficit  Muscle weakness (generalized)     Problem List Patient Active Problem List   Diagnosis Date Noted  . Vitamin D deficiency 04/22/2020  . 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 History of COVID-19 02/28/2020  . 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 Memory loss 02/28/2020  . Physical deconditioning 02/28/2020  . Dyspnea on exertion 02/28/2020  . Neck pain, bilateral 02/28/2020  . Muscle spasms of neck 02/28/2020  . Gallstones 01/30/2020  . COVID-19 01/23/2020  . Arthralgia of hand 01/24/2019  . CRP elevated 01/24/2019  . ESR raised 01/24/2019  . Class 3 severe obesity due to excess calories without serious comorbidity  in adult [facility]) 01/24/2019  . Iron deficiency anemia   . Palpitations 09/16/2018  . Coronary artery calcification 09/16/2018  . 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 Bilateral leg edema 04/20/2017  . Incidental lung nodule, > 36m and < 868m11/28/2018  . Hives 09/02/2015  . Morbid obesity ([place] [place]/13/2017  . Anxiety 07/09/2015  . 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 Thyroid nodule 02/01/2015  . Vitamin D deficiency disease   . Vitamin B12 deficiency   . History of abnormal cervical Pap smear   . Allergy-induced asthma, mild intermittent, uncomplicated 0683/[id number]/[id number]  WeBlythe StanfordPT DPT 05/23/2020, 10:59 AM  [facility] [address]hone: [phone] Fax:  [phone]: TaLACARA DUNSWORTHRN: [id number] of Birth: 9/Jan 13, 1981

## 2020-05-27 ENCOUNTER — Other Ambulatory Visit: Payer: Self-pay

## 2020-05-27 ENCOUNTER — Ambulatory Visit: Payer: Managed Care, Other (non HMO) | Admitting: Speech Pathology

## 2020-05-27 DIAGNOSIS — R41840 Attention and concentration deficit: Secondary | ICD-10-CM

## 2020-05-27 DIAGNOSIS — R41841 Cognitive communication deficit: Secondary | ICD-10-CM

## 2020-05-27 NOTE — Therapy (Signed)
[place] MAIN [facility] SERVICES [address] [place], [place], [address] Phone: [phone]   Fax:  [phone]  Speech Language Pathology Treatment  Patient Details  Name: Catherine Berg MRN: [id number] Date of Birth: 03-Apr-1981 Referring Provider (SLP): Lazaro Arms, NP   Encounter Date: 05/27/2020   End of Session - 05/27/20 1256    Visit Number 3    Number of Visits 17    Date for SLP Re-Evaluation 07/12/20    Authorization Type [employer]    Authorization - Visit Number 3    Progress Note Due on Visit 10    SLP Start Time 1002    SLP Stop Time  1055    SLP Time Calculation (min) 53 min    Activity Tolerance Patient tolerated treatment well           Past Medical History:  Diagnosis Date  . Abnormal thyroid blood test   . Anemia   . Anxiety   . Asthma   . Complication of anesthesia    itching after c sections  . 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 Graves disease   . History of abnormal cervical Pap smear   . History of cervical polypectomy   . Hives 09/02/2015  . Hypertension   . IFG (impaired fasting glucose)   . Incidental lung nodule, > 96m and < 839m11/28/2018   4 mm RLL lung nodule on chest CT Mar 31, 2017  . Low serum vitamin D   . Obesity   . Pregnancy induced hypertension    with 1st pregnancy, normal pressure with 2nd  . Reflux   . Thyroid disease   . Vitamin B12 deficiency   . Vitamin D deficiency disease   . Wears dentures    full upper    Past Surgical History:  Procedure Laterality Date  . CERVICAL POLYPECTOMY    . CESAREAN SECTION     x 2  . COLONOSCOPY WITH PROPOFOL N/A 11/14/2018   Procedure: COLONOSCOPY WITH PROPOFOL;  Surgeon: TaVirgel ManifoldMD;  [place]: [place];  Laterality: N/A;  . ESOPHAGOGASTRODUODENOSCOPY (EGD) WITH PROPOFOL N/A 11/14/2018   Procedure: ESOPHAGOGASTRODUODENOSCOPY (EGD) WITH PROPOFOL;  Surgeon: TaVirgel ManifoldMD;  [place];  Laterality: N/A;  Latex allergy  . GIVENS CAPSULE STUDY N/A 12/27/2018   Procedure: GIVENS CAPSULE STUDY;  Surgeon: TaVirgel ManifoldMD;  Location: [facility] ENDOSCOPY;  Service: Endoscopy;  Laterality: N/A;    There were no vitals filed for this visit.   Subjective Assessment - 05/27/20 1248    Subjective "I filled it in for February"    Currently in Pain? No/denies                 ADULT SLP TREATMENT - 05/27/20 1249      General Information   Behavior/Cognition Alert;Cooperative    HPI Catherine Berg a [age].o. 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 Past medical history is noted for anxiety, asthma, Graves disease, HTN.      Treatment Provided   Treatment provided Cognitive-Linquistic      Pain Assessment   Pain Assessment No/denies pain      Cognitive-Linquistic Treatment   Treatment focused on Cognition;Patient/family/caregiver education    Skilled Treatment Patient added upcoming appointments to her planner; did not add kids' schedules yet. She has several appointments this  week; reports she feels anxious when she has a lot to do; she anticipates how mentally fatigued she will be. Education on energy conservation as well as pre-planning/preparation to reduce cognitive demands, such as planning her meals ahead of time, preparing a list of questions in advance for her MD. She often avoids attending her son's basketball games due to sensory overload. Education on using modifications such as earplugs and a hat or dark glasses to minimize sensory input. She reports cooking smaller, simpler meals; she typically cooks from memory and has been having difficulty following the steps of more complicated recipes. Pt strategized today that she could write the steps down in advance to assist. Completed this for one of her standard recipes today  with mod cues (to list all supplies, simplify but include appropriate details in her checklist). Encouraged pt to use checklists such as this when cooking. She is to work on Estate agent a more difficult recipe at home [facility]).      Assessment / Recommendations / Plan   Plan Continue with current plan of care      Progression Toward Goals   Progression toward goals Progressing toward goals            SLP Education - 05/27/20 1255    Education Details compensations for memory, energy conservation    Person(s) Educated Patient    Methods Explanation    Comprehension Verbalized understanding            SLP Short Term Goals - 05/13/20 1453      SLP SHORT TERM GOAL #1   Title Patient will obtain a system for recall/executive function and bring to at least 3 therapy sessions.    Time 4    Period Weeks    Status New    Target Date 06/12/20      SLP SHORT TERM GOAL #2   Title Patient will report no forgotten items at the grocery store using compensations for recall.    Time 4    Period Weeks    Status New            SLP Long Term Goals - 05/13/20 1457      SLP LONG TERM GOAL #1   Title Patient will use external aids/strategies to run 2+ errands in a row.    Time 8    Period Weeks    Status New      SLP LONG TERM GOAL #2   Title Patient will report no missed medications or bills 4 visits in a row using memory compensations.    Time 8    Period Weeks    Status New      SLP LONG TERM GOAL #3   Title Patient will use compensations for attention/information processing to complete mod complex cognitive linguistic tasks >85% accuracy.    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 She is beginning to implement use of external aid for recall/executive function, requires cues for organization, including  appropriate information. Pt able to generate a strategy to assist with attention/recall when cooking.  I recommend skilled ST to train pt in compensations and strategies to improve her attention, recall and organization skills in order to improve function at work and home.    Speech Therapy Frequency 2x /  week    Duration 8 weeks    Treatment/Interventions Environmental controls;Cueing hierarchy;SLP instruction and feedback;Cognitive reorganization;Compensatory techniques;Functional tasks;Compensatory strategies;Internal/external aids;Patient/family education    Potential to Achieve Goals Good    Consulted and Agree with Plan of Care Patient           Patient will benefit from skilled therapeutic intervention in order to improve the following deficits and impairments:   Cognitive communication deficit  COVID-19 long hauler manifesting chronic concentration deficit    Problem List Patient Active Problem List   Diagnosis Date Noted  . Vitamin D deficiency 04/22/2020  . 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 History of COVID-19 02/28/2020  . 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 Memory loss 02/28/2020  . Physical deconditioning 02/28/2020  . Dyspnea on exertion 02/28/2020  . Neck pain, bilateral 02/28/2020  . Muscle spasms of neck 02/28/2020  . Gallstones 01/30/2020  . COVID-19 01/23/2020  . Arthralgia of hand 01/24/2019  . CRP elevated 01/24/2019  . ESR raised 01/24/2019  . Class 3 severe obesity due to excess calories without serious comorbidity in adult ([place]) 01/24/2019  . Iron deficiency anemia   . Palpitations 09/16/2018  . Coronary artery calcification 09/16/2018  . 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 Bilateral leg edema 04/20/2017  . Incidental lung nodule, > 76m and < 811m11/28/2018  . Hives 09/02/2015  . Morbid obesity ([facility]/13/2017  . Anxiety 07/09/2015  . 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 Thyroid nodule 02/01/2015  .  Vitamin D deficiency disease   . Vitamin B12 deficiency   . History of abnormal cervical Pap smear   . Allergy-induced asthma, mild intermittent, uncomplicated 0616/02/9603 Catherine LeverMSIselinCCC-SLP Speech-Language Pathologist  MaAliene Altes/24/2022, 12:59 PM  CoCarm[facility]-SeaAIN REPost Acute Specialty Hospital Of LafayetteERVICES [address]HunterNCAl[place]hone: [phone] Fax:  [phone] Name: TaALLY KNODELRN: [id number] of Birth: 9/14-Dec-1980

## 2020-05-28 ENCOUNTER — Encounter: Payer: Self-pay | Admitting: Family Medicine

## 2020-05-28 ENCOUNTER — Encounter: Payer: Self-pay | Admitting: Neurology

## 2020-05-28 ENCOUNTER — Ambulatory Visit (INDEPENDENT_AMBULATORY_CARE_PROVIDER_SITE_OTHER): Payer: Managed Care, Other (non HMO) | Admitting: Neurology

## 2020-05-28 VITALS — BP 137/92 | HR 77 | Ht 63.0 in | Wt 235.0 lb

## 2020-05-28 DIAGNOSIS — R430 Anosmia: Secondary | ICD-10-CM

## 2020-05-28 DIAGNOSIS — U099 Post covid-19 condition, unspecified: Secondary | ICD-10-CM

## 2020-05-28 DIAGNOSIS — R0601 Orthopnea: Secondary | ICD-10-CM | POA: Diagnosis not present

## 2020-05-28 DIAGNOSIS — R002 Palpitations: Secondary | ICD-10-CM

## 2020-05-28 DIAGNOSIS — R432 Parageusia: Secondary | ICD-10-CM | POA: Diagnosis not present

## 2020-05-28 DIAGNOSIS — R41840 Attention and concentration deficit: Secondary | ICD-10-CM

## 2020-05-28 NOTE — Progress Notes (Signed)
SLEEP MEDICINE CLINIC    Provider:  Melvyn Novasarmen  Darivs Lunden, MD  Primary Care Physician:  Alba CorySowles, Krichna, MD [address] [place] [id number]     Referring Provider: Ivonne Andrewichols, Tonya S, Np [address] [place] [id number]          Chief Complaint according to patient   Patient presents with:    . 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       HISTORY OF PRESENT ILLNESS:  Catherine Berg is a 40 y.o.  African American female patient and seen here upon referral on 05/28/2020 from Warner Mccreedyonya Nichols,NP  Chief concern according to patient : " I worked at [employer] when I contracted Covid 19, in September 2021." She was not vaccinated, and got severely ill.  She avoided hospitalization by being treated with steroids and oxygen , she didn't agree to monoclonal Ab treatments and she had SOB, and extreme myalgia, fatigue.   Her nephew and son ( [id number] and 41 years old ) ended up with COVID but recovered easier and completely.  She remains fatigued, with near syncope spells and loss of smell and taste and she is highly anxious.  She sleeps only with medication and still only 4-5 hours, waking up at 2-3 AM, she can't take naps. She feels hypervigilant and worried about health.  She is still using her pulse ox and she noted high heart rates intermittently , oxygen stays in the 90s.  Now unemployed.     She has a past medical history of Abnormal thyroid blood test, Anemia, Anxiety, Asthma, COVID 19 without pneumonia, buy ageusia and anosmnia, Complication of anesthesia, Elevated serum glutamic pyruvic transaminase (SGPT) level, Graves disease, History of abnormal cervical Pap smear, History of cervical polypectomy, Hives (09/02/2015), Hypertension, IFG (impaired fasting glucose), Incidental lung nodule, > 3mm and < 8mm (03/31/2017), Low serum vitamin D, Obesity, Pregnancy induced hypertension, Reflux, Thyroid disease, Vitamin B12 deficiency, Vitamin D deficiency disease,  and Wears dentures.     ROS:   Nocturia 3-4 times, snoring, sweating, palpitations, feeling SOB-non restorative sleep. Fatigue, myalgia arthralgia,   Tobacco use; none .  ETOH use; none,  Hair loss after COVID.  Caffeine intake in form of Coffee( 1 cup in AM ) Soda(1 a day ) Tea ( /) or energy drinks. Regular exercise - impossible , only walking. PT has concluded.        Sleep habits are as follows: The patient's dinner time is between 5-7 PM.  The patient goes to bed at 9 PM and continues to sleep for 3-6 hours, wakes for many bathroom breaks, the first time at 12 AM.   The preferred sleep position is seated - 8 pillows, supine , can't sleep prone anymore. Dreams are reportedly frequent/vivid.  Startled easily- palpitations, hypervigilant. AM is the usual rise time. Diaphoretic from "stress".  The patient wakes up spontaneously  She reports not feeling refreshed or restored in AM, with symptoms such as dry mouth , morning headaches, and residual fatigue.  Naps are taken infrequently.    Review of Systems: Out of a complete 14 system review, the patient complains of only the following symptoms, and all other reviewed systems are negative.:  Nocturia 3-4 times, snoring, sweating, palpitations, feeling SOB-non restorative sleep. Fatigue, myalgia arthralgia,  Fatigue, sleepiness , snoring, nocturia. ragmented sleep, Insomnia  Anxiety, hypervigilant, SOB, orthopnea.     Memory loss, focus, wordfinding, feeling foggy. She always  sleeps with TV on. Games on phone.    How likely are you to doze in the following situations: 0 = not likely, 1 = slight chance, 2 = moderate chance, 3 = high chance   Sitting and Reading? Watching Television? Sitting inactive in a public place (theater or meeting)? As a passenger in a car for an hour without a break? Lying down in the afternoon when circumstances permit? Sitting and talking to someone? Sitting quietly after lunch without alcohol? In a car,  while stopped for a few minutes in traffic?   Total = 13/ 24 points   FSS endorsed at 60/ 63 points.   Social History   Socioeconomic History  . Marital status: Single    Spouse name: Not on file  . Number of children: 2  . Years of education: Not on file  . Highest education level: High school graduate  Occupational History  . Not on file  Tobacco Use  . Smoking status: Never Smoker  . Smokeless tobacco: Never Used  Vaping Use  . Vaping Use: Never used  Substance and Sexual Activity  . Alcohol use: No    Alcohol/week: 0.0 standard drinks  . Drug use: No  . Sexual activity: Yes    Partners: Male    Birth control/protection: None  Other Topics Concern  . Not on file  Social History Narrative  . Not on file   Social Determinants of Health   Financial Resource Strain: Not on file  Food Insecurity: Not on file  Transportation Needs: Not on file  Physical Activity: Not on file  Stress: Not on file  Social Connections: Not on file    Family History  Problem Relation Age of Onset  . Heart attack Mother [id number]  . Hypertension Mother   . Asthma Mother   . Cancer Mother        laryngeal  . Diabetes Mother        pre diabetic  . Heart disease Mother   . Mental illness Mother   . Alcohol abuse Mother   . Drug abuse Mother   . Depression Mother   . Anxiety disorder Mother   . Bipolar disorder Mother   . Learning disabilities Brother   . ADD / ADHD Brother   . Asthma Son   . Hyperlipidemia Brother   . Alcohol abuse Father   . Heart disease Maternal Grandmother        heart attack, pacemaker  . Heart attack Maternal Grandmother   . Hypertension Maternal Grandmother   . Hypertension Maternal Grandfather   . Heart disease Paternal Grandmother        couple of major open heart surgeries, leaking valves  . Hypertension Paternal Grandmother   . Hypertension Paternal Grandfather   . Breast cancer Neg Hx     Past Medical History:  Diagnosis Date  . Abnormal thyroid  blood test   . Anemia   . Anxiety   . Asthma   . Complication of anesthesia    itching after c sections  . 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 Graves disease   . History of abnormal cervical Pap smear   . History of cervical polypectomy   . Hives 09/02/2015  . Hypertension   . IFG (impaired fasting glucose)   . Incidental lung nodule, > 3mm and < 8mm 03/31/2017   4 mm RLL lung nodule on chest CT Mar 31, 2017  . Low serum vitamin D   .  Obesity   . Pregnancy induced hypertension    with 1st pregnancy, normal pressure with 2nd  . Reflux   . Thyroid disease   . Vitamin B12 deficiency   . Vitamin D deficiency disease   . Wears dentures    full upper    Past Surgical History:  Procedure Laterality Date  . CERVICAL POLYPECTOMY    . CESAREAN SECTION     x 2  . COLONOSCOPY WITH PROPOFOL N/A 11/14/2018   Procedure: COLONOSCOPY WITH PROPOFOL;  Surgeon: Pasty Spillers, MD;  [facility];  Service: Endoscopy;  Laterality: N/A;  . ESOPHAGOGASTRODUODENOSCOPY (EGD) WITH PROPOFOL N/A 11/14/2018   Procedure: ESOPHAGOGASTRODUODENOSCOPY (EGD) WITH PROPOFOL;  Surgeon: Pasty Spillers, MD;  Location[facility];  Service: Endoscopy;  Laterality: N/A;  Latex allergy  . GIVENS CAPSULE STUDY N/A 12/27/2018   Procedure: GIVENS CAPSULE STUDY;  Surgeon: Pasty Spillers, MD;  Location: [facility] END[facility];  Service: Endoscopy;  Laterality: N/A;     Current Outpatient Medications on File Prior to Visit  Medication Sig Dispense Refill  . albuterol (PROVENTIL HFA;VENTOLIN HFA) 108 (90 Base) MCG/ACT inhaler Inhale 2 puffs into the lungs every 6 (six) hours as needed for wheezing or shortness of breath. 18 g 1  . amphetamine-dextroamphetamine (ADDERALL XR) 10 MG 24 hr capsule Take 1 capsule (10 mg total) by mouth daily. 30 capsule 0  . clobetasol (TEMOVATE) 0.05 % external solution Apply 1 application topically 2 (two) times daily. 50 mL 0  .  Cyanocobalamin (B-12) 1000 MCG SUBL Place 1 tablet under the tongue daily. 30 each 5  . DULoxetine (CYMBALTA) 60 MG capsule Take 1 capsule (60 mg total) by mouth daily. First week take one after that two daily 90 capsule 0  . Elastic Bandages & Supports (MEDICAL COMPRESSION STOCKINGS) MISC Wear from morning to night, but do not sleep in these 1 each 2  . Etonogestrel (NEXPLANON [place]) Inject into the skin.    . famotidine (PEPCID) 20 MG tablet Take 1 tablet (20 mg total) by mouth daily. 30 tablet 1  . fluticasone (FLONASE) 50 MCG/ACT nasal spray Place 2 sprays into both nostrils daily. 16 g 2  . hydrOXYzine (ATARAX/VISTARIL) 25 MG tablet Take 1 tablet (25 mg total) by mouth 3 (three) times daily as needed. 30 tablet 1  . ibuprofen (ADVIL) 800 MG tablet Take 1 tablet (800 mg total) by mouth every 8 (eight) hours as needed. 60 tablet 0  . levothyroxine (SYNTHROID) 50 MCG tablet Take 75 mcg by mouth daily.    . montelukast (SINGULAIR) 10 MG tablet Take 1 tablet (10 mg total) by mouth at bedtime. 90 tablet 1  . pregabalin (LYRICA) 75 MG capsule Take 1 capsule (75 mg total) by mouth 2 (two) times daily. 180 capsule 1  . tiZANidine (ZANAFLEX) 4 MG tablet Take 0.5-1.5 tablets (2-6 mg total) by mouth every 8 (eight) hours as needed for muscle spasms (muscle tightness). 90 tablet 2  . traZODone (DESYREL) 50 MG tablet Take 0.5-1 tablets (25-50 mg total) by mouth at bedtime as needed for sleep. 90 tablet 0  . Vitamin D, Ergocalciferol, (DRISDOL) 1.25 MG (50000 UT) CAPS capsule Take 1 capsule (50,000 Units total) by mouth every 7 (seven) days. 12 capsule 1   No current facility-administered medications on file prior to visit.    Allergies  Allergen Reactions  . Latex Hives    (Balloons, condoms, underwear elastic, gloves)  . Shellfish Allergy Hives    Physical exam:  Today's Vitals   05/28/20 1406  BP: (!) 137/92  Pulse: 77  Weight: 235 lb (106.6 kg)  Height: 5\' 3"  (1.6 m)   Body mass index is  41.63 kg/m.   Wt Readings from Last 3 Encounters:  05/28/20 235 lb (106.6 kg)  03/06/20 232 lb 8 oz (105.5 kg)  02/28/20 228 lb 0.1 oz (103.4 kg)     Ht Readings from Last 3 Encounters:  05/28/20 5\' 3"  (1.6 m)  03/06/20 5\' 3"  (1.6 m)  02/28/20 5\' 3"  (1.6 m)      General: The patient is awake, alert and appears not in acute distress. The patient is well groomed. Head: Normocephalic, atraumatic. Neck is supple. Mallampati 1 ,  Neck 15.5 inches.   Nasal airflow is patent. prognathia is seen.  Dental status: intact  Cardiovascular:  Regular rate and cardiac rhythm by pulse,  without distended neck veins. Respiratory: Lungs are clear to auscultation.  Skin:  Without evidence of ankle edema, or rash. Trunk: The patient's posture is erect.   Neurologic exam : The patient is awake and alert, oriented to place and time.   Memory subjective described as impaired.  Montreal Cognitive Assessment  05/28/2020  Visuospatial/ Executive (0/5) 5  Naming (0/3) 3  Attention: Read list of digits (0/2) 2  Attention: Read list of letters (0/1) 1  Attention: Serial 7 subtraction starting at 100 (0/3) 3  Language: Repeat phrase (0/2) 1  Language : Fluency (0/1) 1  Abstraction (0/2) 1  Delayed Recall (0/5) 0  Orientation (0/6) 6  Total 23     Attention span & concentration ability appears normal.  Speech is fluent,  without  dysarthria, dysphonia or aphasia.  Mood and affect are appropriate.   Cranial nerves: recent ( 01-2020)  loss of smell or taste reported .  Grapefruit- identified by scent - as CITRUS ,not by taste. Lavender : No identification.  Vanilla - yes, by scent.  Rum- not identified. Coconut; identified.  Peppermint - identified Clove - not identified Coffee - not identified.  Smoke- not identified.    Pupils are equal and briskly reactive to light. Funduscopic exam deferred.   Extraocular movements in vertical and horizontal planes were intact and without nystagmus. No  Diplopia. Visual fields by finger perimetry are intact. Hearing was intact to soft voice and finger rubbing.    Facial sensation intact to fine touch.  Facial motor strength is symmetric and tongue and uvula move midline.  Neck ROM : rotation, tilt and flexion extension were normal for age and shoulder shrug was symmetrical.    Motor exam:  Symmetric bulk, tone and ROM.   Normal tone without cog wheeling, symmetric grip strength .   Sensory:  Fine touch,and vibration were  normal.  Proprioception tested in the upper extremities was normal.  Coordination: Rapid alternating movements in the fingers/hands were of normal speed.  The Finger-to-nose maneuver was intact without evidence of ataxia, dysmetria or tremor.   Gait and station: Patient could rise unassisted from a seated position, walked without assistive device.  Stance is of normal width/ base Toe and heel walk were deferred.  Deep tendon reflexes: in the  upper and lower extremities are symmetric and intact.  Babinski response was deferred.       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 No need for tegretol. Will need to continue smell training, by association. Ageusia - can taste sweet salty and sour. No sensitivity for spices when Cooking.  Safety concern as she can't smell smoke.   2) orthopnea, SOB, nocturnal palpitations, all since COVID profound  fatigue and myalgia, exercise intolerance.  Had previous sleep testing 3 years ago, was negatve by HST.  She should have an attended sleep study to see heart rate and rhythm, correlate to palpitations, to diaphoresis, to choking sensations. Marland Kitchen   3)  Fatigue in General to be addressed with slow progression in daily exercise.  Walking , stretching, Yoga   4) memory loss- the short term memory accounted for 5 point loss on MOCA-    My Plan is to proceed with:  1) attended sleep study  2) anosmia rehab  3) anxiety treatment with CBT.   I would like to thank  Ivonne Andrew, Np [address] [place],  [place] [address] for allowing me to meet with and to take care of this pleasant patient.   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   I plan to follow up either personally or through our NP within 3-4 month.   CC: I will share my notes with PCP.   Electronically signed by: Melvyn Novas, MD 05/28/2020 2:06 PM  [facility] Board certified by The [employer] of Sleep Medicine and Diplomate of the [employer] of Sleep Medicine. Board certified In Neurology through the ABPN, Fellow of the [employer] of Neurology. Medical Director of [facility].

## 2020-05-28 NOTE — Patient Instructions (Signed)
Treatment of ANOSMIA and Ageusia: If the combination of smell and vision of the same object that you're smelling improves the recovery of smell, then just smell alone. So we call that bimodal, visual and olfactory stimulation. So the standard olfactory training is four scents: rose, lemon, cloves and eucalyptus.  And the subject sniffs twice a day, 10, 12, 20 seconds each time the four essential oils for anywhere to six, eight or 12 weeks.  And olfactory training for anosmia has been around for maybe 10, 15 years, studies suggest it works.  There's nothing absolutely definitive- but it can't hurt. It's thought to work by retraining the brain, the process of neuroplasticity, retraining the brain to smell these odors again. And we think that by smelling, you can retrain the brain. Now, one of the outstanding questions is, are you retraining the brain to smell rose, lemon, cloves and eucalyptus better, but forget about coffee, vanilla, coconut?  How generalizable is the improvement in smell? Rosanne Ashing, we asked the patients what smells would you like to pick and train on? The answer was fascinating to [place]. I thought it might be coffee as a coffee lover. I thought it might be vanilla as a vanilla-bean ice cream lover. It was smoke. The number one odor, smell, if you will, that people wanted to train on and to get better was smoke for the reasons that we talked about before.  The sense of smell is first and foremost attached to safety. And these people felt that if they could smell smoke better, they would feel more safe.  Unfortunately, there isn't a smoke essential oil so we couldn't do that.  But it just reinforced to [place] how important it is for patients to pick what smells they want to train on.  The other unique aspect that should be brought up is that half of the group are looking at high-quality pictures of the item, one of four items they picked, and each one of them have high-quality photographs presented on  their iPhone or iPad at the same time that they smell.  Loss of smell, also known as ANOSMIA, is one of the most common symptoms of COVID-19.   The [facility] Professor of Otolaryngology Loetta Rough, MD, and Assistant Professor Monika Salk, MD, have investigated several treatments for persistent anosmia (loss of smell), with a special interest in viral-related anosmia.     As the number of total, confirmed COVID-19 cases increases so does the number of people suffering from disease-related anosmia, making anosmia a significant public health problem.     Rehabilitation of anosmia :   Smell daily one sample of the following categories; and look at a picture of the object - f. Example at a picture of a lemon while smelling a lemon oil.   A floral scent  - such as rose, lavender, or vanilla - all would qualify for these.  A spice scent - nutmeg, anise, coffee, cinnamon-  A citrus smell - lemon, lime, orange  A menthol or minty scent, or eucalyptus.   Goal is to re-associate scent ( and eventually taste ) to the image.  The success is gradual, and this form of rehabilitation may take 12 month to succeed.   Another complication is PAROSMIA - smelling something that is not present, often an unpleasant smell of burning rubber or spoiled , foul smells.   This can be reduced by using a carbamazepine at 100 mg po bid. This antiepileptic medication slows the nerve conduction  and allows the reduction in abnormal smell sensation.    Melvyn Novas, MD  Memory Compensation Strategies  1. Use "WARM" strategy.  W= write it down  A= associate it  R= repeat it  M= make a mental note  2.   You can keep a Glass blower/designer.  Use a 3-ring notebook with sections for the following: calendar, important names and phone numbers,  medications, doctors' names/phone numbers, lists/reminders, and a section to journal what you did  each day.   3.    Use a calendar to write appointments  down.  4.    Write yourself a schedule for the day.  This can be placed on the calendar or in a separate section of the Memory Notebook.  Keeping a  regular schedule can help memory.  5.    Use medication organizer with sections for each day or morning/evening pills.  You may need help loading it  6.    Keep a basket, or pegboard by the door.  Place items that you need to take out with you in the basket or on the pegboard.  You may also want to  include a message board for reminders.  7.    Use sticky notes.  Place sticky notes with reminders in a place where the task is performed.  For example: " turn off the  stove" placed by the stove, "lock the door" placed on the door at eye level, " take your medications" on  the bathroom mirror or by the place where you normally take your medications.  8.    Use alarms/timers.  Use while cooking to remind yourself to check on food or as a reminder to take your medicine, or as a  reminder to make a call, or as a reminder to perform another task, etc.

## 2020-05-29 ENCOUNTER — Ambulatory Visit: Payer: Managed Care, Other (non HMO) | Admitting: Speech Pathology

## 2020-05-29 ENCOUNTER — Other Ambulatory Visit: Payer: Self-pay

## 2020-05-29 ENCOUNTER — Telehealth: Payer: Self-pay

## 2020-05-29 DIAGNOSIS — R41841 Cognitive communication deficit: Secondary | ICD-10-CM | POA: Diagnosis not present

## 2020-05-29 DIAGNOSIS — U099 Post covid-19 condition, unspecified: Secondary | ICD-10-CM

## 2020-05-29 DIAGNOSIS — R0601 Orthopnea: Secondary | ICD-10-CM

## 2020-05-29 DIAGNOSIS — R430 Anosmia: Secondary | ICD-10-CM

## 2020-05-29 NOTE — Telephone Encounter (Signed)
Order placed for HST 

## 2020-05-29 NOTE — Therapy (Signed)
[place] MA[place] [facility] SERVICES [address] [place], [place], [address] Phone: [phone]   Fax:  [phone]  Speech Language Pathology Treatment  Patient Details  Name: RAKISHA PINCOCK MRN: [id number] Date of Birth: [birth year] Referring Provider (SLP): Lazaro Arms, NP   Encounter Date: 05/29/2020   End of Session - 05/29/20 1250    Visit Number 4    Number of Visits 17    Date for SLP Re-Evaluation 07/12/20    Authorization Type [employer]    Authorization - Visit Number 4    Progress Note Due on Visit 10    SLP Start Time 1102    SLP Stop Time  1155    SLP Time Calculation (min) 53 min    Activity Tolerance Patient tolerated treatment well           Past Medical History:  Diagnosis Date  . Abnormal thyroid blood test   . Anemia   . Anxiety   . Asthma   . Complication of anesthesia    itching after c sections  . 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 Graves disease   . History of abnormal cervical Pap smear   . History of cervical polypectomy   . Hives 09/02/2015  . Hypertension   . IFG (impaired fasting glucose)   . Incidental lung nodule, > 53m and < 860m11/28/2018   4 mm RLL lung nodule on chest CT Mar 31, 2017  . Low serum vitamin D   . Obesity   . Pregnancy induced hypertension    with 1st pregnancy, normal pressure with 2nd  . Reflux   . Thyroid disease   . Vitamin B12 deficiency   . Vitamin D deficiency disease   . Wears dentures    full upper    Past Surgical History:  Procedure Laterality Date  . CERVICAL POLYPECTOMY    . CESAREAN SECTION     x 2  . COLONOSCOPY WITH PROPOFOL N/A 11/14/2018   Procedure: COLONOSCOPY WITH PROPOFOL;  Surgeon: TaVirgel ManifoldMD;  [place];  Laterality: N/A;  . ESOPHAGOGASTRODUODENOSCOPY (EGD) WITH PROPOFOL N/A 11/14/2018   Procedure: ESOPHAGOGASTRODUODENOSCOPY (EGD) WITH PROPOFOL;  Surgeon: TaVirgel ManifoldMD;  [place] [facility];  Laterality: N/A;  Latex allergy  . GIVENS CAPSULE STUDY N/A 12/27/2018   Procedure: GIVENS CAPSULE STUDY;  Surgeon: TaVirgel ManifoldMD;  Location: [facility] ENDOSCOPY;  Service: Endoscopy;  Laterality: N/A;    There were no vitals filed for this visit.   Subjective Assessment - 05/29/20 1216    Subjective "I have to do a sleep study."    Currently in Pain? No/denies                 ADULT SLP TREATMENT - 05/29/20 1229      General Information   Behavior/Cognition Alert;Cooperative    HPI Tracyann L. HaAldean Bakers a [age].o. 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 Past medical history is noted for anxiety, asthma, Graves disease, HTN.      Treatment Provided   Treatment provided Cognitive-Linquistic      Pain Assessment   Pain Assessment No/denies pain      Cognitive-Linquistic Treatment   Treatment focused on Cognition;Patient/family/caregiver education    Skilled Treatment Nothing new in planner today; patient reported she was exhausted with several errands/appointments this week. Pt generated  solution of setting a phone reminder to check her planner in the morning. Cues req to set reminder to preplan her day the night before. Discussed impact of poor sleep on cognitive function; education re: sleep hygiene as well as decluttering her sleeping space, setting a wind-down routine, mindfulness exercises. She reported being overwhelmed by tasks such as cleaning the house; SLP trained in breaking down larger tasks. Pt wrote list of chores she wants to complete, with mod cues to break down steps. Educated on energy conservation and setting reasonable goals/expectations for herself, using her planner to anticipate energy levels and schedule tasks.      Assessment / Recommendations / Plan   Plan Continue with current plan of care      Progression  Toward Goals   Progression toward goals Progressing toward goals            SLP Education - 05/29/20 1250    Education Details break down larger tasks    Person(s) Educated Patient    Methods Explanation    Comprehension Verbalized understanding;Returned demonstration;Verbal cues required;Need further instruction            SLP Short Term Goals - 05/13/20 1453      SLP SHORT TERM GOAL #1   Title Patient will obtain a system for recall/executive function and bring to at least 3 therapy sessions.    Time 4    Period Weeks    Status New    Target Date 06/12/20      SLP SHORT TERM GOAL #2   Title Patient will report no forgotten items at the grocery store using compensations for recall.    Time 4    Period Weeks    Status New            SLP Long Term Goals - 05/13/20 1457      SLP LONG TERM GOAL #1   Title Patient will use external aids/strategies to run 2+ errands in a row.    Time 8    Period Weeks    Status New      SLP LONG TERM GOAL #2   Title Patient will report no missed medications or bills 4 visits in a row using memory compensations.    Time 8    Period Weeks    Status New      SLP LONG TERM GOAL #3   Title Patient will use compensations for attention/information processing to complete mod complex cognitive linguistic tasks >85% accuracy.    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 Generated strategy today to help her remember to check/use her planner at home.  I recommend skilled ST to train pt in compensations and strategies to improve her attention, recall and organization skills in order to improve function at work and home.    Speech Therapy Frequency 2x / week    Duration 8 weeks    Treatment/Interventions Environmental controls;Cueing hierarchy;SLP instruction and  feedback;Cognitive reorganization;Compensatory techniques;Functional tasks;Compensatory strategies;Internal/external aids;Patient/family education    Potential to Achieve Goals Good    Consulted and Agree with Plan of Care Patient           Patient will benefit from skilled therapeutic intervention in order to improve the following deficits and impairments:   Cognitive communication  deficit  COVID-19 long hauler manifesting chronic concentration deficit    Problem List Patient Active Problem List   Diagnosis Date Noted  . Vitamin D deficiency 04/22/2020  . 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 History of COVID-19 02/28/2020  . 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 Memory loss 02/28/2020  . Physical deconditioning 02/28/2020  . Dyspnea on exertion 02/28/2020  . Neck pain, bilateral 02/28/2020  . Muscle spasms of neck 02/28/2020  . Gallstones 01/30/2020  . COVID-19 01/23/2020  . Arthralgia of hand 01/24/2019  . CRP elevated 01/24/2019  . ESR raised 01/24/2019  . Class 3 severe obesity due to excess calories without serious comorbidity in adult ([place]) 01/24/2019  . Iron deficiency anemia   . Palpitations 09/16/2018  . Coronary artery calcification 09/16/2018  . 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 Bilateral leg edema 04/20/2017  . Incidental lung nodule, > 52m and < 825m11/28/2018  . Hives 09/02/2015  . Morbid obesity ([place]CM[place]04/13/2017  . Anxiety 07/09/2015  . 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 Thyroid nodule 02/01/2015  . Vitamin D deficiency disease   . Vitamin B12 deficiency   . History of abnormal cervical Pap smear   . Allergy-induced asthma, mild intermittent, uncomplicated 0629/56/2130 MaDeneise LeverMSShoshoneCCC-SLP Speech-Language Pathologist  MaAliene Altes/26/2022, 12:52 PM  CoRipleyAIN RELegacy [facility] CenterERVICES [address] [place]hone: [phone] Fax:   [phone] Name: TaJAKYLA REZARN: [id number] of Birth: 01/1981/04/29

## 2020-05-29 NOTE — Telephone Encounter (Signed)
Catherine Berg will deny in lab sleep study due to it does not meet criteria. We will need to proceed with HST first.

## 2020-06-03 ENCOUNTER — Ambulatory Visit: Payer: Managed Care, Other (non HMO) | Admitting: Speech Pathology

## 2020-06-03 DIAGNOSIS — R41841 Cognitive communication deficit: Secondary | ICD-10-CM

## 2020-06-03 NOTE — Therapy (Signed)
[place] MAIN [facility] SERVICES [address] [place], [place], [address] Phone: [phone]   Fax:  [phone]  Speech Language Pathology Treatment  Patient Details  Name: Catherine Berg MRN: [id number] Date of Birth: 1980/05/22 Referring Provider (SLP): Lazaro Arms, NP   Encounter Date: 06/03/2020   End of Session - 06/03/20 1412    Visit Number 5    Number of Visits 17    Date for SLP Re-Evaluation 07/12/20    Authorization Type [employer]    Authorization - Visit Number 5    Progress Note Due on Visit 10    SLP Start Time 1104    SLP Stop Time  1155    SLP Time Calculation (min) 51 min    Activity Tolerance Patient tolerated treatment well           Past Medical History:  Diagnosis Date  . Abnormal thyroid blood test   . Anemia   . Anxiety   . Asthma   . Complication of anesthesia    itching after c sections  . 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 Graves disease   . History of abnormal cervical Pap smear   . History of cervical polypectomy   . Hives 09/02/2015  . Hypertension   . IFG (impaired fasting glucose)   . Incidental lung nodule, > 81mm and < 76mm 03/31/2017   4 mm RLL lung nodule on chest CT Mar 31, 2017  . Low serum vitamin D   . Obesity   . Pregnancy induced hypertension    with 1st pregnancy, normal pressure with 2nd  . Reflux   . Thyroid disease   . Vitamin B12 deficiency   . Vitamin D deficiency disease   . Wears dentures    full upper    Past Surgical History:  Procedure Laterality Date  . CERVICAL POLYPECTOMY    . CESAREAN SECTION     x 2  . COLONOSCOPY WITH PROPOFOL N/A 11/14/2018   Procedure: COLONOSCOPY WITH PROPOFOL;  Surgeon: Virgel Manifold, MD;  Location: [place];  Service: Endoscopy;  Laterality: N/A;  . ESOPHAGOGASTRODUODENOSCOPY (EGD) WITH PROPOFOL N/A 11/14/2018   Procedure: ESOPHAGOGASTRODUODENOSCOPY (EGD) WITH PROPOFOL;  Surgeon: Virgel Manifold, MD;  Location: [place];  Service: Endoscopy;  Laterality: N/A;  Latex allergy  . GIVENS CAPSULE STUDY N/A 12/27/2018   Procedure: GIVENS CAPSULE STUDY;  Surgeon: Virgel Manifold, MD;  Location: [facility] ENDOSCOPY;  Service: Endoscopy;  Laterality: N/A;    There were no vitals filed for this visit.   Subjective Assessment - 06/03/20 1407    Subjective "I got overwhelmed. I had to get my son to help me."    Currently in Pain? No/denies                 ADULT SLP TREATMENT - 06/03/20 1407      General Information   Behavior/Cognition Alert;Cooperative    HPI Catherine Berg is a 40 y.o. 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 Past medical history is noted for anxiety, asthma, Graves disease, HTN.      Treatment Provided   Treatment provided Cognitive-Linquistic      Pain Assessment   Pain Assessment No/denies pain      Cognitive-Linquistic Treatment   Treatment focused on Cognition;Patient/family/caregiver education    Skilled Treatment Patient reported she got overwhelmed while cooking Sunday lunch, could not multitask.  She had trouble sequencing tasks and her son had to take over. Pt expressed frustration because she used to be able to do this effortlessly. SLP used functional task of sequencing meal preparation steps to target pt's executive function skills and train compensations to help her complete tasks at home. With mod cues, pt created a timeline, working in reverse order to determine steps/timing for 4 dishes to have ready at once. Cues for awareness of errors (eg, heating oil for 30 minutes, forgetting to preheat oven). Pt then used her timeline to create a sequence of steps, with occasional mod cues. Homework is to prepare similar timeline/sequence for next Sunday's meal. Patient had not updated weekly section of her planner, but when questioned about this referenced her monthly  schedule and added to-dos for Tuesday and Wednesday.      Assessment / Recommendations / Plan   Plan Continue with current plan of care      Progression Toward Goals   Progression toward goals Progressing toward goals            SLP Education - 06/03/20 1412    Education Details use checklists to help with cooking    Person(s) Educated Patient    Methods Explanation    Comprehension Verbalized understanding;Verbal cues required;Need further instruction            SLP Short Term Goals - 05/13/20 1453      SLP SHORT TERM GOAL #1   Title Patient will obtain a system for recall/executive function and bring to at least 3 therapy sessions.    Time 4    Period Weeks    Status New    Target Date 06/12/20      SLP SHORT TERM GOAL #2   Title Patient will report no forgotten items at the grocery store using compensations for recall.    Time 4    Period Weeks    Status New            SLP Long Term Goals - 05/13/20 1457      SLP LONG TERM GOAL #1   Title Patient will use external aids/strategies to run 2+ errands in a row.    Time 8    Period Weeks    Status New      SLP LONG TERM GOAL #2   Title Patient will report no missed medications or bills 4 visits in a row using memory compensations.    Time 8    Period Weeks    Status New      SLP LONG TERM GOAL #3   Title Patient will use compensations for attention/information processing to complete mod complex cognitive linguistic tasks >85% accuracy.    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 She requires cues to remain consistent with planner use. Written checklist and timeline was helpful for pt in sequencing steps for meal preparation. I recommend skilled ST to train pt in compensations and strategies to improve her attention, recall and  organization skills in order to improve function at work and home.    Speech Therapy Frequency 2x / week    Duration 8 weeks    Treatment/Interventions Environmental controls;Cueing hierarchy;SLP instruction and feedback;Cognitive reorganization;Compensatory techniques;Functional tasks;Compensatory strategies;Internal/external aids;Patient/family education    Potential to Achieve  Goals Good    Consulted and Agree with Plan of Care Patient           Patient will benefit from skilled therapeutic intervention in order to improve the following deficits and impairments:   Cognitive communication deficit    Problem List Patient Active Problem List   Diagnosis Date Noted  . Vitamin D deficiency 04/22/2020  . 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 History of COVID-19 02/28/2020  . 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 Memory loss 02/28/2020  . Physical deconditioning 02/28/2020  . Dyspnea on exertion 02/28/2020  . Neck pain, bilateral 02/28/2020  . Muscle spasms of neck 02/28/2020  . Gallstones 01/30/2020  . COVID-19 01/23/2020  . Arthralgia of hand 01/24/2019  . CRP elevated 01/24/2019  . ESR raised 01/24/2019  . Class 3 severe obesity due to excess calories without serious comorbidity in adult ([place]) 01/24/2019  . Iron deficiency anemia   . Palpitations 09/16/2018  . Coronary artery calcification 09/16/2018  . 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 Bilateral leg edema 04/20/2017  . Incidental lung nodule, > 54m and < 864m11/28/2018  . Hives 09/02/2015  . Morbid obesity (HCMan[place]04/13/2017  . Anxiety 07/09/2015  . 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 Thyroid nodule 02/01/2015  . Vitamin D deficiency disease   . Vitamin B12 deficiency   . History of abnormal cervical Pap smear   . Allergy-induced asthma, mild intermittent, uncomplicated 0662/19/4712 MaDeneise LeverMSSterlingCCC-SLP Speech-Language Pathologist  MaAliene Altes/31/2022, 2:15  PM  CoBeasleyAIN RESt. David'S South Austin Medical CenterERVICES [address]dMattesonNC[place]hone: [phone] Fax:  [phone] Name: TaHAYLEIGH BAWARN: [id number] of Birth: 9/July 02, 1980

## 2020-06-05 ENCOUNTER — Ambulatory Visit: Payer: Managed Care, Other (non HMO) | Attending: Nurse Practitioner | Admitting: Speech Pathology

## 2020-06-05 ENCOUNTER — Other Ambulatory Visit: Payer: Self-pay

## 2020-06-05 DIAGNOSIS — R41841 Cognitive communication deficit: Secondary | ICD-10-CM | POA: Diagnosis not present

## 2020-06-05 DIAGNOSIS — U099 Post covid-19 condition, unspecified: Secondary | ICD-10-CM | POA: Insufficient documentation

## 2020-06-05 DIAGNOSIS — R41840 Attention and concentration deficit: Secondary | ICD-10-CM | POA: Diagnosis present

## 2020-06-05 NOTE — Therapy (Signed)
[place] MAIN [facility] SERVICES [address] [place], [place], [address] Phone: [phone]   Fax:  [phone]  Speech Language Pathology Treatment  Patient Details  Name: Catherine Berg MRN: [id number] Date of Birth: 04-Jul-1980 Referring Provider (SLP): Lazaro Arms, NP   Encounter Date: 06/05/2020   End of Session - 06/05/20 1210    Visit Number 6    Number of Visits 17    Date for SLP Re-Evaluation 07/12/20    Authorization Type [employer]    Authorization - Visit Number 6    Progress Note Due on Visit 10    SLP Start Time 1103    SLP Stop Time  1150    SLP Time Calculation (min) 47 min    Activity Tolerance Patient tolerated treatment well           Past Medical History:  Diagnosis Date  . Abnormal thyroid blood test   . Anemia   . Anxiety   . Asthma   . Complication of anesthesia    itching after c sections  . 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 Graves disease   . History of abnormal cervical Pap smear   . History of cervical polypectomy   . Hives 09/02/2015  . Hypertension   . IFG (impaired fasting glucose)   . Incidental lung nodule, > 68m and < 873m11/28/2018   4 mm RLL lung nodule on chest CT Mar 31, 2017  . Low serum vitamin D   . Obesity   . Pregnancy induced hypertension    with 1st pregnancy, normal pressure with 2nd  . Reflux   . Thyroid disease   . Vitamin B12 deficiency   . Vitamin D deficiency disease   . Wears dentures    full upper    Past Surgical History:  Procedure Laterality Date  . CERVICAL POLYPECTOMY    . CESAREAN SECTION     x 2  . COLONOSCOPY WITH PROPOFOL N/A 11/14/2018   Procedure: COLONOSCOPY WITH PROPOFOL;  Surgeon: TaVirgel ManifoldMD;  [place]: [place];  Laterality: N/A;  . ESOPHAGOGASTRODUODENOSCOPY (EGD) WITH PROPOFOL N/A 11/14/2018   Procedure: ESOPHAGOGASTRODUODENOSCOPY (EGD) WITH PROPOFOL;  Surgeon: TaVirgel ManifoldMD;  [place]: [place];  Laterality: N/A;  Latex allergy  . GIVENS CAPSULE STUDY N/A 12/27/2018   Procedure: GIVENS CAPSULE STUDY;  Surgeon: TaVirgel ManifoldMD;  Location: [facility] ENDOS[facility];  Service: Endoscopy;  Laterality: N/A;    There were no vitals filed for this visit.   Subjective Assessment - 06/05/20 1205    Subjective "I'm tired today."    Currently in Pain? No/denies                 ADULT SLP TREATMENT - 06/05/20 1205      General Information   Behavior/Cognition Alert;Cooperative;Pleasant mood    HPI Catherine Berg a [age].o. 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 Past medical history is noted for anxiety, asthma, Graves disease, HTN.      Treatment Provided   Treatment provided Cognitive-Linquistic      Pain Assessment   Pain Assessment No/denies pain      Cognitive-Linquistic Treatment   Treatment focused on Cognition;Patient/family/caregiver education    Skilled Treatment Patient seen for skilled ST targeting cognition, focused session on executive function and compensations for attention. Pt generated timeline for  Sunday meal prep; sequenced steps today with rare min cues. Discussed using compensations such as written weekly meal plan, written checklists when cooking to assist with multitasking and sequencing. Patient generated meal plan for remainder of the week using template provided. Pt to place on fridge, update with grocery items necessary for taco dinner on Friday. Throughout session as pt thought about her week, she paused to update her weekly planner without cues. Has upcoming MD appointment; pt to generate list for her appointment of potential accommodations she might need for eventual return to work.      Assessment / Recommendations / Plan   Plan Continue with current plan of care      Progression Toward Goals   Progression  toward goals Progressing toward goals            SLP Education - 06/05/20 1210    Education Details weekly meal plan    Person(s) Educated Patient    Methods Explanation    Comprehension Verbalized understanding            SLP Short Term Goals - 05/13/20 1453      SLP SHORT TERM GOAL #1   Title Patient will obtain a system for recall/executive function and bring to at least 3 therapy sessions.    Time 4    Period Weeks    Status New    Target Date 06/12/20      SLP SHORT TERM GOAL #2   Title Patient will report no forgotten items at the grocery store using compensations for recall.    Time 4    Period Weeks    Status New            SLP Long Term Goals - 05/13/20 1457      SLP LONG TERM GOAL #1   Title Patient will use external aids/strategies to run 2+ errands in a row.    Time 8    Period Weeks    Status New      SLP LONG TERM GOAL #2   Title Patient will report no missed medications or bills 4 visits in a row using memory compensations.    Time 8    Period Weeks    Status New      SLP LONG TERM GOAL #3   Title Patient will use compensations for attention/information processing to complete mod complex cognitive linguistic tasks >85% accuracy.    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 Improved carryover with use of planner; pt initiating use spontaneously as needed during session today. Written checklist and timeline remains helpful for pt in sequencing steps for meal preparation. I recommend skilled ST to train pt in compensations and strategies to improve her attention, recall and organization skills in order to improve function at work and home.    Speech Therapy Frequency 2x / week    Duration 8 weeks    Treatment/Interventions Environmental controls;Cueing hierarchy;SLP  instruction and feedback;Cognitive reorganization;Compensatory techniques;Functional tasks;Compensatory strategies;Internal/external aids;Patient/family education    Potential to Achieve Goals Good    Consulted and Agree with Plan of Care Patient           Patient will benefit from skilled therapeutic intervention in order to improve the following deficits and impairments:   Cognitive communication  deficit  COVID-19 long hauler manifesting chronic concentration deficit    Problem List Patient Active Problem List   Diagnosis Date Noted  . Vitamin D deficiency 04/22/2020  . 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 History of COVID-19 02/28/2020  . 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 Memory loss 02/28/2020  . Physical deconditioning 02/28/2020  . Dyspnea on exertion 02/28/2020  . Neck pain, bilateral 02/28/2020  . Muscle spasms of neck 02/28/2020  . Gallstones 01/30/2020  . COVID-19 01/23/2020  . Arthralgia of hand 01/24/2019  . CRP elevated 01/24/2019  . ESR raised 01/24/2019  . Class 3 severe obesity due to excess calories without serious comorbidity in adult ([place]) 01/24/2019  . Iron deficiency anemia   . Palpitations 09/16/2018  . Coronary artery calcification 09/16/2018  . 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 Bilateral leg edema 04/20/2017  . Incidental lung nodule, > 58m and < 862m11/28/2018  . Hives 09/02/2015  . Morbid obesity (HCDewey04/13/2017  . Anxiety 07/09/2015  . 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 Thyroid nodule 02/01/2015  . Vitamin D deficiency disease   . Vitamin B12 deficiency   . History of abnormal cervical Pap smear   . Allergy-induced asthma, mild intermittent, uncomplicated 0649/96/9249 MaDeneise LeverMSHowardCCC-SLP Speech-Language Pathologist  Catherine Berg/06/2020, 12:13 PM  CoJane LewAIN REDesoto Eye Surgery Center LLCERVICES [address]dMount ReposeNC[place]hone: [phone]  Fax:  [phone] Name: Catherine DEMURORN: [id number] of Birth: 9/Mar 20, 1981

## 2020-06-07 NOTE — Progress Notes (Signed)
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  She continues to have fatigue, worried about memory loss ( seeing neurologist - Dr. Brett Fairy ), continues to have lack of taste and smell. She was seen by COVID-19 clinic end of October and was referred to PT, [place] and also neurologist. Dr. Brett Fairy referred her for a home sleep study because pulse ox seems to be dropping during the night. She has a history of asthma and used to see Dr. Felicie Morn.  She noticed fatigue, dizziness and tachycardia  on 04/30/2020, her COVID-19 test was negative, but daughter positive, she is states still has dizzy episodes a couple of times a month. Not orthostatic changes, can happen while sitting  She has not been back to work yet, she is a Librarian, academic for [employer], FMLA filled out last visit to go back to work March 1 st, 2022, advised to discuss with supervisor the possibility of going back with reduced hours  She did not have COVID-19 vaccines.  Explained she should get vaccinated  She had flu vaccine.   MDD/ Anxiety : she is now taking Duloxetine 60 mg, she is taking hydroxyzine prn to help with anxiety and trazodone 50 mg every night to help her fall and stay asleep. She takes Adderal for energy and focus. Discussed referral to psychiatrist . She has a history of severe depression in the past after her mother died, it lasted for over one year. She saw psychiatrist at the time, she feels like she is in the same head space now, lack of motivation, scared that something bad will happen, crying spells .    Patient Active Problem List   Diagnosis Date Noted  . Vitamin D deficiency 04/22/2020  .  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 History of COVID-19 02/28/2020  . 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 Memory loss 02/28/2020  . Physical deconditioning 02/28/2020  . Dyspnea on exertion 02/28/2020  . Neck pain, bilateral 02/28/2020  . Muscle spasms of neck 02/28/2020  . Gallstones 01/30/2020  . COVID-19 01/23/2020  . Arthralgia of hand 01/24/2019  . CRP elevated 01/24/2019  . ESR raised 01/24/2019  . Class 3 severe obesity due to excess calories without serious comorbidity in adult ([place]) 01/24/2019  . Iron deficiency anemia   . Palpitations 09/16/2018  . Coronary artery calcification 09/16/2018  . 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 Bilateral leg edema 04/20/2017  . Incidental lung nodule, > 69m and < 883m11/28/2018  . Hives 09/02/2015  . Morbid obesity ([facility]04/13/2017  . Anxiety 07/09/2015  . 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 Thyroid nodule 02/01/2015  . Vitamin D deficiency disease   . Vitamin B12 deficiency   . History of abnormal cervical Pap smear   . Allergy-induced asthma, mild intermittent, uncomplicated [id number]  Past Surgical History:  Procedure Laterality Date  . CERVICAL POLYPECTOMY    . CESAREAN SECTION     x 2  . COLONOSCOPY WITH PROPOFOL N/A 11/14/2018   Procedure: COLONOSCOPY WITH PROPOFOL;  Surgeon: TaVirgel ManifoldMD;  [facility]: [facility];  Laterality: N/A;  .  ESOPHAGOGASTRODUODENOSCOPY (EGD) WITH PROPOFOL N/A 11/14/2018   Procedure: ESOPHAGOGASTRODUODENOSCOPY (EGD) WITH PROPOFOL;  Surgeon: Virgel Manifold, MD;  Location: [place];  Service: Endoscopy;  Laterality: N/A;  Latex allergy  . GIVENS CAPSULE STUDY N/A 12/27/2018   Procedure: GIVENS CAPSULE STUDY;  Surgeon: Virgel Manifold, MD;  Location: [facility] ENDOSCOPY;  Service: Endoscopy;  Laterality: N/A;    Family History  Problem Relation Age of Onset  . Heart attack Mother 24  . Hypertension Mother    . Asthma Mother   . Cancer Mother        laryngeal  . Diabetes Mother        pre diabetic  . Heart disease Mother   . Mental illness Mother   . Alcohol abuse Mother   . Drug abuse Mother   . Depression Mother   . Anxiety disorder Mother   . Bipolar disorder Mother   . Learning disabilities Brother   . ADD / ADHD Brother   . Asthma Son   . Hyperlipidemia Brother   . Alcohol abuse Father   . Heart disease Maternal Grandmother        heart attack, pacemaker  . Heart attack Maternal Grandmother   . Hypertension Maternal Grandmother   . Hypertension Maternal Grandfather   . Heart disease Paternal Grandmother        couple of major open heart surgeries, leaking valves  . Hypertension Paternal Grandmother   . Hypertension Paternal Grandfather   . Breast cancer Neg Hx     Social History   Tobacco Use  . Smoking status: Never Smoker  . Smokeless tobacco: Never Used  Substance Use Topics  . Alcohol use: No    Alcohol/week: 0.0 standard drinks     Current Outpatient Medications:  .  albuterol (PROVENTIL HFA;VENTOLIN HFA) 108 (90 Base) MCG/ACT inhaler, Inhale 2 puffs into the lungs every 6 (six) hours as needed for wheezing or shortness of breath., Disp: 18 g, Rfl: 1 .  amphetamine-dextroamphetamine (ADDERALL XR) 10 MG 24 hr capsule, Take 1 capsule (10 mg total) by mouth daily., Disp: 30 capsule, Rfl: 0 .  clobetasol (TEMOVATE) 0.05 % external solution, Apply 1 application topically 2 (two) times daily., Disp: 50 mL, Rfl: 0 .  Cyanocobalamin (B-12) 1000 MCG SUBL, Place 1 tablet under the tongue daily., Disp: 30 each, Rfl: 5 .  DULoxetine (CYMBALTA) 60 MG capsule, Take 1 capsule (60 mg total) by mouth daily. First week take one after that two daily, Disp: 90 capsule, Rfl: 0 .  Elastic Bandages & Supports ([place]) MISC, Wear from morning to night, but do not sleep in these, Disp: 1 each, Rfl: 2 .  Etonogestrel (NEXPLANON [place]), Inject into the skin., Disp: ,  Rfl:  .  famotidine (PEPCID) 20 MG tablet, Take 1 tablet (20 mg total) by mouth daily., Disp: 30 tablet, Rfl: 1 .  fluticasone (FLONASE) 50 MCG/ACT nasal spray, Place 2 sprays into both nostrils daily., Disp: 16 g, Rfl: 2 .  hydrOXYzine (ATARAX/VISTARIL) 25 MG tablet, Take 1 tablet (25 mg total) by mouth 3 (three) times daily as needed., Disp: 30 tablet, Rfl: 1 .  levothyroxine (SYNTHROID) 50 MCG tablet, Take 75 mcg by mouth daily., Disp: , Rfl:  .  montelukast (SINGULAIR) 10 MG tablet, Take 1 tablet (10 mg total) by mouth at bedtime., Disp: 90 tablet, Rfl: 1 .  pregabalin (LYRICA) 75 MG capsule, Take 1 capsule (75 mg total) by mouth 2 (two) times daily., Disp: 180 capsule,  Rfl: 1 .  tiZANidine (ZANAFLEX) 4 MG tablet, Take 0.5-1.5 tablets (2-6 mg total) by mouth every 8 (eight) hours as needed for muscle spasms (muscle tightness)., Disp: 90 tablet, Rfl: 2 .  traZODone (DESYREL) 50 MG tablet, Take 0.5-1 tablets (25-50 mg total) by mouth at bedtime as needed for sleep., Disp: 90 tablet, Rfl: 0 .  Vitamin D, Ergocalciferol, (DRISDOL) 1.25 MG (50000 UT) CAPS capsule, Take 1 capsule (50,000 Units total) by mouth every 7 (seven) days., Disp: 12 capsule, Rfl: 1  Allergies  Allergen Reactions  . Latex Hives    (Balloons, condoms, underwear elastic, gloves)  . [place] Allergy Hives    I personally reviewed active problem list, medication list, allergies, family history, social history, health maintenance with the patient/caregiver today.   ROS  Constitutional: Negative for fever or weight change.  Respiratory: Negative for cough and shortness of breath.   Cardiovascular: Negative for chest pain or palpitations.  Gastrointestinal: Negative for abdominal pain, no bowel changes.  Musculoskeletal: Negative for gait problem or joint swelling.  Skin: Negative for rash.  Neurological: positive for dizziness she also has intermittent headache.  No other specific complaints in a complete review of  systems (except as listed in HPI above).  Objective  Vitals:   06/10/20 1341  BP: 134/78  Pulse: 80  Resp: 16  Temp: 98.5 F (36.9 C)  TempSrc: Oral  SpO2: 99%  Weight: 236 lb 3.2 oz (107.1 kg)  Height: '5\' 3"'  (1.6 m)    Body mass index is 41.84 kg/m.  Physical Exam  Constitutional: Patient appears well-developed and well-nourished. Obese  No distress.  HEENT: head atraumatic, normocephalic, pupils equal and reactive to light,  neck supple Cardiovascular: Normal rate, regular rhythm and normal heart sounds.  No murmur heard. No BLE edema. Pulmonary/Chest: Effort normal and breath sounds normal. No respiratory distress. Abdominal: Soft.  There is no tenderness. Psychiatric: Patient has a normal mood and affect. behavior is normal. Judgment and thought content normal.  PHQ2/9: Depression screen [facility] 2/9 06/10/2020 05/06/2020 03/06/2020 01/30/2020 01/23/2020  Decreased Interest '2 1 2 1 ' 0  Down, Depressed, Hopeless '1 1 2 1 ' 0  PHQ - 2 Score '3 2 4 2 ' 0  Altered sleeping '1 1 2 ' - -  Tired, decreased energy '2 3 3 ' - -  Change in appetite '3 2 1 ' - -  Feeling bad or failure about yourself  '1 1 1 ' - -  Trouble concentrating 0 0 0 - -  Moving slowly or fidgety/restless 0 0 0 - -  Suicidal thoughts 0 0 0 - -  PHQ-9 Score '10 9 11 ' - -  Difficult doing work/chores Very difficult - Somewhat difficult - -  Some recent data might be hidden    phq 9 is positive   Fall Risk: Fall Risk  06/10/2020 05/06/2020 03/06/2020 02/28/2020 01/30/2020  Falls in the past year? 1 1 0 0 0  Number falls in past yr: 0 0 0 0 0  Injury with Fall? 0 0 0 0 0  Comment - - - - -  Follow up - - - - -     Functional Status Survey: Is the patient deaf or have difficulty hearing?: No Does the patient have difficulty seeing, even when wearing glasses/contacts?: No Does the patient have difficulty concentrating, remembering, or making decisions?: Yes Does the patient have difficulty walking or climbing stairs?: No Does the  patient have difficulty dressing or bathing?: No Does the patient have difficulty doing errands  alone such as visiting a doctor's office or shopping?: No    Assessment & Plan   1. Moderate recurrent major depression ([place])  - Ambulatory referral to Psychiatry - amphetamine-dextroamphetamine (ADDERALL XR) 15 MG 24 hr capsule; Take 1 capsule by mouth daily.  Dispense: 30 capsule; Refill: 0  2. Insomnia, psychophysiological  - Ambulatory referral to Psychiatry - pregabalin (LYRICA) 75 MG capsule; Take 1 capsule (75 mg total) by mouth at bedtime as needed.  Dispense: 90 capsule; Refill: 0  3. Nocturnal hypoxemia  Placing referral to pulmonologist  4. Mild intermittent asthma without complication  - Ambulatory referral to Pulmonology - montelukast (SINGULAIR) 10 MG tablet; Take 1 tablet (10 mg total) by mouth at bedtime.  Dispense: 90 tablet; Refill: 1  5. COVID-19 long hauler  - Ambulatory referral to Pulmonology - amphetamine-dextroamphetamine (ADDERALL XR) 15 MG 24 hr capsule; Take 1 capsule by mouth daily.  Dispense: 30 capsule; Refill: 0  6. Perennial allergic rhinitis with seasonal variation  - montelukast (SINGULAIR) 10 MG tablet; Take 1 tablet (10 mg total) by mouth at bedtime.  Dispense: 90 tablet; Refill: 1  7. Neuropathy involving both lower extremities  - pregabalin (LYRICA) 75 MG capsule; Take 1 capsule (75 mg total) by mouth at bedtime as needed.  Dispense: 90 capsule; Refill: 0

## 2020-06-10 ENCOUNTER — Ambulatory Visit (INDEPENDENT_AMBULATORY_CARE_PROVIDER_SITE_OTHER): Payer: Managed Care, Other (non HMO) | Admitting: Family Medicine

## 2020-06-10 ENCOUNTER — Encounter: Payer: Self-pay | Admitting: Family Medicine

## 2020-06-10 ENCOUNTER — Other Ambulatory Visit: Payer: Self-pay

## 2020-06-10 VITALS — BP 134/78 | HR 80 | Temp 98.5°F | Resp 16 | Ht 63.0 in | Wt 236.2 lb

## 2020-06-10 DIAGNOSIS — F5104 Psychophysiologic insomnia: Secondary | ICD-10-CM | POA: Diagnosis not present

## 2020-06-10 DIAGNOSIS — J302 Other seasonal allergic rhinitis: Secondary | ICD-10-CM

## 2020-06-10 DIAGNOSIS — J4520 Mild intermittent asthma, uncomplicated: Secondary | ICD-10-CM

## 2020-06-10 DIAGNOSIS — F331 Major depressive disorder, recurrent, moderate: Secondary | ICD-10-CM | POA: Diagnosis not present

## 2020-06-10 DIAGNOSIS — G4734 Idiopathic sleep related nonobstructive alveolar hypoventilation: Secondary | ICD-10-CM

## 2020-06-10 DIAGNOSIS — G5793 Unspecified mononeuropathy of bilateral lower limbs: Secondary | ICD-10-CM

## 2020-06-10 DIAGNOSIS — U099 Post covid-19 condition, unspecified: Secondary | ICD-10-CM

## 2020-06-10 DIAGNOSIS — J3089 Other allergic rhinitis: Secondary | ICD-10-CM

## 2020-06-10 MED ORDER — AMPHETAMINE-DEXTROAMPHET ER 15 MG PO CP24: 15.0000 mg | ORAL_CAPSULE | Freq: Every day | ORAL | 0 refills | Status: DC

## 2020-06-10 MED ORDER — PREGABALIN 75 MG PO CAPS: 75.0000 mg | ORAL_CAPSULE | Freq: Every evening | ORAL | 0 refills | Status: DC | PRN

## 2020-06-10 MED ORDER — MONTELUKAST SODIUM 10 MG PO TABS: 10.0000 mg | ORAL_TABLET | Freq: Every day | ORAL | 1 refills | Status: DC

## 2020-06-12 ENCOUNTER — Ambulatory Visit: Payer: Managed Care, Other (non HMO) | Admitting: Speech Pathology

## 2020-06-12 ENCOUNTER — Other Ambulatory Visit: Payer: Self-pay

## 2020-06-12 DIAGNOSIS — R41841 Cognitive communication deficit: Secondary | ICD-10-CM | POA: Diagnosis not present

## 2020-06-12 DIAGNOSIS — R41840 Attention and concentration deficit: Secondary | ICD-10-CM

## 2020-06-12 DIAGNOSIS — U099 Post covid-19 condition, unspecified: Secondary | ICD-10-CM

## 2020-06-12 NOTE — Therapy (Signed)
[place] MAIN [facility] SERVICES [address] [place], [place], [address] Phone: [phone]   Fax:  [phone]  Speech Language Pathology Treatment  Patient Details  Name: Catherine Berg MRN: [id number] Date of Birth: December 26, 1980 Referring Provider (SLP): Lazaro Arms, NP   Encounter Date: 06/12/2020   End of Session - 06/12/20 1220    Visit Number 7    Number of Visits 17    Date for SLP Re-Evaluation 07/12/20    Authorization Type [employer]    Authorization - Visit Number 7    Progress Note Due on Visit 10    SLP Start Time 1100    SLP Stop Time  1150    SLP Time Calculation (min) 50 min    Activity Tolerance Patient tolerated treatment well           Past Medical History:  Diagnosis Date  . Abnormal thyroid blood test   . Anemia   . Anxiety   . Asthma   . Complication of anesthesia    itching after c sections  . 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 Graves disease   . History of abnormal cervical Pap smear   . History of cervical polypectomy   . Hives 09/02/2015  . Hypertension   . IFG (impaired fasting glucose)   . Incidental lung nodule, > 24m and < 870m11/28/2018   4 mm RLL lung nodule on chest CT Mar 31, 2017  . Low serum vitamin D   . Obesity   . Pregnancy induced hypertension    with 1st pregnancy, normal pressure with 2nd  . Reflux   . Thyroid disease   . Vitamin B12 deficiency   . Vitamin D deficiency disease   . Wears dentures    full upper    Past Surgical History:  Procedure Laterality Date  . CERVICAL POLYPECTOMY    . CESAREAN SECTION     x 2  . COLONOSCOPY WITH PROPOFOL N/A 11/14/2018   Procedure: COLONOSCOPY WITH PROPOFOL;  Surgeon: TaVirgel ManifoldMD;  [place] [facility] Service: Endoscopy;  Laterality: N/A;  . ESOPHAGOGASTRODUODENOSCOPY (EGD) WITH PROPOFOL N/A 11/14/2018   Procedure: ESOPHAGOGASTRODUODENOSCOPY (EGD) WITH PROPOFOL;  Surgeon: TaVirgel ManifoldMD;  [place] [facility] Service: End[place];  Laterality: N/A;  Latex allergy  . GIVENS CAPSULE STUDY N/A 12/27/2018   Procedure: GIVENS CAPSULE STUDY;  Surgeon: TaVirgel ManifoldMD;  Location: [facility] ENDOSCOPY;  Service: Endoscopy;  Laterality: N/A;    There were no vitals filed for this visit.   Subjective Assessment - 06/12/20 1208    Subjective "There is so much going on. I feel overwhelmed."    Currently in Pain? No/denies                 ADULT SLP TREATMENT - 06/12/20 1208      General Information   Behavior/Cognition Alert;Cooperative    HPI Catherine Berg a [age].o. 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 Past medical history is noted for anxiety, asthma, Graves disease, HTN.      Treatment Provided   Treatment provided Cognitive-Linquistic      Pain Assessment   Pain Assessment No/denies pain      Cognitive-Linquistic Treatment   Treatment focused on Cognition;Patient/family/caregiver education    Skilled Treatment Patient used her meal plan to prepare Sunday meal and reports she did not get  overwhelmed; having the steps written out helped her to multitask. She reports being overwhelmed with home tasks today; 2 of her son's dogs had puppies and this has added to her daily responsibilities. She is behind on household chores. We discussed using a similar method to her meal plan strategy to organize, sequence and manage her daily tasks. Pt listed her tasks and then ranked them in priority with min question cues. She then scheduled them over the next 5 days with mod cues. She did not bring her planner today but SLP encouraged her to transfer this plan to it at home. Education on maximizing her effectiveness using energy conservation and scheduling tasks for when she is most alert and focused.      Assessment / Recommendations / Plan   Plan Continue with current plan of care       Progression Toward Goals   Progression toward goals Progressing toward goals            SLP Education - 06/12/20 1220    Education Details energy conservation, make a written plan    Person(s) Educated Patient    Methods Explanation    Comprehension Verbalized understanding;Returned demonstration;Need further instruction            SLP Short Term Goals - 05/13/20 1453      SLP SHORT TERM GOAL #1   Title Patient will obtain a system for recall/executive function and bring to at least 3 therapy sessions.    Time 4    Period Weeks    Status New    Target Date 06/12/20      SLP SHORT TERM GOAL #2   Title Patient will report no forgotten items at the grocery store using compensations for recall.    Time 4    Period Weeks    Status New            SLP Long Term Goals - 05/13/20 1457      SLP LONG TERM GOAL #1   Title Patient will use external aids/strategies to run 2+ errands in a row.    Time 8    Period Weeks    Status New      SLP LONG TERM GOAL #2   Title Patient will report no missed medications or bills 4 visits in a row using memory compensations.    Time 8    Period Weeks    Status New      SLP LONG TERM GOAL #3   Title Patient will use compensations for attention/information processing to complete mod complex cognitive linguistic tasks >85% accuracy.    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 Reports using her written meal plan to assist with multitasking; requires cues to generalize strategies to other aspects of her life such as household chores/schedule. I recommend skilled ST to train pt in compensations and strategies to improve her attention, recall and organization skills in order to improve function at work and home.    Speech Therapy Frequency 2x / week    Duration 8  weeks    Treatment/Interventions Environmental controls;Cueing hierarchy;SLP instruction and feedback;Cognitive reorganization;Compensatory techniques;Functional tasks;Compensatory strategies;Internal/external aids;Patient/family education    Potential to Achieve Goals Good    Consulted and Agree with Plan of Care Patient  Patient will benefit from skilled therapeutic intervention in order to improve the following deficits and impairments:   Cognitive communication deficit  COVID-19 long hauler manifesting chronic concentration deficit    Problem List Patient Active Problem List   Diagnosis Date Noted  . Vitamin D deficiency 04/22/2020  . 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 History of COVID-19 02/28/2020  . 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 Memory loss 02/28/2020  . Physical deconditioning 02/28/2020  . Dyspnea on exertion 02/28/2020  . Neck pain, bilateral 02/28/2020  . Muscle spasms of neck 02/28/2020  . Gallstones 01/30/2020  . COVID-19 01/23/2020  . Arthralgia of hand 01/24/2019  . CRP elevated 01/24/2019  . ESR raised 01/24/2019  . Class 3 severe obesity due to excess calories without serious comorbidity in adult ([place]) 01/24/2019  . Iron deficiency anemia   . Palpitations 09/16/2018  . Coronary artery calcification 09/16/2018  . 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 Bilateral leg edema 04/20/2017  . Incidental lung nodule, > 40m and < 880m11/28/2018  . Hives 09/02/2015  . Morbid obesity ([facility]/13/2017  . Anxiety 07/09/2015  . 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 Thyroid nodule 02/01/2015  . Vitamin D deficiency disease   . Vitamin B12 deficiency   . History of abnormal cervical Pap smear   . Allergy-induced asthma, mild intermittent, uncomplicated [id number] MaDeneise LeverMSMount AyrCCC-SLP Speech-Language Pathologist  MaAliene Altes/01/2021, 12:22 PM  [facility] [facility] [id number]: [phone] Fax:  [phone] Name: Catherine Berg: [id number] of Birth: [birth year]

## 2020-06-17 ENCOUNTER — Other Ambulatory Visit: Payer: Self-pay

## 2020-06-17 ENCOUNTER — Ambulatory Visit: Payer: Managed Care, Other (non HMO) | Admitting: Speech Pathology

## 2020-06-17 DIAGNOSIS — U099 Post covid-19 condition, unspecified: Secondary | ICD-10-CM

## 2020-06-17 DIAGNOSIS — R41841 Cognitive communication deficit: Secondary | ICD-10-CM | POA: Diagnosis not present

## 2020-06-17 DIAGNOSIS — R41840 Attention and concentration deficit: Secondary | ICD-10-CM

## 2020-06-17 NOTE — Patient Instructions (Signed)
Use your chalk squares to delegate tasks to your kids

## 2020-06-17 NOTE — Therapy (Signed)
[place] MAIN [facility] SERVICES [address] [place], [place], [address] Phone: [phone]   Fax:  [phone]  Speech Language Pathology Treatment  Patient Details  Name: Catherine Berg MRN: [id number] Date of Birth: 1981/03/04 Referring Provider (SLP): Lazaro Arms, NP   Encounter Date: 06/17/2020   End of Session - 06/17/20 1242    Visit Number 8    Number of Visits 17    Date for SLP Re-Evaluation 07/12/20    Authorization Type [employer]    Authorization - Visit Number 8    Progress Note Due on Visit 10    SLP Start Time 1104    SLP Stop Time  1155    SLP Time Calculation (min) 51 min    Activity Tolerance Patient tolerated treatment well           Past Medical History:  Diagnosis Date  . Abnormal thyroid blood test   . Anemia   . Anxiety   . Asthma   . Complication of anesthesia    itching after c sections  . 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 Graves disease   . History of abnormal cervical Pap smear   . History of cervical polypectomy   . Hives 09/02/2015  . Hypertension   . IFG (impaired fasting glucose)   . Incidental lung nodule, > 27m and < 818m11/28/2018   4 mm RLL lung nodule on chest CT Mar 31, 2017  . Low serum vitamin D   . Obesity   . Pregnancy induced hypertension    with 1st pregnancy, normal pressure with 2nd  . Reflux   . Thyroid disease   . Vitamin B12 deficiency   . Vitamin D deficiency disease   . Wears dentures    full upper    Past Surgical History:  Procedure Laterality Date  . CERVICAL POLYPECTOMY    . CESAREAN SECTION     x 2  . COLONOSCOPY WITH PROPOFOL N/A 11/14/2018   Procedure: COLONOSCOPY WITH PROPOFOL;  Surgeon: TaVirgel ManifoldMD;  [facility];  Laterality: N/A;  . ESOPHAGOGASTRODUODENOSCOPY (EGD) WITH PROPOFOL N/A 11/14/2018   Procedure: ESOPHAGOGASTRODUODENOSCOPY (EGD) WITH PROPOFOL;  Surgeon: TaVirgel ManifoldMD;  [facility];  Laterality: N/A;  Latex allergy  . GIVENS CAPSULE STUDY N/A 12/27/2018   Procedure: GIVENS CAPSULE STUDY;  Surgeon: TaVirgel ManifoldMD;  Location: [facility];  Service: Endoscopy;  Laterality: N/A;    There were no vitals filed for this visit.   Subjective Assessment - 06/17/20 1231    Subjective "It seems like things just don't flow like they used to."    Currently in Pain? No/denies                 ADULT SLP TREATMENT - 06/17/20 1232      General Information   Behavior/Cognition Alert;Cooperative    HPI Catherine L. HaAldean Bakers a [age].o. 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 Past medical history is noted for anxiety, asthma, Graves disease, HTN.      Treatment Provided   Treatment provided Cognitive-Linquistic      Pain Assessment   Pain Assessment No/denies pain      Cognitive-Linquistic Treatment   Treatment focused on Cognition;Patient/family/caregiver education    Skilled Treatment Patient reports she was able to catch up on chores over the weekend;  preplanning these was helpful. Continues to struggle with the flow of the day; SLP worked with pt to ID strategies she can incorporate into her routine to improve this. SLP asked pt to reflect on what has helped her manage time with regard to meal preparation and scheduling chores; pt stated that writing herself an hourly schedule might assist with her day. Pt used her phone to create schedule checklist, listing her routine tasks as well as novel tasks and chores for today with min A for organization. As pt reviewed her checklist, she rearranged some items to fit better into her timeline. Required min cue to add putting clothes in the dryer to her agenda. Pt independently set alarm to remind her to work on her schedule in the morning while she drinks her coffee. Min cue to set alarm to  remind her to start motivating her kids in the morning.      Assessment / Recommendations / Plan   Plan Continue with current plan of care      Progression Toward Goals   Progression toward goals Progressing toward goals            SLP Education - 06/17/20 1241    Education Details how she can adopt strategies and make them routines/habits    Person(s) Educated Patient    Methods Explanation    Comprehension Verbalized understanding;Need further instruction            SLP Short Term Goals - 05/13/20 1453      SLP SHORT TERM GOAL #1   Title Patient will obtain a system for recall/executive function and bring to at least 3 therapy sessions.    Time 4    Period Weeks    Status New    Target Date 06/12/20      SLP SHORT TERM GOAL #2   Title Patient will report no forgotten items at the grocery store using compensations for recall.    Time 4    Period Weeks    Status New            SLP Long Term Goals - 05/13/20 1457      SLP LONG TERM GOAL #1   Title Patient will use external aids/strategies to run 2+ errands in a row.    Time 8    Period Weeks    Status New      SLP LONG TERM GOAL #2   Title Patient will report no missed medications or bills 4 visits in a row using memory compensations.    Time 8    Period Weeks    Status New      SLP LONG TERM GOAL #3   Title Patient will use compensations for attention/information processing to complete mod complex cognitive linguistic tasks >85% accuracy.    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 Patient receptive to use scheduling/list to assist with daily chores and schedule today. Generated strategy of setting an alarm to help her remember to do this daily. I recommend skilled ST to train pt in compensations and strategies to improve her  attention, recall and organization skills in order to improve function at work and home.    Speech Therapy Frequency 2x / week    Duration 8 weeks  Treatment/Interventions Environmental controls;Cueing hierarchy;SLP instruction and feedback;Cognitive reorganization;Compensatory techniques;Functional tasks;Compensatory strategies;Internal/external aids;Patient/family education    Potential to Achieve Goals Good    Consulted and Agree with Plan of Care Patient           Patient will benefit from skilled therapeutic intervention in order to improve the following deficits and impairments:   Cognitive communication deficit  COVID-19 long hauler manifesting chronic concentration deficit    Problem List Patient Active Problem List   Diagnosis Date Noted  . Vitamin D deficiency 04/22/2020  . 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 History of COVID-19 02/28/2020  . 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 Memory loss 02/28/2020  . Physical deconditioning 02/28/2020  . Dyspnea on exertion 02/28/2020  . Neck pain, bilateral 02/28/2020  . Muscle spasms of neck 02/28/2020  . Gallstones 01/30/2020  . COVID-19 01/23/2020  . Arthralgia of hand 01/24/2019  . CRP elevated 01/24/2019  . ESR raised 01/24/2019  . Class 3 severe obesity due to excess calories without serious comorbidity in adult ([place]) 01/24/2019  . Iron deficiency anemia   . Palpitations 09/16/2018  . Coronary artery calcification 09/16/2018  . 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 Bilateral leg edema 04/20/2017  . Incidental lung nodule, > 98m and < 860m11/28/2018  . Hives 09/02/2015  . Morbid obesity ([facility]04/13/2017  . Anxiety 07/09/2015  . 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 Thyroid nodule 02/01/2015  . Vitamin D deficiency disease   . Vitamin B12 deficiency   . History of abnormal cervical Pap smear   . Allergy-induced asthma, mild intermittent, uncomplicated [id number] MaDeneise LeverMSClear CreekCCC-SLP Speech-Language Pathologist  MaAliene Altes/14/2022, 12:44 PM  CoVirginiaAIN REB[facility] CenterERVICES [address] [place]hone: [phone] Fax:  [phone] Name: Catherine SHISLERRN: [id number] of Birth: [birth year]

## 2020-06-18 ENCOUNTER — Telehealth: Payer: Managed Care, Other (non HMO) | Admitting: Physician Assistant

## 2020-06-18 DIAGNOSIS — N760 Acute vaginitis: Secondary | ICD-10-CM

## 2020-06-18 MED ORDER — FLUCONAZOLE 150 MG PO TABS: 150.0000 mg | ORAL_TABLET | Freq: Once | ORAL | 0 refills | Status: AC

## 2020-06-18 NOTE — Progress Notes (Signed)
We are sorry that you are not feeling well. Here is how we plan to help! Based on what you shared with me it looks like you: May have a yeast vaginosis  Vaginosis is an inflammation of the vagina that can result in discharge, itching and pain. The cause is usually a change in the normal balance of vaginal bacteria or an infection. Vaginosis can also result from reduced estrogen levels after menopause.  The most common causes of vaginosis are:   Bacterial vaginosis which results from an overgrowth of one on several organisms that are normally present in your vagina.   Yeast infections which are caused by a naturally occurring fungus called candida.   Vaginal atrophy (atrophic vaginosis) which results from the thinning of the vagina from reduced estrogen levels after menopause.   Trichomoniasis which is caused by a parasite and is commonly transmitted by sexual intercourse.  Factors that increase your risk of developing vaginosis include: . Medications, such as antibiotics and steroids . Uncontrolled diabetes . Use of hygiene products such as bubble bath, vaginal spray or vaginal deodorant . Douching . Wearing damp or tight-fitting clothing . Using an intrauterine device (IUD) for birth control . Hormonal changes, such as those associated with pregnancy, birth control pills or menopause . Sexual activity . Having a sexually transmitted infection  Your treatment plan is A single Diflucan (fluconazole) 150mg tablet once.  I have electronically sent this prescription into the pharmacy that you have chosen.  Be sure to take all of the medication as directed. Stop taking any medication if you develop a rash, tongue swelling or shortness of breath. Mothers who are breast feeding should consider pumping and discarding their breast milk while on these antibiotics. However, there is no consensus that infant exposure at these doses would be harmful.  Remember that medication creams can weaken latex  condoms. .   HOME CARE:  Good hygiene may prevent some types of vaginosis from recurring and may relieve some symptoms:  . Avoid baths, hot tubs and whirlpool spas. Rinse soap from your outer genital area after a shower, and dry the area well to prevent irritation. Don't use scented or harsh soaps, such as those with deodorant or antibacterial action. . Avoid irritants. These include scented tampons and pads. . Wipe from front to back after using the toilet. Doing so avoids spreading fecal bacteria to your vagina.  Other things that may help prevent vaginosis include:  . Don't douche. Your vagina doesn't require cleansing other than normal bathing. Repetitive douching disrupts the normal organisms that reside in the vagina and can actually increase your risk of vaginal infection. Douching won't clear up a vaginal infection. . Use a latex condom. Both female and female latex condoms may help you avoid infections spread by sexual contact. . Wear cotton underwear. Also wear pantyhose with a cotton crotch. If you feel comfortable without it, skip wearing underwear to bed. Yeast thrives in moist environments Your symptoms should improve in the next day or two.  GET HELP RIGHT AWAY IF:  . You have pain in your lower abdomen ( pelvic area or over your ovaries) . You develop nausea or vomiting . You develop a fever . Your discharge changes or worsens . You have persistent pain with intercourse . You develop shortness of breath, a rapid pulse, or you faint.  These symptoms could be signs of problems or infections that need to be evaluated by a medical provider now.  MAKE SURE YOU      Understand these instructions.  Will watch your condition.  Will get help right away if you are not doing well or get worse.  Your e-visit answers were reviewed by a board certified advanced clinical practitioner to complete your personal care plan. Depending upon the condition, your plan could have included  both over the counter or prescription medications. Please review your pharmacy choice to make sure that you have choses a pharmacy that is open for you to pick up any needed prescription, Your safety is important to us. If you have drug allergies check your prescription carefully.   You can use MyChart to ask questions about today's visit, request a non-urgent call back, or ask for a work or school excuse for 24 hours related to this e-Visit. If it has been greater than 24 hours you will need to follow up with your provider, or enter a new e-Visit to address those concerns. You will get a MyChart message within the next two days asking about your experience. I hope that your e-visit has been valuable and will speed your recovery.  Greater than 5 minutes, yet less than 10 minutes of time have been spent researching, coordinating, and implementing care for this patient today 

## 2020-06-19 ENCOUNTER — Other Ambulatory Visit: Payer: Self-pay

## 2020-06-19 ENCOUNTER — Ambulatory Visit: Payer: Managed Care, Other (non HMO) | Admitting: Speech Pathology

## 2020-06-19 ENCOUNTER — Ambulatory Visit (INDEPENDENT_AMBULATORY_CARE_PROVIDER_SITE_OTHER): Payer: Managed Care, Other (non HMO) | Admitting: Neurology

## 2020-06-19 DIAGNOSIS — R0601 Orthopnea: Secondary | ICD-10-CM

## 2020-06-19 DIAGNOSIS — R41840 Attention and concentration deficit: Secondary | ICD-10-CM

## 2020-06-19 DIAGNOSIS — U099 Post covid-19 condition, unspecified: Secondary | ICD-10-CM

## 2020-06-19 DIAGNOSIS — R0609 Other forms of dyspnea: Secondary | ICD-10-CM

## 2020-06-19 DIAGNOSIS — R41841 Cognitive communication deficit: Secondary | ICD-10-CM | POA: Diagnosis not present

## 2020-06-19 DIAGNOSIS — R430 Anosmia: Secondary | ICD-10-CM

## 2020-06-19 DIAGNOSIS — G4733 Obstructive sleep apnea (adult) (pediatric): Secondary | ICD-10-CM

## 2020-06-19 DIAGNOSIS — G9331 Postviral fatigue syndrome: Secondary | ICD-10-CM

## 2020-06-19 DIAGNOSIS — G933 Postviral fatigue syndrome: Secondary | ICD-10-CM

## 2020-06-19 NOTE — Therapy (Signed)
[place] MAIN [facility] SERVICES [address] [place], [place], [address] Phone: [phone]   Fax:  [phone]  Speech Language Pathology Treatment  Patient Details  Name: Catherine Berg MRN: [id number] Date of Birth: 10-05-1980 Referring Provider (SLP): Lazaro Arms, NP   Encounter Date: 06/19/2020   End of Session - 06/19/20 1239    Visit Number 9    Number of Visits 17    Date for SLP Re-Evaluation 07/12/20    Authorization Type [employer]    Authorization - Visit Number 9    Progress Note Due on Visit 10    SLP Start Time 1118    SLP Stop Time  1200    SLP Time Calculation (min) 42 min    Activity Tolerance Patient tolerated treatment well           Past Medical History:  Diagnosis Date  . Abnormal thyroid blood test   . Anemia   . Anxiety   . Asthma   . Complication of anesthesia    itching after c sections  . 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 Graves disease   . History of abnormal cervical Pap smear   . History of cervical polypectomy   . Hives 09/02/2015  . Hypertension   . IFG (impaired fasting glucose)   . Incidental lung nodule, > 43m and < 832m11/28/2018   4 mm RLL lung nodule on chest CT Mar 31, 2017  . Low serum vitamin D   . Obesity   . Pregnancy induced hypertension    with 1st pregnancy, normal pressure with 2nd  . Reflux   . Thyroid disease   . Vitamin B12 deficiency   . Vitamin D deficiency disease   . Wears dentures    full upper    Past Surgical History:  Procedure Laterality Date  . CERVICAL POLYPECTOMY    . CESAREAN SECTION     x 2  . COLONOSCOPY WITH PROPOFOL N/A 11/14/2018   Procedure: COLONOSCOPY WITH PROPOFOL;  Surgeon: TaVirgel ManifoldMD;  [place] [facility];  Laterality: N/A;  . ESOPHAGOGASTRODUODENOSCOPY (EGD) WITH PROPOFOL N/A 11/14/2018   Procedure: ESOPHAGOGASTRODUODENOSCOPY (EGD) WITH PROPOFOL;  Surgeon: TaVirgel ManifoldMD;  [place] [facility];  Laterality: N/A;  Latex allergy  . GIVENS CAPSULE STUDY N/A 12/27/2018   Procedure: GIVENS CAPSULE STUDY;  Surgeon: TaVirgel ManifoldMD;  Location: [facility];  Service: Endoscopy;  Laterality: N/A;    There were no vitals filed for this visit.   Subjective Assessment - 06/19/20 1121    Subjective "I lost track of time."    Currently in Pain? No/denies                 ADULT SLP TREATMENT - 06/19/20 1153      General Information   Behavior/Cognition Alert;Cooperative    HPI Catherine L. HaAldean Bakers a [age].o. 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 Past medical history is noted for anxiety, asthma, Graves disease, HTN.      Treatment Provided   Treatment provided Cognitive-Linquistic      Pain Assessment   Pain Assessment No/denies pain      Cognitive-Linquistic Treatment   Treatment focused on Cognition;Patient/family/caregiver education    Skilled Treatment Patient has continued using schedule planning on her phone each morning. She reported that after creating schedule for the  day in therapy last session, the rest of her day flowed smoothly. Discussed ability to return to work; pt feels anxious about her ability to do this, mainly due to memory difficulties. She gave the example that if she has a therapy session, she often can't remember what we discussed at the end of the day. This makes her nervous to interact with the general public. We discussed using her phone or notebook to take notes during therapy sessions or phone calls, or after having important conversations. Discussed strategies to help her complete/manage tasks during the weekend, as her schedule is different on weekends. Pt generated idea to create a task list and share it with her children, listing their jobs as well. She set an alarm to remind herself to do this later  after session.      Assessment / Recommendations / Plan   Plan Continue with current plan of care      Progression Toward Goals   Progression toward goals Progressing toward goals            SLP Education - 06/19/20 1239    Education Details use strategies on weekends, too; feel free to adapt these to her routine/schedule as needed    Person(s) Educated Patient    Methods Explanation    Comprehension Verbalized understanding;Returned demonstration            SLP Short Term Goals - 05/13/20 1453      SLP SHORT TERM GOAL #1   Title Patient will obtain a system for recall/executive function and bring to at least 3 therapy sessions.    Time 4    Period Weeks    Status New    Target Date 06/12/20      SLP SHORT TERM GOAL #2   Title Patient will report no forgotten items at the grocery store using compensations for recall.    Time 4    Period Weeks    Status New            SLP Long Term Goals - 05/13/20 1457      SLP LONG TERM GOAL #1   Title Patient will use external aids/strategies to run 2+ errands in a row.    Time 8    Period Weeks    Status New      SLP LONG TERM GOAL #2   Title Patient will report no missed medications or bills 4 visits in a row using memory compensations.    Time 8    Period Weeks    Status New      SLP LONG TERM GOAL #3   Title Patient will use compensations for attention/information processing to complete mod complex cognitive linguistic tasks >85% accuracy.    Time 8    Period Weeks    Status New            Plan - 06/19/20 1240    Clinical Impression Statement Catherine Berg was consistent with preplanning her daily schedule for the last 3 days. Reports this has been helpful for her. She was able to generate an appropriate strategy to use to manage her schedule over the weekend with initial cues to ID barrier. Demonstrates carryover of using alarms to help her remember and follow-through with plans. I recommend skilled ST to train pt in  compensations and strategies to improve her attention, recall and organization skills in order to improve function at work and home.    Speech Therapy Frequency 2x / week  Duration 8 weeks    Treatment/Interventions Environmental controls;Cueing hierarchy;SLP instruction and feedback;Cognitive reorganization;Compensatory techniques;Functional tasks;Compensatory strategies;Internal/external aids;Patient/family education    Potential to Achieve Goals Good    Consulted and Agree with Plan of Care Patient           Patient will benefit from skilled therapeutic intervention in order to improve the following deficits and impairments:   Cognitive communication deficit  COVID-19 long hauler manifesting chronic concentration deficit    Problem List Patient Active Problem List   Diagnosis Date Noted  . Vitamin D deficiency 04/22/2020  . 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 History of COVID-19 02/28/2020  . 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 Memory loss 02/28/2020  . Physical deconditioning 02/28/2020  . Dyspnea on exertion 02/28/2020  . Neck pain, bilateral 02/28/2020  . Muscle spasms of neck 02/28/2020  . Gallstones 01/30/2020  . COVID-19 01/23/2020  . Arthralgia of hand 01/24/2019  . CRP elevated 01/24/2019  . ESR raised 01/24/2019  . Class 3 severe obesity due to excess calories without serious comorbidity in adult ([place]) 01/24/2019  . Iron deficiency anemia   . Palpitations 09/16/2018  . Coronary artery calcification 09/16/2018  . 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 Bilateral leg edema 04/20/2017  . Incidental lung nodule, > 22m and < 858m11/28/2018  . Hives 09/02/2015  . Morbid obesity ([place]04/13/2017  . Anxiety 07/09/2015  . 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 Thyroid nodule 02/01/2015  . Vitamin D deficiency disease   . Vitamin B12 deficiency   . History of abnormal cervical Pap smear   . Allergy-induced asthma, mild  intermittent, uncomplicated [id number] MaDeneise LeverMSValley GreenCCC-SLP Speech-Language Pathologist  MaAliene Altes/16/2022, 12:43 PM  CoPrestonAIN REDel[facility] SuitesERVICES [address]hone: [phone] Fax:  [phone] Name: Catherine BALZARINIRN: [id number] of Birth: [birth year]

## 2020-06-24 ENCOUNTER — Ambulatory Visit: Payer: Managed Care, Other (non HMO) | Admitting: Speech Pathology

## 2020-06-24 DIAGNOSIS — R41841 Cognitive communication deficit: Secondary | ICD-10-CM

## 2020-06-24 DIAGNOSIS — U099 Post covid-19 condition, unspecified: Secondary | ICD-10-CM

## 2020-06-24 DIAGNOSIS — R41840 Attention and concentration deficit: Secondary | ICD-10-CM

## 2020-06-24 NOTE — Therapy (Signed)
[place] MAIN [facility] SERVICES [address] [place], [place], [address] Phone: [phone]   Fax:  [phone]  Speech Language Pathology Treatment and Progress Note  Patient Details  Name: Catherine Berg MRN: [id number] Date of Birth: [birth year] Referring Provider (SLP): Lazaro Arms, NP  Speech Therapy Progress Note  Dates of Reporting Period: Patient has been seen for 10 speech therapy sessions targeting compensations for cognitive communication deficits during this reporting period 05/13/20-06/24/20. See Skilled Intervention, Clinical Impressions and goals below for details.    Encounter Date: 06/24/2020   End of Session - 06/24/20 1259    Visit Number 10    Number of Visits 17    Date for SLP Re-Evaluation 07/12/20    Authorization Type [employer]    Authorization - Visit Number 10    Progress Note Due on Visit 10    SLP Start Time 1113    SLP Stop Time  1200    SLP Time Calculation (min) 47 min    Activity Tolerance Patient tolerated treatment well           Past Medical History:  Diagnosis Date  . Abnormal thyroid blood test   . Anemia   . Anxiety   . Asthma   . Complication of anesthesia    itching after c sections  . 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 Graves disease   . History of abnormal cervical Pap smear   . History of cervical polypectomy   . Hives 09/02/2015  . Hypertension   . IFG (impaired fasting glucose)   . Incidental lung nodule, > 3m and < 889m11/28/2018   4 mm RLL lung nodule on chest CT Mar 31, 2017  . Low serum vitamin D   . Obesity   . Pregnancy induced hypertension    with 1st pregnancy, normal pressure with 2nd  . Reflux   . Thyroid disease   . Vitamin B12 deficiency   . Vitamin D deficiency disease   . Wears dentures    full upper    Past Surgical History:  Procedure Laterality Date  . CERVICAL POLYPECTOMY    . CESAREAN SECTION     x 2  . COLONOSCOPY WITH PROPOFOL  N/A 11/14/2018   Procedure: COLONOSCOPY WITH PROPOFOL;  Surgeon: TaVirgel ManifoldMD;  [place];  Laterality: N/A;  . ESOPHAGOGASTRODUODENOSCOPY (EGD) WITH PROPOFOL N/A 11/14/2018   Procedure: ESOPHAGOGASTRODUODENOSCOPY (EGD) WITH PROPOFOL;  Surgeon: TaVirgel ManifoldMD;  [place];  Laterality: N/A;  Latex allergy  . GIVENS CAPSULE STUDY N/A 12/27/2018   Procedure: GIVENS CAPSULE STUDY;  Surgeon: TaVirgel ManifoldMD;  Location: [facility] ENDOSCOPY;  Service: Endoscopy;  Laterality: N/A;    There were no vitals filed for this visit.   Subjective Assessment - 06/24/20 1129    Subjective Didn't have time to plan schedule this AM due to car trouble    Currently in Pain? No/denies                 ADULT SLP TREATMENT - 06/24/20 1129      General Information   Behavior/Cognition Alert;Cooperative    HPI Catherine Berg a 89.o. 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 Past medical history is noted for anxiety, asthma, Graves disease, HTN.      Treatment Provided   Treatment provided Cognitive-Linquistic  Cognitive-Linquistic Treatment   Treatment focused on Cognition;Patient/family/caregiver education    Skilled Treatment Patient routine this weekend was thrown off as she had car trouble, but she was finally able to resolve this morning. Pt recalled details re: weekend with difficulty initially; declined to reference her planner : "I want to remember this," and was ultimately able to do so. SLP encouraged pt not to think of memory compensations as a "crutch" but as a tool she can use to help her be successful and even to improve her memory. Continued to target use of strategies to reduce cognitive fatigue and improve executive function skills, energy conservation. Pt completed her daily schedule plan, scheduling  necessary steps for the remainder of today's tasks and setting an alarm to remind her to take out pork chops for dinner, with modified independence. See pt instructions for compensations/strategies identified; pt to practice setting a timer for brief periods to help with task completion, and to begin to use systematic approach to sort her mail for homework this week.      Assessment / Recommendations / Plan   Plan Continue with current plan of care      Progression Toward Goals   Progression toward goals Progressing toward goals            SLP Education - 06/24/20 1258    Education Details set timer for task focus periods of 15-20 minutes, set a timer for breaks    Person(s) Educated Patient    Methods Explanation    Comprehension Verbalized understanding            SLP Short Term Goals - 06/24/20 1300      SLP SHORT TERM GOAL #1   Title Patient will obtain a system for recall/executive function and bring to at least 3 therapy sessions.    Time 4    Period Weeks    Status Achieved    Target Date 06/12/20      SLP SHORT TERM GOAL #2   Title Patient will report no forgotten items at the grocery store using compensations for recall.    Time 4    Period Weeks    Status Achieved            SLP Long Term Goals - 06/24/20 1300      SLP LONG TERM GOAL #1   Title Patient will use external aids/strategies to run 2+ errands in a row.    Time 8    Period Weeks    Status Partially Met      SLP LONG TERM GOAL #2   Title Patient will report no missed medications or bills 4 visits in a row using memory compensations.    Time 8    Period Weeks    Status On-going      SLP LONG TERM GOAL #3   Title Patient will use compensations for attention/information processing to complete mod complex cognitive linguistic tasks >85% accuracy.    Time 8    Period Weeks    Status Partially Met            Plan - 06/24/20 1301    Clinical Impression Statement Catherine Berg continues to use  planner and her phone to aid in scheduling and organizing home tasks and personal responsibilities, but requires cues for consistency with this when unplanned or unpredictable events occur. She is independently initiating use of timer or alarm for some personal reminders. She has met 2/2 STGs and is progressing toward LTGs. I recommend  skilled ST to train pt in compensations and strategies to improve her attention, recall and organization skills in order to improve function at work and home.    Speech Therapy Frequency 2x / week    Duration 8 weeks    Treatment/Interventions Environmental controls;Cueing hierarchy;SLP instruction and feedback;Cognitive reorganization;Compensatory techniques;Functional tasks;Compensatory strategies;Internal/external aids;Patient/family education    Potential to Achieve Goals Good    Consulted and Agree with Plan of Care Patient           Patient will benefit from skilled therapeutic intervention in order to improve the following deficits and impairments:   Cognitive communication deficit  COVID-19 long hauler manifesting chronic concentration deficit    Problem List Patient Active Problem List   Diagnosis Date Noted  . Vitamin D deficiency 04/22/2020  . 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 History of COVID-19 02/28/2020  . 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 Memory loss 02/28/2020  . Physical deconditioning 02/28/2020  . Dyspnea on exertion 02/28/2020  . Neck pain, bilateral 02/28/2020  . Muscle spasms of neck 02/28/2020  . Gallstones 01/30/2020  . COVID-19 01/23/2020  . Arthralgia of hand 01/24/2019  . CRP elevated 01/24/2019  . ESR raised 01/24/2019  . Class 3 severe obesity due to excess calories without serious comorbidity in adult ([facility]) 01/24/2019  . Iron deficiency anemia   . Palpitations 09/16/2018  . Coronary artery calcification 09/16/2018  . 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 Bilateral leg edema  04/20/2017  . Incidental lung nodule, > 48m and < 851m11/28/2018  . Hives 09/02/2015  . Morbid obesity (HCPage04/13/2017  . Anxiety 07/09/2015  . 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 Thyroid nodule 02/01/2015  . Vitamin D deficiency disease   . Vitamin B12 deficiency   . History of abnormal cervical Pap smear   . Allergy-induced asthma, mild intermittent, uncomplicated [id number] MaDeneise LeverMSCharlackCCC-SLP Speech-Language Pathologist  MaAliene Altes/21/2022, 1:04 PM  [facility] [facility] [address]hone: [phone] Fax:  [phone] Name: TaROSALINE EZEKIELRN: [id number] of Birth: 9/04-Oct-1980

## 2020-06-24 NOTE — Patient Instructions (Signed)
To deal with overwhelm when you have too many tasks in front of you:  1. Lighten your load by delegating. Use the shared list or set a schedule with your kids to communicate your expectations. (laundry?) 2. Breaking down larger tasks into pieces and make a checklist. Check things off as you go. 3. Set a timer for yourself (15-20 minutes) and then do as much as you can do in that time. 4. Take breaks. Set a timer for your break, and then see if you can return for another "focus period" of 15-20 minutes after your break.  5. Try this with your mail: JUST START WITH SORTING, 15 minutes at time. Don't feel like you need to take action.   1. First sort out all trash (use a box or bin where you can store things separately until              you're ready to burn or shred). (Homework).  2. Separate piles for bills and important mail.   3. Make a pile of things you need to deal with sooner rather than later.

## 2020-06-25 ENCOUNTER — Ambulatory Visit: Payer: Managed Care, Other (non HMO)

## 2020-06-25 NOTE — Progress Notes (Signed)
   [facility]  HOME SLEEP TEST (Watch PAT)  STUDY DOWNLOAD: 06/25/20  DOB: July 25, 1980  MRN: [id number]  ORDERING CLINICIAN: Melvyn Novas, MD   REFERRING CLINICIANAlba Cory, MD   CLINICAL INFORMATION/HISTORY: Catherine Berg a 40 y.o.  African- American female patientand seen here upon referralon 05/28/2020 from [facility] Mccreedy Chiefconcernaccording to patient : " I worked at [employer] when I contracted Covid 19, in September 2021." She was not vaccinated, and got severely ill.  She avoided hospitalization by being treated with steroids and oxygen , she didn't agree to monoclonal Ab treatments yet she had SOB, and extreme myalgia, fatigue.   Her nephew and son ( 71 and 78 years old ) ended up with COVID but recovered easier and completely.  She remains fatigued, with near syncope spells and loss of smell and taste and she is highly anxious.  She sleeps only with medication and still only 4-5 hours, waking up at 2-3 AM, she can't take naps. She feels hypervigilant and worried about health. She is still using her pulse ox and she noted high heart rates intermittently , oxygen stays in the 90s.  She has a past medical history of Abnormal thyroid blood test, Anemia, Anxiety, Asthma post COVID, COVID 19 without pneumonia, buy ageusia and anosmnia, Complication of anesthesia, Elevated serum glutamic pyruvic transaminase (SGPT) level, Graves disease, History of abnormal cervical Pap smear, History of cervical polypectomy, Hives (09/02/2015), Hypertension, IFG (impaired fasting glucose), Incidental lung nodule, > 80mm and < 30mm (03/31/2017), Low serum vitamin D, Obesity, Pregnancy induced hypertension, Reflux, Thyroid disease, Vitamin B12 deficiency, Vitamin D deficiency disease, and Wears dentures.  Epworth sleepiness score: 13/24. BMI: 41.8 kg/m Neck Circumference: 15.5  "  FINDINGS:  Total Record Time (hours, min): 7 h 31 min Total Sleep Time (hours, min):  6 h 13  min   Percent REM (%):    34.40 %   Calculated pAHI (per hour):  0       REM pAHI:   0    NREM pAHI: 0  Supine AHI: 0   Oxygen Saturation (%) Mean: 96  Minimum oxygen saturation (%):        94   O2 Saturation Range (%): 94-98  O2Saturation (minutes) <=88%: 0 min  Pulse Mean (bpm):    79  Pulse Range (57-108)   IMPRESSION: The very unexpected finding of this HST was there was no evidence of  OSA (obstructive sleep apnea) sufficient sleep time, normal oxygenation and normal heart rate were noted.   RECOMMENDATION:  This HST found no tachycardia, hypoxia or apnea.    INTERPRETING PHYSICIAN:  Melvyn Novas, MD  [facility] Sleep Board certified by The [employer] of Sleep Medicine and Diplomate of the [employer] of Sleep Medicine. Board certified In Neurology through the ABPN, Fellow of the [employer] of Neurology. Medical Director of [employer].

## 2020-06-27 ENCOUNTER — Ambulatory Visit: Payer: Managed Care, Other (non HMO) | Admitting: Speech Pathology

## 2020-06-27 ENCOUNTER — Other Ambulatory Visit: Payer: Self-pay

## 2020-06-27 ENCOUNTER — Ambulatory Visit: Payer: Managed Care, Other (non HMO)

## 2020-06-27 ENCOUNTER — Ambulatory Visit (INDEPENDENT_AMBULATORY_CARE_PROVIDER_SITE_OTHER): Payer: Managed Care, Other (non HMO) | Admitting: Nurse Practitioner

## 2020-06-27 VITALS — BP 141/84 | HR 76 | Temp 97.3°F | Resp 16 | Wt 235.0 lb

## 2020-06-27 DIAGNOSIS — F419 Anxiety disorder, unspecified: Secondary | ICD-10-CM

## 2020-06-27 DIAGNOSIS — R41841 Cognitive communication deficit: Secondary | ICD-10-CM | POA: Diagnosis not present

## 2020-06-27 DIAGNOSIS — R41840 Attention and concentration deficit: Secondary | ICD-10-CM

## 2020-06-27 DIAGNOSIS — R5381 Other malaise: Secondary | ICD-10-CM | POA: Diagnosis not present

## 2020-06-27 DIAGNOSIS — R413 Other amnesia: Secondary | ICD-10-CM

## 2020-06-27 DIAGNOSIS — Z8616 Personal history of COVID-19: Secondary | ICD-10-CM

## 2020-06-27 DIAGNOSIS — F32A Depression, unspecified: Secondary | ICD-10-CM | POA: Insufficient documentation

## 2020-06-27 MED ORDER — HYDROXYZINE HCL 10 MG PO TABS: 10.0000 mg | ORAL_TABLET | Freq: Three times a day (TID) | ORAL | 0 refills | Status: DC | PRN

## 2020-06-27 NOTE — Assessment & Plan Note (Signed)
Shortness of breath:   Stay well hydrated  Stay active  Deep breathing exercises  May take tylenol or fever or pain   Fatigue Memory loss:  Continue PT  Order has been placed for speech - cognitive rehabilitation  Will order labs    Memory loss Olfactory Dysfunction:  Keep appointment with neurology    Follow up:  Follow up in 3 months if needed

## 2020-06-27 NOTE — Progress Notes (Signed)
@Patient  ID: , female    DOB: [birth year], 40 y.o.   MRN: [id number]  Chief Complaint  Patient presents with  . Follow-up    Referring provider: [id number], MD  HPI  Patient presents today for post COVID care clinic visit.  Patient was last seen in our office on 04/22/2020.  Patient has been participating in physical therapy and cognitive rehabilitation.  She has seen neurology.  She has been referred to a psychiatrist.  She is having a lot of issues with anxiety and depression.  She states that her fatigue and memory issues are slowly improving.  We discussed the use of Atarax as needed could help with memory function and anxiety and depression.  She has not been taking her current dose of Atarax because it causes her to be too drowsy.  We will decrease his dose. Denies f/c/s, n/v/d, hemoptysis, PND, chest pain or edema       Allergies  Allergen Reactions  . Latex Hives    (Balloons, condoms, underwear elastic, gloves)  . Shellfish Allergy Hives    Immunization History  Administered Date(s) Administered  . Hepatitis A 01/31/2007, 08/10/2007  . Hepatitis B 01/31/2007, 08/10/2007, 07/02/2008  . Influenza,inj,Quad PF,6+ Mos 03/15/2018, 01/05/2019, 03/06/2020  . Td 10/29/1995, 08/03/2017  . Tdap 08/10/2007    Past Medical History:  Diagnosis Date  . Abnormal thyroid blood test   . Anemia   . Anxiety   . Asthma   . Complication of anesthesia    itching after c sections  . 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 Graves disease   . History of abnormal cervical Pap smear   . History of cervical polypectomy   . Hives 09/02/2015  . Hypertension   . IFG (impaired fasting glucose)   . Incidental lung nodule, > 6mm and < 105mm 03/31/2017   4 mm RLL lung nodule on chest CT Mar 31, 2017  . Low serum vitamin D   . Obesity   . Pregnancy induced hypertension    with 1st pregnancy, normal pressure with 2nd  . Reflux   . Thyroid disease   .  Vitamin B12 deficiency   . Vitamin D deficiency disease   . Wears dentures    full upper    Tobacco History: Social History   Tobacco Use  Smoking Status Never Smoker  Smokeless Tobacco Never Used   Counseling given: Not Answered   Outpatient Encounter Medications as of 06/27/2020  Medication Sig  . hydrOXYzine (ATARAX/VISTARIL) 10 MG tablet Take 1 tablet (10 mg total) by mouth 3 (three) times daily as needed.  06/29/2020 albuterol (PROVENTIL HFA;VENTOLIN HFA) 108 (90 Base) MCG/ACT inhaler Inhale 2 puffs into the lungs every 6 (six) hours as needed for wheezing or shortness of breath.  . amphetamine-dextroamphetamine (ADDERALL XR) 15 MG 24 hr capsule Take 1 capsule by mouth daily.  . clobetasol (TEMOVATE) 0.05 % external solution Apply 1 application topically 2 (two) times daily.  . Cyanocobalamin (B-12) 1000 MCG SUBL Place 1 tablet under the tongue daily.  . DULoxetine (CYMBALTA) 60 MG capsule Take 1 capsule (60 mg total) by mouth daily. First week take one after that two daily  . Elastic Bandages & Supports (MEDICAL COMPRESSION STOCKINGS) MISC Wear from morning to night, but do not sleep in these  . Etonogestrel (NEXPLANON [place]) Inject into the skin.  . famotidine (PEPCID) 20 MG tablet Take 1 tablet (20 mg total) by mouth daily.  . fluticasone (FLONASE)  50 MCG/ACT nasal spray Place 2 sprays into both nostrils daily.  Marland Kitchen levothyroxine (SYNTHROID) 50 MCG tablet Take 75 mcg by mouth daily.  . montelukast (SINGULAIR) 10 MG tablet Take 1 tablet (10 mg total) by mouth at bedtime.  . pregabalin (LYRICA) 75 MG capsule Take 1 capsule (75 mg total) by mouth at bedtime as needed.  Marland Kitchen tiZANidine (ZANAFLEX) 4 MG tablet Take 0.5-1.5 tablets (2-6 mg total) by mouth every 8 (eight) hours as needed for muscle spasms (muscle tightness).  . traZODone (DESYREL) 50 MG tablet Take 0.5-1 tablets (25-50 mg total) by mouth at bedtime as needed for sleep.  . Vitamin D, Ergocalciferol, (DRISDOL) 1.25 MG (50000 UT) CAPS  capsule Take 1 capsule (50,000 Units total) by mouth every 7 (seven) days.  . [DISCONTINUED] hydrOXYzine (ATARAX/VISTARIL) 25 MG tablet Take 1 tablet (25 mg total) by mouth 3 (three) times daily as needed.   No facility-administered encounter medications on file as of 06/27/2020.     Review of Systems  Review of Systems  Constitutional: Positive for fatigue. Negative for fever.  HENT: Negative.   Respiratory: Negative for cough and shortness of breath.   Cardiovascular: Negative.   Gastrointestinal: Negative.   Allergic/Immunologic: Negative.   Neurological: Negative.   Psychiatric/Behavioral: Positive for decreased concentration. The patient is nervous/anxious.        Physical Exam  BP (!) 141/84 (BP Location: Right Arm, Patient Position: Sitting)   Pulse 76   Temp (!) 97.3 F (36.3 C) (Skin)   Resp 16   Wt 235 lb (106.6 kg)   SpO2 100%   BMI 41.63 kg/m   Wt Readings from Last 5 Encounters:  06/27/20 235 lb (106.6 kg)  06/10/20 236 lb 3.2 oz (107.1 kg)  05/28/20 235 lb (106.6 kg)  03/06/20 232 lb 8 oz (105.5 kg)  02/28/20 228 lb 0.1 oz (103.4 kg)     Physical Exam Vitals and nursing note reviewed.  Constitutional:      General: She is not in acute distress.    Appearance: She is well-developed and well-nourished.  Cardiovascular:     Rate and Rhythm: Normal rate and regular rhythm.  Pulmonary:     Effort: Pulmonary effort is normal.     Breath sounds: Normal breath sounds.  Musculoskeletal:     Right lower leg: No edema.     Left lower leg: No edema.  Neurological:     Mental Status: She is alert and oriented to person, place, and time.  Psychiatric:        Mood and Affect: Mood and affect and mood normal.        Behavior: Behavior normal.       Assessment & Plan:   No problem-specific Assessment & Plan notes found for this encounter.     Ivonne Andrew, NP 06/27/2020

## 2020-06-27 NOTE — Therapy (Signed)
[place] [facility] SERVICES [address] [place], [place], [address] Phone: [phone]   Fax:  [phone]  Speech Language Pathology Treatment  Patient Details  Name: Catherine Berg MRN: [id number] Date of Birth: [birth year] Referring Provider (SLP): Lazaro Arms, NP   Encounter Date: 06/27/2020   End of Session - 06/27/20 1405    Visit Number 11    Number of Visits 17    Date for SLP Re-Evaluation 07/12/20    Authorization Type [employer]    Authorization Time Period 06/24/20    Authorization - Visit Number 1    Progress Note Due on Visit 10    SLP Start Time 1304    SLP Stop Time  1355    SLP Time Calculation (min) 51 min    Activity Tolerance Patient tolerated treatment well           Past Medical History:  Diagnosis Date  . Abnormal thyroid blood test   . Anemia   . Anxiety   . Asthma   . Complication of anesthesia    itching after c sections  . 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 Graves disease   . History of abnormal cervical Pap smear   . History of cervical polypectomy   . Hives 09/02/2015  . Hypertension   . IFG (impaired fasting glucose)   . Incidental lung nodule, > 59m and < 856m11/28/2018   4 mm RLL lung nodule on chest CT Mar 31, 2017  . Low serum vitamin D   . Obesity   . Pregnancy induced hypertension    with 1st pregnancy, normal pressure with 2nd  . Reflux   . Thyroid disease   . Vitamin B12 deficiency   . Vitamin D deficiency disease   . Wears dentures    full upper    Past Surgical History:  Procedure Laterality Date  . CERVICAL POLYPECTOMY    . CESAREAN SECTION     x 2  . COLONOSCOPY WITH PROPOFOL N/A 11/14/2018   Procedure: COLONOSCOPY WITH PROPOFOL;  Surgeon: TaVirgel ManifoldMD;  [facility]: [facility];  Laterality: N/A;  . ESOPHAGOGASTRODUODENOSCOPY (EGD) WITH PROPOFOL N/A 11/14/2018   Procedure: ESOPHAGOGASTRODUODENOSCOPY (EGD) WITH  PROPOFOL;  Surgeon: TaVirgel ManifoldMD;  [place] Service: Endoscopy;  Laterality: N/A;  Latex allergy  . GIVENS CAPSULE STUDY N/A 12/27/2018   Procedure: GIVENS CAPSULE STUDY;  Surgeon: TaVirgel ManifoldMD;  Location: [facility] ENDOSCOPY;  Service: Endoscopy;  Laterality: N/A;    There were no vitals filed for this visit.   Subjective Assessment - 06/27/20 1309    Subjective "Today I have already been gone all day."    Currently in Pain? No/denies                 ADULT SLP TREATMENT - 06/27/20 1309      General Information   Behavior/Cognition Alert;Cooperative    HPI Catherine Berg Bakers a [age].o. 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 Past medical history is noted for anxiety, asthma, Graves disease, HTN.      Treatment Provided   Treatment provided Cognitive-Linquistic      Pain Assessment   Pain Assessment No/denies pain      Cognitive-Linquistic Treatment   Treatment focused on Cognition;Patient/family/caregiver education    Skilled Treatment Patient reported better energy today. She was pleased she  was able to run several errands in a row today; got her oil changed, picked up something for her daughter, went to the [facility] in [place], and then came to her speech appointment. Was relieved another appointment this afternoon was cancelled; verbalized that this would have been "too much." Praised pt for this awareness; encouraged her to continue to monitor this and make adjustments in her day to avoid getting overwhelmed or fatigued. Pt reviewed appointment schedule for next week and requested to move an appointment so that she didn't have so much scheduled in one day. Reports household chores are still overwhelming at times. Pt initiated weekend chore list today, listing chores for each of her children and for herself, min cues to add details/specifics for  scheduling. For homework she did organize her mail; opened and sorted. Feels breaking tasks down into steps and setting time limits has been helpful.      Assessment / Recommendations / Plan   Plan Continue with current plan of care      Progression Toward Goals   Progression toward goals Progressing toward goals            SLP Education - 06/27/20 1405    Education Details use lists to prioritize, ok to re-evaluate and change the list    Person(s) Educated Patient    Methods Explanation    Comprehension Verbalized understanding            SLP Short Term Goals - 06/24/20 1300      SLP SHORT TERM GOAL #1   Title Patient will obtain a system for recall/executive function and bring to at least 3 therapy sessions.    Time 4    Period Weeks    Status Achieved    Target Date 06/12/20      SLP SHORT TERM GOAL #2   Title Patient will report no forgotten items at the grocery store using compensations for recall.    Time 4    Period Weeks    Status Achieved            SLP Long Term Goals - 06/27/20 1318      SLP LONG TERM GOAL #1   Title Patient will use external aids/strategies to run 2+ errands in a row.    Time 8    Period Weeks    Status Partially Met      SLP LONG TERM GOAL #2   Title Patient will report no missed medications or bills 4 visits in a row using memory compensations.    Time 8    Period Weeks    Status On-going      SLP LONG TERM GOAL #3   Title Patient will use compensations for attention/information processing to complete mod complex cognitive linguistic tasks >85% accuracy.    Time 8    Period Weeks    Status Partially Met            Plan - 06/27/20 1406    Clinical Impression Statement Anye continues to improve use of compensations at home, is improving awareness of need to manage energy conservation and scheduling tasks appropriately. She has met 2/2 STGs and is progressing toward LTGs. I recommend skilled ST to train pt in  compensations and strategies to improve her attention, recall and organization skills in order to improve function at work and home.    Speech Therapy Frequency 2x / week    Duration 8 weeks    Treatment/Interventions Environmental controls;Cueing hierarchy;SLP instruction  and feedback;Cognitive reorganization;Compensatory techniques;Functional tasks;Compensatory strategies;Internal/external aids;Patient/family education    Potential to Achieve Goals Good    Consulted and Agree with Plan of Care Patient           Patient will benefit from skilled therapeutic intervention in order to improve the following deficits and impairments:   Cognitive communication deficit  COVID-19 long hauler manifesting chronic concentration deficit    Problem List Patient Active Problem List   Diagnosis Date Noted  . Depression 06/27/2020  . Vitamin D deficiency 04/22/2020  . 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 History of COVID-19 02/28/2020  . 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 Memory loss 02/28/2020  . Physical deconditioning 02/28/2020  . Dyspnea on exertion 02/28/2020  . Neck pain, bilateral 02/28/2020  . Muscle spasms of neck 02/28/2020  . Gallstones 01/30/2020  . COVID-19 01/23/2020  . Arthralgia of hand 01/24/2019  . CRP elevated 01/24/2019  . ESR raised 01/24/2019  . Class 3 severe obesity due to excess calories without serious comorbidity in adult ([place]) 01/24/2019  . Iron deficiency anemia   . Palpitations 09/16/2018  . Coronary artery calcification 09/16/2018  . 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 Bilateral leg edema 04/20/2017  . Incidental lung nodule, > 3m and < 836m11/28/2018  . Hives 09/02/2015  . Morbid obesity ([facility]/13/2017  . Anxiety 07/09/2015  . 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 Thyroid nodule 02/01/2015  . Vitamin D deficiency disease   . Vitamin B12 deficiency   . History of abnormal cervical Pap smear   . Allergy-induced  asthma, mild intermittent, uncomplicated [id number]/[id number] MaDeneise LeverMSHanoverCCC-SLP Speech-Language Pathologist  MaAliene Altes/24/2022, 2:07 PM  [facility] RES[facility] Association[facility] [address][id number]: [phone] Fax:  [phone] Name: TaQUERIDA BERETTARN: [id number] of Birth: 01/11/19/1982

## 2020-06-27 NOTE — Patient Instructions (Signed)
COVID-19 long hauler manifesting chronic fatigue Shortness of breath:   Stay well hydrated  Stay active  Deep breathing exercises  May take tylenol or fever or pain   Fatigue Memory loss:  Continue PT  Order has been placed for speech - cognitive rehabilitation  Will order labs    Memory loss Olfactory Dysfunction:  Keep appointment with neurology    Follow up:  Follow up in 3 months if needed

## 2020-06-29 ENCOUNTER — Other Ambulatory Visit: Payer: Self-pay

## 2020-06-29 ENCOUNTER — Encounter: Payer: Self-pay | Admitting: Emergency Medicine

## 2020-06-29 ENCOUNTER — Ambulatory Visit
Admission: RE | Admit: 2020-06-29 | Discharge: 2020-06-29 | Disposition: A | Discharge status: Home / Self Care | Payer: Managed Care, Other (non HMO) | Source: Ambulatory Visit | Attending: Emergency Medicine | Admitting: Emergency Medicine

## 2020-06-29 ENCOUNTER — Ambulatory Visit
Admission: EM | Admit: 2020-06-29 | Discharge: 2020-06-29 | Disposition: A | Discharge status: Home / Self Care | Payer: Managed Care, Other (non HMO) | Attending: Emergency Medicine | Admitting: Emergency Medicine

## 2020-06-29 ENCOUNTER — Other Ambulatory Visit: Payer: Self-pay | Admitting: Family Medicine

## 2020-06-29 DIAGNOSIS — M79605 Pain in left leg: Secondary | ICD-10-CM | POA: Diagnosis not present

## 2020-06-29 DIAGNOSIS — M79604 Pain in right leg: Secondary | ICD-10-CM | POA: Insufficient documentation

## 2020-06-29 DIAGNOSIS — G5793 Unspecified mononeuropathy of bilateral lower limbs: Secondary | ICD-10-CM

## 2020-06-29 DIAGNOSIS — M7989 Other specified soft tissue disorders: Secondary | ICD-10-CM | POA: Insufficient documentation

## 2020-06-29 DIAGNOSIS — F5104 Psychophysiologic insomnia: Secondary | ICD-10-CM

## 2020-06-29 MED ORDER — TRAMADOL HCL 50 MG PO TABS: 50.0000 mg | ORAL_TABLET | Freq: Four times a day (QID) | ORAL | 0 refills | Status: DC | PRN

## 2020-06-29 NOTE — ED Provider Notes (Addendum)
[facility]    CSN: [id number] Arrival date & time: 06/29/20  0844      History   Chief Complaint Chief Complaint  Patient presents with  . Leg Pain    HPI ELLEY HARP is a 40 y.o. female.   HPI   40 year old female here for evaluation of bilateral lower extremity pain.  Patient reports that her pain started yesterday at around 4 PM.  She states that she typically can take her Lyrica and the pain resolves but this time it did not.  Patient also took a leftover tramadol that she had from a previous prescription and that did help her pain slightly but it did not resolve her pain.  The pain has been constant.  Patient denies numbness or tingling in her legs, any long car rides, air travel, chest pain, or shortness of breath.  Patient does not have a history of smoking.  Patient does have a Nexplanon in her left upper arm, she is obese and follows a sedentary lifestyle, she is also had a decreased intake of fluid.  Patient does not have a personal history of blood clots but there is a family history of blood clots.  Past Medical History:  Diagnosis Date  . Abnormal thyroid blood test   . Anemia   . Anxiety   . Asthma   . Complication of anesthesia    itching after c sections  . 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 Graves disease   . History of abnormal cervical Pap smear   . History of cervical polypectomy   . Hives 09/02/2015  . Hypertension   . IFG (impaired fasting glucose)   . Incidental lung nodule, > 22m and < 871m11/28/2018   4 mm RLL lung nodule on chest CT Mar 31, 2017  . Low serum vitamin D   . Obesity   . Pregnancy induced hypertension    with 1st pregnancy, normal pressure with 2nd  . Reflux   . Thyroid disease   . Vitamin B12 deficiency   . Vitamin D deficiency disease   . Wears dentures    full upper    Patient Active Problem List   Diagnosis Date Noted  . Depression 06/27/2020  . Vitamin D deficiency 04/22/2020   . 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 History of COVID-19 02/28/2020  . 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 Memory loss 02/28/2020  . Physical deconditioning 02/28/2020  . Dyspnea on exertion 02/28/2020  . Neck pain, bilateral 02/28/2020  . Muscle spasms of neck 02/28/2020  . Gallstones 01/30/2020  . COVID-19 01/23/2020  . Arthralgia of hand 01/24/2019  . CRP elevated 01/24/2019  . ESR raised 01/24/2019  . Class 3 severe obesity due to excess calories without serious comorbidity in adult ([facility]09/22/2020  . Iron deficiency anemia   . Palpitations 09/16/2018  . Coronary artery calcification 09/16/2018  . 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 Bilateral leg edema 04/20/2017  . Incidental lung nodule, > 71m44mnd < 8mm46m/28/2018  . Hives 09/02/2015  . Morbid obesity (H[facility])[place]/13/2017  . Anxiety 07/09/2015  . 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 Thyroid nodule 02/01/2015  . Vitamin D deficiency disease   . Vitamin B12 deficiency   . History of abnormal cervical Pap smear   . Allergy-induced asthma, mild intermittent, uncomplicated 06/035/70/1779Past Surgical History:  Procedure Laterality Date  . CERVICAL POLYPECTOMY    . CESAREAN SECTION  x 2  . COLONOSCOPY WITH PROPOFOL N/A 11/14/2018   Procedure: COLONOSCOPY WITH PROPOFOL;  Surgeon: Tahiliani, Varnita B, MD;  [facility];  Service: Endoscopy;  Laterality: N/A;  . ESOPHAGOGASTRODUODENOSCOPY (EGD) WITH PROPOFOL N/A 11/14/2018   Procedure: ESOPHAGOGASTRODUODENOSCOPY (EGD) WITH PROPOFOL;  Surgeon: Tahiliani, Varnita B, MD;  Location: [facility];  Service: Endoscopy;  Laterality: N/A;  Latex allergy  . GIVENS CAPSULE STUDY N/A 12/27/2018   Procedure: GIVENS CAPSULE STUDY;  Surgeon: Tahiliani, Varnita B, MD;  Location: [facility];  Service: Endoscopy;  Laterality: N/A;    OB History    Gravida  4   Para  2   Term  1   Preterm  1   AB      Living  2      SAB      IAB      Ectopic      Multiple      Live Births           Obstetric Comments  1st Menstrual Cycle:  10 1st Pregnancy:  23         Home Medications    Prior to Admission medications   Medication Sig Start Date End Date Taking? Authorizing Provider  amphetamine-dextroamphetamine (ADDERALL XR) 15 MG 24 hr capsule Take 1 capsule by mouth daily. 06/10/20  Yes Sowles, Krichna, MD  Cyanocobalamin (B-12) 1000 MCG SUBL Place 1 tablet under the tongue daily. 10/05/18  Yes Sowles, Krichna, MD  DULoxetine (CYMBALTA) 60 MG capsule Take 1 capsule (60 mg total) by mouth daily. First week take one after that two daily 05/06/20  Yes Sowles, Krichna, MD  Etonogestrel (NEXPLANON [place]) Inject into the skin.   Yes [provider]  famotidine (PEPCID) 20 MG tablet Take 1 tablet (20 mg total) by mouth daily. 04/20/19  Yes Tahiliani, Varnita B, MD  fluticasone (FLONASE) 50 MCG/ACT nasal spray Place 2 sprays into both nostrils daily. 01/05/19  Yes Sowles, Krichna, MD  hydrOXYzine (ATARAX/VISTARIL) 10 MG tablet Take 1 tablet (10 mg total) by mouth 3 (three) times daily as needed. 06/27/20  Yes Nichols, Tonya S, NP  levothyroxine (SYNTHROID) 50 MCG tablet Take 75 mcg by mouth daily. 11/13/18  Yes O'Connell, Thomas L, MD  montelukast (SINGULAIR) 10 MG tablet Take 1 tablet (10 mg total) by mouth at bedtime. 06/10/20  Yes Sowles, Krichna, MD  pregabalin (LYRICA) 75 MG capsule Take 1 capsule (75 mg total) by mouth at bedtime as needed. 06/10/20  Yes Sowles, Krichna, MD  tiZANidine (ZANAFLEX) 4 MG tablet Take 0.5-1.5 tablets (2-6 mg total) by mouth every 8 (eight) hours as needed for muscle spasms (muscle tightness). 09/12/19  Yes Tapia, Leisa, PA-C  traZODone (DESYREL) 50 MG tablet Take 0.5-1 tablets (25-50 mg total) by mouth at bedtime as needed for sleep. 05/06/20  Yes Sowles, Krichna, MD  Vitamin D, Ergocalciferol, (DRISDOL) 1.25 MG (50000 UT) CAPS capsule Take 1 capsule (50,000 Units total) by mouth every  7 (seven) days. 09/14/18  Yes Sowles, Krichna, MD  albuterol (PROVENTIL HFA;VENTOLIN HFA) 108 (90 Base) MCG/ACT inhaler Inhale 2 puffs into the lungs every 6 (six) hours as needed for wheezing or shortness of breath. 06/28/18   Ramachandran, Pradeep, MD  clobetasol (TEMOVATE) 0.05 % external solution Apply 1 application topically 2 (two) times daily. 10/31/19   Sowles, Krichna, MD  Elastic Bandages & Supports (MEDICAL COMPRESSION STOCKINGS) MISC Wear from morning to night, but do not sleep in these 04/20/17   Lada, Melinda P, MD      Family History Family History  Problem Relation Age of Onset  . Heart attack Mother 45  . Hypertension Mother   . Asthma Mother   . Cancer Mother        laryngeal  . Diabetes Mother        pre diabetic  . Heart disease Mother   . Mental illness Mother   . Alcohol abuse Mother   . Drug abuse Mother   . Depression Mother   . Anxiety disorder Mother   . Bipolar disorder Mother   . Learning disabilities Brother   . ADD / ADHD Brother   . Asthma Son   . Hyperlipidemia Brother   . Alcohol abuse Father   . Heart disease Maternal Grandmother        heart attack, pacemaker  . Heart attack Maternal Grandmother   . Hypertension Maternal Grandmother   . Hypertension Maternal Grandfather   . Heart disease Paternal Grandmother        couple of major open heart surgeries, leaking valves  . Hypertension Paternal Grandmother   . Hypertension Paternal Grandfather   . Breast cancer Neg Hx     Social History Social History   Tobacco Use  . Smoking status: Never Smoker  . Smokeless tobacco: Never Used  Vaping Use  . Vaping Use: Never used  Substance Use Topics  . Alcohol use: No    Alcohol/week: 0.0 standard drinks  . Drug use: No     Allergies   Latex and Shellfish allergy   Review of Systems Review of Systems  Constitutional: Negative for activity change and fever.  Respiratory: Negative for shortness of breath.   Cardiovascular: Positive for leg  swelling. Negative for chest pain.  Musculoskeletal: Positive for myalgias.  Skin: Negative for color change.  Neurological: Negative for weakness and numbness.  Hematological: Negative.   Psychiatric/Behavioral: Negative.      Physical Exam Triage Vital Signs ED Triage Vitals [06/29/20 0853]  Enc Vitals Group     BP      Pulse      Resp      Temp      Temp src      SpO2      Weight 235 lb (106.6 kg)     Height 5' 3" (1.6 m)     Head Circumference      Peak Flow      Pain Score 5     Pain Loc      Pain Edu?      Excl. in [place]?    No data found.  Updated Vital Signs BP 135/87 (BP Location: Right Arm)   Pulse 70   Temp 98.1 F (36.7 C) (Oral)   Resp 14   Ht 5' 3" (1.6 m)   Wt 235 lb (106.6 kg)   SpO2 100%   BMI 41.63 kg/m   Visual Acuity Right Eye Distance:   Left Eye Distance:   Bilateral Distance:    Right Eye Near:   Left Eye Near:    Bilateral Near:     Physical Exam Vitals and nursing note reviewed.  Constitutional:      General: She is not in acute distress.    Appearance: Normal appearance. She is not ill-appearing.  HENT:     Head: Normocephalic and atraumatic.  Cardiovascular:     Rate and Rhythm: Normal rate and regular rhythm.     Pulses: Normal pulses.     Heart sounds: Normal heart sounds. No murmur  heard. No gallop.   Pulmonary:     Effort: Pulmonary effort is normal.     Breath sounds: Normal breath sounds. No wheezing, rhonchi or rales.  Musculoskeletal:        General: Tenderness present. No swelling.  Skin:    General: Skin is warm and dry.     Capillary Refill: Capillary refill takes less than 2 seconds.     Findings: No bruising.  Neurological:     General: No focal deficit present.     Mental Status: She is alert and oriented to person, place, and time.  Psychiatric:        Mood and Affect: Mood normal.        Behavior: Behavior normal.        Thought Content: Thought content normal.        Judgment: Judgment normal.       UC Treatments / Results  Labs (all labs ordered are listed, but only abnormal results are displayed) Labs Reviewed - No data to display  EKG   Radiology No results found.  Procedures Procedures (including critical care time)  Medications Ordered in UC Medications - No data to display  Initial Impression / Assessment and Plan / UC Course  I have reviewed the triage vital signs and the nursing notes.  Pertinent labs & imaging results that were available during my care of the patient were reviewed by me and considered in my medical decision making (see chart for details).   Patient is a very pleasant 40 year old female who is here for evaluation of bilateral lower extremity pain.  Patient reports that she feels like her right lower extremity is swollen and it does visually appear to be swollen.  Physical exam lungs are clear to auscultation all fields with normal heart sounds. Mild calf tenderness on the right but not on the left.  Negative Homans' sign bilaterally.  Bilateral calf circumference are 43 cm and equal.  DP and PT pulses 2+ bilaterally.  Patient is obese, uses hormonal birth control, follows a sedentary lifestyle, has a recent history of COVID with long-haul complications, and a history of elevated inflammatory markers.  Patient does not smoke and has not had any long car rides or air travel.  There is definitely concern for DVT formation versus musculoskeletal pain secondary to deconditioning.  Will obtain bilateral venous Doppler to look for DVT at [facility].  11:19 AM: Patient's ultrasound was negative for DVT.  Spoke with patient and relayed those results to her after verifying her identity through name and date of birth.  Patient vies to apply moist heat to her lower extremities for 20 minutes at a time 2-3 times a day to improve blood flow and help improve circulation which may aid in pain relief, improve her oral fluid intake, get a massage, and stretch her lower  extremities.  Will give tramadol to assist in pain relief as patient has used this before with some success.  Final Clinical Impressions(s) / UC Diagnoses   Final diagnoses:  Bilateral leg pain     Discharge Instructions     These go to the medical mall at [facility] to have an ultrasound of your lower extremities to look for a blood clot.    ED Prescriptions    None     PDMP not reviewed this encounter.   Margarette Canada, NP 06/29/20 5003    Margarette Canada, NP 06/29/20 1124

## 2020-06-29 NOTE — Discharge Instructions (Addendum)
These go to the medical mall at [facility] to have an ultrasound of your lower extremities to look for a blood clot.  11:19 AM: Your ultrasound was negative for DVT.  Your pain may very well be related to musculoskeletal issues.  Apply moist heat to your lower legs for 20 minutes at a time 2-3 times a day to help improve blood flow and increase circulation to pump out any lactic acid which may be causing the pain.  Massage therapy can also be effective in helping to remove the lactic acid from the area and soothe muscle irritation.  Increase your oral fluid intake to improve hydration and therefore allow your body better ability to clear lactic acid from your muscles which may be causing the pain.  Use the tramadol as needed for pain.

## 2020-06-29 NOTE — ED Triage Notes (Signed)
Patient c/o bilateral lower leg pain that started yesterday.  Patient states that she tried Tramadol but had no improvement in the pain. Patient denies injury or fall.

## 2020-07-01 ENCOUNTER — Ambulatory Visit: Payer: Managed Care, Other (non HMO) | Admitting: Speech Pathology

## 2020-07-01 ENCOUNTER — Other Ambulatory Visit: Payer: Self-pay

## 2020-07-01 DIAGNOSIS — G5793 Unspecified mononeuropathy of bilateral lower limbs: Secondary | ICD-10-CM

## 2020-07-01 DIAGNOSIS — R41840 Attention and concentration deficit: Secondary | ICD-10-CM

## 2020-07-01 DIAGNOSIS — R41841 Cognitive communication deficit: Secondary | ICD-10-CM

## 2020-07-01 DIAGNOSIS — F5104 Psychophysiologic insomnia: Secondary | ICD-10-CM

## 2020-07-01 DIAGNOSIS — U099 Post covid-19 condition, unspecified: Secondary | ICD-10-CM

## 2020-07-01 MED ORDER — PREGABALIN 75 MG PO CAPS: 75.0000 mg | ORAL_CAPSULE | Freq: Every evening | ORAL | 0 refills | Status: DC | PRN

## 2020-07-01 NOTE — Therapy (Signed)
[place] MAIN [facility] SERVICES [address] [place], [place], [address] Phone: [phone]   Fax:  [phone]  Speech Language Pathology Treatment  Patient Details  Name: Catherine Berg MRN: [id number] Date of Birth: 10/31/1980 Referring Provider (SLP): Lazaro Arms, NP   Encounter Date: 07/01/2020   End of Session - 07/01/20 1138    Visit Number 12    Number of Visits 17    Date for SLP Re-Evaluation 09/29/20    Authorization Type [employer]    Authorization Time Period 06/24/20    Authorization - Visit Number 1    Progress Note Due on Visit 10    SLP Start Time 1110    SLP Stop Time  1155    SLP Time Calculation (min) 45 min    Activity Tolerance Patient tolerated treatment well           Past Medical History:  Diagnosis Date  . Abnormal thyroid blood test   . Anemia   . Anxiety   . Asthma   . Complication of anesthesia    itching after c sections  . 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 Graves disease   . History of abnormal cervical Pap smear   . History of cervical polypectomy   . Hives 09/02/2015  . Hypertension   . IFG (impaired fasting glucose)   . Incidental lung nodule, > 92mm and < 33mm 03/31/2017   4 mm RLL lung nodule on chest CT Mar 31, 2017  . Low serum vitamin D   . Obesity   . Pregnancy induced hypertension    with 1st pregnancy, normal pressure with 2nd  . Reflux   . Thyroid disease   . Vitamin B12 deficiency   . Vitamin D deficiency disease   . Wears dentures    full upper    Past Surgical History:  Procedure Laterality Date  . CERVICAL POLYPECTOMY    . CESAREAN SECTION     x 2  . COLONOSCOPY WITH PROPOFOL N/A 11/14/2018   Procedure: COLONOSCOPY WITH PROPOFOL;  Surgeon: Virgel Manifold, MD;  Location: [place];  Service: Endoscopy;  Laterality: N/A;  . ESOPHAGOGASTRODUODENOSCOPY (EGD) WITH PROPOFOL N/A 11/14/2018   Procedure: ESOPHAGOGASTRODUODENOSCOPY (EGD) WITH  PROPOFOL;  Surgeon: Virgel Manifold, MD;  Location: [facility];  Service: Endoscopy;  Laterality: N/A;  Latex allergy  . GIVENS CAPSULE STUDY N/A 12/27/2018   Procedure: GIVENS CAPSULE STUDY;  Surgeon: Virgel Manifold, MD;  Location: [facility];  Service: Endoscopy;  Laterality: N/A;    There were no vitals filed for this visit.   Subjective Assessment - 07/01/20 1114    Subjective "I can not remember where we left off last week."                 ADULT SLP TREATMENT - 07/01/20 1228      General Information   Behavior/Cognition Alert;Cooperative    HPI Catherine Berg is a 40 y.o. 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 Past medical history is noted for anxiety, asthma, Graves disease, HTN.      Treatment Provided   Treatment provided Cognitive-Linquistic      Pain Assessment   Pain Assessment No/denies pain      Cognitive-Linquistic Treatment   Treatment focused on Cognition;Patient/family/caregiver education    Skilled Treatment Patient reported using chore list and sharing with her kids helped her feel less  overwhelmed, and she was able to accomplish her weekend tasks. With min question cue, pt ID'd an appropriate strategy to ensure follow-through with this routine of preplanning. Pt reported she went by her workplace and felt overwhelmed by conversations with her coworkers, answering questions. SLP educated on preplanning to reduce time pressure and being overwhelmed cognitively. Pt referenced needing to follow up on some tasks; set reminders for herself with min question cue. Discussed goals and updated these with new patient goals, including conversation-based goals. When taking notes or setting reminders, pt's conversation was hesitating and she had wordfinding difficulties. Educated on communicating her need to use a strategy to allow her to single-task and reduce pressure  in the conversation. Pt did this with mod cue later in session, and subsequently without cue x1 by end of session. Targeted use of strategies for processing, such as requesting repetition, rephrasing, and note taking.      Assessment / Recommendations / Plan   Plan Goals updated      Progression Toward Goals   Progression toward goals Progressing toward goals            SLP Education - 07/01/20 1229    Education Details conversation strategies, self-advocating    Person(s) Educated Patient    Methods Explanation    Comprehension Verbalized understanding            SLP Short Term Goals - 07/01/20 1236      SLP SHORT TERM GOAL #1   Title Patient will obtain a system for recall/executive function and bring to at least 3 therapy sessions.    Time 4    Period Weeks    Status Achieved    Target Date 06/12/20      SLP SHORT TERM GOAL #2   Title Patient will report no forgotten items at the grocery store using compensations for recall.    Time 4    Period Weeks    Status Achieved            SLP Long Term Goals - 07/01/20 1153      SLP LONG TERM GOAL #1   Title Patient will use external aids/strategies to run 2+ errands in a row.    Time 8    Period Weeks    Status Partially Met      SLP LONG TERM GOAL #2   Title Patient will report no missed medications or bills 4 visits in a row using memory compensations.    Time 8    Period Weeks    Status On-going      SLP LONG TERM GOAL #3   Title Patient will use compensations for attention/information processing to complete mod complex cognitive linguistic tasks >85% accuracy.    Time 8    Period Weeks    Status Partially Met      SLP LONG TERM GOAL #4   Title Patient will improve communication function by self-advocating for accomodations/strategies in conversations outside ST room.    Time 8    Period Weeks    Status New      SLP LONG TERM GOAL #5   Title Patient will use strategies for processing verbal  information (rephrasing, active listening, taking notes, request repetition) in moderately complex conversation for >90% comprehension of details.    Time 8    Period Weeks    Status New            Plan - 07/01/20 1230    Clinical Impression Statement Catherine Berg  continues to improve use of compensations at home, is improving awareness of need to manage energy conservation and scheduling tasks appropriately. She has met 2/2 STGs and is progressing toward LTGs. Goals updated today due to pt reporting/exhibiting difficulties in conversations. I recommend skilled ST to train pt in compensations and strategies to improve her attention, recall and organization skills in order to improve function at work and home.    Speech Therapy Frequency 2x / week    Duration 8 weeks    Treatment/Interventions Environmental controls;Cueing hierarchy;SLP instruction and feedback;Cognitive reorganization;Compensatory techniques;Functional tasks;Compensatory strategies;Internal/external aids;Patient/family education    Potential to Achieve Goals Good    Consulted and Agree with Plan of Care Patient           Patient will benefit from skilled therapeutic intervention in order to improve the following deficits and impairments:   Cognitive communication deficit  COVID-19 long hauler manifesting chronic concentration deficit    Problem List Patient Active Problem List   Diagnosis Date Noted  . Depression 06/27/2020  . Vitamin D deficiency 04/22/2020  . 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 History of COVID-19 02/28/2020  . 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 Memory loss 02/28/2020  . Physical deconditioning 02/28/2020  . Dyspnea on exertion 02/28/2020  . Neck pain, bilateral 02/28/2020  . Muscle spasms of neck 02/28/2020  . Gallstones 01/30/2020  . COVID-19 01/23/2020  . Arthralgia of hand 01/24/2019  . CRP elevated 01/24/2019  . ESR raised 01/24/2019  . Class 3 severe obesity  due to excess calories without serious comorbidity in adult ([place]) 01/24/2019  . Iron deficiency anemia   . Palpitations 09/16/2018  . Coronary artery calcification 09/16/2018  . 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 Bilateral leg edema 04/20/2017  . Incidental lung nodule, > 33m and < 819m11/28/2018  . Hives 09/02/2015  . Morbid obesity (HCElk04/13/2017  . Anxiety 07/09/2015  . 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 Thyroid nodule 02/01/2015  . Vitamin D deficiency disease   . Vitamin B12 deficiency   . History of abnormal cervical Pap smear   . Allergy-induced asthma, mild intermittent, uncomplicated [id number] MaDeneise LeverMSBerkleyCCC-SLP Speech-Language Pathologist  MaAliene Altes/28/2022, 12:37 PM  [place] [facility] [address] [place]hone: [phone] Fax:  [phone] Name: Catherine MONRROYRN: [id number] of Birth: 9/Mar 01, 1981

## 2020-07-01 NOTE — Patient Instructions (Addendum)
Come up with a list of questions that others might ask that you feel would overwhelm you. Write down some simple ways you could respond.   Try not to "fray" your focus. If you need to write something down or set an alarm on your phone, use your words to communicate this with your listener. "Self-advocate" to help you reduce time stress/pressure.  Don't be afraid to ask people to repeat themselves, or to summarize what you did understand and say "I understood x, but I missed something." This makes you look engaged, not "stupid." Take notes to help you remember details from conversations and appointments.   See if you can dig up that bill book. Once you are caught up, for maintenance consider dividing up bills to pay over a couple of regularly scheduled days, set an alarm in your phone if this helps.

## 2020-07-02 ENCOUNTER — Encounter: Payer: Self-pay | Admitting: Family Medicine

## 2020-07-02 DIAGNOSIS — U099 Post covid-19 condition, unspecified: Secondary | ICD-10-CM | POA: Insufficient documentation

## 2020-07-02 DIAGNOSIS — G9331 Postviral fatigue syndrome: Secondary | ICD-10-CM | POA: Insufficient documentation

## 2020-07-02 DIAGNOSIS — R0601 Orthopnea: Secondary | ICD-10-CM | POA: Insufficient documentation

## 2020-07-02 DIAGNOSIS — G933 Postviral fatigue syndrome: Secondary | ICD-10-CM | POA: Insufficient documentation

## 2020-07-02 DIAGNOSIS — R430 Anosmia: Secondary | ICD-10-CM | POA: Insufficient documentation

## 2020-07-02 NOTE — Procedures (Signed)
[facility]  HOME SLEEP TEST (Watch PAT)  STUDY DOWNLOAD: 06/25/20  DOB: 11-06-1980  MRN: [id number]  ORDERING CLINICIAN: Melvyn Novas, MD   REFERRING CLINICIANAlba Cory, MD   CLINICAL INFORMATION/HISTORY: Catherine Berg a 40 y.o.  African- American female patientand seen here upon referralon 05/28/2020 from Angus Seller, NP, at the [facility].  Chiefconcernaccording to patient : " I worked at [employer] when I contracted Covid 19, in September 2021." She was not vaccinated, and got severely ill. She avoided hospitalization by being treated with steroids and oxygen , she didn't agree to monoclonal Ab treatments yet she had SOB, and extreme myalgia, fatigue. She sleeps elevated.  Her nephew and son ( 58 and 29 years old ) ended up with COVID but recovered easier and completely.  She remains fatigued, with near syncope spells and loss of smell and taste and she is highly anxious.  She sleeps only with medication and still only 4-5 hours, waking up at 2-3 AM, she can't take naps. She feels hypervigilant and worried about health. She is still using her pulse ox and she noted high heart rates intermittently , oxygen stays in the 90s.  She has a past medical history of Abnormal thyroid blood test, Anemia, Anxiety, Asthma post COVID, COVID 19 without pneumonia, buy ageusia and anosmnia, Complication of anesthesia, Elevated serum glutamic pyruvic transaminase (SGPT) level, Graves disease, History of abnormal cervical Pap smear, History of cervical polypectomy, Hypertension, IFG (impaired fasting glucose), Incidental lung nodule, > 64mm and < 31mm (03/31/2017), Low  vitamin D, Obesity, Pregnancy induced hypertension and GERD/ Reflux, Thyroid disease, Vitamin B12 deficiency. Epworth sleepiness score: 13/24. BMI: 41.8 kg/m Neck Circumference: 15.5  "  FINDINGS:  Total Record Time (hours, min): 7 h 31 min Total Sleep Time (hours, min):  6 h 13 min   Percent  REM (%):    34.40 %   Calculated pAHI (per hour):  0       REM pAHI:   0    NREM pAHI: 0  Supine AHI: 0   Oxygen Saturation (%) Mean: 96  Minimum oxygen saturation (%):        94   O2 Saturation Range (%): 94-98  O2Saturation (minutes) <=88%: 0 min  Pulse Mean (bpm):    79  Pulse Range (57-108)   IMPRESSION: The very unexpected finding of this HST was there was no evidence of  OSA (obstructive sleep apnea) sufficient sleep time, normal oxygenation and normal heart rate were noted.   RECOMMENDATION:  This HST found no tachycardia, hypoxia or apnea.    INTERPRETING PHYSICIAN:  Melvyn Novas, MD  [facility] Board certified by [employer] and  Fellow of the [employer] of [employer]. Medical Director of [employer].

## 2020-07-02 NOTE — Progress Notes (Signed)
IMPRESSION: The very unexpected finding of this HST was that there was no evidence of OSA (obstructive sleep apnea) while recording for a sufficient sleep time, with normal oxygenation and normal heart rate noted.   RECOMMENDATION:  This HST found no tachycardia, hypoxia or apnea. Could this be anxiety ?

## 2020-07-03 ENCOUNTER — Ambulatory Visit: Payer: Managed Care, Other (non HMO)

## 2020-07-03 ENCOUNTER — Ambulatory Visit (LOCAL_COMMUNITY_HEALTH_CENTER): Payer: Managed Care, Other (non HMO) | Admitting: Family Medicine

## 2020-07-03 ENCOUNTER — Encounter: Payer: Self-pay | Admitting: Family Medicine

## 2020-07-03 ENCOUNTER — Telehealth: Payer: Self-pay | Admitting: Nurse Practitioner

## 2020-07-03 ENCOUNTER — Ambulatory Visit: Payer: Managed Care, Other (non HMO) | Attending: Nurse Practitioner | Admitting: Speech Pathology

## 2020-07-03 ENCOUNTER — Other Ambulatory Visit: Payer: Self-pay

## 2020-07-03 VITALS — Ht 63.0 in | Wt 237.4 lb

## 2020-07-03 DIAGNOSIS — R41841 Cognitive communication deficit: Secondary | ICD-10-CM | POA: Diagnosis not present

## 2020-07-03 DIAGNOSIS — U099 Post covid-19 condition, unspecified: Secondary | ICD-10-CM | POA: Diagnosis present

## 2020-07-03 DIAGNOSIS — Z0000 Encounter for general adult medical examination without abnormal findings: Secondary | ICD-10-CM

## 2020-07-03 DIAGNOSIS — Z3009 Encounter for other general counseling and advice on contraception: Secondary | ICD-10-CM | POA: Diagnosis not present

## 2020-07-03 DIAGNOSIS — R41840 Attention and concentration deficit: Secondary | ICD-10-CM | POA: Insufficient documentation

## 2020-07-03 NOTE — Patient Instructions (Signed)
In a conversation, if you feel "all over the place", ask the other person for a couple of minutes to regroup.   If you have an important conversation coming up, or a doctor's appointment: Make a list of your talking points and preplan some of how you think the conversation will go. Sometimes just getting your thoughts down in advance will help you organize them, but you can reference the list during your conversation if you need to. If you feel anxious about doing this, try saying to the person: "I'm going to use my phone because I took down some notes to help me remember what I wanted to talk to you about."   Slow down! Take 5 deep breaths.   The moment you remember something, make a note right away. (No "I'll remember to remember that.")  When something unexpected happens in your day, use your schedule to help you "regroup." Add the unplanned event into your schedule, and move what you had scheduled to a different time. If you need to reprioritize and move something to another day, take the time to do that.

## 2020-07-03 NOTE — Progress Notes (Addendum)
Patient here for PE and desires Nexplanon removed. Nexplanon placed 06/01/2017. Patient counseled Nexplanon good for 4 years. Patient indicated willingness to keep Nexplanon for another year after talking with provider. Last PE 2019. Last Pap11/24/ 2020, NIL, HPV negative.Marland KitchenMarland KitchenBurt Knack, RN

## 2020-07-03 NOTE — Progress Notes (Signed)
Family Planning Visit- Repeat Yearly Visit  Subjective:  Catherine Berg is a 40 y.o. [phone]  being seen today for an well woman visit and to discuss family planning options.    She is currently using Nexplanon for pregnancy prevention. Patient reports she does not want a pregnancy in the next year. Patient  has Vitamin D deficiency disease; Vitamin B12 deficiency; History of abnormal cervical Pap smear; Thyroid nodule; Allergy-induced asthma, mild intermittent, uncomplicated; 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; Anxiety; Morbid obesity (Catherine Berg); Hives; Incidental lung nodule, > 59m and < 828m Bilateral leg edema; Palpitations; Coronary artery calcification; Postablative hypothyroidism; Iron deficiency anemia; Arthralgia of hand; CRP elevated; ESR raised; Class 3 severe obesity due to excess calories without serious comorbidity in adult ([facility] COVID-[facility]; Gallstones; History of COVID-19; Olfactory impairment; Memory loss; Physical deconditioning; Dyspnea on exertion; Neck pain, bilateral; Muscle spasms of neck; Vitamin D deficiency; COVID-19 long hauler manifesting chronic fatigue; Depression; Anosmia; Sleeps in sitting position due to orthopnea; Postviral fatigue syndrome; and COVID-19 long hauler manifesting chronic dyspnea on their problem list.  Chief Complaint  Patient presents with  . Annual Exam  . Contraception    Patient reports here for physical and nexplanon removal.    Patient denies and problems or concerns  See flowsheet for other program required questions.   Body mass index is 42.05 kg/m. - Patient is eligible for diabetes screening based on BMI and age >4>83 yes HA1C ordered? No patient reports has appointment with PCP on 07/10/20 and will have labs completed at that time.    Patient reports 1 of partners in last year. Desires STI screening?  No - declined    Has patient been screened once for HCV in the past?  Yes   Lab Results  Component Value Date   HCVAB NEGATIVE 01/08/2016     Does the patient have current of drug use, have a partner with drug use, and/or has been incarcerated since last result? No  If yes-- [facility]   Does the patient meet criteria for HBV testing? No  Criteria:  -Household, sexual or needle sharing contact with HBV -History of drug use -HIV positive -Those with known Hep C   Health Maintenance Due  Topic Date Due  . COVID-19 Vaccine (1) Never done    ROS  The following portions of the patient's history were reviewed and updated as appropriate: allergies, current medications, past family history, past medical history, past social history, past surgical history and problem list. Problem list updated.  Objective:   Vitals:   07/03/20 1434  Weight: 237 lb 6.4 oz (107.7 kg)  Height: _0  (1.6 m)    Physical Exam Vitals and nursing note reviewed.  Constitutional:      Appearance: Normal appearance.  HENT:     Head: Normocephalic and atraumatic.     Mouth/Throat:     Mouth: Mucous membranes are moist.     Pharynx: No oropharyngeal exudate or posterior oropharyngeal erythema.  Eyes:     General: No scleral icterus. Neck:     Thyroid: No thyroid mass, thyromegaly or thyroid tenderness.  Cardiovascular:     Rate and Rhythm: Normal rate.     Pulses: Normal pulses.  Pulmonary:     Effort: Pulmonary effort is normal.  Chest:  Breasts:     Tanner Score is 5.     Right: Normal. No swelling, mass, nipple discharge, skin change or tenderness.     Left: Normal. No  swelling, mass, nipple discharge, skin change or tenderness.    Abdominal:     General: Abdomen is flat. Bowel sounds are normal.     Palpations: Abdomen is soft.  Genitourinary:    Comments: Deferred  Musculoskeletal:        General: Normal range of motion.     Cervical back: Normal range of motion and neck supple.  Skin:    General: Skin is warm and dry.  Neurological:     General: No focal deficit present.     Mental Status:  She is alert and oriented to person, place, and time.  Psychiatric:        Mood and Affect: Mood normal.        Behavior: Behavior normal.       Assessment and Plan:  RITA VIALPANDO is a 40 y.o. female [phone] presenting to the [facility] Department for an yearly well woman exam/family planning visit  Contraception counseling: Reviewed all forms of birth control options in the tiered based approach. available including abstinence; over the counter/barrier methods; hormonal contraceptive medication including pill, patch, ring, injection,contraceptive implant, ECP; hormonal and nonhormonal IUDs; permanent sterilization options including vasectomy and the various tubal sterilization modalities. Risks, benefits, and typical effectiveness rates were reviewed.  Questions were answered.  Written information was also given to the patient to review.  Patient desires to keep nexplanon for the additional year. She will follow up in 1 year for surveillance and removal.  She was told to call with any further questions, or with any concerns about this method of contraception.  Emphasized use of condoms 100% of the time for STI prevention.  Patient was not offered ECP based on current BCM.   1. Routine general medical examination at a health care facility 2. Family planning counseling Patient here for well woman exam, CBE completed today.  PAP due 2023.   Discussed with patient about nexplanon doesn't expire for 4 years.  Patient elects to keep nexplanon for the additional year.    Discusses patient PHQ 9 score of 10.  Today is 6 years since pt. Mother's sudden death.  Patient reports having a meeting with a therapist next week, does report that depression has increased and happens more frequently.  Patient denies plan or method of self harm or harm to others.    Discussed with patient about concerns of feeling dizzy, shakiness and weight gain.  Encourage to eat small frequent meals and increase  protein intake.  Patient elects to have A1C checked with PCP.  Encouraged patient to discuss concerns with PCP if still having these symptoms with increasing meals and protein.   Patient verbalized understanding.    Return in about 1 year (around 07/03/2021) for annual well woman exam, nexplanon removal .  Future Appointments  Date Time Provider [place]  07/09/2020 11:00 AM Aliene Altes, CCC-SLP [facility] None  07/11/2020 11:00 AM Aliene Altes, CCC-SLP [facility] None  07/15/2020 11:00 AM Aliene Altes, CCC-SLP [facility] None  07/17/2020  8:40 AM Steele Sizer, MD [place] [facility]  07/17/2020 11:00 AM Aliene Altes, CCC-SLP [facility] None  07/22/2020 11:00 AM Aliene Altes, CCC-SLP [facility] None  07/22/2020  2:00 PM Norman Clay, MD [facility] None  07/24/2020 11:00 AM Aliene Altes, CCC-SLP [facility] None  09/24/2020 10:00 AM [facility] None    Junious Dresser, FNP

## 2020-07-03 NOTE — Therapy (Signed)
[place] MAIN [facility] SERVICES [address] [place], [place], [address] Phone: [phone]   Fax:  [phone]  Speech Language Pathology Treatment  Patient Details  Name: Catherine Berg MRN: [id number] Date of Birth: 06/16/1980 Referring Provider (SLP): Lazaro Arms, NP   Encounter Date: 07/03/2020   End of Session - 07/03/20 1249    Visit Number 13    Number of Visits 17    Date for SLP Re-Evaluation 09/29/20    Authorization Type [employer]    Authorization Time Period 06/24/20    Authorization - Visit Number 3    Progress Note Due on Visit 10    SLP Start Time 1114    SLP Stop Time  1200    SLP Time Calculation (min) 46 min    Activity Tolerance Patient tolerated treatment well           Past Medical History:  Diagnosis Date  . Abnormal thyroid blood test   . Anemia   . Anxiety   . Asthma   . Complication of anesthesia    itching after c sections  . 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 Graves disease   . History of abnormal cervical Pap smear   . History of cervical polypectomy   . Hives 09/02/2015  . Hypertension   . IFG (impaired fasting glucose)   . Incidental lung nodule, > 58m and < 849m11/28/2018   4 mm RLL lung nodule on chest CT Mar 31, 2017  . Low serum vitamin D   . Obesity   . Pregnancy induced hypertension    with 1st pregnancy, normal pressure with 2nd  . Reflux   . Thyroid disease   . Vitamin B12 deficiency   . Vitamin D deficiency disease   . Wears dentures    full upper    Past Surgical History:  Procedure Laterality Date  . CERVICAL POLYPECTOMY    . CESAREAN SECTION     x 2  . COLONOSCOPY WITH PROPOFOL N/A 11/14/2018   Procedure: COLONOSCOPY WITH PROPOFOL;  Surgeon: TaVirgel ManifoldMD;  [place];  Laterality: N/A;  . ESOPHAGOGASTRODUODENOSCOPY (EGD) WITH PROPOFOL N/A 11/14/2018   Procedure: ESOPHAGOGASTRODUODENOSCOPY (EGD) WITH  PROPOFOL;  Surgeon: TaVirgel ManifoldMD;  [place]: [place];  Laterality: N/A;  Latex allergy  . GIVENS CAPSULE STUDY N/A 12/27/2018   Procedure: GIVENS CAPSULE STUDY;  Surgeon: TaVirgel ManifoldMD;  Location: [facility] ENDOSCOPY;  Service: Endoscopy;  Laterality: N/A;    There were no vitals filed for this visit.   Subjective Assessment - 07/03/20 1121    Subjective "I was a little jittery yesterday."                 ADULT SLP TREATMENT - 07/03/20 1244      General Information   Behavior/Cognition Alert;Cooperative    HPI Catherine L. HaAldean Bakers a [age].o. 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 Past medical history is noted for anxiety, asthma, Graves disease, HTN.      Treatment Provided   Treatment provided Cognitive-Linquistic      Pain Assessment   Pain Assessment No/denies pain      Cognitive-Linquistic Treatment   Treatment focused on Cognition;Patient/family/caregiver education    Skilled Treatment SLP focused session on pt compensations for communicating as she reported thought disorganization, forgetting to mention details or  ask questions in conversations. Educated on pre-planning by making a list of discussion topics ahead of time, as well as asking her communication partner for a quick break to regroup if she needs to gather her thoughts. Patient reported challenges at times with dealing with unplanned events; we discussed modifying her daily schedule and making changes in the moment to help her organize her day and to reduce feelings of being overwhelmed. During conversation with SLP today, patient used notetaking to help her retain details and requested clarification with modified independence.      Assessment / Recommendations / Plan   Plan Continue with current plan of care      Progression Toward Goals   Progression toward goals Progressing toward  goals            SLP Education - 07/03/20 1249    Education Details pre-planning for conversations    Person(s) Educated Patient    Methods Explanation    Comprehension Verbalized understanding            SLP Short Term Goals - 07/01/20 1236      SLP SHORT TERM GOAL #1   Title Patient will obtain a system for recall/executive function and bring to at least 3 therapy sessions.    Time 4    Period Weeks    Status Achieved    Target Date 06/12/20      SLP SHORT TERM GOAL #2   Title Patient will report no forgotten items at the grocery store using compensations for recall.    Time 4    Period Weeks    Status Achieved            SLP Long Term Goals - 07/01/20 1153      SLP LONG TERM GOAL #1   Title Patient will use external aids/strategies to run 2+ errands in a row.    Time 8    Period Weeks    Status Partially Met      SLP LONG TERM GOAL #2   Title Patient will report no missed medications or bills 4 visits in a row using memory compensations.    Time 8    Period Weeks    Status On-going      SLP LONG TERM GOAL #3   Title Patient will use compensations for attention/information processing to complete mod complex cognitive linguistic tasks >85% accuracy.    Time 8    Period Weeks    Status Partially Met      SLP LONG TERM GOAL #4   Title Patient will improve communication function by self-advocating for accomodations/strategies in conversations outside [place] room.    Time 8    Period Weeks    Status New      SLP LONG TERM GOAL #5   Title Patient will use strategies for processing verbal information (rephrasing, active listening, taking notes, request repetition) in moderately complex conversation for >90% comprehension of details.    Time 8    Period Weeks    Status New            Plan - 07/03/20 1250    Clinical Impression Statement Catherine Berg continues to improve use of compensations at home, is improving awareness of need to manage energy conservation  and scheduling tasks appropriately. Focused session on use of strategies to improve conversation including thought organization and recall of verbal information. I recommend skilled ST to train pt in compensations and strategies to improve her attention, recall and organization skills  in order to improve function at work and home.    Speech Therapy Frequency 2x / week    Duration 8 weeks    Treatment/Interventions Environmental controls;Cueing hierarchy;SLP instruction and feedback;Cognitive reorganization;Compensatory techniques;Functional tasks;Compensatory strategies;Internal/external aids;Patient/family education    Potential to Achieve Goals Good    Consulted and Agree with Plan of Care Patient           Patient will benefit from skilled therapeutic intervention in order to improve the following deficits and impairments:   Cognitive communication deficit  COVID-19 long hauler manifesting chronic concentration deficit    Problem List Patient Active Problem List   Diagnosis Date Noted  . 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 Sleeps in sitting position due to orthopnea 07/02/2020  . 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 Depression 06/27/2020  . Vitamin D deficiency 04/22/2020  . 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 History of COVID-19 02/28/2020  . 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 Memory loss 02/28/2020  . Physical deconditioning 02/28/2020  . Dyspnea on exertion 02/28/2020  . Neck pain, bilateral 02/28/2020  . Muscle spasms of neck 02/28/2020  . Gallstones 01/30/2020  . COVID-19 01/23/2020  . Arthralgia of hand 01/24/2019  . CRP elevated 01/24/2019  . ESR raised 01/24/2019  . Class 3 severe obesity due to excess calories without serious comorbidity in adult ([place]) 01/24/2019  . Iron deficiency anemia   . Palpitations 09/16/2018  . Coronary artery calcification 09/16/2018  . Postablative  hypothyroidism 12/29/2017  . Bilateral leg edema 04/20/2017  . Incidental lung nodule, > 49m and < 821m11/28/2018  . Hives 09/02/2015  . Morbid obesity ([place]04/13/2017  . Anxiety 07/09/2015  . 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 Thyroid nodule 02/01/2015  . Vitamin D deficiency disease   . Vitamin B12 deficiency   . History of abnormal cervical Pap smear   . Allergy-induced asthma, mild intermittent, uncomplicated 0629/56/2130 MaDeneise LeverMSFlemingsburgCCC-SLP Speech-Language Pathologist  MaAliene Altes/06/2020, 12:51 PM  CoBlue RidgeAIN RETr[facility] MolineERVICES [id number]: [phone] Fax:  [phone] Name: Catherine SPARLINRN: [id number] of Birth: 01/1981-10-04

## 2020-07-04 ENCOUNTER — Telehealth: Payer: Self-pay | Admitting: Neurology

## 2020-07-04 ENCOUNTER — Other Ambulatory Visit: Payer: Self-pay | Admitting: Nurse Practitioner

## 2020-07-04 DIAGNOSIS — E559 Vitamin D deficiency, unspecified: Secondary | ICD-10-CM

## 2020-07-04 DIAGNOSIS — Z8616 Personal history of COVID-19: Secondary | ICD-10-CM

## 2020-07-04 DIAGNOSIS — R5383 Other fatigue: Secondary | ICD-10-CM

## 2020-07-04 NOTE — Telephone Encounter (Signed)
I have re-entered the orders. Could you please let her know. Thanks.

## 2020-07-04 NOTE — Telephone Encounter (Signed)
Called the patient and reviewed the home sleep test results.  Advised that overall the study was a normal finding.  There is no evidence of obstructive sleep apnea, low oxygen or irregular heart rhythm.  This was a valid study showing a total sleep time of little over 6 hours.  From this study this would be a normal finding and no concerns.  Advised the patient to contact us if she has any issues otherwise there is no need to follow-up with the sleep clinic.  Patient verbalized understanding and had no further questions.

## 2020-07-04 NOTE — Telephone Encounter (Signed)
-----   Message from Melvyn Novas, MD sent at 07/02/2020 12:28 PM EST ----- IMPRESSION: The very unexpected finding of this HST was that there was no evidence of OSA (obstructive sleep apnea) while recording for a sufficient sleep time, with normal oxygenation and normal heart rate noted.   RECOMMENDATION:  This HST found no tachycardia, hypoxia or apnea. Could this be anxiety ?

## 2020-07-09 ENCOUNTER — Ambulatory Visit: Payer: Managed Care, Other (non HMO) | Admitting: Speech Pathology

## 2020-07-11 ENCOUNTER — Other Ambulatory Visit: Payer: Self-pay

## 2020-07-11 ENCOUNTER — Ambulatory Visit: Payer: Managed Care, Other (non HMO) | Admitting: Speech Pathology

## 2020-07-11 DIAGNOSIS — R41841 Cognitive communication deficit: Secondary | ICD-10-CM | POA: Diagnosis not present

## 2020-07-11 DIAGNOSIS — R41840 Attention and concentration deficit: Secondary | ICD-10-CM

## 2020-07-11 DIAGNOSIS — U099 Post covid-19 condition, unspecified: Secondary | ICD-10-CM

## 2020-07-11 NOTE — Therapy (Signed)
[place] MAIN [facility] SERVICES [address] [place], [place], [address] Phone: [phone]   Fax:  [phone]  Speech Language Pathology Treatment  Patient Details  Name: Catherine Berg MRN: [id number] Date of Birth: 1980-12-12 Referring Provider (SLP): Lazaro Arms, NP   Encounter Date: 07/11/2020   End of Session - 07/11/20 1254    Visit Number 14    Number of Visits 17    Date for SLP Re-Evaluation 09/29/20    Authorization Type [employer]    Authorization Time Period 06/24/20    Authorization - Visit Number 4    Progress Note Due on Visit 10    SLP Start Time 1113   pt 13 min late   SLP Stop Time  1200    SLP Time Calculation (min) 47 min    Activity Tolerance Patient tolerated treatment well           Past Medical History:  Diagnosis Date  . Abnormal thyroid blood test   . Anemia   . Anxiety   . Asthma   . Complication of anesthesia    itching after c sections  . 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 Graves disease   . History of abnormal cervical Pap smear   . History of cervical polypectomy   . Hives 09/02/2015  . Hypertension   . IFG (impaired fasting glucose)   . Incidental lung nodule, > 36m and < 822m11/28/2018   4 mm RLL lung nodule on chest CT Mar 31, 2017  . Low serum vitamin D   . Obesity   . Pregnancy induced hypertension    with 1st pregnancy, normal pressure with 2nd  . Reflux   . Thyroid disease   . Vitamin B12 deficiency   . Vitamin D deficiency disease   . Wears dentures    full upper    Past Surgical History:  Procedure Laterality Date  . CERVICAL POLYPECTOMY    . CESAREAN SECTION     x 2  . COLONOSCOPY WITH PROPOFOL N/A 11/14/2018   Procedure: COLONOSCOPY WITH PROPOFOL;  Surgeon: TaVirgel ManifoldMD;  [place];  Laterality: N/A;  . ESOPHAGOGASTRODUODENOSCOPY (EGD) WITH PROPOFOL N/A 11/14/2018   Procedure:  ESOPHAGOGASTRODUODENOSCOPY (EGD) WITH PROPOFOL;  Surgeon: TaVirgel ManifoldMD;  [place]: Endoscopy;  Laterality: N/A;  Latex allergy  . GIVENS CAPSULE STUDY N/A 12/27/2018   Procedure: GIVENS CAPSULE STUDY;  Surgeon: TaVirgel ManifoldMD;  Location: [facility] ENDOSCOPY;  Service: Endoscopy;  Laterality: N/A;    There were no vitals filed for this visit.   Subjective Assessment - 07/11/20 1245    Subjective "It's been a rough week."    Currently in Pain? No/denies                 ADULT SLP TREATMENT - 07/11/20 1246      General Information   Behavior/Cognition Alert;Cooperative    HPI Catherine Berg a [age].o. 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 Past medical history is noted for anxiety, asthma, Graves disease, HTN.      Treatment Provided   Treatment provided Cognitive-Linquistic      Pain Assessment   Pain Assessment No/denies pain      Cognitive-Linquistic Treatment   Treatment focused on Cognition;Patient/family/caregiver education    Skilled Treatment Patient has had a cold this week,  has been sleeping a lot. She missed a lab appointment yesterday. Has not been making her schedule; after dropping her kids at school going back to bed. Pt verbalized need to get back on track with using strategies. She stated strategies that help her manage her symptoms including setting reminders, making her schedule, making lists for her kids, and writing things down. She has made an effort this week to self-advocate for using compensations; has been asking her brother to give her time to make a note or set a reminder during their conversations. Targeted processing of verbal information; patient requested repetition when needed and took photo of visual aid while we discussed executive function deficits and compensations. Patient identified areas where she has improved  (self-monitoring, planning and organization) and verbalized the strategies that help her with this. She identified areas for improvement such as initiation, thought organization, and working memory, required mod cues to generate strategies and compensations. Pt took note of these in her phone.      Assessment / Recommendations / Plan   Plan Continue with current plan of care      Progression Toward Goals   Progression toward goals Progressing toward goals            SLP Education - 07/11/20 1253    Education Details what are executive functions? and compensations    Person(s) Educated Patient    Methods Explanation    Comprehension Verbalized understanding;Returned demonstration            SLP Short Term Goals - 07/01/20 1236      SLP SHORT TERM GOAL #1   Title Patient will obtain a system for recall/executive function and bring to at least 3 therapy sessions.    Time 4    Period Weeks    Status Achieved    Target Date 06/12/20      SLP SHORT TERM GOAL #2   Title Patient will report no forgotten items at the grocery store using compensations for recall.    Time 4    Period Weeks    Status Achieved            SLP Long Term Goals - 07/01/20 1153      SLP LONG TERM GOAL #1   Title Patient will use external aids/strategies to run 2+ errands in a row.    Time 8    Period Weeks    Status Partially Met      SLP LONG TERM GOAL #2   Title Patient will report no missed medications or bills 4 visits in a row using memory compensations.    Time 8    Period Weeks    Status On-going      SLP LONG TERM GOAL #3   Title Patient will use compensations for attention/information processing to complete mod complex cognitive linguistic tasks >85% accuracy.    Time 8    Period Weeks    Status Partially Met      SLP LONG TERM GOAL #4   Title Patient will improve communication function by self-advocating for accomodations/strategies in conversations outside [place] room.    Time 8     Period Weeks    Status New      SLP LONG TERM GOAL #5   Title Patient will use strategies for processing verbal information (rephrasing, active listening, taking notes, request repetition) in moderately complex conversation for >90% comprehension of details.    Time 8    Period Weeks    Status  New            Plan - 07/11/20 1255    Clinical Impression Statement Catherine Berg had a setback with illness this week, but overall continues to improve use of compensations at home, and self-advocating for use of strategies. Verbalized strategies that have been effective for her and identified/set reminders for to get back on routine now that she is starting to feel better. I recommend skilled ST to maximize carryover of strategies to improve her attention, recall and organization skills in order to improve function for return to work and home.    Speech Therapy Frequency 2x / week    Duration 8 weeks    Treatment/Interventions Environmental controls;Cueing hierarchy;SLP instruction and feedback;Cognitive reorganization;Compensatory techniques;Functional tasks;Compensatory strategies;Internal/external aids;Patient/family education    Potential to Achieve Goals Good    Consulted and Agree with Plan of Care Patient           Patient will benefit from skilled therapeutic intervention in order to improve the following deficits and impairments:   Cognitive communication deficit  COVID-19 long hauler manifesting chronic concentration deficit    Problem List Patient Active Problem List   Diagnosis Date Noted  . 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 Sleeps in sitting position due to orthopnea 07/02/2020  . 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 Depression 06/27/2020  . Vitamin D deficiency 04/22/2020  . 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 History of COVID-19 02/28/2020  . 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 Memory loss  02/28/2020  . Physical deconditioning 02/28/2020  . Dyspnea on exertion 02/28/2020  . Neck pain, bilateral 02/28/2020  . Muscle spasms of neck 02/28/2020  . Gallstones 01/30/2020  . COVID-19 01/23/2020  . Arthralgia of hand 01/24/2019  . CRP elevated 01/24/2019  . ESR raised 01/24/2019  . Class 3 severe obesity due to excess calories without serious comorbidity in adult ([place]) 01/24/2019  . Iron deficiency anemia   . Palpitations 09/16/2018  . Coronary artery calcification 09/16/2018  . 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 Bilateral leg edema 04/20/2017  . Incidental lung nodule, > 38m and < 814m11/28/2018  . Hives 09/02/2015  . Morbid obesity ([place] Hope04/13/2017  . Anxiety 07/09/2015  . 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 Thyroid nodule 02/01/2015  . Vitamin D deficiency disease   . Vitamin B12 deficiency   . History of abnormal cervical Pap smear   . Allergy-induced asthma, mild intermittent, uncomplicated [id number] Catherine LeverMSBaileyvilleCCC-SLP Speech-Language Pathologist  MaAliene Altes/02/2021, 12:57 PM  CoRawsonAIN REMonroe HospitalERVICES [address][id number]: [phone] Fax:  [phone] Name: TaARCELIA PALSRN: [id number] of Birth: 9/May 18, 1980

## 2020-07-15 ENCOUNTER — Ambulatory Visit: Payer: Managed Care, Other (non HMO) | Admitting: Speech Pathology

## 2020-07-15 NOTE — Progress Notes (Signed)
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  She continues to have fatigue, worried about memory loss ( seeing neurologist - Dr. Brett Fairy )She was seen by COVID-19 clinic end of October and was referred to PT, [place] and also neurologist. Dr. Brett Fairy referred her for a home sleep study but that was normal  She has a history of asthma and used to see Dr. Felicie Morn.  She noticed fatigue, dizziness and tachycardia  on 04/30/2020, her COVID-19 test was negative, but daughter positive, she is states still has dizzy episodes a couple of times a month. Not orthostatic changes, can happen while sitting  She has not been back to work yet, she is a Librarian, academic for [employer], FMLA filled out to go back to work on March 1st, however she is feeling more anxious about returning to work, crying when she thinks about it, states not stamina, gets up early but a few hours later she feels drained. She has follow up with psychiatrist and we will post-pone return until evaluated by them  MDD/ Anxiety : she is now taking Duloxetine 60 mg, she is taking hydroxyzine prn to help with anxiety and trazodone 50 mg every night to help her fall and stay asleep. She takes Adderal for energy and focus. She has crying spells, does not like going inside of her job due to feeling like she is not welcomed. She has follow up with psychiatrist next week. She is not ready to go back to work, she would like to see psychiatrist first and decide after the visit, discussed going back 4 hours per day , starting at 3 days a week and work one extra day per week as tolerated. She will contact me back after appointment with  psychiatrist . She has enough of Adderal until visit with psychiatrist, and will discuss it with her from there on  Normal Sleep Study: normal sleep study , normal oxygen and normal heart rhythm, per sleep lab to consider anxiety   Morbid Obesity: she has not been very active because of long haul covid and severe fatigue, discussed low carb diet  Low back pain with right leg sciatica: going on for a couple of months but went to Urgent Care 06/29/2020 because of worsening of symptoms , she states radiates down right posterior leg, , we will increase dose of lyrica and add celebrex with foot today   Patient Active Problem List   Diagnosis Date Noted  . 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 Sleeps in sitting position due to orthopnea 07/02/2020  . 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 Vitamin D deficiency 04/22/2020  . 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 Memory loss 02/28/2020  . Physical deconditioning 02/28/2020  . Dyspnea on exertion 02/28/2020  . Neck pain, bilateral 02/28/2020  . Muscle spasms of neck 02/28/2020  . Gallstones 01/30/2020  . Arthralgia of hand 01/24/2019  . CRP elevated 01/24/2019  . ESR raised 01/24/2019  . Class 3 severe obesity due to excess calories without serious comorbidity in adult ([place]) 01/24/2019  .  Palpitations 09/16/2018  . Coronary artery calcification 09/16/2018  . 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 Bilateral leg edema 04/20/2017  . Incidental lung nodule, > 11m and < 878m11/28/2018  . Hives 09/02/2015  . Morbid obesity ([place]/13/2017  . Anxiety 07/09/2015  . 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 Thyroid nodule 02/01/2015  . Vitamin B12 deficiency   . History of abnormal cervical Pap smear   . Allergy-induced asthma, mild intermittent, uncomplicated 0670/[id number]  Past Surgical History:  Procedure Laterality Date  .  CERVICAL POLYPECTOMY    . CESAREAN SECTION     x 2  . COLONOSCOPY WITH PROPOFOL N/A 11/14/2018   Procedure: COLONOSCOPY WITH PROPOFOL;  Surgeon: TaVirgel ManifoldMD;  [facility];  Laterality: N/A;  . ESOPHAGOGASTRODUODENOSCOPY (EGD) WITH PROPOFOL N/A 11/14/2018   Procedure: ESOPHAGOGASTRODUODENOSCOPY (EGD) WITH PROPOFOL;  Surgeon: TaVirgel ManifoldMD;  [facility];  Laterality: N/A;  Latex allergy  . GIVENS CAPSULE STUDY N/A 12/27/2018   Procedure: GIVENS CAPSULE STUDY;  Surgeon: TaVirgel ManifoldMD;  Location: [facility] ENDOSCOPY;  Service: Endoscopy;  Laterality: N/A;    Family History  Problem Relation Age of Onset  . Heart attack Mother 4751. Hypertension Mother   . Asthma Mother   . Cancer Mother        laryngeal  . Diabetes Mother        pre diabetic  . Heart disease Mother   . Mental illness Mother   . Alcohol abuse Mother   . Drug abuse Mother   . Depression Mother   . Anxiety disorder Mother   . Bipolar disorder Mother   . Learning disabilities Brother   . ADD / ADHD Brother   . Asthma Son   . Hyperlipidemia Brother   . Alcohol abuse Father   . Heart disease Maternal Grandmother        heart attack, pacemaker  . Heart attack Maternal Grandmother   . Hypertension Maternal Grandmother   . Hypertension Maternal Grandfather   . Heart disease Paternal Grandmother        couple of major open heart surgeries, leaking valves  . Hypertension Paternal Grandmother   . Hypertension Paternal Grandfather   . Breast cancer Neg Hx     Social History   Tobacco Use  . Smoking status: Never Smoker  . Smokeless tobacco: Never Used  Substance Use Topics  . Alcohol use: No    Alcohol/week: 0.0 standard drinks     Current Outpatient Medications:  .  albuterol (PROVENTIL HFA;VENTOLIN HFA) 108 (90 Base) MCG/ACT inhaler, Inhale 2 puffs into the lungs every 6 (six) hours as needed for wheezing  or shortness of breath., Disp: 18 g, Rfl: 1 .  amphetamine-dextroamphetamine (ADDERALL XR) 15 MG 24 hr capsule, Take 1 capsule by mouth daily., Disp: 30 capsule, Rfl: 0 .  celecoxib (CELEBREX) 200 MG capsule, Take 1 capsule (200 mg total) by mouth daily after lunch., Disp: 30 capsule, Rfl: 0 .  clobetasol (TEMOVATE) 0.05 % external solution, Apply 1 application topically 2 (two) times daily., Disp: 50 mL, Rfl: 0 .  Cyanocobalamin (B-12) 1000 MCG SUBL, Place 1 tablet under the tongue daily., Disp: 30 each, Rfl: 5 .  Elastic Bandages & Supports ([place], Wear from morning to night, but do not sleep in these, Disp: 1 each, Rfl: 2 .  Etonogestrel (NEXPLANON [place]), Inject into the skin., Disp: , Rfl:  .  famotidine (PEPCID) 20 MG tablet, Take 1 tablet (20 mg total) by mouth daily., Disp: 30 tablet, Rfl: 1 .  fluticasone (FLONASE) 50 MCG/ACT nasal spray, Place 2 sprays into both nostrils daily., Disp: 16 g, Rfl: 2 .  hydrOXYzine (ATARAX/VISTARIL) 10 MG tablet, Take 1 tablet (10 mg total) by mouth 3 (three) times daily as needed., Disp: 30 tablet, Rfl: 0 .  levothyroxine (SYNTHROID) 50 MCG tablet, Take 75 mcg by mouth daily., Disp: , Rfl:  .  montelukast (SINGULAIR) 10 MG tablet, Take 1 tablet (10 mg total) by mouth at bedtime., Disp: 90 tablet, Rfl: 1 .  tiZANidine (ZANAFLEX) 4 MG tablet, Take 0.5-1.5 tablets (2-6 mg total) by mouth every 8 (eight) hours as needed for muscle spasms (muscle tightness)., Disp: 90 tablet, Rfl: 2 .  DULoxetine (CYMBALTA) 60 MG capsule, Take 1 capsule (60 mg total) by mouth in the morning. ily, Disp: 90 capsule, Rfl: 0 .  pregabalin (LYRICA) 100 MG capsule, Take 1 capsule (100 mg total) by mouth at bedtime as needed., Disp: 90 capsule, Rfl: 0 .  traZODone (DESYREL) 50 MG tablet, Take 0.5-1 tablets (25-50 mg total) by mouth at bedtime as needed for sleep., Disp: 90 tablet, Rfl: 0 .  Vitamin D, Ergocalciferol, (DRISDOL) 1.25 MG (50000 UNIT) CAPS capsule,  Take 1 capsule (50,000 Units total) by mouth every 7 (seven) days., Disp: 12 capsule, Rfl: 1  Allergies  Allergen Reactions  . Latex Hives    (Balloons, condoms, underwear elastic, gloves)  . Shellfish Allergy Hives    I personally reviewed active problem list, medication list, allergies, family history, social history, health maintenance with the patient/caregiver today.   ROS  Ten systems reviewed and is negative except as mentioned in HPI   Objective  Vitals:   07/17/20 0852  BP: 126/88  Pulse: 90  Resp: 16  Temp: 98.4 F (36.9 C)  TempSrc: Oral  SpO2: 99%  Weight: 237 lb (107.5 kg)  Height: '5\' 3"'  (1.6 m)    Body mass index is 41.98 kg/m.  Physical Exam  Constitutional: Patient appears well-developed and well-nourished. Obese  No distress.  HEENT: head atraumatic, normocephalic, pupils equal and reactive to light, neck supple Cardiovascular: Normal rate, regular rhythm and normal heart sounds.  No murmur heard. No BLE edema. Pulmonary/Chest: Effort normal and breath sounds normal. No respiratory distress. Abdominal: Soft.  There is no tenderness. Psychiatric: Patient has a normal mood and affect. behavior is normal. Judgment and thought content normal.  PHQ2/9: Depression screen [facility] 2/9 07/17/2020 07/03/2020 06/10/2020 05/06/2020 03/06/2020  Decreased Interest '2 2 2 1 2  ' Down, Depressed, Hopeless '1 2 1 1 2  ' PHQ - 2 Score '3 4 3 2 4  ' Altered sleeping '1 2 1 1 2  ' Tired, decreased energy '1 1 2 3 3  ' Change in appetite 0 '2 3 2 1  ' Feeling bad or failure about yourself  '1 1 1 1 1  ' Trouble concentrating 0 0 0 0 0  Moving slowly or fidgety/restless 0 0 0 0 0  Suicidal thoughts 0 0 0 0 0  PHQ-9 Score '6 10 10 9 11  ' Difficult doing work/chores - - Very difficult - Somewhat difficult  Some recent data might be hidden    phq 9 is positive   Fall Risk: Fall Risk  07/17/2020 06/10/2020 05/06/2020 03/06/2020 02/28/2020  Falls in the past year? '1 1 1 ' 0 0  Number falls in past yr: 0  0 0 0 0  Injury with Fall?  1 0 0 0 0  Comment - - - - -  Follow up - - - - -    Functional Status Survey: Is the patient deaf or have difficulty hearing?: No Does the patient have difficulty seeing, even when wearing glasses/contacts?: No Does the patient have difficulty concentrating, remembering, or making decisions?: Yes Does the patient have difficulty walking or climbing stairs?: No Does the patient have difficulty dressing or bathing?: No Does the patient have difficulty doing errands alone such as visiting a doctor's office or shopping?: No    Assessment & Plan  1. COVID-19 long hauler  Seeing multiple sub-specialist   2. Neuropathy involving both lower extremities  - pregabalin (LYRICA) 100 MG capsule; Take 1 capsule (100 mg total) by mouth at bedtime as needed.  Dispense: 90 capsule; Refill: 0  3. Vitamin D deficiency disease  - VITAMIN D 25 Hydroxy (Vit-D Deficiency, Fractures) - Vitamin D, Ergocalciferol, (DRISDOL) 1.25 MG (50000 UNIT) CAPS capsule; Take 1 capsule (50,000 Units total) by mouth every 7 (seven) days.  Dispense: 12 capsule; Refill: 1  4. Vitamin B12 deficiency  - Vitamin B12  5. History of iron deficiency  - CBC with Differential/Platelet  6. Elevated C-reactive protein (CRP)  - C-reactive protein - Sedimentation rate  7. MDD (major depressive disorder), recurrent episode, mild ([name])   8. Morbid obesity ([place])  Discussed with the patient the risk posed by an increased BMI. Discussed importance of portion control, calorie counting and at least 150 minutes of physical activity weekly. Avoid sweet beverages and drink more water. Eat at least 6 servings of fruit and vegetables daily   9. Lipid screening  - Lipid panel  10. Other fatigue  - COMPLETE METABOLIC PANEL WITH GFR - CBC with Differential/Platelet  11. Diabetes mellitus screening  - Hemoglobin A1c  12. Moderate recurrent major depression (HCC)  - DULoxetine (CYMBALTA) 60 MG  capsule; Take 1 capsule (60 mg total) by mouth in the morning. ily  Dispense: 90 capsule; Refill: 0  13. Insomnia, psychophysiological  - traZODone (DESYREL) 50 MG tablet; Take 0.5-1 tablets (25-50 mg total) by mouth at bedtime as needed for sleep.  Dispense: 90 tablet; Refill: 0  14. Back pain with right-sided radiculopathy  - pregabalin (LYRICA) 100 MG capsule; Take 1 capsule (100 mg total) by mouth at bedtime as needed.  Dispense: 90 capsule; Refill: 0 - celecoxib (CELEBREX) 200 MG capsule; Take 1 capsule (200 mg total) by mouth daily after lunch.  Dispense: 30 capsule; Refill: 0
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
## 2020-07-17 ENCOUNTER — Ambulatory Visit: Payer: Managed Care, Other (non HMO) | Admitting: Speech Pathology

## 2020-07-17 ENCOUNTER — Encounter: Payer: Self-pay | Admitting: Family Medicine

## 2020-07-17 ENCOUNTER — Ambulatory Visit (INDEPENDENT_AMBULATORY_CARE_PROVIDER_SITE_OTHER): Payer: Managed Care, Other (non HMO) | Admitting: Family Medicine

## 2020-07-17 ENCOUNTER — Other Ambulatory Visit: Payer: Self-pay

## 2020-07-17 VITALS — BP 126/88 | HR 90 | Temp 98.4°F | Resp 16 | Ht 63.0 in | Wt 237.0 lb

## 2020-07-17 DIAGNOSIS — U099 Post covid-19 condition, unspecified: Secondary | ICD-10-CM | POA: Diagnosis not present

## 2020-07-17 DIAGNOSIS — E559 Vitamin D deficiency, unspecified: Secondary | ICD-10-CM | POA: Diagnosis not present

## 2020-07-17 DIAGNOSIS — M5410 Radiculopathy, site unspecified: Secondary | ICD-10-CM

## 2020-07-17 DIAGNOSIS — R7982 Elevated C-reactive protein (CRP): Secondary | ICD-10-CM

## 2020-07-17 DIAGNOSIS — R41841 Cognitive communication deficit: Secondary | ICD-10-CM

## 2020-07-17 DIAGNOSIS — G5793 Unspecified mononeuropathy of bilateral lower limbs: Secondary | ICD-10-CM

## 2020-07-17 DIAGNOSIS — R41840 Attention and concentration deficit: Secondary | ICD-10-CM

## 2020-07-17 DIAGNOSIS — F331 Major depressive disorder, recurrent, moderate: Secondary | ICD-10-CM

## 2020-07-17 DIAGNOSIS — Z8639 Personal history of other endocrine, nutritional and metabolic disease: Secondary | ICD-10-CM

## 2020-07-17 DIAGNOSIS — E538 Deficiency of other specified B group vitamins: Secondary | ICD-10-CM | POA: Diagnosis not present

## 2020-07-17 DIAGNOSIS — Z131 Encounter for screening for diabetes mellitus: Secondary | ICD-10-CM

## 2020-07-17 DIAGNOSIS — F5104 Psychophysiologic insomnia: Secondary | ICD-10-CM

## 2020-07-17 DIAGNOSIS — F330 Major depressive disorder, recurrent, mild: Secondary | ICD-10-CM

## 2020-07-17 DIAGNOSIS — Z13220 Encounter for screening for lipoid disorders: Secondary | ICD-10-CM

## 2020-07-17 DIAGNOSIS — R5383 Other fatigue: Secondary | ICD-10-CM

## 2020-07-17 MED ORDER — VITAMIN D (ERGOCALCIFEROL) 1.25 MG (50000 UNIT) PO CAPS: 50000.0000 [IU] | ORAL_CAPSULE | ORAL | 1 refills | Status: DC

## 2020-07-17 MED ORDER — PREGABALIN 100 MG PO CAPS: 100.0000 mg | ORAL_CAPSULE | Freq: Every evening | ORAL | 0 refills | Status: DC | PRN

## 2020-07-17 MED ORDER — DULOXETINE HCL 60 MG PO CPEP: 60.0000 mg | ORAL_CAPSULE | Freq: Every morning | ORAL | 0 refills | Status: DC

## 2020-07-17 MED ORDER — TRAZODONE HCL 50 MG PO TABS: 25.0000 mg | ORAL_TABLET | Freq: Every evening | ORAL | 0 refills | Status: DC | PRN

## 2020-07-17 MED ORDER — CELECOXIB 200 MG PO CAPS: 200.0000 mg | ORAL_CAPSULE | Freq: Every day | ORAL | 0 refills | Status: DC

## 2020-07-17 NOTE — Therapy (Signed)
[facility] SERVICES [address] [place], [place], [address] Phone: [phone]   Fax:  [phone]  Speech Language Pathology Treatment  Patient Details  Name: Catherine Berg MRN: [id number] Date of Birth: 1980/06/09 Referring Provider (SLP): Lazaro Arms, NP   Encounter Date: 07/17/2020   End of Session - 07/17/20 1548    Visit Number 15    Number of Visits 17    Date for SLP Re-Evaluation 09/29/20    Authorization Type [employer]    Authorization Time Period 06/24/20    Authorization - Visit Number 5    Progress Note Due on Visit 10    SLP Start Time 1504    SLP Stop Time  2876   pt needed to leave early to pick up kids   SLP Time Calculation (min) 38 min    Activity Tolerance Patient tolerated treatment well           Past Medical History:  Diagnosis Date  . Abnormal thyroid blood test   . Anemia   . Anxiety   . Asthma   . Complication of anesthesia    itching after c sections  . 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 Graves disease   . History of abnormal cervical Pap smear   . History of cervical polypectomy   . Hives 09/02/2015  . Hypertension   . IFG (impaired fasting glucose)   . Incidental lung nodule, > 75m and < 839m11/28/2018   4 mm RLL lung nodule on chest CT Mar 31, 2017  . Low serum vitamin D   . Obesity   . Pregnancy induced hypertension    with 1st pregnancy, normal pressure with 2nd  . Reflux   . Thyroid disease   . Vitamin B12 deficiency   . Vitamin D deficiency disease   . Wears dentures    full upper    Past Surgical History:  Procedure Laterality Date  . CERVICAL POLYPECTOMY    . CESAREAN SECTION     x 2  . COLONOSCOPY WITH PROPOFOL N/A 11/14/2018   Procedure: COLONOSCOPY WITH PROPOFOL;  Surgeon: TaVirgel ManifoldMD;  [facility];  Laterality: N/A;  . ESOPHAGOGASTRODUODENOSCOPY (EGD) WITH PROPOFOL N/A 11/14/2018    Procedure: ESOPHAGOGASTRODUODENOSCOPY (EGD) WITH PROPOFOL;  Surgeon: TaVirgel ManifoldMD;  [facility]: [facility];  Laterality: N/A;  Latex allergy  . GIVENS CAPSULE STUDY N/A 12/27/2018   Procedure: GIVENS CAPSULE STUDY;  Surgeon: TaVirgel ManifoldMD;  Location: [facility] END[facility];  Service: Endoscopy;  Laterality: N/A;    There were no vitals filed for this visit.   Subjective Assessment - 07/17/20 1541    Subjective Reports she is back on routine    Currently in Pain? No/denies                 ADULT SLP TREATMENT - 07/17/20 1541      General Information   Behavior/Cognition Alert;Cooperative;Pleasant mood    HPI Catherine L. HaAldean Bakers a [age].o. 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 Past medical history is noted for anxiety, asthma, Graves disease, HTN.      Treatment Provided   Treatment provided Cognitive-Linquistic      Pain Assessment   Pain Assessment No/denies pain      Cognitive-Linquistic Treatment   Treatment focused on Cognition;Patient/family/caregiver education    Skilled Treatment  Patient reports back on routine this week; she nearly missed appointments for her kids which were made 6 months ago, pre-Covid. This time when she scheduled next appointments she made sure they are in her calendar. Had PCP visit this morning; pt showed SLP list of topics she prepared in advance to discuss with MD, as well as notes she took during appointment. She has eval with psychiatry next week; with min cue pt made a list of her goals/ topics to address at this appointment. During session pt was interupted by calls/ texts from her kids notifying her they needed pick-up. Min cues necessary to self-advocate need to use strategy or single task (request a minute to reply vs attempting to multi-task). As SLP told pt her homework, pt used compensations for processing, taking notes  on her phone, requesting repetition, and repeating back a summary to confirm, without cues.      Assessment / Recommendations / Plan   Plan Continue with current plan of care      Progression Toward Goals   Progression toward goals Progressing toward goals            SLP Education - 07/17/20 1547    Education Details prepare script/preplan for difficult conversations    Person(s) Educated Patient    Methods Explanation    Comprehension Verbalized understanding            SLP Short Term Goals - 07/01/20 1236      SLP SHORT TERM GOAL #1   Title Patient will obtain a system for recall/executive function and bring to at least 3 therapy sessions.    Time 4    Period Weeks    Status Achieved    Target Date 06/12/20      SLP SHORT TERM GOAL #2   Title Patient will report no forgotten items at the grocery store using compensations for recall.    Time 4    Period Weeks    Status Achieved            SLP Long Term Goals - 07/01/20 1153      SLP LONG TERM GOAL #1   Title Patient will use external aids/strategies to run 2+ errands in a row.    Time 8    Period Weeks    Status Partially Met      SLP LONG TERM GOAL #2   Title Patient will report no missed medications or bills 4 visits in a row using memory compensations.    Time 8    Period Weeks    Status On-going      SLP LONG TERM GOAL #3   Title Patient will use compensations for attention/information processing to complete mod complex cognitive linguistic tasks >85% accuracy.    Time 8    Period Weeks    Status Partially Met      SLP LONG TERM GOAL #4   Title Patient will improve communication function by self-advocating for accomodations/strategies in conversations outside [facility] room.    Time 8    Period Weeks    Status New      SLP LONG TERM GOAL #5   Title Patient will use strategies for processing verbal information (rephrasing, active listening, taking notes, request repetition) in moderately complex  conversation for >90% comprehension of details.    Time 8    Period Weeks    Status New            Plan - 07/17/20 1549  Clinical Impression Statement Patient demonstrating carryover of compensations for processing and recall by preparing for her MD appointment this week. Pt able to verbalize barriers and strategies to help her function better. Cues necessary today to request single tasking when interrupting occurred. Overall pt is making excellent progress; anticipate d/c in next 1-2 sessions. I recommend skilled ST to maximize carryover of strategies to improve her attention, recall and organization skills in order to improve function for return to work and home.    Speech Therapy Frequency 2x / week    Duration 8 weeks    Treatment/Interventions Environmental controls;Cueing hierarchy;SLP instruction and feedback;Cognitive reorganization;Compensatory techniques;Functional tasks;Compensatory strategies;Internal/external aids;Patient/family education    Potential to Achieve Goals Good    Consulted and Agree with Plan of Care Patient           Patient will benefit from skilled therapeutic intervention in order to improve the following deficits and impairments:   Cognitive communication deficit  COVID-19 long hauler manifesting chronic concentration deficit    Problem List Patient Active Problem List   Diagnosis Date Noted  . 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 Sleeps in sitting position due to orthopnea 07/02/2020  . 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 Vitamin D deficiency 04/22/2020  . 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 Memory loss 02/28/2020  . Physical deconditioning 02/28/2020  . Dyspnea on exertion 02/28/2020  . Neck pain, bilateral 02/28/2020  . Muscle spasms of neck 02/28/2020  . Gallstones 01/30/2020  . Arthralgia of hand 01/24/2019  . CRP  elevated 01/24/2019  . ESR raised 01/24/2019  . Class 3 severe obesity due to excess calories without serious comorbidity in adult ([place]) 01/24/2019  . Palpitations 09/16/2018  . Coronary artery calcification 09/16/2018  . 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 Bilateral leg edema 04/20/2017  . Incidental lung nodule, > 85m and < 841m11/28/2018  . Hives 09/02/2015  . Morbid obesity ([place]CC[place]04/13/2017  . Anxiety 07/09/2015  . 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 Thyroid nodule 02/01/2015  . Vitamin B12 deficiency   . History of abnormal cervical Pap smear   . Allergy-induced asthma, mild intermittent, uncomplicated 0616/02/9603 MaDeneise LeverMSChidesterCCC-SLP Speech-Language Pathologist  MaAliene Altes/16/2022, 3:51 PM  [facility] RE[facility] HospitalERVICES [address]Colonial HeightsNC[place]hone: [phone] Fax:  [phone] Name: TaADAIRA CENTOLARN: [id number] of Birth: 01/1981-02-22

## 2020-07-18 LAB — VITAMIN B12: Vitamin B-12: 291 pg/mL (ref 200–1100)

## 2020-07-18 LAB — CBC WITH DIFFERENTIAL/PLATELET
Absolute Monocytes: 383 cells/uL (ref 200–950)
Basophils Absolute: 17 cells/uL (ref 0–200)
Basophils Relative: 0.3 %
Eosinophils Absolute: 99 cells/uL (ref 15–500)
Eosinophils Relative: 1.7 %
HCT: 38.5 % (ref 35.0–45.0)
Hemoglobin: 12.5 g/dL (ref 11.7–15.5)
Lymphs Abs: 2477 cells/uL (ref 850–3900)
MCH: 28.2 pg (ref 27.0–33.0)
MCHC: 32.5 g/dL (ref 32.0–36.0)
MCV: 86.7 fL (ref 80.0–100.0)
MPV: 10 fL (ref 7.5–12.5)
Monocytes Relative: 6.6 %
Neutro Abs: 2825 cells/uL (ref 1500–7800)
Neutrophils Relative %: 48.7 %
Platelets: 331 10*3/uL (ref 140–400)
RBC: 4.44 10*6/uL (ref 3.80–5.10)
RDW: 13 % (ref 11.0–15.0)
Total Lymphocyte: 42.7 %
WBC: 5.8 10*3/uL (ref 3.8–10.8)

## 2020-07-18 LAB — CBC
Hematocrit: 38.3 % (ref 34.0–46.6)
Hemoglobin: 12.6 g/dL (ref 11.1–15.9)
MCH: 28 pg (ref 26.6–33.0)
MCHC: 32.9 g/dL (ref 31.5–35.7)
MCV: 85 fL (ref 79–97)
Platelets: 343 10*3/uL (ref 150–450)
RBC: 4.5 x10E6/uL (ref 3.77–5.28)
RDW: 12.9 % (ref 11.7–15.4)
WBC: 5.9 10*3/uL (ref 3.4–10.8)

## 2020-07-18 LAB — COMPLETE METABOLIC PANEL WITH GFR
AG Ratio: 1.2 (calc) (ref 1.0–2.5)
ALT: 19 U/L (ref 6–29)
AST: 19 U/L (ref 10–30)
Albumin: 4 g/dL (ref 3.6–5.1)
Alkaline phosphatase (APISO): 67 U/L (ref 31–125)
BUN: 16 mg/dL (ref 7–25)
CO2: 26 mmol/L (ref 20–32)
Calcium: 9.6 mg/dL (ref 8.6–10.2)
Chloride: 104 mmol/L (ref 98–110)
Creat: 0.75 mg/dL (ref 0.50–1.10)
GFR, Est African American: 116 mL/min/{1.73_m2} (ref >=60)
GFR, Est Non African American: 100 mL/min/{1.73_m2} (ref >=60)
Globulin: 3.3 g/dL (calc) (ref 1.9–3.7)
Glucose, Bld: 87 mg/dL (ref 65–99)
Potassium: 4.9 mmol/L (ref 3.5–5.3)
Sodium: 137 mmol/L (ref 135–146)
Total Bilirubin: 0.4 mg/dL (ref 0.2–1.2)
Total Protein: 7.3 g/dL (ref 6.1–8.1)

## 2020-07-18 LAB — COMPREHENSIVE METABOLIC PANEL
ALT: 20 IU/L (ref 0–32)
AST: 18 IU/L (ref 0–40)
Albumin/Globulin Ratio: 1.2 (ref 1.2–2.2)
Albumin: 4.3 g/dL (ref 3.8–4.8)
Alkaline Phosphatase: 77 IU/L (ref 44–121)
BUN/Creatinine Ratio: 16 (ref 9–23)
BUN: 14 mg/dL (ref 6–24)
Bilirubin Total: 0.4 mg/dL (ref 0.0–1.2)
CO2: 20 mmol/L (ref 20–29)
Calcium: 9.6 mg/dL (ref 8.7–10.2)
Chloride: 101 mmol/L (ref 96–106)
Creatinine, Ser: 0.85 mg/dL (ref 0.57–1.00)
Globulin, Total: 3.6 g/dL (ref 1.5–4.5)
Glucose: 92 mg/dL (ref 65–99)
Potassium: 5.2 mmol/L (ref 3.5–5.2)
Sodium: 137 mmol/L (ref 134–144)
Total Protein: 7.9 g/dL (ref 6.0–8.5)
eGFR: 89 mL/min/{1.73_m2} (ref >59)

## 2020-07-18 LAB — LIPID PANEL
Cholesterol: 113 mg/dL (ref <200)
HDL: 57 mg/dL (ref >=50)
LDL Cholesterol (Calc): 43 mg/dL (calc)
Non-HDL Cholesterol (Calc): 56 mg/dL (calc) (ref <130)
Total CHOL/HDL Ratio: 2 (calc) (ref <5.0)
Triglycerides: 53 mg/dL (ref <150)

## 2020-07-18 LAB — C-REACTIVE PROTEIN: CRP: 18.1 mg/L — ABNORMAL HIGH (ref <8.0)

## 2020-07-18 LAB — HEMOGLOBIN A1C
Hgb A1c MFr Bld: 5.2 % of total Hgb (ref <5.7)
Mean Plasma Glucose: 103 mg/dL
eAG (mmol/L): 5.7 mmol/L

## 2020-07-18 LAB — SEDIMENTATION RATE: Sed Rate: 31 mm/h — ABNORMAL HIGH (ref 0–20)

## 2020-07-18 LAB — VITAMIN D 25 HYDROXY (VIT D DEFICIENCY, FRACTURES)
Vit D, 25-Hydroxy: 14.1 ng/mL — ABNORMAL LOW (ref 30.0–100.0)
Vit D, 25-Hydroxy: 18 ng/mL — ABNORMAL LOW (ref 30–100)

## 2020-07-18 LAB — TSH: TSH: 4.13 mIU/L

## 2020-07-22 ENCOUNTER — Ambulatory Visit: Payer: Managed Care, Other (non HMO) | Admitting: Speech Pathology

## 2020-07-22 ENCOUNTER — Telehealth (INDEPENDENT_AMBULATORY_CARE_PROVIDER_SITE_OTHER): Payer: Managed Care, Other (non HMO) | Admitting: Psychiatry

## 2020-07-22 ENCOUNTER — Encounter: Payer: Self-pay | Admitting: Psychiatry

## 2020-07-22 ENCOUNTER — Other Ambulatory Visit: Payer: Self-pay

## 2020-07-22 DIAGNOSIS — F331 Major depressive disorder, recurrent, moderate: Secondary | ICD-10-CM | POA: Diagnosis not present

## 2020-07-22 DIAGNOSIS — F4310 Post-traumatic stress disorder, unspecified: Secondary | ICD-10-CM

## 2020-07-22 DIAGNOSIS — R41841 Cognitive communication deficit: Secondary | ICD-10-CM

## 2020-07-22 DIAGNOSIS — U099 Post covid-19 condition, unspecified: Secondary | ICD-10-CM

## 2020-07-22 DIAGNOSIS — R41840 Attention and concentration deficit: Secondary | ICD-10-CM

## 2020-07-22 DIAGNOSIS — F411 Generalized anxiety disorder: Secondary | ICD-10-CM | POA: Diagnosis not present

## 2020-07-22 MED ORDER — DULOXETINE HCL 30 MG PO CPEP: ORAL_CAPSULE | ORAL | 1 refills | Status: DC

## 2020-07-22 NOTE — Patient Instructions (Signed)
1. Increase duloxetine 90 mg daily 2. Referral to therapy  3. Next appointment: 4/26 at 3 PM

## 2020-07-22 NOTE — Patient Instructions (Signed)
Tips for wordfinding  . Say one thing at a time . Don't  rush - slow down, be patient . Reduce background noise/distractions  Describing words  What group does it belong to?  What do I use it for?  Where can I find it?  What does it LOOK like?  What other words go with it?  What is the 1st sound of the word?   Many Ways to Communicate  Describe it Write it Draw it Gesture it Use related words

## 2020-07-22 NOTE — Therapy (Signed)
[place] MAIN [facility] SERVICES [address] [place], [place], [address] Phone: [phone]   Fax:  [phone]  Speech Language Pathology Treatment  Patient Details  Name: Catherine Berg MRN: [id number] Date of Birth: 23-Feb-1981 Referring Provider (SLP): Lazaro Arms, NP   Encounter Date: 07/22/2020   End of Session - 07/22/20 1350    Visit Number 16    Number of Visits 17    Date for SLP Re-Evaluation 09/29/20    Authorization Type [employer]    Authorization Time Period 06/24/20    Authorization - Visit Number 6    Progress Note Due on Visit 10    SLP Start Time 1113   pt 13 min late   SLP Stop Time  1205    SLP Time Calculation (min) 52 min    Activity Tolerance Patient tolerated treatment well           Past Medical History:  Diagnosis Date  . Abnormal thyroid blood test   . Anemia   . Anxiety   . Asthma   . Complication of anesthesia    itching after c sections  . 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 Graves disease   . History of abnormal cervical Pap smear   . History of cervical polypectomy   . Hives 09/02/2015  . Hypertension   . IFG (impaired fasting glucose)   . Incidental lung nodule, > 24m and < 829m11/28/2018   4 mm RLL lung nodule on chest CT Mar 31, 2017  . Low serum vitamin D   . Obesity   . Pregnancy induced hypertension    with 1st pregnancy, normal pressure with 2nd  . Reflux   . Thyroid disease   . Vitamin B12 deficiency   . Vitamin D deficiency disease   . Wears dentures    full upper    Past Surgical History:  Procedure Laterality Date  . CERVICAL POLYPECTOMY    . CESAREAN SECTION     x 2  . COLONOSCOPY WITH PROPOFOL N/A 11/14/2018   Procedure: COLONOSCOPY WITH PROPOFOL;  Surgeon: TaVirgel ManifoldMD;  [place] Service: End[place];  Laterality: N/A;  . ESOPHAGOGASTRODUODENOSCOPY (EGD) WITH PROPOFOL N/A 11/14/2018   Procedure:  ESOPHAGOGASTRODUODENOSCOPY (EGD) WITH PROPOFOL;  Surgeon: TaVirgel ManifoldMD;  [place] Service: [place];  Laterality: N/A;  Latex allergy  . GIVENS CAPSULE STUDY N/A 12/27/2018   Procedure: GIVENS CAPSULE STUDY;  Surgeon: TaVirgel ManifoldMD;  Location: [facility] ENDOSCOPY;  Service: Endoscopy;  Laterality: N/A;    There were no vitals filed for this visit.   Subjective Assessment - 07/22/20 1346    Subjective Has psychiatry televisit today.    Currently in Pain? No/denies                 ADULT SLP TREATMENT - 07/22/20 1346      General Information   Behavior/Cognition Alert;Cooperative;Pleasant mood    HPI Catherine L. HaAldean Bakers a [age].o. 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 Past medical history is noted for anxiety, asthma, Graves disease, HTN.      Treatment Provided   Treatment provided Cognitive-Linquistic      Pain Assessment   Pain Assessment No/denies pain      Cognitive-Linquistic Treatment   Treatment focused on Cognition;Patient/family/caregiver education    Skilled Treatment Patient reports she was a day late  on her mortgage payment. She verbalized needing to set an alert in her phone for this and took the opportunity to add other recurring alerts for bills not on autopay. She has prepared list of questions/items to remember for her visit with psychiatry today. Wordfinding episodes x2 in conversation today; reviewed strategies for anomia and provided handout for this. Readministered CLQT for assessment of cognition; scores improved compared with baseline at time of evaluation; see scores below.      Assessment / Recommendations / Plan   Plan Continue with current plan of care      Progression Toward Goals   Progression toward goals Progressing toward goals        AGE - 18 - 69   Cognitive Linguistic Quick Test   The Cognitive Linguistic Quick  Test (CLQT) was administered to assess the relative status of five cognitive domains: attention, memory, language, executive functioning, and visuospatial skills. Scores from 10 tasks were used to estimate severity ratings (for age groups 18-69 years and 70-89 years) for each domain, a clock drawing task, as well as an overall composite severity rating of cognition.        Task    Score  Criterion Cut Scores   Personal Facts      8/8   8    Symbol Cancellation     12/12            11   Confrontation Naming 10/10   10   Clock Drawing  11/13   12   Story Retelling    5/10   6   Symbol Trails      10/10  9   Generative Naming      6/9   5   Design Memory      5/6   5   Mazes                   8/8   7   Design Generation    4/13   6   Cognitive Domain  Composite Score Severity Rating   Attention   194/215  WNL    Memory   142/185  Mild   Executive Function  28/40   WNL   Language   29/37   WNL   Visuospatial Skills  92/105  WNL   Clock Drawing  11/13   WNL   Composite Severity Rating WNL           SLP Education - 07/22/20 1350    Education Details compensations for anomia    Person(s) Educated Patient    Methods Explanation    Comprehension Verbalized understanding            SLP Short Term Goals - 07/01/20 1236      SLP SHORT TERM GOAL #1   Title Patient will obtain a system for recall/executive function and bring to at least 3 therapy sessions.    Time 4    Period Weeks    Status Achieved    Target Date 06/12/20      SLP SHORT TERM GOAL #2   Title Patient will report no forgotten items at the grocery store using compensations for recall.    Time 4    Period Weeks    Status Achieved            SLP Long Term Goals - 07/01/20 1153      SLP LONG TERM GOAL #1   Title Patient will use external aids/strategies to  run 2+ errands in a row.    Time 8    Period Weeks    Status Partially Met      SLP LONG TERM GOAL #2   Title Patient will report  no missed medications or bills 4 visits in a row using memory compensations.    Time 8    Period Weeks    Status On-going      SLP LONG TERM GOAL #3   Title Patient will use compensations for attention/information processing to complete mod complex cognitive linguistic tasks >85% accuracy.    Time 8    Period Weeks    Status Partially Met      SLP LONG TERM GOAL #4   Title Patient will improve communication function by self-advocating for accomodations/strategies in conversations outside Catherine Berg room.    Time 8    Period Weeks    Status New      SLP LONG TERM GOAL #5   Title Patient will use strategies for processing verbal information (rephrasing, active listening, taking notes, request repetition) in moderately complex conversation for >90% comprehension of details.    Time 8    Period Weeks    Status New            Plan - 07/22/20 1351    Clinical Impression Statement Patient demonstrating carryover of compensations for processing and recall by preparing for her MD appointment this week. Pt able to verbalize barriers and strategies to help her function better. Wordfinding episodes x2 today; anomia compensations reviewed. CLQT scores show improvement from overall mild impairment to WNL. Anticipate d/c next session. I recommend skilled ST to maximize carryover of strategies to improve her attention, recall and organization skills in order to improve function for return to work and home.    Speech Therapy Frequency 2x / week    Duration 8 weeks    Treatment/Interventions Environmental controls;Cueing hierarchy;SLP instruction and feedback;Cognitive reorganization;Compensatory techniques;Functional tasks;Compensatory strategies;Internal/external aids;Patient/family education    Potential to Achieve Goals Good    Consulted and Agree with Plan of Care Patient           Patient will benefit from skilled therapeutic intervention in order to improve the following deficits and impairments:    Cognitive communication deficit  COVID-19 long hauler manifesting chronic concentration deficit    Problem List Patient Active Problem List   Diagnosis Date Noted  . 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 Sleeps in sitting position due to orthopnea 07/02/2020  . 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 Vitamin D deficiency 04/22/2020  . 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 Memory loss 02/28/2020  . Physical deconditioning 02/28/2020  . Dyspnea on exertion 02/28/2020  . Neck pain, bilateral 02/28/2020  . Muscle spasms of neck 02/28/2020  . Gallstones 01/30/2020  . Arthralgia of hand 01/24/2019  . CRP elevated 01/24/2019  . ESR raised 01/24/2019  . Class 3 severe obesity due to excess calories without serious comorbidity in adult ([place]) 01/24/2019  . Palpitations 09/16/2018  . Coronary artery calcification 09/16/2018  . 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 Bilateral leg edema 04/20/2017  . Incidental lung nodule, > 54m and < 822m11/28/2018  . Hives 09/02/2015  . Morbid obesity ([facility]/13/2017  . Anxiety 07/09/2015  . 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 Thyroid nodule 02/01/2015  . Vitamin B12 deficiency   . History of abnormal cervical Pap smear   . Allergy-induced  asthma, mild intermittent, uncomplicated 88/93/3882   Deneise Lever, [place], CCC-SLP Speech-Language Pathologist   Aliene Altes 07/22/2020, 1:53 PM  [place] MAIN [facility] SERVICES [address] [place], [place], [address] Phone: [phone]   Fax:  [phone]   Name: LOUIS GAW MRN: [id number] Date of Birth: 01-22-1981

## 2020-07-24 ENCOUNTER — Ambulatory Visit: Payer: Managed Care, Other (non HMO) | Admitting: Speech Pathology

## 2020-07-24 ENCOUNTER — Other Ambulatory Visit: Payer: Self-pay

## 2020-07-24 DIAGNOSIS — R41840 Attention and concentration deficit: Secondary | ICD-10-CM

## 2020-07-24 DIAGNOSIS — R41841 Cognitive communication deficit: Secondary | ICD-10-CM | POA: Diagnosis not present

## 2020-07-24 NOTE — Therapy (Signed)
[place] MAIN [facility] SERVICES [address] [place], [place], [address] Phone: [phone]   Fax:  [phone]  Speech Language Pathology Treatment and Discharge Summary  Patient Details  Name: Catherine Berg MRN: [id number] Date of Birth: 04/06/1981 Referring Provider (SLP): Lazaro Arms, NP   Encounter Date: 07/24/2020   End of Session - 07/24/20 1330    Visit Number 17    Number of Visits 17    Date for SLP Re-Evaluation 09/29/20    Authorization Type [employer]    Authorization Time Period 06/24/20    Authorization - Visit Number 7    Progress Note Due on Visit 10    SLP Start Time 1122   pt arrived late   SLP Stop Time  1202    SLP Time Calculation (min) 40 min    Activity Tolerance Patient tolerated treatment well           Past Medical History:  Diagnosis Date  . Abnormal thyroid blood test   . Anemia   . Anxiety   . Asthma   . Complication of anesthesia    itching after c sections  . 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 Graves disease   . History of abnormal cervical Pap smear   . History of cervical polypectomy   . Hives 09/02/2015  . Hypertension   . IFG (impaired fasting glucose)   . Incidental lung nodule, > 74mm and < 40mm 03/31/2017   4 mm RLL lung nodule on chest CT Mar 31, 2017  . Low serum vitamin D   . Obesity   . Pregnancy induced hypertension    with 1st pregnancy, normal pressure with 2nd  . Reflux   . Thyroid disease   . Vitamin B12 deficiency   . Vitamin D deficiency disease   . Wears dentures    full upper    Past Surgical History:  Procedure Laterality Date  . CERVICAL POLYPECTOMY    . CESAREAN SECTION     x 2  . COLONOSCOPY WITH PROPOFOL N/A 11/14/2018   Procedure: COLONOSCOPY WITH PROPOFOL;  Surgeon: Virgel Manifold, MD;  Location: [place];  Service: Endoscopy;  Laterality: N/A;  . ESOPHAGOGASTRODUODENOSCOPY (EGD) WITH PROPOFOL N/A 11/14/2018   Procedure:  ESOPHAGOGASTRODUODENOSCOPY (EGD) WITH PROPOFOL;  Surgeon: Virgel Manifold, MD;  Location: [place];  Service: Endoscopy;  Laterality: N/A;  Latex allergy  . GIVENS CAPSULE STUDY N/A 12/27/2018   Procedure: GIVENS CAPSULE STUDY;  Surgeon: Virgel Manifold, MD;  Location: [facility] ENDOSCOPY;  Service: Endoscopy;  Laterality: N/A;    There were no vitals filed for this visit.   Subjective Assessment - 07/24/20 1321    Subjective "I thought about this day and I will try to incorporate all the things I have learned."    Currently in Pain? No/denies                 ADULT SLP TREATMENT - 07/24/20 1322      General Information   Behavior/Cognition Alert;Cooperative;Pleasant mood    HPI Catherine Berg is a 40 y.o. 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 Past medical history is noted for anxiety, asthma, Graves disease, HTN.      Treatment Provided   Treatment provided Cognitive-Linquistic      Pain Assessment   Pain Assessment No/denies pain      Cognitive-Linquistic Treatment   Treatment focused  on Cognition;Patient/family/caregiver education    Skilled Treatment Patient reports ready for d/c today. She reports having conversation with her brother and self-advocating to use memory strategy during their talk, and this was well-received. Had a good visit with psychiatry, verbally recalled details of appointment and follow-up instructions (med changes, referral/appointment to work with therapist). Pt verbalized ways she can use strategies outside of ST to help her function better. Readministered Multifactorial Memory Questionnaire ([place]) "Memory Mistakes"; pt scored raw score/t-score of 56 which is consistent with "average", an improvement from t score of 39 "below average" at time of evaluation. Pt reports continues to misplace her keys frequently; SLP educated on adaptive technology,  compensatory strategies to assist with this, and pt took note of this to aid recall. Reviewed recommendations for ongoing use of strategies; pt questions answered.      Assessment / Recommendations / Plan   Plan Discharge SLP treatment due to (comment)   goals met     Progression Toward Goals   Progression toward goals Goals met, education completed, patient discharged from SLP            SLP Education - 07/24/20 1330    Education Details see pt instructions    Person(s) Educated Patient    Methods Explanation    Comprehension Verbalized understanding            SLP Short Term Goals - 07/24/20 1128      SLP SHORT TERM GOAL #1   Title Patient will obtain a system for recall/executive function and bring to at least 3 therapy sessions.    Time 4    Period Weeks    Status Achieved    Target Date 06/12/20      SLP SHORT TERM GOAL #2   Title Patient will report no forgotten items at the grocery store using compensations for recall.    Time 4    Period Weeks    Status Achieved            SLP Long Term Goals - 07/24/20 1128      SLP LONG TERM GOAL #1   Title Patient will use external aids/strategies to run 2+ errands in a row.    Time 8    Period Weeks    Status Achieved      SLP LONG TERM GOAL #2   Title Patient will report no missed medications or bills 4 visits in a row using memory compensations.    Time 8    Period Weeks    Status Partially Met      SLP LONG TERM GOAL #3   Title Patient will use compensations for attention/information processing to complete mod complex cognitive linguistic tasks >85% accuracy.    Time 8    Period Weeks    Status Achieved      SLP LONG TERM GOAL #4   Title Patient will improve communication function by self-advocating for accomodations/strategies in conversations outside [place] room.    Time 8    Period Weeks    Status Achieved      SLP LONG TERM GOAL #5   Title Patient will use strategies for processing verbal information  (rephrasing, active listening, taking notes, request repetition) in moderately complex conversation for >90% comprehension of details.    Time 8    Period Weeks    Status Achieved            Plan - 07/24/20 1330    Clinical Impression Statement Patient in agreement  with d/c today. Continues to experience functional deficits with memory, language, and executive function, however pt is compensating quite well for these at this time. CLQT score improved from overall mild impairment to WNL; pt-reported outcome measure MMQ score improved from below average to average. Pt verbalizing use of strategies and self-advocating outside of ST room; 4/5 LTGs met (1 partially met) and she is in agreement with d/c at this time.    Speech Therapy Frequency --   d/c   Duration --   d/c   Treatment/Interventions Environmental controls;Cueing hierarchy;SLP instruction and feedback;Cognitive reorganization;Compensatory techniques;Functional tasks;Compensatory strategies;Internal/external aids;Patient/family education    Potential to Achieve Goals Good    Consulted and Agree with Plan of Care Patient           Patient will benefit from skilled therapeutic intervention in order to improve the following deficits and impairments:   Cognitive communication deficit  COVID-19 long hauler manifesting chronic concentration deficit    Problem List Patient Active Problem List   Diagnosis Date Noted  . 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 Sleeps in sitting position due to orthopnea 07/02/2020  . 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 Vitamin D deficiency 04/22/2020  . 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 Memory loss 02/28/2020  . Physical deconditioning 02/28/2020  . Dyspnea on exertion 02/28/2020  . Neck pain, bilateral 02/28/2020  . Muscle spasms of neck 02/28/2020  . Gallstones 01/30/2020  .  Arthralgia of hand 01/24/2019  . CRP elevated 01/24/2019  . ESR raised 01/24/2019  . Class 3 severe obesity due to excess calories without serious comorbidity in adult ([place]) 01/24/2019  . Palpitations 09/16/2018  . Coronary artery calcification 09/16/2018  . 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 Bilateral leg edema 04/20/2017  . Incidental lung nodule, > 64m and < 819m11/28/2018  . Hives 09/02/2015  . Morbid obesity ([facility]04/13/2017  . Anxiety 07/09/2015  . 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 Thyroid nodule 02/01/2015  . Vitamin B12 deficiency   . History of abnormal cervical Pap smear   . Allergy-induced asthma, mild intermittent, uncomplicated 0605/64/6980 SPEECH THERAPY DISCHARGE SUMMARY  Visits from Start of Care: 17  Current functional level related to goals / functional outcomes: 4/5 LTGs met; pt carrying over compensations for language, executive function, memory [place] of [place].    Remaining deficits: Standardized scores have improved however pt continues with mild functional deficits in language, executive function, and memory.   Education / Equipment: Strategies, compensations, accommodations  Plan: Patient agrees to discharge.  Patient goals were partially met. Patient is being discharged due to meeting the stated rehab goals.  ?????         MaDeneise LeverMSLafayetteCCC-SLP Speech-Language Pathologist  MaAliene Altes/23/2022, 1:34 PM  [facility] [id number]: [phone] Fax:  [phone] Name: TaNEVAEHA FINERTYRN: [id number] of Birth: 9/Oct 24, 1980

## 2020-07-24 NOTE — Patient Instructions (Signed)
Look into the Tile for your keys/phone  Try a post-it note (brightly colored) visible to you as soon as you walk in, to remind you of putting your keys on the ring.   Try a basket by the door. You can place items like keys, phone, wallet inside when you come in.You can also use this to gather/collect items you will need to take with you when you leave the house.   The main thing I want you to remember is to keep on using the strategies you've found helpful. Be consistent.  -Planning your meals/time -Planning your schedule daily and making adjustments as needed during the day -Asking for help or letting the other person know when you need a minute to collect your thoughts, or if you need to take a note, or need them to repeat something.  -Manage your expectations for yourself; try to plan your day so that you don't end up feeling overwhelmed.  -Take things one at time.

## 2020-08-01 ENCOUNTER — Ambulatory Visit (INDEPENDENT_AMBULATORY_CARE_PROVIDER_SITE_OTHER): Payer: 59 | Admitting: Licensed Clinical Social Worker

## 2020-08-01 ENCOUNTER — Encounter: Payer: Self-pay | Admitting: Family Medicine

## 2020-08-01 ENCOUNTER — Other Ambulatory Visit: Payer: Self-pay

## 2020-08-01 DIAGNOSIS — F411 Generalized anxiety disorder: Secondary | ICD-10-CM | POA: Diagnosis not present

## 2020-08-01 DIAGNOSIS — F331 Major depressive disorder, recurrent, moderate: Secondary | ICD-10-CM

## 2020-08-01 DIAGNOSIS — F4310 Post-traumatic stress disorder, unspecified: Secondary | ICD-10-CM

## 2020-08-02 NOTE — Progress Notes (Signed)
Virtual Visit via Video Note  I connected with Catherine Berg on 08/01/20 at  2:00 PM EDT by a video enabled telemedicine application and verified that I am speaking with the correct person using two identifiers.  Location: Patient: home Provider: [facility]   I discussed the limitations of evaluation and management by telemedicine and the availability of in person appointments. The patient expressed understanding and agreed to proceed.  I discussed the assessment and treatment plan with the patient. The patient was provided an opportunity to ask questions and all were answered. The patient agreed with the plan and demonstrated an understanding of the instructions.   The patient was advised to call back or seek an in-person evaluation if the symptoms worsen or if the condition fails to improve as anticipated.  I provided 60 minutes of non-face-to-face time during this encounter.   Chanin Frumkin R Kelcy Baeten, LCSW    THERAPIST PROGRESS NOTE  Session Time: 2-3p Participation Level: Active  Behavioral Response: Neat and Well GroomedAlertAnxious and Depressed  Type of Therapy: Individual Therapy  Treatment Goals addressed: Anxiety and Coping  Interventions: Supportive and Other: trauma-focused  Summary: Catherine Berg is a 40 y.o. female who presents with symptoms consistent with depression, anxiety, and PTSD. Pt reports that recently mood has been stable and that she is getting inconsistent quality and quantity of sleep.  Allowed pt to explore and express thoughts and feelings surrounding childhood-based trauma and loss of mother 6 years ago. The bulk of counseling session focused on overall psychological impact of childhood trauma and how it is continuing to impact pts functioning in the present.  Reviewed ways of managing stress on a daily basis and emailed resources.  Continued recommendations are as follows: self care behaviors, positive social engagements, focusing on overall  work/home/life balance, and focusing on positive physical and emotional wellness.   Suicidal/Homicidal: No  Therapist Response: Initial assessment and development of treatment plan.  Plan: Return again in 3 weeks. Complete CCA  Diagnosis: Axis I: Post Traumatic Stress Disorder    Axis II: No diagnosis    Ernest Haber Jasmia Angst, LCSW 08/02/2020

## 2020-08-20 ENCOUNTER — Ambulatory Visit (INDEPENDENT_AMBULATORY_CARE_PROVIDER_SITE_OTHER): Payer: Managed Care, Other (non HMO) | Admitting: Family Medicine

## 2020-08-20 ENCOUNTER — Encounter: Payer: Self-pay | Admitting: Family Medicine

## 2020-08-20 ENCOUNTER — Other Ambulatory Visit: Payer: Self-pay

## 2020-08-20 VITALS — BP 130/82 | HR 90 | Temp 97.9°F | Resp 16 | Ht 63.0 in | Wt 233.0 lb

## 2020-08-20 DIAGNOSIS — R238 Other skin changes: Secondary | ICD-10-CM

## 2020-08-20 DIAGNOSIS — M791 Myalgia, unspecified site: Secondary | ICD-10-CM

## 2020-08-20 DIAGNOSIS — R7982 Elevated C-reactive protein (CRP): Secondary | ICD-10-CM | POA: Diagnosis not present

## 2020-08-20 DIAGNOSIS — M2550 Pain in unspecified joint: Secondary | ICD-10-CM | POA: Diagnosis not present

## 2020-08-20 DIAGNOSIS — U099 Post covid-19 condition, unspecified: Secondary | ICD-10-CM | POA: Diagnosis not present

## 2020-08-20 DIAGNOSIS — R490 Dysphonia: Secondary | ICD-10-CM

## 2020-08-20 NOTE — Progress Notes (Signed)
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  She is under the care of [facility] shortly after, she has also been seen by neurologist  Dr. Brett Fairy  She has a history of asthma but worse since COVID-19 and has an appointment with pulmonologist coming up - Dr. Mortimer Fries, she also sees psychiatrist Dr. Jeronimo Norma - because depression got worse since [place] . She had PT, ST and sleep study done ( normal ) She continues to have fatigue, body aches, mental fogginess,  decrease in memory., SOB with activity  Lack of stamina, inability to focus and has to take notes constantly to be able to function ( learned skills from [place])  She has not been back to work yet, she is a Librarian, academic for [employer] and is not capable to do what her job requires. We will extend FMLA for next 3 months and may have to be long term depending on her symptoms that seems to no been improving, at times the pain seems to be getting worse and fatigue continues to be debilitating   Arthralgia: she has increase of sed rate, CRP, muscle pain, joint aches, post-exertional fatigue worse over the past month, she is already taking duloxetine, lyrica, tizanidine and celebrex. Discussed FMS, chronic fatigue syndrome, likely related COVID , however family history of RA ( mother ) and we will refer her back to Rheumatologist   Hoarseness: recurrent, this episodes is going on for the past two weeks, referral will be placed to see ENT  Patient Active Problem List   Diagnosis Date Noted  . 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 Sleeps in sitting position due to orthopnea 07/02/2020  . 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 Vitamin D deficiency 04/22/2020  . 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 Memory loss 02/28/2020  . Physical deconditioning 02/28/2020  . Dyspnea on exertion 02/28/2020  . Neck pain, bilateral 02/28/2020  . Muscle spasms of neck 02/28/2020  . Gallstones 01/30/2020  . Arthralgia of hand 01/24/2019  . CRP elevated 01/24/2019  . ESR raised 01/24/2019  . Class 3 severe obesity due to excess calories without serious comorbidity in adult ([place]) 01/24/2019  . Palpitations 09/16/2018  . Coronary artery calcification 09/16/2018  . 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 Bilateral leg edema 04/20/2017  . Incidental lung nodule, > 85mm and < 4mm 03/31/2017  . Hives 09/02/2015  . Morbid obesity ([place]) 08/15/2015  . Anxiety 07/09/2015  . 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 Thyroid nodule 02/01/2015  . Vitamin B12 deficiency   . History of abnormal cervical Pap smear   . Allergy-induced asthma, mild intermittent, uncomplicated [id number]/[id number]    Past Surgical History:  Procedure Laterality Date  . CERVICAL POLYPECTOMY    . CESAREAN SECTION     x 2  . COLONOSCOPY WITH PROPOFOL N/A 11/14/2018   Procedure: COLONOSCOPY WITH PROPOFOL;  Surgeon: Virgel Manifold, MD;  Location: [place];  Service: Endoscopy;  Laterality: N/A;  . ESOPHAGOGASTRODUODENOSCOPY (EGD) WITH PROPOFOL N/A 11/14/2018   Procedure: ESOPHAGOGASTRODUODENOSCOPY (EGD) WITH  PROPOFOL;  Surgeon: Virgel Manifold, MD;  Location: [place];  Service: Endoscopy;  Laterality: N/A;  Latex allergy  . GIVENS CAPSULE STUDY N/A 12/27/2018   Procedure: GIVENS CAPSULE STUDY;  Surgeon: Virgel Manifold, MD;  Location: [facility];  Service: Endoscopy;  Laterality: N/A;    Family History  Problem Relation Age of Onset  . Heart attack Mother 31  . Hypertension Mother   . Asthma Mother    . Cancer Mother        laryngeal  . Diabetes Mother        pre diabetic  . Heart disease Mother   . Mental illness Mother   . Alcohol abuse Mother   . Drug abuse Mother   . Depression Mother   . Anxiety disorder Mother   . Bipolar disorder Mother   . Learning disabilities Brother   . ADD / ADHD Brother   . Asthma Son   . Hyperlipidemia Brother   . Alcohol abuse Father   . Heart disease Maternal Grandmother        heart attack, pacemaker  . Heart attack Maternal Grandmother   . Hypertension Maternal Grandmother   . Hypertension Maternal Grandfather   . Heart disease Paternal Grandmother        couple of major open heart surgeries, leaking valves  . Hypertension Paternal Grandmother   . Hypertension Paternal Grandfather   . Breast cancer Neg Hx     Social History   Tobacco Use  . Smoking status: Never Smoker  . Smokeless tobacco: Never Used  Substance Use Topics  . Alcohol use: No    Alcohol/week: 0.0 standard drinks     Current Outpatient Medications:  .  albuterol (PROVENTIL HFA;VENTOLIN HFA) 108 (90 Base) MCG/ACT inhaler, Inhale 2 puffs into the lungs every 6 (six) hours as needed for wheezing or shortness of breath., Disp: 18 g, Rfl: 1 .  amphetamine-dextroamphetamine (ADDERALL XR) 15 MG 24 hr capsule, Take 1 capsule by mouth daily., Disp: 30 capsule, Rfl: 0 .  celecoxib (CELEBREX) 200 MG capsule, Take 1 capsule (200 mg total) by mouth daily after lunch., Disp: 30 capsule, Rfl: 0 .  clobetasol (TEMOVATE) 0.05 % external solution, Apply 1 application topically 2 (two) times daily., Disp: 50 mL, Rfl: 0 .  Cyanocobalamin (B-12) 1000 MCG SUBL, Place 1 tablet under the tongue daily., Disp: 30 each, Rfl: 5 .  DULoxetine (CYMBALTA) 30 MG capsule, 90 mg daily. Take along with 60 mg cap, Disp: 30 capsule, Rfl: 1 .  DULoxetine (CYMBALTA) 60 MG capsule, Take 1 capsule (60 mg total) by mouth in the morning. ily, Disp: 90 capsule, Rfl: 0 .  Elastic Bandages & Supports ([place]) MISC, Wear from morning to night, but do not sleep in these, Disp: 1 each, Rfl: 2 .  Etonogestrel (NEXPLANON [place]), Inject into the skin., Disp: , Rfl:  .  famotidine (PEPCID) 20 MG tablet, Take 1 tablet (20 mg total) by mouth daily., Disp: 30 tablet, Rfl: 1 .  fluticasone (FLONASE) 50 MCG/ACT nasal spray, Place 2 sprays into both nostrils daily., Disp: 16 g, Rfl: 2 .  hydrOXYzine (ATARAX/VISTARIL) 10 MG tablet, Take 1 tablet (10 mg total) by mouth 3 (three) times daily as needed., Disp: 30 tablet, Rfl: 0 .  levothyroxine (SYNTHROID) 50 MCG tablet, Take 75 mcg by mouth daily., Disp: , Rfl:  .  montelukast (SINGULAIR) 10 MG tablet, Take 1 tablet (10 mg total) by mouth at bedtime., Disp: 90 tablet,  Rfl: 1 .  pregabalin (LYRICA) 100 MG capsule, Take 1 capsule (100 mg total) by mouth at bedtime as needed., Disp: 90 capsule, Rfl: 0 .  tiZANidine (ZANAFLEX) 4 MG tablet, Take 0.5-1.5 tablets (2-6 mg total) by mouth every 8 (eight) hours as needed for muscle spasms (muscle tightness)., Disp: 90 tablet, Rfl: 2 .  traZODone (DESYREL) 50 MG tablet, Take 0.5-1 tablets (25-50 mg total) by mouth at bedtime as needed for sleep., Disp: 90 tablet, Rfl: 0 .  Vitamin D, Ergocalciferol, (DRISDOL) 1.25 MG (50000 UNIT) CAPS capsule, Take 1 capsule (50,000 Units total) by mouth every 7 (seven) days., Disp: 12 capsule, Rfl: 1  Allergies  Allergen Reactions  . Latex Hives    (Balloons, condoms, underwear elastic, gloves)  . Shellfish Allergy Hives    I personally reviewed active problem list, medication list, allergies, family history, social history, health maintenance with the patient/caregiver today.   ROS  Constitutional: Negative for fever , positive for weight change.  Respiratory: negative for cough but positive for  shortness of breath.   Cardiovascular: Negative for chest pain or palpitations.  Gastrointestinal: Negative for abdominal pain, no bowel changes.  Musculoskeletal:  Negative for gait problem or joint swelling.  Skin: Negative for rash.  Neurological: Negative for dizziness or headache.  No other specific complaints in a complete review of systems (except as listed in HPI above).  Objective  Vitals:   08/20/20 1141  BP: 130/82  Pulse: 90  Resp: 16  Temp: 97.9 F (36.6 C)  TempSrc: Oral  SpO2: 99%  Weight: 233 lb (105.7 kg)  Height: _0  (1.6 m)    Body mass index is 41.27 kg/m.  Physical Exam  Constitutional: Patient appears well-developed and well-nourished. Obese  No distress.  HEENT: head atraumatic, normocephalic, pupils equal and reactive to light,neck supple, hoarseness  Cardiovascular: Normal rate, regular rhythm and normal heart sounds.  No murmur heard. No BLE edema. Pulmonary/Chest: Effort normal and breath sounds normal. No respiratory distress. Abdominal: Soft.  There is no tenderness. Muscular Skeletal: trigger point positive  Psychiatric: Patient has a normal mood and affect. behavior is normal. Judgment and thought content normal.  Recent Results (from the past 2160 hour(s))  COMPLETE METABOLIC PANEL WITH GFR     Status: None   Collection Time: 07/17/20  9:41 AM  Result Value Ref Range   Glucose, Bld 87 65 - 99 mg/dL    Comment: .            Fasting reference interval .    BUN 16 7 - 25 mg/dL   Creat 0.75 0.50 - 1.10 mg/dL   GFR, Est Non African American 100 > OR = 60 mL/min/1.34m   GFR, Est African American 116 > OR = 60 mL/min/1.760m  BUN/Creatinine Ratio NOT APPLICABLE 6 - 22 (calc)   Sodium 137 135 - 146 mmol/L   Potassium 4.9 3.5 - 5.3 mmol/L   Chloride 104 98 - 110 mmol/L   CO2 26 20 - 32 mmol/L   Calcium 9.6 8.6 - 10.2 mg/dL   Total Protein 7.3 6.1 - 8.1 g/dL   Albumin 4.0 3.6 - 5.1 g/dL   Globulin 3.3 1.9 - 3.7 g/dL (calc)   AG Ratio 1.2 1.0 - 2.5 (calc)   Total Bilirubin 0.4 0.2 - 1.2 mg/dL   Alkaline phosphatase (APISO) 67 31 - 125 U/L   AST 19 10 - 30 U/L   ALT 19 6 - 29 U/L  CBC with  Differential/Platelet  Status: None   Collection Time: 07/17/20  9:41 AM  Result Value Ref Range   WBC 5.8 3.8 - 10.8 Thousand/uL   RBC 4.44 3.80 - 5.10 Million/uL   Hemoglobin 12.5 11.7 - 15.5 g/dL   HCT 38.5 35.0 - 45.0 %   MCV 86.7 80.0 - 100.0 fL   MCH 28.2 27.0 - 33.0 pg   MCHC 32.5 32.0 - 36.0 g/dL   RDW 13.0 11.0 - 15.0 %   Platelets 331 140 - 400 Thousand/uL   MPV 10.0 7.5 - 12.5 fL   Neutro Abs 2,825 1,500 - 7,800 cells/uL   Lymphs Abs 2,477 850 - 3,900 cells/uL   Absolute Monocytes 383 200 - 950 cells/uL   Eosinophils Absolute 99 15 - 500 cells/uL   Basophils Absolute 17 0 - 200 cells/uL   Neutrophils Relative % 48.7 %   Total Lymphocyte 42.7 %   Monocytes Relative 6.6 %   Eosinophils Relative 1.7 %   Basophils Relative 0.3 %  Hemoglobin A1c     Status: None   Collection Time: 07/17/20  9:41 AM  Result Value Ref Range   Hgb A1c MFr Bld 5.2 <5.7 % of total Hgb    Comment: For the purpose of screening for the presence of diabetes: . <5.7%       Consistent with the absence of diabetes 5.7-6.4%    Consistent with increased risk for diabetes             (prediabetes) > or =6.5%  Consistent with diabetes . This assay result is consistent with a decreased risk of diabetes. . Currently, no consensus exists regarding use of hemoglobin A1c for diagnosis of diabetes in children. . According to American Diabetes Association (ADA) guidelines, hemoglobin A1c <7.0% represents optimal control in non-pregnant diabetic patients. Different metrics may apply to specific patient populations.  Standards of Medical Care in Diabetes(ADA). .    Mean Plasma Glucose 103 mg/dL   eAG (mmol/L) 5.7 mmol/L  Vitamin B12     Status: None   Collection Time: 07/17/20  9:41 AM  Result Value Ref Range   Vitamin B-12 291 200 - 1,100 pg/mL    Comment: . Please Note: Although the reference range for vitamin B12 is [phone]) 200-6054 pg/mL, it has been reported that between 5 and 10% of patients  with values between 200 and 400 pg/mL may experience neuropsychiatric and hematologic abnormalities due to occult B12 deficiency; less than 1% of patients with values above 400 pg/mL will have symptoms. Marland Kitchen   VITAMIN D 25 Hydroxy (Vit-D Deficiency, Fractures)     Status: Abnormal   Collection Time: 07/17/20  9:41 AM  Result Value Ref Range   Vit D, 25-Hydroxy 18 (L) 30 - 100 ng/mL    Comment: Vitamin D Status         25-OH Vitamin D: . Deficiency:                    <20 ng/mL Insufficiency:             20 - 29 ng/mL Optimal:                 > or = 30 ng/mL . For 25-OH Vitamin D testing on patients on  D2-supplementation and patients for whom quantitation  of D2 and D3 fractions is required, the [facility](TM) 25-OH VIT D, (D2,D3), LC/MS/MS is recommended: order  code [id number] (patients >71yrs). See Note 1 . Note 1 . For additional information, please  refer to  http://education.[facility].com/faq/FAQ199  (This link is being provided for informational/ educational purposes only.)   C-reactive protein     Status: Abnormal   Collection Time: 07/17/20  9:41 AM  Result Value Ref Range   CRP 18.1 (H) <8.0 mg/L  Sedimentation rate     Status: Abnormal   Collection Time: 07/17/20  9:41 AM  Result Value Ref Range   Sed Rate 31 (H) 0 - 20 mm/h  Lipid panel     Status: None   Collection Time: 07/17/20  9:41 AM  Result Value Ref Range   Cholesterol 113 <200 mg/dL   HDL 57 > OR = 50 mg/dL   Triglycerides 53 <150 mg/dL   LDL Cholesterol (Calc) 43 mg/dL (calc)    Comment: Reference range: <100 . Desirable range <100 mg/dL for primary prevention;   <70 mg/dL for patients with CHD or diabetic patients  with > or = 2 CHD risk factors. Marland Kitchen LDL-C is now calculated using the Martin-Hopkins  calculation, which is a validated novel method providing  better accuracy than the Friedewald equation in the  estimation of LDL-C.  Cresenciano Genre et al. Annamaria Helling. 5093;267(12): 2061-2068   (http://education.[facility].com/faq/FAQ164)    Total CHOL/HDL Ratio 2.0 <5.0 (calc)   Non-HDL Cholesterol (Calc) 56 <130 mg/dL (calc)    Comment: For patients with diabetes plus 1 major ASCVD risk  factor, treating to a non-HDL-C goal of <100 mg/dL  (LDL-C of <70 mg/dL) is considered a therapeutic  option.   TSH     Status: None   Collection Time: 07/17/20  9:41 AM  Result Value Ref Range   TSH 4.13 mIU/L    Comment:           Reference Range .           > or = 20 Years  0.40-4.50 .                Pregnancy Ranges           First trimester    0.26-2.66           Second trimester   0.55-2.73           Third trimester    0.43-2.91   Vitamin D (25 hydroxy)     Status: Abnormal   Collection Time: 07/17/20 10:25 AM  Result Value Ref Range   Vit D, 25-Hydroxy 14.1 (L) 30.0 - 100.0 ng/mL    Comment: Vitamin D deficiency has been defined by the [place] practice guideline as a level of serum 25-OH vitamin D less than 20 ng/mL (1,2). The Endocrine Society went on to further define vitamin D insufficiency as a level between 21 and 29 ng/mL (2). 1. IOM (Institute of Medicine). 2010. Dietary reference    intakes for calcium and D. [place]: The    [employer]. 2. Holick MF, Binkley Geiger, Bischoff-Ferrari HA, et al.    Evaluation, treatment, and prevention of vitamin D    deficiency: an Endocrine Society clinical practice    guideline. JCEM. 2011 Jul; 96(7):1911-30.   CBC     Status: None   Collection Time: 07/17/20 10:25 AM  Result Value Ref Range   WBC 5.9 3.4 - 10.8 x10E3/uL   RBC 4.50 3.77 - 5.28 x10E6/uL   Hemoglobin 12.6 11.1 - 15.9 g/dL   Hematocrit 38.3 34.0 - 46.6 %   MCV 85 79 - 97 fL   MCH 28.0 26.6 - 33.0  pg   MCHC 32.9 31.5 - 35.7 g/dL   RDW 12.9 11.7 - 15.4 %   Platelets 343 150 - 450 x10E3/uL  Comprehensive metabolic panel     Status: None   Collection Time: 07/17/20 10:25 AM  Result Value Ref Range    Glucose 92 65 - 99 mg/dL   BUN 14 6 - 24 mg/dL   Creatinine, Ser 0.85 0.57 - 1.00 mg/dL   eGFR 89 >59 mL/min/1.73   BUN/Creatinine Ratio 16 9 - 23   Sodium 137 134 - 144 mmol/L   Potassium 5.2 3.5 - 5.2 mmol/L   Chloride 101 96 - 106 mmol/L   CO2 20 20 - 29 mmol/L   Calcium 9.6 8.7 - 10.2 mg/dL   Total Protein 7.9 6.0 - 8.5 g/dL   Albumin 4.3 3.8 - 4.8 g/dL   Globulin, Total 3.6 1.5 - 4.5 g/dL   Albumin/Globulin Ratio 1.2 1.2 - 2.2   Bilirubin Total 0.4 0.0 - 1.2 mg/dL   Alkaline Phosphatase 77 44 - 121 IU/L   AST 18 0 - 40 IU/L   ALT 20 0 - 32 IU/L      PHQ2/9: Depression screen [facility] 2/9 08/20/2020 07/17/2020 07/03/2020 06/10/2020 05/06/2020  Decreased Interest _0 Down, Depressed, Hopeless _1 PHQ - 2 Score _2 Altered sleeping _3 Tired, decreased energy _4 Change in appetite 0 0 _5 Feeling bad or failure about yourself  _6 Trouble concentrating 0 0 0 0 0  Moving slowly or fidgety/restless 0 0 0 0 0  Suicidal thoughts 1 0 0 0 0  PHQ-9 Score _7 Difficult doing work/chores - - - Very difficult -  Some encounter information is confidential and restricted. Go to Review Flowsheets activity to see all data.  Some recent data might be hidden    phq 9 is positive   Fall Risk: Fall Risk  08/20/2020 07/17/2020 06/10/2020 05/06/2020 03/06/2020  Falls in the past year? 0 _8 0  Number falls in past yr: 0 0 0 0 0  Injury with Fall? 0 1 0 0 0  Comment - - - - -  Follow up - - - - -     Functional Status Survey: Is the patient deaf or have difficulty hearing?: No Does the patient have difficulty seeing, even when wearing glasses/contacts?: No Does the patient have difficulty concentrating, remembering, or making decisions?: Yes Does the patient have difficulty walking or climbing stairs?: No Does the patient have difficulty dressing or bathing?: No Does the patient have difficulty doing errands alone such as visiting a  doctor's office or shopping?: No    Assessment & Plan  1. Elevated C-reactive protein (CRP)  - C-reactive protein - Sedimentation rate - ANA,IFA RA Diag Pnl w/rflx Tit/Patn - Ambulatory referral to Rheumatology  2. COVID-19 long hauler  - C-reactive protein - Sedimentation rate - ANA,IFA RA Diag Pnl w/rflx Tit/Patn - Ambulatory referral to Rheumatology  3. Muscle pain  - C-reactive protein - Sedimentation rate - ANA,IFA RA Diag Pnl w/rflx Tit/Patn - Ambulatory referral to Rheumatology  4. Arthralgia, unspecified joint  - C-reactive protein - Sedimentation rate - ANA,IFA RA Diag Pnl w/rflx Tit/Patn - Ambulatory referral to Rheumatology  5. Skin sensitivity  - C-reactive protein - Sedimentation rate - ANA,IFA RA  Diag Pnl w/rflx Tit/Patn - Ambulatory referral to Rheumatology  6. Hoarseness  - Ambulatory referral to ENT
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
## 2020-08-22 ENCOUNTER — Encounter: Payer: Self-pay | Admitting: Family Medicine

## 2020-08-22 ENCOUNTER — Other Ambulatory Visit: Payer: Self-pay | Admitting: Family Medicine

## 2020-08-22 DIAGNOSIS — R490 Dysphonia: Secondary | ICD-10-CM

## 2020-08-23 LAB — SEDIMENTATION RATE: Sed Rate: 29 mm/h — ABNORMAL HIGH (ref 0–20)

## 2020-08-23 LAB — ANA,IFA RA DIAG PNL W/RFLX TIT/PATN
Anti Nuclear Antibody (ANA): NEGATIVE
Cyclic Citrullin Peptide Ab: <16 UNITS
Rheumatoid fact SerPl-aCnc: <14 IU/mL (ref <14)

## 2020-08-23 LAB — C-REACTIVE PROTEIN: CRP: 13.4 mg/L — ABNORMAL HIGH (ref <8.0)

## 2020-08-26 ENCOUNTER — Ambulatory Visit (INDEPENDENT_AMBULATORY_CARE_PROVIDER_SITE_OTHER): Payer: Managed Care, Other (non HMO) | Admitting: Licensed Clinical Social Worker

## 2020-08-26 ENCOUNTER — Other Ambulatory Visit: Payer: Self-pay

## 2020-08-26 DIAGNOSIS — F4310 Post-traumatic stress disorder, unspecified: Secondary | ICD-10-CM

## 2020-08-26 NOTE — Progress Notes (Signed)
Virtual Visit via Video Note  I connected with Catherine Berg on 08/26/20 at 11:00 AM EDT by a video enabled telemedicine application and verified that I am speaking with the correct person using two identifiers.  Location: Patient: home Provider: remote office [place], [place])   I discussed the limitations of evaluation and management by telemedicine and the availability of in person appointments. The patient expressed understanding and agreed to proceed.   I discussed the assessment and treatment plan with the patient. The patient was provided an opportunity to ask questions and all were answered. The patient agreed with the plan and demonstrated an understanding of the instructions.   The patient was advised to call back or seek an in-person evaluation if the symptoms worsen or if the condition fails to improve as anticipated.  I provided 45 minutes of non-face-to-face time during this encounter.   Tama Grosz R Deontrey Massi, LCSW    THERAPIST PROGRESS NOTE  Session Time: 11:15a-12p  Participation Level: Active  Behavioral Response: Neat and Well GroomedAlertDepressed  Type of Therapy: Individual Therapy  Treatment Goals addressed: Coping  Interventions: CBT and Reframing  Summary: Catherine Berg is a 40 y.o. female who presents with symptoms consistent with PTSD.  Pt reports that overall mood fluctuates sometimes situationally. Pt feels that for the majority of the time, mood has been stable. Pt reports that sleep quality and quantity is inconsistent.   Allowed pt to explore and express thoughts and feelings about recent life stressors--pt feels that she is managing stress well. Discussed relationship with son and son's school--pt feels that she knows her son is not doing as well as he could be doing, and feels that the teachers are not as understanding of his ADHD as they should be. Pt reports that son has 504 and that pt has to still check behind teachers and remind them to  unlock closed assignments so that he can turn them in.   Discussed nighttime eating behaviors--pt getting up in the middle of the night x 5 and will eat snacks each time. Discussed behavior modification plan and pt is on board. Goals are to decrease number of times pt is getting up in the middle of the night to snack.  Continued recommendations are as follows: self care behaviors, positive social engagements, focusing on overall work/home/life balance, and focusing on positive physical and emotional wellness.    Suicidal/Homicidal: No  Therapist Response: Catherine Berg is continuing to develop strategies to reduce anxiety symptoms and improve coping skills. Catherine Berg is compliant with treatment recommendations. Catherine Berg is working hard to recognize an understanding of how thoughts, feelings, and behaviors contribute to anxiety and behavior management. These behaviors are reflective of both personal growth and progress. Treatment to continue as indicated.   Plan: Return again in 4 weeks.  Diagnosis: Axis I: PTSD    Axis II: No diagnosis    Ernest Haber Zoua Caporaso, LCSW 08/26/2020

## 2020-08-27 ENCOUNTER — Encounter: Payer: Self-pay | Admitting: Psychiatry

## 2020-08-27 ENCOUNTER — Telehealth (INDEPENDENT_AMBULATORY_CARE_PROVIDER_SITE_OTHER): Payer: Managed Care, Other (non HMO) | Admitting: Psychiatry

## 2020-08-27 DIAGNOSIS — F411 Generalized anxiety disorder: Secondary | ICD-10-CM | POA: Diagnosis not present

## 2020-08-27 DIAGNOSIS — F4310 Post-traumatic stress disorder, unspecified: Secondary | ICD-10-CM | POA: Diagnosis not present

## 2020-08-27 DIAGNOSIS — F331 Major depressive disorder, recurrent, moderate: Secondary | ICD-10-CM | POA: Diagnosis not present

## 2020-08-27 MED ORDER — BUPROPION HCL ER (XL) 150 MG PO TB24: 150.0000 mg | ORAL_TABLET | Freq: Every day | ORAL | 0 refills | Status: DC

## 2020-08-27 MED ORDER — DULOXETINE HCL 30 MG PO CPEP: ORAL_CAPSULE | ORAL | 1 refills | Status: DC

## 2020-08-27 NOTE — Patient Instructions (Signed)
1. Continue duloxetine 90 mg daily 2. Start bupropion 150 mg daily  3. Next appointment: 5/27 at 10:30

## 2020-09-01 ENCOUNTER — Emergency Department: Payer: Managed Care, Other (non HMO)

## 2020-09-01 ENCOUNTER — Emergency Department
Admission: EM | Admit: 2020-09-01 | Discharge: 2020-09-01 | Disposition: A | Discharge status: Home / Self Care | Payer: Managed Care, Other (non HMO) | Attending: Emergency Medicine | Admitting: Emergency Medicine

## 2020-09-01 DIAGNOSIS — I10 Essential (primary) hypertension: Secondary | ICD-10-CM | POA: Insufficient documentation

## 2020-09-01 DIAGNOSIS — Z91040 Latex allergy status: Secondary | ICD-10-CM | POA: Diagnosis not present

## 2020-09-01 DIAGNOSIS — R1011 Right upper quadrant pain: Secondary | ICD-10-CM | POA: Diagnosis present

## 2020-09-01 DIAGNOSIS — Z79899 Other long term (current) drug therapy: Secondary | ICD-10-CM | POA: Insufficient documentation

## 2020-09-01 DIAGNOSIS — R112 Nausea with vomiting, unspecified: Secondary | ICD-10-CM

## 2020-09-01 DIAGNOSIS — K8050 Calculus of bile duct without cholangitis or cholecystitis without obstruction: Secondary | ICD-10-CM

## 2020-09-01 DIAGNOSIS — E890 Postprocedural hypothyroidism: Secondary | ICD-10-CM | POA: Insufficient documentation

## 2020-09-01 DIAGNOSIS — Z8616 Personal history of COVID-19: Secondary | ICD-10-CM | POA: Insufficient documentation

## 2020-09-01 DIAGNOSIS — J4520 Mild intermittent asthma, uncomplicated: Secondary | ICD-10-CM | POA: Insufficient documentation

## 2020-09-01 DIAGNOSIS — K8020 Calculus of gallbladder without cholecystitis without obstruction: Secondary | ICD-10-CM | POA: Diagnosis not present

## 2020-09-01 LAB — CBC WITH DIFFERENTIAL/PLATELET
Abs Immature Granulocytes: 0.01 10*3/uL (ref 0.00–0.07)
Basophils Absolute: 0 10*3/uL (ref 0.0–0.1)
Basophils Relative: 1 %
Eosinophils Absolute: 0.2 10*3/uL (ref 0.0–0.5)
Eosinophils Relative: 3 %
HCT: 36.5 % (ref 36.0–46.0)
Hemoglobin: 12.1 g/dL (ref 12.0–15.0)
Immature Granulocytes: 0 %
Lymphocytes Relative: 54 %
Lymphs Abs: 4.3 10*3/uL — ABNORMAL HIGH (ref 0.7–4.0)
MCH: 29 pg (ref 26.0–34.0)
MCHC: 33.2 g/dL (ref 30.0–36.0)
MCV: 87.5 fL (ref 80.0–100.0)
Monocytes Absolute: 0.7 10*3/uL (ref 0.1–1.0)
Monocytes Relative: 9 %
Neutro Abs: 2.6 10*3/uL (ref 1.7–7.7)
Neutrophils Relative %: 33 %
Platelets: 298 10*3/uL (ref 150–400)
RBC: 4.17 MIL/uL (ref 3.87–5.11)
RDW: 13.3 % (ref 11.5–15.5)
WBC: 7.7 10*3/uL (ref 4.0–10.5)
nRBC: 0 % (ref 0.0–0.2)

## 2020-09-01 LAB — COMPREHENSIVE METABOLIC PANEL
ALT: 20 U/L (ref 0–44)
AST: 28 U/L (ref 15–41)
Albumin: 3.6 g/dL (ref 3.5–5.0)
Alkaline Phosphatase: 60 U/L (ref 38–126)
Anion gap: 7 (ref 5–15)
BUN: 14 mg/dL (ref 6–20)
CO2: 21 mmol/L — ABNORMAL LOW (ref 22–32)
Calcium: 8.9 mg/dL (ref 8.9–10.3)
Chloride: 108 mmol/L (ref 98–111)
Creatinine, Ser: 0.85 mg/dL (ref 0.44–1.00)
GFR, Estimated: >60 mL/min (ref >60)
Glucose, Bld: 114 mg/dL — ABNORMAL HIGH (ref 70–99)
Potassium: 4.3 mmol/L (ref 3.5–5.1)
Sodium: 136 mmol/L (ref 135–145)
Total Bilirubin: 0.7 mg/dL (ref 0.3–1.2)
Total Protein: 7.4 g/dL (ref 6.5–8.1)

## 2020-09-01 LAB — TROPONIN I (HIGH SENSITIVITY): Troponin I (High Sensitivity): <2 ng/L (ref <18)

## 2020-09-01 LAB — LIPASE, BLOOD: Lipase: 44 U/L (ref 11–51)

## 2020-09-01 MED ORDER — FAMOTIDINE IN NACL 20-0.9 MG/50ML-% IV SOLN
20.0000 mg | Freq: Once | INTRAVENOUS | Status: AC
  Administered 2020-09-01: 20 mg via INTRAVENOUS
  Filled 2020-09-01: qty 50

## 2020-09-01 MED ORDER — ONDANSETRON 4 MG PO TBDP: 4.0000 mg | ORAL_TABLET | Freq: Three times a day (TID) | ORAL | 0 refills | Status: DC | PRN

## 2020-09-01 MED ORDER — HYDROMORPHONE HCL 1 MG/ML IJ SOLN
0.5000 mg | Freq: Once | INTRAMUSCULAR | Status: AC
  Filled 2020-09-01: qty 1

## 2020-09-01 MED ORDER — SODIUM CHLORIDE 0.9 % IV BOLUS
1000.0000 mL | Freq: Once | INTRAVENOUS | Status: AC
  Administered 2020-09-01: 1000 mL via INTRAVENOUS

## 2020-09-01 MED ORDER — HYDROMORPHONE HCL 1 MG/ML IJ SOLN
INTRAMUSCULAR | Status: AC
  Administered 2020-09-01: 0.5 mg via INTRAVENOUS
  Filled 2020-09-01: qty 1

## 2020-09-01 MED ORDER — FENTANYL CITRATE (PF) 100 MCG/2ML IJ SOLN
50.0000 ug | Freq: Once | INTRAMUSCULAR | Status: DC
Start: 2020-09-01 — End: 2020-09-01

## 2020-09-01 MED ORDER — OXYCODONE-ACETAMINOPHEN 5-325 MG PO TABS: 1.0000 | ORAL_TABLET | ORAL | 0 refills | Status: DC | PRN

## 2020-09-01 MED ORDER — ONDANSETRON HCL 4 MG/2ML IJ SOLN
4.0000 mg | Freq: Once | INTRAMUSCULAR | Status: AC
  Administered 2020-09-01: 4 mg via INTRAVENOUS
  Filled 2020-09-01: qty 2

## 2020-09-01 NOTE — ED Triage Notes (Signed)
r flank/RUQ pain since 0400 with vomit x1. Reports hx gallstones 58yrs ago w/o further eval. Pt uncomfortable and unable to sit still

## 2020-09-01 NOTE — ED Provider Notes (Signed)
[facility] Emergency Department Provider Note   ____________________________________________   Event Date/Time   First MD Initiated Contact with Patient 09/01/20 0522     (approximate)  I have reviewed the triage vital signs and the nursing notes.   HISTORY  Chief Complaint Flank Pain    HPI Catherine Berg is a 40 y.o. female who presents to the ED from home with a chief complaint of abdominal pain.  Patient awoke approximately 4 AM with epigastric/right upper quadrant abdominal pain radiating to her right flank.  Ate hotdogs for supper.  Symptoms associated with nausea and vomiting.  Denies fever, cough, chest pain, shortness of breath, dysuria or diarrhea.  States history of gallstones 15 years ago; still retains her gallbladder.     Past Medical History:  Diagnosis Date  . Abnormal thyroid blood test   . Anemia   . Anxiety   . Asthma   . Complication of anesthesia    itching after c sections  . 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 Graves disease   . History of abnormal cervical Pap smear   . History of cervical polypectomy   . Hives 09/02/2015  . Hypertension   . IFG (impaired fasting glucose)   . Incidental lung nodule, > 39m and < 848m11/28/2018   4 mm RLL lung nodule on chest CT Mar 31, 2017  . Low serum vitamin D   . Obesity   . Pregnancy induced hypertension    with 1st pregnancy, normal pressure with 2nd  . Reflux   . Thyroid disease   . Vitamin B12 deficiency   . Vitamin D deficiency disease   . Wears dentures    full upper    Patient Active Problem List   Diagnosis Date Noted  . 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 Sleeps in sitting position due to orthopnea 07/02/2020  . 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 Vitamin D deficiency 04/22/2020  . 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  Memory loss 02/28/2020  . Physical deconditioning 02/28/2020  . Dyspnea on exertion 02/28/2020  . Neck pain, bilateral 02/28/2020  . Muscle spasms of neck 02/28/2020  . Gallstones 01/30/2020  . Arthralgia of hand 01/24/2019  . CRP elevated 01/24/2019  . ESR raised 01/24/2019  . Class 3 severe obesity due to excess calories without serious comorbidity in adult ([place]/22/2020  . Palpitations 09/16/2018  . Coronary artery calcification 09/16/2018  . 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 Bilateral leg edema 04/20/2017  . Incidental lung nodule, > 54m22mnd < 8mm254m/28/2018  . Hives 09/02/2015  . Morbid obesity (HCC)[place]/13/2017  . Anxiety 07/09/2015  . 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 Thyroid nodule 02/01/2015  . Vitamin B12 deficiency   . History of abnormal cervical Pap smear   . Allergy-induced asthma, mild intermittent, uncomplicated 06/030/86/5784Past Surgical History:  Procedure Laterality Date  . CERVICAL POLYPECTOMY    . CESAREAN SECTION     x 2  . COLONOSCOPY WITH PROPOFOL N/A 11/14/2018   Procedure: COLONOSCOPY WITH PROPOFOL;  Surgeon: TahiVirgel Manifold;  [facility] [facility] [place];  Laterality: N/A;  . ESOPHAGOGASTRODUODENOSCOPY (EGD) WITH PROPOFOL N/A 11/14/2018   Procedure: ESOPHAGOGASTRODUODENOSCOPY (EGD) WITH PROPOFOL;  Surgeon: TahiVirgel Manifold;  [place] [facility];  Laterality: N/A;  Latex allergy  .  GIVENS CAPSULE STUDY N/A 12/27/2018   Procedure: GIVENS CAPSULE STUDY;  Surgeon: Virgel Manifold, MD;  Location: [facility] ENDOSCOPY;  Service: Endoscopy;  Laterality: N/A;    Prior to Admission medications   Medication Sig Start Date End Date Taking? Authorizing Provider  ondansetron (ZOFRAN ODT) 4 MG disintegrating tablet Take 1 tablet (4 mg total) by mouth every 8 (eight) hours as needed for nausea or vomiting. 09/01/20  Yes Paulette Blanch, MD  oxyCODONE-acetaminophen (PERCOCET/ROXICET)  5-325 MG tablet Take 1 tablet by mouth every 4 (four) hours as needed for severe pain. 09/01/20  Yes Paulette Blanch, MD  albuterol (PROVENTIL HFA;VENTOLIN HFA) 108 (90 Base) MCG/ACT inhaler Inhale 2 puffs into the lungs every 6 (six) hours as needed for wheezing or shortness of breath. 06/28/18   Laverle Hobby, MD  amphetamine-dextroamphetamine (ADDERALL XR) 15 MG 24 hr capsule Take 1 capsule by mouth daily. 06/10/20   Steele Sizer, MD  buPROPion (WELLBUTRIN XL) 150 MG 24 hr tablet Take 1 tablet (150 mg total) by mouth daily. 08/28/20 09/27/20  Norman Clay, MD  celecoxib (CELEBREX) 200 MG capsule Take 1 capsule (200 mg total) by mouth daily after lunch. 07/17/20   Steele Sizer, MD  clobetasol (TEMOVATE) 0.05 % external solution Apply 1 application topically 2 (two) times daily. 10/31/19   Steele Sizer, MD  Cyanocobalamin (B-12) 1000 MCG SUBL Place 1 tablet under the tongue daily. 10/05/18   Steele Sizer, MD  DULoxetine (CYMBALTA) 30 MG capsule 90 mg daily. Take along with 60 mg cap 09/21/20   Norman Clay, MD  DULoxetine (CYMBALTA) 60 MG capsule Take 1 capsule (60 mg total) by mouth in the morning. ily 07/17/20   Steele Sizer, MD  Elastic Bandages & Supports ([place]) MISC Wear from morning to night, but do not sleep in these 04/20/17   Lada, Satira Anis, MD  Etonogestrel (NEXPLANON [place]) Inject into the skin.    [provider]  famotidine (PEPCID) 20 MG tablet Take 1 tablet (20 mg total) by mouth daily. 04/20/19   Virgel Manifold, MD  fluticasone (FLONASE) 50 MCG/ACT nasal spray Place 2 sprays into both nostrils daily. 01/05/19   Steele Sizer, MD  hydrOXYzine (ATARAX/VISTARIL) 10 MG tablet Take 1 tablet (10 mg total) by mouth 3 (three) times daily as needed. 06/27/20   Fenton Foy, NP  levothyroxine (SYNTHROID) 50 MCG tablet Take 75 mcg by mouth daily. 11/13/18   Lonia Farber, MD  montelukast (SINGULAIR) 10 MG tablet Take 1 tablet (10 mg total)  by mouth at bedtime. 06/10/20   Steele Sizer, MD  pregabalin (LYRICA) 100 MG capsule Take 1 capsule (100 mg total) by mouth at bedtime as needed. 07/17/20   Steele Sizer, MD  tiZANidine (ZANAFLEX) 4 MG tablet Take 0.5-1.5 tablets (2-6 mg total) by mouth every 8 (eight) hours as needed for muscle spasms (muscle tightness). 09/12/19   Delsa Grana, PA-C  traZODone (DESYREL) 50 MG tablet Take 0.5-1 tablets (25-50 mg total) by mouth at bedtime as needed for sleep. 07/17/20   Steele Sizer, MD  Vitamin D, Ergocalciferol, (DRISDOL) 1.25 MG (50000 UNIT) CAPS capsule Take 1 capsule (50,000 Units total) by mouth every 7 (seven) days. 07/17/20   Steele Sizer, MD    Allergies Latex and Shellfish allergy  Family History  Problem Relation Age of Onset  . Heart attack Mother 107  . Hypertension Mother   . Asthma Mother   . Cancer Mother        laryngeal  .  Diabetes Mother        pre diabetic  . Heart disease Mother   . Mental illness Mother   . Alcohol abuse Mother   . Drug abuse Mother   . Depression Mother   . Anxiety disorder Mother   . Bipolar disorder Mother   . Learning disabilities Brother   . ADD / ADHD Brother   . Asthma Son   . Hyperlipidemia Brother   . Alcohol abuse Father   . Heart disease Maternal Grandmother        heart attack, pacemaker  . Heart attack Maternal Grandmother   . Hypertension Maternal Grandmother   . Hypertension Maternal Grandfather   . Heart disease Paternal Grandmother        couple of major open heart surgeries, leaking valves  . Hypertension Paternal Grandmother   . Hypertension Paternal Grandfather   . Breast cancer Neg Hx     Social History Social History   Tobacco Use  . Smoking status: Never Smoker  . Smokeless tobacco: Never Used  Vaping Use  . Vaping Use: Never used  Substance Use Topics  . Alcohol use: No    Alcohol/week: 0.0 standard drinks  . Drug use: No    Review of Systems  Constitutional: No fever/chills Eyes: No  visual changes. ENT: No sore throat. Cardiovascular: Denies chest pain. Respiratory: Denies shortness of breath. Gastrointestinal: Positive for abdominal pain, nausea and vomiting.  No diarrhea.  No constipation. Genitourinary: Negative for dysuria. Musculoskeletal: Negative for back pain. Skin: Negative for rash. Neurological: Negative for headaches, focal weakness or numbness.   ____________________________________________   PHYSICAL EXAM:  VITAL SIGNS: ED Triage Vitals [09/01/20 0520]  Enc Vitals Group     BP      Pulse      Resp      Temp      Temp src      SpO2      Weight 233 lb 11 oz (106 kg)     Height '5\' 3"'  (1.6 m)     Head Circumference      Peak Flow      Pain Score 9     Pain Loc      Pain Edu?      Excl. in [place]?     Constitutional: Alert and oriented.  Uncomfortable appearing and in mild to acute distress. Eyes: Conjunctivae are normal. PERRL. EOMI. Head: Atraumatic. Nose: No congestion/rhinnorhea. Mouth/Throat: Mucous membranes are moist.   Neck: No stridor.   Cardiovascular: Normal rate, regular rhythm. Grossly normal heart sounds.  Good peripheral circulation. Respiratory: Normal respiratory effort.  No retractions. Lungs CTAB. Gastrointestinal: Soft and mildly tender to palpation right upper quadrant without rebound or guarding. No distention. No abdominal bruits. No CVA tenderness. Musculoskeletal: No lower extremity tenderness nor edema.  No joint effusions. Neurologic:  Normal speech and language. No gross focal neurologic deficits are appreciated. No gait instability. Skin:  Skin is warm, dry and intact. No rash noted. Psychiatric: Mood and affect are normal. Speech and behavior are normal.  ____________________________________________   LABS (all labs ordered are listed, but only abnormal results are displayed)  Labs Reviewed  CBC WITH DIFFERENTIAL/PLATELET - Abnormal; Notable for the following components:      Result Value   Lymphs Abs  4.3 (*)    All other components within normal limits  COMPREHENSIVE METABOLIC PANEL - Abnormal; Notable for the following components:   CO2 21 (*)    Glucose, Bld 114 (*)  All other components within normal limits  RESP PANEL BY RT-PCR (FLU A&B, COVID) ARPGX2  LIPASE, BLOOD  URINALYSIS, COMPLETE (UACMP) WITH MICROSCOPIC  TROPONIN I (HIGH SENSITIVITY)   ____________________________________________  EKG  ED ECG REPORT I, Malasia Torain J, the attending physician, personally viewed and interpreted this ECG.   Date: 09/01/2020  EKG Time: 0557  Rate: 62  Rhythm: normal EKG, normal sinus rhythm  Axis: Normal  Intervals:none  ST&T Change: Nonspecific  ____________________________________________  RADIOLOGY I, Theona Muhs J, personally viewed and evaluated these images (plain radiographs) as part of my medical decision making, as well as reviewing the written report by the radiologist.  ED MD interpretation: Cholelithiasis without cholecystitis  Official radiology report(s): US ABDOMEN LIMITED RUQ (LIVER/GB)  Result Date: 09/01/2020 CLINICAL DATA:  Right upper quadrant pain, nausea, vomiting EXAM: ULTRASOUND ABDOMEN LIMITED RIGHT UPPER QUADRANT COMPARISON:  CT 01/30/2020 FINDINGS: Gallbladder: Gallbladder contains multiple echogenic, shadowing gallstones. Largest stone measuring up to 9 mm in diameter. No significant gallbladder wall thickening or pericholecystic fluid. Sonographic Percell Miller sign is reportedly negative. Common bile duct: Diameter: 4.9 mm, nondilated Liver: No focal lesion identified. Within normal limits in parenchymal echogenicity. Portal vein is patent on color Doppler imaging with normal direction of blood flow towards the liver. Other: None. IMPRESSION: Cholelithiasis without evidence of acute cholecystitis or significant biliary ductal dilatation. Electronically Signed   By: Lovena Le M.D.   On: 09/01/2020 06:34     ____________________________________________   PROCEDURES  Procedure(s) performed (including Critical Care):  .1-3 Lead EKG Interpretation Performed by: Paulette Blanch, MD Authorized by: Paulette Blanch, MD     Interpretation: normal     ECG rate:  85   ECG rate assessment: normal     Rhythm: sinus rhythm     Ectopy: none     Conduction: normal   Comments:     Patient placed on cardiac monitor to evaluate for arrhythmias     ____________________________________________   INITIAL IMPRESSION / ASSESSMENT AND PLAN / ED COURSE  As part of my medical decision making, I reviewed the following data within the [place] notes reviewed and incorporated, Labs reviewed, EKG interpreted, Old chart reviewed, Radiograph reviewed and Notes from prior ED visits     40 year old female presenting with upper abdominal pain and vomiting. Differential diagnosis includes, but is not limited to, biliary disease (biliary colic, acute cholecystitis, cholangitis, choledocholithiasis, etc), intrathoracic causes for epigastric abdominal pain including ACS, gastritis, duodenitis, pancreatitis, small bowel or large bowel obstruction, abdominal aortic aneurysm, hernia, and ulcer(s).  Will obtain lab work, right upper quadrant abdominal ultrasound to evaluate for cholecystitis.  Initiate IV fluid hydration, IV Dilaudid for pain paired with IV Zofran for nausea/vomiting.  Will also administer IV Pepcid.  Will reassess.   Clinical Course as of 09/01/20 0703  Sun Sep 01, 2020  0651 Patient pain-free. Updated patient and spouse of all test results. Strict return precautions given. Both verbalize understanding and agree with plan of care. [JS]    Clinical Course User Index [JS] Paulette Blanch, MD     ____________________________________________   FINAL CLINICAL IMPRESSION(S) / ED DIAGNOSES  Final diagnoses:  Right upper quadrant abdominal pain  Non-intractable vomiting with  nausea, unspecified vomiting type  Biliary colic     ED Discharge Orders         Ordered    oxyCODONE-acetaminophen (PERCOCET/ROXICET) 5-325 MG tablet  Every 4 hours PRN        09/01/20 1610  ondansetron (ZOFRAN ODT) 4 MG disintegrating tablet  Every 8 hours PRN        09/01/20 0653          *Please note:  Catherine Berg was evaluated in Emergency Department on 09/01/2020 for the symptoms described in the history of present illness. She was evaluated in the context of the global COVID-19 pandemic, which necessitated consideration that the patient might be at risk for infection with the SARS-CoV-2 virus that causes COVID-19. Institutional protocols and algorithms that pertain to the evaluation of patients at risk for COVID-19 are in a state of rapid change based on information released by regulatory bodies including the CDC and federal and state organizations. These policies and algorithms were followed during the patient's care in the ED.  Some ED evaluations and interventions may be delayed as a result of limited staffing during and the pandemic.*   Note:  This document was prepared using Dragon voice recognition software and may include unintentional dictation errors.   Paulette Blanch, MD 09/01/20 [phone]

## 2020-09-01 NOTE — Discharge Instructions (Addendum)
1. Take medicines as needed for pain & nausea (Percocet/Zofran #30). 2. Clear liquids x 12 hours, then bland diet x 1 week, then slowly advance diet as tolerated. Avoid fatty, greasy, spicy foods and drinks. 3. Return to the ER for worsening symptoms, persistent vomiting, fever, difficulty breathing or other concerns.  

## 2020-09-04 ENCOUNTER — Encounter: Payer: Self-pay | Admitting: Internal Medicine

## 2020-09-04 ENCOUNTER — Other Ambulatory Visit: Payer: Self-pay

## 2020-09-04 ENCOUNTER — Telehealth: Payer: Self-pay | Admitting: Internal Medicine

## 2020-09-04 ENCOUNTER — Ambulatory Visit (INDEPENDENT_AMBULATORY_CARE_PROVIDER_SITE_OTHER): Payer: Managed Care, Other (non HMO) | Admitting: Internal Medicine

## 2020-09-04 VITALS — BP 140/80 | HR 73 | Temp 97.7°F | Ht 63.0 in | Wt 230.4 lb

## 2020-09-04 DIAGNOSIS — R918 Other nonspecific abnormal finding of lung field: Secondary | ICD-10-CM

## 2020-09-04 DIAGNOSIS — J4540 Moderate persistent asthma, uncomplicated: Secondary | ICD-10-CM

## 2020-09-04 MED ORDER — ADVAIR HFA 230-21 MCG/ACT IN AERO: 2.0000 | INHALATION_SPRAY | Freq: Two times a day (BID) | RESPIRATORY_TRACT | 12 refills | Status: DC

## 2020-09-04 MED ORDER — BREO ELLIPTA 200-25 MCG/INH IN AEPB: 1.0000 | INHALATION_SPRAY | Freq: Every day | RESPIRATORY_TRACT | 11 refills | Status: DC

## 2020-09-04 MED ORDER — BUDESONIDE-FORMOTEROL FUMARATE 80-4.5 MCG/ACT IN AERO: 2.0000 | INHALATION_SPRAY | Freq: Two times a day (BID) | RESPIRATORY_TRACT | 12 refills | Status: DC

## 2020-09-04 NOTE — Telephone Encounter (Signed)
Patient is aware of below message/recommendaitons and voiced her understanding.  Rx for Breo 200 has been sent to preferred pharmacy.  Nothing further needed.

## 2020-09-04 NOTE — Telephone Encounter (Signed)
Received alternative request from [facility] for Advair.  Preferred medications are anoro, Virgel Bouquet, [place], [place], Flovent HFA and Serevent Diskus.   Dr. Belia Heman, please advise. Thanks

## 2020-09-04 NOTE — Progress Notes (Signed)
[facility] [place] Pulmonary Medicine Consultation     Date: 09/04/2020  MRN# [id number] Catherine Berg Mar 12, 1981   Catherine Berg is a 40 y.o. old female seen in consultation for chief complaint of:   CBC 09/14/18>>122 HST 07/05/18>> AHI of 1.4. **CT chest 03/31/2018>>  there is a small 4 mm or less nodule in the right lower lobe peripherally. Chief Complaint  Patient presents with  . Consult    SOB from covid  in September.     HPI:   Previous history and diagnosis of asthma with obesity Patient diagnosed with COVID-19 pneumonia and infection September 2021 Since then patient has progressive intermittent shortness of breath Progressive cough with intermittent wheezing  Patient is using albuterol 1-2 times per week which has been helping  No ER admissions over the last 6 months  CT imaging reviewed with patient Patient would like repeat CT chest to assess right lung nodule  She does have a dog and allergic to dogs Not in the bedroom Has carpet in her bedroom  No exacerbation at this time No evidence of heart failure at this time No evidence or signs of infection at this time No respiratory distress No fevers, chills, nausea, vomiting, diarrhea No evidence of lower extremity edema No evidence hemoptysis     Social Hx:   Social History   Tobacco Use  . Smoking status: Never Smoker  . Smokeless tobacco: Never Used  Vaping Use  . Vaping Use: Never used  Substance Use Topics  . Alcohol use: No    Alcohol/week: 0.0 standard drinks  . Drug use: No   Medication:    Current Outpatient Medications:  .  albuterol (PROVENTIL HFA;VENTOLIN HFA) 108 (90 Base) MCG/ACT inhaler, Inhale 2 puffs into the lungs every 6 (six) hours as needed for wheezing or shortness of breath., Disp: 18 g, Rfl: 1 .  amphetamine-dextroamphetamine (ADDERALL XR) 15 MG 24 hr capsule, Take 1 capsule by mouth daily., Disp: 30 capsule, Rfl: 0 .  buPROPion (WELLBUTRIN XL) 150 MG 24 hr tablet, Take 1 tablet  (150 mg total) by mouth daily., Disp: 30 tablet, Rfl: 0 .  celecoxib (CELEBREX) 200 MG capsule, Take 1 capsule (200 mg total) by mouth daily after lunch., Disp: 30 capsule, Rfl: 0 .  clobetasol (TEMOVATE) 0.05 % external solution, Apply 1 application topically 2 (two) times daily., Disp: 50 mL, Rfl: 0 .  Cyanocobalamin (B-12) 1000 MCG SUBL, Place 1 tablet under the tongue daily., Disp: 30 each, Rfl: 5 .  [START ON 09/21/2020] DULoxetine (CYMBALTA) 30 MG capsule, 90 mg daily. Take along with 60 mg cap, Disp: 30 capsule, Rfl: 1 .  DULoxetine (CYMBALTA) 60 MG capsule, Take 1 capsule (60 mg total) by mouth in the morning. ily, Disp: 90 capsule, Rfl: 0 .  Elastic Bandages & Supports (MEDICAL COMPRESSION STOCKINGS) MISC, Wear from morning to night, but do not sleep in these, Disp: 1 each, Rfl: 2 .  Etonogestrel (NEXPLANON [place]), Inject into the skin., Disp: , Rfl:  .  famotidine (PEPCID) 20 MG tablet, Take 1 tablet (20 mg total) by mouth daily., Disp: 30 tablet, Rfl: 1 .  fluticasone (FLONASE) 50 MCG/ACT nasal spray, Place 2 sprays into both nostrils daily., Disp: 16 g, Rfl: 2 .  hydrOXYzine (ATARAX/VISTARIL) 10 MG tablet, Take 1 tablet (10 mg total) by mouth 3 (three) times daily as needed., Disp: 30 tablet, Rfl: 0 .  levothyroxine (SYNTHROID) 50 MCG tablet, Take 75 mcg by mouth daily., Disp: , Rfl:  .  montelukast (SINGULAIR) 10 MG tablet, Take 1 tablet (10 mg total) by mouth at bedtime., Disp: 90 tablet, Rfl: 1 .  ondansetron (ZOFRAN ODT) 4 MG disintegrating tablet, Take 1 tablet (4 mg total) by mouth every 8 (eight) hours as needed for nausea or vomiting., Disp: 30 tablet, Rfl: 0 .  oxyCODONE-acetaminophen (PERCOCET/ROXICET) 5-325 MG tablet, Take 1 tablet by mouth every 4 (four) hours as needed for severe pain., Disp: 30 tablet, Rfl: 0 .  pregabalin (LYRICA) 100 MG capsule, Take 1 capsule (100 mg total) by mouth at bedtime as needed., Disp: 90 capsule, Rfl: 0 .  tiZANidine (ZANAFLEX) 4 MG tablet, Take  0.5-1.5 tablets (2-6 mg total) by mouth every 8 (eight) hours as needed for muscle spasms (muscle tightness)., Disp: 90 tablet, Rfl: 2 .  traZODone (DESYREL) 50 MG tablet, Take 0.5-1 tablets (25-50 mg total) by mouth at bedtime as needed for sleep., Disp: 90 tablet, Rfl: 0 .  Vitamin D, Ergocalciferol, (DRISDOL) 1.25 MG (50000 UNIT) CAPS capsule, Take 1 capsule (50,000 Units total) by mouth every 7 (seven) days., Disp: 12 capsule, Rfl: 1   Allergies:  Latex and Shellfish allergy   Review of Systems:  Gen:  Denies  fever, sweats, chills weight loss  HEENT: Denies blurred vision, double vision, ear pain, eye pain, hearing loss, nose bleeds, sore throat Cardiac:  No dizziness, chest pain or heaviness, chest tightness,edema, No JVD Resp:   + cough, -sputum production, -shortness of breath,+wheezing, -hemoptysis,  Gi: Denies swallowing difficulty, stomach pain, nausea or vomiting, diarrhea, constipation, bowel incontinence Gu:  Denies bladder incontinence, burning urine Ext:   Denies Joint pain, stiffness or swelling Skin: Denies  skin rash, easy bruising or bleeding or hives Endoc:  Denies polyuria, polydipsia , polyphagia or weight change Psych:   Denies depression, insomnia or hallucinations  Other:  All other systems negative  BP 140/80 (BP Location: Left Arm, Patient Position: Sitting, Cuff Size: Normal)   Pulse 73   Temp 97.7 F (36.5 C) (Temporal)   Ht 5\' 3"  (1.6 m)   Wt 230 lb 6.4 oz (104.5 kg)   SpO2 99%   BMI 40.81 kg/m    Physical Examination:   General Appearance: No distress  Neuro:without focal findings,  speech normal,  HEENT: PERRLA, EOM intact.   Pulmonary: normal breath sounds, No wheezing.  CardiovascularNormal S1,S2.  No m/r/g.   Abdomen: Benign, Soft, non-tender. Renal:  No costovertebral tenderness  GU:  Not performed at this time. Endoc: No evident thyromegaly Skin:   warm, no rashes, no ecchymosis  Extremities: normal, no cyanosis,  clubbing. PSYCHIATRIC: Mood, affect within normal limits.   ALL OTHER ROS ARE NEGATIVE   Other findings:    LABORATORY PANEL:   CBC Recent Labs  Lab 09/01/20 0529  WBC 7.7  HGB 12.1  HCT 36.5  PLT 298   ------------------------------------------------------------------------------------------------------------------  Chemistries  Recent Labs  Lab 09/01/20 0529  NA 136  K 4.3  CL 108  CO2 21*  GLUCOSE 114*  BUN 14  CREATININE 0.85  CALCIUM 8.9  AST 28  ALT 20  ALKPHOS 60  BILITOT 0.7   ------------------------------------------------------------------------------------------------------------------   Assessment and Plan:  40 year old pleasant African-American female with a history of asthma and obesity with a previous diagnosis of COVID-19 infection pneumonia  Moderate persistent asthma Patient with ongoing cough and intermittent wheezing Patient needs to be started on inhaled corticosteroid and beta agonist therapy We will start Symbicort Currently on albuterol as needed Patient had been previously diagnosed with  COVID-19 infection pneumonia in September 2021   Previous history of lung nodule lung nodule. It has been deemed benign 4 mm nodule in the right lower lobe incidentally found in CT chest in 2018 repeat chest in 2019 did not show any interval changes therefore it was deemed necessary not to follow-up with CT scans but will discuss with patient Repeat CT chest to assess lung nodule      MEDICATION ADJUSTMENTS/LABS AND TESTS ORDERED: Start SYMBICORT - 2 puffs in the morning and 2 puffs at night make sure out rinse mouth after every use  ALBUTEROL AS NEEDED  AVOID POLLEN, SMOKE, DUST, AVOID DOGS as much as you can  Will get CT chest to assess lung nodule  CURRENT MEDICATIONS REVIEWED AT LENGTH WITH PATIENT TODAY   Patient  satisfied with Plan of action and management. All questions answered  Follow up 1 year  TOTAL TIME SPENT  35  mins   Tynesha Free [name], M.D.  [facility]  Medical Director [facility] Medical Director [facility] Cardio-Pulmonary Department

## 2020-09-04 NOTE — Telephone Encounter (Signed)
Received denial for Breo.  I have spoken with Sam at [facility], who confirmed that all three medications require PA although the alternative request stating that these medications were preferred.  Lm for patient to request that she contact insurance for a list of alternatives.

## 2020-09-04 NOTE — Addendum Note (Signed)
Addended by: Lajoyce Lauber A on: 09/04/2020 03:29 PM   Modules accepted: Orders

## 2020-09-04 NOTE — Patient Instructions (Signed)
Start SYMBICORT - 2 puffs in the morning and 2 puffs at night make sure out rinse mouth after every use  ALBUTEROL AS NEEDED  AVOID POLLEN, SMOKE, DUST, AVOID DOGS as much as you can  Will get CT chest to assess lung nodule

## 2020-09-04 NOTE — Telephone Encounter (Signed)
Received PA request from [employer] for Symbicort.  Preferred medications are advair HFA, Breo, Budesonide, Flovent, Trelegy and formoterol Fumarate.  Dr. Belia Heman, please advise. thanks

## 2020-09-04 NOTE — Telephone Encounter (Signed)
Patient is aware of below message and voiced her understanding.  Rx for Advair has been sent to preferred pharmacy.  Nothing further needed.

## 2020-09-05 ENCOUNTER — Ambulatory Visit: Payer: Self-pay | Admitting: General Surgery

## 2020-09-05 NOTE — Telephone Encounter (Signed)
Spoke to patient, who stated that she contacted the pharmacy and was advised that PA had been started.  Pharmacy recommended that patient call back at 9am for update.  Patient will contact our office will update.

## 2020-09-05 NOTE — H&P (View-Only) (Signed)
PATIENT PROFILE: Catherine Berg is a 40 y.o. female who presents to the Clinic for consultation at the request of Dr. Beather Berg for evaluation of cholelithiasis.  PCP:  Catherine Chance, MD  HISTORY OF PRESENT ILLNESS: Catherine Berg reports had an episode of severe abdominal pain 4 days ago.  She reported the pain was localized in the upper abdomen.  The pain does not radiate to the proper the body.  Pain was aggravated by oral intake.  Alleviating factor was pain medication in the emergency room.  She denies any fever or chills.  At the ED she had an ultrasound of the abdomen done.  This showed cholelithiasis.  There was no sign of cholecystitis.  She also had a ultrasound done 10 years ago with finding of cholelithiasis.  She has never been recommended to have her gallbladder removed.   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  Positive for abdominal pain Musculoskeletal ROS: negative for - joint swelling or muscle pain Neurological ROS: negative for - confusion, syncope Dermatological  ROS: negative for pruritus and rash Psychiatric: negative for anxiety, depression, difficulty sleeping and memory loss  MEDICATIONS: Current Medications        Current Outpatient Medications  Medication Sig Dispense Refill  . albuterol 90 mcg/actuation inhaler Inhale 2 inhalations into the lungs every 6 (six) hours as needed    . celecoxib (CELEBREX) 200 MG capsule Take 200 mg by mouth once daily    . clobetasoL (CORMAX) 0.05 % external solution Apply 1 Application topically 2 (two) times daily    . cyanocobalamin (VITAMIN B12) 1000 MCG tablet Take 1,000 mcg by mouth once daily.    Marland Kitchen dextroamphetamine-amphetamine (ADDERALL XR) 15 MG XR capsule Take 1 capsule by mouth once daily    . DULoxetine (CYMBALTA) 30 MG DR capsule Take 30 mg by mouth once daily Take with 60 mg capsule    . DULoxetine (CYMBALTA) 60 MG DR capsule Take 60 mg by mouth once daily Take with 30 mg capsule for 90 mg total.    . ergocalciferol, vitamin D2, 1,250 mcg (50,000 unit) capsule Take 1 capsule by mouth once a week    . etonogestreL (NEXPLANON) 68 mg implant Inject subcutaneously    . famotidine (PEPCID) 20 MG tablet Take 20 mg by mouth once daily    . fluticasone propionate (FLONASE) 50 mcg/actuation nasal spray 2 sprays by Nasal route once daily    . hydrOXYzine HCL (ATARAX) 10 MG tablet Take 10  mg by mouth 3 (three) times daily as needed    . levothyroxine (SYNTHROID) 75 MCG tablet Take 1 tablet (75 mcg total) by mouth once daily Take on an empty stomach with a glass of water at least 30-60 minutes before breakfast. 90 tablet 4  . montelukast (SINGULAIR) 10 mg tablet Take 10 mg by mouth once daily    . pregabalin (LYRICA) 100 MG capsule Take 100 mg by mouth nightly as needed    . tiZANidine (ZANAFLEX) 4 MG tablet Take 2-6 mg by mouth every 8 (eight) hours as needed    . traZODone (DESYREL) 50 MG tablet Take 25-50 mg by mouth nightly as needed    . UNABLE TO FIND Med Name: Medical  compression stockings    . hydrOXYzine (ATARAX) 25 MG tablet Take 25 mg by mouth 3 (three) times daily as needed (Patient not taking: No sig reported)    . pregabalin (LYRICA) 75 MG capsule Take 75 mg by mouth 2 (two) times daily (Patient not taking: No sig reported)     No current facility-administered medications for this visit.      ALLERGIES: Latex and Shellfish containing products  PAST MEDICAL HISTORY:     Past Medical History:  Diagnosis Date  . Anosmia   . Anxiety   . Asthma without status asthmaticus, unspecified   . 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   . Chest pain   . Edema   . Family history of coronary artery disease   . Gallstones   . GERD (gastroesophageal reflux disease)   . Iron deficiency anemia   . Postablative hypothyroidism   . Thyroid nodule     PAST SURGICAL HISTORY:      Past Surgical History:  Procedure Laterality Date  . cervical polypectomy    . CESAREAN DELIVERY  2005  . CESAREAN SECTION  2008  . colonoscopy    . EGD    . givens capsule study       FAMILY HISTORY:      Family History  Problem Relation Age of Onset  . Myocardial Infarction (Heart attack) Mother   . Cancer Mother        laryngeal cancer  . Heart failure Mother   . COPD Mother   . High blood pressure (Hypertension) Mother   . Asthma Mother   . Heart disease Mother   . Mental illness Mother   . Alcohol abuse Mother   . Drug abuse Mother   . Depression Mother   . Bipolar disorder Mother   . Anxiety Mother   . Rheum arthritis Mother   . Alcohol abuse Father   . Hyperlipidemia (Elevated cholesterol) Brother   . ADD / ADHD Brother   . Thyroid disease Paternal Aunt   . Myocardial Infarction (Heart attack) Maternal Grandmother   . Heart disease Maternal Grandmother   . Diabetes Maternal Grandmother   . Ovarian cancer Maternal Grandmother   . High blood pressure (Hypertension) Maternal Grandmother   . High blood  pressure (Hypertension) Maternal Grandfather   . Heart disease Paternal Grandmother   . High blood pressure (Hypertension) Paternal Grandmother   . Colon cancer Paternal Grandfather   . High blood pressure (Hypertension) Paternal Grandfather   . Asthma Child      SOCIAL HISTORY: Social History         Socioeconomic History  . Marital status: Single  Tobacco Use  . Smoking status: Never Smoker  . Smokeless tobacco: Never Used  Vaping Use  .  Vaping Use: Never used  Substance and Sexual Activity  . Alcohol use: No  . Drug use: No  . Sexual activity: Defer      PHYSICAL EXAM:    Vitals:   09/05/20 1133  BP: (!) 161/92  Pulse: 69   Body mass index is 41.45 kg/m. Weight: (!) 106.1 kg (234 lb)   GENERAL: Alert, active, oriented x3  HEENT: Pupils equal reactive to light. Extraocular movements are intact. Sclera clear. Palpebral conjunctiva normal red color.Pharynx clear.  NECK: Supple with no palpable mass and no adenopathy.  LUNGS: Sound clear with no rales rhonchi or wheezes.  HEART: Regular rhythm S1 and S2 without murmur.  ABDOMEN: Soft and depressible, nontender with no palpable mass, no hepatomegaly.   EXTREMITIES: Well-developed well-nourished symmetrical with no dependent edema.  NEUROLOGICAL: Awake alert oriented, facial expression symmetrical, moving all extremities.  REVIEW OF DATA: I have reviewed the following data today:      Initial consult on 08/29/2020  Component Date Value  . CK, Total (Creatine Kina* 08/29/2020 278 (!)  . HLA B27 - [facility] 08/29/2020 Negative   Appointment on 07/11/2020  Component Date Value  . Thyroid Stimulating Horm* 07/11/2020 3.192      ASSESSMENT: Ms. Espe is a 40 y.o. female presenting for consultation for cholelithiasis.    Patient was oriented about the diagnosis of cholelithiasis. Also oriented about what is the gallbladder, its anatomy and function and the implications of having  stones. The patient was oriented about the treatment alternatives (observation vs cholecystectomy). Patient was oriented that a low percentage of patient will continue to have similar pain symptoms even after the gallbladder is removed. Surgical technique (open vs laparoscopic) was discussed. It was also discussed the goals of the surgery (decrease the pain episodes and avoid the risk of cholecystitis) and the risk of surgery including: bleeding, infection, common bile duct injury, stone retention, injury to other organs such as bowel, liver, stomach, other complications such as hernia, bowel obstruction among others. Also discussed with patient about anesthesia and its complications such as: reaction to medications, pneumonia, heart complications, death, among others.  Cholelithiasis without cholecystitis [K80.20]  PLAN: 1. Robotic assisted laparoscopic cholecystectomy (45913) 2.  CBC, CMP (done) 3.  Pulmonology clearance 4.  Do not take aspirin 5 days before the procedure 5.  Contact us if has any question or concern.   Patient verbalized understanding, all questions were answered, and were agreeable with the plan outlined above.     Herbert Pun, MD  Electronically signed by Herbert Pun, MD

## 2020-09-05 NOTE — H&P (Signed)
PATIENT PROFILE: Catherine Berg is a 40 y.o. female who presents to the Clinic for consultation at the request of Dr. Sung for evaluation of cholelithiasis.  PCP:  Sowles, Krichna F, MD  HISTORY OF PRESENT ILLNESS: Catherine Berg reports had an episode of severe abdominal pain 4 days ago.  She reported the pain was localized in the upper abdomen.  The pain does not radiate to the proper the body.  Pain was aggravated by oral intake.  Alleviating factor was pain medication in the emergency room.  She denies any fever or chills.  At the ED she had an ultrasound of the abdomen done.  This showed cholelithiasis.  There was no sign of cholecystitis.  She also had a ultrasound done 10 years ago with finding of cholelithiasis.  She has never been recommended to have her gallbladder removed.   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  Positive for abdominal pain Musculoskeletal ROS: negative for - joint swelling or muscle pain Neurological ROS: negative for - confusion, syncope Dermatological  ROS: negative for pruritus and rash Psychiatric: negative for anxiety, depression, difficulty sleeping and memory loss  MEDICATIONS: Current Medications        Current Outpatient Medications  Medication Sig Dispense Refill  . albuterol 90 mcg/actuation inhaler Inhale 2 inhalations into the lungs every 6 (six) hours as needed    . celecoxib (CELEBREX) 200 MG capsule Take 200 mg by mouth once daily    . clobetasoL (CORMAX) 0.05 % external solution Apply 1 Application topically 2 (two) times daily    . cyanocobalamin (VITAMIN B12) 1000 MCG tablet Take 1,000 mcg by mouth once daily.    . dextroamphetamine-amphetamine (ADDERALL XR) 15 MG XR capsule Take 1 capsule by mouth once daily    . DULoxetine (CYMBALTA) 30 MG DR capsule Take 30 mg by mouth once daily Take with 60 mg capsule    . DULoxetine (CYMBALTA) 60 MG DR capsule Take 60 mg by mouth once daily Take with 30 mg capsule for 90 mg total.    . ergocalciferol, vitamin D2, 1,250 mcg (50,000 unit) capsule Take 1 capsule by mouth once a week    . etonogestreL (NEXPLANON) 68 mg implant Inject subcutaneously    . famotidine (PEPCID) 20 MG tablet Take 20 mg by mouth once daily    . fluticasone propionate (FLONASE) 50 mcg/actuation nasal spray 2 sprays by Nasal route once daily    . hydrOXYzine HCL (ATARAX) 10 MG tablet Take 10   mg by mouth 3 (three) times daily as needed    . levothyroxine (SYNTHROID) 75 MCG tablet Take 1 tablet (75 mcg total) by mouth once daily Take on an empty stomach with a glass of water at least 30-60 minutes before breakfast. 90 tablet 4  . montelukast (SINGULAIR) 10 mg tablet Take 10 mg by mouth once daily    . pregabalin (LYRICA) 100 MG capsule Take 100 mg by mouth nightly as needed    . tiZANidine (ZANAFLEX) 4 MG tablet Take 2-6 mg by mouth every 8 (eight) hours as needed    . traZODone (DESYREL) 50 MG tablet Take 25-50 mg by mouth nightly as needed    . UNABLE TO FIND Med Name: Medical  compression stockings    . hydrOXYzine (ATARAX) 25 MG tablet Take 25 mg by mouth 3 (three) times daily as needed (Patient not taking: No sig reported)    . pregabalin (LYRICA) 75 MG capsule Take 75 mg by mouth 2 (two) times daily (Patient not taking: No sig reported)     No current facility-administered medications for this visit.      ALLERGIES: Latex and Shellfish containing products  PAST MEDICAL HISTORY:     Past Medical History:  Diagnosis Date  . Anosmia   . Anxiety   . Asthma without status asthmaticus, unspecified   . 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   . Chest pain   . Edema   . Family history of coronary artery disease   . Gallstones   . GERD (gastroesophageal reflux disease)   . Iron deficiency anemia   . Postablative hypothyroidism   . Thyroid nodule     PAST SURGICAL HISTORY:      Past Surgical History:  Procedure Laterality Date  . cervical polypectomy    . CESAREAN DELIVERY  2005  . CESAREAN SECTION  2008  . colonoscopy    . EGD    . givens capsule study       FAMILY HISTORY:      Family History  Problem Relation Age of Onset  . Myocardial Infarction (Heart attack) Mother   . Cancer Mother        laryngeal cancer  . Heart failure Mother   . COPD Mother   . High blood pressure (Hypertension) Mother   . Asthma Mother   . Heart disease Mother   . Mental illness Mother   . Alcohol abuse Mother   . Drug abuse Mother   . Depression Mother   . Bipolar disorder Mother   . Anxiety Mother   . Rheum arthritis Mother   . Alcohol abuse Father   . Hyperlipidemia (Elevated cholesterol) Brother   . ADD / ADHD Brother   . Thyroid disease Paternal Aunt   . Myocardial Infarction (Heart attack) Maternal Grandmother   . Heart disease Maternal Grandmother   . Diabetes Maternal Grandmother   . Ovarian cancer Maternal Grandmother   . High blood pressure (Hypertension) Maternal Grandmother   . High blood  pressure (Hypertension) Maternal Grandfather   . Heart disease Paternal Grandmother   . High blood pressure (Hypertension) Paternal Grandmother   . Colon cancer Paternal Grandfather   . High blood pressure (Hypertension) Paternal Grandfather   . Asthma Child      SOCIAL HISTORY: Social History         Socioeconomic History  . Marital status: Single  Tobacco Use  . Smoking status: Never Smoker  . Smokeless tobacco: Never Used  Vaping Use  .   Vaping Use: Never used  Substance and Sexual Activity  . Alcohol use: No  . Drug use: No  . Sexual activity: Defer      PHYSICAL EXAM:    Vitals:   09/05/20 1133  BP: (!) 161/92  Pulse: 69   Body mass index is 41.45 kg/m. Weight: (!) 106.1 kg (234 lb)   GENERAL: Alert, active, oriented x3  HEENT: Pupils equal reactive to light. Extraocular movements are intact. Sclera clear. Palpebral conjunctiva normal red color.Pharynx clear.  NECK: Supple with no palpable mass and no adenopathy.  LUNGS: Sound clear with no rales rhonchi or wheezes.  HEART: Regular rhythm S1 and S2 without murmur.  ABDOMEN: Soft and depressible, nontender with no palpable mass, no hepatomegaly.   EXTREMITIES: Well-developed well-nourished symmetrical with no dependent edema.  NEUROLOGICAL: Awake alert oriented, facial expression symmetrical, moving all extremities.  REVIEW OF DATA: I have reviewed the following data today:      Initial consult on 08/29/2020  Component Date Value  . CK, Total (Creatine Kina* 08/29/2020 278 (!)  . HLA B27 - [facility] 08/29/2020 Negative   Appointment on 07/11/2020  Component Date Value  . Thyroid Stimulating Horm* 07/11/2020 3.192      ASSESSMENT: Catherine Berg is a 40 y.o. female presenting for consultation for cholelithiasis.    Patient was oriented about the diagnosis of cholelithiasis. Also oriented about what is the gallbladder, its anatomy and function and the implications of having  stones. The patient was oriented about the treatment alternatives (observation vs cholecystectomy). Patient was oriented that a low percentage of patient will continue to have similar pain symptoms even after the gallbladder is removed. Surgical technique (open vs laparoscopic) was discussed. It was also discussed the goals of the surgery (decrease the pain episodes and avoid the risk of cholecystitis) and the risk of surgery including: bleeding, infection, common bile duct injury, stone retention, injury to other organs such as bowel, liver, stomach, other complications such as hernia, bowel obstruction among others. Also discussed with patient about anesthesia and its complications such as: reaction to medications, pneumonia, heart complications, death, among others.  Cholelithiasis without cholecystitis [K80.20]  PLAN: 1. Robotic assisted laparoscopic cholecystectomy (47562) 2.  CBC, CMP (done) 3.  Pulmonology clearance 4.  Do not take aspirin 5 days before the procedure 5.  Contact us if has any question or concern.   Patient verbalized understanding, all questions were answered, and were agreeable with the plan outlined above.     Sequan Auxier Cintron-Diaz, MD  Electronically signed by Terilynn Buresh Cintron-Diaz, MD   

## 2020-09-06 ENCOUNTER — Encounter: Payer: Self-pay | Admitting: Family Medicine

## 2020-09-06 ENCOUNTER — Telehealth: Payer: Self-pay | Admitting: Internal Medicine

## 2020-09-06 NOTE — Telephone Encounter (Signed)
Lm for patient.  

## 2020-09-09 MED ORDER — DULERA 200-5 MCG/ACT IN AERO: 2.0000 | INHALATION_SPRAY | Freq: Two times a day (BID) | RESPIRATORY_TRACT | 11 refills | Status: DC

## 2020-09-09 NOTE — Telephone Encounter (Signed)
Rx for Dulera 200 has been sent to preferred pharmacy.  Patient is aware and voiced her understanding.  Nothing further needed at this time.

## 2020-09-09 NOTE — Telephone Encounter (Signed)
Spoke to patient, who stated that Elwin Sleight is covered by insurance.   Dr. Belia Heman, please advise. Thanks

## 2020-09-13 ENCOUNTER — Other Ambulatory Visit: Payer: Self-pay

## 2020-09-13 ENCOUNTER — Other Ambulatory Visit
Admission: RE | Admit: 2020-09-13 | Discharge: 2020-09-13 | Disposition: A | Discharge status: Home / Self Care | Payer: Managed Care, Other (non HMO) | Source: Ambulatory Visit | Attending: General Surgery | Admitting: General Surgery

## 2020-09-13 HISTORY — DX: Other specified postprocedural states: Z98.890

## 2020-09-13 HISTORY — DX: Hypothyroidism, unspecified: E03.9

## 2020-09-13 HISTORY — DX: Depression, unspecified: F32.A

## 2020-09-13 HISTORY — DX: Other specified postprocedural states: R11.2

## 2020-09-13 NOTE — Patient Instructions (Addendum)
Your procedure is scheduled on: 09/18/20 - Wednesday Report to the Registration Desk on the 1st floor of the [facility]. To find out your arrival time, please call [phone] between 1PM - 3PM on: 09/17/20 - Tuesday  REMEMBER: Instructions that are not followed completely may result in serious medical risk, up to and including death; or upon the discretion of your surgeon and anesthesiologist your surgery may need to be rescheduled.  Do not eat food after midnight the night before surgery.  No gum chewing, lozengers or hard candies.  You may however, drink CLEAR liquids up to 2 hours before you are scheduled to arrive for your surgery. Do not drink anything within 2 hours of your scheduled arrival time.  Clear liquids include: - water  - apple juice without pulp - gatorade (not RED, PURPLE, OR BLUE) - black coffee or tea (Do NOT add milk or creamers to the coffee or tea) Do NOT drink anything that is not on this list.  TAKE THESE MEDICATIONS THE MORNING OF SURGERY WITH A SIP OF WATER:  - buPROPion (WELLBUTRIN XL) 150 MG 24 hr tablet - DULoxetine (CYMBALTA) 30 MG capsule -  DULoxetine (CYMBALTA) 60 MG capsule - levothyroxine (SYNTHROID) 75 MCG tablet - mometasone-formoterol (DULERA) 200-5 MCG/ACT A  Use albuterol (PROVENTIL HFA;VENTOLIN HFA) 108 (90 Base) MCG/ACT inhaler on the day of surgery and bring to the hospital.  One week prior to surgery: Stop Anti-inflammatories (NSAIDS) such as Advil, Aleve, Ibuprofen, Motrin, Naproxen, Naprosyn and Aspirin based products such as Excedrin, Goodys Powder, BC Powder.  Stop ANY OVER THE COUNTER supplements until after surgery.  No Alcohol for 24 hours before or after surgery.  No Smoking including e-cigarettes for 24 hours prior to surgery.  No chewable tobacco products for at least 6 hours prior to surgery.  No nicotine patches on the day of surgery.  Do not use any "recreational" drugs for at least a week prior to your surgery.   Please be advised that the combination of cocaine and anesthesia may have negative outcomes, up to and including death. If you test positive for cocaine, your surgery will be cancelled.  On the morning of surgery brush your teeth with toothpaste and water, you may rinse your mouth with mouthwash if you wish. Do not swallow any toothpaste or mouthwash.  Do not wear jewelry, make-up, hairpins, clips or nail polish.  Do not wear lotions, powders, or perfumes.   Do not shave body from the neck down 48 hours prior to surgery just in case you cut yourself which could leave a site for infection.  Also, freshly shaved skin may become irritated if using the CHG soap.  Contact lenses, hearing aids and dentures may not be worn into surgery.  Do not bring valuables to the hospital. [facility] is not responsible for any missing/lost belongings or valuables.   Notify your doctor if there is any change in your medical condition (cold, fever, infection).  Wear comfortable clothing (specific to your surgery type) to the hospital.  Plan for stool softeners for home use; pain medications have a tendency to cause constipation. You can also help prevent constipation by eating foods high in fiber such as fruits and vegetables and drinking plenty of fluids as your diet allows.  After surgery, you can help prevent lung complications by doing breathing exercises.  Take deep breaths and cough every 1-2 hours. Your doctor may order a device called an Incentive Spirometer to help you take deep breaths. When  coughing or sneezing, hold a pillow firmly against your incision with both hands. This is called "splinting." Doing this helps protect your incision. It also decreases belly discomfort.  If you are being admitted to the hospital overnight, leave your suitcase in the car. After surgery it may be brought to your room.  If you are being discharged the day of surgery, you will not be allowed to drive home. You  will need a responsible adult (18 years or older) to drive you home and stay with you that night.   If you are taking public transportation, you will need to have a responsible adult (18 years or older) with you. Please confirm with your physician that it is acceptable to use public transportation.   Please call the [place] [facility] at [phone] if you have any questions about these instructions.  Surgery Visitation Policy:  Patients undergoing a surgery or procedure may have one family member or support person with them as long as that person is not COVID-19 positive or experiencing its symptoms.  That person may remain in the waiting area during the procedure.  Inpatient Visitation:    Visiting hours are 7 a.m. to 8 p.m. Inpatients will be allowed two visitors daily. The visitors may change each day during the patient's stay. No visitors under the age of 51. Any visitor under the age of 88 must be accompanied by an adult. The visitor must pass COVID-19 screenings, use hand sanitizer when entering and exiting the patient's room and wear a mask at all times, including in the patient's room. Patients must also wear a mask when staff or their visitor are in the room. Masking is required regardless of vaccination status.

## 2020-09-13 NOTE — Progress Notes (Signed)
  Perioperative Services Pre-Admission/Anesthesia Testing     Date: 09/13/20  Name: Catherine Berg MRN:   [id number]  Re: Request from surgery for clearance prior to scheduled procedure  Patient is scheduled to undergo a ROBOTIC ASSISTED LAPAROSCOPIC CHOLECYSTECTOMY on 09/18/2020 with Dr. Carolan Shiver.  Note from surgeon indicates need for presurgical clearance from pulmonary medicine, however there are no notes to suggest that office has requested at this point.  Clearance documents generated and faxed to appropriate provider(s) as noted below. Note will be updated to reflect communication with provider's office as it relates to clearance being provided and/or the need for office visit prior to clearance for surgery being issued.   PROVIDER SPECIALTY FAXED TO FAXED DATE  Erin Fulling, MD  Pulmonary Medicine  [phone] 09/13/20   Quentin Mulling, MSN, APRN, FNP-C, CEN [facility]  Peri-operative Services Nurse Practitioner Phone: [phone] 09/13/20 12:33 PM

## 2020-09-16 ENCOUNTER — Telehealth: Payer: Self-pay | Admitting: Urgent Care

## 2020-09-16 NOTE — Progress Notes (Signed)
  Perioperative Services Pre-Admission/Anesthesia Testing     Date: 09/16/20  Name: Catherine Berg MRN:   [id number]  Re: Surgical clearance  Surgical clearance received from Dr. Clovis Fredrickson office (pulmonary medicine). Patient has been cleared for the planned cholecystectomy procedure scheduled for 09/18/2020 with Dr. Hazle Quant.  Pulmonologist notes that patient may proceed with an overall MODERATE risk stratification. Copy of signed clearance form placed on patient's OR chart for review by the surgical/anesthetic team on the day of his procedure.   Quentin Mulling, MSN, APRN, FNP-C, CEN [facility]  Peri-operative Services Nurse Practitioner Phone: [phone] 09/16/20 1:36 PM

## 2020-09-17 ENCOUNTER — Other Ambulatory Visit: Payer: Self-pay

## 2020-09-17 ENCOUNTER — Ambulatory Visit (INDEPENDENT_AMBULATORY_CARE_PROVIDER_SITE_OTHER): Payer: Managed Care, Other (non HMO) | Admitting: Otolaryngology

## 2020-09-17 ENCOUNTER — Telehealth: Payer: Self-pay | Admitting: Internal Medicine

## 2020-09-17 ENCOUNTER — Encounter (INDEPENDENT_AMBULATORY_CARE_PROVIDER_SITE_OTHER): Payer: Self-pay | Admitting: Otolaryngology

## 2020-09-17 ENCOUNTER — Other Ambulatory Visit (INDEPENDENT_AMBULATORY_CARE_PROVIDER_SITE_OTHER): Payer: Self-pay

## 2020-09-17 VITALS — Temp 97.3°F

## 2020-09-17 DIAGNOSIS — J382 Nodules of vocal cords: Secondary | ICD-10-CM | POA: Diagnosis not present

## 2020-09-17 DIAGNOSIS — R490 Dysphonia: Secondary | ICD-10-CM

## 2020-09-17 NOTE — Telephone Encounter (Signed)
Lm for patient.  

## 2020-09-17 NOTE — Progress Notes (Signed)
HPI: Catherine Berg is a 40 y.o. female who presents is referred by her PCP for evaluation of hoarseness as she has had for about 6 months.  She has had no sore throat and no swallowing problems.  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  Her hoarseness is sometimes worse than other times.  She has mild hoarseness today in the office..  Past Medical History:  Diagnosis Date  . Abnormal thyroid blood test   . Anemia   . Anxiety   . Asthma   . Complication of anesthesia    itching after c sections  . COVID-19 01/2020  . Depression   . 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 Graves disease   . History of abnormal cervical Pap smear   . History of cervical polypectomy   . Hives 09/02/2015  . Hypertension   . Hypothyroidism   . IFG (impaired fasting glucose)   . Incidental lung nodule, > 39mm and < 41mm 03/31/2017   4 mm RLL lung nodule on chest CT Mar 31, 2017  . Low serum vitamin D   . Obesity   . PONV (postoperative nausea and vomiting)    after c section had vomit  . Pregnancy induced hypertension    with 1st pregnancy, normal pressure with 2nd  . Reflux   . Thyroid disease   . Vitamin B12 deficiency   . Vitamin D deficiency disease   . Wears dentures    full upper   Past Surgical History:  Procedure Laterality Date  . CERVICAL POLYPECTOMY    . CESAREAN SECTION     x 2  . COLONOSCOPY WITH PROPOFOL N/A 11/14/2018   Procedure: COLONOSCOPY WITH PROPOFOL;  Surgeon: Pasty Spillers, MD;  Location[facility];  Service: Endoscopy;  Laterality: N/A;  . ESOPHAGOGASTRODUODENOSCOPY (EGD) WITH PROPOFOL N/A 11/14/2018   Procedure: ESOPHAGOGASTRODUODENOSCOPY (EGD) WITH PROPOFOL;  Surgeon: Pasty Spillers, MD;  Location: [facility];  Service: Endoscopy;  Laterality: N/A;  Latex allergy  . GIVENS CAPSULE STUDY N/A 12/27/2018   Procedure: GIVENS CAPSULE STUDY;  Surgeon:  Pasty Spillers, MD;  Location: [facility];  Service: Endoscopy;  Laterality: N/A;   Social History   Socioeconomic History  . Marital status: Single    Spouse name: Not on file  . Number of children: 2  . Years of education: 32  . Highest education level: High school graduate  Occupational History  . Not on file  Tobacco Use  . Smoking status: Never Smoker  . Smokeless tobacco: Never Used  Vaping Use  . Vaping Use: Never used  Substance and Sexual Activity  . Alcohol use: No    Alcohol/week: 0.0 standard drinks  . Drug use: No  . Sexual activity: Yes    Partners: Male    Birth control/protection: None  Other Topics Concern  . Not on file  Social History Narrative  . Not on file   Social Determinants of Health   Financial Resource Strain: Not on file  Food Insecurity: Not on file  Transportation Needs: Not on file  Physical Activity: Not on file  Stress: Not on file  Social Connections: Not on file   Family History  Problem Relation Age of Onset  . Heart attack Mother 73  . Hypertension Mother   . Asthma Mother   . Cancer Mother  laryngeal  . Diabetes Mother        pre diabetic  . Heart disease Mother   . Mental illness Mother   . Alcohol abuse Mother   . Drug abuse Mother   . Depression Mother   . Anxiety disorder Mother   . Bipolar disorder Mother   . Learning disabilities Brother   . ADD / ADHD Brother   . Asthma Son   . Hyperlipidemia Brother   . Alcohol abuse Father   . Heart disease Maternal Grandmother        heart attack, pacemaker  . Heart attack Maternal Grandmother   . Hypertension Maternal Grandmother   . Hypertension Maternal Grandfather   . Heart disease Paternal Grandmother        couple of major open heart surgeries, leaking valves  . Hypertension Paternal Grandmother   . Hypertension Paternal Grandfather   . Breast cancer Neg Hx    Allergies  Allergen Reactions  . Latex Hives    (Balloons, condoms, underwear  elastic, gloves)  . Shellfish Allergy Hives   Prior to Admission medications   Medication Sig Start Date End Date Taking? Authorizing Provider  albuterol (PROVENTIL HFA;VENTOLIN HFA) 108 (90 Base) MCG/ACT inhaler Inhale 2 puffs into the lungs every 6 (six) hours as needed for wheezing or shortness of breath. 06/28/18   Shane Crutch, MD  amphetamine-dextroamphetamine (ADDERALL XR) 15 MG 24 hr capsule Take 1 capsule by mouth daily. Patient not taking: No sig reported 06/10/20   [name], MD  buPROPion (WELLBUTRIN XL) 150 MG 24 hr tablet Take 1 tablet (150 mg total) by mouth daily. 08/28/20 09/27/20  [name], MD  celecoxib (CELEBREX) 200 MG capsule Take 1 capsule (200 mg total) by mouth daily after lunch. 07/17/20   [name], MD  clobetasol (TEMOVATE) 0.05 % external solution Apply 1 application topically 2 (two) times daily. Patient not taking: No sig reported 10/31/19   [name], MD  Cyanocobalamin (B-12) 1000 MCG SUBL Place 1 tablet under the tongue daily. Patient not taking: No sig reported 10/05/18   [name], MD  DULoxetine (CYMBALTA) 30 MG capsule 90 mg daily. Take along with 60 mg cap Patient taking differently: Take 30 mg by mouth daily. 09/21/20   [name], MD  DULoxetine (CYMBALTA) 60 MG capsule Take 1 capsule (60 mg total) by mouth in the morning. ily Patient taking differently: Take 60 mg by mouth in the morning. 07/17/20   [name], MD  Elastic Bandages & Supports (MEDICAL COMPRESSION STOCKINGS) MISC Wear from morning to night, but do not sleep in these 04/20/17   Lada, Janit Bern, MD  etonogestrel (NEXPLANON) 68 MG IMPL implant Inject 68 mg into the skin once.    [provider]  famotidine (PEPCID) 20 MG tablet Take 1 tablet (20 mg total) by mouth daily. Patient not taking: No sig reported 04/20/19   Pasty Spillers, MD  fluticasone (FLONASE) 50 MCG/ACT nasal spray Place 2 sprays into both nostrils daily. Patient not taking:  No sig reported 01/05/19   [name], MD  hydrOXYzine (ATARAX/VISTARIL) 10 MG tablet Take 1 tablet (10 mg total) by mouth 3 (three) times daily as needed. 06/27/20   Ivonne Andrew, NP  levothyroxine (SYNTHROID) 75 MCG tablet Take 75 mcg by mouth daily before breakfast. 11/13/18   Sherlon Handing, MD  mometasone-formoterol (DULERA) 200-5 MCG/ACT AERO Inhale 2 puffs into the lungs in the morning and at bedtime. 09/09/20   Erin Fulling, MD  montelukast (SINGULAIR) 10 MG tablet Take 1 tablet (10 mg total) by mouth at bedtime. 06/10/20   [name], MD  ondansetron (ZOFRAN ODT) 4 MG disintegrating tablet Take 1 tablet (4 mg total) by mouth every 8 (eight) hours as needed for nausea or vomiting. 09/01/20   Irean Hong, MD  oxyCODONE-acetaminophen (PERCOCET/ROXICET) 5-325 MG tablet Take 1 tablet by mouth every 4 (four) hours as needed for severe pain. 09/01/20   Irean Hong, MD  pregabalin (LYRICA) 100 MG capsule Take 1 capsule (100 mg total) by mouth at bedtime as needed. 07/17/20   [name], MD  tiZANidine (ZANAFLEX) 4 MG tablet Take 0.5-1.5 tablets (2-6 mg total) by mouth every 8 (eight) hours as needed for muscle spasms (muscle tightness). 09/12/19   Danelle Berry, PA-C  traZODone (DESYREL) 50 MG tablet Take 0.5-1 tablets (25-50 mg total) by mouth at bedtime as needed for sleep. 07/17/20   [name], MD  Vitamin D, Ergocalciferol, (DRISDOL) 1.25 MG (50000 UNIT) CAPS capsule Take 1 capsule (50,000 Units total) by mouth every 7 (seven) days. 07/17/20   [name], MD     Positive ROS: Otherwise negative  All other systems have been reviewed and were otherwise negative with the exception of those mentioned in the HPI and as above.  Physical Exam: Constitutional: Alert, well-appearing, no acute distress.  She has a mild raspy voice in the office today. Ears: External ears without lesions or tenderness. Ear canals are clear bilaterally with intact, clear TMs.  Nasal: External nose  without lesions. Septum with mild deformity to the left. Clear nasal passages otherwise. Oral: Lips and gums without lesions. Tongue and palate mucosa without lesions. Posterior oropharynx clear.  Tonsil regions appear benign bilaterally.  Indirect laryngoscopy revealed a clear base of tongue flap and epiglottis.  Vocal cords with small anterior vocal cord nodules with normal mobility. Fiberoptic laryngoscopy was performed to the right nostril.  The nasopharynx was clear.  The base of tongue vallecula epiglottis were normal.  On evaluation the vocal cords she has small anterior vocal cord nodules bilaterally which were symmetric.  Both cords had normal mobility. Neck: No palpable adenopathy or masses Respiratory: Breathing comfortably  Skin: No facial/neck lesions or rash noted.  Laryngoscopy  Date/Time: 09/17/2020 2:20 PM Performed by: Drema Halon, MD Authorized by: Drema Halon, MD   Consent:    Consent obtained:  Verbal   Consent given by:  Patient Procedure details:    Indications: hoarseness, dysphagia, or aspiration     Medication:  Afrin   Instrument: flexible fiberoptic laryngoscope     Scope location: right nare   Mouth:    Oropharynx: normal     Vallecula: normal     Epiglottis: normal   Throat:    True vocal cords: normal   Comments:     On fiberoptic laryngoscopy patient has typical anterior vocal cord nodules bilaterally at anterior one third of the vocal cords.  Both cords had normal mobility bilaterally.    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  Plan: Reviewed vocal cord nodules with the patient in the office today. I will refer patient to speech therapy for treatment of vocal cord nodules.   Narda Bonds, MD   CC:

## 2020-09-18 ENCOUNTER — Ambulatory Visit: Payer: Managed Care, Other (non HMO) | Admitting: Urgent Care

## 2020-09-18 ENCOUNTER — Other Ambulatory Visit: Payer: Self-pay

## 2020-09-18 ENCOUNTER — Encounter: Payer: Self-pay | Admitting: General Surgery

## 2020-09-18 ENCOUNTER — Encounter
Admission: RE | Disposition: A | Discharge status: Home / Self Care | Payer: Self-pay | Source: Home / Self Care | Attending: General Surgery

## 2020-09-18 ENCOUNTER — Ambulatory Visit
Admission: RE | Admit: 2020-09-18 | Discharge: 2020-09-18 | Disposition: A | Discharge status: Home / Self Care | Payer: Managed Care, Other (non HMO) | Attending: General Surgery | Admitting: General Surgery

## 2020-09-18 DIAGNOSIS — Z791 Long term (current) use of non-steroidal anti-inflammatories (NSAID): Secondary | ICD-10-CM | POA: Diagnosis not present

## 2020-09-18 DIAGNOSIS — Z91040 Latex allergy status: Secondary | ICD-10-CM | POA: Diagnosis not present

## 2020-09-18 DIAGNOSIS — Z79899 Other long term (current) drug therapy: Secondary | ICD-10-CM | POA: Insufficient documentation

## 2020-09-18 DIAGNOSIS — Z91013 Allergy to seafood: Secondary | ICD-10-CM | POA: Diagnosis not present

## 2020-09-18 DIAGNOSIS — K8010 Calculus of gallbladder with chronic cholecystitis without obstruction: Secondary | ICD-10-CM | POA: Diagnosis not present

## 2020-09-18 DIAGNOSIS — K8020 Calculus of gallbladder without cholecystitis without obstruction: Secondary | ICD-10-CM | POA: Diagnosis present

## 2020-09-18 LAB — POCT PREGNANCY, URINE: Preg Test, Ur: NEGATIVE

## 2020-09-18 SURGERY — CHOLECYSTECTOMY, ROBOT-ASSISTED, LAPAROSCOPIC
Anesthesia: General | Site: Abdomen

## 2020-09-18 MED ORDER — DEXMEDETOMIDINE HCL IN NACL 200 MCG/50ML IV SOLN
INTRAVENOUS | Status: DC | PRN
  Administered 2020-09-18: 8 ug via INTRAVENOUS

## 2020-09-18 MED ORDER — LIDOCAINE HCL (CARDIAC) PF 100 MG/5ML IV SOSY
PREFILLED_SYRINGE | INTRAVENOUS | Status: DC | PRN
  Administered 2020-09-18: 50 mg via INTRAVENOUS

## 2020-09-18 MED ORDER — ACETAMINOPHEN 10 MG/ML IV SOLN
INTRAVENOUS | Status: AC
  Filled 2020-09-18: qty 100

## 2020-09-18 MED ORDER — BUPIVACAINE-EPINEPHRINE (PF) 0.25% -1:200000 IJ SOLN
INTRAMUSCULAR | Status: AC
  Filled 2020-09-18: qty 30

## 2020-09-18 MED ORDER — SUGAMMADEX SODIUM 200 MG/2ML IV SOLN
INTRAVENOUS | Status: DC | PRN
  Administered 2020-09-18: 200 mg via INTRAVENOUS

## 2020-09-18 MED ORDER — FENTANYL CITRATE (PF) 100 MCG/2ML IJ SOLN
25.0000 ug | INTRAMUSCULAR | Status: DC | PRN
  Administered 2020-09-18: 25 ug via INTRAVENOUS

## 2020-09-18 MED ORDER — CEFAZOLIN SODIUM-DEXTROSE 2-4 GM/100ML-% IV SOLN
INTRAVENOUS | Status: AC
  Filled 2020-09-18: qty 100

## 2020-09-18 MED ORDER — MIDAZOLAM HCL 2 MG/2ML IJ SOLN
INTRAMUSCULAR | Status: DC | PRN
  Administered 2020-09-18: 2 mg via INTRAVENOUS

## 2020-09-18 MED ORDER — ONDANSETRON HCL 4 MG/2ML IJ SOLN
INTRAMUSCULAR | Status: AC
  Administered 2020-09-18: 4 mg via INTRAVENOUS
  Filled 2020-09-18: qty 2

## 2020-09-18 MED ORDER — DEXAMETHASONE SODIUM PHOSPHATE 10 MG/ML IJ SOLN
INTRAMUSCULAR | Status: DC | PRN
  Administered 2020-09-18: 10 mg via INTRAVENOUS

## 2020-09-18 MED ORDER — INDOCYANINE GREEN 25 MG IV SOLR
1.2500 mg | Freq: Once | INTRAVENOUS | Status: AC
  Administered 2020-09-18: 1.25 mg via INTRAVENOUS
  Filled 2020-09-18: qty 0.5

## 2020-09-18 MED ORDER — CHLORHEXIDINE GLUCONATE 0.12 % MT SOLN: 15.0000 mL | Freq: Once | OROMUCOSAL | Status: AC

## 2020-09-18 MED ORDER — MIDAZOLAM HCL 2 MG/2ML IJ SOLN
INTRAMUSCULAR | Status: AC
  Filled 2020-09-18: qty 2

## 2020-09-18 MED ORDER — HYDROCODONE-ACETAMINOPHEN 5-325 MG PO TABS
ORAL_TABLET | ORAL | Status: AC
  Filled 2020-09-18: qty 1

## 2020-09-18 MED ORDER — BUPIVACAINE-EPINEPHRINE 0.25% -1:200000 IJ SOLN
INTRAMUSCULAR | Status: DC | PRN
  Administered 2020-09-18: 23 mL

## 2020-09-18 MED ORDER — HYDROCODONE-ACETAMINOPHEN 5-325 MG PO TABS
1.0000 | ORAL_TABLET | Freq: Once | ORAL | Status: AC
  Administered 2020-09-18: 1 via ORAL

## 2020-09-18 MED ORDER — FENTANYL CITRATE (PF) 100 MCG/2ML IJ SOLN
INTRAMUSCULAR | Status: DC | PRN
  Administered 2020-09-18 (×4): 50 ug via INTRAVENOUS

## 2020-09-18 MED ORDER — FENTANYL CITRATE (PF) 100 MCG/2ML IJ SOLN
INTRAMUSCULAR | Status: AC
  Administered 2020-09-18: 25 ug via INTRAVENOUS
  Filled 2020-09-18: qty 2

## 2020-09-18 MED ORDER — FAMOTIDINE 20 MG PO TABS
ORAL_TABLET | ORAL | Status: AC
  Administered 2020-09-18: 20 mg via ORAL
  Filled 2020-09-18: qty 1

## 2020-09-18 MED ORDER — LACTATED RINGERS IV SOLN: INTRAVENOUS | Status: DC | PRN

## 2020-09-18 MED ORDER — ONDANSETRON HCL 4 MG/2ML IJ SOLN: 4.0000 mg | Freq: Once | INTRAMUSCULAR | Status: AC | PRN

## 2020-09-18 MED ORDER — FAMOTIDINE 20 MG PO TABS: 20.0000 mg | ORAL_TABLET | Freq: Once | ORAL | Status: AC

## 2020-09-18 MED ORDER — PROPOFOL 10 MG/ML IV BOLUS
INTRAVENOUS | Status: DC | PRN
  Administered 2020-09-18: 160 mg via INTRAVENOUS

## 2020-09-18 MED ORDER — HYDROCODONE-ACETAMINOPHEN 5-325 MG PO TABS: 1.0000 | ORAL_TABLET | ORAL | 0 refills | Status: AC | PRN

## 2020-09-18 MED ORDER — ONDANSETRON HCL 4 MG/2ML IJ SOLN
INTRAMUSCULAR | Status: DC | PRN
  Administered 2020-09-18: 4 mg via INTRAVENOUS

## 2020-09-18 MED ORDER — CHLORHEXIDINE GLUCONATE 0.12 % MT SOLN
OROMUCOSAL | Status: AC
  Administered 2020-09-18: 15 mL via OROMUCOSAL
  Filled 2020-09-18: qty 15

## 2020-09-18 MED ORDER — APREPITANT 40 MG PO CAPS
ORAL_CAPSULE | ORAL | Status: AC
  Administered 2020-09-18: 40 mg via ORAL
  Filled 2020-09-18: qty 1

## 2020-09-18 MED ORDER — METOPROLOL TARTRATE 5 MG/5ML IV SOLN
INTRAVENOUS | Status: DC | PRN
  Administered 2020-09-18: 2 mg via INTRAVENOUS

## 2020-09-18 MED ORDER — ACETAMINOPHEN 10 MG/ML IV SOLN
INTRAVENOUS | Status: DC | PRN
  Administered 2020-09-18: 1000 mg via INTRAVENOUS

## 2020-09-18 MED ORDER — FENTANYL CITRATE (PF) 100 MCG/2ML IJ SOLN
INTRAMUSCULAR | Status: AC
  Filled 2020-09-18: qty 2

## 2020-09-18 MED ORDER — APREPITANT 40 MG PO CAPS: 40.0000 mg | ORAL_CAPSULE | Freq: Once | ORAL | Status: AC

## 2020-09-18 MED ORDER — LACTATED RINGERS IV SOLN: INTRAVENOUS | Status: DC

## 2020-09-18 MED ORDER — PROPOFOL 10 MG/ML IV BOLUS
INTRAVENOUS | Status: AC
  Filled 2020-09-18: qty 20

## 2020-09-18 MED ORDER — CEFAZOLIN SODIUM-DEXTROSE 2-4 GM/100ML-% IV SOLN
2.0000 g | INTRAVENOUS | Status: AC
  Administered 2020-09-18: 2 g via INTRAVENOUS

## 2020-09-18 MED ORDER — ORAL CARE MOUTH RINSE: 15.0000 mL | Freq: Once | OROMUCOSAL | Status: AC

## 2020-09-18 SURGICAL SUPPLY — 55 items
ADH SKN CLS APL DERMABOND .7 (GAUZE/BANDAGES/DRESSINGS) ×1
APL PRP STRL LF DISP 70% ISPRP (MISCELLANEOUS) ×1
BAG INFUSER PRESSURE 100CC (MISCELLANEOUS) IMPLANT
BAG SPEC RTRVL LRG 6X4 10 (ENDOMECHANICALS) ×1
BLADE SURG SZ11 CARB STEEL (BLADE) ×2 IMPLANT
CANISTER SUCT 1200ML W/VALVE (MISCELLANEOUS) ×2 IMPLANT
CANNULA REDUC XI 12-8 STAPL (CANNULA) ×1
CANNULA REDUCER 12-8 DVNC XI (CANNULA) ×1 IMPLANT
CHLORAPREP W/TINT 26 (MISCELLANEOUS) ×2 IMPLANT
CLIP VESOLOCK LG 6/CT PURPLE (CLIP) ×2 IMPLANT
CLIP VESOLOCK MED LG 6/CT (CLIP) ×2 IMPLANT
COVER LIGHT HANDLE STERIS (MISCELLANEOUS) ×2 IMPLANT
COVER WAND RF STERILE (DRAPES) ×2 IMPLANT
DECANTER SPIKE VIAL GLASS SM (MISCELLANEOUS) ×4 IMPLANT
DEFOGGER SCOPE WARMER CLEARIFY (MISCELLANEOUS) ×2 IMPLANT
DERMABOND ADVANCED (GAUZE/BANDAGES/DRESSINGS) ×1
DERMABOND ADVANCED .7 DNX12 (GAUZE/BANDAGES/DRESSINGS) ×1 IMPLANT
DRAPE ARM DVNC X/XI (DISPOSABLE) ×4 IMPLANT
DRAPE COLUMN DVNC XI (DISPOSABLE) ×1 IMPLANT
DRAPE DA VINCI XI ARM (DISPOSABLE) ×4
DRAPE DA VINCI XI COLUMN (DISPOSABLE) ×1
ELECT REM PT RETURN 9FT ADLT (ELECTROSURGICAL) ×2
ELECTRODE REM PT RTRN 9FT ADLT (ELECTROSURGICAL) ×1 IMPLANT
GLOVE SURG ENC MOIS LTX SZ6.5 (GLOVE) ×4 IMPLANT
GLOVE SURG UNDER POLY LF SZ6.5 (GLOVE) ×4 IMPLANT
GOWN STRL REUS W/ TWL LRG LVL3 (GOWN DISPOSABLE) ×3 IMPLANT
GOWN STRL REUS W/TWL LRG LVL3 (GOWN DISPOSABLE) ×6
GRASPER SUT TROCAR 14GX15 (MISCELLANEOUS) ×2 IMPLANT
IRRIGATOR SUCT 8 DISP DVNC XI (IRRIGATION / IRRIGATOR) IMPLANT
IRRIGATOR SUCTION 8MM XI DISP (IRRIGATION / IRRIGATOR)
IV NS 1000ML (IV SOLUTION)
IV NS 1000ML BAXH (IV SOLUTION) IMPLANT
KIT PINK PAD W/HEAD ARE REST (MISCELLANEOUS) ×2
KIT PINK PAD W/HEAD ARM REST (MISCELLANEOUS) ×1 IMPLANT
LABEL OR SOLS (LABEL) ×2 IMPLANT
MANIFOLD NEPTUNE II (INSTRUMENTS) ×2 IMPLANT
NEEDLE HYPO 22GX1.5 SAFETY (NEEDLE) ×2 IMPLANT
NEEDLE INSUFFLATION 14GA 120MM (NEEDLE) ×2 IMPLANT
NS IRRIG 500ML POUR BTL (IV SOLUTION) ×2 IMPLANT
OBTURATOR OPTICAL STANDARD 8MM (TROCAR) ×1
OBTURATOR OPTICAL STND 8 DVNC (TROCAR) ×1
OBTURATOR OPTICALSTD 8 DVNC (TROCAR) ×1 IMPLANT
PACK LAP CHOLECYSTECTOMY (MISCELLANEOUS) ×2 IMPLANT
POUCH SPECIMEN RETRIEVAL 10MM (ENDOMECHANICALS) ×2 IMPLANT
SEAL CANN UNIV 5-8 DVNC XI (MISCELLANEOUS) ×3 IMPLANT
SEAL XI 5MM-8MM UNIVERSAL (MISCELLANEOUS) ×3
SET TUBE SMOKE EVAC HIGH FLOW (TUBING) ×2 IMPLANT
SOLUTION ELECTROLUBE (MISCELLANEOUS) ×2 IMPLANT
SPONGE LAP 4X18 RFD (DISPOSABLE) IMPLANT
STAPLER CANNULA SEAL DVNC XI (STAPLE) ×1 IMPLANT
STAPLER CANNULA SEAL XI (STAPLE) ×1
SUT MNCRL 4-0 (SUTURE) ×2
SUT MNCRL 4-0 27XMFL (SUTURE) ×1
SUT VICRYL 0 AB UR-6 (SUTURE) ×2 IMPLANT
SUTURE MNCRL 4-0 27XMF (SUTURE) ×1 IMPLANT

## 2020-09-18 NOTE — Op Note (Signed)
Preoperative diagnosis: Cholelithiasis  Postoperative diagnosis: Cholelithiasis  Procedure: Robotic Assisted Laparoscopic Cholecystectomy.   Anesthesia: GETA   Surgeon: Dr. Hazle Quant  Wound Classification: Clean Contaminated  Indications: Patient is a 40 y.o. female developed right upper quadrant pain and on workup was found to have cholelithiasis with a normal common duct. Robotic Assisted Laparoscopic cholecystectomy was elected.  Findings: Stone impacted in the cystic duct Critical view of safety achieved   Cystic duct and artery identified, ligated and divided Adequate hemostasis  Description of procedure: The patient was placed on the operating table in the supine position. General anesthesia was induced. A time-out was completed verifying correct patient, procedure, site, positioning, and implant(s) and/or special equipment prior to beginning this procedure. An orogastric tube was placed. The abdomen was prepped and draped in the usual sterile fashion.  An incision was made in a natural skin line below the umbilicus.  The fascia was elevated and the Veress needle inserted. Proper position was confirmed by aspiration and saline meniscus test.  The abdomen was insufflated with carbon dioxide to a pressure of 15 mmHg. The patient tolerated insufflation well. A 8-mm trocar was then inserted in optiview fashion.  The laparoscope was inserted and the abdomen inspected. No injuries from initial trocar placement were noted. Additional trocars were then inserted in the following locations: an 8-mm trocar in the left lateral abdomen, and another two 8-mm trocars to the right side of the abdomen 5 cm appart. The umbilical trocar was changed to a 12 mm trocar all under direct visualization. The abdomen was inspected and no abnormalities were found. The table was placed in the reverse Trendelenburg position with the right side up. The robotic arms were docked and target anatomy identified.  Instrument inserted under direct visualization.  Filmy adhesions between the gallbladder and omentum, duodenum and transverse colon were lysed with electrocautery. The dome of the gallbladder was grasped with a prograsp and retracted over the dome of the liver. The infundibulum was also grasped with an atraumatic grasper and retracted toward the right lower quadrant. This maneuver exposed Calot's triangle. The peritoneum overlying the gallbladder infundibulum was then incised and the cystic duct and cystic artery identified and circumferentially dissected. Initially it was seen that it was very dilated. A very difficult and time consuming dissection of the cystic duct down to the common bile duct was needed to be able to include the impacted stone in the cystic duct that was dilating the cystic duct.  Again, the critical view of safety reviewed before ligating any structure. Firefly images taken to visualize biliary ducts. The cystic duct and cystic artery were then doubly clipped and divided close to the gallbladder.  The gallbladder was then dissected from its peritoneal attachments by electrocautery. Hemostasis was checked and the gallbladder and contained stones were removed using an endoscopic retrieval bag. The gallbladder was passed off the table as a specimen. There was no evidence of bleeding from the gallbladder fossa or cystic artery or leakage of the bile from the cystic duct stump. Secondary trocars were removed under direct vision. No bleeding was noted. The robotic arms were undoked. The scope was withdrawn and the umbilical trocar removed. The abdomen was allowed to collapse. The fascia of the 28mm trocar sites was closed with figure-of-eight 0 vicryl sutures. The skin was closed with subcuticular sutures of 4-0 monocryl and topical skin adhesive. The orogastric tube was removed.  The patient tolerated the procedure well and was taken to the postanesthesia care unit in  stable condition.    Specimen: Gallbladder  Complications: None  EBL: 5 mL

## 2020-09-18 NOTE — Discharge Instructions (Signed)
Minimally Invasive Cholecystectomy, Care After This sheet gives you information about how to care for yourself after your procedure. Your doctor may also give you more specific instructions. If you have problems or questions, contact your doctor. What can I expect after the procedure? After the procedure, it is common:  To have pain at the areas of surgery. You will be given medicines for pain.  To vomit or feel like you may vomit.  To feel fullness in the belly (bloating) or to have pain in the shoulder. This comes from the gas that was used during the surgery. Follow these instructions at home: Medicines  Take over-the-counter and prescription medicines only as told by your doctor.  If you were prescribed an antibiotic medicine, take it as told by your doctor. Do not stop using the antibiotic even if you start to feel better.  Ask your doctor if the medicine prescribed to you: ? Requires you to avoid driving or using machinery. ? Can cause trouble pooping (constipation). You may need to take these actions to prevent or treat trouble pooping:  Drink enough fluid to keep your pee (urine) pale yellow.  Take over-the-counter or prescription medicines.  Eat foods that are high in fiber. These include beans, whole grains, and fresh fruits and vegetables.  Limit foods that are high in fat and sugar. These include fried or sweet foods. Incision care  Follow instructions from your doctor about how to take care of your cuts from surgery (incisions). Make sure you: ? Wash your hands with soap and water for at least 20 seconds before and after you change your bandage (dressing). If you cannot use soap and water, use hand sanitizer. ? Change your bandage as told by your doctor. ? Leave stitches (sutures), skin glue, or skin tape (adhesive) strips in place. They may need to stay in place for 2 weeks or longer. If tape strips get loose and curl up, you may trim the loose edges. Do not remove tape  strips completely unless your doctor says it is okay.  Do not take baths, swim, or use a hot tub until your doctor approves. Ask your doctor if you may take showers. You may only be allowed to take sponge baths.  Check your surgery area every day for signs of infection. Check for: ? More redness, swelling, or pain. ? Fluid or blood. ? Warmth. ? Pus or a bad smell.   Activity  Rest as told by your doctor.  Do not sit for a long time without moving. Get up to take short walks every 1-2 hours. This is important. Ask for help if you feel weak or unsteady.  Do not lift anything that is heavier than 10 lb (4.5 kg), or the limit that you are told, until your doctor says that it is safe.  Do not play contact sports until your doctor says it is okay.  Do not return to work or school until your doctor says it is okay.  Return to your normal activities as told by your doctor. Ask your doctor what activities are safe for you. General instructions  If you were given a medicine to help you relax (sedative) during your procedure, it can affect you for many hours. Do not drive or use machinery until your doctor says that it is safe.  Keep all follow-up visits as told by your doctor. This is important. Contact a doctor if:  You get a rash.  You have more redness, swelling, or pain around   your cuts from surgery.  You have fluid or blood coming from your cuts from surgery.  Your cuts from surgery feel warm to the touch.  You have pus or a bad smell coming from your cuts from surgery.  You have a fever.  One or more of your cuts from surgery breaks open. Get help right away if:  You have trouble breathing.  You have chest pain.  You have pain that is getting worse in your shoulders.  You faint or feel dizzy when you stand.  You have very bad pain in your belly (abdomen).  You feel like you may vomit or you vomit, and this lasts for more than one day.  You have leg  pain. Summary  After your surgery, it is common to have pain at the areas of surgery. You may also have vomiting or fullness in the belly.  Follow your doctor's instructions about medicine, activity restrictions, and caring for your surgery areas. Do not do activities that require a lot of effort.  Contact a doctor if you have a fever or other signs of infection, such as more redness, swelling, or pain around the cuts from surgery.  Get help right away if you have chest pain, increasing pain in the shoulders, or trouble breathing. This information is not intended to replace advice given to you by your health care provider. Make sure you discuss any questions you have with your health care provider. Document Revised: 04/04/2019 Document Reviewed: 04/04/2019 Elsevier Patient Education  2021 [employer]   AMBULATORY SURGERY  DISCHARGE INSTRUCTIONS   1) The drugs that you were given will stay in your system until tomorrow so for the next 24 hours you should not:  A) Drive an automobile B) Make any legal decisions C) Drink any alcoholic beverage   2) You may resume regular meals tomorrow.  Today it is better to start with liquids and gradually work up to solid foods.  You may eat anything you prefer, but it is better to start with liquids, then soup and crackers, and gradually work up to solid foods.   3) Please notify your doctor immediately if you have any unusual bleeding, trouble breathing, redness and pain at the surgery site, drainage, fever, or pain not relieved by medication.    Please contact your physician with any problems or Same Day Surgery at [phone], Monday through Friday 6 am to 4 pm, or [place] at [facility] number at [phone]. Diet: Resume home heart healthy regular diet.   Activity: No heavy lifting >20 pounds (children, pets, laundry, garbage) or strenuous activity until follow-up, but light activity and walking are encouraged. Do not drive or  drink alcohol if taking narcotic pain medications.  Wound care: May shower with soapy water and pat dry (do not rub incisions), but no baths or submerging incision underwater until follow-up. (no swimming)   Medications: Resume all home medications. For mild to moderate pain: acetaminophen (Tylenol) or ibuprofen (if no kidney disease). Combining Tylenol with alcohol can substantially increase your risk of causing liver disease. Narcotic pain medications, if prescribed, can be used for severe pain, though may cause nausea, constipation, and drowsiness. Do not combine Tylenol and Norco within a 6 hour period as Norco contains Tylenol. If you do not need the narcotic pain medication, you do not need to fill the prescription.  Call office [phone]) at any time if any questions, worsening pain, fevers/chills, bleeding, drainage from incision site, or other concerns.

## 2020-09-18 NOTE — Interval H&P Note (Signed)
History and Physical Interval Note:  09/18/2020 7:44 AM  Catherine Berg  has presented today for surgery, with the diagnosis of Cholelithiasis without cholecystitis K80.20.  The various methods of treatment have been discussed with the patient and family. After consideration of risks, benefits and other options for treatment, the patient has consented to  Procedure(s): XI ROBOTIC ASSISTED LAPAROSCOPIC CHOLECYSTECTOMY (N/A) as a surgical intervention.  The patient's history has been reviewed, patient examined, no change in status, stable for surgery.  I have reviewed the patient's chart and labs.  Questions were answered to the patient's satisfaction.     Carolan Shiver

## 2020-09-18 NOTE — OR PostOp (Incomplete)
PACU TO INPATIENT HANDOFF REPORT  Name/Age/Gender Catherine Berg 40 y.o. female  Code Status   Home/SNF/Other {Discharge Destination:18313::"Home"}  Chief Complaint Cholelithiasis without cholecystitis K80.20  Level of Care/Admitting Diagnosis ED Disposition    None      Medical History Past Medical History:  Diagnosis Date  . Abnormal thyroid blood test   . Anemia   . Anxiety   . Asthma   . Complication of anesthesia    itching after c sections  . COVID-19 01/2020  . Depression   . 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 Graves disease   . History of abnormal cervical Pap smear   . History of cervical polypectomy   . Hives 09/02/2015  . Hypertension   . Hypothyroidism   . IFG (impaired fasting glucose)   . Incidental lung nodule, > 54mm and < 24mm 03/31/2017   4 mm RLL lung nodule on chest CT Mar 31, 2017  . Low serum vitamin D   . Obesity   . PONV (postoperative nausea and vomiting)    after c section had vomit  . Pregnancy induced hypertension    with 1st pregnancy, normal pressure with 2nd  . Reflux   . Thyroid disease   . Vitamin B12 deficiency   . Vitamin D deficiency disease   . Wears dentures    full upper    Allergies Allergies  Allergen Reactions  . Latex Hives    (Balloons, condoms, underwear elastic, gloves)  . Shellfish Allergy Hives    IV Location/Drains/Wounds Patient Lines/Drains/Airways Status    Active Line/Drains/Airways    Name Placement date Placement time Site Days   Peripheral IV 09/18/20 Left Hand 09/18/20  0759  Hand  less than 1   Peripheral IV 09/18/20 Right Hand 09/18/20  -  Hand  less than 1   Incision (Closed) 09/18/20 Abdomen Other (Comment) 09/18/20  1009  - less than 1   Incision - 4 Ports Right;Mid Right;Lateral Left;Mid Left;Lateral 09/18/20  0845  - less than 1          Labs/Imaging Results for orders placed or performed during the hospital encounter of 09/18/20 (from the past 48  hour(s))  Pregnancy, urine POC     Status: None   Collection Time: 09/18/20  7:26 AM  Result Value Ref Range   Preg Test, Ur NEGATIVE NEGATIVE    Comment:        THE SENSITIVITY OF THIS METHODOLOGY IS >24 mIU/mL    No results found.  Pending Labs   Vitals/Pain Today's Vitals   09/18/20 1105 09/18/20 1115 09/18/20 1130 09/18/20 1145  BP:  (!) 143/80 (!) 141/77 (!) 151/78  Pulse: 78 62 (!) 58 68  Resp: 17 11 14 14   Temp:      TempSrc:      SpO2: 97% 99% 100% 99%  PainSc: 6  6   7      Isolation Precautions @ISOLATION @  Administered Medications Periop Administered Meds from 09/18/2020 0727 to 09/18/2020 1207      Date/Time Order Dose Route Action Action by Comments    09/18/2020 0747 acetaminophen (OFIRMEV) IV 1,000 mg Intravenous Given 09/20/2020, CRNA     09/18/2020 0747 aprepitant (EMEND) capsule 40 mg 40 mg Oral Given 09/20/2020, RN     09/18/2020 0858 bupivacaine-EPINEPHrine (MARCAINE W/ EPI) 0.25% -1:200000 (with pres) injection 23 mL Infiltration Given 09/20/2020, MD     09/18/2020 0842 ceFAZolin (ANCEF) 2-4 GM/100ML-% IVPB  Override pull for Anesthesia Danelle Berry, CRNA Filed by anesthesia medication administration from clinical order [id number]    09/18/2020 0837 ceFAZolin (ANCEF) IVPB 2g/100 mL premix 2 g Intravenous Given Danelle Berry, CRNA     09/18/2020 0747 chlorhexidine (PERIDEX) 0.12 % solution 15 mL 15 mL Mouth/Throat Given Theodore Demark, [place]     09/18/2020 8502 dexamethasone (DECADRON) injection 10 mg Intravenous Given Danelle Berry, CRNA     09/18/2020 1017 dexmedetomidine (PRECEDEX) 200 MCG/50ML (4 mcg/mL) infusion 8 mcg Intravenous Given Danelle Berry, CRNA     09/18/2020 0748 famotidine (PEPCID) tablet 20 mg 20 mg Oral Given Theodore Demark, RN     09/18/2020 1025 fentaNYL (SUBLIMAZE) injection 50 mcg Intravenous Given Danelle Berry, CRNA     09/18/2020 0903 fentaNYL (SUBLIMAZE) injection 50 mcg Intravenous Given Danelle Berry,  CRNA     09/18/2020 0848 fentaNYL (SUBLIMAZE) injection 50 mcg Intravenous Given Danelle Berry, CRNA     09/18/2020 0835 fentaNYL (SUBLIMAZE) injection 50 mcg Intravenous Given Danelle Berry, CRNA     09/18/2020 1049 fentaNYL (SUBLIMAZE) injection 25 mcg 25 mcg Intravenous Given by Gershon Mussel, RN     09/18/2020 1040 fentaNYL (SUBLIMAZE) injection 25 mcg 25 mcg Intravenous Given Sharlot Gowda, RN     09/18/2020 1105 HYDROcodone-acetaminophen (NORCO/VICODIN) 5-325 MG per tablet 1 tablet 1 tablet Oral Given Sharlot Gowda, RN     09/18/2020 0759 indocyanine green (IC-GREEN) injection 1.25 mg 1.25 mg Intravenous Given Theodore Demark, RN     09/18/2020 0801 lactated ringers infusion   Intravenous New Bag/Given Theodore Demark, RN     09/18/2020 0827 lactated ringers infusion   Intravenous New Bag/Given Danelle Berry, CRNA     09/18/2020 0835 lidocaine (cardiac) 100 mg/47mL (XYLOCAINE) injection 2% 50 mg Intravenous Given Danelle Berry, CRNA     09/18/2020 0747 MEDLINE mouth rinse   Mouth Rinse See Alternative Theodore Demark, RN     09/18/2020 0855 metoprolol tartrate (LOPRESSOR) injection 2 mg Intravenous Given Danelle Berry, CRNA     09/18/2020 0826 midazolam (VERSED) injection 2 mg Intravenous Given Danelle Berry, CRNA     09/18/2020 1003 ondansetron (ZOFRAN) injection 4 mg Intravenous Given Danelle Berry, CRNA     09/18/2020 1041 ondansetron (ZOFRAN) injection 4 mg 4 mg Intravenous Given Sharlot Gowda, RN     09/18/2020 0835 propofol (DIPRIVAN) 10 mg/mL bolus/IV push 160 mg Intravenous Given Danelle Berry, CRNA     09/18/2020 1010 sugammadex sodium (BRIDION) injection 200 mg Intravenous Given Danelle Berry, CRNA       Mobility {Mobility:20148}

## 2020-09-18 NOTE — Anesthesia Preprocedure Evaluation (Signed)
Anesthesia Evaluation  Patient identified by MRN, date of birth, ID band Patient awake    Reviewed: Allergy & Precautions, H&P , NPO status , Patient's Chart, lab work & pertinent test results, reviewed documented beta blocker date and time   History of Anesthesia Complications (+) PONV and history of anesthetic complications  Airway Mallampati: II  TM Distance: >3 FB Neck ROM: full    Dental  (+) Teeth Intact   Pulmonary neg pulmonary ROS, asthma ,    Pulmonary exam normal        Cardiovascular Exercise Tolerance: Good hypertension, On Medications + CAD and + Orthopnea  negative cardio ROS Normal cardiovascular exam Rhythm:regular Rate:Normal     Neuro/Psych Anxiety Depression  Neuromuscular disease negative neurological ROS  negative psych ROS   GI/Hepatic negative GI ROS, Neg liver ROS,   Endo/Other  Hypothyroidism Morbid obesity  Renal/GU negative Renal ROS  negative genitourinary   Musculoskeletal   Abdominal   Peds  Hematology negative hematology ROS (+) Blood dyscrasia, anemia ,   Anesthesia Other Findings Past Medical History: No date: Abnormal thyroid blood test No date: Anemia No date: Anxiety No date: Asthma No date: Complication of anesthesia     Comment:  itching after c sections 01/2020: COVID-19 No date: Depression No date: Elevated serum glutamic pyruvic transaminase (SGPT) level No date: Graves disease No date: History of abnormal cervical Pap smear No date: History of cervical polypectomy 09/02/2015: Hives No date: Hypertension No date: Hypothyroidism No date: IFG (impaired fasting glucose) 03/31/2017: Incidental lung nodule, > 66mm and < 47mm     Comment:  4 mm RLL lung nodule on chest CT Mar 31, 2017 No date: Low serum vitamin D No date: Obesity No date: PONV (postoperative nausea and vomiting)     Comment:  after c section had vomit No date: Pregnancy induced hypertension      Comment:  with 1st pregnancy, normal pressure with 2nd No date: Reflux No date: Thyroid disease No date: Vitamin B12 deficiency No date: Vitamin D deficiency disease No date: Wears dentures     Comment:  full upper Past Surgical History: No date: CERVICAL POLYPECTOMY No date: CESAREAN SECTION     Comment:  x 2 11/14/2018: COLONOSCOPY WITH PROPOFOL; N/A     Comment:  Procedure: COLONOSCOPY WITH PROPOFOL;  Surgeon:               Pasty Spillers, MD;  Location: [facility];              Service: Endoscopy;  Laterality: N/A; 11/14/2018: ESOPHAGOGASTRODUODENOSCOPY (EGD) WITH PROPOFOL; N/A     Comment:  Procedure: ESOPHAGOGASTRODUODENOSCOPY (EGD) WITH               PROPOFOL;  Surgeon: Pasty Spillers, MD;  Location:               [facility];  Service: Endoscopy;  Laterality:               N/A;  Latex allergy 12/27/2018: GIVENS CAPSULE STUDY; N/A     Comment:  Procedure: GIVENS CAPSULE STUDY;  Surgeon: Pasty Spillers, MD;  Location: [facility];  Service:               Endoscopy;  Laterality: N/A;   Reproductive/Obstetrics negative OB ROS  Anesthesia Physical Anesthesia Plan  ASA: II  Anesthesia Plan: General ETT   Post-op Pain Management:    Induction:   PONV Risk Score and Plan: 4 or greater  Airway Management Planned:   Additional Equipment:   Intra-op Plan:   Post-operative Plan:   Informed Consent: I have reviewed the patients History and Physical, chart, labs and discussed the procedure including the risks, benefits and alternatives for the proposed anesthesia with the patient or authorized representative who has indicated his/her understanding and acceptance.     Dental Advisory Given  Plan Discussed with: CRNA  Anesthesia Plan Comments:         Anesthesia Quick Evaluation

## 2020-09-18 NOTE — Anesthesia Postprocedure Evaluation (Signed)
Anesthesia Post Note  Patient: Catherine Berg  Procedure(s) Performed: XI ROBOTIC ASSISTED LAPAROSCOPIC CHOLECYSTECTOMY (N/A Abdomen)  Patient location during evaluation: PACU Anesthesia Type: General Level of consciousness: awake and alert Pain management: pain level controlled Vital Signs Assessment: post-procedure vital signs reviewed and stable Respiratory status: spontaneous breathing, nonlabored ventilation, respiratory function stable and patient connected to nasal cannula oxygen Cardiovascular status: blood pressure returned to baseline and stable Postop Assessment: no apparent nausea or vomiting Anesthetic complications: no   No complications documented.   Last Vitals:  Vitals:   09/18/20 1215 09/18/20 1230  BP: 124/74 127/77  Pulse: 70 73  Resp: 11 11  Temp: (!) 36.3 C   SpO2: 99% 99%    Last Pain:  Vitals:   09/18/20 1215  TempSrc:   PainSc: 6                  Yevette Edwards

## 2020-09-18 NOTE — Transfer of Care (Signed)
Immediate Anesthesia Transfer of Care Note  Patient: Catherine Berg  Procedure(s) Performed: XI ROBOTIC ASSISTED LAPAROSCOPIC CHOLECYSTECTOMY (N/A Abdomen)  Patient Location: PACU  Anesthesia Type:General  Level of Consciousness: awake, alert  and oriented  Airway & Oxygen Therapy: Patient Spontanous Breathing and Patient connected to face mask oxygen  Post-op Assessment: Report given to RN  Post vital signs: Reviewed and stable  Last Vitals:  Vitals Value Taken Time  BP 138/78 09/18/20 1021  Temp    Pulse 77 09/18/20 1024  Resp 10 09/18/20 1024  SpO2 100 % 09/18/20 1024  Vitals shown include unvalidated device data.  Last Pain:  Vitals:   09/18/20 0746  TempSrc: Temporal  PainSc: 2          Complications: No complications documented.

## 2020-09-18 NOTE — Telephone Encounter (Signed)
Lm x2 for patient.  Will close encounter per office protocol.   

## 2020-09-18 NOTE — Anesthesia Procedure Notes (Signed)
Procedure Name: Intubation Date/Time: 09/18/2020 8:37 AM Performed by: Danelle Berry, CRNA Pre-anesthesia Checklist: Patient identified, Emergency Drugs available, Suction available and Patient being monitored Patient Re-evaluated:Patient Re-evaluated prior to induction Oxygen Delivery Method: Circle system utilized Preoxygenation: Pre-oxygenation with 100% oxygen Induction Type: IV induction Ventilation: Mask ventilation without difficulty Laryngoscope Size: McGraph and 3 Grade View: Grade I Tube type: Oral Tube size: 7.0 mm Number of attempts: 1 Airway Equipment and Method: Stylet and Oral airway Placement Confirmation: ETT inserted through vocal cords under direct vision,  positive ETCO2 and breath sounds checked- equal and bilateral Tube secured with: Tape Dental Injury: Teeth and Oropharynx as per pre-operative assessment

## 2020-09-19 LAB — SURGICAL PATHOLOGY

## 2020-09-20 ENCOUNTER — Other Ambulatory Visit: Payer: Self-pay

## 2020-09-20 ENCOUNTER — Ambulatory Visit
Admission: RE | Admit: 2020-09-20 | Discharge: 2020-09-20 | Disposition: A | Discharge status: Home / Self Care | Payer: Managed Care, Other (non HMO) | Source: Ambulatory Visit | Attending: Internal Medicine | Admitting: Internal Medicine

## 2020-09-20 DIAGNOSIS — R918 Other nonspecific abnormal finding of lung field: Secondary | ICD-10-CM | POA: Diagnosis not present

## 2020-09-23 ENCOUNTER — Ambulatory Visit: Payer: 59 | Admitting: Licensed Clinical Social Worker

## 2020-09-23 ENCOUNTER — Other Ambulatory Visit: Payer: Self-pay
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
## 2020-09-24 ENCOUNTER — Ambulatory Visit (INDEPENDENT_AMBULATORY_CARE_PROVIDER_SITE_OTHER): Payer: Managed Care, Other (non HMO) | Admitting: Nurse Practitioner

## 2020-09-24 VITALS — BP 144/88 | HR 70 | Temp 97.9°F | Resp 18

## 2020-09-24 DIAGNOSIS — Z8616 Personal history of COVID-19: Secondary | ICD-10-CM

## 2020-09-24 DIAGNOSIS — M79605 Pain in left leg: Secondary | ICD-10-CM | POA: Diagnosis not present

## 2020-09-24 DIAGNOSIS — G8929 Other chronic pain: Secondary | ICD-10-CM

## 2020-09-24 DIAGNOSIS — R439 Unspecified disturbances of smell and taste: Secondary | ICD-10-CM | POA: Diagnosis not present

## 2020-09-24 DIAGNOSIS — M79604 Pain in right leg: Secondary | ICD-10-CM

## 2020-09-24 NOTE — Assessment & Plan Note (Signed)
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 

## 2020-09-24 NOTE — Patient Instructions (Addendum)
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

## 2020-09-24 NOTE — Progress Notes (Signed)
@Patient  ID: , female    DOB: [birth year], 40 y.o.   MRN: [id number]  Chief Complaint  Patient presents with  . Follow-up    Referring provider: [id number], MD   40 year old female with history of hypertension, coronary artery calcification, asthma, recent cholecystectomy, thyroid disease, Graves' disease, thyroid nodule, anxiety and depression.  The patient was diagnosed with COVID in September 2021.  HPI  Patient presents today for post-COVID care clinic visit.  This is a follow-up visit.  Patient was last seen in our office on 06/27/2020.  She has followed with neurology and has had a sleep study performed.  She has followed with rheumatology.  She has completed speech therapy and physical therapy.  She states that her taste is improving.  She did have a gallbladder surgery last week.  She is also following with pulmonary who has started her on Dulera.  Overall improving.  Patient does complain of chronic bilateral lower extremity pain.  We discussed that we can place a referral for PMR for further evaluation.  Denies f/c/s, n/v/d, hemoptysis, PND, chest pain or edema.      Allergies  Allergen Reactions  . Latex Hives    (Balloons, condoms, underwear elastic, gloves)  . Shellfish Allergy Hives    Immunization History  Administered Date(s) Administered  . Hepatitis A 01/31/2007, 08/10/2007  . Hepatitis B 01/31/2007, 08/10/2007, 07/02/2008  . Influenza,inj,Quad PF,6+ Mos 03/15/2018, 01/05/2019, 03/06/2020  . Td 10/29/1995, 08/03/2017  . Tdap 08/10/2007    Past Medical History:  Diagnosis Date  . Abnormal thyroid blood test   . Anemia   . Anxiety   . Asthma   . Complication of anesthesia    itching after c sections  . COVID-19 01/2020  . Depression   . 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 Graves disease   . History of abnormal cervical Pap smear   . History of cervical polypectomy   . Hives 09/02/2015  . Hypertension   .  Hypothyroidism   . IFG (impaired fasting glucose)   . Incidental lung nodule, > 57mm and < 95mm 03/31/2017   4 mm RLL lung nodule on chest CT Mar 31, 2017  . Low serum vitamin D   . Obesity   . PONV (postoperative nausea and vomiting)    after c section had vomit  . Pregnancy induced hypertension    with 1st pregnancy, normal pressure with 2nd  . Reflux   . Thyroid disease   . Vitamin B12 deficiency   . Vitamin D deficiency disease   . Wears dentures    full upper    Tobacco History: Social History   Tobacco Use  Smoking Status Never Smoker  Smokeless Tobacco Never Used   Counseling given: Yes   Outpatient Encounter Medications as of 09/24/2020  Medication Sig  . albuterol (PROVENTIL HFA;VENTOLIN HFA) 108 (90 Base) MCG/ACT inhaler Inhale 2 puffs into the lungs every 6 (six) hours as needed for wheezing or shortness of breath.  . amphetamine-dextroamphetamine (ADDERALL XR) 15 MG 24 hr capsule Take 1 capsule by mouth daily. (Patient not taking: No sig reported)  . buPROPion (WELLBUTRIN XL) 150 MG 24 hr tablet Take 1 tablet (150 mg total) by mouth daily.  . celecoxib (CELEBREX) 200 MG capsule Take 1 capsule (200 mg total) by mouth daily after lunch.  . clobetasol (TEMOVATE) 0.05 % external solution Apply 1 application topically 2 (two) times daily. (Patient not taking: No sig reported)  . Cyanocobalamin (  B-12) 1000 MCG SUBL Place 1 tablet under the tongue daily. (Patient not taking: No sig reported)  . DULoxetine (CYMBALTA) 30 MG capsule 90 mg daily. Take along with 60 mg cap (Patient taking differently: Take 30 mg by mouth daily.)  . DULoxetine (CYMBALTA) 60 MG capsule Take 1 capsule (60 mg total) by mouth in the morning. ily (Patient taking differently: Take 60 mg by mouth in the morning.)  . Elastic Bandages & Supports (MEDICAL COMPRESSION STOCKINGS) MISC Wear from morning to night, but do not sleep in these  . etonogestrel (NEXPLANON) 68 MG IMPL implant Inject 68 mg into the skin  once.  . famotidine (PEPCID) 20 MG tablet Take 1 tablet (20 mg total) by mouth daily. (Patient not taking: No sig reported)  . fluticasone (FLONASE) 50 MCG/ACT nasal spray Place 2 sprays into both nostrils daily. (Patient not taking: No sig reported)  . hydrOXYzine (ATARAX/VISTARIL) 10 MG tablet Take 1 tablet (10 mg total) by mouth 3 (three) times daily as needed.  Marland Kitchen levothyroxine (SYNTHROID) 75 MCG tablet Take 75 mcg by mouth daily before breakfast.  . mometasone-formoterol (DULERA) 200-5 MCG/ACT AERO Inhale 2 puffs into the lungs in the morning and at bedtime.  . montelukast (SINGULAIR) 10 MG tablet Take 1 tablet (10 mg total) by mouth at bedtime.  . ondansetron (ZOFRAN ODT) 4 MG disintegrating tablet Take 1 tablet (4 mg total) by mouth every 8 (eight) hours as needed for nausea or vomiting.  Marland Kitchen oxyCODONE-acetaminophen (PERCOCET/ROXICET) 5-325 MG tablet Take 1 tablet by mouth every 4 (four) hours as needed for severe pain.  . pregabalin (LYRICA) 100 MG capsule Take 1 capsule (100 mg total) by mouth at bedtime as needed.  Marland Kitchen tiZANidine (ZANAFLEX) 4 MG tablet Take 0.5-1.5 tablets (2-6 mg total) by mouth every 8 (eight) hours as needed for muscle spasms (muscle tightness).  . traZODone (DESYREL) 50 MG tablet Take 0.5-1 tablets (25-50 mg total) by mouth at bedtime as needed for sleep.  . Vitamin D, Ergocalciferol, (DRISDOL) 1.25 MG (50000 UNIT) CAPS capsule Take 1 capsule (50,000 Units total) by mouth every 7 (seven) days.   No facility-administered encounter medications on file as of 09/24/2020.     Review of Systems  Review of Systems  Constitutional: Negative.  Negative for fatigue and fever.  HENT: Negative.   Respiratory: Negative for cough and shortness of breath.   Cardiovascular: Negative.  Negative for chest pain, palpitations and leg swelling.  Gastrointestinal: Negative.   Musculoskeletal: Positive for myalgias.  Allergic/Immunologic: Negative.   Neurological: Negative.    Psychiatric/Behavioral: Negative.        Physical Exam  BP (!) 144/88   Pulse 70   Temp 97.9 F (36.6 C)   Resp 18   SpO2 99%   Wt Readings from Last 5 Encounters:  09/04/20 230 lb 6.4 oz (104.5 kg)  09/01/20 233 lb 11 oz (106 kg)  08/20/20 233 lb (105.7 kg)  07/17/20 237 lb (107.5 kg)  07/03/20 237 lb 6.4 oz (107.7 kg)     Physical Exam Vitals and nursing note reviewed.  Constitutional:      General: She is not in acute distress.    Appearance: She is well-developed.  Cardiovascular:     Rate and Rhythm: Normal rate and regular rhythm.  Pulmonary:     Effort: Pulmonary effort is normal.     Breath sounds: Normal breath sounds.  Musculoskeletal:     Right lower leg: No edema.     Left lower leg:  No edema.  Neurological:     Mental Status: She is alert and oriented to person, place, and time.  Psychiatric:        Mood and Affect: Mood normal.        Behavior: Behavior normal.      Imaging: CT CHEST WO CONTRAST  Result Date: 09/23/2020 CLINICAL DATA:  Abnormal x-ray.  Lung nodule less than 1 cm. EXAM: CT CHEST WITHOUT CONTRAST TECHNIQUE: Multidetector CT imaging of the chest was performed following the standard protocol without IV contrast. COMPARISON:  CT abdomen pelvis 01/30/2020, CT chest 10/06/2018 FINDINGS: Cardiovascular: Normal heart size. No significant pericardial effusion. The thoracic aorta is normal in caliber. No atherosclerotic plaque of the thoracic aorta. No coronary artery calcifications. Mediastinum/Nodes: No gross hilar adenopathy, noting limited sensitivity for the detection of hilar adenopathy on this noncontrast study. No enlarged mediastinal or axillary lymph nodes. Thyroid gland, trachea, and esophagus demonstrate no significant findings. Lungs/Pleura: Similar-appearing 5 x 4 mm right middle lobe pulmonary nodule (4:70). Similar-appearing right lower lobe subpleural micronodule (4:89). Stable right upper lobe micronodule (4:44). No new pulmonary  nodule. No pulmonary mass. Left lower lobe linear atelectasis. No focal consolidation. No pleural effusion. No pneumothorax. Upper Abdomen: Status post cholecystectomy.  No acute abnormality. Musculoskeletal: No chest wall mass or suspicious bone lesions identified. IMPRESSION: Stable pulmonary nodules.  No further follow-up indicated. Electronically Signed   By: Tish Frederickson M.D.   On: 09/23/2020 03:25   US ABDOMEN LIMITED RUQ (LIVER/GB)  Result Date: 09/01/2020 CLINICAL DATA:  Right upper quadrant pain, nausea, vomiting EXAM: ULTRASOUND ABDOMEN LIMITED RIGHT UPPER QUADRANT COMPARISON:  CT 01/30/2020 FINDINGS: Gallbladder: Gallbladder contains multiple echogenic, shadowing gallstones. Largest stone measuring up to 9 mm in diameter. No significant gallbladder wall thickening or pericholecystic fluid. Sonographic Eulah Pont sign is reportedly negative. Common bile duct: Diameter: 4.9 mm, nondilated Liver: No focal lesion identified. Within normal limits in parenchymal echogenicity. Portal vein is patent on color Doppler imaging with normal direction of blood flow towards the liver. Other: None. IMPRESSION: Cholelithiasis without evidence of acute cholecystitis or significant biliary ductal dilatation. Electronically Signed   By: Kreg Shropshire M.D.   On: 09/01/2020 06:34     Assessment & Plan:   History of COVID-19 Shortness of breath: improved   Stay well hydrated  Stay active  Deep breathing exercises  May take tylenol or fever or pain  Continue to follow pulmonary  Continue inhalers   Fatigue Memory loss: improved  Continue PT exercises  Continue speech - cognitive rehabilitation exercises    Memory loss Olfactory Dysfunction:  Keep appointment with neurology   Chronic Leg pain:  Will place referral to PMR    Follow up:  Follow up in 3 months if needed      Ivonne Andrew, NP 09/24/2020

## 2020-09-27 ENCOUNTER — Other Ambulatory Visit: Payer: Self-pay

## 2020-09-27 ENCOUNTER — Encounter: Payer: Self-pay | Admitting: Psychiatry

## 2020-09-27 ENCOUNTER — Ambulatory Visit (INDEPENDENT_AMBULATORY_CARE_PROVIDER_SITE_OTHER): Payer: 59 | Admitting: Licensed Clinical Social Worker

## 2020-09-27 ENCOUNTER — Telehealth (INDEPENDENT_AMBULATORY_CARE_PROVIDER_SITE_OTHER): Payer: 59 | Admitting: Psychiatry

## 2020-09-27 DIAGNOSIS — F4310 Post-traumatic stress disorder, unspecified: Secondary | ICD-10-CM | POA: Diagnosis not present

## 2020-09-27 DIAGNOSIS — F411 Generalized anxiety disorder: Secondary | ICD-10-CM | POA: Diagnosis not present

## 2020-09-27 DIAGNOSIS — F330 Major depressive disorder, recurrent, mild: Secondary | ICD-10-CM | POA: Diagnosis not present

## 2020-09-27 MED ORDER — BUPROPION HCL ER (XL) 300 MG PO TB24: 300.0000 mg | ORAL_TABLET | Freq: Every day | ORAL | 0 refills | Status: DC

## 2020-09-27 MED ORDER — DULOXETINE HCL 60 MG PO CPEP: ORAL_CAPSULE | ORAL | 0 refills | Status: DC

## 2020-09-27 NOTE — Patient Instructions (Signed)
1. Continue duloxetine 90 mg daily 2. Increase  bupropion 300  mg daily  3. Next appointment: 7/8 at 9 AM

## 2020-09-27 NOTE — Patient Instructions (Signed)
Caring for Your Mental Health Mental health is emotional, psychological, and social well-being. Mental health is just as important as physical health. In fact, mental and physical health are connected, and you need both to be healthy. Some signs of good mental health (well-being) include:  Being able to attend to tasks at home, school, or work.  Being able to manage stress and emotions.  Practicing self-care, which may include: ? A regular exercise pattern. ? A reasonably healthy diet. ? Supportive and trusting relationships. ? The ability to relax and calm yourself (self-calm).  Having pleasurable hobbies and activities to do.  Believing that you have meaning and purpose in your life.  Recovering and adjusting after facing challenges (resilience). You can take steps to build or strengthen these mentally healthy behaviors. There are resources and support to help you with this. Why is caring for mental health important? Caring for your mental health is a big part of staying healthy. Everyone has times when feelings, thoughts, or situations feel overwhelming. Mental health means having the skills to manage what feels overwhelming. If this sense of being overwhelmed persists, however, you might need some help. If you have some of the following signs, you may need to take better care of your mental health or seek help from a health care provider or mental health professional:  Problems with energy or focus.  Changes in eating habits.  Problems sleeping, such as sleeping too much or not enough.  Emotional distress, such as anger, sadness, depression, or anxiety.  Major changes in your relationships.  Losing interest in life or activities that you used to enjoy. If you have any of these symptoms on most days for 2 weeks or longer:  Talk with a close friend or family member about how you are feeling.  Contact your health care provider to discuss your symptoms.  Consider working with a  mental health professional. Your health care provider, family, or friends may be able to recommend a therapist. What can I do to promote emotional and mental health? Managing emotions  Learn to identify emotions and deal with them. Recognizing your emotions is the first step in learning to deal with them.  Practice ways to appropriately express feelings. Remember that you can control your feelings. They do not control you.  Practice stress management techniques, such as: ? Relaxation techniques, like breathing or muscle relaxation exercises. ? Exercise. Regular activity can lower your stress level. ? Changing what you can change and accepting what you cannot change.  Build up your resilience so that you can recover and adjust after big problems or challenges. Practice resilient behaviors and attitudes: ? Set and focus on long-term goals. ? Develop and maintain healthy, supportive relationships. ? Learn to accept change and make the best of the situation. ? Take care of yourself physically by eating a healthy diet, getting plenty of sleep, and exercising regularly. ? Develop self-awareness. Ask others to give feedback about how they see you. ? Practice mindfulness meditation to help you stay calm when dealing with daily challenges. ? Learn to respond to situations in healthy ways, rather than reacting with your emotions. ? Keep a positive attitude, and believe in yourself. Your view of yourself affects your mental health. ? Develop your listening and empathy skills. These will help you deal with difficult situations and communications.  Remember that emotions can be used as a good source of communication and are a great source of energy. Try to laugh and find humor in life.   Sleeping  Get the right amount and quality of sleep. Sleep has a big impact on physical and mental health. To improve your sleep: ? Go to bed and wake up around the same time every day. ? Limit screen time before  bedtime. This includes the use of your cell phone, TV, computer, and tablet. ? Keep your bedroom dark and cool. Activity  Exercise or do some physical activity regularly. This helps: ? Keep your body strong, especially during times of stress. ? Get rid of chemicals in your body (hormones) that build up when you are stressed. ? Build up your resilience.   Eating and drinking  Eat a healthy diet that includes whole grains, vegetables, fresh fruits, and lean proteins. If you have questions about what foods are best for you, ask your health care provider.  Try not to turn to sweet, salty, or otherwise unhealthy foods when you are tired or unhappy. This can lead to unwanted weight gain and is not a healthy way to cope with emotions.   Where to find more information You can find more information about how to care for your mental health from:  National Alliance on Mental Illness (NAMI): www.nami.org  National Institute of Mental Health: www.nimh.nih.gov  Centers for Disease Control and Prevention: www.cdc.gov/hrqol/wellbeing.htm Contact a health care provider if:  You lose interest in being with others or you do not want to leave the house.  You have a hard time completing your normal activities or you have less energy than normal.  You cannot stay focused or you have problems with memory.  You feel that your senses are heightened, and this makes you upset or concerned.  You feel nervous or have rapid mood changes.  You are sleeping or eating more or less than normal.  You question reality or you show odd behavior that disturbs you or others. Get help right away if:  You have thoughts about hurting yourself or others. If you ever feel like you may hurt yourself or others, or have thoughts about taking your own life, get help right away. You can go to your nearest emergency department or call:  Your local emergency services (911 in the [place]  A suicide crisis helpline, such as the  National Suicide Prevention Lifeline at 1-800-273-8255. This is open 24 hours a day. Summary  Mental health is not just the absence of mental illness. It involves understanding your emotions and behaviors, and taking steps to cope with them in a healthy way.  If you have symptoms of mental or emotional distress, get help from family, friends, a health care provider, or a mental health professional.  Practice good mental health behaviors such as stress management skills, self-calming skills, exercise, and healthy sleeping and eating. This information is not intended to replace advice given to you by your health care provider. Make sure you discuss any questions you have with your health care provider. Document Revised: 10/12/2019 Document Reviewed: 10/12/2019 Elsevier Patient Education  2021 Elsevier Inc.  

## 2020-09-27 NOTE — Progress Notes (Signed)
Virtual Visit via Video Note  I connected with Catherine Berg on 09/27/20 at 11:00 AM EDT by a video enabled telemedicine application and verified that I am speaking with the correct person using two identifiers.  Location: Patient: home Provider: remote office [place], [place])   I discussed the limitations of evaluation and management by telemedicine and the availability of in person appointments. The patient expressed understanding and agreed to proceed.  I discussed the assessment and treatment plan with the patient. The patient was provided an opportunity to ask questions and all were answered. The patient agreed with the plan and demonstrated an understanding of the instructions.   The patient was advised to call back or seek an in-person evaluation if the symptoms worsen or if the condition fails to improve as anticipated.  I provided 45 minutes of non-face-to-face time during this encounter.   Catherine Berg R Catherine Funke, LCSW    THERAPIST PROGRESS NOTE  Session Time: 11-11:45a  Participation Level: Active  Behavioral Response: Neat and Well GroomedAlertAnxious  Type of Therapy: Individual Therapy  Treatment Goals addressed: Anxiety  Interventions: CBT and Solution Focused  Summary: Catherine Berg is a 40 y.o. female who presents with improving symptoms associated with PTSD diagnosis. Pt reports that she is continuing to have some stress/anxiety when thinking about son's academic struggles. Pt and son both feel better about recent changes in overall school supports--pts son feels more supported and wants to do well.   Developed guided imagery "safe space" and pt feels that [employer] is her safe space. Pt states that going there would bring her a sense of comfort after her mother passed--pt feels that same sense of comfort and security when she goes presently. Validated pts feelings and supported her unconditionally.   Continued recommendations are as follows: self care  behaviors, positive social engagements, focusing on overall work/home/life balance, and focusing on positive physical and emotional wellness.   Suicidal/Homicidal: No  Therapist Response: Catherine Berg is continuing to develop strategies to reduce anxiety symptoms and improve coping skills. Catherine Berg is compliant with treatment recommendations. Catherine Berg is working hard to recognize an understanding of how thoughts, feelings, and behaviors contribute to anxiety and behavior management. These behaviors are reflective of both personal growth and progress. Treatment to continue as indicated.  Plan: Return again in 4 weeks. Review binge frequency count.  Diagnosis: Axis I: Post Traumatic Stress Disorder    Axis II: No diagnosis    Catherine Berg Catherine Ganesh, LCSW 09/27/2020

## 2020-10-01 ENCOUNTER — Encounter: Payer: Self-pay | Admitting: Physical Medicine and Rehabilitation

## 2020-10-03 ENCOUNTER — Encounter: Payer: Self-pay | Admitting: Family Medicine

## 2020-10-04 ENCOUNTER — Other Ambulatory Visit: Payer: Self-pay

## 2020-10-04 ENCOUNTER — Ambulatory Visit
Admission: EM | Admit: 2020-10-04 | Discharge: 2020-10-04 | Discharge status: Against Medical Advice | Payer: Managed Care, Other (non HMO)

## 2020-10-04 NOTE — ED Triage Notes (Signed)
Patient c/o RT shoulder and  RT rib cage x 4 days.   Patient denies fall or trauma.   Patient endorses increased pain with "lying down and gets worst throughout the day". Patient endorses increase pain with inhalation.   Patient endorses having gallbladder surgery 2 weeks ago.   Patient has taken hydrocodone at home with some relief of symptoms.

## 2020-10-28 ENCOUNTER — Ambulatory Visit (INDEPENDENT_AMBULATORY_CARE_PROVIDER_SITE_OTHER): Payer: 59 | Admitting: Licensed Clinical Social Worker

## 2020-10-28 ENCOUNTER — Other Ambulatory Visit: Payer: Self-pay

## 2020-10-28 DIAGNOSIS — F4310 Post-traumatic stress disorder, unspecified: Secondary | ICD-10-CM | POA: Diagnosis not present

## 2020-10-28 NOTE — Progress Notes (Signed)
Virtual Visit via Video Note  I connected with Catherine Berg on 10/28/20 at  2:00 PM EDT by a video enabled telemedicine application and verified that I am speaking with the correct person using two identifiers.  Location: Patient: home Provider: remote office [place], [place])   I discussed the limitations of evaluation and management by telemedicine and the availability of in person appointments. The patient expressed understanding and agreed to proceed.  I discussed the assessment and treatment plan with the patient. The patient was provided an opportunity to ask questions and all were answered. The patient agreed with the plan and demonstrated an understanding of the instructions.   The patient was advised to call back or seek an in-person evaluation if the symptoms worsen or if the condition fails to improve as anticipated.  I provided 60 minutes of non-face-to-face time during this encounter.   Aydrien Froman R Brynley Cuddeback, LCSW  THERAPIST PROGRESS NOTE  Session Time: 2-3pm  Participation Level: Active  Behavioral Response: NeatAlertDepressed  Type of Therapy: Individual Therapy  Treatment Goals addressed: Diagnosis: depression ; PTSD  Interventions: Supportive and Other: trauma focused  Summary: Catherine Berg is a 40 y.o. female who presents with continuing symptoms related to depression and PTSD diagnosis. Patient reports that overall mood is stable, and that she is getting good quality and quantity of sleep.   Patient reports that she is not getting up to eat food multiple times a night like she has in the past period since last session--patient reports a reduction of getting up to eat five times per night in the past down to maybe two times presently. This is a reflection of progress and good behavior management.  Allowed patient to explore and express thoughts and feelings associated with recent external stressors. Allowed patient to reflect on relationships with children,  and managing extracurricular activities for three teenagers. Daughter is Public house manager, and son is in football workouts presently. Patient feels like she is continuing to experience memory issues, lack of motivation and initiative. Reviewed coping skills/social skills to help manage these.   Patient states that she spends a lot of time in her room, but has been intentional recently about sitting out in the living room with the door open to let the sunshine in. Praised patient about her choice of location and encouraged her to continue stimulating her brain versus staying in the bed for extended periods of time. Encouraged patient to actually go outside and sit if possible.  Patient is continuing to work on body positivity and feels that she feels much better about herself after losing 11 pounds recently. Patient is having some pain in legs, so she's trying to figure out what kind of exercises would work and be helpful for her.  Discussed patient's relationship with siblings, and like how she would like to mend it at some point in time. Discussed multiple ways of doing that, and how patient should wait all win her energy is in a good spot. Patient feels like it would put her in a place of vulnerability and wants to be ready in case siblings reject her attempt to mend relationship  Discussed parenting anxieties in different ways that patient can manage that..   Continued recommendations are as follows: self care behaviors, positive social engagements, focusing on overall work/home/life balance, and focusing on positive physical and emotional wellness.   Suicidal/Homicidal: No  Therapist Response: Catherine Berg is continuing to develop strategies to reduce anxiety symptoms and improve coping skills. Catherine Berg is compliant with treatment  recommendations. Catherine Berg is working hard to recognize an understanding of how thoughts, feelings, and behaviors contribute to anxiety and behavior management. These behaviors are  reflective of both personal growth and progress. Treatment to continue as indicated.  Plan: Return again in 4 weeks.  Diagnosis: Axis I: Post Traumatic Stress Disorder    Axis II: No diagnosis    Ernest Haber Kawena Lyday, LCSW 10/28/2020
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
## 2020-11-08 ENCOUNTER — Telehealth (INDEPENDENT_AMBULATORY_CARE_PROVIDER_SITE_OTHER): Payer: 59 | Admitting: Psychiatry

## 2020-11-08 ENCOUNTER — Other Ambulatory Visit: Payer: Self-pay

## 2020-11-08 ENCOUNTER — Encounter: Payer: Self-pay | Admitting: Psychiatry

## 2020-11-08 DIAGNOSIS — F4310 Post-traumatic stress disorder, unspecified: Secondary | ICD-10-CM | POA: Diagnosis not present

## 2020-11-08 DIAGNOSIS — F411 Generalized anxiety disorder: Secondary | ICD-10-CM

## 2020-11-08 DIAGNOSIS — F330 Major depressive disorder, recurrent, mild: Secondary | ICD-10-CM

## 2020-11-08 MED ORDER — DULOXETINE HCL 30 MG PO CPEP: ORAL_CAPSULE | ORAL | 0 refills | Status: DC

## 2020-11-08 MED ORDER — BUPROPION HCL ER (XL) 150 MG PO TB24: ORAL_TABLET | ORAL | 1 refills | Status: DC

## 2020-11-08 NOTE — Patient Instructions (Signed)
1. Continue duloxetine 90 mg daily 2. Increase  bupropion 450  mg daily  3. Next appointment: 8/26 at 9:30

## 2020-12-03 ENCOUNTER — Ambulatory Visit (INDEPENDENT_AMBULATORY_CARE_PROVIDER_SITE_OTHER): Payer: 59 | Admitting: Licensed Clinical Social Worker

## 2020-12-03 ENCOUNTER — Other Ambulatory Visit: Payer: Self-pay

## 2020-12-03 DIAGNOSIS — F4310 Post-traumatic stress disorder, unspecified: Secondary | ICD-10-CM | POA: Diagnosis not present

## 2020-12-03 NOTE — Progress Notes (Addendum)
Virtual Visit via Video Note  I connected with Catherine Berg on 12/03/20 at 10:00 AM EDT by a video enabled telemedicine application and verified that I am speaking with the correct person using two identifiers.  Location: Patient: home Provider: remote office Catherine Berg, [place])   I discussed the limitations of evaluation and management by telemedicine and the availability of in person appointments. The patient expressed understanding and agreed to proceed.   I discussed the assessment and treatment plan with the patient. The patient was provided an opportunity to ask questions and all were answered. The patient agreed with the plan and demonstrated an understanding of the instructions.   The patient was advised to call back or seek an in-person evaluation if the symptoms worsen or if the condition fails to improve as anticipated.  I provided 40 minutes of non-face-to-face time during this encounter.   Catherine Swanner R Edel Rivero, LCSW   THERAPIST PROGRESS NOTE  Session Time: 10-10:40a  Participation Level: Active  Behavioral Response: Neat and Well GroomedAlertAnxious  Type of Therapy: Individual Therapy  Treatment Goals addressed: Anxiety  Interventions: CBT and Other: boundary-setting  Summary: Catherine Berg is a 40 y.o. female who presents with  improving symptoms related to PTSD diagnosis. Patient reports that overall mood is  stable and that she is managing situational stressors well. Patient reports that she's been trying to compartmentalize anxiety when her children go places outside of the home. Patient reports that she is trying not to track her children using the app life 360. Patient is being intentional about trusting children more. Patient states that daughter wants to spend more time with her, and walk more frequently. Patient reports that her children will be transitioning back into the school environment soon, and patient is looking forward to having a couple of hours of  free time each day. Patient reports that she is being intentional about not staying in her bed or in the bedroom for most of the day, and making herself go into different rooms in the house or go places outside of the home. Patient admits that this is a good intervention, and often feels better after doing this. Patient is also trying to do new things each day. Patient reports that she had some stress associated with church members, and it bothered her enough to speak with the pastor about it. The pastor encouraged her to set boundaries with churchgoers and to not let their attitudes keep her from strengthening her spirituality. Patient found this to be very empowering and is following the advice. Patient states that she also has set boundaries within herself by not cooking Sunday dinners for extended family members anymore. Patient reports that this was stressful, and she has found that she has more peace when she is just cooking for herself and her immediate family members. Patient has found recent joy by joining a choir.Continued recommendations are as follows: self care behaviors, positive social engagements, focusing on overall work/home/life balance, and focusing on positive physical and emotional wellness.   This office note has been dictated.    Suicidal/Homicidal: No  Therapist Response: The ongoing treatment plan includes maintaining current levels of progress and continuing to build skills to manage mood, improve stress/anxiety management, emotion regulation, distress tolerance, and behavior modification. Treatment to continue as indicated.   Plan: Return again in 4 weeks.  Diagnosis: Axis I: Post Traumatic Stress Disorder    Axis II: No diagnosis    Catherine Haber Sheretha Shadd, LCSW 12/03/2020

## 2020-12-17 ENCOUNTER — Telehealth: Payer: Managed Care, Other (non HMO) | Admitting: Physician Assistant

## 2020-12-17 DIAGNOSIS — B373 Candidiasis of vulva and vagina: Secondary | ICD-10-CM

## 2020-12-17 DIAGNOSIS — B3731 Acute candidiasis of vulva and vagina: Secondary | ICD-10-CM

## 2020-12-18 MED ORDER — FLUCONAZOLE 150 MG PO TABS: 150.0000 mg | ORAL_TABLET | Freq: Once | ORAL | 0 refills | Status: AC

## 2020-12-18 NOTE — Progress Notes (Signed)
E-Visit for Vaginal Symptoms  We are sorry that you are not feeling well. Here is how we plan to help! Based on what you shared with me it looks like you: May have a yeast vaginosis  Vaginosis is an inflammation of the vagina that can result in discharge, itching and pain. The cause is usually a change in the normal balance of vaginal bacteria or an infection. Vaginosis can also result from reduced estrogen levels after menopause.  The most common causes of vaginosis are:   Bacterial vaginosis which results from an overgrowth of one on several organisms that are normally present in your vagina.   Yeast infections which are caused by a naturally occurring fungus called candida.   Vaginal atrophy (atrophic vaginosis) which results from the thinning of the vagina from reduced estrogen levels after menopause.   Trichomoniasis which is caused by a parasite and is commonly transmitted by sexual intercourse.  Factors that increase your risk of developing vaginosis include: Medications, such as antibiotics and steroids Uncontrolled diabetes Use of hygiene products such as bubble bath, vaginal spray or vaginal deodorant Douching Wearing damp or tight-fitting clothing Using an intrauterine device (IUD) for birth control Hormonal changes, such as those associated with pregnancy, birth control pills or menopause Sexual activity Having a sexually transmitted infection  Your treatment plan is Diflucan 150mg  tablet to take once. May repeat with the second tab in 72 hours if needed.  Be sure to take all of the medication as directed. Stop taking any medication if you develop a rash, tongue swelling or shortness of breath. Mothers who are breast feeding should consider pumping and discarding their breast milk while on these antibiotics. However, there is no consensus that infant exposure at these doses would be harmful.  Remember that medication creams can weaken latex condoms.   HOME  CARE:  Good hygiene may prevent some types of vaginosis from recurring and may relieve some symptoms:  Avoid baths, hot tubs and whirlpool spas. Rinse soap from your outer genital area after a shower, and dry the area well to prevent irritation. Don't use scented or harsh soaps, such as those with deodorant or antibacterial action. Avoid irritants. These include scented tampons and pads. Wipe from front to back after using the toilet. Doing so avoids spreading fecal bacteria to your vagina.  Other things that may help prevent vaginosis include:  Don't douche. Your vagina doesn't require cleansing other than normal bathing. Repetitive douching disrupts the normal organisms that reside in the vagina and can actually increase your risk of vaginal infection. Douching won't clear up a vaginal infection. Use a latex condom. Both female and female latex condoms may help you avoid infections spread by sexual contact. Wear cotton underwear. Also wear pantyhose with a cotton crotch. If you feel comfortable without it, skip wearing underwear to bed. Yeast thrives in [place] Your symptoms should improve in the next day or two.  GET HELP RIGHT AWAY IF:  You have pain in your lower abdomen ( pelvic area or over your ovaries) You develop nausea or vomiting You develop a fever Your discharge changes or worsens You have persistent pain with intercourse You develop shortness of breath, a rapid pulse, or you faint.  These symptoms could be signs of problems or infections that need to be evaluated by a medical provider now.  MAKE SURE YOU   Understand these instructions. Will watch your condition. Will get help right away if you are not doing well or get  worse.  Thank you for choosing an e-visit.  Your e-visit answers were reviewed by a board certified advanced clinical practitioner to complete your personal care plan. Depending upon the condition, your plan could have included both over the  counter or prescription medications.  Please review your pharmacy choice. Make sure the pharmacy is open so you can pick up prescription now. If there is a problem, you may contact your provider through [employer] and have the prescription routed to another pharmacy.  Your safety is important to [place]. If you have drug allergies check your prescription carefully.   For the next 24 hours you can use MyChart to ask questions about today's visit, request a non-urgent call back, or ask for a work or school excuse. You will get an email in the next two days asking about your experience. I hope that your e-visit has been valuable and will speed your recovery.  I provided 5 minutes of non face-to-face time during this encounter for chart review and documentation.

## 2020-12-21 ENCOUNTER — Emergency Department
Admission: EM | Admit: 2020-12-21 | Discharge: 2020-12-21 | Disposition: A | Discharge status: Home / Self Care | Payer: Managed Care, Other (non HMO) | Attending: Emergency Medicine | Admitting: Emergency Medicine

## 2020-12-21 ENCOUNTER — Other Ambulatory Visit: Payer: Self-pay

## 2020-12-21 DIAGNOSIS — L02414 Cutaneous abscess of left upper limb: Secondary | ICD-10-CM | POA: Diagnosis present

## 2020-12-21 DIAGNOSIS — J45909 Unspecified asthma, uncomplicated: Secondary | ICD-10-CM | POA: Insufficient documentation

## 2020-12-21 DIAGNOSIS — Z7951 Long term (current) use of inhaled steroids: Secondary | ICD-10-CM | POA: Insufficient documentation

## 2020-12-21 DIAGNOSIS — L732 Hidradenitis suppurativa: Secondary | ICD-10-CM | POA: Diagnosis not present

## 2020-12-21 DIAGNOSIS — Z8616 Personal history of COVID-19: Secondary | ICD-10-CM | POA: Insufficient documentation

## 2020-12-21 DIAGNOSIS — E039 Hypothyroidism, unspecified: Secondary | ICD-10-CM | POA: Diagnosis not present

## 2020-12-21 DIAGNOSIS — L0291 Cutaneous abscess, unspecified: Secondary | ICD-10-CM

## 2020-12-21 DIAGNOSIS — Z79899 Other long term (current) drug therapy: Secondary | ICD-10-CM | POA: Diagnosis not present

## 2020-12-21 DIAGNOSIS — Z91040 Latex allergy status: Secondary | ICD-10-CM | POA: Diagnosis not present

## 2020-12-21 DIAGNOSIS — I10 Essential (primary) hypertension: Secondary | ICD-10-CM | POA: Insufficient documentation

## 2020-12-21 MED ORDER — LIDOCAINE HCL (PF) 1 % IJ SOLN
5.0000 mL | Freq: Once | INTRAMUSCULAR | Status: AC
  Administered 2020-12-21: 5 mL via INTRADERMAL
  Filled 2020-12-21: qty 5

## 2020-12-21 MED ORDER — OXYCODONE-ACETAMINOPHEN 5-325 MG PO TABS
1.0000 | ORAL_TABLET | Freq: Once | ORAL | Status: AC
  Administered 2020-12-21: 1 via ORAL
  Filled 2020-12-21: qty 1

## 2020-12-21 MED ORDER — HYDROCODONE-ACETAMINOPHEN 5-325 MG PO TABS: 1.0000 | ORAL_TABLET | Freq: Four times a day (QID) | ORAL | 0 refills | Status: DC | PRN

## 2020-12-21 MED ORDER — CEPHALEXIN 500 MG PO CAPS: 500.0000 mg | ORAL_CAPSULE | Freq: Three times a day (TID) | ORAL | 0 refills | Status: AC

## 2020-12-21 MED ORDER — LIDOCAINE-EPINEPHRINE-TETRACAINE (LET) TOPICAL GEL
3.0000 mL | Freq: Once | TOPICAL | Status: AC
  Administered 2020-12-21: 3 mL via TOPICAL
  Filled 2020-12-21: qty 3

## 2020-12-21 NOTE — ED Triage Notes (Signed)
Pt comes pov with hidradenditis flair up under her left arm. Started yesterday and states it has gotten really big lately. They have to be drained occasionally.

## 2020-12-21 NOTE — ED Notes (Addendum)
Pt with swelling under left arm. Pt states she has hx of hidradenditis, and is currently having a flare that is very painful.

## 2020-12-21 NOTE — ED Notes (Signed)
Sterile non stick Dressing applied to left under arm area

## 2020-12-21 NOTE — ED Provider Notes (Signed)
[facility] Emergency Department Provider Note  ____________________________________________   Event Date/Time   First MD Initiated Contact with Patient 12/21/20 [phone]     (approximate)  I have reviewed the triage vital signs and the nursing notes.   HISTORY  Chief Complaint boils    HPI Catherine Berg is a 40 y.o. female presents emergency department with concerns of an abscess in the left axilla.  Patient states she has a history of hidradenitis and gets frequent abscesses.  She states sometimes they will burst on their own but sometimes they need to be lanced along with taking antibiotic.  She has not consulted a surgeon about removal of the sweat gland.  Denies fever or chills.  Past Medical History:  Diagnosis Date   Abnormal thyroid blood test    Anemia    Anxiety    Asthma    Complication of anesthesia    itching after c sections   COVID-19 01/2020   Depression    Elevated serum glutamic pyruvic transaminase (SGPT) level    Graves disease    History of abnormal cervical Pap smear    History of cervical polypectomy    Hives 09/02/2015   Hypertension    Hypothyroidism    IFG (impaired fasting glucose)    Incidental lung nodule, > 76m and < 865m11/28/2018   4 mm RLL lung nodule on chest CT Mar 31, 2017   Low serum vitamin D    Obesity    PONV (postoperative nausea and vomiting)    after c section had vomit   Pregnancy induced hypertension    with 1st pregnancy, normal pressure with 2nd   Reflux    Thyroid disease    Vitamin B12 deficiency    Vitamin D deficiency disease    Wears dentures    full upper    Patient Active Problem List   Diagnosis Date Noted   Chronic pain of both lower extremities 09/24/2020   Anosmia 07/02/2020   Sleeps in sitting position due to orthopnea 07/02/2020   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   COVID-19 long hauler manifesting chronic dyspnea 07/02/2020   Vitamin D deficiency 04/22/2020   COVID-19  long hauler manifesting chronic fatigue 04/22/2020   History of COVID-19 02/28/2020   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   Memory loss 02/28/2020   Physical deconditioning 02/28/2020   Dyspnea on exertion 02/28/2020   Neck pain, bilateral 02/28/2020   Muscle spasms of neck 02/28/2020   Gallstones 01/30/2020   Arthralgia of hand 01/24/2019   CRP elevated 01/24/2019   ESR raised 01/24/2019   Class 3 severe obesity due to excess calories without serious comorbidity in adult [facility]   Palpitations 09/16/2018   Coronary artery calcification 09/16/2018   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   Bilateral leg edema 04/20/2017   Incidental lung nodule, > 54m22mnd < 8mm25m/28/2018   Hives 09/02/2015   Morbid obesity (HCC)[place]/13/2017   Anxiety 07/09/2015   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 06/28/2015   Thyroid nodule 02/01/2015   Vitamin B12 deficiency    History of abnormal cervical Pap smear    Allergy-induced asthma, mild intermittent, uncomplicated 06/025/05/3976Past Surgical History:  Procedure Laterality Date   CERVICAL POLYPECTOMY     CESAREAN SECTION     x 2   COLONOSCOPY WITH PROPOFOL N/A 11/14/2018   Procedure: COLONOSCOPY WITH PROPOFOL;  Surgeon: TahiVirgel Manifold;  [place] [place];  Laterality: N/A;   ESOPHAGOGASTRODUODENOSCOPY (  EGD) WITH PROPOFOL N/A 11/14/2018   Procedure: ESOPHAGOGASTRODUODENOSCOPY (EGD) WITH PROPOFOL;  Surgeon: Virgel Manifold, MD;  Location: [facility];  Service: Endoscopy;  Laterality: N/A;  Latex allergy   GIVENS CAPSULE STUDY N/A 12/27/2018   Procedure: GIVENS CAPSULE STUDY;  Surgeon: Virgel Manifold, MD;  Location: [facility] ENDOSCOPY;  Service: Endoscopy;  Laterality: N/A;    Prior to Admission medications   Medication Sig Start Date End Date Taking? Authorizing Provider  cephALEXin (KEFLEX) 500 MG capsule Take 1 capsule (500 mg total) by mouth 3 (three) times daily for 10 days. 12/21/20  12/31/20 Yes Lewi Drost, Linden Dolin, PA-C  HYDROcodone-acetaminophen (NORCO/VICODIN) 5-325 MG tablet Take 1 tablet by mouth every 6 (six) hours as needed for moderate pain. 12/21/20  Yes Christie Viscomi, Linden Dolin, PA-C  albuterol (PROVENTIL HFA;VENTOLIN HFA) 108 (90 Base) MCG/ACT inhaler Inhale 2 puffs into the lungs every 6 (six) hours as needed for wheezing or shortness of breath. 06/28/18   Laverle Hobby, MD  amphetamine-dextroamphetamine (ADDERALL XR) 15 MG 24 hr capsule Take 1 capsule by mouth daily. Patient not taking: No sig reported 06/10/20   Steele Sizer, MD  buPROPion (WELLBUTRIN XL) 150 MG 24 hr tablet Total of 450 mg daily. Take along with 300 mg tab 11/08/20   Norman Clay, MD  buPROPion (WELLBUTRIN XL) 300 MG 24 hr tablet Take 1 tablet (300 mg total) by mouth daily. 09/27/20 12/26/20  Norman Clay, MD  celecoxib (CELEBREX) 200 MG capsule Take 1 capsule (200 mg total) by mouth daily after lunch. 07/17/20   Steele Sizer, MD  clobetasol (TEMOVATE) 0.05 % external solution Apply 1 application topically 2 (two) times daily. Patient not taking: No sig reported 10/31/19   Steele Sizer, MD  Cyanocobalamin (B-12) 1000 MCG SUBL Place 1 tablet under the tongue daily. Patient not taking: No sig reported 10/05/18   Steele Sizer, MD  DULoxetine (CYMBALTA) 30 MG capsule 90 mg daily. Take along with 60 mg cap 11/21/20   Hisada, Elie Goody, MD  DULoxetine (CYMBALTA) 60 MG capsule 90 mg daily. Take along with 30 mg cap 10/17/20   Norman Clay, MD  Elastic Bandages & Supports (MEDICAL COMPRESSION STOCKINGS) MISC Wear from morning to night, but do not sleep in these 04/20/17   Lada, Satira Anis, MD  etonogestrel (NEXPLANON) 68 MG IMPL implant Inject 68 mg into the skin once.    [provider]  famotidine (PEPCID) 20 MG tablet Take 1 tablet (20 mg total) by mouth daily. Patient not taking: No sig reported 04/20/19   Virgel Manifold, MD  fluticasone (FLONASE) 50 MCG/ACT nasal spray Place 2 sprays into both  nostrils daily. Patient not taking: No sig reported 01/05/19   Steele Sizer, MD  hydrOXYzine (ATARAX/VISTARIL) 10 MG tablet Take 1 tablet (10 mg total) by mouth 3 (three) times daily as needed. 06/27/20   Fenton Foy, NP  levothyroxine (SYNTHROID) 75 MCG tablet Take 75 mcg by mouth daily before breakfast. 11/13/18   Lonia Farber, MD  mometasone-formoterol (DULERA) 200-5 MCG/ACT AERO Inhale 2 puffs into the lungs in the morning and at bedtime. 09/09/20   Flora Lipps, MD  montelukast (SINGULAIR) 10 MG tablet Take 1 tablet (10 mg total) by mouth at bedtime. 06/10/20   Steele Sizer, MD  ondansetron (ZOFRAN ODT) 4 MG disintegrating tablet Take 1 tablet (4 mg total) by mouth every 8 (eight) hours as needed for nausea or vomiting. 09/01/20   Paulette Blanch, MD  oxyCODONE-acetaminophen (PERCOCET/ROXICET) 5-325 MG tablet Take 1  tablet by mouth every 4 (four) hours as needed for severe pain. 09/01/20   Paulette Blanch, MD  pregabalin (LYRICA) 100 MG capsule Take 1 capsule (100 mg total) by mouth at bedtime as needed. 07/17/20   Steele Sizer, MD  tiZANidine (ZANAFLEX) 4 MG tablet Take 0.5-1.5 tablets (2-6 mg total) by mouth every 8 (eight) hours as needed for muscle spasms (muscle tightness). 09/12/19   Delsa Grana, PA-C  traZODone (DESYREL) 50 MG tablet Take 0.5-1 tablets (25-50 mg total) by mouth at bedtime as needed for sleep. 07/17/20   Steele Sizer, MD  Vitamin D, Ergocalciferol, (DRISDOL) 1.25 MG (50000 UNIT) CAPS capsule Take 1 capsule (50,000 Units total) by mouth every 7 (seven) days. 07/17/20   Steele Sizer, MD    Allergies Latex and Shellfish allergy  Family History  Problem Relation Age of Onset   Heart attack Mother 52   Hypertension Mother    Asthma Mother    Cancer Mother        laryngeal   Diabetes Mother        pre diabetic   Heart disease Mother    Mental illness Mother    Alcohol abuse Mother    Drug abuse Mother    Depression Mother    Anxiety disorder Mother     Bipolar disorder Mother    Learning disabilities Brother    ADD / ADHD Brother    Asthma Son    Hyperlipidemia Brother    Alcohol abuse Father    Heart disease Maternal Grandmother        heart attack, pacemaker   Heart attack Maternal Grandmother    Hypertension Maternal Grandmother    Hypertension Maternal Grandfather    Heart disease Paternal Grandmother        couple of major open heart surgeries, leaking valves   Hypertension Paternal Grandmother    Hypertension Paternal Grandfather    Breast cancer Neg Hx     Social History Social History   Tobacco Use   Smoking status: Never   Smokeless tobacco: Never  Vaping Use   Vaping Use: Never used  Substance Use Topics   Alcohol use: No    Alcohol/week: 0.0 standard drinks   Drug use: No    Review of Systems  Constitutional: No fever/chills Eyes: No visual changes. ENT: No sore throat. Respiratory: Denies cough Cardiovascular: Denies chest pain Gastrointestinal: Denies abdominal pain Genitourinary: Negative for dysuria. Musculoskeletal: Negative for back pain. Skin: Negative for rash.  Positive abscess Psychiatric: no mood changes,     ____________________________________________   PHYSICAL EXAM:  VITAL SIGNS: ED Triage Vitals  Enc Vitals Group     BP 12/21/20 0735 (!) 151/96     Pulse Rate 12/21/20 0735 71     Resp 12/21/20 0735 18     Temp 12/21/20 0735 98.4 F (36.9 C)     Temp Source 12/21/20 0735 Oral     SpO2 12/21/20 0735 97 %     Weight 12/21/20 0733 230 lb (104.3 kg)     Height 12/21/20 0733 _0  (1.6 m)     Head Circumference --      Peak Flow --      Pain Score 12/21/20 0733 8     Pain Loc --      Pain Edu? --      Excl. in [place]? --     Constitutional: Alert and oriented. Well appearing and in no acute distress. Eyes: Conjunctivae are normal.  Head: Atraumatic.  Nose: No congestion/rhinnorhea. Mouth/Throat: Mucous membranes are moist.   Neck:  supple no lymphadenopathy  noted Cardiovascular: Normal rate, regular rhythm.  Respiratory: Normal respiratory effort.  No retractions, GU: deferred Musculoskeletal: FROM all extremities, warm and well perfused Neurologic:  Normal speech and language.  Skin:  Skin is warm, dry and intact. No rash noted.  Abscess noted in the left axilla, top portion of his is fluctuant. Psychiatric: Mood and affect are normal. Speech and behavior are normal.  ____________________________________________   LABS (all labs ordered are listed, but only abnormal results are displayed)  Labs Reviewed - No data to display ____________________________________________   ____________________________________________  RADIOLOGY    ____________________________________________   PROCEDURES  Procedure(s) performed:   Marland KitchenMarland KitchenIncision and Drainage  Date/Time: 12/21/2020 11:52 AM Performed by: Versie Starks, PA-C Authorized by: Versie Starks, PA-C   Consent:    Consent obtained:  Verbal   Consent given by:  Patient   Risks, benefits, and alternatives were discussed: yes     Risks discussed:  Bleeding, incomplete drainage, pain, infection and damage to other organs   Alternatives discussed:  No treatment and alternative treatment Universal protocol:    Procedure explained and questions answered to patient or proxy's satisfaction: yes     Patient identity confirmed:  Verbally with patient Location:    Type:  Abscess   Location:  Upper extremity   Upper extremity location:  Shoulder   Shoulder location:  L shoulder Pre-procedure details:    Skin preparation:  Povidone-iodine Sedation:    Sedation type:  None Anesthesia:    Anesthesia method:  Topical application and local infiltration   Topical anesthetic:  LET   Local anesthetic:  Lidocaine 1% w/o epi Procedure type:    Complexity:  Simple Procedure details:    Ultrasound guidance: no     Needle aspiration: no     Incision types:  Stab incision   Wound management:   Irrigated with saline   Drainage:  Purulent   Drainage amount:  Moderate   Wound treatment:  Wound left open Post-procedure details:    Procedure completion:  Tolerated well, no immediate complications    ____________________________________________   INITIAL IMPRESSION / ASSESSMENT AND PLAN / ED COURSE  Pertinent labs & imaging results that were available during my care of the patient were reviewed by me and considered in my medical decision making (see chart for details).   Patient is a 40 year old female presents with abscess to the left axilla.  See HPI.  Physical exam is consistent with an abscess secondary to hidradenitis.  See procedure note for incision and drainage.  Patient tolerated procedure well.  She is given a prescription for Keflex and pain medication.  She is to follow-up with the surgeon.  Return emergency department worsening.  She is discharged stable condition.     AMARIZ FLAMENCO was evaluated in Emergency Department on 12/21/2020 for the symptoms described in the history of present illness. She was evaluated in the context of the global COVID-19 pandemic, which necessitated consideration that the patient might be at risk for infection with the SARS-CoV-2 virus that causes COVID-19. Institutional protocols and algorithms that pertain to the evaluation of patients at risk for COVID-19 are in a state of rapid change based on information released by regulatory bodies including the CDC and federal and state organizations. These policies and algorithms were followed during the patient's care in the ED.    As part of my medical decision making, I reviewed  the following data within the [place] notes reviewed and incorporated, Old chart reviewed, Notes from prior ED visits, and [place] Controlled Substance Database  ____________________________________________   FINAL CLINICAL IMPRESSION(S) / ED DIAGNOSES  Final diagnoses:  Abscess  Hidradenitis  axillaris      NEW MEDICATIONS STARTED DURING THIS VISIT:  Discharge Medication List as of 12/21/2020 10:38 AM     START taking these medications   Details  cephALEXin (KEFLEX) 500 MG capsule Take 1 capsule (500 mg total) by mouth 3 (three) times daily for 10 days., Starting Sat 12/21/2020, Until Tue 12/31/2020, Normal    HYDROcodone-acetaminophen (NORCO/VICODIN) 5-325 MG tablet Take 1 tablet by mouth every 6 (six) hours as needed for moderate pain., Starting Sat 12/21/2020, Normal         Note:  This document was prepared using Dragon voice recognition software and may include unintentional dictation errors.    Versie Starks, PA-C 12/21/20 1154    Lavonia Drafts, MD 12/21/20 [phone]

## 2020-12-25 ENCOUNTER — Other Ambulatory Visit: Payer: Self-pay

## 2020-12-25 ENCOUNTER — Encounter: Payer: Self-pay | Admitting: Nurse Practitioner

## 2020-12-25 ENCOUNTER — Telehealth (INDEPENDENT_AMBULATORY_CARE_PROVIDER_SITE_OTHER): Payer: Managed Care, Other (non HMO) | Admitting: Nurse Practitioner

## 2020-12-25 DIAGNOSIS — R5381 Other malaise: Secondary | ICD-10-CM

## 2020-12-25 DIAGNOSIS — Z8616 Personal history of COVID-19: Secondary | ICD-10-CM | POA: Diagnosis not present

## 2020-12-25 DIAGNOSIS — G9331 Postviral fatigue syndrome: Secondary | ICD-10-CM

## 2020-12-25 DIAGNOSIS — G933 Postviral fatigue syndrome: Secondary | ICD-10-CM | POA: Diagnosis not present

## 2020-12-25 DIAGNOSIS — R413 Other amnesia: Secondary | ICD-10-CM

## 2020-12-25 NOTE — Patient Instructions (Signed)
History of COVID-19 Shortness of breath: improved     Stay well hydrated   Stay active   Deep breathing exercises   May take tylenol or fever or pain   Continue to follow pulmonary   Continue inhalers      Fatigue Memory loss: improved   Continue home PT exercises - water aerobics may be beneficial   Continue speech - home cognitive rehabilitation exercises   May trial gluten free diet    Olfactory Dysfunction:   Continue olfactory retraining with essential oils     Chronic Leg pain:   Keep upcoming appointment with PMR       Follow up:   Follow up in 3 months if needed
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
## 2020-12-25 NOTE — Progress Notes (Signed)
Virtual Visit via Telephone Note  I connected with Catherine Berg on 12/25/20 at 10:00 AM EDT by telephone and verified that I am speaking with the correct person using two identifiers.  Location: Patient: home Provider: office   I discussed the limitations, risks, security and privacy concerns of performing an evaluation and management service by telephone and the availability of in person appointments. I also discussed with the patient that there may be a patient responsible charge related to this service. The patient expressed understanding and agreed to proceed.   History of Present Illness:  Patient presents today for post-COVID care clinic visit follow up through televisit. Patient was last seen in our office on 09/24/20.  She has followed with neurology and has had a sleep study performed.  She has followed with rheumatology.  She has completed speech therapy and physical therapy.  She states that her taste is improving.  She did have a recent gallbladder surgery which has set her recovery back.  She is also following with pulmonary and continues on [facility] which she states is helping.  Overall improving.  Patient does complain of chronic bilateral lower extremity pain. A referral has been placed to PMR and appointment is scheduled in September.  Denies f/c/s, n/v/d, hemoptysis, PND, chest pain or edema.       Observations/Objective:  Vitals with BMI 12/21/2020 12/21/2020 12/21/2020  Height - - 5\' 3"   Weight - - 230 lbs  BMI - - 40.75  Systolic 157 151 -  Diastolic 94 96 -  Pulse 60 71 -  Some encounter information is confidential and restricted. Go to Review Flowsheets activity to see all data.      Assessment and Plan:  History of COVID-19 Shortness of breath: improved     Stay well hydrated   Stay active   Deep breathing exercises   May take tylenol or fever or pain   Continue to follow pulmonary   Continue inhalers      Fatigue Memory loss: improved    Continue home PT exercises - water aerobics may be beneficial   Continue speech - home cognitive rehabilitation exercises   May trial gluten free diet    Olfactory Dysfunction:   Continue olfactory retraining with essential oils     Chronic Leg pain:   Keep upcoming appointment with PMR       Follow up:   Follow up in 3 months if needed           I discussed the assessment and treatment plan with the patient. The patient was provided an opportunity to ask questions and all were answered. The patient agreed with the plan and demonstrated an understanding of the instructions.   The patient was advised to call back or seek an in-person evaluation if the symptoms worsen or if the condition fails to improve as anticipated.  I provided 23 minutes of non-face-to-face time during this encounter.   , NP

## 2020-12-26 ENCOUNTER — Other Ambulatory Visit: Payer: Self-pay

## 2020-12-26 ENCOUNTER — Ambulatory Visit (INDEPENDENT_AMBULATORY_CARE_PROVIDER_SITE_OTHER): Payer: Managed Care, Other (non HMO) | Admitting: Family Medicine

## 2020-12-26 ENCOUNTER — Encounter: Payer: Self-pay | Admitting: Family Medicine

## 2020-12-26 VITALS — BP 140/86 | HR 87 | Temp 98.3°F | Resp 16 | Ht 63.0 in | Wt 230.0 lb

## 2020-12-26 DIAGNOSIS — R0981 Nasal congestion: Secondary | ICD-10-CM

## 2020-12-26 DIAGNOSIS — G5793 Unspecified mononeuropathy of bilateral lower limbs: Secondary | ICD-10-CM

## 2020-12-26 DIAGNOSIS — U099 Post covid-19 condition, unspecified: Secondary | ICD-10-CM

## 2020-12-26 MED ORDER — AZELASTINE HCL 0.1 % NA SOLN: 2.0000 | Freq: Two times a day (BID) | NASAL | 1 refills | Status: DC

## 2020-12-26 NOTE — Patient Instructions (Signed)
[employer] [facility] , [place]

## 2020-12-26 NOTE — Progress Notes (Signed)
Name: Catherine Berg   MRN: [id number]    DOB: [birth year]   Date:12/26/2020       Progress Note  Subjective  Chief Complaint  Disability Forms  HPI  COVI-19 long haul : Gerilynn had [place] 01/2020 and is still struggling with post-viral fatigue, change in sense of smell, cognitive decline, lower extremity pain/neuropathy, joint aches, post-exercise fatigue, SOB with activity and even though she had depression prior to [place] she is now having PTSD and worsening of her depression.  She has seen multiple doctors: [facility], Pulmonologist, Rheumatologist, Psychiatrist, Neurologist, PT, OT, she states she is about 20 % better but still not able to do much around her house and the crying is always present. She is not able to return to work yet.   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   COVID-19 long hauler manifesting chronic dyspnea 07/02/2020   Vitamin D deficiency 04/22/2020   COVID-19 long hauler manifesting chronic fatigue 04/22/2020   History of COVID-19 02/28/2020   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   Memory loss 02/28/2020   Physical deconditioning 02/28/2020   Dyspnea on exertion 02/28/2020   Neck pain, bilateral 02/28/2020   Muscle spasms of neck 02/28/2020   Arthralgia of hand 01/24/2019   CRP elevated 01/24/2019   ESR raised 01/24/2019   Class 3 severe obesity due to excess calories without serious comorbidity in adult [facility]) 01/24/2019   Palpitations 09/16/2018   Coronary artery calcification 09/16/2018   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   Bilateral leg edema 04/20/2017   Incidental  lung nodule, > 58m and < 845m11/28/2018   Hives 09/02/2015   Morbid obesity ([facility]04/13/2017   Anxiety 07/09/2015   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 06/28/2015   Thyroid nodule 02/01/2015   Vitamin B12 deficiency    History of abnormal cervical Pap smear    Allergy-induced asthma, mild intermittent, uncomplicated 0633/[id number]  Past Surgical History:  Procedure Laterality Date   CERVICAL POLYPECTOMY     CESAREAN SECTION     x 2   COLONOSCOPY WITH PROPOFOL N/A 11/14/2018   Procedure: COLONOSCOPY WITH PROPOFOL;  Surgeon: TaVirgel ManifoldMD;  [facility] [facility] Service: Endoscopy;  Laterality: N/A;   ESOPHAGOGASTRODUODENOSCOPY (EGD) WITH PROPOFOL N/A 11/14/2018   Procedure: ESOPHAGOGASTRODUODENOSCOPY (EGD) WITH PROPOFOL;  Surgeon: TaVirgel ManifoldMD;  [place] [facility] Service: Endoscopy;  Laterality: N/A;  Latex allergy   GIVENS CAPSULE STUDY N/A 12/27/2018   Procedure: GIVENS CAPSULE STUDY;  Surgeon: TaVirgel ManifoldMD;  Location: [facility] ENDOSCOPY;  Service: Endoscopy;  Laterality: N/A;    Family History  Problem Relation Age of Onset   Heart attack Mother 4790 Hypertension Mother    Asthma Mother    Cancer Mother        laryngeal   Diabetes Mother        pre diabetic   Heart disease Mother    Mental illness Mother    Alcohol abuse Mother    Drug abuse Mother    Depression Mother    Anxiety disorder Mother  Bipolar disorder Mother    Learning disabilities Brother    ADD / ADHD Brother    Asthma Son    Hyperlipidemia Brother    Alcohol abuse Father    Heart disease Maternal Grandmother        heart attack, pacemaker   Heart attack Maternal Grandmother    Hypertension Maternal Grandmother    Hypertension Maternal Grandfather    Heart disease Paternal Grandmother        couple of major open heart surgeries, leaking valves   Hypertension Paternal Grandmother    Hypertension Paternal Grandfather    Breast cancer Neg Hx     Social  History   Tobacco Use   Smoking status: Never   Smokeless tobacco: Never  Substance Use Topics   Alcohol use: No    Alcohol/week: 0.0 standard drinks     Current Outpatient Medications:    albuterol (PROVENTIL HFA;VENTOLIN HFA) 108 (90 Base) MCG/ACT inhaler, Inhale 2 puffs into the lungs every 6 (six) hours as needed for wheezing or shortness of breath., Disp: 18 g, Rfl: 1   azelastine (ASTELIN) 0.1 % nasal spray, Place 2 sprays into both nostrils 2 (two) times daily. Use in each nostril as directed, Disp: 30 mL, Rfl: 1   buPROPion (WELLBUTRIN XL) 150 MG 24 hr tablet, Total of 450 mg daily. Take along with 300 mg tab, Disp: 30 tablet, Rfl: 1   buPROPion (WELLBUTRIN XL) 300 MG 24 hr tablet, Take 1 tablet (300 mg total) by mouth daily., Disp: 90 tablet, Rfl: 0   celecoxib (CELEBREX) 200 MG capsule, Take 1 capsule (200 mg total) by mouth daily after lunch., Disp: 30 capsule, Rfl: 0   cephALEXin (KEFLEX) 500 MG capsule, Take 1 capsule (500 mg total) by mouth 3 (three) times daily for 10 days., Disp: 40 capsule, Rfl: 0   clobetasol (TEMOVATE) 0.05 % external solution, Apply 1 application topically 2 (two) times daily., Disp: 50 mL, Rfl: 0   DULoxetine (CYMBALTA) 30 MG capsule, 90 mg daily. Take along with 60 mg cap, Disp: 90 capsule, Rfl: 0   DULoxetine (CYMBALTA) 60 MG capsule, 90 mg daily. Take along with 30 mg cap, Disp: 90 capsule, Rfl: 0   Elastic Bandages & Supports ([place]) MISC, Wear from morning to night, but do not sleep in these, Disp: 1 each, Rfl: 2   etonogestrel (NEXPLANON) 68 MG IMPL implant, Inject 68 mg into the skin once., Disp: , Rfl:    hydrOXYzine (ATARAX/VISTARIL) 10 MG tablet, Take 1 tablet (10 mg total) by mouth 3 (three) times daily as needed., Disp: 30 tablet, Rfl: 0   levothyroxine (SYNTHROID) 75 MCG tablet, Take 75 mcg by mouth daily before breakfast., Disp: , Rfl:    mometasone-formoterol (DULERA) 200-5 MCG/ACT AERO, Inhale 2 puffs into the  lungs in the morning and at bedtime., Disp: 1 each, Rfl: 11   montelukast (SINGULAIR) 10 MG tablet, Take 1 tablet (10 mg total) by mouth at bedtime., Disp: 90 tablet, Rfl: 1   pregabalin (LYRICA) 100 MG capsule, Take 1 capsule (100 mg total) by mouth at bedtime as needed., Disp: 90 capsule, Rfl: 0   tiZANidine (ZANAFLEX) 4 MG tablet, Take 0.5-1.5 tablets (2-6 mg total) by mouth every 8 (eight) hours as needed for muscle spasms (muscle tightness)., Disp: 90 tablet, Rfl: 2   traZODone (DESYREL) 50 MG tablet, Take 0.5-1 tablets (25-50 mg total) by mouth at bedtime as needed for sleep., Disp: 90 tablet, Rfl: 0   Vitamin D,  Ergocalciferol, (DRISDOL) 1.25 MG (50000 UNIT) CAPS capsule, Take 1 capsule (50,000 Units total) by mouth every 7 (seven) days., Disp: 12 capsule, Rfl: 1   Cyanocobalamin (B-12) 1000 MCG SUBL, Place 1 tablet under the tongue daily. (Patient not taking: Reported on 12/26/2020), Disp: 30 each, Rfl: 5  Allergies  Allergen Reactions   Latex Hives    (Balloons, condoms, underwear elastic, gloves)   Shellfish Allergy Hives    I personally reviewed active problem list, medication list, allergies, family history, social history, health maintenance with the patient/caregiver today.   ROS  Ten systems reviewed and is negative except as mentioned in HPI   Objective  Vitals:   12/26/20 1140  BP: 140/86  Pulse: 87  Resp: 16  Temp: 98.3 F (36.8 C)  SpO2: 99%  Weight: 230 lb (104.3 kg)  Height: _0  (1.6 m)    Body mass index is 40.74 kg/m.  Physical Exam  Constitutional: Patient appears well-developed and well-nourished. Obese  No distress.  HEENT: head atraumatic, normocephalic, pupils equal and reactive to light, neck supple Cardiovascular: Normal rate, regular rhythm and normal heart sounds.  No murmur heard. No BLE edema. Pulmonary/Chest: Effort normal and breath sounds normal. No respiratory distress. Abdominal: Soft.  There is no tenderness. Psychiatric: Patient  has a depressed mood, crying, some dysarthria but able to express her feelings   PHQ2/9:  phq 9 is positive   Fall Risk: Fall Risk  12/26/2020 08/20/2020 07/17/2020 06/10/2020 05/06/2020  Falls in the past year? 0 0 _1 Number falls in past yr: 0 0 0 0 0  Injury with Fall? 0 0 1 0 0  Comment - - - - -  Risk for fall due to : No Fall Risks - - - -  Follow up Falls prevention discussed - - - -     Functional Status Survey: Is the patient deaf or have difficulty hearing?: No Does the patient have difficulty seeing, even when wearing glasses/contacts?: No Does the patient have difficulty concentrating, remembering, or making decisions?: Yes Does the patient have difficulty walking or climbing stairs?: No Does the patient have difficulty dressing or bathing?: No Does the patient have difficulty doing errands alone such as visiting a doctor's office or shopping?: No    Assessment & Plan  1. COVID-19 long hauler  Still unable to return to work , discussed mindfulness and some buddhist practices.  Discussed the book Untethered soul by Francene Castle   2. Nasal congestion  - azelastine (ASTELIN) 0.1 % nasal spray; Place 2 sprays into both nostrils 2 (two) times daily. Use in each nostril as directed  Dispense: 30 mL; Refill: 1  3. Neuropathy involving both lower extremities   There are no diagnoses linked to this encounter.

## 2020-12-27 ENCOUNTER — Telehealth (HOSPITAL_COMMUNITY): Payer: Self-pay | Admitting: Psychiatry

## 2020-12-27 ENCOUNTER — Telehealth (INDEPENDENT_AMBULATORY_CARE_PROVIDER_SITE_OTHER): Payer: Managed Care, Other (non HMO) | Admitting: Psychiatry

## 2020-12-27 ENCOUNTER — Encounter: Payer: Self-pay | Admitting: Psychiatry

## 2020-12-27 DIAGNOSIS — F330 Major depressive disorder, recurrent, mild: Secondary | ICD-10-CM

## 2020-12-27 DIAGNOSIS — F411 Generalized anxiety disorder: Secondary | ICD-10-CM

## 2020-12-27 DIAGNOSIS — F4310 Post-traumatic stress disorder, unspecified: Secondary | ICD-10-CM | POA: Diagnosis not present

## 2020-12-27 MED ORDER — BUPROPION HCL ER (XL) 300 MG PO TB24: 300.0000 mg | ORAL_TABLET | Freq: Every day | ORAL | 1 refills | Status: DC

## 2020-12-27 MED ORDER — BUPROPION HCL ER (XL) 150 MG PO TB24: ORAL_TABLET | ORAL | 1 refills | Status: DC

## 2020-12-27 MED ORDER — DULOXETINE HCL 60 MG PO CPEP: ORAL_CAPSULE | ORAL | 0 refills | Status: DC

## 2020-12-27 NOTE — Patient Instructions (Signed)
1. Continue duloxetine 90 mg daily 2. Continue bupropion 450  mg daily  3. Next appointment: 10/24 at 11:30

## 2020-12-27 NOTE — Telephone Encounter (Signed)
D:  Dr. Vanetta Shawl referred pt to MH-IOP.  A:  Placed call and oriented pt.  Encouraged pt to call her insurance company to verify benefits.  Pt will start MH-IOP on 01-02-21 at 9 a.m..  Inform Dr. Vanetta Shawl.  R:  Pt receptive.

## 2021-01-01 ENCOUNTER — Other Ambulatory Visit: Payer: Self-pay

## 2021-01-01 ENCOUNTER — Other Ambulatory Visit (HOSPITAL_COMMUNITY): Payer: 59

## 2021-01-02 ENCOUNTER — Other Ambulatory Visit (HOSPITAL_COMMUNITY): Payer: 59 | Admitting: Psychiatry

## 2021-01-02 ENCOUNTER — Other Ambulatory Visit: Payer: Self-pay

## 2021-01-02 ENCOUNTER — Telehealth (HOSPITAL_COMMUNITY): Payer: Self-pay | Admitting: Psychiatry

## 2021-01-02 ENCOUNTER — Encounter (HOSPITAL_COMMUNITY): Payer: Self-pay

## 2021-01-03 ENCOUNTER — Ambulatory Visit (HOSPITAL_COMMUNITY): Payer: Self-pay

## 2021-01-03 ENCOUNTER — Telehealth (HOSPITAL_COMMUNITY): Payer: Self-pay | Admitting: Psychiatry

## 2021-01-03 NOTE — Telephone Encounter (Signed)
D:  Patient logged into group this morning although she didn't start yesterday.  Pt states she didn't listen to vm case manager had left for her yesterday.  A:  Encouraged pt to log off and listen to the vm and case manager would be reaching out to her after check in.  Upon calling pt, pt didn't answer her phone.  Left vm requesting pt to call cm back so she can inquire about yesterday and give pt a new start date. CM had already taken pt off census yesterday whenever she no showed.  Will await pt's return call to reschedule.  Inform Dr. Vanetta Shawl and Trula Ore Hussami, LCSW.

## 2021-01-07 ENCOUNTER — Telehealth (HOSPITAL_COMMUNITY): Payer: Self-pay | Admitting: Psychiatry

## 2021-01-07 ENCOUNTER — Ambulatory Visit (HOSPITAL_COMMUNITY): Payer: Self-pay

## 2021-01-08 ENCOUNTER — Ambulatory Visit (HOSPITAL_COMMUNITY): Payer: Self-pay

## 2021-01-09 ENCOUNTER — Ambulatory Visit (HOSPITAL_COMMUNITY): Payer: Self-pay

## 2021-01-10 ENCOUNTER — Ambulatory Visit (HOSPITAL_COMMUNITY): Payer: Self-pay

## 2021-01-13 ENCOUNTER — Other Ambulatory Visit: Payer: Self-pay | Admitting: Family Medicine

## 2021-01-13 ENCOUNTER — Ambulatory Visit (HOSPITAL_COMMUNITY): Payer: Self-pay

## 2021-01-13 DIAGNOSIS — M5410 Radiculopathy, site unspecified: Secondary | ICD-10-CM

## 2021-01-13 DIAGNOSIS — G5793 Unspecified mononeuropathy of bilateral lower limbs: Secondary | ICD-10-CM

## 2021-01-13 DIAGNOSIS — F5104 Psychophysiologic insomnia: Secondary | ICD-10-CM

## 2021-01-13 MED ORDER — PREGABALIN 100 MG PO CAPS: 100.0000 mg | ORAL_CAPSULE | Freq: Every evening | ORAL | 0 refills | Status: DC | PRN

## 2021-01-13 MED ORDER — TRAZODONE HCL 50 MG PO TABS: 25.0000 mg | ORAL_TABLET | Freq: Every evening | ORAL | 0 refills | Status: DC | PRN

## 2021-01-14 ENCOUNTER — Ambulatory Visit (INDEPENDENT_AMBULATORY_CARE_PROVIDER_SITE_OTHER): Payer: 59 | Admitting: Licensed Clinical Social Worker

## 2021-01-14 ENCOUNTER — Other Ambulatory Visit: Payer: Self-pay

## 2021-01-14 DIAGNOSIS — F4310 Post-traumatic stress disorder, unspecified: Secondary | ICD-10-CM

## 2021-01-14 DIAGNOSIS — F330 Major depressive disorder, recurrent, mild: Secondary | ICD-10-CM

## 2021-01-14 NOTE — Plan of Care (Signed)
Developed plan 

## 2021-01-14 NOTE — Progress Notes (Signed)
Virtual Visit via Video Note  I connected with Catherine Berg on 01/14/21 at 10:00 AM EDT by a video enabled telemedicine application and verified that I am speaking with the correct person using two identifiers.  Pt 15 min late for session  Location: Patient: home Provider: remote office [place], [place])   I discussed the limitations of evaluation and management by telemedicine and the availability of in person appointments. The patient expressed understanding and agreed to proceed.   I discussed the assessment and treatment plan with the patient. The patient was provided an opportunity to ask questions and all were answered. The patient agreed with the plan and demonstrated an understanding of the instructions.   The patient was advised to call back or seek an in-person evaluation if the symptoms worsen or if the condition fails to improve as anticipated.  I provided 45 minutes of non-face-to-face time during this encounter.   Whitney Bingaman R Catherine Toste, LCSW   THERAPIST PROGRESS NOTE  Session Time: 10:15-11a  Participation Level: Active  Behavioral Response: NAAlertAnxious and Depressed  Type of Therapy: Individual Therapy  Treatment Goals addressed: Coping and Diagnosis: depression  Interventions: CBT and Solution Focused  Summary: Catherine Berg is a 40 y.o. female who presents with symptoms consistent with PTSD and MDD.  Pt reports that overall mood has been stable and that she is managing stress and anxiety better.   Allowed pt to explore thoughts and feelings associated with financial stressors. Pt is "in between" short term and long term disability and is struggling financially.   Pt feels symptoms started after having covid.   Pt states that one preferred activity is cooking--pt feels connected to late mother and late grandmother when cooking. Encouraged pt to do more cooking for her own emotional/physical well being.  Continued recommendations are as follows: self  care behaviors, positive social engagements, focusing on overall work/home/life balance, and focusing on positive physical and emotional wellness.    Suicidal/Homicidal: No  Therapist Response: The ongoing treatment plan includes maintaining current levels of progress and continuing to build skills to manage mood, improve stress/anxiety management, emotion regulation, distress tolerance, and behavior modification. Treatment to continue as indicated.  Plan: Return again in 4 weeks.  Diagnosis: Axis I: Post Traumatic Stress Disorder    Axis II: no diagnosis    Catherine Berg Catherine Mawhinney, LCSW 01/14/2021

## 2021-01-14 NOTE — Plan of Care (Signed)
Developed goals

## 2021-01-15 ENCOUNTER — Telehealth: Payer: Self-pay | Admitting: Internal Medicine

## 2021-01-15 NOTE — Telephone Encounter (Signed)
Received Long Term Disability claim paperwork. Emailed to [employer] to review.

## 2021-01-17 NOTE — Telephone Encounter (Signed)
Long term disability form emailed to [employer] office (Melissa McKenzie and Lajoyce Lauber) for Dr. Clovis Fredrickson signature.

## 2021-01-23 ENCOUNTER — Other Ambulatory Visit: Payer: Self-pay

## 2021-01-23 ENCOUNTER — Encounter
Payer: Managed Care, Other (non HMO) | Attending: Physical Medicine and Rehabilitation | Admitting: Physical Medicine and Rehabilitation

## 2021-01-23 ENCOUNTER — Encounter: Payer: Self-pay | Admitting: Physical Medicine and Rehabilitation

## 2021-01-23 VITALS — BP 143/84 | HR 85 | Temp 98.8°F | Ht 63.0 in | Wt 231.8 lb

## 2021-01-23 DIAGNOSIS — M79605 Pain in left leg: Secondary | ICD-10-CM | POA: Insufficient documentation

## 2021-01-23 DIAGNOSIS — Z8616 Personal history of COVID-19: Secondary | ICD-10-CM | POA: Diagnosis not present

## 2021-01-23 DIAGNOSIS — M79604 Pain in right leg: Secondary | ICD-10-CM | POA: Insufficient documentation

## 2021-01-23 DIAGNOSIS — R4701 Aphasia: Secondary | ICD-10-CM | POA: Diagnosis not present

## 2021-01-23 MED ORDER — GABAPENTIN 300 MG PO CAPS: 300.0000 mg | ORAL_CAPSULE | Freq: Three times a day (TID) | ORAL | 3 refills | Status: DC

## 2021-01-23 NOTE — Patient Instructions (Signed)

## 2021-01-23 NOTE — Telephone Encounter (Signed)
Signed disability form faxed to [employer], Attn: Ahmed  fax# [phone].  Document put in to be scanned.

## 2021-01-23 NOTE — Progress Notes (Addendum)
Subjective:    Patient ID: Catherine Berg, female    DOB: 05-03-1981, 40 y.o.   MRN: [id number]  HPI Catherine Berg is a 40 year old woman who presents to establish care for LONG COVID- related bilateral lower extremity peripheral neuropathy  -she has tried a couple of things but nothing has helped -pain feels burning  -extends from her thighs to ankle -she tries to rest her legs so they do not hurt as badly at night.  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   2) confusion:  -it is embarrassing to her -she did speech language therapy   Pain Inventory Average Pain 8 Pain Right Now 5 My pain is burning, tingling, and aching  In the last 24 hours, has pain interfered with the following? General activity 7 Relation with others 8 Enjoyment of life 9 What TIME of day is your pain at its worst? varies Sleep (in general) Fair  Pain is worse with: walking Pain improves with: rest and medication Relief from Meds: 6  walk without assistance how many minutes can you walk? 10-20 ability to climb steps?  yes do you drive?  yes  I need assistance with the following:  household duties and shopping  confusion depression anxiety  New pt  New pt    Family History  Problem Relation Age of Onset   Heart attack Mother 67   Hypertension Mother    Asthma Mother    Cancer Mother        laryngeal   Diabetes Mother        pre diabetic   Heart disease Mother    Mental illness Mother    Alcohol abuse Mother    Drug abuse Mother    Depression Mother    Anxiety disorder Mother    Bipolar disorder Mother    Learning disabilities Brother    ADD / ADHD Brother    Asthma Son    Hyperlipidemia Brother    Alcohol abuse Father    Heart disease Maternal Grandmother        heart attack, pacemaker   Heart attack Maternal Grandmother    Hypertension Maternal Grandmother    Hypertension Maternal Grandfather    Heart disease Paternal  Grandmother        couple of major open heart surgeries, leaking valves   Hypertension Paternal Grandmother    Hypertension Paternal Grandfather    Breast cancer Neg Hx    Social History   Socioeconomic History   Marital status: Single    Spouse name: Not on file   Number of children: 2   Years of education: 11   Highest education level: High school graduate  Occupational History   Not on file  Tobacco Use   Smoking status: Never   Smokeless tobacco: Never  Vaping Use   Vaping Use: Never used  Substance and Sexual Activity   Alcohol use: No    Alcohol/week: 0.0 standard drinks   Drug use: No   Sexual activity: Yes    Partners: Male    Birth control/protection: None  Other Topics Concern   Not on file  Social History Narrative   Not on file   Social Determinants of Health   Financial Resource Strain: Not on file  Food Insecurity: Not on file  Transportation Needs: Not on file  Physical Activity: Not on file  Stress: Not on file  Social Connections: Not on file   Past  Surgical History:  Procedure Laterality Date   CERVICAL POLYPECTOMY     CESAREAN SECTION     x 2   COLONOSCOPY WITH PROPOFOL N/A 11/14/2018   Procedure: COLONOSCOPY WITH PROPOFOL;  Surgeon: Pasty Spillers, MD;  [facility];  Service: Endoscopy;  Laterality: N/A;   ESOPHAGOGASTRODUODENOSCOPY (EGD) WITH PROPOFOL N/A 11/14/2018   Procedure: ESOPHAGOGASTRODUODENOSCOPY (EGD) WITH PROPOFOL;  Surgeon: Pasty Spillers, MD;  Location: [facility];  Service: Endoscopy;  Laterality: N/A;  Latex allergy   GIVENS CAPSULE STUDY N/A 12/27/2018   Procedure: GIVENS CAPSULE STUDY;  Surgeon: Pasty Spillers, MD;  Location: [facility] ENDOSCOPY;  Service: Endoscopy;  Laterality: N/A;   Past Medical History:  Diagnosis Date   Abnormal thyroid blood test    Anemia    Anxiety    Asthma    Complication of anesthesia    itching after c sections   COVID-19 01/2020   Depression     Elevated serum glutamic pyruvic transaminase (SGPT) level    Graves disease    History of abnormal cervical Pap smear    History of cervical polypectomy    Hives 09/02/2015   Hypertension    Hypothyroidism    IFG (impaired fasting glucose)    Incidental lung nodule, > 15mm and < 37mm 03/31/2017   4 mm RLL lung nodule on chest CT Mar 31, 2017   Low serum vitamin D    Obesity    PONV (postoperative nausea and vomiting)    after c section had vomit   Pregnancy induced hypertension    with 1st pregnancy, normal pressure with 2nd   Reflux    Thyroid disease    Vitamin B12 deficiency    Vitamin D deficiency disease    Wears dentures    full upper   BP (!) 143/84   Pulse 85   Temp 98.8 F (37.1 C) (Oral)   Ht 5\' 3"  (1.6 m)   Wt 231 lb 12.8 oz (105.1 kg)   SpO2 100%   BMI 41.06 kg/m   Opioid Risk Score:   Fall Risk Score:  `1  Depression screen PHQ 2/9  Depression screen [facility] 2/9 01/23/2021 12/26/2020 08/20/2020 07/17/2020 07/03/2020 06/10/2020 05/06/2020  Decreased Interest 2 0 2 2 2 2 1   Down, Depressed, Hopeless 1 0 2 1 2 1 1   PHQ - 2 Score 3 0 4 3 4 3 2   Altered sleeping 2 - 2 1 2 1 1   Tired, decreased energy 3 - 3 1 1 2 3   Change in appetite 0 - 0 0 2 3 2   Feeling bad or failure about yourself  3 - 1 1 1 1 1   Trouble concentrating 1 - 0 0 0 0 0  Moving slowly or fidgety/restless 0 - 0 0 0 0 0  Suicidal thoughts 0 - 1 0 0 0 0  PHQ-9 Score 12 - 11 6 10 10 9   Difficult doing work/chores Somewhat difficult - - - - Very difficult -  Some encounter information is confidential and restricted. Go to Review Flowsheets activity to see all data.  Some recent data might be hidden      Review of Systems  Musculoskeletal:        Leg pain  Psychiatric/Behavioral:  Positive for confusion and dysphoric mood. The patient is nervous/anxious.   All other systems reviewed and are negative.     Objective:   Physical Exam Gen: no distress, normal appearing HEENT: oral mucosa pink and  moist,  NCAT Cardio: Reg rate Chest: normal effort, normal rate of breathing Abd: soft, non-distended Ext: no edema Psych: pleasant, normal affect Skin: intact Neuro: Alert and oriented x3 Musculoskeletal:      Assessment & Plan:  Catherine Berg is a 40 year old woman who presents to establish care for bilateral lower extremity pain  Bilateral lower extremity neuropathy s/p LONG COVID syndrome -Discussed current symptoms of pain and history of pain.  -Discussed benefits of exercise in reducing pain. -Increase gabapentin to 300mg  TID -completing disability paperwork for her -Discussed following foods that may reduce pain: 1) Ginger (especially studied for arthritis)- reduce leukotriene production to decrease inflammation 2) Blueberries- high in phytonutrients that decrease inflammation 3) Salmon- marine omega-3s reduce joint swelling and pain 4) Pumpkin seeds- reduce inflammation 5) dark chocolate- reduces inflammation 6) turmeric- reduces inflammation 7) tart cherries - reduce pain and stiffness 8) extra virgin olive oil - its compound olecanthal helps to block prostaglandins  9) chili peppers- can be eaten or applied topically via capsaicin 10) mint- helpful for headache, muscle aches, joint pain, and itching 11) garlic- reduces inflammation  Link to further information on diet for chronic pain:    2) Expressive aphasia -very embarrassing to her -continue SLP HEP -provided referral to neuropsych

## 2021-01-24 ENCOUNTER — Encounter: Payer: Self-pay | Admitting: Family Medicine

## 2021-01-30 NOTE — Progress Notes (Signed)
test

## 2021-02-07 ENCOUNTER — Ambulatory Visit (INDEPENDENT_AMBULATORY_CARE_PROVIDER_SITE_OTHER): Payer: 59 | Admitting: Licensed Clinical Social Worker

## 2021-02-07 ENCOUNTER — Other Ambulatory Visit: Payer: Self-pay

## 2021-02-07 ENCOUNTER — Encounter: Payer: Self-pay | Admitting: Licensed Clinical Social Worker

## 2021-02-07 DIAGNOSIS — F330 Major depressive disorder, recurrent, mild: Secondary | ICD-10-CM | POA: Diagnosis not present

## 2021-02-07 DIAGNOSIS — F4310 Post-traumatic stress disorder, unspecified: Secondary | ICD-10-CM | POA: Diagnosis not present

## 2021-02-07 NOTE — Progress Notes (Signed)
Virtual Visit via Video Note  I connected with Catherine Berg on 02/07/21 at 10:00 AM EDT by a video enabled telemedicine application and verified that I am speaking with the correct person using two identifiers.  Location: Patient: home Provider: remote office [place], [place])   I discussed the limitations of evaluation and management by telemedicine and the availability of in person appointments. The patient expressed understanding and agreed to proceed.  I discussed the assessment and treatment plan with the patient. The patient was provided an opportunity to ask questions and all were answered. The patient agreed with the plan and demonstrated an understanding of the instructions.   The patient was advised to call back or seek an in-person evaluation if the symptoms worsen or if the condition fails to improve as anticipated.  I provided 45 minutes of non-face-to-face time during this encounter.   Catherine Heinlein R Ayrabella Labombard, LCSW   THERAPIST PROGRESS NOTE  Session Time: 10-1045a  Participation Level: Active  Behavioral Response: Neat and Well GroomedAlertAnxious  Type of Therapy: Individual Therapy  Treatment Goals addressed:  Goal: LTG: Reduce frequency, intensity, and duration of PTSD symptoms so daily functioning is improved: Input needed on appropriate metric.  Self report scale. Outcome: Progressing  Goal: STG: Practice interpersonal effectiveness skills 7 times per week for the next 16 weeks Outcome: Progressing  Goal: LTG: Reduce frequency, intensity, and duration of depression symptoms as evidenced by: SSB input needed on appropriate metric Outcome: Progressing  Goal: STG: [name] will participate in at least 80% of scheduled individual psychotherapy sessions Outcome: Progressing  Interventions:  Intervention: Review PLEASE Skills (Treat Physical Illness, Balance Eating, Avoid Mood-Altering Substances, Balance Sleep and Get Exercise) with  [name]  Intervention: Perform motivational interviewing regarding physical activity  Intervention: Educate patient on: Ways to improve sleep  Intervention: Provide grief and bereavement support  Summary: Catherine Berg is a 40 y.o. female who presents with improving symptoms related to PTSD diagnosis. Pt reports that she is experiencing "good days" and "bad days".  Pt reports that she is having issues sometimes with initiative and motivation.  Pt reporting fair quality and quantity of sleep.  Allowed pt to explore and express thoughts and feelings associated with recent life situations and external stressors. Pt still feels her "happy place" is shopping at [facility] pt to engage as frequently as possible. Discussed pts thoughts and feelings about health-related concerns--pt feels that she has long covid and that doctors/medical professionals don't believe her. Reviewed current coping mechanisms and discussed other ways/strategies of helping depression symptoms and anxiety/stressful moments.   Continued recommendations are as follows: self care behaviors, positive social engagements, focusing on overall work/home/life balance, and focusing on positive physical and emotional wellness. .   Suicidal/Homicidal: No  Therapist Response: Pt is continuing to apply interventions learned in session into daily life situations. Pt is currently on track to meet goals utilizing interventions mentioned above. Personal growth and progress noted. Treatment to continue as indicated.   Plan: Return again in 4 weeks.  Diagnosis: Axis I: PTSD    Axis II: No diagnosis    46 Catherine Pepitone, LCSW 02/07/2021

## 2021-02-10 NOTE — Plan of Care (Signed)
  Problem: PTSD-Trauma Disorder CCP Problem  1 Reduce the negative impact trauma related symptoms have on social, occupational, and family functioning. Goal: LTG: Reduce frequency, intensity, and duration of PTSD symptoms so daily functioning is improved: Input needed on appropriate metric.  Self report scale. Outcome: Progressing Goal: STG: Practice interpersonal effectiveness skills 7 times per week for the next 16 weeks Outcome: Progressing Intervention: Encourage [name] to track activities daily, utilizing an activity log, journal, or diary card, and review it during each individual session Note: Tracking tasks every day Intervention: Assist with relaxation techniques, as appropriate (deep breathing exercises, meditation, guided imagery) Intervention: Educate patient on: Stress management     Problem: Depression CCP Problem  1 Decrease depressive symptoms and improve levels of effective functioning Goal: LTG: Reduce frequency, intensity, and duration of depression symptoms as evidenced by: SSB input needed on appropriate metric Outcome: Progressing Goal: STG: [name] will participate in at least 80% of scheduled individual psychotherapy sessions Outcome: Progressing Intervention: Review PLEASE Skills (Treat Physical Illness, Balance Eating, Avoid Mood-Altering Substances, Balance Sleep and Get Exercise) with [name] Intervention: Perform motivational interviewing regarding physical activity Intervention: Educate patient on: Ways to improve sleep Intervention: Provide grief and bereavement support

## 2021-02-16 ENCOUNTER — Ambulatory Visit
Admission: EM | Admit: 2021-02-16 | Discharge: 2021-02-16 | Disposition: A | Discharge status: Home / Self Care | Payer: Managed Care, Other (non HMO) | Attending: Physician Assistant | Admitting: Physician Assistant

## 2021-02-16 ENCOUNTER — Other Ambulatory Visit: Payer: Self-pay

## 2021-02-16 DIAGNOSIS — M549 Dorsalgia, unspecified: Secondary | ICD-10-CM | POA: Diagnosis not present

## 2021-02-16 MED ORDER — KETOROLAC TROMETHAMINE 60 MG/2ML IM SOLN
60.0000 mg | Freq: Once | INTRAMUSCULAR | Status: AC
  Administered 2021-02-16: 60 mg via INTRAMUSCULAR

## 2021-02-16 MED ORDER — NAPROXEN 500 MG PO TABS: 500.0000 mg | ORAL_TABLET | Freq: Two times a day (BID) | ORAL | 0 refills | Status: AC | PRN

## 2021-02-16 MED ORDER — CYCLOBENZAPRINE HCL 10 MG PO TABS: 10.0000 mg | ORAL_TABLET | Freq: Three times a day (TID) | ORAL | 0 refills | Status: DC | PRN

## 2021-02-16 NOTE — ED Triage Notes (Signed)
Pt with 2 days of mid right sided back pain. Limited ROM and unable to sleep on her side. Bending makes it worse.

## 2021-02-16 NOTE — ED Provider Notes (Addendum)
[facility]    CSN: [id number] Arrival date & time: 02/16/21  0809      History   Chief Complaint Chief Complaint  Patient presents with   Back Pain    HPI Catherine Berg is a 40 y.o. female presenting for right upper back pain x2 days.  She denies any injury.  Pain is worse with "any movement."  She says it also increases pain to take a big breath.  She denies feeling short of breath or having any wheezing though.  No radiation of pain up or down her back.  No radiation of pain to neck or upper extremities and no associated numbness, weakness or tingling.  No chest pain.  Denies any recent illness.  No fever or cough.  Denies similar problem the past.  Has taken ibuprofen but says she does not think it really helped.  Does not report any history of PE or DVT.  No recent surgeries or travel reported.  No other complaints.  HPI  Past Medical History:  Diagnosis Date   Abnormal thyroid blood test    Anemia    Anxiety    Asthma    Complication of anesthesia    itching after c sections   COVID-19 01/2020   Depression    Elevated serum glutamic pyruvic transaminase (SGPT) level    Graves disease    History of abnormal cervical Pap smear    History of cervical polypectomy    Hives 09/02/2015   Hypertension    Hypothyroidism    IFG (impaired fasting glucose)    Incidental lung nodule, > 2m and < 887m11/28/2018   4 mm RLL lung nodule on chest CT Mar 31, 2017   Low serum vitamin D    Obesity    PONV (postoperative nausea and vomiting)    after c section had vomit   Pregnancy induced hypertension    with 1st pregnancy, normal pressure with 2nd   Reflux    Thyroid disease    Vitamin B12 deficiency    Vitamin D deficiency disease    Wears dentures    full upper    Patient Active Problem List   Diagnosis Date Noted   Chronic pain of both lower extremities 09/24/2020   Sleeps in sitting position due to orthopnea 07/02/2020   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   COVID-19 long hauler manifesting chronic dyspnea 07/02/2020   Vitamin D deficiency 04/22/2020   COVID-19 long hauler manifesting chronic fatigue 04/22/2020   History of COVID-19 02/28/2020   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   Memory loss 02/28/2020   Physical deconditioning 02/28/2020   Dyspnea on exertion 02/28/2020   Neck pain, bilateral 02/28/2020   Muscle spasms of neck 02/28/2020   Arthralgia of hand 01/24/2019   CRP elevated 01/24/2019   ESR raised 01/24/2019   Class 3 severe obesity due to excess calories without serious comorbidity in adult ([facility]/22/2020   Palpitations 09/16/2018   Coronary artery calcification 09/16/2018   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   Bilateral leg edema 04/20/2017   Incidental lung nodule, > 62m462mnd < 8mm26m/28/2018   Hives 09/02/2015   Morbid obesity (HCC)[place]/13/2017   Anxiety 07/09/2015   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 06/28/2015   Thyroid nodule 02/01/2015   Vitamin B12 deficiency    History of abnormal cervical Pap smear    Allergy-induced asthma, mild intermittent, uncomplicated 06/088/41/6606Past Surgical History:  Procedure Laterality Date   CERVICAL POLYPECTOMY  CESAREAN SECTION     x 2   COLONOSCOPY WITH PROPOFOL N/A 11/14/2018   Procedure: COLONOSCOPY WITH PROPOFOL;  Surgeon: Virgel Manifold, MD;  Location: [place];  Service: Endoscopy;  Laterality: N/A;   ESOPHAGOGASTRODUODENOSCOPY (EGD) WITH PROPOFOL N/A 11/14/2018   Procedure: ESOPHAGOGASTRODUODENOSCOPY (EGD) WITH PROPOFOL;  Surgeon: Virgel Manifold, MD;  Location: [place];  Service: Endoscopy;  Laterality: N/A;  Latex allergy   GIVENS CAPSULE STUDY N/A 12/27/2018   Procedure: GIVENS CAPSULE STUDY;  Surgeon: Virgel Manifold, MD;  Location: [facility];  Service: Endoscopy;  Laterality: N/A;    OB History     Gravida  4   Para  2   Term  1   Preterm  1   AB      Living  2      SAB      IAB       Ectopic      Multiple      Live Births           Obstetric Comments  1st Menstrual Cycle:  10 1st Pregnancy:  23          Home Medications    Prior to Admission medications   Medication Sig Start Date End Date Taking? Authorizing Provider  cyclobenzaprine (FLEXERIL) 10 MG tablet Take 1 tablet (10 mg total) by mouth 3 (three) times daily as needed for muscle spasms. 02/16/21  Yes Laurene Footman B, PA-C  naproxen (NAPROSYN) 500 MG tablet Take 1 tablet (500 mg total) by mouth 2 (two) times daily as needed for up to 10 days. 02/16/21 02/26/21 Yes Laurene Footman B, PA-C  albuterol (PROVENTIL HFA;VENTOLIN HFA) 108 (90 Base) MCG/ACT inhaler Inhale 2 puffs into the lungs every 6 (six) hours as needed for wheezing or shortness of breath. 06/28/18   Laverle Hobby, MD  azelastine (ASTELIN) 0.1 % nasal spray Place 2 sprays into both nostrils 2 (two) times daily. Use in each nostril as directed 12/26/20   Steele Sizer, MD  buPROPion (WELLBUTRIN XL) 150 MG 24 hr tablet Total of 450 mg daily. Take along with 300 mg tab 01/09/21   Norman Clay, MD  buPROPion (WELLBUTRIN XL) 300 MG 24 hr tablet Take 1 tablet (300 mg total) by mouth daily. Take along with 150 mg tab, total of 450 mg daily. 12/27/20 06/25/21  Norman Clay, MD  celecoxib (CELEBREX) 200 MG capsule Take 1 capsule (200 mg total) by mouth daily after lunch. 07/17/20   Steele Sizer, MD  clobetasol (TEMOVATE) 0.05 % external solution Apply 1 application topically 2 (two) times daily. 10/31/19   Steele Sizer, MD  DULoxetine (CYMBALTA) 30 MG capsule 90 mg daily. Take along with 60 mg cap 11/21/20   Hisada, Elie Goody, MD  DULoxetine (CYMBALTA) 60 MG capsule 90 mg daily. Take along with 30 mg cap 01/17/21   Norman Clay, MD  Elastic Bandages & Supports (MEDICAL COMPRESSION STOCKINGS) MISC Wear from morning to night, but do not sleep in these 04/20/17   Lada, Satira Anis, MD  etonogestrel (NEXPLANON) 68 MG IMPL implant Inject 68 mg into the skin  once.    [provider]  gabapentin (NEURONTIN) 300 MG capsule Take 1 capsule (300 mg total) by mouth 3 (three) times daily. 01/23/21   Raulkar, Clide Deutscher, MD  hydrOXYzine (ATARAX/VISTARIL) 10 MG tablet Take 1 tablet (10 mg total) by mouth 3 (three) times daily as needed. 06/27/20   Fenton Foy, NP  levothyroxine (SYNTHROID) 75 MCG  tablet Take 75 mcg by mouth daily before breakfast. 11/13/18   Lonia Farber, MD  mometasone-formoterol (DULERA) 200-5 MCG/ACT AERO Inhale 2 puffs into the lungs in the morning and at bedtime. 09/09/20   Flora Lipps, MD  montelukast (SINGULAIR) 10 MG tablet Take 1 tablet (10 mg total) by mouth at bedtime. 06/10/20   Steele Sizer, MD  pregabalin (LYRICA) 100 MG capsule Take 1 capsule (100 mg total) by mouth at bedtime as needed. 01/13/21   Steele Sizer, MD  traZODone (DESYREL) 50 MG tablet Take 0.5-1 tablets (25-50 mg total) by mouth at bedtime as needed for sleep. 01/13/21   Steele Sizer, MD  Vitamin D, Ergocalciferol, (DRISDOL) 1.25 MG (50000 UNIT) CAPS capsule Take 1 capsule (50,000 Units total) by mouth every 7 (seven) days. 07/17/20   Steele Sizer, MD    Family History Family History  Problem Relation Age of Onset   Heart attack Mother 64   Hypertension Mother    Asthma Mother    Cancer Mother        laryngeal   Diabetes Mother        pre diabetic   Heart disease Mother    Mental illness Mother    Alcohol abuse Mother    Drug abuse Mother    Depression Mother    Anxiety disorder Mother    Bipolar disorder Mother    Learning disabilities Brother    ADD / ADHD Brother    Asthma Son    Hyperlipidemia Brother    Alcohol abuse Father    Heart disease Maternal Grandmother        heart attack, pacemaker   Heart attack Maternal Grandmother    Hypertension Maternal Grandmother    Hypertension Maternal Grandfather    Heart disease Paternal Grandmother        couple of major open heart surgeries, leaking valves   Hypertension  Paternal Grandmother    Hypertension Paternal Grandfather    Breast cancer Neg Hx     Social History Social History   Tobacco Use   Smoking status: Never   Smokeless tobacco: Never  Vaping Use   Vaping Use: Never used  Substance Use Topics   Alcohol use: No    Alcohol/week: 0.0 standard drinks   Drug use: No     Allergies   Latex and Shellfish allergy   Review of Systems Review of Systems  Constitutional:  Negative for fatigue and fever.  HENT:  Negative for congestion.   Respiratory:  Negative for cough, chest tightness and shortness of breath.   Cardiovascular:  Negative for chest pain.  Gastrointestinal:  Negative for abdominal pain.  Genitourinary:  Negative for dysuria, flank pain and hematuria.  Musculoskeletal:  Positive for back pain. Negative for gait problem.  Skin:  Negative for wound.  Neurological:  Negative for weakness and numbness.    Physical Exam Triage Vital Signs ED Triage Vitals  Enc Vitals Group     BP 02/16/21 0821 138/87     Pulse Rate 02/16/21 0821 90     Resp 02/16/21 0821 17     Temp 02/16/21 0821 98.7 F (37.1 C)     Temp Source 02/16/21 0821 Oral     SpO2 02/16/21 0821 99 %     Weight 02/16/21 0818 237 lb (107.5 kg)     Height 02/16/21 0818 '5\' 3"'  (1.6 m)     Head Circumference --      Peak Flow --      Pain  Score 02/16/21 0817 9     Pain Loc --      Pain Edu? --      Excl. in [place]? --    No data found.  Updated Vital Signs BP 138/87 (BP Location: Left Arm)   Pulse 90   Temp 98.7 F (37.1 C) (Oral)   Resp 17   Ht '5\' 3"'  (1.6 m)   Wt 237 lb (107.5 kg)   SpO2 99%   BMI 41.98 kg/m     Physical Exam Vitals and nursing note reviewed.  Constitutional:      General: She is not in acute distress.    Appearance: Normal appearance. She is not ill-appearing or toxic-appearing.  HENT:     Head: Normocephalic and atraumatic.  Eyes:     General: No scleral icterus.       Right eye: No discharge.        Left eye: No  discharge.     Conjunctiva/sclera: Conjunctivae normal.  Cardiovascular:     Rate and Rhythm: Normal rate and regular rhythm.     Heart sounds: Normal heart sounds.  Pulmonary:     Effort: Pulmonary effort is normal. No respiratory distress.     Breath sounds: Normal breath sounds.  Musculoskeletal:     Cervical back: Neck supple.     Thoracic back: Tenderness (As above, TTP right parathoracic/scapular muscles) present. No bony tenderness. Decreased range of motion.       Back:  Skin:    General: Skin is dry.  Neurological:     General: No focal deficit present.     Mental Status: She is alert. Mental status is at baseline.     Motor: No weakness.     Gait: Gait normal.  Psychiatric:        Mood and Affect: Mood normal.        Behavior: Behavior normal.        Thought Content: Thought content normal.     UC Treatments / Results  Labs (all labs ordered are listed, but only abnormal results are displayed) Labs Reviewed - No data to display  EKG   Radiology No results found.  Procedures Procedures (including critical care time)  Medications Ordered in UC Medications  ketorolac (TORADOL) injection 60 mg (60 mg Intramuscular Given 02/16/21 0853)    Initial Impression / Assessment and Plan / UC Course  I have reviewed the triage vital signs and the nursing notes.  Pertinent labs & imaging results that were available during my care of the patient were reviewed by me and considered in my medical decision making (see chart for details).  40 year old female presenting for right upper back pain for the past 2 days.  No injury reported.  No red flag signs or symptoms elicited on history and physical.  She does have tenderness palpation of the right thoracic para spinal muscles and posterior scapular muscles.  Chest is clear to auscultation heart regular rate and rhythm.  Patient has not had anything for pain relief today.  Patient given 60 mg IM ketorolac for pain relief in  clinic.  Suspect strain of muscle of upper back and/or muscle spasm.  Treating at this time with naproxen and Flexeril.  Advised heat and Tylenol as well.  Consider use of muscle rubs.  Reviewed return and ED precautions.  Final Clinical Impressions(s) / UC Diagnoses   Final diagnoses:  Upper back pain on right side     Discharge Instructions  BACK PAIN: Stressed avoiding painful activities . RICE (REST, ICE, COMPRESSION, ELEVATION) guidelines reviewed. May alternate ice and heat. Consider use of muscle rubs, Salonpas patches, etc. Use medications as directed including muscle relaxers if prescribed. Take anti-inflammatory medications as prescribed or OTC NSAIDs/Tylenol.  F/u with PCP in 7-10 days for reexamination, and please feel free to call or return to the urgent care at any time for any questions or concerns you may have and we will be happy to help you!   BACK PAIN RED FLAGS: If the back pain acutely worsens or there are any red flag symptoms such as numbness/tingling, leg weakness, saddle anesthesia, or loss of bowel/bladder control, go immediately to the ER. Follow up with [place] as scheduled or sooner if the pain does not begin to resolve or if it worsens before the follow up       ED Prescriptions     Medication Sig Dispense Auth. Provider   cyclobenzaprine (FLEXERIL) 10 MG tablet Take 1 tablet (10 mg total) by mouth 3 (three) times daily as needed for muscle spasms. 15 tablet Laurene Footman B, PA-C   naproxen (NAPROSYN) 500 MG tablet Take 1 tablet (500 mg total) by mouth 2 (two) times daily as needed for up to 10 days. 20 tablet Danton Clap, PA-C      I have reviewed the PDMP during this encounter.   Danton Clap, PA-C 02/16/21 0904    Danton Clap, PA-C 02/16/21 [phone]

## 2021-02-16 NOTE — Discharge Instructions (Addendum)
BACK PAIN: Stressed avoiding painful activities . RICE (REST, ICE, COMPRESSION, ELEVATION) guidelines reviewed. May alternate ice and heat. Consider use of muscle rubs, Salonpas patches, etc. Use medications as directed including muscle relaxers if prescribed. Take anti-inflammatory medications as prescribed or OTC NSAIDs/Tylenol.  F/u with PCP in 7-10 days for reexamination, and please feel free to call or return to the urgent care at any time for any questions or concerns you may have and we will be happy to help you!   BACK PAIN RED FLAGS: If the back pain acutely worsens or there are any red flag symptoms such as numbness/tingling, leg weakness, saddle anesthesia, or loss of bowel/bladder control, go immediately to the ER. Follow up with us as scheduled or sooner if the pain does not begin to resolve or if it worsens before the follow up   

## 2021-02-20 NOTE — Telephone Encounter (Signed)
Rec'd request [employer] to complete the same disability form.  I attempted to call [employer] and was not successful, the number provided was for claimants only and not for medical providers.  I faxed the form signed by Dr. Belia Heman on 9/21 with a note that this was previously faxed on 9/22.  The form Dr. Belia Heman signed stated the patient could return to work if accommodations were made for her condition.  I faxed the 9/21 form again to [facility] fax# [phone].
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
## 2021-02-24 ENCOUNTER — Telehealth (INDEPENDENT_AMBULATORY_CARE_PROVIDER_SITE_OTHER): Payer: 59 | Admitting: Psychiatry

## 2021-02-24 ENCOUNTER — Other Ambulatory Visit: Payer: Self-pay

## 2021-02-24 ENCOUNTER — Encounter: Payer: Self-pay | Admitting: Psychiatry

## 2021-02-24 DIAGNOSIS — F411 Generalized anxiety disorder: Secondary | ICD-10-CM | POA: Diagnosis not present

## 2021-02-24 DIAGNOSIS — F4310 Post-traumatic stress disorder, unspecified: Secondary | ICD-10-CM

## 2021-02-24 DIAGNOSIS — F330 Major depressive disorder, recurrent, mild: Secondary | ICD-10-CM | POA: Diagnosis not present

## 2021-02-24 NOTE — Patient Instructions (Signed)
Continue duloxetine 90 mg daily Continue bupropion 450 mg daily Start quetiapine 25 mg at night Next appointment: 12/1 at 3 PM

## 2021-03-05 ENCOUNTER — Encounter: Payer: Managed Care, Other (non HMO) | Admitting: Psychology

## 2021-03-05 NOTE — Telephone Encounter (Signed)
We rec'd another faxed request from [employer] for signed disability form and medical records.  I called the [place] office and spoke to [facility] S.  She could not find the faxes from 9/22 or 10/20.  At her request I have emailed her the signed form and office notes from May 2020 to Aug 2022.  Email: [email or web address]  Attn:  Mus[place] S.

## 2021-03-16 ENCOUNTER — Other Ambulatory Visit: Payer: Self-pay | Admitting: Family Medicine

## 2021-03-16 DIAGNOSIS — R0981 Nasal congestion: Secondary | ICD-10-CM

## 2021-03-18 ENCOUNTER — Encounter: Payer: Self-pay | Admitting: Psychology

## 2021-03-18 ENCOUNTER — Other Ambulatory Visit: Payer: Self-pay

## 2021-03-18 ENCOUNTER — Encounter: Payer: Self-pay | Attending: Physical Medicine and Rehabilitation | Admitting: Psychology

## 2021-03-18 DIAGNOSIS — G8929 Other chronic pain: Secondary | ICD-10-CM | POA: Insufficient documentation

## 2021-03-18 DIAGNOSIS — R413 Other amnesia: Secondary | ICD-10-CM | POA: Insufficient documentation

## 2021-03-18 DIAGNOSIS — U099 Post covid-19 condition, unspecified: Secondary | ICD-10-CM | POA: Insufficient documentation

## 2021-03-18 DIAGNOSIS — F419 Anxiety disorder, unspecified: Secondary | ICD-10-CM | POA: Insufficient documentation

## 2021-03-18 DIAGNOSIS — M79604 Pain in right leg: Secondary | ICD-10-CM | POA: Insufficient documentation

## 2021-03-18 DIAGNOSIS — G9332 Myalgic encephalomyelitis/chronic fatigue syndrome: Secondary | ICD-10-CM | POA: Insufficient documentation

## 2021-03-18 DIAGNOSIS — M79605 Pain in left leg: Secondary | ICD-10-CM | POA: Insufficient documentation

## 2021-03-18 NOTE — Progress Notes (Signed)
Neuropsychological Consultation   Patient:   Catherine Berg   DOB:   05/23/1980  MR Number:  [id number]  Location:  Catherine Berg Berg, Catherine [id number] [facility]  [place] [address] Dept: [phone]           Date of Service:   03/18/2021  Start Time:   3 PM End Time:   5 PM  Today's visit was an in person visit that was conducted in my outpatient clinic on patient myself present.  1 hour 15 minutes was spent in face-to-face clinical interview and the other 45 minutes was spent with records review and report writing.  Provider/Observer:  Catherine Berg, Psy.D.       Clinical Neuropsychologist       Billing Code/Service: 96116/96121  Chief Complaint:    Catherine Berg is a 40 year old female who is referred for neuropsychological evaluation by her treating physical medicine physician Catherine Human, MD. the patient is also been followed by psychiatry with Catherine Berg for depression as well.  The patient has been dealing with long COVID-like symptoms including memory deficits, fatigue, significant pain in bilateral lower extremities.  The patient also has increasing depression and anxiety symptomatology but had been having some depression and bereavement issues prior to the development of significant COVID.  Reason for Service:  Catherine Berg is a 40 year old female who is referred for neuropsychological evaluation by her treating physical medicine physician Catherine Human, MD. the patient is also been followed by psychiatry with Catherine Berg for depression as well.  The patient has been dealing with long COVID-like symptoms including memory deficits, fatigue, significant pain in bilateral lower extremities.  The patient also has increasing depression and anxiety symptomatology but had been having some depression and bereavement issues prior to the development of significant  COVID.  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  The patient developed COVID on September 2021 and is continued to struggle with post viral fatigue, change in sense of smell, cognitive decline, lower pain/neuropathy, joint aches, post exercise fatigue, shortness of breath with activity.  She is also showing PTSD-like symptoms and a worsening of her depression that predated her COVID.  The patient reports that her sense of smell has improved but continues to be about 75% estimated by the patient as far as functioning.  The patient reports that she did not have significant illness until her diagnosis in September 2021.  The patient reports that she feels stuck in life and was very capable with a good memory prior to Catherine Berg.  The patient reports that she feels like she lived a decent life.  Patient reports that she was very independent and never needed to take notes or any type of memory aids.  Patient reports that she feels like she remembered everything and now there is some days she goes to places and gets "stuck" and asked trouble knowing what to do when she is there why she is there.  The patient reports that she is having difficulty remembering to pay bills and keeping up with other paperwork.  She reports that she will repetitively go through papers trying to figure things out.  The  patient reports that she will intellectually know how to do something but just cannot get it completed and know exactly what to do.  She reports that while she knows what to do she cannot follow through on the specifics or how to execute.  The patient reports an example such as cooking dinner.  She  reports that she will cook a recipe and have it done but it would not come out right she would not be able to figure out what went wrong.  She describes her memory difficulties as having difficulty remembering her retrieving information and she will know something is happened but cannot retrieve the details.  She has been asking others to write information down so she can read it and having a hard copy or reminders as needed.  She reports that she forgets what time to take her medicines and has a very difficult time keeping up with these aspects versus past.  Patient describes other difficulties including problem solving and executive function issues but not sure how much her memory difficulties impact this.  The patient reports that she has trouble talking to people on the phone or in person and does not want to have to explain what her difficulties are and tends to avoid being around others.  She reports that she spends about 90% of her day isolated.  The patient reports that sometimes her depression is very significant and she feels like she is getting into "a dark space."  The patient reports that she has times where she feels overwhelmed thinking that she is never going to "feel normal again."  She reports that everything feels different than in the past.  The patient reports that the experiences around [place] have lingered psychologically.  The patient reports that when she got COVID she was very sick and had to be isolated for 14 days.  She never developed COVID-pneumonia though.  The patient reports that being isolated for this extended period of time really "did something to me."  She reports that she struggled being away from others.  The patient reports that her mother passed away shortly before she became sick and when she was isolated she became overwhelmed with this darkness and loss of her mother.  She reports that she is struggled with feeling isolated and alone and unable to function  properly.  The patient reports that she sleeps about 5 or 6 each night.  She was assessed for sleep apnea with Dr. Brett Berg and was not found to have sleep apnea.  The patient reports that she tends to sleep in a recliner chair semiupright at night.  The patient reports that there is a change in her appetite and she mostly just eats at dinnertime.  The patient reports that the taste of food is different and that her smell has never fully returned.  She reports that she has to rely more on her children to help her out and that she has not been able to work anymore.  Current psychotropic medications include Wellbutrin and Cymbalta and that they are trying Cymbalta and now addition of gabapentin for her depression as well as her lower leg pain.  The patient has had no brain imaging studies today.  Behavioral Observation: Catherine Berg  presents as a 40 y.o.-year-old Right handed African American Female who appeared her stated age. her dress was Appropriate and she was Well Groomed and her manners were Appropriate to the situation.  her participation was indicative of Appropriate behaviors.  There were  not physical disabilities noted.  she displayed an appropriate level of cooperation and motivation.     Interactions:    Active Appropriate and Redirectable  Attention:   abnormal and attention span appeared shorter than expected for age  Memory:   abnormal; remote memory intact, recent memory impaired  Visuo-spatial:  not examined  Speech (Volume):  low  Speech:   normal; normal  Thought Process:  Coherent and Relevant  Though Content:  WNL; not suicidal and not homicidal  Orientation:   person, place, time/date, and situation  Judgment:   Good  Planning:   Poor  Affect:    Anxious and Depressed  Mood:    Dysphoric  Insight:   Good  Intelligence:   normal  Marital Status/Living: The patient was born and raised in [facility] along with 2 siblings.  She was born premature.  The  patient currently lives with her 2 children and 1 .  The patient is single.  The patient has a 41 year old and a 51 year old and her son has been diagnosed with attention deficit disorder.  Current Employment: The patient is currently unable to work.  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  Substance Use:  No concerns of substance abuse are reported.  Education:   The patient completed the 11th grade of school with a 2.0 GPA average from [employer].  The patient reports that she always did very well in reading and reading comprehension but did have some difficulty with math.  Medical History:   Past Medical History:  Diagnosis Date   Abnormal thyroid blood test    Anemia    Anxiety    Asthma    Complication of anesthesia    itching after c sections   COVID-19 01/2020   Depression    Elevated serum glutamic pyruvic transaminase (SGPT) level    Graves disease    History of abnormal cervical Pap smear    History of cervical polypectomy    Hives 09/02/2015   Hypertension    Hypothyroidism    IFG (impaired fasting glucose)    Incidental lung nodule, > 29m and < 825m11/28/2018   4 mm RLL lung nodule on chest CT Mar 31, 2017   Low serum vitamin D    Obesity    PONV (postoperative nausea and vomiting)    after c section had vomit   Pregnancy induced hypertension    with 1st pregnancy, normal pressure with 2nd   Reflux    Thyroid disease    Vitamin B12 deficiency    Vitamin D deficiency disease    Wears dentures    full upper         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   COVID-19 long hauler manifesting chronic dyspnea 07/02/2020   Vitamin D deficiency 04/22/2020   COVID-19 long hauler  manifesting chronic fatigue 04/22/2020   History of COVID-19 02/28/2020   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   Memory loss 02/28/2020   Physical deconditioning 02/28/2020   Dyspnea on exertion 02/28/2020   Neck pain, bilateral 02/28/2020   Muscle spasms of neck 02/28/2020   Arthralgia of hand 01/24/2019   CRP elevated 01/24/2019   ESR raised 01/24/2019   Class  3 severe obesity due to excess calories without serious comorbidity in adult [facility]) 01/24/2019   Palpitations 09/16/2018   Coronary artery calcification 09/16/2018   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  This happened shortly before she developed COVID.  The patient describes her experience particularly with the isolation that she had to have for 14 days is very problematic and she continues to have nightmares and flashbacks of this isolation.  Even though the the patient has been avoiding talking to others outside of her family she is always been a gregarious person that enjoyed interacting with people.  Psychiatric History:  The patient likely had a history of depression or at least bereavement prior to her COVID illness.  This has become significantly exacerbated with her ongoing medical and cognitive issues post COVID.  Family Med/Psych History:  Family History  Problem Relation Age of Onset   Heart attack Mother 4772 Hypertension Mother    Asthma Mother    Cancer Mother        laryngeal    Diabetes Mother        pre diabetic   Heart disease Mother    Mental illness Mother    Alcohol abuse Mother    Drug abuse Mother    Depression Mother    Anxiety disorder Mother    Bipolar disorder Mother    Learning disabilities Brother    ADD / ADHD Brother    Asthma Son    Hyperlipidemia Brother    Alcohol abuse Father    Heart disease Maternal Grandmother        heart attack, pacemaker   Heart attack Maternal Grandmother    Hypertension Maternal Grandmother    Hypertension Maternal Grandfather    Heart disease Paternal Grandmother        couple of major open heart surgeries, leaking valves   Hypertension Paternal Grandmother    Hypertension Paternal Grandfather    Breast cancer Neg Hx     Risk of Suicide/Violence: low patient denies any suicidal or homicidal ideation.  Impression/DX:  Catherine Berg a 89ear old female who is referred for neuropsychological evaluation by her treating physical medicine physician KrOsvaldo HumanMD. the patient is also been followed by psychiatry with Dr. HiModesta Messingor depression as well.  The patient has been dealing with long COVID-like symptoms including memory deficits, fatigue, significant pain in bilateral lower extremities.  The patient also has increasing depression and anxiety symptomatology but had been having some depression and bereavement issues prior to the development of significant COVID.  Disposition/Plan:  We have set the patient up for formal neuropsychological testing.  The patient will complete a structured battery including the Wechsler Adult Intelligence Scale's, the Wechsler Memory Scale's as well as the comprehensive attention battery and CPT.  We will assess memory functions, a wide range of cognitive domains as well as attention and executive functioning.  Once this assessment is  completed a formal report will be made with recommendations and diagnostic considerations as well as coordinating with her treating physicians as  far as care and ongoing treatment.  Diagnosis:    Memory loss  Chronic pain of both lower extremities  COVID-19 long hauler manifesting chronic fatigue  Anxiety         Electronically Signed   _______________________ Catherine Berg, Psy.D. Clinical Neuropsychologist

## 2021-03-24 ENCOUNTER — Other Ambulatory Visit: Payer: Self-pay

## 2021-03-24 ENCOUNTER — Ambulatory Visit (INDEPENDENT_AMBULATORY_CARE_PROVIDER_SITE_OTHER): Payer: 59 | Admitting: Licensed Clinical Social Worker

## 2021-03-24 DIAGNOSIS — F4310 Post-traumatic stress disorder, unspecified: Secondary | ICD-10-CM

## 2021-03-24 NOTE — Progress Notes (Signed)
Virtual Visit via Video Note  I connected with Catherine Berg on 03/24/21 at 11:00 AM EST by a video enabled telemedicine application and verified that I am speaking with the correct person using two identifiers.  Location: Patient: home Provider: remote office [place], [place])   I discussed the limitations of evaluation and management by telemedicine and the availability of in person appointments. The patient expressed understanding and agreed to proceed.  I discussed the assessment and treatment plan with the patient. The patient was provided an opportunity to ask questions and all were answered. The patient agreed with the plan and demonstrated an understanding of the instructions.   The patient was advised to call back or seek an in-person evaluation if the symptoms worsen or if the condition fails to improve as anticipated.  I provided 55 minutes of non-face-to-face time during this encounter.   Khaila Velarde R Myleigh Amara, LCSW   THERAPIST PROGRESS NOTE  Session Time: 11-11:55a  Participation Level: Active  Behavioral Response: NeatAlertAnxious and Depressed  Type of Therapy: Individual Therapy  Treatment Goals addressed: Problem: PTSD-Trauma Disorder CCP Problem  1 Reduce the negative impact trauma related symptoms have on social, occupational, and family functioning. Goal: LTG: Reduce frequency, intensity, and duration of PTSD symptoms so daily functioning is improved: Input needed on appropriate metric.  Self report scale. Outcome: Progressing Goal: STG: Practice interpersonal effectiveness skills 7 times per week for the next 16 weeks Outcome: Progressing   Problem: Depression CCP Problem  1 Decrease depressive symptoms and improve levels of effective functioning Goal: LTG: Reduce frequency, intensity, and duration of depression symptoms as evidenced by: SSB input needed on appropriate metric Outcome: Progressing Goal: STG: [name] will participate in at least 80% of  scheduled individual psychotherapy sessions Outcome: Progressing  Interventions: Intervention: Work with [name] to identify the major components of a recent episode of depression: physical symptoms, major thoughts and images, and major behaviors they experienced  Summary: Catherine Berg is a 40 y.o. female who presents with stabilizing symptoms related to PTSD diagnosis. Pt reports that overall mood has been stable and that she is managing situational anxiety and triggers well. Pt reports good quality and quantity of sleep.  Allowed pt to explore and express thoughts and feelings associated with recent life situations and external stressors. Discussed pt wanting to improve overall time management, motivation, and initiative involving large tasks that need to be done. Discussed time management strategies and using logic/reason to plan out "to do" lists. Pt states that she recently joined a school/parent booster's club which allows her space to socially engage with others. Pt  reports that health-wise she feels validated by a neurologist that made her feel "heard". Pt states that this is helping her "not feel crazy" because this is how other medical providers have made her feel in the past. Discussed pt often choosing the avoidance response versus setting boundaries with others--reviewed strategies to set boundaries with others.  Continued recommendations are as follows: self care behaviors, positive social engagements, focusing on overall work/home/life balance, and focusing on positive physical and emotional wellness. .   Suicidal/Homicidal: No  Therapist Response: Pt is continuing to apply interventions learned in session into daily life situations. Pt is currently on track to meet goals utilizing interventions mentioned above. Personal growth and progress noted. Treatment to continue as indicated.   Plan: Return again in 4 weeks.  Diagnosis: Axis I: PTSD    Axis II: No  diagnosis    Catherine Berg Mailee Klaas, LCSW 03/24/2021

## 2021-03-24 NOTE — Plan of Care (Signed)
  Problem: PTSD-Trauma Disorder CCP Problem  1 Reduce the negative impact trauma related symptoms have on social, occupational, and family functioning. Goal: LTG: Reduce frequency, intensity, and duration of PTSD symptoms so daily functioning is improved: Input needed on appropriate metric.  Self report scale. Outcome: Progressing Goal: STG: Practice interpersonal effectiveness skills 7 times per week for the next 16 weeks Outcome: Progressing   Problem: Depression CCP Problem  1 Decrease depressive symptoms and improve levels of effective functioning Goal: LTG: Reduce frequency, intensity, and duration of depression symptoms as evidenced by: SSB input needed on appropriate metric Outcome: Progressing Goal: STG: [name] will participate in at least 80% of scheduled individual psychotherapy sessions Outcome: Progressing Intervention: Work with [name] to identify the major components of a recent episode of depression: physical symptoms, major thoughts and images, and major behaviors they experienced

## 2021-03-26 ENCOUNTER — Other Ambulatory Visit: Payer: Self-pay

## 2021-03-31 ENCOUNTER — Ambulatory Visit: Payer: Managed Care, Other (non HMO)
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
## 2021-04-03 ENCOUNTER — Other Ambulatory Visit: Payer: Self-pay

## 2021-04-03 ENCOUNTER — Encounter: Payer: Self-pay | Admitting: Psychiatry

## 2021-04-03 ENCOUNTER — Telehealth (INDEPENDENT_AMBULATORY_CARE_PROVIDER_SITE_OTHER): Payer: Self-pay | Admitting: Psychiatry

## 2021-04-03 DIAGNOSIS — F411 Generalized anxiety disorder: Secondary | ICD-10-CM

## 2021-04-03 DIAGNOSIS — F330 Major depressive disorder, recurrent, mild: Secondary | ICD-10-CM

## 2021-04-03 DIAGNOSIS — F4310 Post-traumatic stress disorder, unspecified: Secondary | ICD-10-CM

## 2021-04-03 MED ORDER — DULOXETINE HCL 30 MG PO CPEP: 90.0000 mg | ORAL_CAPSULE | Freq: Every day | ORAL | 0 refills | Status: DC

## 2021-04-03 MED ORDER — QUETIAPINE FUMARATE 25 MG PO TABS: 25.0000 mg | ORAL_TABLET | Freq: Every day | ORAL | 1 refills | Status: DC

## 2021-04-03 NOTE — Patient Instructions (Signed)
Continue duloxetine 90 mg daily Continue bupropion 450 mg daily Start quetiapine 25 mg at night Next appointment: 1/9 at 4 PM, video

## 2021-04-15 ENCOUNTER — Encounter: Payer: Self-pay | Admitting: Psychology

## 2021-04-16 ENCOUNTER — Encounter: Payer: Self-pay | Admitting: Family Medicine

## 2021-04-21 ENCOUNTER — Ambulatory Visit: Payer: Managed Care, Other (non HMO) | Admitting: Psychology

## 2021-04-29 ENCOUNTER — Encounter: Payer: Self-pay | Admitting: Physical Medicine and Rehabilitation

## 2021-04-29 IMAGING — CT CT CHEST WITHOUT CONTRAST
2 of 3 series · 15 of 36 positions shown, 18 images · non-contrast
Comparison: 03/31/2017

CLINICAL DATA: Follow-up lung nodule

EXAM:
CT CHEST WITHOUT CONTRAST
TECHNIQUE: Multidetector CT imaging of the chest was performed following the
standard protocol without IV contrast.

[Series 2: thorax · axial · 0.60mm/px · z∈[+649,+903]mm · 12 of 151 slices shown, 15 images]
[im 12/151  mediastinal]
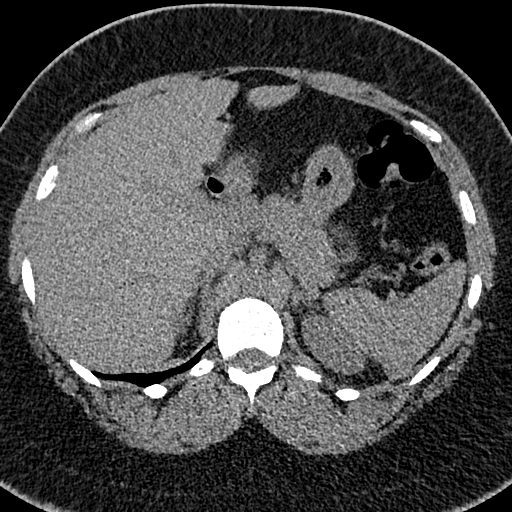
[im 12/151  lung]
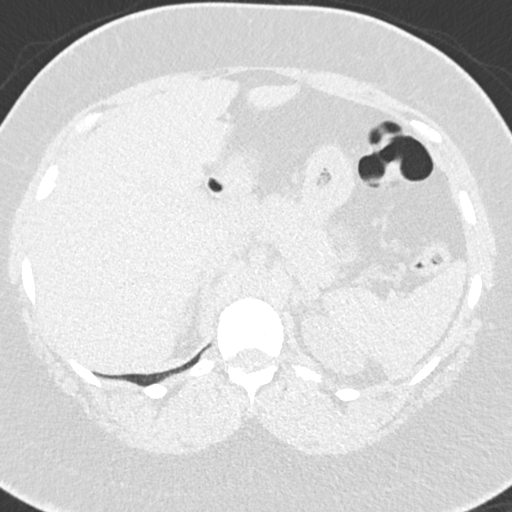
[im 23/151  lung]
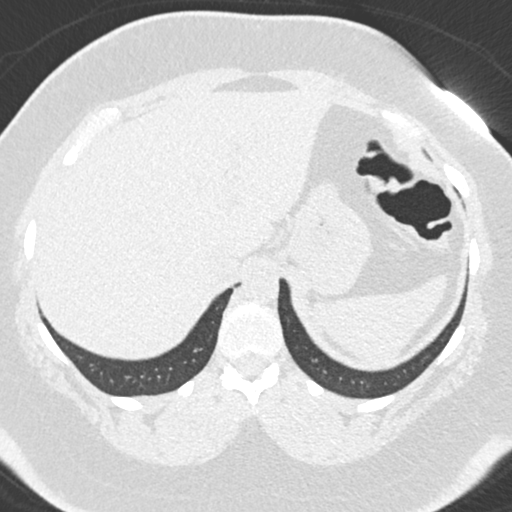
[im 34/151  lung]
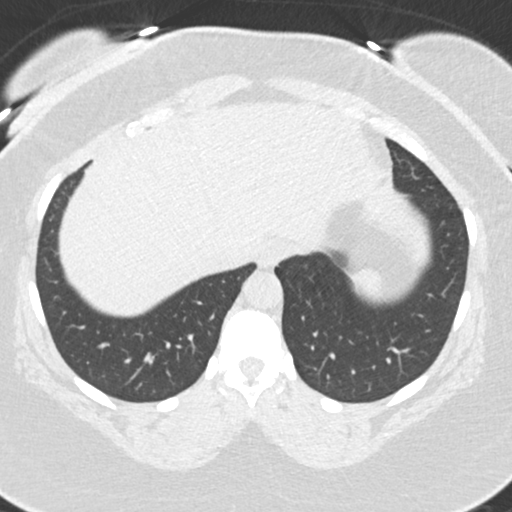
[im 45/151  lung]
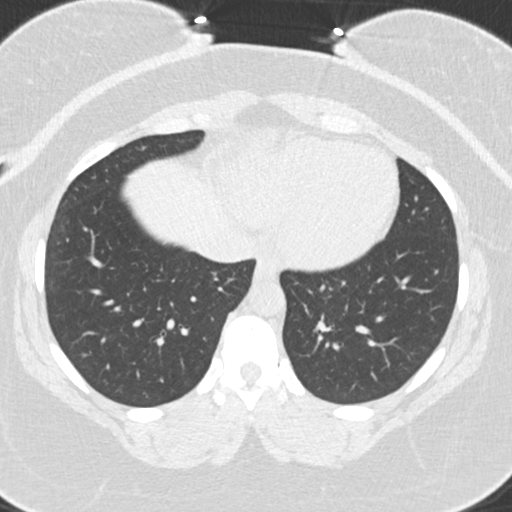
[im 56/151  mediastinal]
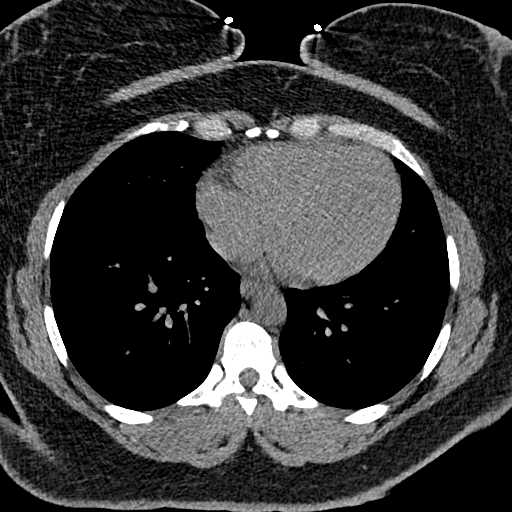
[im 56/151  lung]
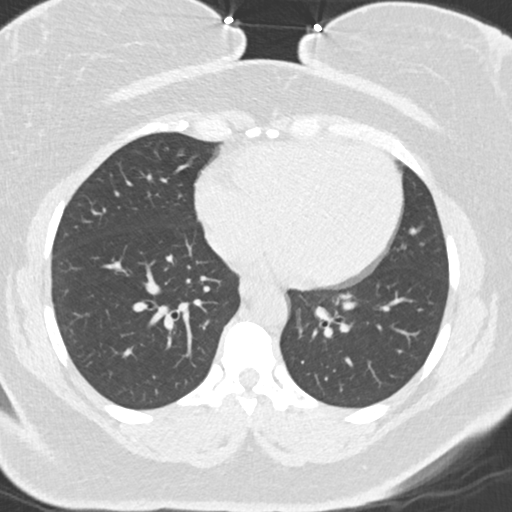
[im 67/151  lung]
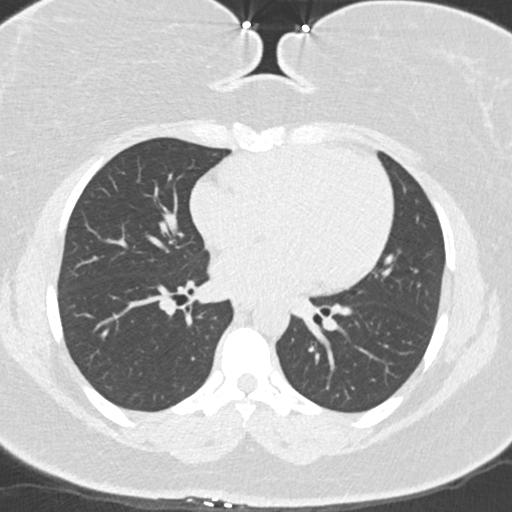
[im 84/151  lung]
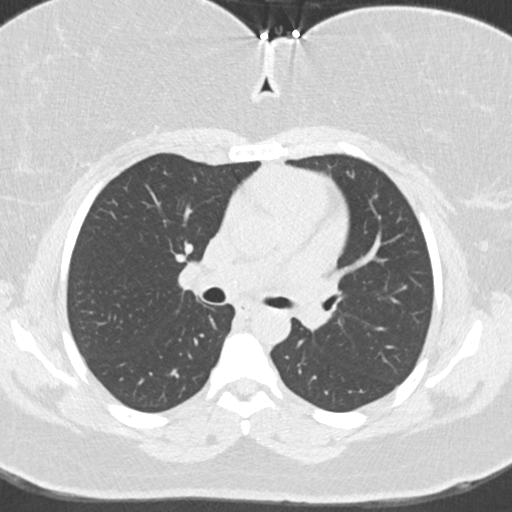
[im 95/151  lung]
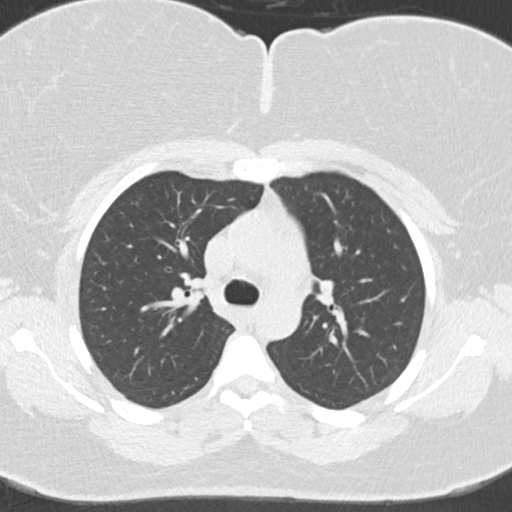
[im 106/151  mediastinal]
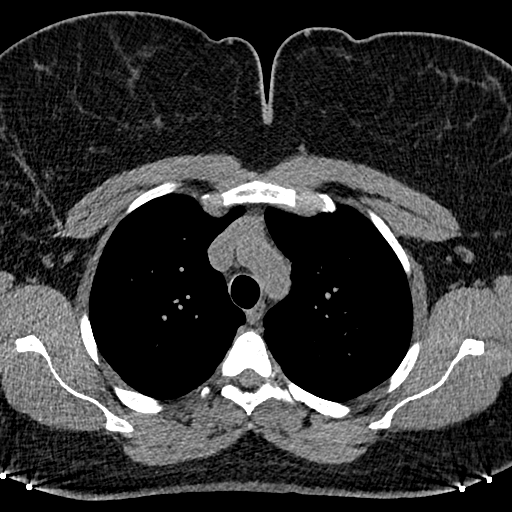
[im 106/151  lung]
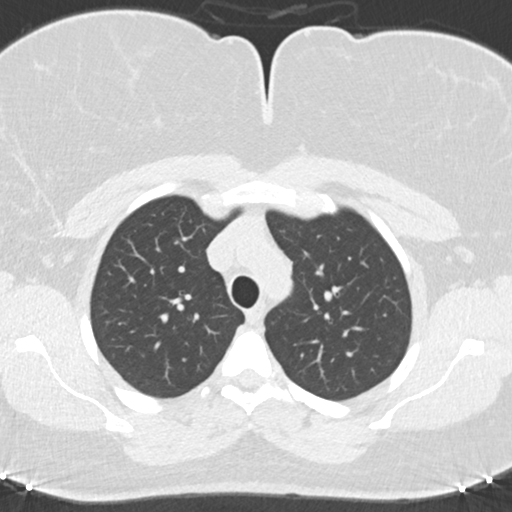
[im 117/151  lung]
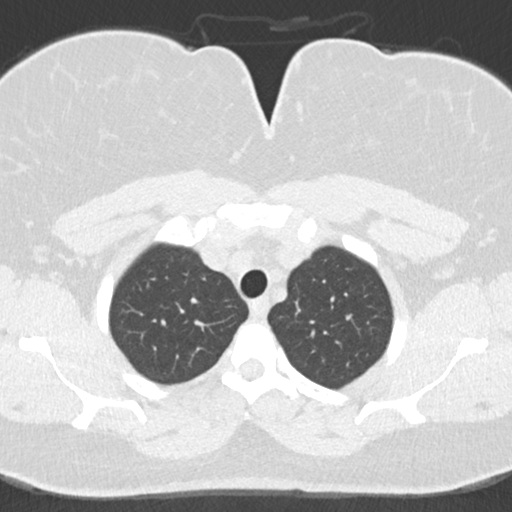
[im 128/151  lung]
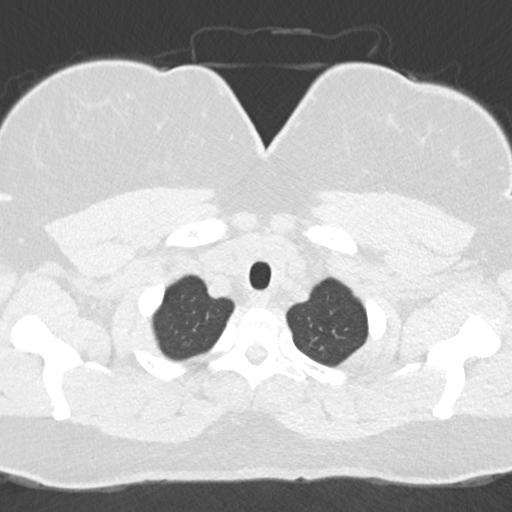
[im 139/151  lung]
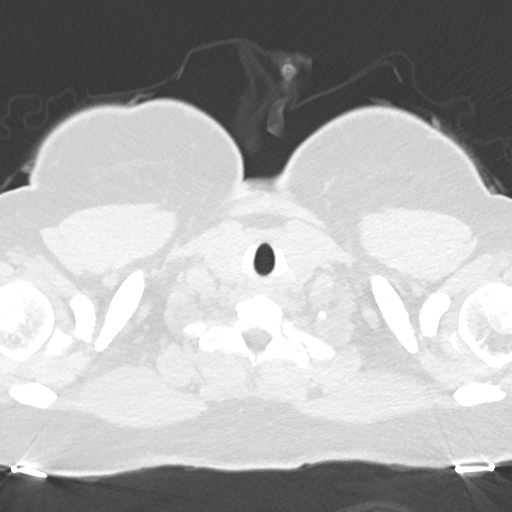

[Series 5: coronal · coronal · 0.63mm/px · 3 of 155 slices shown]
[im 31/155  lung]
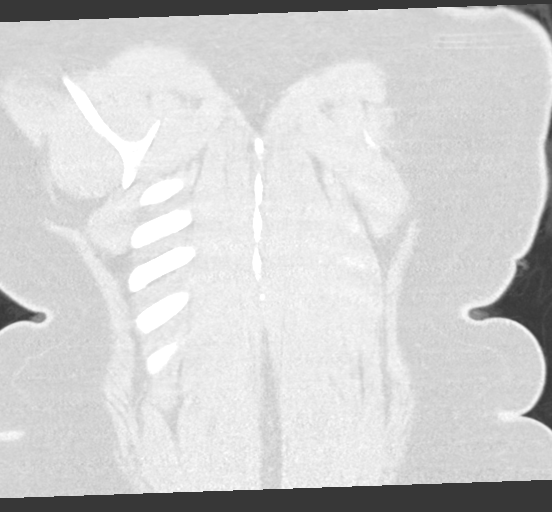
[im 62/155  lung]
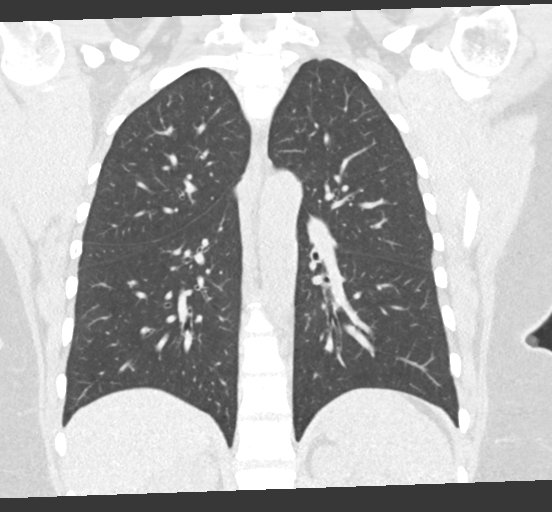
[im 93/155  lung]
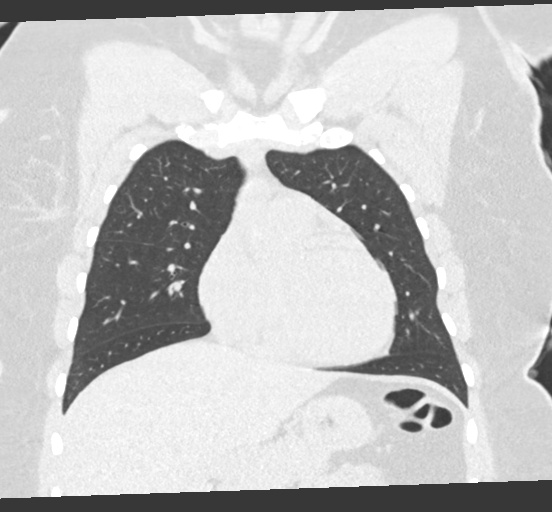

[15 of 36 positions shown; findings below may reference images not displayed]

FINDINGS: Cardiovascular: No significant vascular findings. Normal heart size.
No pericardial effusion.

Mediastinum/Nodes: No enlarged mediastinal, hilar, or axillary lymph
nodes. Thyroid gland, trachea, and esophagus demonstrate no
significant findings.

Lungs/Pleura: Stable, benign 4 mm pulmonary nodules of the right
lower lobe (series 3, image 93) and right middle lobe (series 3,
image 79). No pleural effusion or pneumothorax.

Upper Abdomen: No acute abnormality.

Musculoskeletal: No chest wall mass or suspicious bone lesions
identified.
IMPRESSION: Stable, benign 4 mm pulmonary nodules of the right lower lobe
(series 3, image 93) and right middle lobe (series 3, image 79). No
further routine follow-up is required for these nodules.

## 2021-05-02 ENCOUNTER — Encounter: Payer: Self-pay | Admitting: Physical Medicine and Rehabilitation

## 2021-05-06 ENCOUNTER — Other Ambulatory Visit: Payer: Self-pay

## 2021-05-06 ENCOUNTER — Ambulatory Visit (INDEPENDENT_AMBULATORY_CARE_PROVIDER_SITE_OTHER): Payer: 59 | Admitting: Licensed Clinical Social Worker

## 2021-05-06 DIAGNOSIS — F4310 Post-traumatic stress disorder, unspecified: Secondary | ICD-10-CM

## 2021-05-06 NOTE — Plan of Care (Signed)
°  Problem: PTSD-Trauma Disorder CCP Problem  1 Reduce the negative impact trauma related symptoms have on social, occupational, and family functioning. Goal: LTG: Reduce frequency, intensity, and duration of PTSD symptoms so daily functioning is improved: Input needed on appropriate metric.  Self report scale. Outcome: Progressing Note: Some continuing sxs due to pt feeling overwhelmed/out of control Goal: STG: Practice interpersonal effectiveness skills 7 times per week for the next 16 weeks Outcome: Progressing   Problem: Depression CCP Problem  1 Decrease depressive symptoms and improve levels of effective functioning Goal: LTG: Reduce frequency, intensity, and duration of depression symptoms as evidenced by: SSB input needed on appropriate metric Outcome: Progressing Goal: STG: [name] will participate in at least 80% of scheduled individual psychotherapy sessions Outcome: Progressing

## 2021-05-06 NOTE — Progress Notes (Signed)
Virtual Visit via Audio Note  I connected with Catherine Berg on 05/06/21 at 10:00 AM EST by an audio enabled telemedicine application and verified that I am speaking with the correct person using two identifiers.  Pt 30 min late for appt--stated she was in video chat but clinician was in video chat the entire time.  Location: Patient: home Provider: remote office [place], [place])   I discussed the limitations of evaluation and management by telemedicine and the availability of in person appointments. The patient expressed understanding and agreed to proceed.  I discussed the assessment and treatment plan with the patient. The patient was provided an opportunity to ask questions and all were answered. The patient agreed with the plan and demonstrated an understanding of the instructions.   The patient was advised to call back or seek an in-person evaluation if the symptoms worsen or if the condition fails to improve as anticipated.  I provided 30 minutes of non-face-to-face time during this encounter.   Pace, LCSW   THERAPIST PROGRESS NOTE  Session Time: 1030a-11aa  Participation Level: Active  Behavioral Response: NeatAlertAnxious and Depressed  Type of Therapy: Individual Therapy  Treatment Goals addressed:  Problem: PTSD-Trauma Disorder CCP Problem  1 Reduce the negative impact trauma related symptoms have on social, occupational, and family functioning.  Goal: LTG: Reduce frequency, intensity, and duration of PTSD symptoms so daily functioning is improved: Input needed on appropriate metric.  Self report scale. Outcome: Progressing Note: Some continuing sxs due to pt feeling overwhelmed/out of control  Goal: STG: Practice interpersonal effectiveness skills 7 times per week for the next 16 weeks Outcome: Progressing   Problem: Depression CCP Problem  1 Decrease depressive symptoms and improve levels of effective functioning  Goal: LTG: Reduce frequency,  intensity, and duration of depression symptoms as evidenced by: SSB input needed on appropriate metric Outcome: Progressing  Goal: STG: [name] will participate in at least 80% of scheduled individual psychotherapy sessions Outcome: Progressing  Interventions: I  Intervention: Encourage self-care activities  Intervention: Work with patient to identify the major components of a recent episode of anxiety: physical symptoms, major thoughts and images, and major behaviors they experienced  Intervention: Identify effective coping behavior  Summary: Catherine Berg is a 41 y.o. female who presents with stabilizing symptoms related to PTSD diagnosis. Pt reports that overall mood has been stable and that she is managing situational anxiety and triggers well. Pt reports good quality and quantity of sleep.  Allowed pt to explore and express thoughts and feelings associated with recent life situations and external stressors. Discussed pt wanting to improve overall time management, motivation, and initiative involving large tasks that need to be done. Discussed time management strategies and using logic/reason to plan out "to do" lists. Reviewed importance of having appointments in writing and having a written planner with activities/appointments inside.  Continued recommendations are as follows: self care behaviors, positive social engagements, focusing on overall work/home/life balance, and focusing on positive physical and emotional wellness. .   Suicidal/Homicidal: No  Therapist Response: Pt is continuing to apply interventions learned in session into daily life situations. Pt is currently on track to meet goals utilizing interventions mentioned above. Personal growth and progress noted. Treatment to continue as indicated.   Plan: Return again in 4 weeks.  Diagnosis: Axis I: PTSD    Axis II: No diagnosis    Rachel Bo Lizbet Cirrincione, LCSW 05/06/2021

## 2021-05-06 NOTE — Plan of Care (Signed)
°  Problem: PTSD-Trauma Disorder CCP Problem  1 Reduce the negative impact trauma related symptoms have on social, occupational, and family functioning. Goal: LTG: Reduce frequency, intensity, and duration of PTSD symptoms so daily functioning is improved: Input needed on appropriate metric.  Self report scale. Outcome: Progressing Note: Some continuing sxs due to pt feeling overwhelmed/out of control Goal: STG: Practice interpersonal effectiveness skills 7 times per week for the next 16 weeks Outcome: Progressing Intervention: Encourage self-care activities Intervention: Work with patient to identify the major components of a recent episode of anxiety: physical symptoms, major thoughts and images, and major behaviors they experienced Intervention: Identify effective coping behavior   Problem: Depression CCP Problem  1 Decrease depressive symptoms and improve levels of effective functioning Goal: LTG: Reduce frequency, intensity, and duration of depression symptoms as evidenced by: SSB input needed on appropriate metric Outcome: Progressing Goal: STG: [name] will participate in at least 80% of scheduled individual psychotherapy sessions Outcome: Progressing
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
## 2021-05-12 ENCOUNTER — Telehealth (INDEPENDENT_AMBULATORY_CARE_PROVIDER_SITE_OTHER): Payer: Self-pay | Admitting: Psychiatry

## 2021-05-12 ENCOUNTER — Other Ambulatory Visit: Payer: Self-pay

## 2021-05-12 ENCOUNTER — Encounter: Payer: Self-pay | Admitting: Psychiatry

## 2021-05-12 DIAGNOSIS — F411 Generalized anxiety disorder: Secondary | ICD-10-CM

## 2021-05-12 DIAGNOSIS — F4310 Post-traumatic stress disorder, unspecified: Secondary | ICD-10-CM

## 2021-05-12 DIAGNOSIS — F330 Major depressive disorder, recurrent, mild: Secondary | ICD-10-CM

## 2021-05-12 NOTE — Patient Instructions (Addendum)
Restart duloxetine 90 mg daily (increase by 30 mg per day up to 90 mg) Restart bupropion 450 mg daily (increase by 150 mg per day up to 450 mg) Start quetiapine 25 mg at night Next appointment: 3/9 at 3:30, video

## 2021-05-15 NOTE — Progress Notes (Signed)
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 She depression prior to [place] she is now having PTSD and worsening of her depression, she is seeing psychiatrist and therapist  She has seen multiple doctors: [facility], Pulmonologist, Rheumatologist, Psychiatrist, Neurologist, PT, OT now also PMR doctors and is on higher dose of Gabapentin   She feels lonely, she feels overwhelmed  each day frustrated. She states the PMR physician has really helped her feel seen - Dr. Sima Matas   She states her symptoms have been stable.   Asthma Mild intermittent: she takes singulair and Dulera at night, sees Dr. Mortimer Fries, she has intermittent SOB. No cough or wheezing a this time  Perennial AR: she has chronic rhinorrhea, no itching or nasal congestion, using Astelin and it helps.   Vitamin D deficiency: she would like to resume vitamin D supplements   Morbid Obesity: weight has been stable, BMI is above 40, she does not have insurance at this time and cannot afford medication. She has applied for social security disability from previous employer, currently using her long term disability for income but no longer has insurance. PMR provider filled out disability papers. 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   COVID-19 long hauler manifesting chronic dyspnea  07/02/2020   Vitamin D deficiency 04/22/2020   COVID-19 long hauler manifesting chronic fatigue 04/22/2020   History of COVID-19 02/28/2020   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   Memory loss 02/28/2020   Physical deconditioning 02/28/2020   Dyspnea on exertion 02/28/2020   Neck pain, bilateral 02/28/2020   Muscle spasms of neck 02/28/2020   Arthralgia of hand 01/24/2019   CRP elevated 01/24/2019   ESR raised 01/24/2019   Class 3 severe obesity due to excess calories without serious comorbidity in adult [facility]) 01/24/2019   Palpitations 09/16/2018   Coronary artery calcification 09/16/2018   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   Bilateral leg edema 04/20/2017   Incidental lung nodule, > 18m and < 893m11/28/2018   Hives 09/02/2015   Morbid obesity ([place]04/13/2017   Anxiety 07/09/2015   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 06/28/2015   Thyroid nodule 02/01/2015   Vitamin B12 deficiency    History of abnormal cervical Pap smear    Allergy-induced asthma, mild intermittent, uncomplicated [id number]  Past Surgical History:  Procedure Laterality Date   CERVICAL POLYPECTOMY     CESAREAN SECTION     x 2   COLONOSCOPY WITH PROPOFOL N/A 11/14/2018   Procedure: COLONOSCOPY WITH PROPOFOL;  Surgeon: TaVirgel ManifoldMD;  [facility];  Laterality: N/A;   ESOPHAGOGASTRODUODENOSCOPY (EGD) WITH PROPOFOL N/A 11/14/2018   Procedure: ESOPHAGOGASTRODUODENOSCOPY (EGD) WITH PROPOFOL;  Surgeon: TaVirgel ManifoldMD;  [place] [facility];  Laterality: N/A;  Latex allergy   GIVENS CAPSULE STUDY N/A 12/27/2018   Procedure:  GIVENS CAPSULE STUDY;  Surgeon: Virgel Manifold, MD;  Location: [facility] ENDOSCOPY;  Service: Endoscopy;  Laterality: N/A;    Family History  Problem Relation Age of Onset   Heart attack Mother 53   Hypertension Mother    Asthma Mother    Cancer Mother        laryngeal   Diabetes Mother        pre diabetic    Heart disease Mother    Mental illness Mother    Alcohol abuse Mother    Drug abuse Mother    Depression Mother    Anxiety disorder Mother    Bipolar disorder Mother    Learning disabilities Brother    ADD / ADHD Brother    Asthma Son    Hyperlipidemia Brother    Alcohol abuse Father    Heart disease Maternal Grandmother        heart attack, pacemaker   Heart attack Maternal Grandmother    Hypertension Maternal Grandmother    Hypertension Maternal Grandfather    Heart disease Paternal Grandmother        couple of major open heart surgeries, leaking valves   Hypertension Paternal Grandmother    Hypertension Paternal Grandfather    Breast cancer Neg Hx     Social History   Tobacco Use   Smoking status: Never   Smokeless tobacco: Never  Substance Use Topics   Alcohol use: No    Alcohol/week: 0.0 standard drinks     Current Outpatient Medications:    albuterol (PROVENTIL HFA;VENTOLIN HFA) 108 (90 Base) MCG/ACT inhaler, Inhale 2 puffs into the lungs every 6 (six) hours as needed for wheezing or shortness of breath., Disp: 18 g, Rfl: 1   Azelastine HCl 137 MCG/SPRAY SOLN, SPRAY TWO SPRAYS IN EACH NOSTRIL TWICE DAILY., Disp: 30 mL, Rfl: 1   buPROPion (WELLBUTRIN XL) 150 MG 24 hr tablet, Total of 450 mg daily. Take along with 300 mg tab, Disp: 90 tablet, Rfl: 1   buPROPion (WELLBUTRIN XL) 300 MG 24 hr tablet, Take 1 tablet (300 mg total) by mouth daily. Take along with 150 mg tab, total of 450 mg daily., Disp: 90 tablet, Rfl: 1   clobetasol (TEMOVATE) 0.05 % external solution, Apply 1 application topically 2 (two) times daily., Disp: 50 mL, Rfl: 0   DULoxetine (CYMBALTA) 30 MG capsule, Take 3 capsules (90 mg total) by mouth daily., Disp: 270 capsule, Rfl: 0   etonogestrel (NEXPLANON) 68 MG IMPL implant, Inject 68 mg into the skin once., Disp: , Rfl:    gabapentin (NEURONTIN) 300 MG capsule, Take 1 capsule (300 mg total) by mouth 3 (three) times daily., Disp: 90 capsule, Rfl: 3    levothyroxine (SYNTHROID) 75 MCG tablet, Take 75 mcg by mouth daily before breakfast., Disp: , Rfl:    mometasone-formoterol (DULERA) 200-5 MCG/ACT AERO, Inhale 2 puffs into the lungs in the morning and at bedtime., Disp: 1 each, Rfl: 11   montelukast (SINGULAIR) 10 MG tablet, Take 1 tablet (10 mg total) by mouth at bedtime., Disp: 90 tablet, Rfl: 1   QUEtiapine (SEROQUEL) 25 MG tablet, Take 1 tablet (25 mg total) by mouth at bedtime., Disp: 30 tablet, Rfl: 1   Vitamin D, Ergocalciferol, (DRISDOL) 1.25 MG (50000 UNIT) CAPS capsule, Take 1 capsule (50,000 Units total) by mouth every 7 (seven) days. (Patient not taking: Reported on 05/19/2021), Disp: 12 capsule, Rfl: 1  Allergies  Allergen Reactions   Latex Hives    (Balloons, condoms, underwear elastic, gloves)  Shellfish Allergy Hives    I personally reviewed active problem list, medication list, allergies, family history, social history, health maintenance with the patient/caregiver today.   ROS  Ten systems reviewed and is negative except as mentioned in HPI   Objective  Vitals:   05/19/21 1337  BP: 128/84  Pulse: 87  Resp: 16  Temp: 98.1 F (36.7 C)  SpO2: 99%  Weight: 228 lb (103.4 kg)  Height: 5' 3" (1.6 m)    Body mass index is 40.39 kg/m.  Physical Exam  Constitutional: Patient appears well-developed and well-nourished. Obese  No distress.  HEENT: head atraumatic, normocephalic, pupils equal and reactive to light,  neck supple Cardiovascular: Normal rate, regular rhythm and normal heart sounds.  No murmur heard. No BLE edema. Pulmonary/Chest: Effort normal and breath sounds normal. No respiratory distress. Abdominal: Soft.  There is no tenderness. Psychiatric: Patient has a normal mood and affect. behavior is normal. Judgment and thought content normal.  Muscular Skeletal: aching on legs during palpation   PHQ2/9: Depression screen [facility] 2/9 05/19/2021 01/23/2021 12/27/2020 12/26/2020 10/28/2020  Decreased Interest _0 0 1  Down, Depressed, Hopeless _1 0 1  PHQ - 2 Score _2 0 2  Altered sleeping 1 2 0 - -  Tired, decreased energy _3 - -  Change in appetite 0 0 0 - -  Feeling bad or failure about yourself  2 3 0 - -  Trouble concentrating 0 1 1 - -  Moving slowly or fidgety/restless 0 0 1 - -  Suicidal thoughts 0 0 0 - -  PHQ-9 Score _4 - -  Difficult doing work/chores - Somewhat difficult Not difficult at all - -  Some recent data might be hidden    phq 9 is positive   Fall Risk: Fall Risk  05/19/2021 01/23/2021 12/26/2020 08/20/2020 07/17/2020  Falls in the past year? 0 0 0 0 1  Number falls in past yr: 0 - 0 0 0  Injury with Fall? 0 - 0 0 1  Comment - - - - -  Risk for fall due to : No Fall Risks - No Fall Risks - -  Follow up Falls prevention discussed - Falls prevention discussed - -      Functional Status Survey: Is the patient deaf or have difficulty hearing?: No Does the patient have difficulty seeing, even when wearing glasses/contacts?: Yes Does the patient have difficulty concentrating, remembering, or making decisions?: Yes Does the patient have difficulty walking or climbing stairs?: No Does the patient have difficulty dressing or bathing?: No Does the patient have difficulty doing errands alone such as visiting a doctor's office or shopping?: No    Assessment & Plan   1. COVID-19 long hauler  Stable   2. Neuropathy involving both lower extremities   3. Vitamin D deficiency disease  - Vitamin D, Ergocalciferol, (DRISDOL) 1.25 MG (50000 UNIT) CAPS capsule; Take 1 capsule (50,000 Units total) by mouth every 7 (seven) days.  Dispense: 12 capsule; Refill: 1  4. Mild intermittent asthma without complication  - montelukast (SINGULAIR) 10 MG tablet; Take 1 tablet (10 mg total) by mouth at bedtime.  Dispense: 90 tablet; Refill: 1 - albuterol (VENTOLIN HFA) 108 (90 Base) MCG/ACT inhaler; Inhale 2 puffs into the lungs every 6 (six) hours as needed for wheezing  or shortness of breath.  Dispense: 18 g; Refill: 1  5. Perennial allergic rhinitis with seasonal variation  - montelukast (  SINGULAIR) 10 MG tablet; Take 1 tablet (10 mg total) by mouth at bedtime.  Dispense: 90 tablet; Refill: 1 - Azelastine HCl 137 MCG/SPRAY SOLN; SPRAY TWO SPRAYS IN EACH NOSTRIL TWICE DAILY.  Dispense: 30 mL; Refill: 1  6. Nasal congestion

## 2021-05-19 ENCOUNTER — Ambulatory Visit: Payer: Self-pay | Admitting: Family Medicine

## 2021-05-19 ENCOUNTER — Other Ambulatory Visit: Payer: Self-pay

## 2021-05-19 ENCOUNTER — Encounter: Payer: Self-pay | Admitting: Family Medicine

## 2021-05-19 VITALS — BP 128/84 | HR 87 | Temp 98.1°F | Resp 16 | Ht 63.0 in | Wt 228.0 lb

## 2021-05-19 DIAGNOSIS — J302 Other seasonal allergic rhinitis: Secondary | ICD-10-CM

## 2021-05-19 DIAGNOSIS — E559 Vitamin D deficiency, unspecified: Secondary | ICD-10-CM

## 2021-05-19 DIAGNOSIS — J3089 Other allergic rhinitis: Secondary | ICD-10-CM

## 2021-05-19 DIAGNOSIS — G5793 Unspecified mononeuropathy of bilateral lower limbs: Secondary | ICD-10-CM

## 2021-05-19 DIAGNOSIS — U099 Post covid-19 condition, unspecified: Secondary | ICD-10-CM

## 2021-05-19 DIAGNOSIS — J4520 Mild intermittent asthma, uncomplicated: Secondary | ICD-10-CM

## 2021-05-19 DIAGNOSIS — R0981 Nasal congestion: Secondary | ICD-10-CM

## 2021-05-19 MED ORDER — MONTELUKAST SODIUM 10 MG PO TABS: 10.0000 mg | ORAL_TABLET | Freq: Every day | ORAL | 1 refills | Status: DC

## 2021-05-19 MED ORDER — VITAMIN D (ERGOCALCIFEROL) 1.25 MG (50000 UNIT) PO CAPS: 50000.0000 [IU] | ORAL_CAPSULE | ORAL | 1 refills | Status: DC

## 2021-05-19 MED ORDER — AZELASTINE HCL 137 MCG/SPRAY NA SOLN: NASAL | 1 refills | Status: DC

## 2021-05-19 MED ORDER — ALBUTEROL SULFATE HFA 108 (90 BASE) MCG/ACT IN AERS: 2.0000 | INHALATION_SPRAY | Freq: Four times a day (QID) | RESPIRATORY_TRACT | 1 refills | Status: DC | PRN

## 2021-06-10 ENCOUNTER — Ambulatory Visit (INDEPENDENT_AMBULATORY_CARE_PROVIDER_SITE_OTHER): Payer: Self-pay | Admitting: Licensed Clinical Social Worker

## 2021-06-10 ENCOUNTER — Other Ambulatory Visit: Payer: Self-pay

## 2021-06-10 DIAGNOSIS — F4310 Post-traumatic stress disorder, unspecified: Secondary | ICD-10-CM

## 2021-06-10 NOTE — Plan of Care (Signed)
Problem: PTSD-Trauma Disorder CCP Problem  1 Reduce the negative impact trauma related symptoms have on social, occupational, and family functioning 3 out of 5 documented sessions.  Goal: LTG: Reduce frequency, intensity, and duration of PTSD symptoms so daily functioning is improved: Input needed on appropriate metric.  Self report scale. Outcome: Progressing  Goal: STG: Practice interpersonal effectiveness skills 7 times per week for the next 16 weeks Outcome: Progressing   Problem: Depression CCP Problem  1 Decrease depressive symptoms and improve levels of effective functioning  Goal: LTG: Reduce frequency, intensity, and duration of depression symptoms as evidenced by: SSB input needed on appropriate metric Outcome: Progressing  Goal: STG: [name] will participate in at least 80% of scheduled individual psychotherapy sessions Outcome: Progressing  Intervention: Encourage self-care activities  Intervention: Work with patient to identify the major components of a recent episode of anxiety: physical symptoms, major thoughts and images, and major behaviors they experienced  Intervention: Identify effective coping behavior

## 2021-06-10 NOTE — Progress Notes (Signed)
Virtual Visit via Video Note  I connected with Catherine Berg on 06/10/21 at  1:00 PM EST by a video enabled telemedicine application and verified that I am speaking with the correct person using two identifiers.  Pt 30 min late for appt--stated she was in video chat but clinician was in video chat the entire time.  Location: Patient: home Provider: remote office [place], [place])   I discussed the limitations of evaluation and management by telemedicine and the availability of in person appointments. The patient expressed understanding and agreed to proceed.  I discussed the assessment and treatment plan with the patient. The patient was provided an opportunity to ask questions and all were answered. The patient agreed with the plan and demonstrated an understanding of the instructions.   The patient was advised to call back or seek an in-person evaluation if the symptoms worsen or if the condition fails to improve as anticipated.  I provided 55 minutes of non-face-to-face time during this encounter.   Catherine Steury R Catherine Schou, LCSW   THERAPIST PROGRESS NOTE  Session Time: 1-155p  Participation Level: Active  Behavioral Response: NeatAlertAnxious and Depressed  Type of Therapy: Individual Therapy  Treatment Goals addressed:  Problem: PTSD-Trauma Disorder CCP Problem  1 Reduce the negative impact trauma related symptoms have on social, occupational, and family functioning 3 out of 5 documented sessions.  Goal: LTG: Reduce frequency, intensity, and duration of PTSD symptoms so daily functioning is improved: Input needed on appropriate metric.  Self report scale. Outcome: Progressing  Goal: STG: Practice interpersonal effectiveness skills 7 times per week for the next 16 weeks Outcome: Progressing   Problem: Depression CCP Problem  1 Decrease depressive symptoms and improve levels of effective functioning  Goal: LTG: Reduce frequency, intensity, and duration of depression symptoms  as evidenced by: SSB input needed on appropriate metric Outcome: Progressing  Goal: STG: [name] will participate in at least 80% of scheduled individual psychotherapy sessions Outcome: Progressing  Interventions:   Intervention: Encourage self-care activities  Intervention: Work with patient to identify the major components of a recent episode of anxiety: physical symptoms, major thoughts and images, and major behaviors they experienced  Intervention: Identify effective coping behavior  Summary: Catherine Berg is a 41 y.o. female who presents with stabilizing symptoms related to PTSD diagnosis. Pt reports that overall mood has been stable (some fluctuation) and that she is managing situational anxiety and triggers well. Pt reports good quality and quantity of sleep.  Allowed pt to explore and express thoughts and feelings associated with recent life situations and external stressors. Explored pts relationship with siblings--more particularly younger brother. Pt reports she has a good relationship with brother. Discussed background and childhood shared experiences for pt and brother.Pt reports she often blames herself for her brother's choices/outcomes. Discussed pt doing the best she could to help guide her brother at age 70. Helped pt identify strengths that helped her at that time in life.  Explored relationships with children and struggles with academics (son). Pt related son's experience to her own personal experience in high school. Supported pt unconditionally.  Continued recommendations are as follows: self care behaviors, positive social engagements, focusing on overall work/home/life balance, and focusing on positive physical and emotional wellness. .   Suicidal/Homicidal: No  Therapist Response: Pt is continuing to apply interventions learned in session into daily life situations. Pt is currently on track to meet goals utilizing interventions mentioned above. Personal  growth and progress noted. Treatment to continue as indicated.  Plan: Return again in 4 weeks.  Diagnosis: Axis I: PTSD    Axis II: No diagnosis    Catherine Haber Dewell Monnier, LCSW 06/10/2021

## 2021-06-17 ENCOUNTER — Encounter: Payer: Self-pay | Attending: Physical Medicine and Rehabilitation | Admitting: Physical Medicine and Rehabilitation

## 2021-06-17 ENCOUNTER — Encounter: Payer: Self-pay | Admitting: Physical Medicine and Rehabilitation

## 2021-06-17 ENCOUNTER — Other Ambulatory Visit: Payer: Self-pay

## 2021-06-17 VITALS — BP 136/89 | HR 96 | Temp 98.2°F | Ht 63.0 in | Wt 227.0 lb

## 2021-06-17 DIAGNOSIS — M79605 Pain in left leg: Secondary | ICD-10-CM | POA: Insufficient documentation

## 2021-06-17 DIAGNOSIS — R413 Other amnesia: Secondary | ICD-10-CM | POA: Insufficient documentation

## 2021-06-17 DIAGNOSIS — M79604 Pain in right leg: Secondary | ICD-10-CM | POA: Insufficient documentation

## 2021-06-17 DIAGNOSIS — G8929 Other chronic pain: Secondary | ICD-10-CM | POA: Insufficient documentation

## 2021-06-17 NOTE — Progress Notes (Signed)
Subjective:    Patient ID: Catherine Berg, female    DOB: [birth year], 41 y.o.   MRN: [id number]  HPI 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 It makes her sleepy.  -she has tried a couple of things but nothing has helped -pain feels burning  -extends from her thighs to ankle -she tries to rest her legs so they do not hurt as badly at night.  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  -the lack of control really bothers her -some days this really gets to her -she forgets to pay bills if she does not do it right away.   2) confusion:  -it is embarrassing to her -she did speech language therapy -she holds back on her medicine when she notes it affecting her cognition -she feels this is one of her top challenges daily.  -she had been out of work for a long time before she came to see me -they put it in that she resigned her work so she lost her benefits.  -she saw Dr. Kieth Brightly.    Pain Inventory Average Pain 8 Pain Right Now 6 My pain is dull and aching  In the last 24 hours, has pain interfered with the following? General activity 4 Relation with others 0 Enjoyment of life 10 What TIME of day is your pain at its worst? night Sleep (in general) Fair  Pain is worse with: walking Pain improves with: rest and medication Relief from Meds: 6    Family History  Problem Relation Age of Onset   Heart attack Mother 37   Hypertension Mother    Asthma Mother    Cancer Mother        laryngeal   Diabetes Mother        pre diabetic   Heart disease Mother    Mental illness Mother    Alcohol abuse Mother    Drug abuse Mother    Depression Mother    Anxiety disorder Mother    Bipolar disorder  Mother    Learning disabilities Brother    ADD / ADHD Brother    Asthma Son    Hyperlipidemia Brother    Alcohol abuse Father    Heart disease Maternal Grandmother        heart attack, pacemaker   Heart attack Maternal Grandmother    Hypertension Maternal Grandmother    Hypertension Maternal Grandfather    Heart disease Paternal Grandmother        couple of major open heart surgeries, leaking valves   Hypertension Paternal Grandmother    Hypertension Paternal Grandfather    Breast cancer Neg Hx    Social History   Socioeconomic History   Marital status: Single    Spouse name: Not on file   Number of children: 2   Years of education: 11   Highest education level: High school graduate  Occupational History   Not on file  Tobacco Use   Smoking status: Never   Smokeless tobacco: Never  Vaping Use   Vaping Use: Never used  Substance and Sexual Activity   Alcohol use: No    Alcohol/week: 0.0 standard drinks   Drug use: No   Sexual activity: Yes  Partners: Male    Birth control/protection: None  Other Topics Concern   Not on file  Social History Narrative   Not on file   Social Determinants of Health   Financial Resource Strain: Not on file  Food Insecurity: Not on file  Transportation Needs: Not on file  Physical Activity: Not on file  Stress: Not on file  Social Connections: Not on file   Past Surgical History:  Procedure Laterality Date   CERVICAL POLYPECTOMY     CESAREAN SECTION     x 2   COLONOSCOPY WITH PROPOFOL N/A 11/14/2018   Procedure: COLONOSCOPY WITH PROPOFOL;  Surgeon: Pasty Spillers, MD;  Location[facility];  Service: Endoscopy;  Laterality: N/A;   ESOPHAGOGASTRODUODENOSCOPY (EGD) WITH PROPOFOL N/A 11/14/2018   Procedure: ESOPHAGOGASTRODUODENOSCOPY (EGD) WITH PROPOFOL;  Surgeon: Pasty Spillers, MD;  Location: [facility];  Service: Endoscopy;  Laterality: N/A;  Latex allergy   GIVENS CAPSULE STUDY N/A 12/27/2018    Procedure: GIVENS CAPSULE STUDY;  Surgeon: Pasty Spillers, MD;  Location: [facility];  Service: Endoscopy;  Laterality: N/A;   Past Medical History:  Diagnosis Date   Abnormal thyroid blood test    Anemia    Anxiety    Asthma    Complication of anesthesia    itching after c sections   COVID-19 01/2020   Depression    Elevated serum glutamic pyruvic transaminase (SGPT) level    Graves disease    History of abnormal cervical Pap smear    History of cervical polypectomy    Hives 09/02/2015   Hypertension    Hypothyroidism    IFG (impaired fasting glucose)    Incidental lung nodule, > 75mm and < 22mm 03/31/2017   4 mm RLL lung nodule on chest CT Mar 31, 2017   Low serum vitamin D    Obesity    PONV (postoperative nausea and vomiting)    after c section had vomit   Pregnancy induced hypertension    with 1st pregnancy, normal pressure with 2nd   Reflux    Thyroid disease    Vitamin B12 deficiency    Vitamin D deficiency disease    Wears dentures    full upper   There were no vitals taken for this visit.  Opioid Risk Score:   Fall Risk Score:  `1  Depression screen PHQ 2/9  Depression screen [facility] 2/9 05/19/2021 01/23/2021 12/26/2020 08/20/2020 07/17/2020 07/03/2020 06/10/2020  Decreased Interest 2 2 0 2 2 2 2   Down, Depressed, Hopeless 1 1 0 2 1 2 1   PHQ - 2 Score 3 3 0 4 3 4 3   Altered sleeping 1 2 - 2 1 2 1   Tired, decreased energy 1 3 - 3 1 1 2   Change in appetite 0 0 - 0 0 2 3  Feeling bad or failure about yourself  2 3 - 1 1 1 1   Trouble concentrating 0 1 - 0 0 0 0  Moving slowly or fidgety/restless 0 0 - 0 0 0 0  Suicidal thoughts 0 0 - 1 0 0 0  PHQ-9 Score 7 12 - 11 6 10 10   Difficult doing work/chores - Somewhat difficult - - - - Very difficult  Some encounter information is confidential and restricted. Go to Review Flowsheets activity to see all data.  Some recent data might be hidden      Review of Systems  Musculoskeletal:        Pain from thighs to  lower  legs in both legs  Psychiatric/Behavioral:  Positive for dysphoric mood. Negative for confusion. The patient is not nervous/anxious.   All other systems reviewed and are negative.     Objective:   Physical Exam Gen: no distress, normal appearing HEENT: oral mucosa pink and moist, NCAT Cardio: Reg rate Chest: normal effort, normal rate of breathing Abd: soft, non-distended Ext: no edema Psych: pleasant, normal affect Skin: intact Neuro: Alert and oriented x3. Cognitive impairments, slow thought processing.      Assessment & Plan:  Mrs. Faulks is a 41 year old woman who presents to establish care for bilateral lower extremity pain  Bilateral lower extremity neuropathy s/p LONG COVID syndrome -Discussed current symptoms of pain and history of pain.  -Discussed benefits of exercise in reducing pain. -Continue gabapentin to 300mg  TID PRN.  -completing disability paperwork for her -prescribed low dose naltrexone.  -Provided with a pain relief journal and discussed that it contains foods and lifestyle tips to naturally help to improve pain. Discussed that these lifestyle strategies are also very good for health unlike some medications which can have negative side effects. Discussed that the act of keeping a journal can be therapeutic and helpful to realize patterns what helps to trigger and alleviate pain.   -Discussed following foods that may reduce pain: 1) Ginger (especially studied for arthritis)- reduce leukotriene production to decrease inflammation 2) Blueberries- high in phytonutrients that decrease inflammation 3) Salmon- marine omega-3s reduce joint swelling and pain 4) Pumpkin seeds- reduce inflammation 5) dark chocolate- reduces inflammation 6) turmeric- reduces inflammation 7) tart cherries - reduce pain and stiffness 8) extra virgin olive oil - its compound olecanthal helps to block prostaglandins  9) chili peppers- can be eaten or applied topically via capsaicin 10)  mint- helpful for headache, muscle aches, joint pain, and itching 11) garlic- reduces inflammation  Link to further information on diet for chronic pain:    2) Expressive aphasia -very embarrassing to her -continue SLP HEP -continue to follow with neuropsych once she has Medicaid to pay for their appointments -discussed her conversation with Dr. [email or web address] which was very helpful for her.

## 2021-06-17 NOTE — Progress Notes (Deleted)
° °  Subjective:    Patient ID: Catherine Berg, female    DOB: 20-Mar-1981, 41 y.o.   MRN: [id number]  HPI    Review of Systems     Objective:   Physical Exam        Assessment & Plan:

## 2021-06-17 NOTE — Patient Instructions (Signed)
nattokinase
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
## 2021-07-10 ENCOUNTER — Encounter: Payer: Self-pay | Admitting: Psychiatry

## 2021-07-10 ENCOUNTER — Other Ambulatory Visit: Payer: Self-pay

## 2021-07-10 ENCOUNTER — Telehealth (INDEPENDENT_AMBULATORY_CARE_PROVIDER_SITE_OTHER): Payer: Self-pay | Admitting: Psychiatry

## 2021-07-10 ENCOUNTER — Encounter: Payer: Self-pay | Admitting: Physical Medicine and Rehabilitation

## 2021-07-10 DIAGNOSIS — F411 Generalized anxiety disorder: Secondary | ICD-10-CM

## 2021-07-10 DIAGNOSIS — F4310 Post-traumatic stress disorder, unspecified: Secondary | ICD-10-CM

## 2021-07-10 DIAGNOSIS — F330 Major depressive disorder, recurrent, mild: Secondary | ICD-10-CM

## 2021-07-10 MED ORDER — BUPROPION HCL ER (XL) 150 MG PO TB24: ORAL_TABLET | ORAL | 1 refills | Status: DC

## 2021-07-10 MED ORDER — DULOXETINE HCL 30 MG PO CPEP: ORAL_CAPSULE | ORAL | 1 refills | Status: DC

## 2021-07-10 NOTE — Patient Instructions (Signed)
Restart duloxetine 90 mg daily (increase by 30 mg per day up to 90 mg) ?Then restart bupropion 450 mg daily (increase by 150 mg per day up to 450 mg) ?Hold quetiapine  ?Next appointment: 5/9 at 11 AM, in person ? ?The next visit will be in person visit. Please arrive 15 mins before the scheduled time.  ? ?[facility]  ?Address: [address], [place], [place] [address]   ?

## 2021-07-23 ENCOUNTER — Ambulatory Visit (INDEPENDENT_AMBULATORY_CARE_PROVIDER_SITE_OTHER): Payer: Self-pay | Admitting: Licensed Clinical Social Worker

## 2021-07-23 ENCOUNTER — Other Ambulatory Visit: Payer: Self-pay

## 2021-07-23 DIAGNOSIS — F4310 Post-traumatic stress disorder, unspecified: Secondary | ICD-10-CM
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
## 2021-08-03 ENCOUNTER — Encounter: Payer: Self-pay | Admitting: Family Medicine

## 2021-09-05 ENCOUNTER — Ambulatory Visit: Payer: Self-pay | Admitting: Licensed Clinical Social Worker

## 2021-09-06 NOTE — Progress Notes (Signed)
BH MD/PA/NP OP Progress Note ? ?09/09/2021 11:39 AM ?Catherine Berg  ?MRN:  [id number] ? ?Chief Complaint:  ?Chief Complaint  ?Patient presents with  ? Follow-up  ? ?HPI:  ?This is a follow-up appointment for depression, anxiety and PTSD.  ?She states that she feels lost in the place.  She did not envision her to be the way she is now.  She has limited income, although she feels grateful that she has an income.  She feels that she is far behind everything to do with difficulty in concentration.  Although she used to be on top of things, she cannot stay that way.  She feels that she is unable to provide things for her children such as shoes.  She tends to stay in the house most of the time.  She feels like her house is in disarray.  She make sure that her children eat well.  She has insomnia.  She feels scared of going to sleep as she feels out of control.  She has depressive symptoms as in PHQ-9.  Although she occasionally feels that the death would be easier, she adamantly denies any plan or intent.  She agrees to contact emergency resources if any worsening.  She feels anxious especially at night.  She has not been able to restart her medication due to lack of insurance.  She is applying for disability.  She is open to contact resources for mental health if there is any.  ? ?Daily routine: ?Exercise: ?Employment: used to work as an Social worker at [employer], currently on long term disability ?Support: ?Household: 2 children, 67 year old nephew (she has a guardianship) ?Marital status: single ?Number of children: 2 (59 yo daughter, 33 yo son (diagnosed with ADHD) in 2022) ? ? ?Wt Readings from Last 3 Encounters:  ?09/09/21 228 lb 12.8 oz (103.8 kg)  ?06/17/21 227 lb (103 kg)  ?05/19/21 228 lb (103.4 kg)  ?  ?Visit Diagnosis:  ?  ICD-10-CM   ?1. PTSD (post-traumatic stress disorder)  F43.10   ?  ?2. MDD (major depressive disorder), recurrent episode, moderate (HCC)  F33.1   ?  ?3. GAD (generalized anxiety  disorder)  F41.1   ?  ? ? ?Past Psychiatric History: Please see initial evaluation for full details. I have reviewed the history. No updates at this time.  ?  ? ?Past Medical History:  ?Past Medical History:  ?Diagnosis Date  ? Abnormal thyroid blood test   ? Anemia   ? Anxiety   ? Asthma   ? Complication of anesthesia   ? itching after c sections  ? COVID-19 01/2020  ? Depression   ? Elevated serum glutamic pyruvic transaminase (SGPT) level   ? Graves disease   ? History of abnormal cervical Pap smear   ? History of cervical polypectomy   ? Hives 09/02/2015  ? Hypertension   ? Hypothyroidism   ? IFG (impaired fasting glucose)   ? Incidental lung nodule, > 44mm and < 66mm 03/31/2017  ? 4 mm RLL lung nodule on chest CT Mar 31, 2017  ? Low serum vitamin D   ? Obesity   ? PONV (postoperative nausea and vomiting)   ? after c section had vomit  ? Pregnancy induced hypertension   ? with 1st pregnancy, normal pressure with 2nd  ? Reflux   ? Thyroid disease   ? Vitamin B12 deficiency   ? Vitamin D deficiency disease   ? Wears dentures   ? full  upper  ?  ?Past Surgical History:  ?Procedure Laterality Date  ? CERVICAL POLYPECTOMY    ? CESAREAN SECTION    ? x 2  ? COLONOSCOPY WITH PROPOFOL N/A 11/14/2018  ? Procedure: COLONOSCOPY WITH PROPOFOL;  Surgeon: Pasty Spillersahiliani, Varnita B, MD;  [facility];  Service: Endoscopy;  Laterality: N/A;  ? ESOPHAGOGASTRODUODENOSCOPY (EGD) WITH PROPOFOL N/A 11/14/2018  ? Procedure: ESOPHAGOGASTRODUODENOSCOPY (EGD) WITH PROPOFOL;  Surgeon: Pasty Spillersahiliani, Varnita B, MD;  [facility];  Service: Endoscopy;  Laterality: N/A;  Latex allergy  ? GIVENS CAPSULE STUDY N/A 12/27/2018  ? Procedure: GIVENS CAPSULE STUDY;  Surgeon: Pasty Spillersahiliani, Varnita B, MD;  Location: [facility];  Service: Endoscopy;  Laterality: N/A;  ? ? ?Family Psychiatric History: Please see initial evaluation for full details. I have reviewed the history. No updates at this time.  ?  ? ?Family History:  ?Family  History  ?Problem Relation Age of Onset  ? Heart attack Mother [id number]  ? Hypertension Mother   ? Asthma Mother   ? Cancer Mother   ?     laryngeal  ? Diabetes Mother   ?     pre diabetic  ? Heart disease Mother   ? Mental illness Mother   ? Alcohol abuse Mother   ? Drug abuse Mother   ? Depression Mother   ? Anxiety disorder Mother   ? Bipolar disorder Mother   ? Learning disabilities Brother   ? ADD / ADHD Brother   ? Asthma Son   ? Hyperlipidemia Brother   ? Alcohol abuse Father   ? Heart disease Maternal Grandmother   ?     heart attack, pacemaker  ? Heart attack Maternal Grandmother   ? Hypertension Maternal Grandmother   ? Hypertension Maternal Grandfather   ? Heart disease Paternal Grandmother   ?     couple of major open heart surgeries, leaking valves  ? Hypertension Paternal Grandmother   ? Hypertension Paternal Grandfather   ? Breast cancer Neg Hx   ? ? ?Social History:  ?Social History  ? ?Socioeconomic History  ? Marital status: Single  ?  Spouse name: Not on file  ? Number of children: 2  ? Years of education: 7911  ? Highest education level: High school graduate  ?Occupational History  ? Not on file  ?Tobacco Use  ? Smoking status: Never  ? Smokeless tobacco: Never  ?Vaping Use  ? Vaping Use: Never used  ?Substance and Sexual Activity  ? Alcohol use: No  ?  Alcohol/week: 0.0 standard drinks  ? Drug use: No  ? Sexual activity: Yes  ?  Partners: Male  ?  Birth control/protection: None  ?Other Topics Concern  ? Not on file  ?Social History Narrative  ? Not on file  ? ?Social Determinants of Health  ? ?Financial Resource Strain: Not on file  ?Food Insecurity: Not on file  ?Transportation Needs: Not on file  ?Physical Activity: Not on file  ?Stress: Not on file  ?Social Connections: Not on file  ? ? ?Allergies:  ?Allergies  ?Allergen Reactions  ? Latex Hives  ?  (Balloons, condoms, underwear elastic, gloves)  ? Shellfish Allergy Hives  ? ? ?Metabolic Disorder Labs: ?Lab Results  ?Component Value Date  ? HGBA1C  5.2 07/17/2020  ? MPG 103 07/17/2020  ? MPG 105 01/05/2019  ? ?No results found for: PROLACTIN ?Lab Results  ?Component Value Date  ? CHOL 113 07/17/2020  ? TRIG 53 07/17/2020  ? HDL  57 07/17/2020  ? CHOLHDL 2.0 07/17/2020  ? LDLCALC 43 07/17/2020  ? LDLCALC 38 01/05/2019  ? ?Lab Results  ?Component Value Date  ? TSH 4.13 07/17/2020  ? TSH 4.00 10/31/2019  ? ? ?Therapeutic Level Labs: ?No results found for: LITHIUM ?No results found for: VALPROATE ?No components found for:  CBMZ ? ?Current Medications: ?Current Outpatient Medications  ?Medication Sig Dispense Refill  ? albuterol (VENTOLIN HFA) 108 (90 Base) MCG/ACT inhaler Inhale 2 puffs into the lungs every 6 (six) hours as needed for wheezing or shortness of breath. 18 g 1  ? Azelastine HCl 137 MCG/SPRAY SOLN SPRAY TWO SPRAYS IN EACH NOSTRIL TWICE DAILY. 30 mL 1  ? buPROPion (WELLBUTRIN XL) 150 MG 24 hr tablet 150 mg daily for one day, then 300 mg daily for one day, then 450 mg daily 90 tablet 1  ? clobetasol (TEMOVATE) 0.05 % external solution Apply 1 application topically 2 (two) times daily. 50 mL 0  ? DULoxetine (CYMBALTA) 30 MG capsule 30 mg daily for one day, then 60 mg daily for one day, then 90 mg daily 90 capsule 1  ? etonogestrel (NEXPLANON) 68 MG IMPL implant Inject 68 mg into the skin once.    ? gabapentin (NEURONTIN) 300 MG capsule Take 1 capsule (300 mg total) by mouth 3 (three) times daily. 90 capsule 3  ? levothyroxine (SYNTHROID) 75 MCG tablet Take 75 mcg by mouth daily before breakfast.    ? mometasone-formoterol (DULERA) 200-5 MCG/ACT AERO Inhale 2 puffs into the lungs in the morning and at bedtime. 1 each 11  ? montelukast (SINGULAIR) 10 MG tablet Take 1 tablet (10 mg total) by mouth at bedtime. 90 tablet 1  ? Vitamin D, Ergocalciferol, (DRISDOL) 1.25 MG (50000 UNIT) CAPS capsule Take 1 capsule (50,000 Units total) by mouth every 7 (seven) days. 12 capsule 1  ? ?No current facility-administered medications for this visit.   ? ? ? ?Musculoskeletal: ?Strength & Muscle Tone: within normal limits ?Gait & Station: normal ?Patient leans: N/A ? ?Psychiatric Specialty Exam: ?Review of Systems  ?Psychiatric/Behavioral:  Positive for decreased concentration

## 2021-09-09 ENCOUNTER — Telehealth: Payer: Self-pay

## 2021-09-09 ENCOUNTER — Ambulatory Visit (INDEPENDENT_AMBULATORY_CARE_PROVIDER_SITE_OTHER): Payer: Self-pay | Admitting: Psychiatry

## 2021-09-09 ENCOUNTER — Encounter: Payer: Self-pay | Admitting: Psychiatry

## 2021-09-09 VITALS — BP 124/83 | HR 80 | Temp 98.1°F | Wt 228.8 lb

## 2021-09-09 DIAGNOSIS — F331 Major depressive disorder, recurrent, moderate: Secondary | ICD-10-CM

## 2021-09-09 DIAGNOSIS — F4310 Post-traumatic stress disorder, unspecified: Secondary | ICD-10-CM

## 2021-09-09 DIAGNOSIS — F411 Generalized anxiety disorder: Secondary | ICD-10-CM

## 2021-09-09 NOTE — Telephone Encounter (Signed)
per Dr. Vanetta Shawl patient lost her job, and insurance. unable to afford medication-called patient and gave information for [facility] and [facility].  ?

## 2021-10-01 ENCOUNTER — Ambulatory Visit: Payer: Managed Care, Other (non HMO) | Admitting: Psychology

## 2021-10-24 ENCOUNTER — Encounter: Payer: Self-pay | Admitting: Family Medicine

## 2021-11-12 NOTE — Progress Notes (Signed)
Name: Catherine Berg   MRN: [id number]    DOB: 03/28/1981   Date:11/17/2021       Progress Note  Subjective  Chief Complaint  Follow Up  HPI  COVID-19 long haul : Catherine Berg had [place] 01/2020 and is still struggling with post-viral fatigue, cognitive decline, lower extremity pain/neuropathy, joint aches, post-exercise fatigue, SOB with activity, expressive aphasia, change of sense of smell is still not back to normal  . She depression prior to [place] she is now having PTSD and worsening of her depression, she is seeing psychiatrist , currently not seeing a therapist due to cost, but waiting to go to [facility]  She was seen by  multiple doctors: Pulmonologist, Rheumatologist, Psychiatrist, Neurologist, PT, OT now also PMR doctors, however no longer has insurance but still seeing PMR -Dr. Ranell Patrick and also psychiatrist Dr. Modesta Messing.   Asthma Mild intermittent: she takes singulair but currently cannot afford Dulera due to lack of insurance. She has not been back to see Dr. Mortimer Fries, she has intermittent SOB. No cough or wheezing a this time  Perennial AR: she states symptoms are stable, she has nasal congestion at night , she has been using her nasal spray   Vitamin D deficiency: she is taking weekly vitamin D   B12 deficiency: reminded her to get SL B12 since last level was low and she has fatigue plus neuropathy   Morbid Obesity: weight has been stable, BMI is above 40, she does not have insurance at this time and cannot afford medication.   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   COVID-19 long hauler manifesting chronic dyspnea 07/02/2020   Vitamin D deficiency 04/22/2020   COVID-19 long hauler manifesting chronic fatigue 04/22/2020   History of COVID-19 02/28/2020   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   Memory loss 02/28/2020   Physical  deconditioning 02/28/2020   Dyspnea on exertion 02/28/2020   Neck pain, bilateral 02/28/2020   Muscle spasms of neck 02/28/2020   Arthralgia of hand 01/24/2019   CRP elevated 01/24/2019   ESR raised 01/24/2019   Class 3 severe obesity due to excess calories without serious comorbidity in adult [facility]) 01/24/2019   Palpitations 09/16/2018   Coronary artery calcification 09/16/2018   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   Bilateral leg edema 04/20/2017   Incidental lung nodule, > 84m and < 861m11/28/2018   Hives 09/02/2015   Morbid obesity ([place]04/13/2017   Anxiety 07/09/2015   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 06/28/2015   Thyroid nodule 02/01/2015   Vitamin B12 deficiency    History of abnormal cervical Pap smear    Allergy-induced asthma, mild intermittent, uncomplicated 0670/48/8891  Past Surgical History:  Procedure Laterality Date   CERVICAL POLYPECTOMY     CESAREAN SECTION     x 2   COLONOSCOPY WITH PROPOFOL N/A 11/14/2018   Procedure: COLONOSCOPY WITH PROPOFOL;  Surgeon: TaVirgel ManifoldMD;  [facility];  Laterality: N/A;   ESOPHAGOGASTRODUODENOSCOPY (EGD) WITH PROPOFOL N/A 11/14/2018   Procedure: ESOPHAGOGASTRODUODENOSCOPY (EGD) WITH PROPOFOL;  Surgeon: TaVirgel ManifoldMD;  [place] [facility];  Laterality: N/A;  Latex allergy   GIVENS CAPSULE STUDY N/A 12/27/2018   Procedure: GIVENS CAPSULE STUDY;  Surgeon: TaVirgel ManifoldMD;  Location: [facility] END[facility]COPY;  Service: Endoscopy;  Laterality: N/A;    Family  History  Problem Relation Age of Onset   Heart attack Mother 48   Hypertension Mother    Asthma Mother    Cancer Mother        laryngeal   Diabetes Mother        pre diabetic   Heart disease Mother    Mental illness Mother    Alcohol abuse Mother    Drug abuse Mother    Depression Mother    Anxiety disorder Mother    Bipolar disorder Mother    Learning disabilities Brother    ADD  / ADHD Brother    Asthma Son    Hyperlipidemia Brother    Alcohol abuse Father    Heart disease Maternal Grandmother        heart attack, pacemaker   Heart attack Maternal Grandmother    Hypertension Maternal Grandmother    Hypertension Maternal Grandfather    Heart disease Paternal Grandmother        couple of major open heart surgeries, leaking valves   Hypertension Paternal Grandmother    Hypertension Paternal Grandfather    Breast cancer Neg Hx     Social History   Tobacco Use   Smoking status: Never   Smokeless tobacco: Never  Substance Use Topics   Alcohol use: No    Alcohol/week: 0.0 standard drinks of alcohol     Current Outpatient Medications:    albuterol (VENTOLIN HFA) 108 (90 Base) MCG/ACT inhaler, Inhale 2 puffs into the lungs every 6 (six) hours as needed for wheezing or shortness of breath., Disp: 18 g, Rfl: 1   Azelastine HCl 137 MCG/SPRAY SOLN, SPRAY TWO SPRAYS IN EACH NOSTRIL TWICE DAILY., Disp: 30 mL, Rfl: 1   buPROPion (WELLBUTRIN XL) 150 MG 24 hr tablet, 150 mg daily for one day, then 300 mg daily for one day, then 450 mg daily, Disp: 90 tablet, Rfl: 1   clobetasol (TEMOVATE) 0.05 % external solution, Apply 1 application topically 2 (two) times daily., Disp: 50 mL, Rfl: 0   DULoxetine (CYMBALTA) 30 MG capsule, 30 mg daily for one day, then 60 mg daily for one day, then 90 mg daily, Disp: 90 capsule, Rfl: 1   etonogestrel (NEXPLANON) 68 MG IMPL implant, Inject 68 mg into the skin once., Disp: , Rfl:    gabapentin (NEURONTIN) 300 MG capsule, Take 1 capsule (300 mg total) by mouth 3 (three) times daily., Disp: 90 capsule, Rfl: 3   levothyroxine (SYNTHROID) 75 MCG tablet, Take 75 mcg by mouth daily before breakfast., Disp: , Rfl:    mometasone-formoterol (DULERA) 200-5 MCG/ACT AERO, Inhale 2 puffs into the lungs in the morning and at bedtime., Disp: 1 each, Rfl: 11   montelukast (SINGULAIR) 10 MG tablet, Take 1 tablet (10 mg total) by mouth at bedtime., Disp: 90  tablet, Rfl: 1   Vitamin D, Ergocalciferol, (DRISDOL) 1.25 MG (50000 UNIT) CAPS capsule, Take 1 capsule (50,000 Units total) by mouth every 7 (seven) days., Disp: 12 capsule, Rfl: 1  Allergies  Allergen Reactions   Latex Hives    (Balloons, condoms, underwear elastic, gloves)   Shellfish Allergy Hives    I personally reviewed active problem list, medication list, allergies, family history, social history, health maintenance with the patient/caregiver today.   ROS  Constitutional: Negative for fever or weight change.  Respiratory: Negative for cough and shortness of breath.   Cardiovascular: Negative for chest pain or palpitations.  Gastrointestinal: Negative for abdominal pain, no bowel changes.  Musculoskeletal: Negative for gait problem  or joint swelling.  Skin: Negative for rash.  Neurological: Negative for dizziness or headache.  No other specific complaints in a complete review of systems (except as listed in HPI above).   Objective  Vitals:   11/17/21 1308  BP: 118/78  Pulse: 96  Resp: 16  SpO2: 99%  Weight: 228 lb (103.4 kg)  Height: _0  (1.6 m)    Body mass index is 40.39 kg/m.  Physical Exam  Constitutional: Patient appears well-developed and well-nourished. Obese  No distress.  HEENT: head atraumatic, normocephalic, pupils equal and reactive to light, neck supple Cardiovascular: Normal rate, regular rhythm and normal heart sounds.  No murmur heard. No BLE edema. Pulmonary/Chest: Effort normal and breath sounds normal. No respiratory distress. Abdominal: Soft.  There is no tenderness. Psychiatric: Patient has a normal mood and affect. behavior is normal. Judgment and thought content normal.   PHQ2/9:    11/17/2021    1:08 PM 09/09/2021   11:32 AM 06/17/2021    3:00 PM 06/10/2021    4:10 PM 05/19/2021    1:36 PM  Depression screen PHQ 2/9  Decreased Interest _1 Down, Depressed, Hopeless _2 PHQ - 2 Score _3 Altered sleeping 0    1   Tired, decreased energy 2    1  Change in appetite 0    0  Feeling bad or failure about yourself  1    2  Trouble concentrating 0    0  Moving slowly or fidgety/restless 0    0  Suicidal thoughts 0    0  PHQ-9 Score 6    7  Difficult doing work/chores          Information is confidential and restricted. Go to Review Flowsheets to unlock data.    phq 9 is positive   Fall Risk:    11/17/2021    1:08 PM 06/17/2021    3:00 PM 05/19/2021    1:36 PM 01/23/2021   11:54 AM 12/26/2020   11:27 AM  Fall Risk   Falls in the past year? 1 0 0 0 0  Number falls in past yr: 0 0 0  0  Injury with Fall? 0 0 0  0  Risk for fall due to : No Fall Risks  No Fall Risks  No Fall Risks  Follow up Falls prevention discussed  Falls prevention discussed  Falls prevention discussed      Functional Status Survey: Is the patient deaf or have difficulty hearing?: No Does the patient have difficulty seeing, even when wearing glasses/contacts?: Yes Does the patient have difficulty concentrating, remembering, or making decisions?: Yes Does the patient have difficulty walking or climbing stairs?: Yes Does the patient have difficulty dressing or bathing?: No Does the patient have difficulty doing errands alone such as visiting a doctor's office or shopping?: No    Assessment & Plan  1. COVID-19 long hauler  Still having symptoms   2. Morbid obesity ([place])  Discussed with the patient the risk posed by an increased BMI. Discussed importance of portion control, calorie counting and at least 150 minutes of physical activity weekly. Avoid sweet beverages and drink more water. Eat at least 6 servings of fruit and vegetables daily    3. MDD (major depressive disorder), recurrent episode, mild ([place])   4. Neuropathy involving both lower extremities   5. Vitamin D deficiency disease   6. Vitamin B12 deficiency  -  Cyanocobalamin (B-12) 1000 MCG SUBL; Place 1 tablet under the tongue daily.  Dispense: 90  tablet; Refill: 1  7. Mild intermittent asthma without complication  - montelukast (SINGULAIR) 10 MG tablet; Take 1 tablet (10 mg total) by mouth at bedtime.  Dispense: 90 tablet; Refill: 1  8. Long-term use of high-risk medication  - COMPLETE METABOLIC PANEL WITH GFR  9. Postablative hypothyroidism  - TSH  10. Physical deconditioning   11. Perennial allergic rhinitis with seasonal variation  - montelukast (SINGULAIR) 10 MG tablet; Take 1 tablet (10 mg total) by mouth at bedtime.  Dispense: 90 tablet; Refill: 1

## 2021-11-17 ENCOUNTER — Encounter: Payer: Self-pay | Admitting: Family Medicine

## 2021-11-17 ENCOUNTER — Ambulatory Visit: Payer: Self-pay | Admitting: Family Medicine

## 2021-11-17 VITALS — BP 118/78 | HR 96 | Resp 16 | Ht 63.0 in | Wt 228.0 lb

## 2021-11-17 DIAGNOSIS — Z79899 Other long term (current) drug therapy: Secondary | ICD-10-CM

## 2021-11-17 DIAGNOSIS — U099 Post covid-19 condition, unspecified: Secondary | ICD-10-CM

## 2021-11-17 DIAGNOSIS — J4520 Mild intermittent asthma, uncomplicated: Secondary | ICD-10-CM

## 2021-11-17 DIAGNOSIS — F330 Major depressive disorder, recurrent, mild: Secondary | ICD-10-CM

## 2021-11-17 DIAGNOSIS — G5793 Unspecified mononeuropathy of bilateral lower limbs: Secondary | ICD-10-CM

## 2021-11-17 DIAGNOSIS — J3089 Other allergic rhinitis: Secondary | ICD-10-CM

## 2021-11-17 DIAGNOSIS — E890 Postprocedural hypothyroidism: Secondary | ICD-10-CM

## 2021-11-17 DIAGNOSIS — E538 Deficiency of other specified B group vitamins: Secondary | ICD-10-CM

## 2021-11-17 DIAGNOSIS — J302 Other seasonal allergic rhinitis: Secondary | ICD-10-CM | POA: Insufficient documentation

## 2021-11-17 DIAGNOSIS — E559 Vitamin D deficiency, unspecified: Secondary | ICD-10-CM

## 2021-11-17 DIAGNOSIS — R5381 Other malaise: Secondary | ICD-10-CM

## 2021-11-17 MED ORDER — B-12 1000 MCG SL SUBL: 1.0000 | SUBLINGUAL_TABLET | Freq: Every day | SUBLINGUAL | 1 refills | Status: DC

## 2021-11-17 MED ORDER — MONTELUKAST SODIUM 10 MG PO TABS: 10.0000 mg | ORAL_TABLET | Freq: Every day | ORAL | 1 refills | Status: DC

## 2021-11-18 ENCOUNTER — Encounter: Payer: Self-pay | Admitting: Family Medicine

## 2021-11-18 LAB — COMPLETE METABOLIC PANEL WITH GFR
AG Ratio: 1.1 (calc) (ref 1.0–2.5)
ALT: 16 U/L (ref 6–29)
AST: 16 U/L (ref 10–30)
Albumin: 4 g/dL (ref 3.6–5.1)
Alkaline phosphatase (APISO): 64 U/L (ref 31–125)
BUN: 12 mg/dL (ref 7–25)
CO2: 24 mmol/L (ref 20–32)
Calcium: 9.3 mg/dL (ref 8.6–10.2)
Chloride: 104 mmol/L (ref 98–110)
Creat: 0.78 mg/dL (ref 0.50–0.99)
Globulin: 3.5 g/dL (calc) (ref 1.9–3.7)
Glucose, Bld: 83 mg/dL (ref 65–99)
Potassium: 4.6 mmol/L (ref 3.5–5.3)
Sodium: 137 mmol/L (ref 135–146)
Total Bilirubin: 0.4 mg/dL (ref 0.2–1.2)
Total Protein: 7.5 g/dL (ref 6.1–8.1)
eGFR: 98 mL/min/{1.73_m2} (ref >=60)

## 2021-11-18 LAB — TSH: TSH: 3.86 mIU/L

## 2021-11-20 ENCOUNTER — Telehealth: Payer: Self-pay

## 2021-11-20 NOTE — Telephone Encounter (Unsigned)
Copied from CRM #[id number]. Topic: Medical Record Request - Other >> Nov 20, 2021 10:28 AM Tiffany B wrote: Caller requesting a call back from PCP nurse will accept a verbal regarding what patient was seen for between the dates of 02/02/2019-05/04/2019.   If practice decides to fax please indicate on the cover sheet INC # [phone] [id number].   Caller states medical release form is on file 

## 2021-11-20 NOTE — Telephone Encounter (Signed)
Copied from CRM [id number]. Topic: Medical Record Request - Other >> Nov 20, 2021 10:28 AM Elmarie Shiley B wrote: Caller requesting a call back from PCP nurse will accept a verbal regarding what patient was seen for between the dates of 02/02/2019-05/04/2019.   If practice decides to fax please indicate on the cover sheet INC # [phone] [id number].   Caller states medical release form is on file

## 2021-11-21 NOTE — Telephone Encounter (Signed)
Completed.

## 2021-12-16 ENCOUNTER — Encounter: Payer: Self-pay | Admitting: Physical Medicine and Rehabilitation

## 2022-02-03 ENCOUNTER — Encounter
Payer: Medicaid Other | Attending: Physical Medicine and Rehabilitation | Admitting: Physical Medicine and Rehabilitation

## 2022-02-03 VITALS — BP 132/84 | HR 79 | Ht 63.0 in | Wt 229.2 lb

## 2022-02-03 DIAGNOSIS — G609 Hereditary and idiopathic neuropathy, unspecified: Secondary | ICD-10-CM

## 2022-02-03 DIAGNOSIS — G4701 Insomnia due to medical condition: Secondary | ICD-10-CM

## 2022-02-03 DIAGNOSIS — M25571 Pain in right ankle and joints of right foot: Secondary | ICD-10-CM

## 2022-02-03 DIAGNOSIS — R410 Disorientation, unspecified: Secondary | ICD-10-CM

## 2022-02-03 DIAGNOSIS — M25572 Pain in left ankle and joints of left foot: Secondary | ICD-10-CM | POA: Insufficient documentation

## 2022-02-03 MED ORDER — TOPIRAMATE 25 MG PO TABS: 25.0000 mg | ORAL_TABLET | Freq: Every evening | ORAL | 3 refills | Status: DC

## 2022-02-03 NOTE — Progress Notes (Signed)
Subjective:    Patient ID: Catherine Berg, female    DOB: [birth year], 41 y.o.   MRN: [id number]  HPI Mrs. 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 It makes her sleepy.  -she has tried a couple of things but nothing has helped -pain feels burning  -extends from her thighs to ankle -she tries to rest her legs so they do not hurt as badly at night.  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  -the lack of control really bothers her -some days this really gets to her -she forgets to pay bills if she does not do it right away.  -she feels that not having insurance has put her in behind from getting better -she talked to her brother on the way up here.   2) confusion:  -it is embarrassing to her -she did speech language therapy, she has not been able to do any more because of lack of insurance -she holds back on her medicine when she notes it affecting her cognition -she feels this is one of her top challenges daily.  -she had been out of work for a long time before she came to see me -they put it in that she resigned her work so she lost her benefits.  -she saw Dr. Sima Matas.   3) Peripheral neuropathy -she does not have insurance so has not had any medication since she cannot afford it -she is hoping for medicaid expansion in December -she gets long term disability through her job, but SSI was denied- she is hoping they will change her mind. If she could get this she would get medicaid too. 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 Because she has assets (she has a home, she has a car, and her son has a car), they make it difficult for her to get any services.   4) Bilateral ankle pain   Pain Inventory Average Pain  8 Pain Right Now 6 My pain is dull and aching  In the last 24 hours, has pain interfered with the following? General activity 5 Relation with others 4 Enjoyment of life 9 What TIME of day is your pain at its worst? evening and night Sleep (in general) Fair  Pain is worse with: walking Pain improves with: medication Relief from Meds: 2    Family History  Problem Relation Age of Onset   Heart attack Mother 66   Hypertension Mother    Asthma Mother    Cancer Mother        laryngeal   Diabetes Mother        pre diabetic   Heart disease Mother    Mental illness Mother    Alcohol abuse Mother    Drug abuse Mother    Depression Mother    Anxiety disorder Mother    Bipolar disorder Mother    Learning disabilities Brother    ADD / ADHD Brother    Asthma Son    Hyperlipidemia Brother    Alcohol abuse Father    Heart disease Maternal Grandmother        heart attack, pacemaker   Heart attack Maternal Grandmother    Hypertension Maternal Grandmother    Hypertension Maternal  Grandfather    Heart disease Paternal Grandmother        couple of major open heart surgeries, leaking valves   Hypertension Paternal Grandmother    Hypertension Paternal Grandfather    Breast cancer Neg Hx    Social History   Socioeconomic History   Marital status: Single    Spouse name: Not on file   Number of children: 2   Years of education: 11   Highest education level: High school graduate  Occupational History   Not on file  Tobacco Use   Smoking status: Never   Smokeless tobacco: Never  Vaping Use   Vaping Use: Never used  Substance and Sexual Activity   Alcohol use: No    Alcohol/week: 0.0 standard drinks of alcohol   Drug use: No   Sexual activity: Yes    Partners: Male    Birth control/protection: None  Other Topics Concern   Not on file  Social History Narrative   Not on file   Social Determinants of Health   Financial Resource Strain: Low Risk  (06/02/2018)   Overall  Financial Resource Strain (CARDIA)    Difficulty of Paying Living Expenses: Not hard at all  Food Insecurity: No Food Insecurity (06/02/2018)   Hunger Vital Sign    Worried About Running Out of Food in the Last Year: Never true    Ran Out of Food in the Last Year: Never true  Transportation Needs: No Transportation Needs (06/02/2018)   PRAPARE - Administrator, Civil Service (Medical): No    Lack of Transportation (Non-Medical): No  Physical Activity: Sufficiently Active (06/02/2018)   Exercise Vital Sign    Days of Exercise per Week: 5 days    Minutes of Exercise per Session: 150+ min  Stress: Stress Concern Present (06/02/2018)   [employer] of Occupational Health - Occupational Stress Questionnaire    Feeling of Stress : Rather much  Social Connections: Moderately Integrated (06/02/2018)   Social Connection and Isolation Panel [NHANES]    Frequency of Communication with Friends and Family: More than three times a week    Frequency of Social Gatherings with Friends and Family: More than three times a week    Attends Religious Services: More than 4 times per year    Active Member of [employer] or Organizations: Yes    Attends Banker Meetings: More than 4 times per year    Marital Status: Never married   Past Surgical History:  Procedure Laterality Date   CERVICAL POLYPECTOMY     CESAREAN SECTION     x 2   COLONOSCOPY WITH PROPOFOL N/A 11/14/2018   Procedure: COLONOSCOPY WITH PROPOFOL;  Surgeon: Pasty Spillers, MD;  Location: [facility];  Service: Endoscopy;  Laterality: N/A;   ESOPHAGOGASTRODUODENOSCOPY (EGD) WITH PROPOFOL N/A 11/14/2018   Procedure: ESOPHAGOGASTRODUODENOSCOPY (EGD) WITH PROPOFOL;  Surgeon: Pasty Spillers, MD;  Location: [facility];  Service: Endoscopy;  Laterality: N/A;  Latex allergy   GIVENS CAPSULE STUDY N/A 12/27/2018   Procedure: GIVENS CAPSULE STUDY;  Surgeon: Pasty Spillers, MD;  Location: [facility]  ENDOSCOPY;  Service: Endoscopy;  Laterality: N/A;   Past Medical History:  Diagnosis Date   Abnormal thyroid blood test    Anemia    Anxiety    Asthma    Complication of anesthesia    itching after c sections   COVID-19 01/2020   Depression    Elevated serum glutamic pyruvic transaminase (SGPT) level  Graves disease    History of abnormal cervical Pap smear    History of cervical polypectomy    Hives 09/02/2015   Hypertension    Hypothyroidism    IFG (impaired fasting glucose)    Incidental lung nodule, > 25mm and < 30mm 03/31/2017   4 mm RLL lung nodule on chest CT Mar 31, 2017   Low serum vitamin D    Obesity    PONV (postoperative nausea and vomiting)    after c section had vomit   Pregnancy induced hypertension    with 1st pregnancy, normal pressure with 2nd   Reflux    Thyroid disease    Vitamin B12 deficiency    Vitamin D deficiency disease    Wears dentures    full upper   BP 132/84   Pulse 79   Ht 5\' 3"  (1.6 m)   Wt 229 lb 3.2 oz (104 kg)   SpO2 97%   BMI 40.60 kg/m   Opioid Risk Score:   Fall Risk Score:  `1  Depression screen PHQ 2/9     11/17/2021    1:08 PM 09/09/2021   11:32 AM 06/17/2021    3:00 PM 06/10/2021    4:10 PM 05/19/2021    1:36 PM 01/23/2021   11:54 AM 12/27/2020    9:45 AM  Depression screen PHQ 2/9  Decreased Interest 1  1  2 2    Down, Depressed, Hopeless 2  1  1 1    PHQ - 2 Score 3  2  3 3    Altered sleeping 0    1 2   Tired, decreased energy 2    1 3    Change in appetite 0    0 0   Feeling bad or failure about yourself  1    2 3    Trouble concentrating 0    0 1   Moving slowly or fidgety/restless 0    0 0   Suicidal thoughts 0    0 0   PHQ-9 Score 6    7 12    Difficult doing work/chores      Somewhat difficult      Information is confidential and restricted. Go to Review Flowsheets to unlock data.       Review of Systems  Constitutional: Negative.   HENT: Negative.    Eyes: Negative.   Respiratory: Negative.     Cardiovascular: Negative.   Gastrointestinal: Negative.   Endocrine: Negative.   Genitourinary: Negative.   Musculoskeletal: Negative.        Pain from thighs to lower legs in both legs  Skin: Negative.   Allergic/Immunologic: Negative.   Neurological: Negative.   Hematological: Negative.   Psychiatric/Behavioral:  Positive for dysphoric mood. Negative for confusion. The patient is not nervous/anxious.   All other systems reviewed and are negative.      Objective:   Physical Exam Gen: no distress, normal appearing HEENT: oral mucosa pink and moist, NCAT Cardio: Reg rate Chest: normal effort, normal rate of breathing Abd: soft, non-distended Ext: no edema Psych: pleasant, normal affect Skin: intact Neuro: Alert and oriented x3. Cognitive impairments, slow thought processing.      Assessment & Plan:  Mrs. 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  -discussed her response to ibuprofen and tylenol -Discussed benefits of exercise in reducing  pain. -Continue gabapentin to 300mg  TID PRN. Discussed that this helped.  -completing disability paperwork for her -prescribed topamax 25mg  HS  -prescribed low dose naltrexone in the past but she currently does not have insurance.  -Provided with a pain relief journal and discussed that it contains foods and lifestyle tips to naturally help to improve pain. Discussed that these lifestyle strategies are also very good for health unlike some medications which can have negative side effects. Discussed that the act of keeping a journal can be therapeutic and helpful to realize patterns what helps to trigger and alleviate pain.   -Discussed following foods that may reduce pain: 1) Ginger (especially studied for arthritis)- reduce leukotriene production to decrease  inflammation 2) Blueberries- high in phytonutrients that decrease inflammation 3) Salmon- marine omega-3s reduce joint swelling and pain 4) Pumpkin seeds- reduce inflammation 5) dark chocolate- reduces inflammation 6) turmeric- reduces inflammation 7) tart cherries - reduce pain and stiffness 8) extra virgin olive oil - its compound olecanthal helps to block prostaglandins  9) chili peppers- can be eaten or applied topically via capsaicin 10) mint- helpful for headache, muscle aches, joint pain, and itching 11) garlic- reduces inflammation  Link to further information on diet for chronic pain:    2) Expressive aphasia -very embarrassing to her -continue SLP HEP -continue to follow with neuropsych once she has Medicaid to pay for their appointments -discussed her conversation with Dr. which was very helpful for her.   3) Impaired memory -discussed how difficult it is for her as other people cannot see this -discussed that her family is supportive.   4) Insomnia: -Try to go outside near sunrise -Get exercise during the day.  -Turn off all devices an hour before bedtime.  -Teas that can benefit: chamomile, valerian root, Brahmi (Bacopa) -Can consider over the counter melatonin, magnesium, and/or L-theanine. Melatonin is an anti-oxidant with multiple health benefits. Magnesium is involved in greater than 300 enzymatic reactions in the body and most of http://www.bray.com/ are deficient as our soil is often depleted. There are 7 different types of magnesium- Bioptemizer's is a supplement with all 7 types, and each has unique benefits. Magnesium can also help with constipation and anxiety.  -Pistachios naturally increase the production of melatonin -Cozy Earth bamboo bed sheets are free from toxic chemicals.  -Tart cherry juice or a tart cherry supplement can improve sleep and soreness post-workout

## 2022-02-03 NOTE — Patient Instructions (Signed)

## 2022-02-11 ENCOUNTER — Encounter: Payer: Self-pay | Admitting: Family Medicine

## 2022-02-14 ENCOUNTER — Other Ambulatory Visit: Payer: Self-pay

## 2022-02-14 ENCOUNTER — Emergency Department: Payer: Medicaid Other

## 2022-02-14 ENCOUNTER — Encounter: Payer: Self-pay | Admitting: Emergency Medicine

## 2022-02-14 DIAGNOSIS — R0602 Shortness of breath: Secondary | ICD-10-CM | POA: Insufficient documentation

## 2022-02-14 DIAGNOSIS — Z5321 Procedure and treatment not carried out due to patient leaving prior to being seen by health care provider: Secondary | ICD-10-CM | POA: Insufficient documentation

## 2022-02-14 DIAGNOSIS — R0789 Other chest pain: Secondary | ICD-10-CM | POA: Insufficient documentation

## 2022-02-14 DIAGNOSIS — Z7951 Long term (current) use of inhaled steroids: Secondary | ICD-10-CM | POA: Insufficient documentation

## 2022-02-14 DIAGNOSIS — J45909 Unspecified asthma, uncomplicated: Secondary | ICD-10-CM | POA: Insufficient documentation

## 2022-02-14 DIAGNOSIS — Z20822 Contact with and (suspected) exposure to covid-19: Secondary | ICD-10-CM | POA: Insufficient documentation

## 2022-02-14 LAB — COMPREHENSIVE METABOLIC PANEL
ALT: 19 U/L (ref 0–44)
AST: 20 U/L (ref 15–41)
Albumin: 3.7 g/dL (ref 3.5–5.0)
Alkaline Phosphatase: 59 U/L (ref 38–126)
Anion gap: 7 (ref 5–15)
BUN: 13 mg/dL (ref 6–20)
CO2: 25 mmol/L (ref 22–32)
Calcium: 9.2 mg/dL (ref 8.9–10.3)
Chloride: 107 mmol/L (ref 98–111)
Creatinine, Ser: 0.9 mg/dL (ref 0.44–1.00)
GFR, Estimated: >60 mL/min (ref >60)
Glucose, Bld: 86 mg/dL (ref 70–99)
Potassium: 4.2 mmol/L (ref 3.5–5.1)
Sodium: 139 mmol/L (ref 135–145)
Total Bilirubin: 0.4 mg/dL (ref 0.3–1.2)
Total Protein: 7.8 g/dL (ref 6.5–8.1)

## 2022-02-14 LAB — RESP PANEL BY RT-PCR (FLU A&B, COVID) ARPGX2
Influenza A by PCR: NEGATIVE
Influenza B by PCR: NEGATIVE
SARS Coronavirus 2 by RT PCR: NEGATIVE

## 2022-02-14 LAB — TROPONIN I (HIGH SENSITIVITY): Troponin I (High Sensitivity): 4 ng/L (ref <18)

## 2022-02-14 LAB — CBC
HCT: 38.3 % (ref 36.0–46.0)
Hemoglobin: 12.4 g/dL (ref 12.0–15.0)
MCH: 28.7 pg (ref 26.0–34.0)
MCHC: 32.4 g/dL (ref 30.0–36.0)
MCV: 88.7 fL (ref 80.0–100.0)
Platelets: 318 10*3/uL (ref 150–400)
RBC: 4.32 MIL/uL (ref 3.87–5.11)
RDW: 13.3 % (ref 11.5–15.5)
WBC: 7.5 10*3/uL (ref 4.0–10.5)
nRBC: 0 % (ref 0.0–0.2)

## 2022-02-14 NOTE — ED Notes (Signed)
Pt unable to sing MSE signature pad not working

## 2022-02-14 NOTE — ED Triage Notes (Signed)
Pt presents to ER with shortness of breath when she woke up. Pt reports she has a history of asthma. Pt used her inhaler at home with no relief. Pt reports she feels "like she has something in her throat feels the need to cough" Pt talks in complete sentences no respiratory distress noted.

## 2022-02-14 NOTE — ED Provider Triage Note (Signed)
Emergency Medicine Provider Triage Evaluation Note  Catherine Berg , a 41 y.o. female  was evaluated in triage.  Pt complains of shortness of breath today. Chest "discomfort" yesterday. No CP today. Slight scratchy throat. HX asthma and used rescue inhaler x 1 today  Review of Systems  Positive: Shortness of breath, CP yesterday Negative: Fever, chills, cough  Physical Exam  Ht 5\' 3"  (1.6 m)   Wt 107.5 kg   BMI 41.98 kg/m  Gen:   Awake, no distress   Resp:  Normal effort  MSK:   Moves extremities without difficulty  Other:    Medical Decision Making  Medically screening exam initiated at 8:20 PM.  Appropriate orders placed.  [name] was informed that the remainder of the evaluation will be completed by another provider, this initial triage assessment does not replace that evaluation, and the importance of remaining in the ED until their evaluation is complete.  Cardiac workup and covid swab   Darletta Moll, PA-C 02/14/22 2020

## 2022-02-15 ENCOUNTER — Emergency Department
Admission: EM | Admit: 2022-02-15 | Discharge: 2022-02-15 | Discharge status: Against Medical Advice | Payer: Medicaid Other | Attending: Emergency Medicine | Admitting: Emergency Medicine

## 2022-02-18 ENCOUNTER — Encounter: Payer: Self-pay | Admitting: Family Medicine

## 2022-03-03 ENCOUNTER — Telehealth: Payer: Self-pay | Admitting: Psychiatry

## 2022-03-03 NOTE — Telephone Encounter (Addendum)
Received a form to file for LTD from [employer]. She is not seen since May. Please contact them that I will not be able to fill out the form.

## 2022-05-07 ENCOUNTER — Emergency Department: Payer: Medicaid Other

## 2022-05-07 ENCOUNTER — Encounter: Payer: Self-pay | Admitting: Emergency Medicine

## 2022-05-07 DIAGNOSIS — Z20822 Contact with and (suspected) exposure to covid-19: Secondary | ICD-10-CM | POA: Diagnosis not present

## 2022-05-07 DIAGNOSIS — J45909 Unspecified asthma, uncomplicated: Secondary | ICD-10-CM | POA: Insufficient documentation

## 2022-05-07 DIAGNOSIS — R0602 Shortness of breath: Secondary | ICD-10-CM | POA: Diagnosis not present

## 2022-05-07 DIAGNOSIS — Z79899 Other long term (current) drug therapy: Secondary | ICD-10-CM | POA: Diagnosis not present

## 2022-05-07 DIAGNOSIS — R0789 Other chest pain: Secondary | ICD-10-CM | POA: Diagnosis not present

## 2022-05-07 DIAGNOSIS — E039 Hypothyroidism, unspecified: Secondary | ICD-10-CM | POA: Insufficient documentation

## 2022-05-07 DIAGNOSIS — I10 Essential (primary) hypertension: Secondary | ICD-10-CM | POA: Diagnosis not present

## 2022-05-07 LAB — BASIC METABOLIC PANEL
Anion gap: 6 (ref 5–15)
BUN: 16 mg/dL (ref 6–20)
CO2: 24 mmol/L (ref 22–32)
Calcium: 8.9 mg/dL (ref 8.9–10.3)
Chloride: 104 mmol/L (ref 98–111)
Creatinine, Ser: 0.9 mg/dL (ref 0.44–1.00)
GFR, Estimated: >60 mL/min (ref >60)
Glucose, Bld: 115 mg/dL — ABNORMAL HIGH (ref 70–99)
Potassium: 3.6 mmol/L (ref 3.5–5.1)
Sodium: 134 mmol/L — ABNORMAL LOW (ref 135–145)

## 2022-05-07 LAB — CBC
HCT: 37.6 % (ref 36.0–46.0)
Hemoglobin: 12.3 g/dL (ref 12.0–15.0)
MCH: 28.7 pg (ref 26.0–34.0)
MCHC: 32.7 g/dL (ref 30.0–36.0)
MCV: 87.9 fL (ref 80.0–100.0)
Platelets: 320 10*3/uL (ref 150–400)
RBC: 4.28 MIL/uL (ref 3.87–5.11)
RDW: 13.2 % (ref 11.5–15.5)
WBC: 9.2 10*3/uL (ref 4.0–10.5)
nRBC: 0 % (ref 0.0–0.2)

## 2022-05-07 LAB — TROPONIN I (HIGH SENSITIVITY): Troponin I (High Sensitivity): 2 ng/L (ref <18)

## 2022-05-07 NOTE — ED Triage Notes (Addendum)
Pt presents via POV with complaints of SOB that started around 3PM - Hx of asthma and has had to use her rescue inhaler more today. She notes having a dry cough and is unable to take a full breath. No wheezing noted. Denies CP, fevers, chills, N/V/D.   Pt declined respiratory panel at this time.

## 2022-05-08 ENCOUNTER — Emergency Department
Admission: EM | Admit: 2022-05-08 | Discharge: 2022-05-08 | Disposition: A | Discharge status: Home / Self Care | Payer: Medicaid Other | Attending: Emergency Medicine | Admitting: Emergency Medicine

## 2022-05-08 DIAGNOSIS — R0602 Shortness of breath: Secondary | ICD-10-CM

## 2022-05-08 LAB — RESP PANEL BY RT-PCR (RSV, FLU A&B, COVID)  RVPGX2
Influenza A by PCR: NEGATIVE
Influenza B by PCR: NEGATIVE
Resp Syncytial Virus by PCR: NEGATIVE
SARS Coronavirus 2 by RT PCR: NEGATIVE

## 2022-05-08 LAB — TROPONIN I (HIGH SENSITIVITY): Troponin I (High Sensitivity): 3 ng/L (ref <18)

## 2022-05-08 LAB — D-DIMER, QUANTITATIVE: D-Dimer, Quant: 0.28 ug/mL-FEU (ref 0.00–0.50)

## 2022-05-08 MED ORDER — IPRATROPIUM-ALBUTEROL 0.5-2.5 (3) MG/3ML IN SOLN
3.0000 mL | Freq: Once | RESPIRATORY_TRACT | Status: AC
  Administered 2022-05-08: 3 mL via RESPIRATORY_TRACT
  Filled 2022-05-08: qty 3

## 2022-05-08 NOTE — Discharge Instructions (Signed)
Your workup today was reassuring with normal cardiac labs, negative D-dimer, clear chest x-ray, negative COVID, flu and RSV swabs.  I recommend close follow-up with your PCP and pulmonologist.

## 2022-05-08 NOTE — ED Provider Notes (Signed)
[facility] Provider Note    Event Date/Time   First MD Initiated Contact with Patient 05/08/22 0149     (approximate)   History   Shortness of Breath   HPI  Catherine Berg is a 42 y.o. female with history of asthma who presents to the emergency department with complaints of shortness of breath that started today.  Has been using her albuterol inhaler without relief.  States she has had some tightness in her chest but no wheezing, fever, cough.  No history of PE, DVT.  No lower extremity swelling or pain.   History provided by patient.    Past Medical History:  Diagnosis Date   Abnormal thyroid blood test    Anemia    Anxiety    Asthma    Complication of anesthesia    itching after c sections   COVID-19 01/2020   Depression    Elevated serum glutamic pyruvic transaminase (SGPT) level    Graves disease    History of abnormal cervical Pap smear    History of cervical polypectomy    Hives 09/02/2015   Hypertension    Hypothyroidism    IFG (impaired fasting glucose)    Incidental lung nodule, > 15mm and < 7mm 03/31/2017   4 mm RLL lung nodule on chest CT Mar 31, 2017   Low serum vitamin D    Obesity    PONV (postoperative nausea and vomiting)    after c section had vomit   Pregnancy induced hypertension    with 1st pregnancy, normal pressure with 2nd   Reflux    Thyroid disease    Vitamin B12 deficiency    Vitamin D deficiency disease    Wears dentures    full upper    Past Surgical History:  Procedure Laterality Date   CERVICAL POLYPECTOMY     CESAREAN SECTION     x 2   COLONOSCOPY WITH PROPOFOL N/A 11/14/2018   Procedure: COLONOSCOPY WITH PROPOFOL;  Surgeon: Pasty Spillers, MD;  Location: [facility];  Service: Endoscopy;  Laterality: N/A;   ESOPHAGOGASTRODUODENOSCOPY (EGD) WITH PROPOFOL N/A 11/14/2018   Procedure: ESOPHAGOGASTRODUODENOSCOPY (EGD) WITH PROPOFOL;  Surgeon: Pasty Spillers, MD;  Location: [facility];  Service: Endoscopy;  Laterality: N/A;  Latex allergy   GIVENS CAPSULE STUDY N/A 12/27/2018   Procedure: GIVENS CAPSULE STUDY;  Surgeon: Pasty Spillers, MD;  Location: [facility];  Service: Endoscopy;  Laterality: N/A;    MEDICATIONS:  Prior to Admission medications   Medication Sig Start Date End Date Taking? Authorizing Provider  albuterol (VENTOLIN HFA) 108 (90 Base) MCG/ACT inhaler Inhale 2 puffs into the lungs every 6 (six) hours as needed for wheezing or shortness of breath. 05/19/21   [name], MD  Azelastine HCl 137 MCG/SPRAY SOLN SPRAY TWO SPRAYS IN EACH NOSTRIL TWICE DAILY. 05/19/21   [name], MD  buPROPion (WELLBUTRIN XL) 150 MG 24 hr tablet 150 mg daily for one day, then 300 mg daily for one day, then 450 mg daily 07/10/21   [name], MD  clobetasol (TEMOVATE) 0.05 % external solution Apply 1 application topically 2 (two) times daily. 10/31/19   [name], MD  Cyanocobalamin (B-12) 1000 MCG SUBL Place 1 tablet under the tongue daily. 11/17/21   [name], MD  DULoxetine (CYMBALTA) 30 MG capsule 30 mg daily for one day, then 60 mg daily for one day, then 90 mg daily 07/10/21   [name], MD  etonogestrel (  NEXPLANON) 68 MG IMPL implant Inject 68 mg into the skin once.    [provider]  gabapentin (NEURONTIN) 300 MG capsule Take 1 capsule (300 mg total) by mouth 3 (three) times daily. 01/23/21   Izora Ribas, MD  levothyroxine (SYNTHROID) 75 MCG tablet Take 75 mcg by mouth daily before breakfast. 11/13/18   Lonia Farber, MD  mometasone-formoterol (DULERA) 200-5 MCG/ACT AERO Inhale 2 puffs into the lungs in the morning and at bedtime. 09/09/20   Flora Lipps, MD  montelukast (SINGULAIR) 10 MG tablet Take 1 tablet (10 mg total) by mouth at bedtime. 11/17/21   Steele Sizer, MD  topiramate (TOPAMAX) 25 MG tablet Take 1 tablet (25 mg total) by mouth at bedtime. 02/03/22   Raulkar, Clide Deutscher, MD  Vitamin D, Ergocalciferol,  (DRISDOL) 1.25 MG (50000 UNIT) CAPS capsule Take 1 capsule (50,000 Units total) by mouth every 7 (seven) days. 05/19/21   Steele Sizer, MD    Physical Exam   Triage Vital Signs: ED Triage Vitals  Enc Vitals Group     BP 05/07/22 2309 (!) 161/93     Pulse Rate 05/07/22 2309 82     Resp 05/07/22 2309 18     Temp 05/07/22 2309 98.1 F (36.7 C)     Temp Source 05/07/22 2309 Oral     SpO2 05/07/22 2309 100 %     Weight 05/07/22 2306 237 lb (107.5 kg)     Height 05/07/22 2306 5\' 3"  (1.6 m)     Head Circumference --      Peak Flow --      Pain Score 05/07/22 2306 0     Pain Loc --      Pain Edu? --      Excl. in [place]? --     Most recent vital signs: Vitals:   05/08/22 0113 05/08/22 0428  BP: (!) 146/88 (!) 140/81  Pulse: 81 80  Resp: 17 18  Temp:  98.2 F (36.8 C)  SpO2: 100% 100%    CONSTITUTIONAL: Alert and oriented and responds appropriately to questions. Well-appearing; well-nourished HEAD: Normocephalic, atraumatic EYES: Conjunctivae clear, pupils appear equal, sclera nonicteric ENT: normal nose; moist mucous membranes NECK: Supple, normal ROM CARD: RRR; S1 and S2 appreciated; no murmurs, no clicks, no rubs, no gallops RESP: Normal chest excursion without splinting or tachypnea; breath sounds clear and equal bilaterally; no wheezes, no rhonchi, no rales, no hypoxia or respiratory distress, speaking full sentences ABD/GI: Normal bowel sounds; non-distended; soft, non-tender, no rebound, no guarding, no peritoneal signs BACK: The back appears normal EXT: Normal ROM in all joints; no deformity noted, no edema; no cyanosis, no calf tenderness or calf swelling SKIN: Normal color for age and race; warm; no rash on exposed skin NEURO: Moves all extremities equally, normal speech PSYCH: The patient's mood and manner are appropriate.   ED Results / Procedures / Treatments   LABS: (all labs ordered are listed, but only abnormal results are displayed) Labs Reviewed  BASIC  METABOLIC PANEL - Abnormal; Notable for the following components:      Result Value   Sodium 134 (*)    Glucose, Bld 115 (*)    All other components within normal limits  RESP PANEL BY RT-PCR (RSV, FLU A&B, COVID)  [id number]  CBC  D-DIMER, QUANTITATIVE  TROPONIN I (HIGH SENSITIVITY)  TROPONIN I (HIGH SENSITIVITY)     EKG:  EKG Interpretation  Date/Time:  Thursday May 07 2022 23:07:19 EST Ventricular Rate:  75  PR Interval:  160 QRS Duration: 74 QT Interval:  366 QTC Calculation: 408 R Axis:   45 Text Interpretation: Normal sinus rhythm Normal ECG When compared with ECG of 14-Feb-2022 20:28, No significant change was found Confirmed by Pryor Curia [phone]) on 05/08/2022 1:58:55 AM         RADIOLOGY: My personal review and interpretation of imaging: Chest x-ray clear.  I have personally reviewed all radiology reports.   DG Chest Port 1 View  Result Date: 05/07/2022 CLINICAL DATA:  10026 Shortness of breath 10026 EXAM: PORTABLE CHEST 1 VIEW COMPARISON:  Chest x-ray 02/14/2022 FINDINGS: The heart and mediastinal contours are unchanged. No focal consolidation. No pulmonary edema. No pleural effusion. No pneumothorax. No acute osseous abnormality. IMPRESSION: No active disease. Electronically Signed   By: Iven Finn M.D.   On: 05/07/2022 23:37     PROCEDURES:  Critical Care performed: No      Procedures    IMPRESSION / MDM / ASSESSMENT AND PLAN / ED COURSE  I reviewed the triage vital signs and the nursing notes.    Patient here with complaints of chest tightness, shortness of breath.  History of asthma.    DIFFERENTIAL DIAGNOSIS (includes but not limited to):   Asthma, ACS, PE, pneumonia, pneumothorax, doubt dissection, CHF   Patient's presentation is most consistent with acute presentation with potential threat to life or bodily function.   PLAN: Workup initiated from triage.  Normal hemoglobin, electrolytes.  Troponin x 2 negative.  Will add on  D-dimer, COVID and flu swabs.  Chest x-ray clear when reviewed and interpreted by myself and the radiologist.  EKG nonischemic.  Will give breathing treatment and reassess.   MEDICATIONS GIVEN IN ED: Medications  ipratropium-albuterol (DUONEB) 0.5-2.5 (3) MG/3ML nebulizer solution 3 mL (3 mLs Nebulization Given 05/08/22 0244)     ED COURSE: Patient reports feeling better after DuoNeb.  D-dimer negative.  COVID, flu and RSV negative.  Discussed with patient that this could be a mild asthma exacerbation but given she has good aeration without wheezing, will hold off on steroids.  She will follow-up with her PCP and pulmonologist.  At this time, I do not feel there is any life-threatening condition present. I reviewed all nursing notes, vitals, pertinent previous records.  All lab and urine results, EKGs, imaging ordered have been independently reviewed and interpreted by myself.  I reviewed all available radiology reports from any imaging ordered this visit.  Based on my assessment, I feel the patient is safe to be discharged home without further emergent workup and can continue workup as an outpatient as needed. Discussed all findings, treatment plan as well as usual and customary return precautions.  They verbalize understanding and are comfortable with this plan.  Outpatient follow-up has been provided as needed.  All questions have been answered.    CONSULTS:  none   OUTSIDE RECORDS REVIEWED: Reviewed patient's last rheumatology note on 09/12/2020.       FINAL CLINICAL IMPRESSION(S) / ED DIAGNOSES   Final diagnoses:  SOB (shortness of breath)     Rx / DC Orders   ED Discharge Orders     None        Note:  This document was prepared using Dragon voice recognition software and may include unintentional dictation errors.   Ayslin Kundert, Delice Bison, DO 05/08/22 [phone]

## 2022-05-08 NOTE — ED Notes (Signed)
Patient verbalizes understanding of discharge instructions. Opportunity for questioning and answers were provided. Armband removed by staff, pt discharged from ED. Ambulated out to lobby  

## 2022-05-12 ENCOUNTER — Encounter: Payer: Self-pay | Admitting: Adult Health

## 2022-05-12 ENCOUNTER — Ambulatory Visit (INDEPENDENT_AMBULATORY_CARE_PROVIDER_SITE_OTHER): Payer: Medicaid Other | Admitting: Adult Health

## 2022-05-12 VITALS — BP 148/90 | HR 71 | Temp 97.0°F | Ht 63.0 in | Wt 236.0 lb

## 2022-05-12 DIAGNOSIS — J302 Other seasonal allergic rhinitis: Secondary | ICD-10-CM | POA: Diagnosis not present

## 2022-05-12 DIAGNOSIS — J4540 Moderate persistent asthma, uncomplicated: Secondary | ICD-10-CM

## 2022-05-12 DIAGNOSIS — J3089 Other allergic rhinitis: Secondary | ICD-10-CM

## 2022-05-12 DIAGNOSIS — J4520 Mild intermittent asthma, uncomplicated: Secondary | ICD-10-CM | POA: Diagnosis not present

## 2022-05-12 DIAGNOSIS — R0609 Other forms of dyspnea: Secondary | ICD-10-CM | POA: Diagnosis not present

## 2022-05-12 DIAGNOSIS — J45909 Unspecified asthma, uncomplicated: Secondary | ICD-10-CM | POA: Insufficient documentation

## 2022-05-12 LAB — NITRIC OXIDE: Nitric Oxide: 18

## 2022-05-12 MED ORDER — ALBUTEROL SULFATE HFA 108 (90 BASE) MCG/ACT IN AERS: 2.0000 | INHALATION_SPRAY | Freq: Four times a day (QID) | RESPIRATORY_TRACT | 1 refills | Status: DC | PRN

## 2022-05-12 MED ORDER — MONTELUKAST SODIUM 10 MG PO TABS: 10.0000 mg | ORAL_TABLET | Freq: Every day | ORAL | 1 refills | Status: DC

## 2022-05-12 MED ORDER — DULERA 200-5 MCG/ACT IN AERO: 2.0000 | INHALATION_SPRAY | Freq: Two times a day (BID) | RESPIRATORY_TRACT | 11 refills | Status: DC

## 2022-05-12 NOTE — Patient Instructions (Addendum)
Restart Dulera 2 puffs Twice daily, rinse after use.  Albuterol inhaler As needed   Asthma action plan as discussed  Restart Singulair 10mg  daily  Continue on Prilosec 20mg  daily .  Follow up with Dr. Mortimer Fries in 6-8 weeks with PFT and As needed

## 2022-05-12 NOTE — Progress Notes (Signed)
@Patient  ID: Catherine Berg, female    DOB: [birth year], 42 y.o.   MRN: [id number]  Chief Complaint  Patient presents with   Follow-up    Referring provider: Steele Sizer, MD  HPI: 42 yo female never smoker followed for moderate asthma  Medical history significant for Covid 19 with long haul covid symptoms-dyspnea, fatigue, and brain fog.   TEST/EVENTS :   05/12/2022 Follow up ; Asthma  Patient presents for a follow up for asthma . Last seen May 2022. Complains that asthma has not been under good control . More shortness of breath , come intermittent cough and wheezing. Feels like she can not get in good breath . Lost insurance , just got back. Has been out of [facility] for last 6 months. Seen in ER on 05/08/2022 , chest xray clear, D dimer and troponin okay . Today FENO testing normal at 18 ppb. Unable to spirometry today in office, set up for formal PFT .  Has been having some intermittent reflux.  He is now taking Prilosec daily.  Has intermittent nasal congestion.  No significant drainage.  Has been out of Singulair for the last 6 months as well. She remains unable to work since 2021 due to [place].  She has brain fog, shortness of breath and fatigue.   Allergies  Allergen Reactions   Latex Hives    (Balloons, condoms, underwear elastic, gloves)   Shellfish Allergy Hives    Immunization History  Administered Date(s) Administered   Hepatitis A 01/31/2007, 08/10/2007   Hepatitis B 01/31/2007, 08/10/2007, 07/02/2008   Influenza,inj,Quad PF,6+ Mos 03/15/2018, 01/05/2019, 03/06/2020   Td 10/29/1995, 08/03/2017   Tdap 08/10/2007    Past Medical History:  Diagnosis Date   Abnormal thyroid blood test    Anemia    Anxiety    Asthma    Complication of anesthesia    itching after c sections   COVID-19 01/2020   Depression    Elevated serum glutamic pyruvic transaminase (SGPT) level    Graves disease    History of abnormal cervical Pap smear    History of cervical  polypectomy    Hives 09/02/2015   Hypertension    Hypothyroidism    IFG (impaired fasting glucose)    Incidental lung nodule, > 85mm and < 40mm 03/31/2017   4 mm RLL lung nodule on chest CT Mar 31, 2017   Low serum vitamin D    Obesity    PONV (postoperative nausea and vomiting)    after c section had vomit   Pregnancy induced hypertension    with 1st pregnancy, normal pressure with 2nd   Reflux    Thyroid disease    Vitamin B12 deficiency    Vitamin D deficiency disease    Wears dentures    full upper    Tobacco History: Social History   Tobacco Use  Smoking Status Never  Smokeless Tobacco Never   Counseling given: Not Answered   Outpatient Medications Prior to Visit  Medication Sig Dispense Refill   buPROPion (WELLBUTRIN XL) 150 MG 24 hr tablet 150 mg daily for one day, then 300 mg daily for one day, then 450 mg daily 90 tablet 1   clobetasol (TEMOVATE) 0.05 % external solution Apply 1 application topically 2 (two) times daily. 50 mL 0   Cyanocobalamin (B-12) 1000 MCG SUBL Place 1 tablet under the tongue daily. 90 tablet 1   DULoxetine (CYMBALTA) 30 MG capsule 30 mg daily for one day, then 60  mg daily for one day, then 90 mg daily 90 capsule 1   etonogestrel (NEXPLANON) 68 MG IMPL implant Inject 68 mg into the skin once.     gabapentin (NEURONTIN) 300 MG capsule Take 1 capsule (300 mg total) by mouth 3 (three) times daily. 90 capsule 3   levothyroxine (SYNTHROID) 75 MCG tablet Take 75 mcg by mouth daily before breakfast.     topiramate (TOPAMAX) 25 MG tablet Take 1 tablet (25 mg total) by mouth at bedtime. 90 tablet 3   Vitamin D, Ergocalciferol, (DRISDOL) 1.25 MG (50000 UNIT) CAPS capsule Take 1 capsule (50,000 Units total) by mouth every 7 (seven) days. 12 capsule 1   albuterol (VENTOLIN HFA) 108 (90 Base) MCG/ACT inhaler Inhale 2 puffs into the lungs every 6 (six) hours as needed for wheezing or shortness of breath. 18 g 1   mometasone-formoterol (DULERA) 200-5 MCG/ACT  AERO Inhale 2 puffs into the lungs in the morning and at bedtime. 1 each 11   montelukast (SINGULAIR) 10 MG tablet Take 1 tablet (10 mg total) by mouth at bedtime. 90 tablet 1   Azelastine HCl 137 MCG/SPRAY SOLN SPRAY TWO SPRAYS IN EACH NOSTRIL TWICE DAILY. (Patient not taking: Reported on 05/12/2022) 30 mL 1   No facility-administered medications prior to visit.     Review of Systems:   Constitutional:   No  weight loss, night sweats,  Fevers, chills, + fatigue, or  lassitude.  HEENT:   No headaches,  Difficulty swallowing,  Tooth/dental problems, or  Sore throat,                No sneezing, itching, ear ache, nasal congestion, post nasal drip,   CV:  No chest pain,  Orthopnea, PND, swelling in lower extremities, anasarca, dizziness, palpitations, syncope.   GI  No heartburn, indigestion, abdominal pain, nausea, vomiting, diarrhea, change in bowel habits, loss of appetite, bloody stools.   Resp:  No chest wall deformity  Skin: no rash or lesions.  GU: no dysuria, change in color of urine, no urgency or frequency.  No flank pain, no hematuria   MS:  No joint pain or swelling.  No decreased range of motion.  No back pain.    Physical Exam  BP (!) 148/90 (BP Location: Right Arm, Cuff Size: Large)   Pulse 71   Temp (!) 97 F (36.1 C)   Ht 5\' 3"  (1.6 m)   Wt 236 lb (107 kg)   SpO2 99%   BMI 41.81 kg/m   GEN: A/Ox3; pleasant , NAD, well nourished    HEENT:  [place]/AT,   NOSE-clear, THROAT-clear, no lesions, no postnasal drip or exudate noted.   NECK:  Supple w/ fair ROM; no JVD; normal carotid impulses w/o bruits; no thyromegaly or nodules palpated; no lymphadenopathy.    RESP  Clear  P & A; w/o, wheezes/ rales/ or rhonchi. no accessory muscle use, no dullness to percussion  CARD:  RRR, no m/r/g, no peripheral edema, pulses intact, no cyanosis or clubbing.  GI:   Soft & nt; nml bowel sounds; no organomegaly or masses detected.   Musco: Warm bil, no deformities or joint  swelling noted.   Neuro: alert, no focal deficits noted.    Skin: Warm, no lesions or rashes    Lab Results:  CBC    Component Value Date/Time   WBC 9.2 05/07/2022 2308   RBC 4.28 05/07/2022 2308   HGB 12.3 05/07/2022 2308   HGB 12.6 07/17/2020 1025  HCT 37.6 05/07/2022 2308   HCT 38.3 07/17/2020 1025   PLT 320 05/07/2022 2308   PLT 343 07/17/2020 1025   MCV 87.9 05/07/2022 2308   MCV 85 07/17/2020 1025   MCV 86 08/13/2012 2254   MCH 28.7 05/07/2022 2308   MCHC 32.7 05/07/2022 2308   RDW 13.2 05/07/2022 2308   RDW 12.9 07/17/2020 1025   RDW 13.6 08/13/2012 2254   LYMPHSABS 4.3 (H) 09/01/2020 0529   LYMPHSABS 3.1 07/21/2016 1624   MONOABS 0.7 09/01/2020 0529   EOSABS 0.2 09/01/2020 0529   EOSABS 0.1 07/21/2016 1624   BASOSABS 0.0 09/01/2020 0529   BASOSABS 0.0 07/21/2016 1624    BMET    Component Value Date/Time   NA 134 (L) 05/07/2022 2308   NA 137 07/17/2020 1025   NA 134 (L) 08/13/2012 2254   K 3.6 05/07/2022 2308   K 3.9 08/13/2012 2254   CL 104 05/07/2022 2308   CL 105 08/13/2012 2254   CO2 24 05/07/2022 2308   CO2 23 08/13/2012 2254   GLUCOSE 115 (H) 05/07/2022 2308   GLUCOSE 101 (H) 08/13/2012 2254   BUN 16 05/07/2022 2308   BUN 14 07/17/2020 1025   BUN 10 08/13/2012 2254   CREATININE 0.90 05/07/2022 2308   CREATININE 0.78 11/17/2021 1413   CALCIUM 8.9 05/07/2022 2308   CALCIUM 9.0 08/13/2012 2254   GFRNONAA >60 05/07/2022 2308   GFRNONAA 100 07/17/2020 0941   GFRAA 116 07/17/2020 0941    BNP    Component Value Date/Time   BNP 17.0 03/31/2017 1321    ProBNP No results found for: "PROBNP"  Imaging: DG Chest Port 1 View  Result Date: 05/07/2022 CLINICAL DATA:  10026 Shortness of breath 10026 EXAM: PORTABLE CHEST 1 VIEW COMPARISON:  Chest x-ray 02/14/2022 FINDINGS: The heart and mediastinal contours are unchanged. No focal consolidation. No pulmonary edema. No pleural effusion. No pneumothorax. No acute osseous abnormality. IMPRESSION: No  active disease. Electronically Signed   By: Iven Finn M.D.   On: 05/07/2022 23:37          No data to display          Lab Results  Component Value Date   NITRICOXIDE 18 05/12/2022        Assessment & Plan:   Asthma Mild to moderate persistent asthma currently not optimally controlled since she is been off of her maintenance regimen for the last 6 months.  Recent workup in the emergency room showed clear x-ray, normal D-dimer and troponin.  Exhaled nitric oxide testing today in the office is normal.  Will need PFTs.  Will restart Dulera and Singulair to see if this helps to control daily symptoms.  Albuterol as needed.  Asthma action plan discussed in detail.  Control for triggers.  Plan  Patient Instructions  Restart Dulera 2 puffs Twice daily, rinse after use.  Albuterol inhaler As needed   Asthma action plan as discussed  Restart Singulair 10mg  daily  Continue on Prilosec 20mg  daily .  Follow up with Dr. Mortimer Fries in 6-8 weeks with PFT and As needed      Dyspnea on exertion Chronic shortness of breath since COVID-19.  Check PFTs on return.  Recent chest x-ray clear     Rexene Edison, NP 05/12/2022

## 2022-05-12 NOTE — Assessment & Plan Note (Signed)
Chronic shortness of breath since COVID-19.  Check PFTs on return.  Recent chest x-ray clear

## 2022-05-12 NOTE — Assessment & Plan Note (Signed)
Mild to moderate persistent asthma currently not optimally controlled since she is been off of her maintenance regimen for the last 6 months.  Recent workup in the emergency room showed clear x-ray, normal D-dimer and troponin.  Exhaled nitric oxide testing today in the office is normal.  Will need PFTs.  Will restart Dulera and Singulair to see if this helps to control daily symptoms.  Albuterol as needed.  Asthma action plan discussed in detail.  Control for triggers.  Plan  Patient Instructions  Restart Dulera 2 puffs Twice daily, rinse after use.  Albuterol inhaler As needed   Asthma action plan as discussed  Restart Singulair 10mg  daily  Continue on Prilosec 20mg  daily .  Follow up with Dr. Mortimer Fries in 6-8 weeks with PFT and As needed

## 2022-05-19 NOTE — Progress Notes (Deleted)
Name: Catherine Berg   MRN: [id number]    DOB: Aug 23, 1980   Date:05/19/2022       Progress Note  Subjective  Chief Complaint  Follow Up  HPI  COVID-19 long haul : [name] had [place] 01/2020 and is still struggling with post-viral fatigue, cognitive decline, lower extremity pain/neuropathy, joint aches, post-exercise fatigue, SOB with activity, expressive aphasia, change of sense of smell is still not back to normal  . She depression prior to [place] she is now having PTSD and worsening of her depression, she is seeing psychiatrist , currently not seeing a therapist due to cost, but waiting to go to [facility]  She was seen by  multiple doctors: Pulmonologist, Rheumatologist, Psychiatrist, Neurologist, PT, OT now also PMR doctors, however no longer has insurance but still seeing PMR -Dr. Ranell Patrick and also psychiatrist Dr. Modesta Messing.   Asthma Mild intermittent: she takes singulair but currently cannot afford Dulera due to lack of insurance. She has not been back to see Dr. Mortimer Fries, she has intermittent SOB. No cough or wheezing a this time  Perennial AR: she states symptoms are stable, she has nasal congestion at night , she has been using her nasal spray   Vitamin D deficiency: she is taking weekly vitamin D   B12 deficiency: reminded her to get SL B12 since last level was low and she has fatigue plus neuropathy   Morbid Obesity: weight has been stable, BMI is above 40, she does not have insurance at this time and cannot afford medication.   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   Memory loss 02/28/2020   Physical deconditioning 02/28/2020   Dyspnea on exertion 02/28/2020   Neck pain, bilateral 02/28/2020   Muscle spasms of neck 02/28/2020   Arthralgia of hand 01/24/2019   CRP elevated 01/24/2019   ESR raised 01/24/2019   Class 3 severe obesity due to excess calories without serious comorbidity in adult [facility]) 01/24/2019   Palpitations 09/16/2018   Coronary artery calcification 09/16/2018   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   Bilateral leg edema 04/20/2017   Incidental lung nodule, > 98m and < 888m11/28/2018   Hives 09/02/2015   Morbid [place] ([place]/13/2017   Anxiety 07/09/2015   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 06/28/2015   Thyroid nodule 02/01/2015   Vitamin D deficiency disease    Vitamin B12 deficiency    History of abnormal cervical Pap smear    Allergy-induced asthma, mild intermittent, uncomplicated [id number]  Past Surgical History:  Procedure Laterality Date   CERVICAL POLYPECTOMY     CESAREAN SECTION     x 2   COLONOSCOPY WITH PROPOFOL N/A 11/14/2018   Procedure: COLONOSCOPY WITH PROPOFOL;  Surgeon: TaVirgel ManifoldMD;  [place] [facility] [place] [facility] [place];  Laterality: N/A;   ESOPHAGOGASTRODUODENOSCOPY (EGD) WITH PROPOFOL N/A 11/14/2018   Procedure: ESOPHAGOGASTRODUODENOSCOPY (EGD) WITH PROPOFOL;  Surgeon: TaVirgel ManifoldMD;  [place] [place] [facility];  Laterality: N/A;  Latex allergy   GIVENS CAPSULE STUDY N/A 12/27/2018   Procedure: GIVENS CAPSULE STUDY;  Surgeon: Virgel Manifold, MD;  Location: [facility] ENDOSCOPY;  Service: Endoscopy;  Laterality: N/A;    Family History  Problem Relation Age of Onset   Heart attack Mother 76   Hypertension Mother    Asthma Mother    Cancer Mother        laryngeal   Diabetes Mother        pre  diabetic   Heart disease Mother    Mental illness Mother    Alcohol abuse Mother    Drug abuse Mother    Depression Mother    Anxiety disorder Mother    Bipolar disorder Mother    Learning disabilities Brother    ADD / ADHD Brother    Asthma Son    Hyperlipidemia Brother    Alcohol abuse Father    Heart disease Maternal Grandmother        heart attack, pacemaker   Heart attack Maternal Grandmother    Hypertension Maternal Grandmother    Hypertension Maternal Grandfather    Heart disease Paternal Grandmother        couple of major open heart surgeries, leaking valves   Hypertension Paternal Grandmother    Hypertension Paternal Grandfather    Breast cancer Neg Hx     Social History   Tobacco Use   Smoking status: Never   Smokeless tobacco: Never  Substance Use Topics   Alcohol use: No    Alcohol/week: 0.0 standard drinks of alcohol     Current Outpatient Medications:    albuterol (VENTOLIN HFA) 108 (90 Base) MCG/ACT inhaler, Inhale 2 puffs into the lungs every 6 (six) hours as needed for wheezing or shortness of breath., Disp: 18 g, Rfl: 1   Azelastine HCl 137 MCG/SPRAY SOLN, SPRAY TWO SPRAYS IN EACH NOSTRIL TWICE DAILY. (Patient not taking: Reported on 05/12/2022), Disp: 30 mL, Rfl: 1   buPROPion (WELLBUTRIN XL) 150 MG 24 hr tablet, 150 mg daily for one day, then 300 mg daily for one day, then 450 mg daily, Disp: 90 tablet, Rfl: 1   clobetasol (TEMOVATE) 0.05 % external solution, Apply 1 application topically 2 (two) times daily., Disp: 50 mL, Rfl: 0   Cyanocobalamin (B-12) 1000 MCG SUBL, Place 1 tablet under the tongue daily., Disp: 90 tablet, Rfl: 1   DULoxetine (CYMBALTA) 30 MG capsule, 30 mg daily for one day, then 60 mg daily for one day, then 90 mg daily, Disp: 90 capsule, Rfl: 1   etonogestrel (NEXPLANON) 68 MG IMPL implant, Inject 68 mg into the skin once., Disp: , Rfl:    gabapentin (NEURONTIN) 300 MG capsule, Take 1 capsule (300 mg total) by mouth 3 (three) times  daily., Disp: 90 capsule, Rfl: 3   levothyroxine (SYNTHROID) 75 MCG tablet, Take 75 mcg by mouth daily before breakfast., Disp: , Rfl:    mometasone-formoterol (DULERA) 200-5 MCG/ACT AERO, Inhale 2 puffs into the lungs in the morning and at bedtime., Disp: 1 each, Rfl: 11   montelukast (SINGULAIR) 10 MG tablet, Take 1 tablet (10 mg total) by mouth at bedtime., Disp: 90 tablet, Rfl: 1   topiramate (TOPAMAX) 25 MG tablet, Take 1 tablet (25 mg total) by mouth at bedtime., Disp: 90 tablet, Rfl: 3   Vitamin D, Ergocalciferol, (DRISDOL) 1.25 MG (50000 UNIT) CAPS capsule, Take 1 capsule (50,000 Units total) by mouth every 7 (seven) days., Disp: 12 capsule, Rfl: 1  Allergies  Allergen Reactions  Latex Hives    (Balloons, condoms, underwear elastic, gloves)   Shellfish Allergy Hives    I personally reviewed active problem list, medication list, allergies, family history, social history, health maintenance with the patient/caregiver today.   ROS  ***  Objective  There were no vitals filed for this visit.  There is no height or weight on file to calculate BMI.  Physical Exam ***   PHQ2/9:    02/03/2022    1:18 PM 11/17/2021    1:08 PM 09/09/2021   11:32 AM 06/17/2021    3:00 PM 06/10/2021    4:10 PM  Depression screen PHQ 2/9  Decreased Interest 3 1  1   $ Down, Depressed, Hopeless 3 2  1   $ PHQ - 2 Score 6 3  2   $ Altered sleeping  0     Tired, decreased energy  2     Change in appetite  0     Feeling bad or failure about yourself   1     Trouble concentrating  0     Moving slowly or fidgety/restless  0     Suicidal thoughts  0     PHQ-9 Score  6     Difficult doing work/chores          Information is confidential and restricted. Go to Review Flowsheets to unlock data.    phq 9 is {gen pos [id number]   Fall Risk:    02/03/2022    1:18 PM 11/17/2021    1:08 PM 06/17/2021    3:00 PM 05/19/2021    1:36 PM 01/23/2021   11:54 AM  Fall Risk   Falls in the past year? 1 1 0 0 0   Number falls in past yr: 0 0 0 0   Injury with Fall? 0 0 0 0   Risk for fall due to :  No Fall Risks  No Fall Risks   Follow up  Falls prevention discussed  Falls prevention discussed       Functional Status Survey:      Assessment & Plan  *** There are no diagnoses linked to this encounter.

## 2022-05-20 ENCOUNTER — Ambulatory Visit: Payer: Medicaid Other | Admitting: Family Medicine

## 2022-05-21 ENCOUNTER — Telehealth: Payer: Medicaid Other | Admitting: Family

## 2022-05-21 DIAGNOSIS — I10 Essential (primary) hypertension: Secondary | ICD-10-CM | POA: Diagnosis not present

## 2022-05-21 MED ORDER — AMLODIPINE BESYLATE 5 MG PO TABS: 5.0000 mg | ORAL_TABLET | Freq: Every day | ORAL | 0 refills | Status: DC

## 2022-05-21 NOTE — Patient Instructions (Signed)
Hypertension, Adult High blood pressure (hypertension) is when the force of blood pumping through the arteries is too strong. The arteries are the blood vessels that carry blood from the heart throughout the body. Hypertension forces the heart to work harder to pump blood and may cause arteries to become narrow or stiff. Untreated or uncontrolled hypertension can lead to a heart attack, heart failure, a stroke, kidney disease, and other problems. A blood pressure reading consists of a higher number over a lower number. Ideally, your blood pressure should be below 120/80. The first ("top") number is called the systolic pressure. It is a measure of the pressure in your arteries as your heart beats. The second ("bottom") number is called the diastolic pressure. It is a measure of the pressure in your arteries as the heart relaxes. What are the causes? The exact cause of this condition is not known. There are some conditions that result in high blood pressure. What increases the risk? Certain factors may make you more likely to develop high blood pressure. Some of these risk factors are under your control, including: Smoking. Not getting enough exercise or physical activity. Being overweight. Having too much fat, sugar, calories, or salt (sodium) in your diet. Drinking too much alcohol. Other risk factors include: Having a personal history of heart disease, diabetes, high cholesterol, or kidney disease. Stress. Having a family history of high blood pressure and high cholesterol. Having obstructive sleep apnea. Age. The risk increases with age. What are the signs or symptoms? High blood pressure may not cause symptoms. Very high blood pressure (hypertensive crisis) may cause: Headache. Fast or irregular heartbeats (palpitations). Shortness of breath. Nosebleed. Nausea and vomiting. Vision changes. Severe chest pain, dizziness, and seizures. How is this diagnosed? This condition is diagnosed by  measuring your blood pressure while you are seated, with your arm resting on a flat surface, your legs uncrossed, and your feet flat on the floor. The cuff of the blood pressure monitor will be placed directly against the skin of your upper arm at the level of your heart. Blood pressure should be measured at least twice using the same arm. Certain conditions can cause a difference in blood pressure between your right and left arms. If you have a high blood pressure reading during one visit or you have normal blood pressure with other risk factors, you may be asked to: Return on a different day to have your blood pressure checked again. Monitor your blood pressure at home for 1 week or longer. If you are diagnosed with hypertension, you may have other blood or imaging tests to help your health care provider understand your overall risk for other conditions. How is this treated? This condition is treated by making healthy lifestyle changes, such as eating healthy foods, exercising more, and reducing your alcohol intake. You may be referred for counseling on a healthy diet and physical activity. Your health care provider may prescribe medicine if lifestyle changes are not enough to get your blood pressure under control and if: Your systolic blood pressure is above 130. Your diastolic blood pressure is above 80. Your personal target blood pressure may vary depending on your medical conditions, your age, and other factors. Follow these instructions at home: Eating and drinking  Eat a diet that is high in fiber and potassium, and low in sodium, added sugar, and fat. An example of this eating plan is called the DASH diet. DASH stands for Dietary Approaches to Stop Hypertension. To eat this way: Eat   plenty of fresh fruits and vegetables. Try to fill one half of your plate at each meal with fruits and vegetables. Eat whole grains, such as whole-wheat pasta, brown rice, or whole-grain bread. Fill about one  fourth of your plate with whole grains. Eat or drink low-fat dairy products, such as skim milk or low-fat yogurt. Avoid fatty cuts of meat, processed or cured meats, and poultry with skin. Fill about one fourth of your plate with lean proteins, such as fish, chicken without skin, beans, eggs, or tofu. Avoid pre-made and processed foods. These tend to be higher in sodium, added sugar, and fat. Reduce your daily sodium intake. Many people with hypertension should eat less than 1,500 mg of sodium a day. Do not drink alcohol if: Your health care provider tells you not to drink. You are pregnant, may be pregnant, or are planning to become pregnant. If you drink alcohol: Limit how much you have to: 0-1 drink a day for women. 0-2 drinks a day for men. Know how much alcohol is in your drink. In the [place] one drink equals one 12 oz bottle of beer (355 mL), one 5 oz glass of wine (148 mL), or one 1 oz glass of hard liquor (44 mL). Lifestyle  Work with your health care provider to maintain a healthy body weight or to lose weight. Ask what an ideal weight is for you. Get at least 30 minutes of exercise that causes your heart to beat faster (aerobic exercise) most days of the week. Activities may include walking, swimming, or biking. Include exercise to strengthen your muscles (resistance exercise), such as Pilates or lifting weights, as part of your weekly exercise routine. Try to do these types of exercises for 30 minutes at least 3 days a week. Do not use any products that contain nicotine or tobacco. These products include cigarettes, chewing tobacco, and vaping devices, such as e-cigarettes. If you need help quitting, ask your health care provider. Monitor your blood pressure at home as told by your health care provider. Keep all follow-up visits. This is important. Medicines Take over-the-counter and prescription medicines only as told by your health care provider. Follow directions carefully. Blood  pressure medicines must be taken as prescribed. Do not skip doses of blood pressure medicine. Doing this puts you at risk for problems and can make the medicine less effective. Ask your health care provider about side effects or reactions to medicines that you should watch for. Contact a health care provider if you: Think you are having a reaction to a medicine you are taking. Have headaches that keep coming back (recurring). Feel dizzy. Have swelling in your ankles. Have trouble with your vision. Get help right away if you: Develop a severe headache or confusion. Have unusual weakness or numbness. Feel faint. Have severe pain in your chest or abdomen. Vomit repeatedly. Have trouble breathing. These symptoms may be an emergency. Get help right away. Call 911. Do not wait to see if the symptoms will go away. Do not drive yourself to the hospital. Summary Hypertension is when the force of blood pumping through your arteries is too strong. If this condition is not controlled, it may put you at risk for serious complications. Your personal target blood pressure may vary depending on your medical conditions, your age, and other factors. For most people, a normal blood pressure is less than 120/80. Hypertension is treated with lifestyle changes, medicines, or a combination of both. Lifestyle changes include losing weight, eating a healthy,   low-sodium diet, exercising more, and limiting alcohol. This information is not intended to replace advice given to you by your health care provider. Make sure you discuss any questions you have with your health care provider. Document Revised: 02/25/2021 Document Reviewed: 02/25/2021 Elsevier Patient Education  2023 [employer]  

## 2022-05-21 NOTE — Progress Notes (Signed)
Virtual Visit Consent   Catherine Berg, you are scheduled for a virtual visit with a [facility] provider today. Just as with appointments in the office, your consent must be obtained to participate. Your consent will be active for this visit and any virtual visit you may have with one of our providers in the next 365 days. If you have a MyChart account, a copy of this consent can be sent to you electronically.  As this is a virtual visit, video technology does not allow for your provider to perform a traditional examination. This may limit your provider's ability to fully assess your condition. If your provider identifies any concerns that need to be evaluated in person or the need to arrange testing (such as labs, EKG, etc.), we will make arrangements to do so. Although advances in technology are sophisticated, we cannot ensure that it will always work on either your end or our end. If the connection with a video visit is poor, the visit may have to be switched to a telephone visit. With either a video or telephone visit, we are not always able to ensure that we have a secure connection.  By engaging in this virtual visit, you consent to the provision of healthcare and authorize for your insurance to be billed (if applicable) for the services provided during this visit. Depending on your insurance coverage, you may receive a charge related to this service.  I need to obtain your verbal consent now. Are you willing to proceed with your visit today? Catherine Berg has provided verbal consent on 05/21/2022 for a virtual visit (video or telephone). Catherine Dun, FNP  Date: 05/21/2022 7:37 PM  Virtual Visit via Video Note   I, Catherine Berg, connected with  Catherine Berg  ([id number], 1980-08-24) on 05/21/22 at  7:30 PM EST by a video-enabled telemedicine application and verified that I am speaking with the correct person using two identifiers.  Location: Patient: Virtual Visit Location Patient:  Other: car Provider: Virtual Visit Location Provider: Home Office   I discussed the limitations of evaluation and management by telemedicine and the availability of in person appointments. The patient expressed understanding and agreed to proceed.    History of Present Illness: Catherine Berg is a 42 y.o. who identifies as a female who was assigned female at birth, and is being seen today for elevated blood pressure. Reports her BP in the ED 05/08/22 was 146/88  and 140/81. Then reports her BP today at home was 173/99.  HPI: Hypertension This is a new problem. The current episode started more than 1 month ago. The problem has been waxing and waning since onset. The problem is uncontrolled. Associated symptoms include headaches and malaise/fatigue. Pertinent negatives include no peripheral edema or shortness of breath. Past treatments include nothing. The current treatment provides no improvement. There is no history of kidney disease, CVA or heart failure.    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   COVID-19 long hauler 07/02/2020   Vitamin D deficiency 04/22/2020   COVID-19 long hauler manifesting chronic fatigue 04/22/2020   History of COVID-19 02/28/2020   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   Memory loss 02/28/2020   Physical deconditioning 02/28/2020   Dyspnea on exertion 02/28/2020   Neck pain, bilateral 02/28/2020   Muscle spasms of neck 02/28/2020   Arthralgia of hand 01/24/2019   CRP elevated 01/24/2019   ESR raised 01/24/2019   Class 3 severe obesity due to excess calories without serious  comorbidity in adult [facility]) 01/24/2019   Palpitations 09/16/2018   Coronary artery calcification 09/16/2018   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   Bilateral leg edema 04/20/2017   Incidental lung nodule, > 55mm and < 62mm 03/31/2017   Hives 09/02/2015   Morbid obesity ([place]) 08/15/2015   Anxiety 07/09/2015   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 06/28/2015   Thyroid nodule 02/01/2015   Vitamin D deficiency disease    Vitamin B12 deficiency    History of abnormal cervical Pap smear    Allergy-induced asthma, mild intermittent, uncomplicated 95/28/4132    Allergies:  Allergies  Allergen Reactions   Latex Hives    (Balloons, condoms, underwear elastic, gloves)   Shellfish Allergy Hives   Medications:  Current Outpatient Medications:    amLODipine (NORVASC) 5 MG tablet, Take 1 tablet (5 mg total) by mouth daily., Disp: 90 tablet, Rfl: 0   albuterol (VENTOLIN HFA) 108 (90 Base) MCG/ACT inhaler, Inhale 2 puffs into the lungs every 6 (six) hours as needed for wheezing or shortness of breath., Disp: 18 g, Rfl: 1   Azelastine HCl 137 MCG/SPRAY SOLN, SPRAY TWO SPRAYS IN EACH NOSTRIL TWICE DAILY. (Patient not taking: Reported on 05/12/2022), Disp: 30 mL, Rfl: 1   buPROPion (WELLBUTRIN XL) 150 MG 24 hr tablet, 150 mg daily for one day, then 300 mg daily for one day, then 450 mg daily, Disp: 90 tablet, Rfl: 1   clobetasol (TEMOVATE) 0.05 % external solution, Apply 1 application topically 2 (two) times daily., Disp: 50 mL, Rfl: 0   Cyanocobalamin (B-12) 1000 MCG SUBL, Place 1 tablet under the tongue daily., Disp: 90 tablet, Rfl: 1   DULoxetine (CYMBALTA) 30 MG capsule, 30 mg daily for one day, then 60 mg daily for one day, then 90 mg daily, Disp: 90 capsule, Rfl: 1   etonogestrel (NEXPLANON) 68 MG IMPL implant, Inject 68 mg into the skin once., Disp: , Rfl:    gabapentin (NEURONTIN) 300 MG capsule, Take 1 capsule (300 mg total) by mouth 3 (three) times daily., Disp: 90 capsule, Rfl: 3   levothyroxine  (SYNTHROID) 75 MCG tablet, Take 75 mcg by mouth daily before breakfast., Disp: , Rfl:    mometasone-formoterol (DULERA) 200-5 MCG/ACT AERO, Inhale 2 puffs into the lungs in the morning and at bedtime., Disp: 1 each, Rfl: 11   montelukast (SINGULAIR) 10 MG tablet, Take 1 tablet (10 mg total) by mouth at bedtime., Disp: 90 tablet, Rfl: 1   topiramate (TOPAMAX) 25 MG tablet, Take 1 tablet (25 mg total) by mouth at bedtime., Disp: 90 tablet, Rfl: 3   Vitamin D, Ergocalciferol, (DRISDOL) 1.25 MG (50000 UNIT) CAPS capsule, Take 1 capsule (50,000 Units total) by mouth every 7 (seven) days., Disp: 12 capsule, Rfl: 1  Observations/Objective: Patient is well-developed, well-nourished in no acute distress.  Resting comfortably  Head is normocephalic, atraumatic.  No labored breathing.  Speech is clear and coherent with logical content.  Patient is alert and oriented at baseline.    Assessment and Plan: 1. Primary hypertension - amLODipine (NORVASC) 5 MG tablet; Take 1 tablet (5 mg total) by mouth daily.  Dispense: 90 tablet; Refill: 0  Start Norvsac 5  mg today -Dash diet information given -Exercise encouraged - Stress Management  -Continue current meds -Call PCP tomorrow and make follow up to see them in next 2 weeks   Follow Up Instructions: I discussed the assessment and treatment plan with the patient. The patient was provided an opportunity to ask questions and all were answered. The patient agreed with the plan and demonstrated an understanding of the instructions.  A copy of instructions were sent to the patient via MyChart unless otherwise noted below.     The patient was advised to call back or seek an in-person evaluation if the symptoms worsen or if the condition fails to improve as anticipated.  Time:  I spent 11 minutes with the patient via telehealth technology discussing the above problems/concerns.    Jannifer Rodney, FNP

## 2022-05-22 ENCOUNTER — Encounter: Payer: Self-pay | Admitting: Family Medicine

## 2022-05-22 NOTE — Progress Notes (Signed)
Name: Catherine Berg   MRN: [id number]    DOB: [birth year]   Date:05/25/2022       Progress Note  Subjective  Chief Complaint  Blood Pressure  HPI  HTN: she went to [facility] last week due to bp spiking to 170's  and had a headache, She states last night also elevated at [facility], she does not have a bp monitor at home, bp today is normal, but was a little elevated at pulmonologist office. 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 She had  depression prior to COVID but got worse after COVID and was having PTSD and worsening of her depression, she was seeing psychiatrist but had a gap insurance but would like to go back to see her.  She was seen by  multiple doctors: Pulmonologist, Rheumatologist, Psychiatrist, Neurologist, PT, OT now also PMR doctors, however no longer has insurance but still seeing PMR -Dr. Carlis Abbott and also psychiatrist Dr. Vanetta Shawl.   Post-ablation hypothyroidism: used to see Dr. Gershon Crane and needs to go back and get levels done   Asthma Mild intermittent: she was seen by pulmonologist last week for evaluation of increase in sob, she will go back next month for PFT and also follow up, at this time still using Dulera twice daily, montelukast at night , she has been using her rescue inhaler intermittently, last use was 3 days ago. She went to [facility] on 01/04 with increase of SOB and at the time rescue inhaler did not seem to work. At [facility] CXR was normal, reviewed labs done at [facility] including negative d-dimer, negative covid, flu and RSV tests, normal CBC and slightly low sodium otherwise unremarkable    Perennial AR: she states symptoms are well controlled, not using nasal spray, but  taking singulair and prn anti-histamines    Vitamin D deficiency: she is taking weekly vitamin D , we will recheck in a few months    B12 deficiency: reminded her to get SL B12 since last level was low and she has fatigue plus neuropathy , we will give her a B12 injection    Morbid Obesity: weight is trending up again,  BMI is above 40, discussed life style modification, she has insurance again but is through [place] and not sure if they cover weight loss cost    Patient Active Problem List   Diagnosis Date Noted   Asthma 05/12/2022   MDD (major depressive disorder), recurrent episode, mild (HCC) 11/17/2021   Neuropathy involving both lower extremities 11/17/2021   Perennial allergic rhinitis with seasonal variation 11/17/2021   Chronic pain of both lower extremities 09/24/2020   Sleeps in sitting position due to orthopnea 07/02/2020   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   COVID-19 long hauler 07/02/2020   Vitamin D deficiency 04/22/2020   COVID-19 long hauler manifesting chronic fatigue 04/22/2020   History of COVID-19 02/28/2020   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   Memory loss 02/28/2020   Physical deconditioning 02/28/2020   Dyspnea on exertion 02/28/2020   Neck pain, bilateral 02/28/2020   Muscle spasms of neck 02/28/2020   Arthralgia of hand 01/24/2019   CRP elevated 01/24/2019   ESR raised 01/24/2019   Class 3 severe obesity due to excess  calories without serious comorbidity in adult [facility]) 01/24/2019   Palpitations 09/16/2018   Coronary artery calcification 09/16/2018   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   Bilateral leg edema 04/20/2017   Incidental lung nodule, > 64mm and < 42mm 03/31/2017   Hives 09/02/2015   Morbid obesity ([place]) 08/15/2015   Anxiety 07/09/2015   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 06/28/2015   Thyroid nodule 02/01/2015   Vitamin D deficiency disease    Vitamin B12 deficiency    History of abnormal cervical Pap smear    Allergy-induced asthma, mild  intermittent, uncomplicated 20/35/5974    Past Surgical History:  Procedure Laterality Date   CERVICAL POLYPECTOMY     CESAREAN SECTION     x 2   COLONOSCOPY WITH PROPOFOL N/A 11/14/2018   Procedure: COLONOSCOPY WITH PROPOFOL;  Surgeon: Virgel Manifold, MD;  Location: [place];  Service: Endoscopy;  Laterality: N/A;   ESOPHAGOGASTRODUODENOSCOPY (EGD) WITH PROPOFOL N/A 11/14/2018   Procedure: ESOPHAGOGASTRODUODENOSCOPY (EGD) WITH PROPOFOL;  Surgeon: Virgel Manifold, MD;  Location: [place];  Service: Endoscopy;  Laterality: N/A;  Latex allergy   GIVENS CAPSULE STUDY N/A 12/27/2018   Procedure: GIVENS CAPSULE STUDY;  Surgeon: Virgel Manifold, MD;  Location: [facility];  Service: Endoscopy;  Laterality: N/A;    Family History  Problem Relation Age of Onset   Heart attack Mother 76   Hypertension Mother    Asthma Mother    Cancer Mother        laryngeal   Diabetes Mother        pre diabetic   Heart disease Mother    Mental illness Mother    Alcohol abuse Mother    Drug abuse Mother    Depression Mother    Anxiety disorder Mother    Bipolar disorder Mother    Learning disabilities Brother    ADD / ADHD Brother    Asthma Son    Hyperlipidemia Brother    Alcohol abuse Father    Heart disease Maternal Grandmother        heart attack, pacemaker   Heart attack Maternal Grandmother    Hypertension Maternal Grandmother    Hypertension Maternal Grandfather    Heart disease Paternal Grandmother        couple of major open heart surgeries, leaking valves   Hypertension Paternal Grandmother    Hypertension Paternal Grandfather    Breast cancer Neg Hx     Social History   Tobacco Use   Smoking status: Never   Smokeless tobacco: Never  Substance Use Topics   Alcohol use: No    Alcohol/week: 0.0 standard drinks of alcohol     Current Outpatient Medications:    albuterol (VENTOLIN HFA) 108 (90 Base) MCG/ACT inhaler, Inhale 2 puffs into  the lungs every 6 (six) hours as needed for wheezing or shortness of breath., Disp: 18 g, Rfl: 1   amLODipine (NORVASC) 5 MG tablet, Take 1 tablet (5 mg total) by mouth daily., Disp: 90 tablet, Rfl: 0   Azelastine HCl 137 MCG/SPRAY SOLN, SPRAY TWO SPRAYS IN EACH NOSTRIL TWICE DAILY., Disp: 30 mL, Rfl: 1   buPROPion (WELLBUTRIN XL) 150 MG 24 hr tablet, 150 mg daily for one day, then 300 mg daily for one day, then 450 mg daily, Disp: 90 tablet, Rfl: 1   Cyanocobalamin (B-12) 1000 MCG SUBL, Place 1 tablet under the tongue daily., Disp: 90 tablet, Rfl: 1   DULoxetine (CYMBALTA) 30 MG capsule, 30 mg daily for one day, then 60  mg daily for one day, then 90 mg daily, Disp: 90 capsule, Rfl: 1   etonogestrel (NEXPLANON) 68 MG IMPL implant, Inject 68 mg into the skin once., Disp: , Rfl:    levothyroxine (SYNTHROID) 75 MCG tablet, Take 75 mcg by mouth daily before breakfast., Disp: , Rfl:    mometasone-formoterol (DULERA) 200-5 MCG/ACT AERO, Inhale 2 puffs into the lungs in the morning and at bedtime., Disp: 1 each, Rfl: 11   montelukast (SINGULAIR) 10 MG tablet, Take 1 tablet (10 mg total) by mouth at bedtime., Disp: 90 tablet, Rfl: 1   topiramate (TOPAMAX) 25 MG tablet, Take 1 tablet (25 mg total) by mouth at bedtime., Disp: 90 tablet, Rfl: 3   Vitamin D, Ergocalciferol, (DRISDOL) 1.25 MG (50000 UNIT) CAPS capsule, Take 1 capsule (50,000 Units total) by mouth every 7 (seven) days., Disp: 12 capsule, Rfl: 1   clobetasol (TEMOVATE) 0.05 % external solution, Apply 1 application topically 2 (two) times daily. (Patient not taking: Reported on 05/25/2022), Disp: 50 mL, Rfl: 0  Allergies  Allergen Reactions   Latex Hives    (Balloons, condoms, underwear elastic, gloves)   Shellfish Allergy Hives    I personally reviewed active problem list, medication list, allergies, family history, social history, health maintenance with the patient/caregiver today.   ROS  Constitutional: Negative for fever, positive for   weight change.  Respiratory: Negative for cough , positive for shortness of breath.   Cardiovascular: Negative for chest pain or palpitations.  Gastrointestinal: Negative for abdominal pain, no bowel changes.  Musculoskeletal: Negative for gait problem or joint swelling.  Skin: Negative for rash.  Neurological: Negative for dizziness, positive for intermittent  headache.  No other specific complaints in a complete review of systems (except as listed in HPI above).   Objective  Vitals:   05/25/22 0910  BP: 128/82  Pulse: 90  Resp: 16  Temp: 98.3 F (36.8 C)  TempSrc: Oral  SpO2: 100%  Weight: 233 lb 9.6 oz (106 kg)  Height: 5\' 3"  (1.6 m)    Body mass index is 41.38 kg/m.  Physical Exam  Constitutional: Patient appears well-developed and well-nourished. Obese  No distress.  HEENT: head atraumatic, normocephalic, pupils equal and reactive to light, ears normal TM, neck supple, throat within normal limits Cardiovascular: Normal rate, regular rhythm and normal heart sounds.  No murmur heard. No BLE edema. Pulmonary/Chest: Effort normal and breath sounds normal. No respiratory distress. Abdominal: Soft.  There is no tenderness. Psychiatric: Patient has a normal mood and affect. behavior is normal. Judgment and thought content normal.    PHQ2/9:    05/25/2022    9:13 AM 02/03/2022    1:18 PM 11/17/2021    1:08 PM 09/09/2021   11:32 AM 06/17/2021    3:00 PM  Depression screen PHQ 2/9  Decreased Interest 2 3 1  1   Down, Depressed, Hopeless 1 3 2  1   PHQ - 2 Score 3 6 3  2   Altered sleeping 2  0    Tired, decreased energy 2  2    Change in appetite 0  0    Feeling bad or failure about yourself  0  1    Trouble concentrating 0  0    Moving slowly or fidgety/restless 0  0    Suicidal thoughts 0  0    PHQ-9 Score 7  6    Difficult doing work/chores Somewhat difficult         Information is confidential and restricted.  Go to Review Flowsheets to unlock data.    phq 9 is  positive   Fall Risk:    05/25/2022    9:12 AM 02/03/2022    1:18 PM 11/17/2021    1:08 PM 06/17/2021    3:00 PM 05/19/2021    1:36 PM  Fall Risk   Falls in the past year? 1 1 1  0 0  Number falls in past yr: 0 0 0 0 0  Injury with Fall? 0 0 0 0 0  Risk for fall due to : History of fall(s)  No Fall Risks  No Fall Risks  Follow up Falls prevention discussed;Education provided;Falls evaluation completed  Falls prevention discussed  Falls prevention discussed      Functional Status Survey: Is the patient deaf or have difficulty hearing?: No Does the patient have difficulty seeing, even when wearing glasses/contacts?: No Does the patient have difficulty concentrating, remembering, or making decisions?: Yes Does the patient have difficulty walking or climbing stairs?: No Does the patient have difficulty dressing or bathing?: No Does the patient have difficulty doing errands alone such as visiting a doctor's office or shopping?: No    Assessment & Plan  1. Postablative hypothyroidism  She will see Endocrinologist   2. Morbid obesity ([place])  Discussed with the patient the risk posed by an increased BMI. Discussed importance of portion control, calorie counting and at least 150 minutes of physical activity weekly. Avoid sweet beverages and drink more water. Eat at least 6 servings of fruit and vegetables daily    3. COVID-19 long hauler   4. Moderate recurrent major depression ([place])  - Ambulatory referral to Psychiatry  5. Vitamin B12 deficiency  B12 today   6. Vitamin D deficiency disease  Continue vitamin D   7. Neuropathy involving both lower extremities   8. Mild intermittent asthma without complication  Keep follow up with pulmonologist   9. Perennial allergic rhinitis with seasonal variation  Doing well   10. Breast cancer screening by mammogram  - MM 3D SCREEN BREAST BILATERAL; Future

## 2022-05-25 ENCOUNTER — Encounter: Payer: Self-pay | Admitting: Family Medicine

## 2022-05-25 ENCOUNTER — Ambulatory Visit: Payer: Self-pay | Admitting: Family Medicine

## 2022-05-25 VITALS — BP 128/82 | HR 90 | Temp 98.3°F | Resp 16 | Ht 63.0 in | Wt 233.6 lb

## 2022-05-25 DIAGNOSIS — J3089 Other allergic rhinitis: Secondary | ICD-10-CM

## 2022-05-25 DIAGNOSIS — Z1231 Encounter for screening mammogram for malignant neoplasm of breast: Secondary | ICD-10-CM

## 2022-05-25 DIAGNOSIS — J302 Other seasonal allergic rhinitis: Secondary | ICD-10-CM

## 2022-05-25 DIAGNOSIS — E890 Postprocedural hypothyroidism: Secondary | ICD-10-CM

## 2022-05-25 DIAGNOSIS — J4520 Mild intermittent asthma, uncomplicated: Secondary | ICD-10-CM

## 2022-05-25 DIAGNOSIS — E538 Deficiency of other specified B group vitamins: Secondary | ICD-10-CM

## 2022-05-25 DIAGNOSIS — E559 Vitamin D deficiency, unspecified: Secondary | ICD-10-CM

## 2022-05-25 DIAGNOSIS — G5793 Unspecified mononeuropathy of bilateral lower limbs: Secondary | ICD-10-CM

## 2022-05-25 DIAGNOSIS — F331 Major depressive disorder, recurrent, moderate: Secondary | ICD-10-CM

## 2022-05-25 DIAGNOSIS — U099 Post covid-19 condition, unspecified: Secondary | ICD-10-CM

## 2022-05-28 ENCOUNTER — Institutional Professional Consult (permissible substitution): Payer: Medicaid Other | Admitting: Plastic Surgery

## 2022-06-04 ENCOUNTER — Ambulatory Visit: Payer: Medicaid Other | Attending: Family Medicine

## 2022-06-04 DIAGNOSIS — J4540 Moderate persistent asthma, uncomplicated: Secondary | ICD-10-CM | POA: Diagnosis present

## 2022-06-04 DIAGNOSIS — Z8616 Personal history of COVID-19: Secondary | ICD-10-CM | POA: Diagnosis not present

## 2022-06-04 LAB — PULMONARY FUNCTION TEST ARMC ONLY
DL/VA % pred: 112 %
DL/VA: 5 ml/min/mmHg/L
DLCO unc % pred: 87 %
DLCO unc: 18.32 ml/min/mmHg
FEF 25-75 Post: 1.44 L/sec
FEF 25-75 Pre: 1.33 L/sec
FEF2575-%Change-Post: 8 %
FEF2575-%Pred-Post: 47 %
FEF2575-%Pred-Pre: 44 %
FEV1-%Change-Post: 1 %
FEV1-%Pred-Post: 63 %
FEV1-%Pred-Pre: 62 %
FEV1-Post: 1.83 L
FEV1-Pre: 1.8 L
FEV1FVC-%Change-Post: -1 %
FEV1FVC-%Pred-Pre: 80 %
FEV6-%Change-Post: 1 %
FEV6-%Pred-Post: 78 %
FEV6-%Pred-Pre: 77 %
FEV6-Post: 2.74 L
FEV6-Pre: 2.7 L
FEV6FVC-%Change-Post: 0 %
FEV6FVC-%Pred-Post: 101 %
FEV6FVC-%Pred-Pre: 101 %
FVC-%Change-Post: 2 %
FVC-%Pred-Post: 78 %
FVC-%Pred-Pre: 76 %
FVC-Post: 2.79 L
FVC-Pre: 2.71 L
Post FEV1/FVC ratio: 65 %
Post FEV6/FVC ratio: 100 %
Pre FEV1/FVC ratio: 66 %
Pre FEV6/FVC Ratio: 100 %
RV % pred: 78 %
RV: 1.24 L
TLC % pred: 81 %
TLC: 4 L

## 2022-06-04 MED ORDER — ALBUTEROL SULFATE (2.5 MG/3ML) 0.083% IN NEBU
2.5000 mg | INHALATION_SOLUTION | Freq: Once | RESPIRATORY_TRACT | Status: AC
  Administered 2022-06-04: 2.5 mg via RESPIRATORY_TRACT

## 2022-06-08 ENCOUNTER — Ambulatory Visit: Payer: Medicaid Other | Admitting: Adult Health

## 2022-06-15 ENCOUNTER — Encounter: Payer: Self-pay | Admitting: Internal Medicine

## 2022-06-16 NOTE — Patient Instructions (Signed)
Preventive Care 43-43 Years Old, Female Preventive care refers to lifestyle choices and visits with your health care provider that can promote health and wellness. Preventive care visits are also called wellness exams. What can I expect for my preventive care visit? Counseling Your health care provider may ask you questions about your: Medical history, including: Past medical problems. Family medical history. Pregnancy history. Current health, including: Menstrual cycle. Method of birth control. Emotional well-being. Home life and relationship well-being. Sexual activity and sexual health. Lifestyle, including: Alcohol, nicotine or tobacco, and drug use. Access to firearms. Diet, exercise, and sleep habits. Work and work environment. Sunscreen use. Safety issues such as seatbelt and bike helmet use. Physical exam Your health care provider will check your: Height and weight. These may be used to calculate your BMI (body mass index). BMI is a measurement that tells if you are at a healthy weight. Waist circumference. This measures the distance around your waistline. This measurement also tells if you are at a healthy weight and may help predict your risk of certain diseases, such as type 2 diabetes and high blood pressure. Heart rate and blood pressure. Body temperature. Skin for abnormal spots. What immunizations do I need?  Vaccines are usually given at various ages, according to a schedule. Your health care provider will recommend vaccines for you based on your age, medical history, and lifestyle or other factors, such as travel or where you work. What tests do I need? Screening Your health care provider may recommend screening tests for certain conditions. This may include: Lipid and cholesterol levels. Diabetes screening. This is done by checking your blood sugar (glucose) after you have not eaten for a while (fasting). Pelvic exam and Pap test. Hepatitis B test. Hepatitis C  test. HIV (human immunodeficiency virus) test. STI (sexually transmitted infection) testing, if you are at risk. Lung cancer screening. Colorectal cancer screening. Mammogram. Talk with your health care provider about when you should start having regular mammograms. This may depend on whether you have a family history of breast cancer. BRCA-related cancer screening. This may be done if you have a family history of breast, ovarian, tubal, or peritoneal cancers. Bone density scan. This is done to screen for osteoporosis. Talk with your health care provider about your test results, treatment options, and if necessary, the need for more tests. Follow these instructions at home: Eating and drinking  Eat a diet that includes fresh fruits and vegetables, whole grains, lean protein, and low-fat dairy products. Take vitamin and mineral supplements as recommended by your health care provider. Do not drink alcohol if: Your health care provider tells you not to drink. You are pregnant, may be pregnant, or are planning to become pregnant. If you drink alcohol: Limit how much you have to 0-1 drink a day. Know how much alcohol is in your drink. In the [place] one drink equals one 12 oz bottle of beer (355 mL), one 5 oz glass of wine (148 mL), or one 1 oz glass of hard liquor (44 mL). Lifestyle Brush your teeth every morning and night with fluoride toothpaste. Floss one time each day. Exercise for at least 30 minutes 5 or more days each week. Do not use any products that contain nicotine or tobacco. These products include cigarettes, chewing tobacco, and vaping devices, such as e-cigarettes. If you need help quitting, ask your health care provider. Do not use drugs. If you are sexually active, practice safe sex. Use a condom or other form of protection to   prevent STIs. If you do not wish to become pregnant, use a form of birth control. If you plan to become pregnant, see your health care provider for a  prepregnancy visit. Take aspirin only as told by your health care provider. Make sure that you understand how much to take and what form to take. Work with your health care provider to find out whether it is safe and beneficial for you to take aspirin daily. Find healthy ways to manage stress, such as: Meditation, yoga, or listening to music. Journaling. Talking to a trusted person. Spending time with friends and family. Minimize exposure to UV radiation to reduce your risk of skin cancer. Safety Always wear your seat belt while driving or riding in a vehicle. Do not drive: If you have been drinking alcohol. Do not ride with someone who has been drinking. When you are tired or distracted. While texting. If you have been using any mind-altering substances or drugs. Wear a helmet and other protective equipment during sports activities. If you have firearms in your house, make sure you follow all gun safety procedures. Seek help if you have been physically or sexually abused. What's next? Visit your health care provider once a year for an annual wellness visit. Ask your health care provider how often you should have your eyes and teeth checked. Stay up to date on all vaccines. This information is not intended to replace advice given to you by your health care provider. Make sure you discuss any questions you have with your health care provider. Document Revised: 10/16/2020 Document Reviewed: 10/16/2020 Elsevier Patient Education  2023 [employer]  

## 2022-06-16 NOTE — Progress Notes (Unsigned)
Name: Catherine Berg   MRN: [id number]    DOB: 1980-07-30   Date:06/17/2022       Progress Note  Subjective  Chief Complaint  Annual Exam  HPI  Patient presents for annual CPE.  Diet: she has been snacking during the night, usually something sweet , discussed drinking broth  Exercise: discussed importance of regular physical activity   Last Eye Exam: up to date  Last Dental Exam: up to date   [place] Visit from 05/25/2022 in [facility]  AUDIT-C Score 1      Depression: Phq 9 is  positive    06/17/2022    9:27 AM 05/25/2022    9:13 AM 02/03/2022    1:18 PM 11/17/2021    1:08 PM 09/09/2021   11:32 AM  Depression screen PHQ 2/9  Decreased Interest 2 2 3 1   $ Down, Depressed, Hopeless 1 1 3 2   $ PHQ - 2 Score 3 3 6 3   $ Altered sleeping 3 2  0   Tired, decreased energy 3 2  2   $ Change in appetite 0 0  0   Feeling bad or failure about yourself  0 0  1   Trouble concentrating 0 0  0   Moving slowly or fidgety/restless 0 0  0   Suicidal thoughts 0 0  0   PHQ-9 Score 9 7  6   $ Difficult doing work/chores  Somewhat difficult        Information is confidential and restricted. Go to Review Flowsheets to unlock data.   Hypertension: BP Readings from Last 3 Encounters:  06/17/22 130/76  05/25/22 128/82  05/12/22 (!) 148/90   Obesity: Wt Readings from Last 3 Encounters:  06/17/22 234 lb (106.1 kg)  05/25/22 233 lb 9.6 oz (106 kg)  05/12/22 236 lb (107 kg)   BMI Readings from Last 3 Encounters:  06/17/22 41.45 kg/m  05/25/22 41.38 kg/m  05/12/22 41.81 kg/m     Vaccines:   Tdap: up to date Shingrix: N/A Pneumonia: today  Flu: 2021, she will get it today  COVID-19: N/A   Hep C Screening: 01/05/19 STD testing and prevention (HIV/chl/gon/syphilis): 08/03/17, she has not been sexually active  Intimate partner violence: negative screen  Sexual History : not in a while  Menstrual History/LMP/Abnormal Bleeding: no cycles , has  Nexplanon  Discussed importance of follow up if any post-menopausal bleeding: N/A Incontinence Symptoms: sometimes she has urgency   Breast cancer:  - Last Mammogram: Ordered 05/25/22 - BRCA gene screening: N/A  Osteoporosis Prevention : Discussed high calcium and vitamin D supplementation, weight bearing exercises Bone density: N/A   Cervical cancer screening: 03/28/19, due- ordered today  Skin cancer: Discussed monitoring for atypical lesions  Colorectal cancer: N/A   Lung cancer:  Low Dose CT Chest recommended if Age 60-80 years, 20 pack-year currently smoking OR have quit w/in 15years. Patient does not qualify for screen   ECG: 05/12/22  Advanced Care Planning: A voluntary discussion about advance care planning including the explanation and discussion of advance directives.  Discussed health care proxy and Living will, and the patient was able to identify a health care proxy as brother - Chartered loss adjuster .  Patient has a  power of attorney of health care   Lipids: Lab Results  Component Value Date   CHOL 113 07/17/2020   CHOL 110 01/05/2019   CHOL 107 08/03/2017   Lab Results  Component Value Date   HDL  57 07/17/2020   HDL 59 01/05/2019   HDL 50 (L) 08/03/2017   Lab Results  Component Value Date   LDLCALC 43 07/17/2020   LDLCALC 38 01/05/2019   LDLCALC 44 08/03/2017   Lab Results  Component Value Date   TRIG 53 07/17/2020   TRIG 47 01/05/2019   TRIG 54 08/03/2017   Lab Results  Component Value Date   CHOLHDL 2.0 07/17/2020   CHOLHDL 1.9 01/05/2019   CHOLHDL 2.1 08/03/2017   No results found for: "LDLDIRECT"  Glucose: Glucose  Date Value Ref Range Status  08/13/2012 101 (H) 65 - 99 mg/dL Final   Glucose, Bld  Date Value Ref Range Status  05/07/2022 115 (H) 70 - 99 mg/dL Final    Comment:    Glucose reference range applies only to samples taken after fasting for at least 8 hours.  02/14/2022 86 70 - 99 mg/dL Final    Comment:    Glucose reference range  applies only to samples taken after fasting for at least 8 hours.  11/17/2021 83 65 - 99 mg/dL Final    Comment:    .            Fasting reference interval .     Patient Active Problem List   Diagnosis Date Noted   Asthma 05/12/2022   MDD (major depressive disorder), recurrent episode, mild ([place]) 11/17/2021   Neuropathy involving both lower extremities 11/17/2021   Perennial allergic rhinitis with seasonal variation 11/17/2021   Chronic pain of both lower extremities 09/24/2020   Sleeps in sitting position due to orthopnea 07/02/2020   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   COVID-19 long hauler 07/02/2020   Vitamin D deficiency 04/22/2020   COVID-19 long hauler manifesting chronic fatigue 04/22/2020   History of COVID-19 02/28/2020   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   Memory loss 02/28/2020   Physical deconditioning 02/28/2020   Dyspnea on exertion 02/28/2020   Neck pain, bilateral 02/28/2020   Muscle spasms of neck 02/28/2020   Arthralgia of hand 01/24/2019   CRP elevated 01/24/2019   ESR raised 01/24/2019   Class 3 severe obesity due to excess calories without serious comorbidity in adult [facility]) 01/24/2019   Palpitations 09/16/2018   Coronary artery calcification 09/16/2018   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   Bilateral leg edema 04/20/2017   Incidental lung nodule, > 13m and < 857m11/28/2018   Hives 09/02/2015   Morbid obesity ([facility]04/13/2017   Anxiety 07/09/2015   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 06/28/2015   Thyroid nodule 02/01/2015   Vitamin D deficiency disease    Vitamin B12 deficiency    History of abnormal cervical Pap smear    Allergy-induced asthma, mild intermittent, uncomplicated [id number]  Past Surgical History:  Procedure Laterality Date   CERVICAL POLYPECTOMY     CESAREAN SECTION     x 2   COLONOSCOPY WITH PROPOFOL N/A 11/14/2018   Procedure: COLONOSCOPY WITH PROPOFOL;  Surgeon: TaVirgel ManifoldMD;  [facility]  Service: Endoscopy;  Laterality: N/A;   ESOPHAGOGASTRODUODENOSCOPY (EGD) WITH PROPOFOL N/A 11/14/2018   Procedure: ESOPHAGOGASTRODUODENOSCOPY (EGD) WITH PROPOFOL;  Surgeon: TaVirgel ManifoldMD;  [facility] Service: Endoscopy;  Laterality: N/A;  Latex allergy   GIVENS CAPSULE STUDY N/A 12/27/2018   Procedure: GIVENS CAPSULE STUDY;  Surgeon: TaVirgel ManifoldMD;  Location: [facility] ENDOSCOPY;  Service: Endoscopy;  Laterality: N/A;    Family History  Problem Relation Age of Onset   Heart attack Mother [id number]  Hypertension Mother    Asthma Mother    Cancer Mother        laryngeal   Diabetes Mother        pre diabetic   Heart disease Mother    Mental illness Mother    Alcohol abuse Mother    Drug abuse Mother    Depression Mother    Anxiety disorder Mother    Bipolar disorder Mother    Learning disabilities Brother    ADD / ADHD Brother    Asthma Son    Hyperlipidemia Brother    Alcohol abuse Father    Heart disease Maternal Grandmother        heart attack, pacemaker   Heart attack Maternal Grandmother    Hypertension Maternal Grandmother    Hypertension Maternal Grandfather    Heart disease Paternal Grandmother        couple of major open heart surgeries, leaking valves   Hypertension Paternal Grandmother    Hypertension Paternal Grandfather    Breast cancer Neg Hx     Social History   Socioeconomic History   Marital status: Single    Spouse name: Not on file   Number of children: 2   Years of education: 11   Highest education level: High school graduate  Occupational History   Not on file  Tobacco Use   Smoking status: Never   Smokeless tobacco: Never  Vaping Use   Vaping Use: Never used  Substance and Sexual Activity   Alcohol use: No    Alcohol/week: 0.0 standard drinks of alcohol   Drug use: No   Sexual activity: Yes    Partners: Male    Birth control/protection: None  Other Topics Concern   Not on file  Social History Narrative    Not on file   Social Determinants of Health   Financial Resource Strain: Medium Risk (06/17/2022)   Overall Financial Resource Strain (CARDIA)    Difficulty of Paying Living Expenses: Somewhat hard  Food Insecurity: Food Insecurity Present (06/17/2022)   Hunger Vital Sign    Worried About Running Out of Food in the Last Year: Sometimes true    Ran Out of Food in the Last Year: Patient refused  Transportation Needs: No Transportation Needs (06/17/2022)   PRAPARE - Hydrologist (Medical): No    Lack of Transportation (Non-Medical): No  Physical Activity: Inactive (06/17/2022)   Exercise Vital Sign    Days of Exercise per Week: 0 days    Minutes of Exercise per Session: 0 min  Stress: Stress Concern Present (06/17/2022)   [place]    Feeling of Stress : Very much  Social Connections: Moderately Isolated (06/17/2022)   Social Connection and Isolation Panel [NHANES]    Frequency of Communication with Friends and Family: Twice a week    Frequency of Social Gatherings with Friends and Family: Once a week    Attends Religious Services: Never    Marine scientist or Organizations: Yes    Attends Archivist Meetings: 1 to 4 times per year    Marital Status: Never married  Intimate Partner Violence: Not At Risk (06/17/2022)   Humiliation, Afraid, Rape, and Kick questionnaire    Fear of Current or Ex-Partner: No    Emotionally Abused: No    Physically Abused: No    Sexually Abused: No     Current Outpatient Medications:    albuterol (VENTOLIN HFA) 108 (  90 Base) MCG/ACT inhaler, Inhale 2 puffs into the lungs every 6 (six) hours as needed for wheezing or shortness of breath., Disp: 18 g, Rfl: 1   amLODipine (NORVASC) 5 MG tablet, Take 1 tablet (5 mg total) by mouth daily., Disp: 90 tablet, Rfl: 0   Azelastine HCl 137 MCG/SPRAY SOLN, SPRAY TWO SPRAYS IN EACH NOSTRIL TWICE DAILY., Disp:  30 mL, Rfl: 1   buPROPion (WELLBUTRIN XL) 150 MG 24 hr tablet, 150 mg daily for one day, then 300 mg daily for one day, then 450 mg daily, Disp: 90 tablet, Rfl: 1   Cyanocobalamin (B-12) 1000 MCG SUBL, Place 1 tablet under the tongue daily., Disp: 90 tablet, Rfl: 1   DULoxetine (CYMBALTA) 30 MG capsule, 30 mg daily for one day, then 60 mg daily for one day, then 90 mg daily, Disp: 90 capsule, Rfl: 1   etonogestrel (NEXPLANON) 68 MG IMPL implant, Inject 68 mg into the skin once., Disp: , Rfl:    levothyroxine (SYNTHROID) 75 MCG tablet, Take 75 mcg by mouth daily before breakfast., Disp: , Rfl:    mometasone-formoterol (DULERA) 200-5 MCG/ACT AERO, Inhale 2 puffs into the lungs in the morning and at bedtime., Disp: 1 each, Rfl: 11   montelukast (SINGULAIR) 10 MG tablet, Take 1 tablet (10 mg total) by mouth at bedtime., Disp: 90 tablet, Rfl: 1   topiramate (TOPAMAX) 25 MG tablet, Take 1 tablet (25 mg total) by mouth at bedtime., Disp: 90 tablet, Rfl: 3   Vitamin D, Ergocalciferol, (DRISDOL) 1.25 MG (50000 UNIT) CAPS capsule, Take 1 capsule (50,000 Units total) by mouth every 7 (seven) days., Disp: 12 capsule, Rfl: 1  Allergies  Allergen Reactions   Latex Hives    (Balloons, condoms, underwear elastic, gloves)   Shellfish Allergy Hives     ROS  Constitutional: Negative for fever or weight change.  Respiratory: Negative for cough and shortness of breath.   Cardiovascular: Negative for chest pain, she has noticed  palpitations over the past two weeks and will follow up with cardiologist .  Gastrointestinal: Negative for abdominal pain, no bowel changes.  Musculoskeletal: Negative for gait problem or joint swelling.  Skin: Negative for rash.  Neurological: Negative for dizziness or headache.  No other specific complaints in a complete review of systems (except as listed in HPI above).   Objective  Vitals:   06/17/22 0934  BP: 130/76  Pulse: 91  Resp: 16  SpO2: 100%  Weight: 234 lb  (106.1 kg)  Height: 5' 3"$  (1.6 m)    Body mass index is 41.45 kg/m.  Physical Exam  Constitutional: Patient appears well-developed and well-nourished. No distress.  HENT: Head: Normocephalic and atraumatic. Ears: B TMs ok, no erythema or effusion; Nose: Nose normal. Mouth/Throat: Oropharynx is clear and moist. No oropharyngeal exudate.  Eyes: Conjunctivae and EOM are normal. Pupils are equal, round, and reactive to light. No scleral icterus.  Neck: Normal range of motion. Neck supple. No JVD present. No thyromegaly present.  Cardiovascular: Normal rate, regular rhythm and normal heart sounds.  No murmur heard. No BLE edema. Pulmonary/Chest: Effort normal and breath sounds normal. No respiratory distress. Abdominal: Soft. Bowel sounds are normal, no distension. There is no tenderness. no masses Breast: no lumps or masses, no nipple discharge or rashes FEMALE GENITALIA:  External genitalia normal External urethra normal Vaginal vault normal without discharge or lesions Cervix normal without discharge or lesions Bimanual exam normal without masses RECTAL: not done  Musculoskeletal: Normal range of motion, no  joint effusions. No gross deformities Neurological: he is alert and oriented to person, place, and time. No cranial nerve deficit. Coordination, balance, strength, speech and gait are normal.  Skin: Skin is warm and dry. No rash noted. No erythema.  Psychiatric: Patient has a normal mood and affect. behavior is normal. Judgment and thought content normal.   Recent Results (from the past 2160 hour(s))  Basic metabolic panel     Status: Abnormal   Collection Time: 05/07/22 11:08 PM  Result Value Ref Range   Sodium 134 (L) 135 - 145 mmol/L   Potassium 3.6 3.5 - 5.1 mmol/L   Chloride 104 98 - 111 mmol/L   CO2 24 22 - 32 mmol/L   Glucose, Bld 115 (H) 70 - 99 mg/dL    Comment: Glucose reference range applies only to samples taken after fasting for at least 8 hours.   BUN 16 6 - 20  mg/dL   Creatinine, Ser 0.90 0.44 - 1.00 mg/dL   Calcium 8.9 8.9 - 10.3 mg/dL   GFR, Estimated >60 >60 mL/min    Comment: (NOTE) Calculated using the CKD-EPI Creatinine Equation (2021)    Anion gap 6 5 - 15    Comment: Performed at [facility], [address] [place], [place] [address]  CBC     Status: None   Collection Time: 05/07/22 11:08 PM  Result Value Ref Range   WBC 9.2 4.0 - 10.5 K/uL   RBC 4.28 3.87 - 5.11 MIL/uL   Hemoglobin 12.3 12.0 - 15.0 g/dL   HCT 37.6 36.0 - 46.0 %   MCV 87.9 80.0 - 100.0 fL   MCH 28.7 26.0 - 34.0 pg   MCHC 32.7 30.0 - 36.0 g/dL   RDW 13.2 11.5 - 15.5 %   Platelets 320 150 - 400 K/uL   nRBC 0.0 0.0 - 0.2 %    Comment: Performed at [facility], [place], [place] [address]  Troponin I (High Sensitivity)     Status: None   Collection Time: 05/07/22 11:08 PM  Result Value Ref Range   Troponin I (High Sensitivity) 2 <18 ng/L    Comment: (NOTE) Elevated high sensitivity troponin I (hsTnI) values and significant  changes across serial measurements may suggest ACS but many other  chronic and acute conditions are known to elevate hsTnI results.  Refer to the "Links" section for chest pain algorithms and additional  guidance. Performed at [facility], [place], [place] [address]   Troponin I (High Sensitivity)     Status: None   Collection Time: 05/08/22  1:13 AM  Result Value Ref Range   Troponin I (High Sensitivity) 3 <18 ng/L    Comment: (NOTE) Elevated high sensitivity troponin I (hsTnI) values and significant  changes across serial measurements may suggest ACS but many other  chronic and acute conditions are known to elevate hsTnI results.  Refer to the "Links" section for chest pain algorithms and additional  guidance. Performed at [facility], [address] [place], [place] [address]   Resp panel by RT-PCR (RSV, Flu A&B, Covid) Anterior Nasal Swab     Status:  None   Collection Time: 05/08/22  2:43 AM   Specimen: Anterior Nasal Swab  Result Value Ref Range   SARS Coronavirus 2 by RT PCR NEGATIVE NEGATIVE    Comment: (NOTE) SARS-CoV-2 target nucleic acids are NOT DETECTED.  The SARS-CoV-2 RNA is generally detectable in upper respiratory specimens during the  acute phase of infection. The lowest concentration of SARS-CoV-2 viral copies this assay can detect is 138 copies/mL. A negative result does not preclude SARS-Cov-2 infection and should not be used as the sole basis for treatment or other patient management decisions. A negative result may occur with  improper specimen collection/handling, submission of specimen other than nasopharyngeal swab, presence of viral mutation(s) within the areas targeted by this assay, and inadequate number of viral copies(<138 copies/mL). A negative result must be combined with clinical observations, patient history, and epidemiological information. The expected result is Negative.  Fact Sheet for Patients:  EntrepreneurPulse.com.au  Fact Sheet for Healthcare Providers:  IncredibleEmployment.be  This test is no t yet approved or cleared by the [place] FDA and  has been authorized for detection and/or diagnosis of SARS-CoV-2 by FDA under an Emergency Use Authorization (EUA). This EUA will remain  in effect (meaning this test can be used) for the duration of the COVID-19 declaration under Section 564(b)(1) of the Act, 21 U.S.C.section 360bbb-3(b)(1), unless the authorization is terminated  or revoked sooner.       Influenza A by PCR NEGATIVE NEGATIVE   Influenza B by PCR NEGATIVE NEGATIVE    Comment: (NOTE) The Xpert Xpress SARS-CoV-2/FLU/RSV plus assay is intended as an aid in the diagnosis of influenza from Nasopharyngeal swab specimens and should not be used as a sole basis for treatment. Nasal washings and aspirates are unacceptable for Xpert Xpress  SARS-CoV-2/FLU/RSV testing.  Fact Sheet for Patients: EntrepreneurPulse.com.au  Fact Sheet for Healthcare Providers: IncredibleEmployment.be  This test is not yet approved or cleared by the [place] FDA and has been authorized for detection and/or diagnosis of SARS-CoV-2 by FDA under an Emergency Use Authorization (EUA). This EUA will remain in effect (meaning this test can be used) for the duration of the COVID-19 declaration under Section 564(b)(1) of the Act, 21 U.S.C. section 360bbb-3(b)(1), unless the authorization is terminated or revoked.     Resp Syncytial Virus by PCR NEGATIVE NEGATIVE    Comment: (NOTE) Fact Sheet for Patients: EntrepreneurPulse.com.au  Fact Sheet for Healthcare Providers: IncredibleEmployment.be  This test is not yet approved or cleared by the [place] FDA and has been authorized for detection and/or diagnosis of SARS-CoV-2 by FDA under an Emergency Use Authorization (EUA). This EUA will remain in effect (meaning this test can be used) for the duration of the COVID-19 declaration under Section 564(b)(1) of the Act, 21 U.S.C. section 360bbb-3(b)(1), unless the authorization is terminated or revoked.  Performed at [facility], [address] [place], [place] [address]   D-dimer, quantitative     Status: None   Collection Time: 05/08/22  2:43 AM  Result Value Ref Range   D-Dimer, Quant 0.28 0.00 - 0.50 ug/mL-FEU    Comment: (NOTE) At the manufacturer cut-off value of 0.5 g/mL FEU, this assay has a negative predictive value of 95-100%.This assay is intended for use in conjunction with a clinical pretest probability (PTP) assessment model to exclude pulmonary embolism (PE) and deep venous thrombosis (DVT) in outpatients suspected of PE or DVT. Results should be correlated with clinical presentation. Performed at [facility], [address] [place],  [address]   Nitric oxide     Status: None   Collection Time: 05/12/22 12:20 PM  Result Value Ref Range   Nitric Oxide 18      Fall Risk:    06/17/2022    9:27 AM 05/25/2022    9:12 AM 02/03/2022  1:18 PM 11/17/2021    1:08 PM 06/17/2021    3:00 PM  [place] in the past year? 1 1 1 1 $ 0  Number falls in past yr: 0 0 0 0 0  Injury with Fall? 0 0 0 0 0  Risk for fall due to : No Fall Risks History of fall(s)  No Fall Risks   Follow up Falls prevention discussed Falls prevention discussed;Education provided;Falls evaluation completed  Falls prevention discussed      Functional Status Survey: Is the patient deaf or have difficulty hearing?: No Does the patient have difficulty seeing, even when wearing glasses/contacts?: Yes Does the patient have difficulty concentrating, remembering, or making decisions?: Yes Does the patient have difficulty walking or climbing stairs?: No Does the patient have difficulty dressing or bathing?: No Does the patient have difficulty doing errands alone such as visiting a doctor's office or shopping?: No   Assessment & Plan  1. Well adult exam  - Cytology - PAP - COMPLETE METABOLIC PANEL WITH GFR - Hemoglobin A1c - VITAMIN D 25 Hydroxy (Vit-D Deficiency, Fractures) - B12 and Folate Panel - TSH - Ferritin - Lipid panel  2. Cervical cancer screening  - Cytology - PAP  3. Vitamin D deficiency  - VITAMIN D 25 Hydroxy (Vit-D Deficiency, Fractures)  4. Vitamin B12 deficiency  - B12 and Folate Panel  5. Long-term use of high-risk medication  - COMPLETE METABOLIC PANEL WITH GFR  6. History of iron deficiency  - Ferritin  7. Adult hypothyroidism  - TSH  8. Diabetes mellitus screening  - Hemoglobin A1c  9. Needs flu shot  - Flu Vaccine QUAD 6+ mos PF IM (Fluarix Quad PF)  10. Need for pneumococcal 20-valent conjugate vaccination  - Pneumococcal conjugate vaccine 20-valent (Prevnar 20)  11. Lipid  screening  - Lipid panel    -USPSTF grade A and B recommendations reviewed with patient; age-appropriate recommendations, preventive care, screening tests, etc discussed and encouraged; healthy living encouraged; see AVS for patient education given to patient -Discussed importance of 150 minutes of physical activity weekly, eat two servings of fish weekly, eat one serving of tree nuts ( cashews, pistachios, pecans, almonds.Marland Kitchen) every other day, eat 6 servings of fruit/vegetables daily and drink plenty of water and avoid sweet beverages.   -Reviewed Health Maintenance: Yes.

## 2022-06-17 ENCOUNTER — Other Ambulatory Visit (HOSPITAL_COMMUNITY)
Admission: RE | Admit: 2022-06-17 | Discharge: 2022-06-17 | Disposition: A | Discharge status: Home / Self Care | Payer: Medicaid Other | Source: Ambulatory Visit | Attending: Family Medicine | Admitting: Family Medicine

## 2022-06-17 ENCOUNTER — Encounter: Payer: Self-pay | Admitting: Family Medicine

## 2022-06-17 ENCOUNTER — Ambulatory Visit (INDEPENDENT_AMBULATORY_CARE_PROVIDER_SITE_OTHER): Payer: Medicaid Other | Admitting: Family Medicine

## 2022-06-17 VITALS — BP 130/76 | HR 91 | Resp 16 | Ht 63.0 in | Wt 234.0 lb

## 2022-06-17 DIAGNOSIS — Z124 Encounter for screening for malignant neoplasm of cervix: Secondary | ICD-10-CM

## 2022-06-17 DIAGNOSIS — E559 Vitamin D deficiency, unspecified: Secondary | ICD-10-CM | POA: Diagnosis not present

## 2022-06-17 DIAGNOSIS — Z13220 Encounter for screening for lipoid disorders: Secondary | ICD-10-CM

## 2022-06-17 DIAGNOSIS — Z0000 Encounter for general adult medical examination without abnormal findings: Secondary | ICD-10-CM | POA: Diagnosis present

## 2022-06-17 DIAGNOSIS — Z131 Encounter for screening for diabetes mellitus: Secondary | ICD-10-CM

## 2022-06-17 DIAGNOSIS — Z8639 Personal history of other endocrine, nutritional and metabolic disease: Secondary | ICD-10-CM

## 2022-06-17 DIAGNOSIS — E538 Deficiency of other specified B group vitamins: Secondary | ICD-10-CM | POA: Diagnosis not present

## 2022-06-17 DIAGNOSIS — Z23 Encounter for immunization: Secondary | ICD-10-CM

## 2022-06-17 DIAGNOSIS — E039 Hypothyroidism, unspecified: Secondary | ICD-10-CM

## 2022-06-17 DIAGNOSIS — Z79899 Other long term (current) drug therapy: Secondary | ICD-10-CM

## 2022-06-18 LAB — HEMOGLOBIN A1C
Hgb A1c MFr Bld: 5.5 % of total Hgb (ref <5.7)
Mean Plasma Glucose: 111 mg/dL
eAG (mmol/L): 6.2 mmol/L

## 2022-06-18 LAB — COMPLETE METABOLIC PANEL WITH GFR
AG Ratio: 1.2 (calc) (ref 1.0–2.5)
ALT: 15 U/L (ref 6–29)
AST: 15 U/L (ref 10–30)
Albumin: 4.2 g/dL (ref 3.6–5.1)
Alkaline phosphatase (APISO): 63 U/L (ref 31–125)
BUN: 10 mg/dL (ref 7–25)
CO2: 24 mmol/L (ref 20–32)
Calcium: 9.3 mg/dL (ref 8.6–10.2)
Chloride: 104 mmol/L (ref 98–110)
Creat: 0.74 mg/dL (ref 0.50–0.99)
Globulin: 3.6 g/dL (calc) (ref 1.9–3.7)
Glucose, Bld: 80 mg/dL (ref 65–99)
Potassium: 4.4 mmol/L (ref 3.5–5.3)
Sodium: 138 mmol/L (ref 135–146)
Total Bilirubin: 0.2 mg/dL (ref 0.2–1.2)
Total Protein: 7.8 g/dL (ref 6.1–8.1)
eGFR: 104 mL/min/{1.73_m2} (ref >=60)

## 2022-06-18 LAB — LIPID PANEL
Cholesterol: 118 mg/dL (ref <200)
HDL: 64 mg/dL (ref >=50)
LDL Cholesterol (Calc): 40 mg/dL (calc)
Non-HDL Cholesterol (Calc): 54 mg/dL (calc) (ref <130)
Total CHOL/HDL Ratio: 1.8 (calc) (ref <5.0)
Triglycerides: 55 mg/dL (ref <150)

## 2022-06-18 LAB — TSH: TSH: 4.55 mIU/L — ABNORMAL HIGH

## 2022-06-18 LAB — FERRITIN: Ferritin: 49 ng/mL (ref 16–232)

## 2022-06-18 LAB — VITAMIN D 25 HYDROXY (VIT D DEFICIENCY, FRACTURES): Vit D, 25-Hydroxy: 10 ng/mL — ABNORMAL LOW (ref 30–100)

## 2022-06-18 LAB — B12 AND FOLATE PANEL
Folate: 6.1 ng/mL
Vitamin B-12: 299 pg/mL (ref 200–1100)

## 2022-06-19 LAB — CYTOLOGY - PAP
Comment: NEGATIVE
Diagnosis: NEGATIVE
High risk HPV: NEGATIVE

## 2022-06-22 ENCOUNTER — Telehealth: Payer: Medicaid Other | Admitting: Physician Assistant

## 2022-06-22 ENCOUNTER — Encounter: Payer: Self-pay | Admitting: Family Medicine

## 2022-06-22 DIAGNOSIS — B001 Herpesviral vesicular dermatitis: Secondary | ICD-10-CM | POA: Diagnosis not present

## 2022-06-23 ENCOUNTER — Ambulatory Visit (INDEPENDENT_AMBULATORY_CARE_PROVIDER_SITE_OTHER): Payer: Medicaid Other | Admitting: Internal Medicine

## 2022-06-23 ENCOUNTER — Encounter: Payer: Self-pay | Admitting: Internal Medicine

## 2022-06-23 VITALS — BP 132/74 | HR 85 | Temp 97.7°F | Ht 63.0 in | Wt 234.4 lb

## 2022-06-23 DIAGNOSIS — J449 Chronic obstructive pulmonary disease, unspecified: Secondary | ICD-10-CM

## 2022-06-23 MED ORDER — VALACYCLOVIR HCL 1 G PO TABS: 2000.0000 mg | ORAL_TABLET | Freq: Two times a day (BID) | ORAL | 0 refills | Status: AC

## 2022-06-23 NOTE — Progress Notes (Signed)
[facility] Consultation     Date: 06/23/2022  MRN# [id number] Catherine Berg 1981/03/15   Catherine Berg is a 42 y.o. old female seen in consultation for chief complaint of:   CBC 09/14/18>>122 HST 07/05/18>> AHI of 1.4. **CT chest 03/31/2018>>  there is a small 4 mm or less nodule in the right lower lobe peripherally. Chief Complaint  Patient presents with   Follow-up    No current sx.     HPI:   Previous history and diagnosis of asthma with obesity PFTs February 2024 reviewed with patient in detail FEV1 62% predicted with a reduced ratio-findings consistent with moderate obstructive lung disease On her inhaled corticosteroid and long-acting beta agonist Dulera  No ER admissions over the last 6 months  CT imaging reviewed with patient Patient would like repeat CT chest to assess right lung nodule Repeat CT chest May 2022 showed stable pulmonary nodules radiology recommends no further workup    She does have a dog and allergic to dogs Not in the bedroom Has carpet in her bedroom    No exacerbation at this time No evidence of heart failure at this time No evidence or signs of infection at this time No respiratory distress No fevers, chills, nausea, vomiting, diarrhea No evidence of lower extremity edema No evidence hemoptysis      Social Hx:   Social History   Tobacco Use   Smoking status: Never   Smokeless tobacco: Never  Vaping Use   Vaping Use: Never used  Substance Use Topics   Alcohol use: No    Alcohol/week: 0.0 standard drinks of alcohol   Drug use: No   Medication:    Current Outpatient Medications:    albuterol (VENTOLIN HFA) 108 (90 Base) MCG/ACT inhaler, Inhale 2 puffs into the lungs every 6 (six) hours as needed for wheezing or shortness of breath., Disp: 18 g, Rfl: 1   amLODipine (NORVASC) 5 MG tablet, Take 1 tablet (5 mg total) by mouth daily., Disp: 90 tablet, Rfl: 0   Azelastine HCl 137 MCG/SPRAY SOLN, SPRAY TWO  SPRAYS IN EACH NOSTRIL TWICE DAILY., Disp: 30 mL, Rfl: 1   buPROPion (WELLBUTRIN XL) 150 MG 24 hr tablet, 150 mg daily for one day, then 300 mg daily for one day, then 450 mg daily, Disp: 90 tablet, Rfl: 1   Cyanocobalamin (B-12) 1000 MCG SUBL, Place 1 tablet under the tongue daily., Disp: 90 tablet, Rfl: 1   DULoxetine (CYMBALTA) 30 MG capsule, 30 mg daily for one day, then 60 mg daily for one day, then 90 mg daily, Disp: 90 capsule, Rfl: 1   etonogestrel (NEXPLANON) 68 MG IMPL implant, Inject 68 mg into the skin once., Disp: , Rfl:    levothyroxine (SYNTHROID) 75 MCG tablet, Take 75 mcg by mouth daily before breakfast., Disp: , Rfl:    mometasone-formoterol (DULERA) 200-5 MCG/ACT AERO, Inhale 2 puffs into the lungs in the morning and at bedtime., Disp: 1 each, Rfl: 11   montelukast (SINGULAIR) 10 MG tablet, Take 1 tablet (10 mg total) by mouth at bedtime., Disp: 90 tablet, Rfl: 1   topiramate (TOPAMAX) 25 MG tablet, Take 1 tablet (25 mg total) by mouth at bedtime., Disp: 90 tablet, Rfl: 3   valACYclovir (VALTREX) 1000 MG tablet, Take 2 tablets (2,000 mg total) by mouth 2 (two) times daily for 1 day., Disp: 4 tablet, Rfl: 0   Vitamin D, Ergocalciferol, (DRISDOL) 1.25 MG (50000 UNIT) CAPS capsule, Take 1 capsule (50,000 Units  total) by mouth every 7 (seven) days., Disp: 12 capsule, Rfl: 1   Allergies:  Latex and Shellfish allergy     Review of Systems: Gen:  Denies  fever, sweats, chills weight loss  HEENT: Denies blurred vision, double vision, ear pain, eye pain, hearing loss, nose bleeds, sore throat Cardiac:  No dizziness, chest pain or heaviness, chest tightness,edema, No JVD Resp:   No cough, -sputum production, -shortness of breath,-wheezing, -hemoptysis,  Other:  All other systems negative  BP 132/74 (BP Location: Left Arm, Cuff Size: Normal)   Pulse 85   Temp 97.7 F (36.5 C) (Temporal)   Ht 5' 3"$  (1.6 m)   Wt 234 lb 6.4 oz (106.3 kg)   SpO2 97%   BMI 41.52 kg/m     Physical Examination:   General Appearance: No distress  EYES PERRLA, EOM intact.   NECK Supple, No JVD Pulmonary: normal breath sounds, No wheezing.  CardiovascularNormal S1,S2.  No m/r/g.   Abdomen: Benign, Soft, non-tender. ALL OTHER ROS ARE NEGATIVE    ALL OTHER ROS ARE NEGATIVE   --------------------------------------------------------  Chemistries  Recent Labs  Lab 06/17/22 1001  NA 138  K 4.4  CL 104  CO2 24  GLUCOSE 80  BUN 10  CREATININE 0.74  CALCIUM 9.3  AST 15  ALT 15  BILITOT 0.2   ------------------------------------------------------------------------------------------------------------------   Assessment and Plan:  42 year old pleasant African-American female with a history of asthma/COPD and obesity with previous diagnosis of COVID-19 infection who had been noncompliant with inhaler therapy 6 months ago however restarted her inhaler therapy 2 months ago and her symptoms are controlled   No exacerbation at this time No evidence of heart failure at this time No evidence or signs of infection at this time No respiratory distress No fevers, chills, nausea, vomiting, diarrhea No evidence of lower extremity edema No evidence hemoptysis  Pulmonary function test reviewed with patient in detail  FEV1 62% predicted  Signifies moderate obstructive lung disease  Continue inhalers as prescribed with Dulera and albuterol as needed   Previous history of lung nodule lung nodule. It has been deemed benign 4 mm nodule in the right lower lobe incidentally found in CT chest in 2018 repeat chest in 2019 did not show any interval changes therefore it was deemed necessary not to follow-up with CT scans but will discuss with patient Repeat CT chest to assess lung nodule      MEDICATION ADJUSTMENTS/LABS AND TESTS ORDERED: Please inhalers as prescribed  recommend weight loss We reviewed your lung function test today Avoid secondhand smoke Avoid SICK  contacts Recommend  Masking  when appropriate Recommend Keep up-to-date with vaccinations ALBUTEROL AS NEEDED  AVOID POLLEN, SMOKE, DUST, AVOID DOGS as much as you can   CURRENT MEDICATIONS REVIEWED AT LENGTH WITH PATIENT TODAY   Patient  satisfied with Plan of action and management. All questions answered  Follow-up 1 year  TOTAL TIME SPENT  32 mins   Axle Parfait Patricia Pesa, M.D.  [facility]  Medical Director [place] Director [facility] Cardio-Pulmonary Department

## 2022-06-23 NOTE — Progress Notes (Signed)
We are sorry that you are not feeling well.  Here is how we plan to help!  Based on what you have shared with me it does look like you have a viral infection.    Most cold sores or fever blisters are small fluid filled blisters around the mouth caused by herpes simplex virus.  The most common strain of the virus causing cold sores is herpes simplex virus 1.  It can be spread by skin contact, sharing eating utensils, or even sharing towels.  Cold sores are contagious to other people until dry. (Approximately 5-7 days).  Wash your hands. You can spread the virus to your eyes through handling your contact lenses after touching the lesions.  Most people experience pain at the sight or tingling sensations in their lips that may begin before the ulcers erupt.  Herpes simplex is treatable but not curable.  It may lie dormant for a long time and then reappear due to stress or prolonged sun exposure.  Many patients have success in treating their cold sores with an over the counter topical called Abreva.  You may apply the cream up to 5 times daily (maximum 10 days) until healing occurs.  If you would like to use an oral antiviral medication to speed the healing of your cold sore, I have sent a prescription to your local pharmacy Valacyclovir 2 gm take one by mouth twice a day for 1 day    HOME CARE:  Wash your hands frequently. Do not pick at or rub the sore. Don't open the blisters. Avoid kissing other people during this time. Avoid sharing drinking glasses, eating utensils, or razors. Do not handle contact lenses unless you have thoroughly washed your hands with soap and warm water! Avoid oral sex during this time.  Herpes from sores on your mouth can spread to your partner's genital area. Avoid contact with anyone who has eczema or a weakened immune system. Cold sores are often triggered by exposure to intense sunlight, use a lip balm containing a sunscreen (SPF 30 or higher).  GET HELP RIGHT AWAY  IF:  Blisters look infected. Blisters occur near or in the eye. Symptoms last longer than 10 days. Your symptoms become worse.  MAKE SURE YOU:  Understand these instructions. Will watch your condition. Will get help right away if you are not doing well or get worse.    Your e-visit answers were reviewed by a board certified advanced clinical practitioner to complete your personal care plan.  Depending upon the condition, your plan could have  Included both over the counter or prescription medications.    Please review your pharmacy choice.  Be sure that the pharmacy you have chosen is open so that you can pick up your prescription now.  If there is a problem you can message your provider in [place] to have the prescription routed to another pharmacy.    Your safety is important to [place].  If you have drug allergies check our prescription carefully.  For the next 24 hours you can use MyChart to ask questions about today's visit, request a non-urgent call back, or ask for a work or school excuse from your e-visit provider.  You will get an email in the next two days asking about your experience.  I hope that your e-visit has been valuable and will speed your recovery.  I have spent 5 minutes in review of e-visit questionnaire, review and updating patient chart, medical decision making and response to patient.  Mar Daring, PA-C

## 2022-06-23 NOTE — Patient Instructions (Signed)
Your breathing is doing great continue  Please inhalers as prescribed  recommend weight loss  We reviewed your lung function test today  Avoid secondhand smoke Avoid SICK contacts Recommend  Masking  when appropriate Recommend Keep up-to-date with vaccinations

## 2022-07-07 DIAGNOSIS — E890 Postprocedural hypothyroidism: Secondary | ICD-10-CM | POA: Diagnosis not present

## 2022-07-07 DIAGNOSIS — E559 Vitamin D deficiency, unspecified: Secondary | ICD-10-CM | POA: Diagnosis not present

## 2022-07-30 ENCOUNTER — Ambulatory Visit: Payer: Medicaid Other | Admitting: Nurse Practitioner

## 2022-07-30 ENCOUNTER — Other Ambulatory Visit: Payer: Self-pay | Admitting: Adult Health

## 2022-07-30 DIAGNOSIS — J4520 Mild intermittent asthma, uncomplicated: Secondary | ICD-10-CM

## 2022-08-11 ENCOUNTER — Encounter
Payer: Medicare Other | Attending: Physical Medicine and Rehabilitation | Admitting: Physical Medicine and Rehabilitation

## 2022-08-11 VITALS — BP 139/88 | HR 75 | Ht 63.0 in | Wt 236.0 lb

## 2022-08-11 DIAGNOSIS — M79604 Pain in right leg: Secondary | ICD-10-CM | POA: Diagnosis present

## 2022-08-11 DIAGNOSIS — G8929 Other chronic pain: Secondary | ICD-10-CM | POA: Diagnosis present

## 2022-08-11 DIAGNOSIS — U099 Post covid-19 condition, unspecified: Secondary | ICD-10-CM | POA: Insufficient documentation

## 2022-08-11 DIAGNOSIS — M79605 Pain in left leg: Secondary | ICD-10-CM | POA: Insufficient documentation

## 2022-08-11 DIAGNOSIS — G609 Hereditary and idiopathic neuropathy, unspecified: Secondary | ICD-10-CM | POA: Diagnosis present

## 2022-08-11 DIAGNOSIS — G9332 Myalgic encephalomyelitis/chronic fatigue syndrome: Secondary | ICD-10-CM | POA: Insufficient documentation

## 2022-08-11 MED ORDER — METFORMIN HCL 500 MG PO TABS: 500.0000 mg | ORAL_TABLET | Freq: Every day | ORAL | 3 refills | Status: DC

## 2022-08-11 MED ORDER — TOPIRAMATE 50 MG PO TABS
50.0000 mg | ORAL_TABLET | Freq: Every evening | ORAL | 3 refills | Status: DC
Start: 2022-08-11 — End: 2022-09-22

## 2022-08-11 NOTE — Patient Instructions (Addendum)
B6 50-100mg   Fish oil [place] organic  Vitamin K rich foods   Foods that may reduce pain: 1) Ginger (especially studied for arthritis)- reduce leukotriene production to decrease inflammation 2) Blueberries- high in phytonutrients that decrease inflammation 3) Salmon- marine omega-3s reduce joint swelling and pain 4) Pumpkin seeds- reduce inflammation 5) dark chocolate- reduces inflammation 6) turmeric- reduces inflammation 7) tart cherries - reduce pain and stiffness 8) extra virgin olive oil - its compound olecanthal helps to block prostaglandins  9) chili peppers- can be eaten or applied topically via capsaicin 10) mint- helpful for headache, muscle aches, joint pain, and itching 11) garlic- reduces inflammation  Link to further information on diet for chronic pain: [email or web address]

## 2022-08-11 NOTE — Progress Notes (Signed)
Subjective:    Patient ID: Catherine Berg, female    DOB: 30-Jun-1980, 42 y.o.   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 It makes her sleepy.  -she has tried a couple of things but nothing has helped -pain feels burning  -extends from her thighs to ankle -she tries to rest her legs so they do not hurt as badly at night.  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  -the lack of control really bothers her -some days this really gets to her -she forgets to pay bills if she does not do it right away.  -she feels that not having insurance has put her in behind from getting better -she talked to her brother on the way up here.   2) confusion:  -it is embarrassing to her -she did speech language therapy, she has not been able to do any more because of lack of insurance -she holds back on her medicine when she notes it affecting her cognition -she feels this is one of her top challenges daily.  -she had been out of work for a long time before she came to see me -they put it in that she resigned her work so she lost her benefits.  -she saw Dr. Kieth Brightly.   3) Peripheral neuropathy -she does not have insurance so has not had any medication since she cannot afford it -she is hoping for medicaid expansion in December -she gets long term disability through her job, but SSI was denied- she is hoping they will change her mind. If she could get this she would get medicaid too. 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 Because she has assets (she has a home, she has a car, and her son has a car), they make it difficult for her to get any services.  -her feet have  itching sensation in the top really bad -today has been really hurting.  -feels separated up the legs  4) Bilateral ankle pain   Pain Inventory Average Pain 8 Pain Right Now 7 My pain is dull and aching  In the last 24 hours, has pain interfered with the following? General activity 1 Relation with others 3 Enjoyment of life 1 What TIME of day is your pain at its worst? varies Sleep (in general) Poor  Pain is worse with: inactivity Pain improves with: medication Relief from Meds: 2    Family History  Problem Relation Age of Onset   Heart attack Mother 60   Hypertension Mother    Asthma Mother    Cancer Mother        laryngeal   Diabetes Mother        pre diabetic   Heart disease Mother    Mental illness Mother    Alcohol abuse Mother    Drug abuse Mother    Depression Mother    Anxiety disorder Mother    Bipolar disorder Mother    Learning disabilities Brother    ADD / ADHD Brother    Asthma Son    Hyperlipidemia Brother    Alcohol abuse Father    Heart disease  Maternal Grandmother        heart attack, pacemaker   Heart attack Maternal Grandmother    Hypertension Maternal Grandmother    Hypertension Maternal Grandfather    Heart disease Paternal Grandmother        couple of major open heart surgeries, leaking valves   Hypertension Paternal Grandmother    Hypertension Paternal Grandfather    Breast cancer Neg Hx    Social History   Socioeconomic History   Marital status: Single    Spouse name: Not on file   Number of children: 2   Years of education: 11   Highest education level: High school graduate  Occupational History   Not on file  Tobacco Use   Smoking status: Never   Smokeless tobacco: Never  Vaping Use   Vaping Use: Never used  Substance and Sexual Activity   Alcohol use: No    Alcohol/week: 0.0 standard drinks of alcohol   Drug use: No   Sexual activity: Yes    Partners: Male    Birth control/protection: None  Other Topics Concern    Not on file  Social History Narrative   Not on file   Social Determinants of Health   Financial Resource Strain: Medium Risk (06/17/2022)   Overall Financial Resource Strain (CARDIA)    Difficulty of Paying Living Expenses: Somewhat hard  Food Insecurity: Food Insecurity Present (06/17/2022)   Hunger Vital Sign    Worried About Running Out of Food in the Last Year: Sometimes true    Ran Out of Food in the Last Year: Patient declined  Transportation Needs: No Transportation Needs (06/17/2022)   PRAPARE - Administrator, Civil Service (Medical): No    Lack of Transportation (Non-Medical): No  Physical Activity: Inactive (06/17/2022)   Exercise Vital Sign    Days of Exercise per Week: 0 days    Minutes of Exercise per Session: 0 min  Stress: Stress Concern Present (06/17/2022)   [employer] of Occupational Health - Occupational Stress Questionnaire    Feeling of Stress : Very much  Social Connections: Moderately Isolated (06/17/2022)   Social Connection and Isolation Panel [NHANES]    Frequency of Communication with Friends and Family: Twice a week    Frequency of Social Gatherings with Friends and Family: Once a week    Attends Religious Services: Never    Database administrator or Organizations: Yes    Attends Banker Meetings: 1 to 4 times per year    Marital Status: Never married   Past Surgical History:  Procedure Laterality Date   CERVICAL POLYPECTOMY     CESAREAN SECTION     x 2   COLONOSCOPY WITH PROPOFOL N/A 11/14/2018   Procedure: COLONOSCOPY WITH PROPOFOL;  Surgeon: Pasty Spillers, MD;  Location: [facility];  Service: Endoscopy;  Laterality: N/A;   ESOPHAGOGASTRODUODENOSCOPY (EGD) WITH PROPOFOL N/A 11/14/2018   Procedure: ESOPHAGOGASTRODUODENOSCOPY (EGD) WITH PROPOFOL;  Surgeon: Pasty Spillers, MD;  Location: [facility];  Service: Endoscopy;  Laterality: N/A;  Latex allergy   GIVENS CAPSULE STUDY N/A 12/27/2018    Procedure: GIVENS CAPSULE STUDY;  Surgeon: Pasty Spillers, MD;  Location: [facility];  Service: Endoscopy;  Laterality: N/A;   Past Medical History:  Diagnosis Date   Abnormal thyroid blood test    Anemia    Anxiety    Asthma    Complication of anesthesia    itching after c sections   COVID-19 01/2020  Depression    Elevated serum glutamic pyruvic transaminase (SGPT) level    Graves disease    History of abnormal cervical Pap smear    History of cervical polypectomy    Hives 09/02/2015   Hypertension    Hypothyroidism    IFG (impaired fasting glucose)    Incidental lung nodule, > 3mm and < 8mm 03/31/2017   4 mm RLL lung nodule on chest CT Mar 31, 2017   Low serum vitamin D    Obesity    PONV (postoperative nausea and vomiting)    after c section had vomit   Pregnancy induced hypertension    with 1st pregnancy, normal pressure with 2nd   Reflux    Thyroid disease    Vitamin B12 deficiency    Vitamin D deficiency disease    Wears dentures    full upper   BP 139/88   Pulse 75   Ht 5\' 3"  (1.6 m)   Wt 236 lb (107 kg)   SpO2 100%   BMI 41.81 kg/m   Opioid Risk Score:   Fall Risk Score:  `1  [facility]     06/17/2022    9:27 AM 05/25/2022    9:13 AM 02/03/2022    1:18 PM 11/17/2021    1:08 PM 09/09/2021   11:32 AM 06/17/2021    3:00 PM 06/10/2021    4:10 PM  Depression screen PHQ 2/9  Decreased Interest 2 2 3 1  1    Down, Depressed, Hopeless 1 1 3 2  1    PHQ - 2 Score 3 3 6 3  2    Altered sleeping 3 2  0     Tired, decreased energy 3 2  2      Change in appetite 0 0  0     Feeling bad or failure about yourself  0 0  1     Trouble concentrating 0 0  0     Moving slowly or fidgety/restless 0 0  0     Suicidal thoughts 0 0  0     PHQ-9 Score 9 7  6      Difficult doing work/chores  Somewhat difficult          Information is confidential and restricted. Go to Review Flowsheets to unlock data.       Review of Systems  Constitutional:  Negative.   HENT: Negative.    Eyes: Negative.   Respiratory: Negative.    Cardiovascular: Negative.   Gastrointestinal: Negative.   Endocrine: Negative.   Genitourinary: Negative.   Musculoskeletal: Negative.        Pain from thighs to lower legs in both legs  Skin: Negative.   Allergic/Immunologic: Negative.   Neurological: Negative.   Hematological: Negative.   Psychiatric/Behavioral:  Positive for dysphoric mood. Negative for confusion. The patient is not nervous/anxious.   All other systems reviewed and are negative.      Objective:   Physical Exam Gen: no distress, normal appearing HEENT: oral mucosa pink and moist, NCAT Cardio: Reg rate Chest: normal effort, normal rate of breathing Abd: soft, non-distended Ext: no edema Psych: pleasant, normal affect Skin: intact Neuro: Alert and oriented x3. Cognitive impairments, slow thought processing.      Assessment & Plan:  Mrs. Lorella NimrodHarvey is a 42 year old woman who presents to establish care for bilateral lower extremity pain, neuropathy, and long COVID syndrome  1) Bilateral lower extremity neuropathy s/p LONG COVID syndrome -prescribed metformin 500mg  daily. Cr  reviewed and is 0.9 -Discussed Qutenza as an option for neuropathic pain control. Discussed that this is a capsaicin patch, stronger than capsaicin cream. Discussed that it is currently approved for diabetic peripheral neuropathy and post-herpetic neuralgia, but that it has also shown benefit in treating other forms of neuropathy. Provided patient with link to site to learn more about the patch: https://www.clark.biz/. Discussed that the patch would be placed in office and benefits usually last 3 months. Discussed that unintended exposure to capsaicin can cause severe irritation of eyes, mucous membranes, respiratory tract, and skin, but that Qutenza is a local treatment and does not have the systemic side effects of other nerve medications. Discussed that there may be  pain, itching, erythema, and decreased sensory function associated with the application of Qutenza. Side effects usually subside within 1 week. A cold pack of analgesic medications can help with these side effects. Blood pressure can also be increased due to pain associated with administration of the patch.  -Discussed current symptoms of pain and history of pain.  -discussed her disability -discussed that she was unable to get cognitive testing -discussed her response to ibuprofen and tylenol -Discussed benefits of exercise in reducing pain. -Continue gabapentin to 300mg  TID PRN. Discussed that this helped.  -completing disability paperwork for her -increase topamax to 50mg  HS -prescribed low dose naltrexone in the past but she currently does not have insurance.  -Provided with a pain relief journal and discussed that it contains foods and lifestyle tips to naturally help to improve pain. Discussed that these lifestyle strategies are also very good for health unlike some medications which can have negative side effects. Discussed that the act of keeping a journal can be therapeutic and helpful to realize patterns what helps to trigger and alleviate pain.   -Discussed current symptoms of pain and history of pain.  -Discussed benefits of exercise in reducing pain. -Discussed following foods that may reduce pain: 1) Ginger (especially studied for arthritis)- reduce leukotriene production to decrease inflammation 2) Blueberries- high in phytonutrients that decrease inflammation 3) Salmon- marine omega-3s reduce joint swelling and pain 4) Pumpkin seeds- reduce inflammation 5) dark chocolate- reduces inflammation 6) turmeric- reduces inflammation 7) tart cherries - reduce pain and stiffness 8) extra virgin olive oil - its compound olecanthal helps to block prostaglandins  9) chili peppers- can be eaten or applied topically via capsaicin 10) mint- helpful for headache, muscle aches, joint pain, and  itching 11) garlic- reduces inflammation  Link to further information on diet for chronic pain: http://www.bray.com/    2) Expressive aphasia -very embarrassing to her -continue SLP HEP -continue to follow with neuropsych once she has Medicaid to pay for their appointments -discussed her conversation with Dr. Kieth Brightly which was very helpful for her.   3) Impaired memory -discussed how difficult it is for her as other people cannot see this -discussed that her family is supportive.   4) Insomnia: -Try to go outside near sunrise -Get exercise during the day.  -Turn off all devices an hour before bedtime.  -Teas that can benefit: chamomile, valerian root, Brahmi (Bacopa) -Can consider over the counter melatonin, magnesium, and/or L-theanine. Melatonin is an anti-oxidant with multiple health benefits. Magnesium is involved in greater than 300 enzymatic reactions in the body and most of [place] are deficient as our soil is often depleted. There are 7 different types of magnesium- Bioptemizer's is a supplement with all 7 types, and each has unique benefits. Magnesium can also help with constipation and anxiety.  -  Pistachios naturally increase the production of melatonin -Cozy Earth bamboo bed sheets are free from toxic chemicals.  -Tart cherry juice or a tart cherry supplement can improve sleep and soreness post-workout  5) Numbness and tingling in hands -discussed that it is hard to bend them when she sleeps -prescribed metformin 500mg  daily -B6 supplement recommended   40 minutes spent in discussion of her neuropathy, thyroid dysfunction, insomnia, increasing topamax to 50mg  HS, that this medication helps a little bit, that she was found to have very low vitamin D levels, discussed the numbness and tingling in her hands, recommended trying metformin 500mg  daily, discussed using carpal tunnel braces for her hands, discussed  that she has started therapy, discussed that people don't understand when she says she is tired, discussed B6 supplementation for carpal tunnel syndrome, discussing that she still didn't get disability

## 2022-08-17 NOTE — Therapy (Signed)
OUTPATIENT PHYSICAL THERAPY NEURO EVALUATION   Patient Name: Catherine Berg MRN: [id number] DOB:1980/05/16, 42 y.o., female Today's Date: 08/18/2022   PCP: [name], MD  REFERRING PROVIDER: Horton Chin, MD  END OF SESSION:  PT End of Session - 08/18/22 1359     Visit Number 1    Number of Visits 13    Date for PT Re-Evaluation 09/29/22    Authorization Type [facility] Medicaid    Authorization Time Period auth submitted 08/18/22    Authorization - Visit Number 1    Authorization - Number of Visits 27   combined   PT Start Time 1317    PT Stop Time 1356    PT Time Calculation (min) 39 min    Equipment Utilized During Treatment Gait belt    Activity Tolerance Patient tolerated treatment well    Behavior During Therapy WFL for tasks assessed/performed             Past Medical History:  Diagnosis Date   Abnormal thyroid blood test    Anemia    Anxiety    Asthma    Complication of anesthesia    itching after c sections   COVID-19 01/2020   Depression    Elevated serum glutamic pyruvic transaminase (SGPT) level    Graves disease    History of abnormal cervical Pap smear    History of cervical polypectomy    Hives 09/02/2015   Hypertension    Hypothyroidism    IFG (impaired fasting glucose)    Incidental lung nodule, > 20mm and < 80mm 03/31/2017   4 mm RLL lung nodule on chest CT Mar 31, 2017   Low serum vitamin D    Obesity    PONV (postoperative nausea and vomiting)    after c section had vomit   Pregnancy induced hypertension    with 1st pregnancy, normal pressure with 2nd   Reflux    Thyroid disease    Vitamin B12 deficiency    Vitamin D deficiency disease    Wears dentures    full upper   Past Surgical History:  Procedure Laterality Date   CERVICAL POLYPECTOMY     CESAREAN SECTION     x 2   COLONOSCOPY WITH PROPOFOL N/A 11/14/2018   Procedure: COLONOSCOPY WITH PROPOFOL;  Surgeon: Pasty Spillers, MD;  Location: [facility];  Service: Endoscopy;  Laterality: N/A;   ESOPHAGOGASTRODUODENOSCOPY (EGD) WITH PROPOFOL N/A 11/14/2018   Procedure: ESOPHAGOGASTRODUODENOSCOPY (EGD) WITH PROPOFOL;  Surgeon: Pasty Spillers, MD;  Location: [facility];  Service: Endoscopy;  Laterality: N/A;  Latex allergy   GIVENS CAPSULE STUDY N/A 12/27/2018   Procedure: GIVENS CAPSULE STUDY;  Surgeon: Pasty Spillers, MD;  Location: [facility];  Service: Endoscopy;  Laterality: N/A;   Patient Active Problem List   Diagnosis Date Noted   MDD (major depressive disorder), recurrent episode, mild 11/17/2021   Neuropathy involving both lower extremities 11/17/2021   Perennial allergic rhinitis with seasonal variation 11/17/2021   Chronic pain of both lower extremities 09/24/2020   Sleeps in sitting position due to orthopnea 07/02/2020   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   COVID-19 long hauler 07/02/2020   Vitamin D deficiency 04/22/2020   COVID-19 long hauler manifesting chronic fatigue 04/22/2020   History of COVID-19 02/28/2020   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   Memory loss 02/28/2020   Physical deconditioning 02/28/2020   Dyspnea on exertion 02/28/2020   Neck pain, bilateral 02/28/2020   Muscle spasms of  neck 02/28/2020   Arthralgia of hand 01/24/2019   CRP elevated 01/24/2019   ESR raised 01/24/2019   Class 3 severe obesity due to excess calories without serious comorbidity in adult 01/24/2019   Palpitations 09/16/2018   Coronary artery calcification 09/16/2018   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   Bilateral leg edema 04/20/2017   Incidental lung nodule, > 3mm and < 8mm 03/31/2017   Hives 09/02/2015   Morbid obesity 08/15/2015   Anxiety 07/09/2015   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 06/28/2015   Thyroid nodule 02/01/2015   Vitamin D deficiency disease    Vitamin B12 deficiency    History of abnormal cervical Pap smear    Allergy-induced asthma, mild intermittent, uncomplicated 10/09/2013     ONSET DATE: 2021  REFERRING DIAG: G60.9 (ICD-10-CM) - Idiopathic peripheral neuropathy  THERAPY DIAG:  Muscle weakness (generalized)  Unsteadiness on feet  Other symptoms and signs involving the nervous system  Rationale for Evaluation and Treatment: Rehabilitation  SUBJECTIVE:                                                                                                                                                                                             SUBJECTIVE STATEMENT: Patient reports that she had COVID in 2021 and has had neuropathy since then. Reports also having pain in LEs, fatigue, memory loss (has seen ST), anxiety/depression since then. Also struggling with generalized pain since then as well. Reports neuropathy B feet up to the knees. Notices changes in balance- fell off the porch several months ago. Today is not a good day.  Pt accompanied by: self  PERTINENT HISTORY: Anemia, anxiety, asthma, depression, HTN, long COVID  PAIN:  Are you having pain? Yes: NPRS scale: 9/10 Pain location: B Les  Pain description: sharp, burning, just there Aggravating factors: night Relieving factors: meds  PRECAUTIONS: Fall  WEIGHT BEARING RESTRICTIONS: No  FALLS: Has patient fallen in last 6 months? No  LIVING ENVIRONMENT: Lives with:  kids Lives in: House/apartment Stairs:  3 steps to enter without handrail and 6 for the back door with handrail; 1 story home  Has following equipment at home: None  PLOF: Independent;  was working full time and walking ~4 miles a day prior to 2021; getting long term disability since 2021  PATIENT GOALS: would like to be pain free and get back to my routine   OBJECTIVE:   DIAGNOSTIC FINDINGS: none recent  COGNITION: Overall cognitive status: Within functional limits for tasks assessed   SENSATION: WFL to light touch   COORDINATION: Alternating pronation/supination: slightly decreased pace Alternating toe tap:  difficulty coordinating L>R LEs Finger to nose:  intact B   POSTURE: No Significant postural limitations  LOWER EXTREMITY ROM:     Active  Right Eval Left Eval  Hip flexion    Hip extension    Hip abduction    Hip adduction    Hip internal rotation    Hip external rotation    Knee flexion    Knee extension    Ankle dorsiflexion 0 -10  Ankle plantarflexion    Ankle inversion    Ankle eversion     (Blank rows = not tested)  LOWER EXTREMITY MMT:    MMT (in sitting) Right Eval Left Eval  Hip flexion 4- 4-  Hip extension    Hip abduction 4- 4  Hip adduction 4- 4-  Hip internal rotation    Hip external rotation    Knee flexion 4- 4-  Knee extension 4 4-  Ankle dorsiflexion 4 4+  Ankle plantarflexion 4- 4-  Ankle inversion    Ankle eversion    (Blank rows = not tested)   GAIT: Gait pattern: slow gait pattern with limited B foot clearance, occasionally scuffing feet Assistive device utilized: None Level of assistance: Modified independence   FUNCTIONAL TESTS:   [facility] PT Assessment - 08/18/22 0001       Standardized Balance Assessment   Standardized Balance Assessment Five Times Sit to Stand;Dynamic Gait Index;Timed Up and Go Test    Five times sit to stand comments  15.91 sec   last rep lowered down using hands on armrests     Dynamic Gait Index   Level Surface Normal    Change in Gait Speed Moderate Impairment    Gait with Horizontal Head Turns Mild Impairment    Gait with Vertical Head Turns Mild Impairment    Gait and Pivot Turn Mild Impairment    Step Over Obstacle Mild Impairment    Step Around Obstacles Normal    Steps Normal    Total Score 18      Timed Up and Go Test   Normal TUG (seconds) 12.93   without AD              TODAY'S TREATMENT:                                                                                                                              DATE: 08/18/22    PATIENT EDUCATION: Education details: prognosis, POC,  HEP- to be performed at counter for safety; discussion on possibly transferring to [facility] d/t closer to home Person educated: Patient Education method: Explanation, Demonstration, Tactile cues, Verbal cues, and Handouts Education comprehension: verbalized understanding and returned demonstration  HOME EXERCISE PROGRAM: Access Code: [email or web address] URL: [email or web address] Date: 08/18/2022 Prepared by: [facility] - Outpatient  Rehab - [facility]  Exercises - Sit to Stand with Arms Crossed  - 1 x daily - 5 x weekly - 2 sets - 10 reps - Gastroc Stretch on Wall  - 1 x daily -  5 x weekly - 2 sets - 30 sec hold - Standing Hip Abduction with Counter Support  - 1 x daily - 5 x weekly - 2 sets - 10 reps - Romberg Stance  - 1 x daily - 5 x weekly - 2 sets - 30 sec hold - Romberg Stance with Eyes Closed  - 1 x daily - 5 x weekly - 2 sets - 30 sec hold  GOALS: Goals reviewed with patient? Yes  SHORT TERM GOALS: Target date: 09/01/2022  Patient to be independent with initial HEP. Baseline: HEP initiated Goal status: INITIAL    LONG TERM GOALS: Target date: 09/29/2022  Patient to be independent with advanced HEP. Baseline: Not yet initiated  Goal status: INITIAL  Patient to demonstrate B LE strength >/=4/5.  Baseline: See above Goal status: INITIAL  Patient to demonstrate at least 5 degrees of L ankle dorsiflexion AROM in order to improve safety with gait. Baseline: L -10 deg Goal status: INITIAL  Patient to demonstrate alternating reciprocal pattern when ascending and descending stairs with good stability without handrail.    Baseline: limited eccentric control descending Goal status: INITIAL  Patient to score at least 20/24 on DGI in order to decrease risk of falls.  Baseline: 18 Goal status: INITIAL  Patient to demonstrate 5xSTS test in <13 sec in order to improve muscle power with transfers. Baseline: 15.91 sec without Ues  Goal status: INITIAL  Patient to  report plans to start home or community fitness regimen with modifications as needed.  Baseline: no plans Goal status: INITIAL   ASSESSMENT:  CLINICAL IMPRESSION:  Patient is a 42 y/o F presenting to [facility] with report of hx of long COVID resulting in LE peripheral neuropathy since 2021. Patient has also been struggling with chronic pain and recalls a fall in the past several months. Patient today presenting with limited L>R LE coordination, limited L>R ankle AROM, B LE weakness, gait deviations, and imbalance. Patient was educated on gentle LE strengthening HEP and reported understanding. Prior to 2021 patient was active and working. Would benefit from skilled PT services 1-2 x/week for 6 weeks to address aforementioned impairments in order to optimize level of function.    OBJECTIVE IMPAIRMENTS: Abnormal gait, decreased activity tolerance, decreased balance, decreased coordination, decreased endurance, difficulty walking, decreased ROM, decreased strength, impaired flexibility, and pain.   ACTIVITY LIMITATIONS: carrying, lifting, bending, standing, squatting, sleeping, stairs, transfers, bathing, toileting, dressing, hygiene/grooming, and caring for others  PARTICIPATION LIMITATIONS: meal prep, cleaning, laundry, driving, shopping, community activity, yard work, and church  PERSONAL FACTORS: Age, Past/current experiences, Time since onset of injury/illness/exacerbation, and 3+ comorbidities: Anemia, anxiety, asthma, depression, HTN  are also affecting patient's functional outcome.   REHAB POTENTIAL: Good  CLINICAL DECISION MAKING: Evolving/moderate complexity  EVALUATION COMPLEXITY: Moderate  PLAN:  PT FREQUENCY: 1-2x/week  PT DURATION: 6 weeks  PLANNED INTERVENTIONS: Therapeutic exercises, Therapeutic activity, Neuromuscular re-education, Balance training, Gait training, Patient/Family education, Self Care, Joint mobilization, Stair training, Vestibular training, Canalith  repositioning, Aquatic Therapy, Dry Needling, Electrical stimulation, Cryotherapy, Moist heat, Taping, Manual therapy, and Re-evaluation  PLAN FOR NEXT SESSION: review HEP; progress B LE strength, B ankle dorsiflexion ROM, test multisensory balance    Anette Guarneri, PT, DPT 08/18/22 3:58 PM  [facility] [address] [place], [place] [address] Phone # [phone] Fax # [phone]

## 2022-08-18 ENCOUNTER — Ambulatory Visit: Payer: Medicare Other | Attending: Physical Medicine and Rehabilitation | Admitting: Physical Therapy

## 2022-08-18 ENCOUNTER — Encounter: Payer: Self-pay | Admitting: Physical Medicine and Rehabilitation

## 2022-08-18 ENCOUNTER — Encounter (HOSPITAL_BASED_OUTPATIENT_CLINIC_OR_DEPARTMENT_OTHER): Payer: Medicare Other | Admitting: Physical Medicine and Rehabilitation

## 2022-08-18 ENCOUNTER — Other Ambulatory Visit: Payer: Self-pay

## 2022-08-18 ENCOUNTER — Encounter: Payer: Self-pay | Admitting: Physical Therapy

## 2022-08-18 DIAGNOSIS — M6281 Muscle weakness (generalized): Secondary | ICD-10-CM | POA: Insufficient documentation

## 2022-08-18 DIAGNOSIS — G8929 Other chronic pain: Secondary | ICD-10-CM

## 2022-08-18 DIAGNOSIS — G609 Hereditary and idiopathic neuropathy, unspecified: Secondary | ICD-10-CM | POA: Diagnosis not present

## 2022-08-18 DIAGNOSIS — R2681 Unsteadiness on feet: Secondary | ICD-10-CM | POA: Diagnosis present

## 2022-08-18 DIAGNOSIS — M79605 Pain in left leg: Secondary | ICD-10-CM | POA: Diagnosis not present

## 2022-08-18 DIAGNOSIS — M79604 Pain in right leg: Secondary | ICD-10-CM | POA: Diagnosis not present

## 2022-08-18 DIAGNOSIS — R29818 Other symptoms and signs involving the nervous system: Secondary | ICD-10-CM | POA: Diagnosis present

## 2022-08-19 ENCOUNTER — Ambulatory Visit: Payer: Medicaid Other | Admitting: Internal Medicine

## 2022-08-19 NOTE — Progress Notes (Unsigned)
Subjective:    Patient ID: [name], female    DOB: 1981-04-12, 42 y.o.   MRN: [id number]  HPI Mrs. 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 It makes her sleepy.  -she has tried a couple of things but nothing has helped -pain feels burning  -extends from her thighs to ankle -she tries to rest her legs so they do not hurt as badly at night.  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  -the lack of control really bothers her -some days this really gets to her -she forgets to pay bills if she does not do it right away.  -she feels that not having insurance has put her in behind from getting better -she talked to her brother on the way up here.   2) confusion:  -it is embarrassing to her -she did speech language therapy, she has not been able to do any more because of lack of insurance -she holds back on her medicine when she notes it affecting her cognition -she feels this is one of her top challenges daily.  -she had been out of work for a long time before she came to see me -they put it in that she resigned her work so she lost her benefits.  -she saw Dr. Kieth Brightly.   3) Peripheral neuropathy -has been severe this past weekend -topamax was helping before but is no longer helping -she gets long term disability through her job, but SSI was denied- she is hoping they will change her mind. If she could get this she would get medicaid too. 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 Because she has assets (she has a home, she has a car, and her son has a car), they make it  difficult for her to get any services.  -her feet have itching sensation in the top really bad -today has been really hurting.  -feels separated up the legs  4) Bilateral ankle pain   Pain Inventory Average Pain 8 Pain Right Now 7 My pain is dull and aching  In the last 24 hours, has pain interfered with the following? General activity 1 Relation with others 3 Enjoyment of life 1 What TIME of day is your pain at its worst? varies Sleep (in general) Poor  Pain is worse with: inactivity Pain improves with: medication Relief from Meds: 2    Family History  Problem Relation Age of Onset   Heart attack Mother 24   Hypertension Mother    Asthma Mother    Cancer Mother        laryngeal   Diabetes Mother        pre diabetic   Heart disease Mother    Mental illness Mother    Alcohol abuse Mother    Drug abuse Mother    Depression Mother    Anxiety disorder Mother    Bipolar disorder Mother    Learning disabilities Brother    ADD / ADHD Brother    Asthma Son    Hyperlipidemia Brother  Alcohol abuse Father    Heart disease Maternal Grandmother        heart attack, pacemaker   Heart attack Maternal Grandmother    Hypertension Maternal Grandmother    Hypertension Maternal Grandfather    Heart disease Paternal Grandmother        couple of major open heart surgeries, leaking valves   Hypertension Paternal Grandmother    Hypertension Paternal Grandfather    Breast cancer Neg Hx    Social History   Socioeconomic History   Marital status: Single    Spouse name: Not on file   Number of children: 2   Years of education: 11   Highest education level: High school graduate  Occupational History   Not on file  Tobacco Use   Smoking status: Never   Smokeless tobacco: Never  Vaping Use   Vaping Use: Never used  Substance and Sexual Activity   Alcohol use: No    Alcohol/week: 0.0 standard drinks of alcohol   Drug use: No   Sexual activity: Yes    Partners: Male     Birth control/protection: None  Other Topics Concern   Not on file  Social History Narrative   Not on file   Social Determinants of Health   Financial Resource Strain: Medium Risk (06/17/2022)   Overall Financial Resource Strain (CARDIA)    Difficulty of Paying Living Expenses: Somewhat hard  Food Insecurity: Food Insecurity Present (06/17/2022)   Hunger Vital Sign    Worried About Running Out of Food in the Last Year: Sometimes true    Ran Out of Food in the Last Year: Patient declined  Transportation Needs: No Transportation Needs (06/17/2022)   PRAPARE - Administrator, Civil Service (Medical): No    Lack of Transportation (Non-Medical): No  Physical Activity: Inactive (06/17/2022)   Exercise Vital Sign    Days of Exercise per Week: 0 days    Minutes of Exercise per Session: 0 min  Stress: Stress Concern Present (06/17/2022)   [employer] of Occupational Health - Occupational Stress Questionnaire    Feeling of Stress : Very much  Social Connections: Moderately Isolated (06/17/2022)   Social Connection and Isolation Panel [NHANES]    Frequency of Communication with Friends and Family: Twice a week    Frequency of Social Gatherings with Friends and Family: Once a week    Attends Religious Services: Never    Database administrator or Organizations: Yes    Attends Banker Meetings: 1 to 4 times per year    Marital Status: Never married   Past Surgical History:  Procedure Laterality Date   CERVICAL POLYPECTOMY     CESAREAN SECTION     x 2   COLONOSCOPY WITH PROPOFOL N/A 11/14/2018   Procedure: COLONOSCOPY WITH PROPOFOL;  Surgeon: Pasty Spillers, MD;  Location[facility];  Service: Endoscopy;  Laterality: N/A;   ESOPHAGOGASTRODUODENOSCOPY (EGD) WITH PROPOFOL N/A 11/14/2018   Procedure: ESOPHAGOGASTRODUODENOSCOPY (EGD) WITH PROPOFOL;  Surgeon: Pasty Spillers, MD;  Location: [facility];  Service: Endoscopy;  Laterality: N/A;   Latex allergy   GIVENS CAPSULE STUDY N/A 12/27/2018   Procedure: GIVENS CAPSULE STUDY;  Surgeon: Pasty Spillers, MD;  Location: [facility];  Service: Endoscopy;  Laterality: N/A;   Past Medical History:  Diagnosis Date   Abnormal thyroid blood test    Anemia    Anxiety    Asthma    Complication of anesthesia    itching after  c sections   COVID-19 01/2020   Depression    Elevated serum glutamic pyruvic transaminase (SGPT) level    Graves disease    History of abnormal cervical Pap smear    History of cervical polypectomy    Hives 09/02/2015   Hypertension    Hypothyroidism    IFG (impaired fasting glucose)    Incidental lung nodule, > 3mm and < 8mm 03/31/2017   4 mm RLL lung nodule on chest CT Mar 31, 2017   Low serum vitamin D    Obesity    PONV (postoperative nausea and vomiting)    after c section had vomit   Pregnancy induced hypertension    with 1st pregnancy, normal pressure with 2nd   Reflux    Thyroid disease    Vitamin B12 deficiency    Vitamin D deficiency disease    Wears dentures    full upper   There were no vitals taken for this visit.  Opioid Risk Score:   Fall Risk Score:  `1  Depression screen [facility] 2/9     06/17/2022    9:27 AM 05/25/2022    9:13 AM 02/03/2022    1:18 PM 11/17/2021    1:08 PM 09/09/2021   11:32 AM 06/17/2021    3:00 PM 06/10/2021    4:10 PM  Depression screen PHQ 2/9  Decreased Interest Down, Depressed, Hopeless PHQ - 2 Score Altered sleeping 3 2  0     Tired, decreased energy Change in appetite 0 0  0     Feeling bad or failure about yourself  0 0  1     Trouble concentrating 0 0  0     Moving slowly or fidgety/restless 0 0  0     Suicidal thoughts 0 0  0     PHQ-9 Score Difficult doing work/chores  Somewhat difficult          Information is confidential and restricted. Go to Review Flowsheets to unlock data.       Review of Systems  Constitutional:  Negative.   HENT: Negative.    Eyes: Negative.   Respiratory: Negative.    Cardiovascular: Negative.   Gastrointestinal: Negative.   Endocrine: Negative.   Genitourinary: Negative.   Musculoskeletal: Negative.        Pain from thighs to lower legs in both legs  Skin: Negative.   Allergic/Immunologic: Negative.   Neurological: Negative.   Hematological: Negative.   Psychiatric/Behavioral:  Positive for dysphoric mood. Negative for confusion. The patient is not nervous/anxious.   All other systems reviewed and are negative.      Objective:   Physical Exam Not performed     Assessment & Plan:  Mrs. Turnipseed is a 42 year old woman who presents to establish care for bilateral lower extremity pain, neuropathy, and long COVID syndrome  1) Bilateral lower extremity neuropathy s/p LONG COVID syndrome -prescribed metformin  daily. Cr reviewed and is 0.9 -Discussed Qutenza as an option for neuropathic pain control. Discussed that this is a capsaicin patch, stronger than capsaicin cream. Discussed that it is currently approved for diabetic peripheral neuropathy and post-herpetic neuralgia, but that it has also shown benefit in treating other forms of neuropathy. Provided patient with link to site to  learn more about the patch: https://www.clark.biz/. Discussed that the patch would be placed in office and benefits usually last 3 months. Discussed that unintended exposure to capsaicin can cause severe irritation of eyes, mucous membranes, respiratory tract, and skin, but that Qutenza is a local treatment and does not have the systemic side effects of other nerve medications. Discussed that there may be pain, itching, erythema, and decreased sensory function associated with the application of Qutenza. Side effects usually subside within 1 week. A cold pack of analgesic medications can help with these side effects. Blood pressure can also be increased due to pain associated with administration of  the patch.  -Discussed current symptoms of pain and history of pain.  -discussed her disability -discussed that she was unable to get cognitive testing -discussed her response to ibuprofen and tylenol -Discussed benefits of exercise in reducing pain. -Continue gabapentin to 300mg  TID PRN. Discussed that this helped.  -completing disability paperwork for her -increase topamax to 50mg  HS -prescribed low dose naltrexone in the past but she currently does not have insurance.  -Provided with a pain relief journal and discussed that it contains foods and lifestyle tips to naturally help to improve pain. Discussed that these lifestyle strategies are also very good for health unlike some medications which can have negative side effects. Discussed that the act of keeping a journal can be therapeutic and helpful to realize patterns what helps to trigger and alleviate pain.   -Discussed current symptoms of pain and history of pain.  -Discussed benefits of exercise in reducing pain. -Discussed following foods that may reduce pain: 1) Ginger (especially studied for arthritis)- reduce leukotriene production to decrease inflammation 2) Blueberries- high in phytonutrients that decrease inflammation 3) Salmon- marine omega-3s reduce joint swelling and pain 4) Pumpkin seeds- reduce inflammation 5) dark chocolate- reduces inflammation 6) turmeric- reduces inflammation 7) tart cherries - reduce pain and stiffness 8) extra virgin olive oil - its compound olecanthal helps to block prostaglandins  9) chili peppers- can be eaten or applied topically via capsaicin 10) mint- helpful for headache, muscle aches, joint pain, and itching 11) garlic- reduces inflammation  Link to further information on diet for chronic pain: http://www.bray.com/    2) Expressive aphasia -very embarrassing to her -continue SLP HEP -continue to follow with  neuropsych once she has Medicaid to pay for their appointments -discussed her conversation with Dr. Kieth Brightly which was very helpful for her.   3) Impaired memory -discussed how difficult it is for her as other people cannot see this -discussed that her family is supportive.   4) Insomnia: -Try to go outside near sunrise -Get exercise during the day.  -Turn off all devices an hour before bedtime.  -Teas that can benefit: chamomile, valerian root, Brahmi (Bacopa) -Can consider over the counter melatonin, magnesium, and/or L-theanine. Melatonin is an anti-oxidant with multiple health benefits. Magnesium is involved in greater than 300 enzymatic reactions in the body and most of [place] are deficient as our soil is often depleted. There are 7 different types of magnesium- Bioptemizer's is a supplement with all 7 types, and each has unique benefits. Magnesium can also help with constipation and anxiety.  -Pistachios naturally increase the production of melatonin -Cozy Earth bamboo bed sheets are free from toxic chemicals.  -Tart cherry juice or a tart cherry supplement can improve sleep and soreness post-workout  5) Numbness and tingling in hands -discussed that it is hard to bend them when she sleeps -prescribed metformin 500mg  daily -B6 supplement  recommended   5 minutes spent discussing increasing topamax dose at night to help with pain and sleep, calling me if this is not effective

## 2022-08-21 DIAGNOSIS — S76311A Strain of muscle, fascia and tendon of the posterior muscle group at thigh level, right thigh, initial encounter: Secondary | ICD-10-CM | POA: Diagnosis not present
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
## 2022-08-25 ENCOUNTER — Other Ambulatory Visit: Payer: Self-pay | Admitting: Family

## 2022-08-25 DIAGNOSIS — I10 Essential (primary) hypertension: Secondary | ICD-10-CM

## 2022-08-25 NOTE — Progress Notes (Unsigned)
Name: Catherine Berg   MRN: [id number]    DOB: May 21, 1980   Date:08/26/2022       Progress Note  Subjective  Chief Complaint  Follow Up  HPI  HTN:  taking medication daily, no headache, chest pain or palpitation, bp is now at goal. 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 She had  depression prior to COVID but got worse after COVID and was having PTSD and worsening of her depression Since had a gap in insurance but now has Medicaid. She has  seen by  multiple doctors: Pulmonologist, Rheumatologist, Psychiatrist, Neurologist, PT, OT , she is under the care of Dr. Carlis Abbott and he recommended going back to PT and she will also have some cognitive testing done soon. She sees her Psychiatrist Dr. Vanetta Shawl . She is compliant   Post-ablation hypothyroidism: recently seen by Dr. Gershon Crane and is now on 88 mcg of levothyroxine daily, no constipation, she has mild dry skin    Asthma Moderate persistent  under the care of Dr. Belia Heman, she is on [facility] and montelukast and is doing better.    Perennial AR: she states symptoms are well controlled, not using nasal spray, but taking singulair and prn anti-histamines    Vitamin D deficiency: she would like to resume Vitamin D rx   B12 deficiency: reminded her to get SL B12 since last level was low and she has fatigue plus neuropathy , she would like to have an injection today    Morbid Obesity: weight is stable now   BMI is above 40, discussed life style modification, she has insurance but Medicaid does not pay for weight loss medication   Hydradenitis suppurativa: usually only under both arms but worse on left axilla  she stopped using deodorant   Patient Active Problem List   Diagnosis Date Noted    MDD (major depressive disorder), recurrent episode, mild 11/17/2021   Neuropathy involving both lower extremities 11/17/2021   Perennial allergic rhinitis with seasonal variation 11/17/2021   Chronic pain of both lower extremities 09/24/2020   Sleeps in sitting position due to orthopnea 07/02/2020   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   COVID-19 long hauler 07/02/2020   Vitamin D deficiency 04/22/2020   COVID-19 long hauler manifesting chronic fatigue 04/22/2020   History of COVID-19 02/28/2020   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   Memory loss 02/28/2020   Physical deconditioning 02/28/2020   Dyspnea on exertion 02/28/2020   Neck pain, bilateral 02/28/2020   Muscle spasms of neck 02/28/2020   Arthralgia of hand 01/24/2019   CRP elevated 01/24/2019   ESR raised 01/24/2019   Class 3 severe obesity due to excess calories without serious comorbidity in adult 01/24/2019   Palpitations 09/16/2018   Coronary artery calcification 09/16/2018   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   Bilateral leg edema 04/20/2017   Incidental lung nodule, > 3mm and < 8mm 03/31/2017   Hives 09/02/2015   Morbid obesity 08/15/2015   Anxiety 07/09/2015   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 06/28/2015   Thyroid nodule 02/01/2015   Vitamin D deficiency disease    Vitamin B12 deficiency    History of abnormal cervical Pap smear    Allergy-induced asthma, mild intermittent, uncomplicated 10/09/2013    Past Surgical History:  Procedure Laterality Date   CERVICAL POLYPECTOMY  CESAREAN SECTION     x 2   COLONOSCOPY WITH PROPOFOL N/A 11/14/2018   Procedure: COLONOSCOPY WITH PROPOFOL;  Surgeon: Pasty Spillers, MD;  Location: [facility];  Service: Endoscopy;  Laterality: N/A;   ESOPHAGOGASTRODUODENOSCOPY (EGD) WITH PROPOFOL N/A 11/14/2018   Procedure: ESOPHAGOGASTRODUODENOSCOPY (EGD) WITH PROPOFOL;  Surgeon: Pasty Spillers, MD;  Location: [facility];  Service: Endoscopy;  Laterality:  N/A;  Latex allergy   GIVENS CAPSULE STUDY N/A 12/27/2018   Procedure: GIVENS CAPSULE STUDY;  Surgeon: Pasty Spillers, MD;  Location: [facility];  Service: Endoscopy;  Laterality: N/A;    Family History  Problem Relation Age of Onset   Heart attack Mother 42   Hypertension Mother    Asthma Mother    Cancer Mother        laryngeal   Diabetes Mother        pre diabetic   Heart disease Mother    Mental illness Mother    Alcohol abuse Mother    Drug abuse Mother    Depression Mother    Anxiety disorder Mother    Bipolar disorder Mother    Learning disabilities Brother    ADD / ADHD Brother    Asthma Son    Hyperlipidemia Brother    Alcohol abuse Father    Heart disease Maternal Grandmother        heart attack, pacemaker   Heart attack Maternal Grandmother    Hypertension Maternal Grandmother    Hypertension Maternal Grandfather    Heart disease Paternal Grandmother        couple of major open heart surgeries, leaking valves   Hypertension Paternal Grandmother    Hypertension Paternal Grandfather    Breast cancer Neg Hx     Social History   Tobacco Use   Smoking status: Never   Smokeless tobacco: Never  Substance Use Topics   Alcohol use: No    Alcohol/week: 0.0 standard drinks of alcohol     Current Outpatient Medications:    amLODipine (NORVASC) 5 MG tablet, Take 1 tablet (5 mg total) by mouth daily., Disp: 90 tablet, Rfl: 0   Azelastine HCl 137 MCG/SPRAY SOLN, SPRAY TWO SPRAYS IN EACH NOSTRIL TWICE DAILY., Disp: 30 mL, Rfl: 1   buPROPion (WELLBUTRIN XL) 150 MG 24 hr tablet, 150 mg daily for one day, then 300 mg daily for one day, then 450 mg daily, Disp: 90 tablet, Rfl: 1   Cyanocobalamin (B-12) 1000 MCG SUBL, Place 1 tablet under the tongue daily., Disp: 90 tablet, Rfl: 1   DULoxetine (CYMBALTA) 30 MG capsule, 30 mg daily for one day, then 60 mg daily for one day, then 90 mg daily, Disp: 90 capsule, Rfl: 1   etonogestrel (NEXPLANON) 68 MG IMPL implant,  Inject 68 mg into the skin once., Disp: , Rfl:    levothyroxine (SYNTHROID) 75 MCG tablet, Take 88 mcg by mouth daily before breakfast. Take one tablet once daily, Disp: , Rfl:    metFORMIN (GLUCOPHAGE) 500 MG tablet, Take 1 tablet (500 mg total) by mouth daily with breakfast., Disp: 90 tablet, Rfl: 3   mometasone-formoterol (DULERA) 200-5 MCG/ACT AERO, Inhale 2 puffs into the lungs in the morning and at bedtime., Disp: 1 each, Rfl: 11   montelukast (SINGULAIR) 10 MG tablet, Take 1 tablet (10 mg total) by mouth at bedtime., Disp: 90 tablet, Rfl: 1   topiramate (TOPAMAX) 50 MG tablet, Take 1 tablet (50 mg total) by mouth at bedtime., Disp: 90 tablet, Rfl:  3   VENTOLIN HFA 108 (90 Base) MCG/ACT inhaler, INHALE 2 PUFFS BY MOUTH EVERY 6 HOURS AS NEEDED FOR WHEEZING OR SHORTNESS OF BREATH, Disp: 18 g, Rfl: 1   Vitamin D, Ergocalciferol, (DRISDOL) 1.25 MG (50000 UNIT) CAPS capsule, Take 1 capsule (50,000 Units total) by mouth every 7 (seven) days., Disp: 12 capsule, Rfl: 1  Allergies  Allergen Reactions   Latex Hives    (Balloons, condoms, underwear elastic, gloves)   Shellfish Allergy Hives    I personally reviewed active problem list, medication list, allergies, family history, social history, health maintenance with the patient/caregiver today.   ROS  Constitutional: Negative for fever or weight change.  Respiratory: Negative for cough and shortness of breath.   Cardiovascular: Negative for chest pain or palpitations.  Gastrointestinal: Negative for abdominal pain, no bowel changes.  Musculoskeletal: Negative for gait problem or joint swelling.  Skin: Negative for rash.  Neurological: Negative for dizziness or headache.  No other specific complaints in a complete review of systems (except as listed in HPI above).   Objective  Vitals:   08/26/22 0800  BP: 126/82  Pulse: 92  Resp: 16  SpO2: 99%  Weight: 235 lb (106.6 kg)  Height:  (1.6 m)    Body mass index is 41.63  kg/m.  Physical Exam  Constitutional: Patient appears well-developed and well-nourished. Obese  No distress.  HEENT: head atraumatic, normocephalic, pupils equal and reactive to light, neck supple Cardiovascular: Normal rate, regular rhythm and normal heart sounds.  No murmur heard. No BLE edema. Pulmonary/Chest: Effort normal and breath sounds normal. No respiratory distress. Abdominal: Soft.  There is no tenderness. Skin: left axilla has some pustules  Psychiatric: Patient has a normal mood and affect. behavior is normal. Judgment and thought content normal.    PHQ2/9:    08/26/2022    8:00 AM 06/17/2022    9:27 AM 05/25/2022    9:13 AM 02/03/2022    1:18 PM 11/17/2021    1:08 PM  Depression screen PHQ 2/9  Decreased Interest Down, Depressed, Hopeless PHQ - 2 Score Altered sleeping 0  Tired, decreased energy Change in appetite 0 0 0  0  Feeling bad or failure about yourself  0 0 0  1  Trouble concentrating 2 0 0  0  Moving slowly or fidgety/restless 0 0 0  0  Suicidal thoughts 0 0 0  0  PHQ-9 Score Difficult doing work/chores   Somewhat difficult      phq 9 is positive   Fall Risk:    08/26/2022    8:00 AM 08/11/2022    1:19 PM 06/17/2022    9:27 AM 05/25/2022    9:12 AM 02/03/2022    1:18 PM  Fall Risk   Falls in the past year? 1 0 Number falls in past yr: 0  0 0 0  Injury with Fall? 0  0 0 0  Risk for fall due to : No Fall Risks  No Fall Risks History of fall(s)   Follow up Falls prevention discussed  Falls prevention discussed Falls prevention discussed;Education provided;Falls evaluation completed      Functional Status Survey: Is the patient deaf or have difficulty hearing?: No Does the patient have difficulty seeing, even when wearing glasses/contacts?: Yes Does  the patient have difficulty concentrating, remembering, or making decisions?: Yes Does the patient have difficulty walking or  climbing stairs?: No Does the patient have difficulty dressing or bathing?: No Does the patient have difficulty doing errands alone such as visiting a doctor's office or shopping?: No    Assessment & Plan  1. Adult hypothyroidism  On higher dose of levothyroxine, monitored by Dr. Gershon Crane   2. Primary hypertension  Doing well on current regiment  - amLODipine (NORVASC) 5 MG tablet; Take 1 tablet (5 mg total) by mouth daily.  Dispense: 90 tablet; Refill: 1  3. Vitamin B12 deficiency  - cyanocobalamin (VITAMIN B12) injection 1,000 mcg   4. Morbid obesity  Discussed with the patient the risk posed by an increased BMI. Discussed importance of portion control, calorie counting and at least 150 minutes of physical activity weekly. Avoid sweet beverages and drink more water. Eat at least 6 servings of fruit and vegetables daily    5. Moderate recurrent major depression  Managed by Dr. Vanetta Shawl, compliant with medications and visits  6. Neuropathy involving both lower extremities  Seeing PMR  7. Perennial allergic rhinitis with seasonal variation  A little worse this time of the year, she has nasal sprays at home  8. Vitamin D deficiency disease  - Vitamin D, Ergocalciferol, (DRISDOL) 1.25 MG (50000 UNIT) CAPS capsule; Take 1 capsule (50,000 Units total) by mouth every 7 (seven) days.  Dispense: 12 capsule; Refill: 1  9. Hidradenitis suppurativa  - Ambulatory referral to Dermatology  10. Moderate persistent asthma in adult without complication  Under the care of Dr. Belia Heman and states symptoms improved with medications

## 2022-08-26 ENCOUNTER — Encounter: Payer: Self-pay | Admitting: Psychiatry

## 2022-08-26 ENCOUNTER — Telehealth (INDEPENDENT_AMBULATORY_CARE_PROVIDER_SITE_OTHER): Payer: Medicaid Other | Admitting: Psychiatry

## 2022-08-26 ENCOUNTER — Encounter: Payer: Self-pay | Admitting: Family Medicine

## 2022-08-26 ENCOUNTER — Ambulatory Visit: Payer: Medicaid Other | Admitting: Family Medicine

## 2022-08-26 VITALS — BP 126/82 | HR 92 | Resp 16 | Ht 63.0 in | Wt 235.0 lb

## 2022-08-26 DIAGNOSIS — G5793 Unspecified mononeuropathy of bilateral lower limbs: Secondary | ICD-10-CM

## 2022-08-26 DIAGNOSIS — J3089 Other allergic rhinitis: Secondary | ICD-10-CM

## 2022-08-26 DIAGNOSIS — J4540 Moderate persistent asthma, uncomplicated: Secondary | ICD-10-CM

## 2022-08-26 DIAGNOSIS — E039 Hypothyroidism, unspecified: Secondary | ICD-10-CM

## 2022-08-26 DIAGNOSIS — F331 Major depressive disorder, recurrent, moderate: Secondary | ICD-10-CM

## 2022-08-26 DIAGNOSIS — J302 Other seasonal allergic rhinitis: Secondary | ICD-10-CM | POA: Diagnosis not present

## 2022-08-26 DIAGNOSIS — E559 Vitamin D deficiency, unspecified: Secondary | ICD-10-CM | POA: Diagnosis not present

## 2022-08-26 DIAGNOSIS — F4310 Post-traumatic stress disorder, unspecified: Secondary | ICD-10-CM

## 2022-08-26 DIAGNOSIS — E538 Deficiency of other specified B group vitamins: Secondary | ICD-10-CM

## 2022-08-26 DIAGNOSIS — I10 Essential (primary) hypertension: Secondary | ICD-10-CM | POA: Diagnosis not present

## 2022-08-26 DIAGNOSIS — L732 Hidradenitis suppurativa: Secondary | ICD-10-CM | POA: Diagnosis not present

## 2022-08-26 DIAGNOSIS — F411 Generalized anxiety disorder: Secondary | ICD-10-CM | POA: Diagnosis not present

## 2022-08-26 MED ORDER — DULOXETINE HCL 60 MG PO CPEP: 60.0000 mg | ORAL_CAPSULE | Freq: Every day | ORAL | 2 refills | Status: DC

## 2022-08-26 MED ORDER — DULOXETINE HCL 30 MG PO CPEP: 30.0000 mg | ORAL_CAPSULE | Freq: Every day | ORAL | 0 refills | Status: DC

## 2022-08-26 MED ORDER — VITAMIN D (ERGOCALCIFEROL) 1.25 MG (50000 UNIT) PO CAPS
50000.0000 [IU] | ORAL_CAPSULE | ORAL | 1 refills | Status: DC
Start: 2022-08-26 — End: 2022-12-14

## 2022-08-26 MED ORDER — CYANOCOBALAMIN 1000 MCG/ML IJ SOLN
1000.0000 ug | Freq: Once | INTRAMUSCULAR | Status: AC
Start: 2022-08-26 — End: 2022-08-26
  Administered 2022-08-26: 1000 ug via INTRAMUSCULAR

## 2022-08-26 MED ORDER — AMLODIPINE BESYLATE 5 MG PO TABS: 5.0000 mg | ORAL_TABLET | Freq: Every day | ORAL | 1 refills | Status: DC

## 2022-08-26 NOTE — Patient Instructions (Signed)
Start duloxetine 30 mg daily for one week, then 60 mg daily  Next appointment- 5/30 at 11 30

## 2022-08-31 ENCOUNTER — Ambulatory Visit: Payer: Medicare Other

## 2022-08-31 DIAGNOSIS — R2681 Unsteadiness on feet: Secondary | ICD-10-CM | POA: Diagnosis not present

## 2022-08-31 DIAGNOSIS — M6281 Muscle weakness (generalized): Secondary | ICD-10-CM | POA: Diagnosis not present

## 2022-08-31 DIAGNOSIS — G609 Hereditary and idiopathic neuropathy, unspecified: Secondary | ICD-10-CM | POA: Diagnosis not present

## 2022-08-31 DIAGNOSIS — R29818 Other symptoms and signs involving the nervous system: Secondary | ICD-10-CM

## 2022-08-31 NOTE — Therapy (Signed)
OUTPATIENT PHYSICAL THERAPY NEURO TREATMENT   Patient Name: Catherine Berg MRN: [id number] DOB:1980-09-11, 42 y.o., female Today's Date: 08/31/2022   PCP: [name], MD  REFERRING PROVIDER: Horton Chin, MD  END OF SESSION:  PT End of Session - 08/31/22 1404     Visit Number 2    Number of Visits 13    Date for PT Re-Evaluation 09/29/22    Authorization Type [facility] Medicaid    Authorization Time Period auth submitted 08/18/22    Authorization - Visit Number 2    Authorization - Number of Visits 27   combined   PT Start Time 1403    PT Stop Time 1445    PT Time Calculation (min) 42 min    Equipment Utilized During Treatment Gait belt    Activity Tolerance Patient tolerated treatment well    Behavior During Therapy WFL for tasks assessed/performed             Past Medical History:  Diagnosis Date   Abnormal thyroid blood test    Anemia    Anxiety    Asthma    Complication of anesthesia    itching after c sections   COVID-19 01/2020   Depression    Elevated serum glutamic pyruvic transaminase (SGPT) level    Graves disease    History of abnormal cervical Pap smear    History of cervical polypectomy    Hives 09/02/2015   Hypertension    Hypothyroidism    IFG (impaired fasting glucose)    Incidental lung nodule, > 3mm and < 8mm 03/31/2017   4 mm RLL lung nodule on chest CT Mar 31, 2017   Low serum vitamin D    Obesity    PONV (postoperative nausea and vomiting)    after c section had vomit   Pregnancy induced hypertension    with 1st pregnancy, normal pressure with 2nd   Reflux    Thyroid disease    Vitamin B12 deficiency    Vitamin D deficiency disease    Wears dentures    full upper   Past Surgical History:  Procedure Laterality Date   CERVICAL POLYPECTOMY     CESAREAN SECTION     x 2   COLONOSCOPY WITH PROPOFOL N/A 11/14/2018   Procedure: COLONOSCOPY WITH PROPOFOL;  Surgeon: Pasty Spillers, MD;  Location: [facility];   Service: Endoscopy;  Laterality: N/A;   ESOPHAGOGASTRODUODENOSCOPY (EGD) WITH PROPOFOL N/A 11/14/2018   Procedure: ESOPHAGOGASTRODUODENOSCOPY (EGD) WITH PROPOFOL;  Surgeon: Pasty Spillers, MD;  Location: [facility];  Service: Endoscopy;  Laterality: N/A;  Latex allergy   GIVENS CAPSULE STUDY N/A 12/27/2018   Procedure: GIVENS CAPSULE STUDY;  Surgeon: Pasty Spillers, MD;  Location: [facility];  Service: Endoscopy;  Laterality: N/A;   Patient Active Problem List   Diagnosis Date Noted   MDD (major depressive disorder), recurrent episode, mild (HCC) 11/17/2021   Neuropathy involving both lower extremities 11/17/2021   Perennial allergic rhinitis with seasonal variation 11/17/2021   Chronic pain of both lower extremities 09/24/2020   Sleeps in sitting position due to orthopnea 07/02/2020   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   COVID-19 long hauler 07/02/2020   Vitamin D deficiency 04/22/2020   COVID-19 long hauler manifesting chronic fatigue 04/22/2020   History of COVID-19 02/28/2020   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   Memory loss 02/28/2020   Physical deconditioning 02/28/2020   Dyspnea on exertion 02/28/2020   Neck pain, bilateral 02/28/2020   Muscle spasms  of neck 02/28/2020   Arthralgia of hand 01/24/2019   CRP elevated 01/24/2019   ESR raised 01/24/2019   Class 3 severe obesity due to excess calories without serious comorbidity in adult [facility]) 01/24/2019   Palpitations 09/16/2018   Coronary artery calcification 09/16/2018   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   Bilateral leg edema 04/20/2017   Incidental lung nodule, > 3mm and < 8mm 03/31/2017   Hives 09/02/2015   Morbid obesity (HCC) 08/15/2015   Anxiety 07/09/2015   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 06/28/2015   Thyroid nodule 02/01/2015   Vitamin D deficiency disease    Vitamin B12 deficiency    History of abnormal cervical Pap smear    Allergy-induced asthma, mild intermittent, uncomplicated  10/09/2013    ONSET DATE: 2021  REFERRING DIAG: G60.9 (ICD-10-CM) - Idiopathic peripheral neuropathy  THERAPY DIAG:  Muscle weakness (generalized)  Unsteadiness on feet  Other symptoms and signs involving the nervous system  Rationale for Evaluation and Treatment: Rehabilitation  SUBJECTIVE:                                                                                                                                                                                             SUBJECTIVE STATEMENT: Pulled right hamstring running after dog 1.5 weeks ago.  Went to ortho med for check up.    Pt accompanied by: self  PERTINENT HISTORY: Anemia, anxiety, asthma, depression, HTN, long COVID  PAIN:  Are you having pain? Yes: NPRS scale: 5-6/10 Pain location: right posterior thigh Pain description: sharp, burning, just there Aggravating factors: night Relieving factors: meds  PRECAUTIONS: Fall  WEIGHT BEARING RESTRICTIONS: No  FALLS: Has patient fallen in last 6 months? No  LIVING ENVIRONMENT: Lives with:  kids Lives in: House/apartment Stairs:  3 steps to enter without handrail and 6 for the back door with handrail; 1 story home  Has following equipment at home: None  PLOF: Independent;  was working full time and walking ~4 miles a day prior to 2021; getting long term disability since 2021  PATIENT GOALS: would like to be pain free and get back to my routine   OBJECTIVE:   TODAY'S TREATMENT: 08/31/22 Activity Comments  HEP review 100% recall  Single leg stance 3x 10 sec w/ finger tip support on counter  Seated hamstring curls 2x10 10#  Standing hamstring curls 2x10 No weight  Gastroc stretch 2x30 sec  Seated hamstring stretch  3x60 sec    PATIENT EDUCATION: Education details: prognosis, POC, HEP- to be performed at counter for safety; discussion on possibly transferring to [facility] d/t closer to home Person educated: Patient Education method: Explanation,  Demonstration, Tactile cues, Verbal cues, and Handouts Education comprehension: verbalized understanding and returned demonstration  HOME EXERCISE PROGRAM: Access Code: [email or web address] URL: [email or web address] Date: 08/18/2022 Prepared by: [facility] - Outpatient  Rehab - [facility]  Exercises - Sit to Stand with Arms Crossed  - 1 x daily - 5 x weekly - 2 sets - 10 reps - Gastroc Stretch on Wall  - 1 x daily - 5 x weekly - 2 sets - 30 sec hold - Standing Hip Abduction with Counter Support  - 1 x daily - 5 x weekly - 2 sets - 10 reps - Romberg Stance  - 1 x daily - 5 x weekly - 2 sets - 30 sec hold - Romberg Stance with Eyes Closed  - 1 x daily - 5 x weekly - 2 sets - 30 sec hold - Standing Single Leg Stance with Counter Support  - 1 x daily - 7 x weekly - 3 sets - 10 sec hold - Standing Knee Flexion AROM with Chair Support  - 1 x daily - 7 x weekly - 3 sets - 10 reps - Seated Table Hamstring Stretch  - 1 x daily - 7 x weekly - 3 sets - 60 sec (or 10 deep breaths) hold  DIAGNOSTIC FINDINGS: none recent  COGNITION: Overall cognitive status: Within functional limits for tasks assessed   SENSATION: WFL to light touch   COORDINATION: Alternating pronation/supination: slightly decreased pace Alternating toe tap: difficulty coordinating L>R LEs Finger to nose: intact B   POSTURE: No Significant postural limitations  LOWER EXTREMITY ROM:     Active  Right Eval Left Eval  Hip flexion    Hip extension    Hip abduction    Hip adduction    Hip internal rotation    Hip external rotation    Knee flexion    Knee extension    Ankle dorsiflexion 0 -10  Ankle plantarflexion    Ankle inversion    Ankle eversion     (Blank rows = not tested)  LOWER EXTREMITY MMT:    MMT (in sitting) Right Eval Left Eval  Hip flexion 4- 4-  Hip extension    Hip abduction 4- 4  Hip adduction 4- 4-  Hip internal rotation    Hip external rotation    Knee flexion 4- 4-  Knee  extension 4 4-  Ankle dorsiflexion 4 4+  Ankle plantarflexion 4- 4-  Ankle inversion    Ankle eversion    (Blank rows = not tested)   GAIT: Gait pattern: slow gait pattern with limited B foot clearance, occasionally scuffing feet Assistive device utilized: None Level of assistance: Modified independence   FUNCTIONAL TESTS:     GOALS: Goals reviewed with patient? Yes  SHORT TERM GOALS: Target date: 09/01/2022  Patient to be independent with initial HEP. Baseline: HEP initiated Goal status: MET    LONG TERM GOALS: Target date: 09/29/2022  Patient to be independent with advanced HEP. Baseline: Not yet initiated  Goal status: INITIAL  Patient to demonstrate B LE strength >/=4/5.  Baseline: See above Goal status: INITIAL  Patient to demonstrate at least 5 degrees of L ankle dorsiflexion AROM in order to improve safety with gait. Baseline: L -10 deg Goal status: INITIAL  Patient to demonstrate alternating reciprocal pattern when ascending and descending stairs with good stability without handrail.    Baseline: limited eccentric control descending Goal status: INITIAL  Patient to score at least 20/24 on DGI in order to decrease  risk of falls.  Baseline: 18 Goal status: INITIAL  Patient to demonstrate 5xSTS test in <13 sec in order to improve muscle power with transfers. Baseline: 15.91 sec without Ues  Goal status: INITIAL  Patient to report plans to start home or community fitness regimen with modifications as needed.  Baseline: no plans Goal status: INITIAL   ASSESSMENT:  CLINICAL IMPRESSION:  Pt returns to clinic and reports 1.5 weeks ago ran after her dog and strained right hamstring.  Pt notes overall improved pain symptoms with chief complaint of soreness in right posterior thigh.  Review of HEP demo 100% recall and pt notes good compliance.  Addition of hamstring-specific activities to improve RLE function including loaded movements in sitting and  standing followed by static stretch to area to reduce pain and improve function RLE.  Pt requests transfer to [facility] clinic which would be closer to home.    OBJECTIVE IMPAIRMENTS: Abnormal gait, decreased activity tolerance, decreased balance, decreased coordination, decreased endurance, difficulty walking, decreased ROM, decreased strength, impaired flexibility, and pain.   ACTIVITY LIMITATIONS: carrying, lifting, bending, standing, squatting, sleeping, stairs, transfers, bathing, toileting, dressing, hygiene/grooming, and caring for others  PARTICIPATION LIMITATIONS: meal prep, cleaning, laundry, driving, shopping, community activity, yard work, and church  PERSONAL FACTORS: Age, Past/current experiences, Time since onset of injury/illness/exacerbation, and 3+ comorbidities: Anemia, anxiety, asthma, depression, HTN  are also affecting patient's functional outcome.   REHAB POTENTIAL: Good  CLINICAL DECISION MAKING: Evolving/moderate complexity  EVALUATION COMPLEXITY: Moderate  PLAN:  PT FREQUENCY: 1-2x/week  PT DURATION: 6 weeks  PLANNED INTERVENTIONS: Therapeutic exercises, Therapeutic activity, Neuromuscular re-education, Balance training, Gait training, Patient/Family education, Self Care, Joint mobilization, Stair training, Vestibular training, Canalith repositioning, Aquatic Therapy, Dry Needling, Electrical stimulation, Cryotherapy, Moist heat, Taping, Manual therapy, and Re-evaluation  PLAN FOR NEXT SESSION: review HEP; progress B LE strength, B ankle dorsiflexion ROM, test multisensory balance    2:57 PM, 08/31/22 M. Shary Decamp, PT, DPT Physical Therapist- [place] Office Number: [phone]

## 2022-09-01 DIAGNOSIS — R410 Disorientation, unspecified: Secondary | ICD-10-CM | POA: Diagnosis not present

## 2022-09-01 DIAGNOSIS — R413 Other amnesia: Secondary | ICD-10-CM | POA: Diagnosis not present

## 2022-09-01 DIAGNOSIS — U099 Post covid-19 condition, unspecified: Secondary | ICD-10-CM

## 2022-09-01 DIAGNOSIS — M79605 Pain in left leg: Secondary | ICD-10-CM | POA: Diagnosis not present

## 2022-09-01 DIAGNOSIS — G609 Hereditary and idiopathic neuropathy, unspecified: Secondary | ICD-10-CM | POA: Diagnosis not present

## 2022-09-01 DIAGNOSIS — G9332 Myalgic encephalomyelitis/chronic fatigue syndrome: Secondary | ICD-10-CM

## 2022-09-01 DIAGNOSIS — G8929 Other chronic pain: Secondary | ICD-10-CM | POA: Diagnosis not present

## 2022-09-01 DIAGNOSIS — M79604 Pain in right leg: Secondary | ICD-10-CM

## 2022-09-02 ENCOUNTER — Ambulatory Visit: Payer: Medicare Other | Admitting: Physical Therapy

## 2022-09-04 NOTE — Therapy (Signed)
OUTPATIENT PHYSICAL THERAPY NEURO TREATMENT   Patient Name: Catherine Berg MRN: [id number] DOB:[birth year], 42 y.o., female Today's Date: 09/07/2022   PCP: [name], MD  REFERRING PROVIDER: Horton Chin, MD  END OF SESSION:  PT End of Session - 09/07/22 1445     Visit Number 3    Number of Visits 13    Date for PT Re-Evaluation 09/29/22    Authorization Type [employer]    Authorization Time Period approved 6 PT visits from 08/19/2022-10/17/2022    Authorization - Visit Number 3    Authorization - Number of Visits 6   combined   PT Start Time 1408   pt late   PT Stop Time 1444    PT Time Calculation (min) 36 min    Equipment Utilized During Treatment Gait belt    Activity Tolerance Patient tolerated treatment well    Behavior During Therapy WFL for tasks assessed/performed              Past Medical History:  Diagnosis Date   Abnormal thyroid blood test    Anemia    Anxiety    Asthma    Complication of anesthesia    itching after c sections   COVID-19 01/2020   Depression    Elevated serum glutamic pyruvic transaminase (SGPT) level    Graves disease    History of abnormal cervical Pap smear    History of cervical polypectomy    Hives 09/02/2015   Hypertension    Hypothyroidism    IFG (impaired fasting glucose)    Incidental lung nodule, > 3mm and < 8mm 03/31/2017   4 mm RLL lung nodule on chest CT Mar 31, 2017   Low serum vitamin D    Obesity    PONV (postoperative nausea and vomiting)    after c section had vomit   Pregnancy induced hypertension    with 1st pregnancy, normal pressure with 2nd   Reflux    Thyroid disease    Vitamin B12 deficiency    Vitamin D deficiency disease    Wears dentures    full upper   Past Surgical History:  Procedure Laterality Date   CERVICAL POLYPECTOMY     CESAREAN SECTION     x 2   COLONOSCOPY WITH PROPOFOL N/A 11/14/2018   Procedure: COLONOSCOPY WITH PROPOFOL;  Surgeon: Pasty Spillers,  MD;  Location: [facility];  Service: Endoscopy;  Laterality: N/A;   ESOPHAGOGASTRODUODENOSCOPY (EGD) WITH PROPOFOL N/A 11/14/2018   Procedure: ESOPHAGOGASTRODUODENOSCOPY (EGD) WITH PROPOFOL;  Surgeon: Pasty Spillers, MD;  Location: [facility];  Service: Endoscopy;  Laterality: N/A;  Latex allergy   GIVENS CAPSULE STUDY N/A 12/27/2018   Procedure: GIVENS CAPSULE STUDY;  Surgeon: Pasty Spillers, MD;  Location: [facility];  Service: Endoscopy;  Laterality: N/A;   Patient Active Problem List   Diagnosis Date Noted   MDD (major depressive disorder), recurrent episode, mild (HCC) 11/17/2021   Neuropathy involving both lower extremities 11/17/2021   Perennial allergic rhinitis with seasonal variation 11/17/2021   Chronic pain of both lower extremities 09/24/2020   Sleeps in sitting position due to orthopnea 07/02/2020   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   COVID-19 long hauler 07/02/2020   Vitamin D deficiency 04/22/2020   COVID-19 long hauler manifesting chronic fatigue 04/22/2020   History of COVID-19 02/28/2020   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   Memory loss 02/28/2020   Physical deconditioning 02/28/2020   Dyspnea on exertion 02/28/2020   Neck  pain, bilateral 02/28/2020   Muscle spasms of neck 02/28/2020   Arthralgia of hand 01/24/2019   CRP elevated 01/24/2019   ESR raised 01/24/2019   Class 3 severe obesity due to excess calories without serious comorbidity in adult [facility]) 01/24/2019   Palpitations 09/16/2018   Coronary artery calcification 09/16/2018   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   Bilateral leg edema 04/20/2017   Incidental lung nodule, > 3mm and < 8mm 03/31/2017   Hives 09/02/2015   Morbid obesity (HCC) 08/15/2015   Anxiety 07/09/2015   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 06/28/2015   Thyroid nodule 02/01/2015   Vitamin D deficiency disease    Vitamin B12 deficiency    History of abnormal cervical Pap smear    Allergy-induced asthma,  mild intermittent, uncomplicated 10/09/2013    ONSET DATE: 2021  REFERRING DIAG: G60.9 (ICD-10-CM) - Idiopathic peripheral neuropathy  THERAPY DIAG:  Muscle weakness (generalized)  Unsteadiness on feet  Other symptoms and signs involving the nervous system  Rationale for Evaluation and Treatment: Rehabilitation  SUBJECTIVE:                                                                                                                                                                                             SUBJECTIVE STATEMENT: Pulled the R HS chasing after her dog. Feeling about the same since it started. Went to see an MD who called in Meloxicam. Reports that she has not heard from [facility] about scheduling and transferring to their facility.   Pt accompanied by: self  PERTINENT HISTORY: Anemia, anxiety, asthma, depression, HTN, long COVID  PAIN:  Are you having pain? Yes: NPRS scale: 4 in B Les, 7 in R HS/10 Pain location: right posterior thigh and B lower legs  Pain description: sharp, burning, just there Aggravating factors: night Relieving factors: meds  PRECAUTIONS: Fall  WEIGHT BEARING RESTRICTIONS: No  FALLS: Has patient fallen in last 6 months? No  LIVING ENVIRONMENT: Lives with:  kids Lives in: House/apartment Stairs:  3 steps to enter without handrail and 6 for the back door with handrail; 1 story home  Has following equipment at home: None  PLOF: Independent;  was working full time and walking ~4 miles a day prior to 2021; getting long term disability since 2021  PATIENT GOALS: would like to be pain free and get back to my routine   OBJECTIVE:     TODAY'S TREATMENT: 09/07/22   M-CTSIB  Condition 1: Firm Surface, EO 30 Sec, Normal Sway  Condition 2: Firm Surface, EC 30 Sec, Normal and Mild Sway  Condition 3: Foam Surface, EO 30 Sec, Mild Sway  Condition  4: Foam Surface, EC 30 Sec, Moderate and Severe Sway     Activity Comments  standing heel/toe  raise 10x  In II bars   step up + opposite SKTC 2x5 In II bars, 1 UE support; good stability, slow and c/o LE fatigue   mini squat 2x10   Good form after demo; c/o mild R knee pain   tandem walk 2x 1 min  In II bars with 1 UE support  backwards walk x1 min B UE support; demo required   [place] x1 min      HOME EXERCISE PROGRAM Last updated: 09/08/22 Access Code: [email or web address] URL: [email or web address] Date: 09/07/2022 Prepared by: [facility] - Outpatient  Rehab - [facility]  Exercises - Sit to Stand with Arms Crossed  - 1 x daily - 5 x weekly - 2 sets - 10 reps - Gastroc Stretch on Wall  - 1 x daily - 5 x weekly - 2 sets - 30 sec hold - Standing Hip Abduction with Counter Support  - 1 x daily - 5 x weekly - 2 sets - 10 reps - Romberg Stance  - 1 x daily - 5 x weekly - 2 sets - 30 sec hold - Romberg Stance with Eyes Closed  - 1 x daily - 5 x weekly - 2 sets - 30 sec hold - Standing Single Leg Stance with Counter Support  - 1 x daily - 7 x weekly - 3 sets - 10 sec hold - Standing Knee Flexion AROM with Chair Support  - 1 x daily - 7 x weekly - 3 sets - 10 reps - Seated Table Hamstring Stretch  - 1 x daily - 7 x weekly - 3 sets - 60 sec (or 10 deep breaths) hold - Carioca with Counter Support  - 1 x daily - 5 x weekly - 2 sets - 10 reps   PATIENT EDUCATION: Education details: HEP update Person educated: Patient Education method: Explanation, Demonstration, Tactile cues, Verbal cues, and Handouts Education comprehension: verbalized understanding and returned demonstration     Below measures were taken at time of initial evaluation unless otherwise specified:  DIAGNOSTIC FINDINGS: none recent  COGNITION: Overall cognitive status: Within functional limits for tasks assessed   SENSATION: WFL to light touch   COORDINATION: Alternating pronation/supination: slightly decreased pace Alternating toe tap: difficulty coordinating L>R LEs Finger to nose: intact  B   POSTURE: No Significant postural limitations  LOWER EXTREMITY ROM:     Active  Right Eval Left Eval  Hip flexion    Hip extension    Hip abduction    Hip adduction    Hip internal rotation    Hip external rotation    Knee flexion    Knee extension    Ankle dorsiflexion 0 -10  Ankle plantarflexion    Ankle inversion    Ankle eversion     (Blank rows = not tested)  LOWER EXTREMITY MMT:    MMT (in sitting) Right Eval Left Eval  Hip flexion 4- 4-  Hip extension    Hip abduction 4- 4  Hip adduction 4- 4-  Hip internal rotation    Hip external rotation    Knee flexion 4- 4-  Knee extension 4 4-  Ankle dorsiflexion 4 4+  Ankle plantarflexion 4- 4-  Ankle inversion    Ankle eversion    (Blank rows = not tested)   GAIT: Gait pattern: slow gait pattern with limited B foot clearance, occasionally scuffing feet Assistive device  utilized: None Level of assistance: Modified independence   FUNCTIONAL TESTS:     GOALS: Goals reviewed with patient? Yes  SHORT TERM GOALS: Target date: 09/01/2022  Patient to be independent with initial HEP. Baseline: HEP initiated Goal status: MET    LONG TERM GOALS: Target date: 09/29/2022  Patient to be independent with advanced HEP. Baseline: Not yet initiated  Goal status: IN PROGRESS  Patient to demonstrate B LE strength >/=4/5.  Baseline: See above Goal status: IN PROGRESS  Patient to demonstrate at least 5 degrees of L ankle dorsiflexion AROM in order to improve safety with gait. Baseline: L -10 deg Goal status: IN PROGRESS  Patient to demonstrate alternating reciprocal pattern when ascending and descending stairs with good stability without handrail.    Baseline: limited eccentric control descending Goal status: IN PROGRESS  Patient to score at least 20/24 on DGI in order to decrease risk of falls.  Baseline: 18 Goal status: IN PROGRESS  Patient to demonstrate 5xSTS test in <13 sec in order to improve  muscle power with transfers. Baseline: 15.91 sec without Ues  Goal status: IN PROGRESS  Patient to report plans to start home or community fitness regimen with modifications as needed.  Baseline: no plans Goal status: IN PROGRESS   ASSESSMENT:  CLINICAL IMPRESSION:  Patient arrived to session with report of remaining R HS pain. Reports that she has not heard from [facility] about scheduling and transferring to their facility. Patient demonstrated mod-severe sway with multisensory balance testing. Worked on standing balance and gentle strengthening activities. Patient noted some LE fatigue and R knee pain during activities, but able to proceed. Short intermittent sit breaks provided d/t fatigue. Patient tolerated session well and without complaints upon leaving.    OBJECTIVE IMPAIRMENTS: Abnormal gait, decreased activity tolerance, decreased balance, decreased coordination, decreased endurance, difficulty walking, decreased ROM, decreased strength, impaired flexibility, and pain.   ACTIVITY LIMITATIONS: carrying, lifting, bending, standing, squatting, sleeping, stairs, transfers, bathing, toileting, dressing, hygiene/grooming, and caring for others  PARTICIPATION LIMITATIONS: meal prep, cleaning, laundry, driving, shopping, community activity, yard work, and church  PERSONAL FACTORS: Age, Past/current experiences, Time since onset of injury/illness/exacerbation, and 3+ comorbidities: Anemia, anxiety, asthma, depression, HTN  are also affecting patient's functional outcome.   REHAB POTENTIAL: Good  CLINICAL DECISION MAKING: Evolving/moderate complexity  EVALUATION COMPLEXITY: Moderate  PLAN:  PT FREQUENCY: 1-2x/week  PT DURATION: 6 weeks  PLANNED INTERVENTIONS: Therapeutic exercises, Therapeutic activity, Neuromuscular re-education, Balance training, Gait training, Patient/Family education, Self Care, Joint mobilization, Stair training, Vestibular training, Canalith repositioning, Aquatic  Therapy, Dry Needling, Electrical stimulation, Cryotherapy, Moist heat, Taping, Manual therapy, and Re-evaluation  PLAN FOR NEXT SESSION: review HEP; progress B LE strength, B ankle dorsiflexion ROM, test multisensory balance    Anette Guarneri, PT, DPT 09/07/22 2:47 PM  [facility] [address] [place], [place] [address] Phone # [phone] Fax # [phone]

## 2022-09-07 ENCOUNTER — Ambulatory Visit: Payer: Medicare Other | Attending: Physical Medicine and Rehabilitation | Admitting: Physical Therapy

## 2022-09-07 ENCOUNTER — Encounter: Payer: Self-pay | Admitting: Physical Therapy

## 2022-09-07 DIAGNOSIS — R29818 Other symptoms and signs involving the nervous system: Secondary | ICD-10-CM | POA: Diagnosis not present

## 2022-09-07 DIAGNOSIS — M6281 Muscle weakness (generalized): Secondary | ICD-10-CM | POA: Insufficient documentation

## 2022-09-07 DIAGNOSIS — R2681 Unsteadiness on feet: Secondary | ICD-10-CM | POA: Insufficient documentation

## 2022-09-07 DIAGNOSIS — S76319A Strain of muscle, fascia and tendon of the posterior muscle group at thigh level, unspecified thigh, initial encounter: Secondary | ICD-10-CM | POA: Insufficient documentation

## 2022-09-07 DIAGNOSIS — S76311D Strain of muscle, fascia and tendon of the posterior muscle group at thigh level, right thigh, subsequent encounter: Secondary | ICD-10-CM | POA: Diagnosis not present

## 2022-09-08 ENCOUNTER — Encounter: Payer: Medicare Other | Attending: Physical Medicine and Rehabilitation

## 2022-09-08 DIAGNOSIS — G609 Hereditary and idiopathic neuropathy, unspecified: Secondary | ICD-10-CM | POA: Diagnosis present

## 2022-09-08 DIAGNOSIS — U099 Post covid-19 condition, unspecified: Secondary | ICD-10-CM | POA: Insufficient documentation

## 2022-09-08 DIAGNOSIS — G8929 Other chronic pain: Secondary | ICD-10-CM | POA: Diagnosis present

## 2022-09-08 DIAGNOSIS — F419 Anxiety disorder, unspecified: Secondary | ICD-10-CM | POA: Insufficient documentation

## 2022-09-08 DIAGNOSIS — M79604 Pain in right leg: Secondary | ICD-10-CM | POA: Diagnosis present

## 2022-09-08 DIAGNOSIS — M79605 Pain in left leg: Secondary | ICD-10-CM | POA: Diagnosis present

## 2022-09-08 DIAGNOSIS — G9332 Myalgic encephalomyelitis/chronic fatigue syndrome: Secondary | ICD-10-CM | POA: Diagnosis present

## 2022-09-08 DIAGNOSIS — R413 Other amnesia: Secondary | ICD-10-CM | POA: Insufficient documentation

## 2022-09-09 ENCOUNTER — Ambulatory Visit: Payer: Medicare Other | Admitting: Physical Therapy

## 2022-09-10 NOTE — Progress Notes (Signed)
   Behavioral Observations:  The patient appeared well-groomed and appropriately dressed. Her manners were polite and appropriate to the situation. The patient's attitude towards testing was positive and her effort was good.   Neuropsychology Note  [name] completed 80 minutes of neuropsychological testing with technician, Staci Acosta, BA, under the supervision of Arley Phenix, PsyD., Clinical Neuropsychologist. The patient did not appear overtly distressed by the testing session, per behavioral observation or via self-report to the technician. Rest breaks were offered.   Clinical Decision Making: In considering the patient's current level of functioning, level of presumed impairment, nature of symptoms, emotional and behavioral responses during clinical interview, level of literacy, and observed level of motivation/effort, a battery of tests was selected by Dr. Kieth Brightly during initial consultation on 03/18/2021. This was communicated to the technician. Communication between the neuropsychologist and technician was ongoing throughout the testing session and changes were made as deemed necessary based on patient performance on testing, technician observations and additional pertinent factors such as those listed above.  Tests Administered: Comprehensive Attention Battery (CAB) Continuous Performance Test (CPT)  Results: Will be included in final report   Feedback to Patient: Catherine Berg will return on 06/22/2023 for an interactive feedback session with Dr. Kieth Brightly at which time her test performances, clinical impressions and treatment recommendations will be reviewed in detail. The patient understands she can contact our office should she require our assistance before this time.  80 minutes spent face-to-face with patient administering standardized tests, 30 minutes spent scoring Radiographer, therapeutic). [CPT [id number], [id number]]  Full report to follow.

## 2022-09-14 ENCOUNTER — Telehealth: Payer: Self-pay

## 2022-09-14 ENCOUNTER — Ambulatory Visit: Payer: Medicaid Other | Admitting: Physical Therapy

## 2022-09-14 NOTE — Telephone Encounter (Signed)
Called and left voicemail to check in on patient and to advise pt of her limited appointments due to Medicaid.  Pt advised to contact clinic for any questions or clarification of appointments.    Nolon Bussing, PT, DPT Physical Therapist - [facility]  09/14/22, 12:45 PM

## 2022-09-16 ENCOUNTER — Ambulatory Visit: Payer: Medicaid Other

## 2022-09-17 NOTE — Progress Notes (Signed)
Behavioral Observations:  The patient appeared well-groomed and appropriately dressed. Her manners were polite and appropriate to the situation. The patient's attitude towards testing was positive and her effort was good.   Neuropsychology Note  Catherine Berg completed 140 minutes of neuropsychological testing with technician, Staci Acosta, BA, under the supervision of Arley Phenix, PsyD., Clinical Neuropsychologist. The patient did not appear overtly distressed by the testing session, per behavioral observation or via self-report to the technician. Rest breaks were offered.   Clinical Decision Making: In considering the patient's current level of functioning, level of presumed impairment, nature of symptoms, emotional and behavioral responses during clinical interview, level of literacy, and observed level of motivation/effort, a battery of tests was selected by Dr. Kieth Brightly during initial consultation on 03/18/2021. This was communicated to the technician. Communication between the neuropsychologist and technician was ongoing throughout the testing session and changes were made as deemed necessary based on patient performance on testing, technician observations and additional pertinent factors such as those listed above.  Tests Administered: Wechsler Adult Intelligence Scale, 4th Edition (WAIS-IV) Wechsler Memory Scale, 4th Edition (WMS-IV); Adult Battery    Results: WAIS-IV:  Composite Score Summary  Scale Sum of Scaled Scores Composite Score Percentile Rank 95% Conf. Interval Qualitative Description  Verbal Comprehension 17 VCI 76 5 71-83 Borderline  Perceptual Reasoning 18 PRI 77 6 72-84 Borderline  Working Memory 15 WMI 86 18 80-94 Low Average  Full Scale 62 FSIQ 74 4 70-79 Borderline  General Ability 35 GAI 74 4 70-80 Borderline   Verbal Comprehension Subtests Summary  Subtest Raw Score Scaled Score Percentile Rank Reference Group Scaled Score SEM  Similarities 14 4  2 5  1.04  Vocabulary 22 6 9 7  0.73  Information 9 7 16 8  0.85    Perceptual Reasoning Subtests Summary  Subtest Raw Score Scaled Score Percentile Rank Reference Group Scaled Score SEM  Block Design 28 7 16 6  0.99  Matrix Reasoning 7 4 2 3  0.95  Visual Puzzles 10 7 16 7  1.04   Working Librarian, academic Raw Score Scaled Score Percentile Rank Reference Group Scaled Score SEM  Digit Span 24 8 25 8  0.73  Arithmetic 10 7 16 7  0.99   Processing Speed Subtests Summary  Subtest Raw Score Scaled Score Percentile Rank Reference Group Scaled Score SEM  Symbol Search 21 6 9 6  1.56   WMS-IV:   Index Score Summary  Index Sum of Scaled Scores Index Score Percentile Rank 95% Confidence Interval Qualitative Descriptor  Auditory Memory (AMI) 21 72 3 67-80 Borderline  Visual Memory (VMI) 24 76 5 71-83 Borderline  Visual Working Memory (VWMI) 14 83 13 77-91 Low Average  Immediate Memory (IMI) 22 70 2 65-78 Borderline  Delayed Memory (DMI) 23 70 2 65-79 Borderline    Primary Subtest Scaled Score Summary  Subtest Domain Raw Score Scaled Score Percentile Rank  Logical Memory I AM 15 5 5   Logical Memory II AM 10 5 5   Verbal Paired Associates I AM 14 5 5   Verbal [facility] Associates II AM 6 6 9   Designs I VM 43 4 2  Designs II VM 35 5 5  Visual Reproduction I VM 33 8 25  Visual Reproduction II VM 17 7 16   Spatial Addition VWM 10 7 16   Symbol Span VWM 16 7 16     Auditory Memory Process Score Summary  Process Score Raw Score Scaled Score Percentile Rank Cumulative Percentage (Base Rate)  LM II Recognition 26 - -  51-75%  VPA II Recognition 31 - - 3-9%   Visual Memory Process Score Summary  Process Score Raw Score Scaled Score Percentile Rank Cumulative Percentage (Base Rate)  DE I Content 27 5 5  -  DE I Spatial 12 6 9  -  DE II Content 24 5 5  -  DE II Spatial 9 7 16  -  DE II Recognition 11 - - 3-9%  VR II Recognition 4 - - 10-16%   ABILITY-MEMORY ANALYSIS  Ability  Score:  VCI: 76 Date of Testing:  WAIS-IV; WMS-IV 2022/09/01  Predicted Difference Method   Index Predicted WMS-IV Index Score Actual WMS-IV Index Score Difference Critical Value  Significant Difference Y/N Base Rate  Auditory Memory 88 72 16 8.91 Y 10-15%  Visual Memory 89 76 13 8.02 Y 15-20%  Visual Working Memory 87 83 4 11.90 N   Immediate Memory 86 70 16 9.48 Y 10%  Delayed Memory 88 70 18 10.18 Y 5-10%  Statistical significance (critical value) at the .01 level.   Feedback to Patient: Catherine Berg will return on 06/22/2023 for an interactive feedback session with Dr. Kieth Brightly at which time her test performances, clinical impressions and treatment recommendations will be reviewed in detail. The patient understands she can contact our office should she require our assistance before this time.  140 minutes spent face-to-face with patient administering standardized tests, 30 minutes spent scoring Radiographer, therapeutic). [CPT [id number], [id number]]  Full report to follow.

## 2022-09-21 ENCOUNTER — Ambulatory Visit: Payer: Medicaid Other | Admitting: Physical Therapy

## 2022-09-22 ENCOUNTER — Ambulatory Visit: Payer: Medicare Other | Attending: Family Medicine

## 2022-09-22 ENCOUNTER — Encounter: Payer: Medicare Other | Admitting: Physical Medicine and Rehabilitation

## 2022-09-22 VITALS — BP 140/85 | HR 76 | Ht 63.0 in | Wt 235.0 lb

## 2022-09-22 DIAGNOSIS — U099 Post covid-19 condition, unspecified: Secondary | ICD-10-CM | POA: Diagnosis not present

## 2022-09-22 DIAGNOSIS — M6281 Muscle weakness (generalized): Secondary | ICD-10-CM | POA: Insufficient documentation

## 2022-09-22 DIAGNOSIS — G609 Hereditary and idiopathic neuropathy, unspecified: Secondary | ICD-10-CM | POA: Diagnosis not present

## 2022-09-22 DIAGNOSIS — R2681 Unsteadiness on feet: Secondary | ICD-10-CM | POA: Diagnosis present

## 2022-09-22 DIAGNOSIS — M79604 Pain in right leg: Secondary | ICD-10-CM | POA: Diagnosis not present

## 2022-09-22 DIAGNOSIS — G9332 Myalgic encephalomyelitis/chronic fatigue syndrome: Secondary | ICD-10-CM

## 2022-09-22 DIAGNOSIS — M79605 Pain in left leg: Secondary | ICD-10-CM | POA: Diagnosis not present

## 2022-09-22 DIAGNOSIS — R29818 Other symptoms and signs involving the nervous system: Secondary | ICD-10-CM | POA: Insufficient documentation

## 2022-09-22 DIAGNOSIS — R413 Other amnesia: Secondary | ICD-10-CM | POA: Diagnosis not present

## 2022-09-22 DIAGNOSIS — F419 Anxiety disorder, unspecified: Secondary | ICD-10-CM | POA: Diagnosis not present

## 2022-09-22 DIAGNOSIS — G8929 Other chronic pain: Secondary | ICD-10-CM | POA: Diagnosis not present

## 2022-09-22 MED ORDER — TOPIRAMATE 100 MG PO TABS
100.0000 mg | ORAL_TABLET | Freq: Every evening | ORAL | 3 refills | Status: DC
Start: 2022-09-22 — End: 2022-12-24

## 2022-09-22 MED ORDER — CAPSAICIN-CLEANSING GEL 8 % EX KIT
4.0000 | PACK | Freq: Once | CUTANEOUS | Status: AC
Start: 2022-09-22 — End: 2022-09-22
  Administered 2022-09-22: 4 via TOPICAL

## 2022-09-22 NOTE — Addendum Note (Signed)
Addended by: Silas Sacramento T on: 09/22/2022 11:55 AM   Modules accepted: Orders

## 2022-09-22 NOTE — Therapy (Signed)
OUTPATIENT PHYSICAL THERAPY NEURO TREATMENT   Patient Name: Catherine Berg MRN: [id number] DOB:1981/03/23, 42 y.o., female Today's Date: 09/22/2022   PCP: [name], MD  REFERRING PROVIDER: Horton Chin, MD  END OF SESSION:  PT End of Session - 09/22/22 0949     Visit Number 4    Number of Visits 13    Date for PT Re-Evaluation 09/29/22    Authorization Type [employer]    Authorization Time Period approved 6 PT visits from 08/19/2022-10/17/2022    Authorization - Visit Number 4    Authorization - Number of Visits 6   combined   PT Start Time 0948    PT Stop Time 1030    PT Time Calculation (min) 42 min    Equipment Utilized During Treatment Gait belt    Activity Tolerance Patient tolerated treatment well    Behavior During Therapy WFL for tasks assessed/performed              Past Medical History:  Diagnosis Date   Abnormal thyroid blood test    Anemia    Anxiety    Asthma    Complication of anesthesia    itching after c sections   COVID-19 01/2020   Depression    Elevated serum glutamic pyruvic transaminase (SGPT) level    Graves disease    History of abnormal cervical Pap smear    History of cervical polypectomy    Hives 09/02/2015   Hypertension    Hypothyroidism    IFG (impaired fasting glucose)    Incidental lung nodule, > 3mm and < 8mm 03/31/2017   4 mm RLL lung nodule on chest CT Mar 31, 2017   Low serum vitamin D    Obesity    PONV (postoperative nausea and vomiting)    after c section had vomit   Pregnancy induced hypertension    with 1st pregnancy, normal pressure with 2nd   Reflux    Thyroid disease    Vitamin B12 deficiency    Vitamin D deficiency disease    Wears dentures    full upper   Past Surgical History:  Procedure Laterality Date   CERVICAL POLYPECTOMY     CESAREAN SECTION     x 2   COLONOSCOPY WITH PROPOFOL N/A 11/14/2018   Procedure: COLONOSCOPY WITH PROPOFOL;  Surgeon: Pasty Spillers, MD;   Location: [facility];  Service: Endoscopy;  Laterality: N/A;   ESOPHAGOGASTRODUODENOSCOPY (EGD) WITH PROPOFOL N/A 11/14/2018   Procedure: ESOPHAGOGASTRODUODENOSCOPY (EGD) WITH PROPOFOL;  Surgeon: Pasty Spillers, MD;  Location: [facility];  Service: Endoscopy;  Laterality: N/A;  Latex allergy   GIVENS CAPSULE STUDY N/A 12/27/2018   Procedure: GIVENS CAPSULE STUDY;  Surgeon: Pasty Spillers, MD;  Location: [facility];  Service: Endoscopy;  Laterality: N/A;   Patient Active Problem List   Diagnosis Date Noted   MDD (major depressive disorder), recurrent episode, mild (HCC) 11/17/2021   Neuropathy involving both lower extremities 11/17/2021   Perennial allergic rhinitis with seasonal variation 11/17/2021   Chronic pain of both lower extremities 09/24/2020   Sleeps in sitting position due to orthopnea 07/02/2020   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   COVID-19 long hauler 07/02/2020   Vitamin D deficiency 04/22/2020   COVID-19 long hauler manifesting chronic fatigue 04/22/2020   History of COVID-19 02/28/2020   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   Memory loss 02/28/2020   Physical deconditioning 02/28/2020   Dyspnea on exertion 02/28/2020   Neck pain, bilateral 02/28/2020  Muscle spasms of neck 02/28/2020   Arthralgia of hand 01/24/2019   CRP elevated 01/24/2019   ESR raised 01/24/2019   Class 3 severe obesity due to excess calories without serious comorbidity in adult [facility]) 01/24/2019   Palpitations 09/16/2018   Coronary artery calcification 09/16/2018   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   Bilateral leg edema 04/20/2017   Incidental lung nodule, > 3mm and < 8mm 03/31/2017   Hives 09/02/2015   Morbid obesity (HCC) 08/15/2015   Anxiety 07/09/2015   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 06/28/2015   Thyroid nodule 02/01/2015   Vitamin D deficiency disease    Vitamin B12 deficiency    History of abnormal cervical Pap smear    Allergy-induced asthma, mild  intermittent, uncomplicated 10/09/2013    ONSET DATE: 2021  REFERRING DIAG: G60.9 (ICD-10-CM) - Idiopathic peripheral neuropathy  THERAPY DIAG:  Muscle weakness (generalized)  Unsteadiness on feet  Other symptoms and signs involving the nervous system  Rationale for Evaluation and Treatment: Rehabilitation  SUBJECTIVE:                                                                                                                                                                                             SUBJECTIVE STATEMENT:  Pt reports no new complaints since the last visit.  Pt denies any new falls since the last visit as well.  Pt accompanied by: self  PERTINENT HISTORY: Anemia, anxiety, asthma, depression, HTN, long COVID  PAIN:  Are you having pain? Yes: NPRS scale: 0/10 Pain location:   Pain description: sharp, burning, just there Aggravating factors: night Relieving factors: meds  PRECAUTIONS: Fall  WEIGHT BEARING RESTRICTIONS: No  FALLS: Has patient fallen in last 6 months? No  LIVING ENVIRONMENT: Lives with:  kids Lives in: House/apartment Stairs:  3 steps to enter without handrail and 6 for the back door with handrail; 1 story home  Has following equipment at home: None  PLOF: Independent;  was working full time and walking ~4 miles a day prior to 2021; getting long term disability since 2021  PATIENT GOALS: would like to be pain free and get back to my routine   OBJECTIVE:   TODAY'S TREATMENT:  09/22/22  TherEx:  Standing calf raises with UE support, 2x10 Standing heel raises with UE support, 2x10 Standing mini squats with UE support, 2x10 Standing hip abduction at Matrix machine, 55#, 2x10 each LE Standing hip extension at Matrix machine, 55#, 2x10 each LE Standing hip flexion (marches) at [employer] machine, 55#, 2x10 each LE Standing hip adduction at Matrix machine, 55#, 2x10 each LE Standing hip hikes from 6" step, UE support, 2x10  each  side       HOME EXERCISE PROGRAM Last updated: 09/08/22 Access Code: [email or web address] URL: [email or web address] Date: 09/07/2022 Prepared by: [facility] - Outpatient  Rehab - [facility]  Exercises - Sit to Stand with Arms Crossed  - 1 x daily - 5 x weekly - 2 sets - 10 reps - Gastroc Stretch on Wall  - 1 x daily - 5 x weekly - 2 sets - 30 sec hold - Standing Hip Abduction with Counter Support  - 1 x daily - 5 x weekly - 2 sets - 10 reps - Romberg Stance  - 1 x daily - 5 x weekly - 2 sets - 30 sec hold - Romberg Stance with Eyes Closed  - 1 x daily - 5 x weekly - 2 sets - 30 sec hold - Standing Single Leg Stance with Counter Support  - 1 x daily - 7 x weekly - 3 sets - 10 sec hold - Standing Knee Flexion AROM with Chair Support  - 1 x daily - 7 x weekly - 3 sets - 10 reps - Seated Table Hamstring Stretch  - 1 x daily - 7 x weekly - 3 sets - 60 sec (or 10 deep breaths) hold - Carioca with Counter Support  - 1 x daily - 5 x weekly - 2 sets - 10 reps   PATIENT EDUCATION: Education details: HEP update Person educated: Patient Education method: Explanation, Demonstration, Tactile cues, Verbal cues, and Handouts Education comprehension: verbalized understanding and returned demonstration     Below measures were taken at time of initial evaluation unless otherwise specified:  DIAGNOSTIC FINDINGS: none recent  COGNITION: Overall cognitive status: Within functional limits for tasks assessed   SENSATION: WFL to light touch   COORDINATION: Alternating pronation/supination: slightly decreased pace Alternating toe tap: difficulty coordinating L>R LEs Finger to nose: intact B   POSTURE: No Significant postural limitations  LOWER EXTREMITY ROM:     Active  Right Eval Left Eval  Hip flexion    Hip extension    Hip abduction    Hip adduction    Hip internal rotation    Hip external rotation    Knee flexion    Knee extension    Ankle dorsiflexion 0 -10  Ankle  plantarflexion    Ankle inversion    Ankle eversion     (Blank rows = not tested)  LOWER EXTREMITY MMT:    MMT (in sitting) Right Eval Left Eval  Hip flexion 4- 4-  Hip extension    Hip abduction 4- 4  Hip adduction 4- 4-  Hip internal rotation    Hip external rotation    Knee flexion 4- 4-  Knee extension 4 4-  Ankle dorsiflexion 4 4+  Ankle plantarflexion 4- 4-  Ankle inversion    Ankle eversion    (Blank rows = not tested)   GAIT: Gait pattern: slow gait pattern with limited B foot clearance, occasionally scuffing feet Assistive device utilized: None Level of assistance: Modified independence   FUNCTIONAL TESTS:     GOALS: Goals reviewed with patient? Yes  SHORT TERM GOALS: Target date: 09/01/2022  Patient to be independent with initial HEP. Baseline: HEP initiated Goal status: MET    LONG TERM GOALS: Target date: 09/29/2022  Patient to be independent with advanced HEP. Baseline: Not yet initiated  Goal status: IN PROGRESS  Patient to demonstrate B LE strength >/=4/5.  Baseline: See above Goal status: IN PROGRESS  Patient to demonstrate at  least 5 degrees of L ankle dorsiflexion AROM in order to improve safety with gait. Baseline: L -10 deg Goal status: IN PROGRESS  Patient to demonstrate alternating reciprocal pattern when ascending and descending stairs with good stability without handrail.    Baseline: limited eccentric control descending Goal status: IN PROGRESS  Patient to score at least 20/24 on DGI in order to decrease risk of falls.  Baseline: 18 Goal status: IN PROGRESS  Patient to demonstrate 5xSTS test in <13 sec in order to improve muscle power with transfers. Baseline: 15.91 sec without Ues  Goal status: IN PROGRESS  Patient to report plans to start home or community fitness regimen with modifications as needed.  Baseline: no plans Goal status: IN PROGRESS   ASSESSMENT:  CLINICAL IMPRESSION:  Pt performed well with the  exercises given and notes fatigue at the conclusion.  Pt introduced to more hip strengthening exercises at this visit and performed well with the resistance noted above.  Pt does require rest for a few seconds prior to performing each set, otherwise is doing well.  Pt will continue to benefit from strengthening and balance related exercises in order to improve balance and stability.   Pt will continue to benefit from skilled therapy to address remaining deficits in order to improve overall QoL and return to PLOF.       OBJECTIVE IMPAIRMENTS: Abnormal gait, decreased activity tolerance, decreased balance, decreased coordination, decreased endurance, difficulty walking, decreased ROM, decreased strength, impaired flexibility, and pain.   ACTIVITY LIMITATIONS: carrying, lifting, bending, standing, squatting, sleeping, stairs, transfers, bathing, toileting, dressing, hygiene/grooming, and caring for others  PARTICIPATION LIMITATIONS: meal prep, cleaning, laundry, driving, shopping, community activity, yard work, and church  PERSONAL FACTORS: Age, Past/current experiences, Time since onset of injury/illness/exacerbation, and 3+ comorbidities: Anemia, anxiety, asthma, depression, HTN  are also affecting patient's functional outcome.   REHAB POTENTIAL: Good  CLINICAL DECISION MAKING: Evolving/moderate complexity  EVALUATION COMPLEXITY: Moderate  PLAN:  PT FREQUENCY: 1-2x/week  PT DURATION: 6 weeks  PLANNED INTERVENTIONS: Therapeutic exercises, Therapeutic activity, Neuromuscular re-education, Balance training, Gait training, Patient/Family education, Self Care, Joint mobilization, Stair training, Vestibular training, Canalith repositioning, Aquatic Therapy, Dry Needling, Electrical stimulation, Cryotherapy, Moist heat, Taping, Manual therapy, and Re-evaluation  PLAN FOR NEXT SESSION: review HEP; progress B LE strength, B ankle dorsiflexion ROM, test multisensory balance     Nolon Bussing,  PT, DPT Physical Therapist - [facility]  [facility]  09/22/22, 12:57 PM

## 2022-09-22 NOTE — Progress Notes (Signed)
Subjective:    Patient ID: Catherine Berg, female    DOB: [birth year], 42 y.o.   MRN: [id number]  HPI 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 It makes her sleepy.  -she has tried a couple of things but nothing has helped -pain feels burning  -extends from her thighs to ankle -she tries to rest her legs so they do not hurt as badly at night.  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  -the lack of control really bothers her -some days this really gets to her -she forgets to pay bills if she does not do it right away.  -she feels that not having insurance has put her in behind from getting better -she talked to her brother on the way up here.   2) confusion:  -it is embarrassing to her -she did speech language therapy, she has not been able to do any more because of lack of insurance -she holds back on her medicine when she notes it affecting her cognition -she feels this is one of her top challenges daily.  -she had been out of work for a long time before she came to see me -they put it in that she resigned her work so she lost her benefits.  -she saw Dr. Kieth Brightly.   3) Peripheral neuropathy -has been severe this past weekend -topamax was helping before but is no longer helping -she gets long term disability through her job, but SSI was denied- she is hoping they will change her mind. If she could get this she would get medicaid too. 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 Because she has assets (she  has a home, she has a car, and her son has a car), they make it difficult for her to get any services.  -her feet have itching sensation in the top really bad -today has been really hurting.  -feels separated up the legs -she is ready to try Qutenza today  4) Bilateral ankle pain   Pain Inventory Average Pain 8 Pain Right Now 7 My pain is dull and aching  In the last 24 hours, has pain interfered with the following? General activity 1 Relation with others 3 Enjoyment of life 1 What TIME of day is your pain at its worst? varies Sleep (in general) Poor  Pain is worse with: inactivity Pain improves with: medication Relief from Meds: 2    Family History  Problem Relation Age of Onset   Heart attack Mother 76   Hypertension Mother    Asthma Mother    Cancer Mother        laryngeal   Diabetes Mother        pre diabetic   Heart disease Mother    Mental illness Mother    Alcohol abuse Mother    Drug abuse Mother    Depression Mother    Anxiety disorder Mother    Bipolar disorder Mother    Learning disabilities  Brother    ADD / ADHD Brother    Asthma Son    Hyperlipidemia Brother    Alcohol abuse Father    Heart disease Maternal Grandmother        heart attack, pacemaker   Heart attack Maternal Grandmother    Hypertension Maternal Grandmother    Hypertension Maternal Grandfather    Heart disease Paternal Grandmother        couple of major open heart surgeries, leaking valves   Hypertension Paternal Grandmother    Hypertension Paternal Grandfather    Breast cancer Neg Hx    Social History   Socioeconomic History   Marital status: Single    Spouse name: Not on file   Number of children: 2   Years of education: 11   Highest education level: High school graduate  Occupational History   Not on file  Tobacco Use   Smoking status: Never   Smokeless tobacco: Never  Vaping Use   Vaping Use: Never used  Substance and Sexual Activity   Alcohol use: No     Alcohol/week: 0.0 standard drinks of alcohol   Drug use: No   Sexual activity: Yes    Partners: Male    Birth control/protection: None  Other Topics Concern   Not on file  Social History Narrative   Not on file   Social Determinants of Health   Financial Resource Strain: Medium Risk (06/17/2022)   Overall Financial Resource Strain (CARDIA)    Difficulty of Paying Living Expenses: Somewhat hard  Food Insecurity: Food Insecurity Present (06/17/2022)   Hunger Vital Sign    Worried About Running Out of Food in the Last Year: Sometimes true    Ran Out of Food in the Last Year: Patient declined  Transportation Needs: No Transportation Needs (06/17/2022)   PRAPARE - Administrator, Civil Service (Medical): No    Lack of Transportation (Non-Medical): No  Physical Activity: Inactive (06/17/2022)   Exercise Vital Sign    Days of Exercise per Week: 0 days    Minutes of Exercise per Session: 0 min  Stress: Stress Concern Present (06/17/2022)   [employer] of Occupational Health - Occupational Stress Questionnaire    Feeling of Stress : Very much  Social Connections: Moderately Isolated (06/17/2022)   Social Connection and Isolation Panel [NHANES]    Frequency of Communication with Friends and Family: Twice a week    Frequency of Social Gatherings with Friends and Family: Once a week    Attends Religious Services: Never    Database administrator or Organizations: Yes    Attends Banker Meetings: 1 to 4 times per year    Marital Status: Never married   Past Surgical History:  Procedure Laterality Date   CERVICAL POLYPECTOMY     CESAREAN SECTION     x 2   COLONOSCOPY WITH PROPOFOL N/A 11/14/2018   Procedure: COLONOSCOPY WITH PROPOFOL;  Surgeon: Pasty Spillers, MD;  Location[facility];  Service: Endoscopy;  Laterality: N/A;   ESOPHAGOGASTRODUODENOSCOPY (EGD) WITH PROPOFOL N/A 11/14/2018   Procedure: ESOPHAGOGASTRODUODENOSCOPY (EGD) WITH PROPOFOL;   Surgeon: Pasty Spillers, MD;  Location: [facility];  Service: Endoscopy;  Laterality: N/A;  Latex allergy   GIVENS CAPSULE STUDY N/A 12/27/2018   Procedure: GIVENS CAPSULE STUDY;  Surgeon: Pasty Spillers, MD;  Location: [facility];  Service: Endoscopy;  Laterality: N/A;   Past Medical History:  Diagnosis Date   Abnormal thyroid blood test  Anemia    Anxiety    Asthma    Complication of anesthesia    itching after c sections   COVID-19 01/2020   Depression    Elevated serum glutamic pyruvic transaminase (SGPT) level    Graves disease    History of abnormal cervical Pap smear    History of cervical polypectomy    Hives 09/02/2015   Hypertension    Hypothyroidism    IFG (impaired fasting glucose)    Incidental lung nodule, > 3mm and < 8mm 03/31/2017   4 mm RLL lung nodule on chest CT Mar 31, 2017   Low serum vitamin D    Obesity    PONV (postoperative nausea and vomiting)    after c section had vomit   Pregnancy induced hypertension    with 1st pregnancy, normal pressure with 2nd   Reflux    Thyroid disease    Vitamin B12 deficiency    Vitamin D deficiency disease    Wears dentures    full upper   BP (!) 140/85   Pulse 76   Ht 5\' 3"  (1.6 m)   Wt 235 lb (106.6 kg)   SpO2 99%   BMI 41.63 kg/m   Opioid Risk Score:   Fall Risk Score:  `1  Depression screen PHQ 2/9     08/26/2022    8:00 AM 06/17/2022    9:27 AM 05/25/2022    9:13 AM 02/03/2022    1:18 PM 11/17/2021    1:08 PM 09/09/2021   11:32 AM 06/17/2021    3:00 PM  Depression screen PHQ 2/9  Decreased Interest 3 2 2 3 1  1   Down, Depressed, Hopeless 2 1 1 3 2  1   PHQ - 2 Score 5 3 3 6 3  2   Altered sleeping 3 3 2   0    Tired, decreased energy 3 3 2  2     Change in appetite 0 0 0  0    Feeling bad or failure about yourself  0 0 0  1    Trouble concentrating 2 0 0  0    Moving slowly or fidgety/restless 0 0 0  0    Suicidal thoughts 0 0 0  0    PHQ-9 Score 13 9 7  6     Difficult doing  work/chores   Somewhat difficult         Information is confidential and restricted. Go to Review Flowsheets to unlock data.       Review of Systems  Constitutional: Negative.   HENT: Negative.    Eyes: Negative.   Respiratory: Negative.    Cardiovascular: Negative.   Gastrointestinal: Negative.   Endocrine: Negative.   Genitourinary: Negative.   Musculoskeletal: Negative.        Pain from thighs to lower legs in both legs  Skin: Negative.   Allergic/Immunologic: Negative.   Neurological: Negative.   Hematological: Negative.   Psychiatric/Behavioral:  Positive for dysphoric mood. Negative for confusion. The patient is not nervous/anxious.   All other systems reviewed and are negative.      Objective:   Physical Exam Gen: no distress, normal appearing HEENT: oral mucosa pink and moist, NCAT Cardio: Reg rate Chest: normal effort, normal rate of breathing Abd: soft, non-distended Ext: no edema Psych: pleasant, normal affect Skin: intact, no open lesions on feet Neuro: Alert and oriented     Assessment & Plan:  Mrs. Viale is a 42 year old woman who presents to  establish care for bilateral lower extremity pain, neuropathy, and long COVID syndrome  1) Bilateral lower extremity neuropathy s/p LONG COVID syndrome continue metformin 500mg  daily. Cr reviewed and is 0.9  -Discussed Qutenza as an option for neuropathic pain control. Discussed that this is a capsaicin patch, stronger than capsaicin cream. Discussed that it is currently approved for diabetic peripheral neuropathy and post-herpetic neuralgia, but that it has also shown benefit in treating other forms of neuropathy. Provided patient with link to site to learn more about the patch: https://www.clark.biz/. Discussed that the patch would be placed in office and benefits usually last 3 months. Discussed that unintended exposure to capsaicin can cause severe irritation of eyes, mucous membranes, respiratory tract, and  skin, but that Qutenza is a local treatment and does not have the systemic side effects of other nerve medications. Discussed that there may be pain, itching, erythema, and decreased sensory function associated with the application of Qutenza. Side effects usually subside within 1 week. A cold pack of analgesic medications can help with these side effects. Blood pressure can also be increased due to pain associated with administration of the patch.   4 patches of Qutenza was applied to the area of pain. Ice packs were applied during the procedure to ensure patient comfort. Blood pressure was monitored every 15 minutes. The patient tolerated the procedure well. Post-procedure instructions were given and follow-up has been scheduled.    -increase topamax to 100mg  HS  -Discussed current symptoms of pain and history of pain.  -discussed her disability -discussed that she was unable to get cognitive testing -discussed her response to ibuprofen and tylenol -Discussed benefits of exercise in reducing pain. -Continue gabapentin to 300mg  TID PRN. Discussed that this helped.  -completing disability paperwork for her  -discussed that level of pain is worse today since she had PT today, current level is 6/10  -prescribed low dose naltrexone in the past but she currently does not have insurance.  -Provided with a pain relief journal and discussed that it contains foods and lifestyle tips to naturally help to improve pain. Discussed that these lifestyle strategies are also very good for health unlike some medications which can have negative side effects. Discussed that the act of keeping a journal can be therapeutic and helpful to realize patterns what helps to trigger and alleviate pain.   -Discussed current symptoms of pain and history of pain.  -Discussed benefits of exercise in reducing pain. -Discussed following foods that may reduce pain: 1) Ginger (especially studied for arthritis)- reduce leukotriene  production to decrease inflammation 2) Blueberries- high in phytonutrients that decrease inflammation 3) Salmon- marine omega-3s reduce joint swelling and pain 4) Pumpkin seeds- reduce inflammation 5) dark chocolate- reduces inflammation 6) turmeric- reduces inflammation 7) tart cherries - reduce pain and stiffness 8) extra virgin olive oil - its compound olecanthal helps to block prostaglandins  9) chili peppers- can be eaten or applied topically via capsaicin 10) mint- helpful for headache, muscle aches, joint pain, and itching 11) garlic- reduces inflammation  Link to further information on diet for chronic pain: http://www.bray.com/    2) Expressive aphasia -very embarrassing to her -continue SLP HEP -continue to follow with neuropsych once she has Medicaid to pay for their appointments -discussed her conversation with Dr. Kieth Brightly which was very helpful for her.   3) Impaired memory -discussed how difficult it is for her as other people cannot see this -discussed that her family is supportive.   4) Insomnia: -Try to go  outside near sunrise -Get exercise during the day.  -Turn off all devices an hour before bedtime.  -Teas that can benefit: chamomile, valerian root, Brahmi (Bacopa) -Can consider over the counter melatonin, magnesium, and/or L-theanine. Melatonin is an anti-oxidant with multiple health benefits. Magnesium is involved in greater than 300 enzymatic reactions in the body and most of [place] are deficient as our soil is often depleted. There are 7 different types of magnesium- Bioptemizer's is a supplement with all 7 types, and each has unique benefits. Magnesium can also help with constipation and anxiety.  -Pistachios naturally increase the production of melatonin -Cozy Earth bamboo bed sheets are free from toxic chemicals.  -Tart cherry juice or a tart cherry supplement can improve sleep and  soreness post-workout  5) Numbness and tingling in hands -discussed that it is hard to bend them when she sleeps -prescribed metformin 500mg  daily -B6 supplement recommended   40 minutes spent in discussion of risks and benefits of Qutenza and obtaining informed consent, discussion of q90 day follow-up and expectation of improvement in pain with each repeat application, discussing increasing topamax to 100mg  to help with pain, insomnia, and weight loss, discussed her response to metformin, her 2 lb weight loss, that the level of pain in her feet today is 6 and it is worse than usual since she had physical therapy today, discussed that any pain that she feels from the Qutenza treatment may last up to 24 hours

## 2022-09-23 ENCOUNTER — Ambulatory Visit: Payer: Medicaid Other | Admitting: Physical Therapy

## 2022-09-24 ENCOUNTER — Ambulatory Visit
Admission: RE | Admit: 2022-09-24 | Discharge: 2022-09-24 | Disposition: A | Discharge status: Home / Self Care | Payer: Medicare Other | Source: Ambulatory Visit | Attending: Family Medicine | Admitting: Family Medicine

## 2022-09-24 DIAGNOSIS — Z1231 Encounter for screening mammogram for malignant neoplasm of breast: Secondary | ICD-10-CM | POA: Diagnosis not present

## 2022-09-25 ENCOUNTER — Ambulatory Visit: Payer: Medicaid Other

## 2022-09-25 ENCOUNTER — Ambulatory Visit (INDEPENDENT_AMBULATORY_CARE_PROVIDER_SITE_OTHER): Payer: Medicaid Other

## 2022-09-25 DIAGNOSIS — E538 Deficiency of other specified B group vitamins: Secondary | ICD-10-CM | POA: Diagnosis not present

## 2022-09-25 MED ORDER — CYANOCOBALAMIN 1000 MCG/ML IJ SOLN
1000.0000 ug | Freq: Once | INTRAMUSCULAR | Status: AC
Start: 2022-09-25 — End: 2022-09-25
  Administered 2022-09-25: 1000 ug via INTRAMUSCULAR
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
## 2022-09-30 ENCOUNTER — Ambulatory Visit: Payer: Medicaid Other

## 2022-10-01 ENCOUNTER — Telehealth: Payer: Medicaid Other | Admitting: Psychiatry

## 2022-10-01 ENCOUNTER — Ambulatory Visit: Payer: Medicare Other

## 2022-10-01 DIAGNOSIS — M6281 Muscle weakness (generalized): Secondary | ICD-10-CM

## 2022-10-01 DIAGNOSIS — R2681 Unsteadiness on feet: Secondary | ICD-10-CM | POA: Diagnosis not present

## 2022-10-01 DIAGNOSIS — R29818 Other symptoms and signs involving the nervous system: Secondary | ICD-10-CM | POA: Diagnosis not present

## 2022-10-01 NOTE — Therapy (Signed)
OUTPATIENT PHYSICAL THERAPY NEURO TREATMENT   Patient Name: Catherine Berg MRN: [id number] DOB:09/18/1980, 42 y.o., female Today's Date: 10/01/2022   PCP: [name], MD  REFERRING PROVIDER: Horton Chin, MD  END OF SESSION:  PT End of Session - 10/01/22 1521     Visit Number 5    Number of Visits 13    Date for PT Re-Evaluation 09/29/22    Authorization Type [employer]    Authorization Time Period approved 6 PT visits from 08/19/2022-10/17/2022    Authorization - Visit Number 5    Authorization - Number of Visits 6   combined   PT Start Time 1520    PT Stop Time 1600    PT Time Calculation (min) 40 min    Equipment Utilized During Treatment Gait belt    Activity Tolerance Patient tolerated treatment well    Behavior During Therapy [facility] for tasks assessed/performed              Past Medical History:  Diagnosis Date   Abnormal thyroid blood test    Anemia    Anxiety    Asthma    Complication of anesthesia    itching after c sections   COVID-19 01/2020   Depression    Elevated serum glutamic pyruvic transaminase (SGPT) level    Graves disease    History of abnormal cervical Pap smear    History of cervical polypectomy    Hives 09/02/2015   Hypertension    Hypothyroidism    IFG (impaired fasting glucose)    Incidental lung nodule, > 3mm and < 8mm 03/31/2017   4 mm RLL lung nodule on chest CT Mar 31, 2017   Low serum vitamin D    Obesity    PONV (postoperative nausea and vomiting)    after c section had vomit   Pregnancy induced hypertension    with 1st pregnancy, normal pressure with 2nd   Reflux    Thyroid disease    Vitamin B12 deficiency    Vitamin D deficiency disease    Wears dentures    full upper   Past Surgical History:  Procedure Laterality Date   CERVICAL POLYPECTOMY     CESAREAN SECTION     x 2   COLONOSCOPY WITH PROPOFOL N/A 11/14/2018   Procedure: COLONOSCOPY WITH PROPOFOL;  Surgeon: Pasty Spillers, MD;   Location: [facility];  Service: Endoscopy;  Laterality: N/A;   ESOPHAGOGASTRODUODENOSCOPY (EGD) WITH PROPOFOL N/A 11/14/2018   Procedure: ESOPHAGOGASTRODUODENOSCOPY (EGD) WITH PROPOFOL;  Surgeon: Pasty Spillers, MD;  Location: [facility];  Service: Endoscopy;  Laterality: N/A;  Latex allergy   GIVENS CAPSULE STUDY N/A 12/27/2018   Procedure: GIVENS CAPSULE STUDY;  Surgeon: Pasty Spillers, MD;  Location: [facility] ENDOSCOPY;  Service: Endoscopy;  Laterality: N/A;   Patient Active Problem List   Diagnosis Date Noted   MDD (major depressive disorder), recurrent episode, mild (HCC) 11/17/2021   Neuropathy involving both lower extremities 11/17/2021   Perennial allergic rhinitis with seasonal variation 11/17/2021   Chronic pain of both lower extremities 09/24/2020   Sleeps in sitting position due to orthopnea 07/02/2020   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   COVID-19 long hauler 07/02/2020   Vitamin D deficiency 04/22/2020   COVID-19 long hauler manifesting chronic fatigue 04/22/2020   History of COVID-19 02/28/2020   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   Memory loss 02/28/2020   Physical deconditioning 02/28/2020   Dyspnea on exertion 02/28/2020   Neck pain, bilateral 02/28/2020  Muscle spasms of neck 02/28/2020   Arthralgia of hand 01/24/2019   CRP elevated 01/24/2019   ESR raised 01/24/2019   Class 3 severe obesity due to excess calories without serious comorbidity in adult [facility]) 01/24/2019   Palpitations 09/16/2018   Coronary artery calcification 09/16/2018   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   Bilateral leg edema 04/20/2017   Incidental lung nodule, > 3mm and < 8mm 03/31/2017   Hives 09/02/2015   Morbid obesity (HCC) 08/15/2015   Anxiety 07/09/2015   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 06/28/2015   Thyroid nodule 02/01/2015   Vitamin D deficiency disease    Vitamin B12 deficiency    History of abnormal cervical Pap smear    Allergy-induced asthma, mild  intermittent, uncomplicated 10/09/2013    ONSET DATE: 2021  REFERRING DIAG: G60.9 (ICD-10-CM) - Idiopathic peripheral neuropathy  THERAPY DIAG:  Muscle weakness (generalized)  Unsteadiness on feet  Other symptoms and signs involving the nervous system  Rationale for Evaluation and Treatment: Rehabilitation  SUBJECTIVE:                                                                                                                                                                                             SUBJECTIVE STATEMENT:  Pt reports nothing new is going on.  Pt denies any new complaints as well.  Pt accompanied by: self  PERTINENT HISTORY: Anemia, anxiety, asthma, depression, HTN, long COVID  PAIN:  Are you having pain? Yes: NPRS scale: 0/10 Pain location:   Pain description: sharp, burning, just there Aggravating factors: night Relieving factors: meds  PRECAUTIONS: Fall  WEIGHT BEARING RESTRICTIONS: No  FALLS: Has patient fallen in last 6 months? No  LIVING ENVIRONMENT: Lives with:  kids Lives in: House/apartment Stairs:  3 steps to enter without handrail and 6 for the back door with handrail; 1 story home  Has following equipment at home: None  PLOF: Independent;  was working full time and walking ~4 miles a day prior to 2021; getting long term disability since 2021  PATIENT GOALS: would like to be pain free and get back to my routine   OBJECTIVE:   TODAY'S TREATMENT:  10/01/22   TherEx:  Standing toe raises with UE support, 2x10 Standing heel raises with UE support, 2x10 Standing mini squats with UE support, x10 Squats on total gym,  2x10 at highest level Seated leg press at OMEGA, 75#, 2x10 Seated leg extensions at OMEGA, 35#, 2x10 Seated hamstring curls at OMEGA, 35#, 2x10 Standing hip abduction at Matrix machine, 55#, x10 each LE Standing hip extension at Matrix machine, 55#, x10 each LE Standing hip flexion (marches) at  Matrix machine, 55#,  x10 each LE Standing hip adduction at Matrix machine, 55#, x10 each LE    HOME EXERCISE PROGRAM Last updated: 09/08/22 Access Code: [email or web address] URL: [email or web address] Date: 09/07/2022 Prepared by: [facility] - [facility]  Exercises - Sit to Stand with Arms Crossed  - 1 x daily - 5 x weekly - 2 sets - 10 reps - Gastroc Stretch on Wall  - 1 x daily - 5 x weekly - 2 sets - 30 sec hold - Standing Hip Abduction with Counter Support  - 1 x daily - 5 x weekly - 2 sets - 10 reps - Romberg Stance  - 1 x daily - 5 x weekly - 2 sets - 30 sec hold - Romberg Stance with Eyes Closed  - 1 x daily - 5 x weekly - 2 sets - 30 sec hold - Standing Single Leg Stance with Counter Support  - 1 x daily - 7 x weekly - 3 sets - 10 sec hold - Standing Knee Flexion AROM with Chair Support  - 1 x daily - 7 x weekly - 3 sets - 10 reps - Seated Table Hamstring Stretch  - 1 x daily - 7 x weekly - 3 sets - 60 sec (or 10 deep breaths) hold - Carioca with Counter Support  - 1 x daily - 5 x weekly - 2 sets - 10 reps   PATIENT EDUCATION: Education details: HEP update Person educated: Patient Education method: Explanation, Demonstration, Tactile cues, Verbal cues, and Handouts Education comprehension: verbalized understanding and returned demonstration     Below measures were taken at time of initial evaluation unless otherwise specified:  DIAGNOSTIC FINDINGS: none recent  COGNITION: Overall cognitive status: Within functional limits for tasks assessed   SENSATION: WFL to light touch   COORDINATION: Alternating pronation/supination: slightly decreased pace Alternating toe tap: difficulty coordinating L>R LEs Finger to nose: intact B   POSTURE: No Significant postural limitations  LOWER EXTREMITY ROM:     Active  Right Eval Left Eval  Hip flexion    Hip extension    Hip abduction    Hip adduction    Hip internal rotation    Hip external rotation    Knee  flexion    Knee extension    Ankle dorsiflexion 0 -10  Ankle plantarflexion    Ankle inversion    Ankle eversion     (Blank rows = not tested)  LOWER EXTREMITY MMT:    MMT (in sitting) Right Eval Left Eval  Hip flexion 4- 4-  Hip extension    Hip abduction 4- 4  Hip adduction 4- 4-  Hip internal rotation    Hip external rotation    Knee flexion 4- 4-  Knee extension 4 4-  Ankle dorsiflexion 4 4+  Ankle plantarflexion 4- 4-  Ankle inversion    Ankle eversion    (Blank rows = not tested)   GAIT: Gait pattern: slow gait pattern with limited B foot clearance, occasionally scuffing feet Assistive device utilized: None Level of assistance: Modified independence   FUNCTIONAL TESTS:     GOALS: Goals reviewed with patient? Yes  SHORT TERM GOALS: Target date: 09/01/2022  Patient to be independent with initial HEP. Baseline: HEP initiated Goal status: MET    LONG TERM GOALS: Target date: 09/29/2022  Patient to be independent with advanced HEP. Baseline: Not yet initiated  Goal status: IN PROGRESS  Patient to demonstrate B LE strength >/=4/5.  Baseline:  See above Goal status: IN PROGRESS  Patient to demonstrate at least 5 degrees of L ankle dorsiflexion AROM in order to improve safety with gait. Baseline: L -10 deg Goal status: IN PROGRESS  Patient to demonstrate alternating reciprocal pattern when ascending and descending stairs with good stability without handrail.    Baseline: limited eccentric control descending Goal status: IN PROGRESS  Patient to score at least 20/24 on DGI in order to decrease risk of falls.  Baseline: 18 Goal status: IN PROGRESS  Patient to demonstrate 5xSTS test in <13 sec in order to improve muscle power with transfers. Baseline: 15.91 sec without Ues  Goal status: IN PROGRESS  Patient to report plans to start home or community fitness regimen with modifications as needed.  Baseline: no plans Goal status: IN  PROGRESS   ASSESSMENT:  CLINICAL IMPRESSION:  Pt responded well to the newly introduced resistance exercises and required less frequent rest breaks than in prior session.  Pt is demonstrating improved muscular strength and mobility of the LE's.  Pt has one available appointment left and will be d/c at that time due to visit restrictions.  Pt will continue to improve strength and would benefit from updated HEP at next visit to d/c with.   Pt will continue to benefit from skilled therapy to address remaining deficits in order to improve overall QoL and return to PLOF.       OBJECTIVE IMPAIRMENTS: Abnormal gait, decreased activity tolerance, decreased balance, decreased coordination, decreased endurance, difficulty walking, decreased ROM, decreased strength, impaired flexibility, and pain.   ACTIVITY LIMITATIONS: carrying, lifting, bending, standing, squatting, sleeping, stairs, transfers, bathing, toileting, dressing, hygiene/grooming, and caring for others  PARTICIPATION LIMITATIONS: meal prep, cleaning, laundry, driving, shopping, community activity, yard work, and church  PERSONAL FACTORS: Age, Past/current experiences, Time since onset of injury/illness/exacerbation, and 3+ comorbidities: Anemia, anxiety, asthma, depression, HTN  are also affecting patient's functional outcome.   REHAB POTENTIAL: Good  CLINICAL DECISION MAKING: Evolving/moderate complexity  EVALUATION COMPLEXITY: Moderate  PLAN:  PT FREQUENCY: 1-2x/week  PT DURATION: 6 weeks  PLANNED INTERVENTIONS: Therapeutic exercises, Therapeutic activity, Neuromuscular re-education, Balance training, Gait training, Patient/Family education, Self Care, Joint mobilization, Stair training, Vestibular training, Canalith repositioning, Aquatic Therapy, Dry Needling, Electrical stimulation, Cryotherapy, Moist heat, Taping, Manual therapy, and Re-evaluation  PLAN FOR NEXT SESSION: review HEP; progress B LE strength, B ankle  dorsiflexion ROM, test multisensory balance     Nolon Bussing, PT, DPT Physical Therapist - [facility]  [facility]  10/01/22, 5:05 PM

## 2022-10-05 ENCOUNTER — Ambulatory Visit: Payer: Medicaid Other

## 2022-10-07 ENCOUNTER — Ambulatory Visit: Payer: Medicaid Other | Attending: Internal Medicine | Admitting: Cardiology

## 2022-10-07 ENCOUNTER — Encounter: Payer: Self-pay | Admitting: Cardiology

## 2022-10-19 ENCOUNTER — Encounter: Payer: Self-pay | Admitting: Family Medicine

## 2022-10-19 ENCOUNTER — Ambulatory Visit: Payer: Medicare Other | Admitting: Physician Assistant

## 2022-10-19 ENCOUNTER — Encounter: Payer: Self-pay | Admitting: Physician Assistant

## 2022-10-19 VITALS — BP 140/86 | HR 73 | Temp 98.2°F | Ht 62.0 in | Wt 236.4 lb

## 2022-10-19 DIAGNOSIS — K580 Irritable bowel syndrome with diarrhea: Secondary | ICD-10-CM | POA: Diagnosis not present

## 2022-10-19 DIAGNOSIS — R1084 Generalized abdominal pain: Secondary | ICD-10-CM | POA: Diagnosis not present

## 2022-10-19 MED ORDER — DICYCLOMINE HCL 10 MG PO CAPS
10.0000 mg | ORAL_CAPSULE | Freq: Three times a day (TID) | ORAL | 2 refills | Status: AC
Start: 2022-10-19 — End: 2023-01-17

## 2022-10-19 NOTE — Progress Notes (Signed)
Celso Amy, PA-C [address]  [address]  [place], [place] [address]  Main: [phone]  Fax: [phone]   Gastroenterology Consultation  Referring Provider:     [name], MD Primary Care Physician:  [name], MD Primary Gastroenterologist:  Celso Amy, PA-C / Dr. Wyline Mood  Reason for Consultation:     Stomach pain, previous cholecystectomy        HPI:   Catherine Berg is a 42 y.o. y/o female referred for consultation & management  by [name], MD.    Previous pt. Of Dr. Maximino Greenland.  Last seen here in 04/2019.  She had evaluation for iron deficiency anemia in 2020 which included EGD, colonoscopy, and capsule endoscopy.  All unrevealing for sources of anemia.  EGD 11/2018 - Normal Colonoscopy 11/2018 -good prep, no polyps, mild thickened fold in the cecum, normal biopsies. Capsule endoscopy 12/2018 -nonspecific red spots seen with no active bleeding or lesions.  CT Abd / Pelvis 01/2020 - Showed a few small gallstones.  Otherwise no abnormality.  RUQ [place] 09/2020 -showed multiple gallstones.  No liver lesions.  She underwent cholecystectomy 10/2020.  Current symptoms: She has generalized periumbilical diffuse mid abdominal cramping which comes and goes for many years.  It is worse after eating and leaves after bowel movement.  She has occasional loose stools and occasional constipation.  She denies unintentional weight loss, rectal bleeding, or any alarm symptoms.  Has occasional heartburn and nausea but no vomiting or dysphagia.  Labs 05/2022 showed normal CBC with hemoglobin 12.3.  No recent anemia.  Labs 06/2022 showed normal ferritin and B12.  Past Medical History:  Diagnosis Date   Abnormal thyroid blood test    Anemia    Anxiety    Asthma    Complication of anesthesia    itching after c sections   COVID-19 01/2020   Depression    Elevated serum glutamic pyruvic transaminase (SGPT) level    Graves disease    History of abnormal cervical Pap  smear    History of cervical polypectomy    Hives 09/02/2015   Hypertension    Hypothyroidism    IFG (impaired fasting glucose)    Incidental lung nodule, > 3mm and < 8mm 03/31/2017   4 mm RLL lung nodule on chest CT Mar 31, 2017   Low serum vitamin D    Obesity    PONV (postoperative nausea and vomiting)    after c section had vomit   Pregnancy induced hypertension    with 1st pregnancy, normal pressure with 2nd   Reflux    Thyroid disease    Vitamin B12 deficiency    Vitamin D deficiency disease    Wears dentures    full upper    Past Surgical History:  Procedure Laterality Date   CERVICAL POLYPECTOMY     CESAREAN SECTION     x 2   COLONOSCOPY WITH PROPOFOL N/A 11/14/2018   Procedure: COLONOSCOPY WITH PROPOFOL;  Surgeon: Pasty Spillers, MD;  Location: [facility];  Service: Endoscopy;  Laterality: N/A;   ESOPHAGOGASTRODUODENOSCOPY (EGD) WITH PROPOFOL N/A 11/14/2018   Procedure: ESOPHAGOGASTRODUODENOSCOPY (EGD) WITH PROPOFOL;  Surgeon: Pasty Spillers, MD;  Location: [facility];  Service: Endoscopy;  Laterality: N/A;  Latex allergy   GIVENS CAPSULE STUDY N/A 12/27/2018   Procedure: GIVENS CAPSULE STUDY;  Surgeon: Pasty Spillers, MD;  Location: [facility] ENDOSCOPY;  Service: Endoscopy;  Laterality: N/A;    Prior to Admission medications  Medication Sig Start Date End Date Taking? Authorizing Provider  amLODipine (NORVASC) 5 MG tablet Take 1 tablet (5 mg total) by mouth daily. 08/26/22   [name], MD  Azelastine HCl 137 MCG/SPRAY SOLN SPRAY TWO SPRAYS IN EACH NOSTRIL TWICE DAILY. 05/19/21   [name], MD  buPROPion (WELLBUTRIN XL) 150 MG 24 hr tablet 150 mg daily for one day, then 300 mg daily for one day, then 450 mg daily 07/10/21   [name], MD  Cyanocobalamin (B-12) 1000 MCG SUBL Place 1 tablet under the tongue daily. 11/17/21   [name], MD  DULoxetine (CYMBALTA) 30 MG capsule Take 1 capsule (30 mg total) by mouth daily. 30 mg  daily for one week, then 60 mg daily 08/26/22 09/25/22  [name], MD  DULoxetine (CYMBALTA) 60 MG capsule Take 1 capsule (60 mg total) by mouth daily. Start after completing 30 mg daily for one week 09/02/22 12/01/22  [name], MD  etonogestrel (NEXPLANON) 68 MG IMPL implant Inject 68 mg into the skin once.    [provider]  levothyroxine (SYNTHROID) 88 MCG tablet Take 88 mcg by mouth daily before breakfast.    Sherlon Handing, MD  metFORMIN (GLUCOPHAGE) 500 MG tablet Take 1 tablet (500 mg total) by mouth daily with breakfast. 08/11/22   Raulkar, Drema Pry, MD  mometasone-formoterol (DULERA) 200-5 MCG/ACT AERO Inhale 2 puffs into the lungs in the morning and at bedtime. 05/12/22   Parrett, Virgel Bouquet, NP  montelukast (SINGULAIR) 10 MG tablet Take 1 tablet (10 mg total) by mouth at bedtime. 05/12/22   Parrett, Virgel Bouquet, NP  topiramate (TOPAMAX) 100 MG tablet Take 1 tablet (100 mg total) by mouth at bedtime. 09/22/22   Raulkar, Drema Pry, MD  VENTOLIN HFA 108 (90 Base) MCG/ACT inhaler INHALE 2 PUFFS BY MOUTH EVERY 6 HOURS AS NEEDED FOR WHEEZING OR SHORTNESS OF BREATH 07/30/22   Erin Fulling, MD  Vitamin D, Ergocalciferol, (DRISDOL) 1.25 MG (50000 UNIT) CAPS capsule Take 1 capsule (50,000 Units total) by mouth every 7 (seven) days. 08/26/22   [name], MD    Family History  Problem Relation Age of Onset   Heart attack Mother 57   Hypertension Mother    Asthma Mother    Cancer Mother        laryngeal   Diabetes Mother        pre diabetic   Heart disease Mother    Mental illness Mother    Alcohol abuse Mother    Drug abuse Mother    Depression Mother    Anxiety disorder Mother    Bipolar disorder Mother    Learning disabilities Brother    ADD / ADHD Brother    Asthma Son    Hyperlipidemia Brother    Alcohol abuse Father    Heart disease Maternal Grandmother        heart attack, pacemaker   Heart attack Maternal Grandmother    Hypertension Maternal Grandmother     Hypertension Maternal Grandfather    Heart disease Paternal Grandmother        couple of major open heart surgeries, leaking valves   Hypertension Paternal Grandmother    Hypertension Paternal Grandfather    Breast cancer Neg Hx      Social History   Tobacco Use   Smoking status: Never   Smokeless tobacco: Never  Vaping Use   Vaping Use: Never used  Substance Use Topics   Alcohol use: No    Alcohol/week: 0.0 standard  drinks of alcohol   Drug use: No    Allergies as of 10/19/2022 - Review Complete 09/07/2022  Allergen Reaction Noted   Latex Hives 07/30/2014   Shellfish allergy Hives 07/30/2014    Review of Systems:    All systems reviewed and negative except where noted in HPI.   Physical Exam:  There were no vitals taken for this visit. No LMP recorded. Patient has had an implant. Psych:  Alert and cooperative. Normal mood and affect. General:   Alert,  Well-developed, well-nourished, pleasant and cooperative in NAD Head:  Normocephalic and atraumatic. Eyes:  Sclera clear, no icterus.   Conjunctiva pink. Neck:  Supple; no masses or thyromegaly. Lungs:  Respirations even and unlabored.  Clear throughout to auscultation.   No wheezes, crackles, or rhonchi. No acute distress. Heart:  Regular rate and rhythm; no murmurs, clicks, rubs, or gallops. Abdomen:  Normal bowel sounds.  No bruits.  Soft, and non-distended without masses, hepatosplenomegaly or hernias noted.  No Tenderness.  No guarding or rebound tenderness.    Neurologic:  Alert and oriented x3;  grossly normal neurologically. Psych:  Alert and cooperative. Normal mood and affect.  Imaging Studies: MM 3D SCREEN BREAST BILATERAL  Result Date: 09/29/2022 CLINICAL DATA:  Screening. EXAM: DIGITAL SCREENING BILATERAL MAMMOGRAM WITH TOMOSYNTHESIS AND CAD TECHNIQUE: Bilateral screening digital craniocaudal and mediolateral oblique mammograms were obtained. Bilateral screening digital breast tomosynthesis was performed.  The images were evaluated with computer-aided detection. COMPARISON:  Previous exam(s). ACR Breast Density Category a: The breasts are almost entirely fatty. FINDINGS: There are no findings suspicious for malignancy. IMPRESSION: No mammographic evidence of malignancy. A result letter of this screening mammogram will be mailed directly to the patient. RECOMMENDATION: Screening mammogram in one year. (Code:SM-B-01Y) BI-RADS CATEGORY  1: Negative. Electronically Signed   By: Frederico Hamman M.D.   On: 09/29/2022 08:24    Assessment and Plan:   Catherine Berg is a 42 y.o. y/o female has been referred for generalized abdominal cramping, irregular bowel habits, and acid reflux.  Symptoms are most consistent with irritable bowel syndrome and acid reflux.  She has no alarm symptoms.  I am giving trial of treatment with follow-up.  IBS-D  Rx: Dicyclomine 10mg  TID before meals.  Low FODMAP diet.  Patient education given and discussed.  GERD Continue Omeprazole. GERD Diet. Recommend Lifestyle Modifications to prevent Acid Reflux.  Rec. Avoid coffee, sodas, peppermint, citrus fruits, and spicey foods.  Avoid eating 2-3 hours before bedtime.   Follow up in 4 weeks with TG.  Celso Amy, PA-C

## 2022-10-19 NOTE — Progress Notes (Unsigned)
Name: Catherine Berg   MRN: [id number]    DOB: 26-Dec-1980   Date:10/20/2022       Progress Note  Subjective  Chief Complaint  Breast Lump  HPI  Breast lump: she is up to date with mammogram - normal and done 09/2022 . 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   COVID-19 long hauler 07/02/2020   Vitamin D deficiency 04/22/2020   COVID-19 long hauler manifesting chronic fatigue 04/22/2020   History of COVID-19 02/28/2020   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   Memory loss 02/28/2020   Physical deconditioning 02/28/2020   Dyspnea on exertion 02/28/2020   Neck pain, bilateral 02/28/2020   Muscle spasms of neck 02/28/2020   Arthralgia of hand 01/24/2019   CRP elevated 01/24/2019   ESR raised 01/24/2019   Class 3 severe obesity due to excess calories without serious comorbidity in adult [facility]) 01/24/2019   Palpitations 09/16/2018   Coronary artery calcification 09/16/2018   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   Bilateral leg edema 04/20/2017   Incidental lung nodule, > 3mm and < 8mm 03/31/2017   Hives 09/02/2015   Morbid obesity (HCC) 08/15/2015   Anxiety 07/09/2015   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 06/28/2015   Thyroid nodule 02/01/2015   Vitamin D deficiency disease    Vitamin B12 deficiency    History of abnormal cervical Pap smear    Allergy-induced asthma, mild intermittent, uncomplicated 10/09/2013    Past Surgical History:  Procedure Laterality Date   CERVICAL  POLYPECTOMY     CESAREAN SECTION     x 2   COLONOSCOPY WITH PROPOFOL N/A 11/14/2018   Procedure: COLONOSCOPY WITH PROPOFOL;  Surgeon: Pasty Spillers, MD;  Location: [facility];  Service: Endoscopy;  Laterality: N/A;   ESOPHAGOGASTRODUODENOSCOPY (EGD) WITH PROPOFOL N/A 11/14/2018   Procedure: ESOPHAGOGASTRODUODENOSCOPY (EGD) WITH PROPOFOL;  Surgeon: Pasty Spillers, MD;  Location: [facility];  Service: Endoscopy;  Laterality: N/A;  Latex allergy   GIVENS CAPSULE STUDY N/A 12/27/2018   Procedure: GIVENS CAPSULE STUDY;  Surgeon: Pasty Spillers, MD;  Location: [facility];  Service: Endoscopy;  Laterality: N/A;    Family History  Problem Relation Age of Onset   Heart attack Mother 5   Hypertension Mother    Asthma Mother    Cancer Mother        laryngeal   Diabetes Mother        pre diabetic   Heart disease Mother    Mental illness Mother    Alcohol abuse Mother    Drug abuse Mother    Depression Mother    Anxiety disorder Mother    Bipolar disorder Mother    Learning disabilities Brother    ADD / ADHD Brother    Asthma Son    Hyperlipidemia Brother    Alcohol abuse Father    Heart disease Maternal Grandmother        heart attack, pacemaker   Heart attack Maternal Grandmother    Hypertension Maternal Grandmother  Hypertension Maternal Grandfather    Heart disease Paternal Grandmother        couple of major open heart surgeries, leaking valves   Hypertension Paternal Grandmother    Hypertension Paternal Grandfather    Breast cancer Neg Hx     Social History   Tobacco Use   Smoking status: Never   Smokeless tobacco: Never  Substance Use Topics   Alcohol use: No    Alcohol/week: 0.0 standard drinks of alcohol     Current Outpatient Medications:    amLODipine (NORVASC) 5 MG tablet, Take 1 tablet (5 mg total) by mouth daily., Disp: 90 tablet, Rfl: 1   Azelastine HCl 137 MCG/SPRAY SOLN, SPRAY TWO SPRAYS IN EACH NOSTRIL TWICE DAILY.,  Disp: 30 mL, Rfl: 1   buPROPion (WELLBUTRIN XL) 150 MG 24 hr tablet, 150 mg daily for one day, then 300 mg daily for one day, then 450 mg daily, Disp: 90 tablet, Rfl: 1   Cyanocobalamin (B-12) 1000 MCG SUBL, Place 1 tablet under the tongue daily., Disp: 90 tablet, Rfl: 1   dicyclomine (BENTYL) 10 MG capsule, Take 1 capsule (10 mg total) by mouth 4 (four) times daily -  before meals and at bedtime., Disp: 90 capsule, Rfl: 2   DULoxetine (CYMBALTA) 60 MG capsule, Take 1 capsule (60 mg total) by mouth daily. Start after completing 30 mg daily for one week, Disp: 30 capsule, Rfl: 2   etonogestrel (NEXPLANON) 68 MG IMPL implant, Inject 68 mg into the skin once., Disp: , Rfl:    levothyroxine (SYNTHROID) 88 MCG tablet, Take 88 mcg by mouth daily before breakfast., Disp: , Rfl:    metFORMIN (GLUCOPHAGE) 500 MG tablet, Take 1 tablet (500 mg total) by mouth daily with breakfast., Disp: 90 tablet, Rfl: 3   mometasone-formoterol (DULERA) 200-5 MCG/ACT AERO, Inhale 2 puffs into the lungs in the morning and at bedtime., Disp: 1 each, Rfl: 11   montelukast (SINGULAIR) 10 MG tablet, Take 1 tablet (10 mg total) by mouth at bedtime., Disp: 90 tablet, Rfl: 1   topiramate (TOPAMAX) 100 MG tablet, Take 1 tablet (100 mg total) by mouth at bedtime., Disp: 90 tablet, Rfl: 3   VENTOLIN HFA 108 (90 Base) MCG/ACT inhaler, INHALE 2 PUFFS BY MOUTH EVERY 6 HOURS AS NEEDED FOR WHEEZING OR SHORTNESS OF BREATH, Disp: 18 g, Rfl: 1   Vitamin D, Ergocalciferol, (DRISDOL) 1.25 MG (50000 UNIT) CAPS capsule, Take 1 capsule (50,000 Units total) by mouth every 7 (seven) days., Disp: 12 capsule, Rfl: 1   DULoxetine (CYMBALTA) 30 MG capsule, Take 1 capsule (30 mg total) by mouth daily. 30 mg daily for one week, then 60 mg daily, Disp: 30 capsule, Rfl: 0  Allergies  Allergen Reactions   Latex Hives    (Balloons, condoms, underwear elastic, gloves)   Shellfish Allergy Hives    I personally reviewed active problem list, medication list,  allergies, family history, social history, health maintenance with the patient/caregiver today.   ROS  Ten systems reviewed and is negative except as mentioned in HPI   Objective  Vitals:   10/20/22 1402  BP: 120/76  Pulse: 90  Resp: 18  Temp: 97.9 F (36.6 C)  TempSrc: Oral  SpO2: 99%  Weight: 235 lb 14.4 oz (107 kg)  Height: 5\' 3"  (1.6 m)    Body mass index is 41.79 kg/m.  Physical Exam  Constitutional: Patient appears well-developed and well-nourished. Obese  No distress.  HEENT: head atraumatic, normocephalic, pupils equal and reactive to light,  neck supple Cardiovascular: Normal rate, regular rhythm and normal heart sounds.  No murmur heard. No BLE edema. Pulmonary/Chest: Effort normal and breath sounds normal. No respiratory distress. Abdominal: Soft.  There is no tenderness. Breast: large breasts, pendulous, skin intact, no nipple discharge, pea size - subcutaneous at 10 o'clock right side, lumpy on left side  Psychiatric: Patient has a normal mood and affect. behavior is normal. Judgment and thought content normal.    PHQ2/9:    10/20/2022    2:04 PM 08/26/2022    8:00 AM 06/17/2022    9:27 AM 05/25/2022    9:13 AM 02/03/2022    1:18 PM  Depression screen PHQ 2/9  Decreased Interest 2 3 2 2 3   Down, Depressed, Hopeless 1 2 1 1 3   PHQ - 2 Score 3 5 3 3 6   Altered sleeping 2 3 3 2    Tired, decreased energy 1 3 3 2    Change in appetite 0 0 0 0   Feeling bad or failure about yourself  0 0 0 0   Trouble concentrating 1 2 0 0   Moving slowly or fidgety/restless 0 0 0 0   Suicidal thoughts 0 0 0 0   PHQ-9 Score 7 13 9 7    Difficult doing work/chores Somewhat difficult   Somewhat difficult     phq 9 is positive   Fall Risk:    10/20/2022    2:03 PM 09/22/2022   11:18 AM 08/26/2022    8:00 AM 08/11/2022    1:19 PM 06/17/2022    9:27 AM  Fall Risk   Falls in the past year? 1 0 1 0 1  Number falls in past yr: 1  0  0  Injury with Fall? 0  0  0  Risk for fall  due to : History of fall(s)  No Fall Risks  No Fall Risks  Follow up Falls prevention discussed;Education provided;Falls evaluation completed  Falls prevention discussed  Falls prevention discussed      Functional Status Survey: Is the patient deaf or have difficulty hearing?: No Does the patient have difficulty seeing, even when wearing glasses/contacts?: Yes Does the patient have difficulty concentrating, remembering, or making decisions?: Yes Does the patient have difficulty walking or climbing stairs?: No Does the patient have difficulty dressing or bathing?: No Does the patient have difficulty doing errands alone such as visiting a doctor's office or shopping?: No    Assessment & Plan  1. Breast lump in female  - [place] LIMITED ULTRASOUND INCLUDING AXILLA RIGHT BREAST; Future

## 2022-10-20 ENCOUNTER — Encounter: Payer: Self-pay | Admitting: Family Medicine

## 2022-10-20 ENCOUNTER — Other Ambulatory Visit: Payer: Self-pay | Admitting: Family Medicine

## 2022-10-20 ENCOUNTER — Ambulatory Visit: Payer: Medicaid Other | Admitting: Family Medicine

## 2022-10-20 VITALS — BP 120/76 | HR 90 | Temp 97.9°F | Resp 18 | Ht 63.0 in | Wt 235.9 lb

## 2022-10-20 DIAGNOSIS — N63 Unspecified lump in unspecified breast: Secondary | ICD-10-CM

## 2022-10-27 ENCOUNTER — Other Ambulatory Visit: Payer: Medicaid Other

## 2022-10-27 ENCOUNTER — Ambulatory Visit
Admission: RE | Admit: 2022-10-27 | Discharge: 2022-10-27 | Disposition: A | Discharge status: Home / Self Care | Payer: Medicare Other | Source: Ambulatory Visit | Attending: Family Medicine | Admitting: Family Medicine

## 2022-10-27 DIAGNOSIS — N641 Fat necrosis of breast: Secondary | ICD-10-CM | POA: Diagnosis not present

## 2022-10-27 DIAGNOSIS — N63 Unspecified lump in unspecified breast: Secondary | ICD-10-CM | POA: Diagnosis present

## 2022-10-27 DIAGNOSIS — N6311 Unspecified lump in the right breast, upper outer quadrant: Secondary | ICD-10-CM | POA: Insufficient documentation
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
## 2022-11-02 ENCOUNTER — Ambulatory Visit (INDEPENDENT_AMBULATORY_CARE_PROVIDER_SITE_OTHER): Payer: Medicaid Other | Admitting: Psychiatry

## 2022-11-02 ENCOUNTER — Encounter: Payer: Self-pay | Admitting: Psychiatry

## 2022-11-02 VITALS — BP 143/93 | HR 82 | Temp 97.2°F | Ht 63.0 in | Wt 237.8 lb

## 2022-11-02 DIAGNOSIS — F4310 Post-traumatic stress disorder, unspecified: Secondary | ICD-10-CM | POA: Diagnosis not present

## 2022-11-02 DIAGNOSIS — F331 Major depressive disorder, recurrent, moderate: Secondary | ICD-10-CM | POA: Diagnosis not present

## 2022-11-02 DIAGNOSIS — G4700 Insomnia, unspecified: Secondary | ICD-10-CM | POA: Diagnosis not present

## 2022-11-02 DIAGNOSIS — F411 Generalized anxiety disorder: Secondary | ICD-10-CM | POA: Diagnosis not present

## 2022-11-02 MED ORDER — DULOXETINE HCL 60 MG PO CPEP: 60.0000 mg | ORAL_CAPSULE | Freq: Every day | ORAL | 4 refills | Status: DC

## 2022-11-02 MED ORDER — DULOXETINE HCL 30 MG PO CPEP: 30.0000 mg | ORAL_CAPSULE | Freq: Every day | ORAL | 1 refills | Status: DC

## 2022-11-02 MED ORDER — TRAZODONE HCL 50 MG PO TABS: 25.0000 mg | ORAL_TABLET | Freq: Every evening | ORAL | 1 refills | Status: DC | PRN

## 2022-11-04 ENCOUNTER — Telehealth: Payer: Medicaid Other | Admitting: Psychiatry

## 2022-11-09 ENCOUNTER — Other Ambulatory Visit: Payer: Self-pay

## 2022-11-09 ENCOUNTER — Ambulatory Visit: Payer: Medicaid Other | Admitting: Plastic Surgery

## 2022-11-09 ENCOUNTER — Encounter: Payer: Self-pay | Admitting: Plastic Surgery

## 2022-11-09 VITALS — BP 148/91 | HR 70 | Ht 63.0 in | Wt 237.6 lb

## 2022-11-09 DIAGNOSIS — M546 Pain in thoracic spine: Secondary | ICD-10-CM

## 2022-11-09 DIAGNOSIS — M542 Cervicalgia: Secondary | ICD-10-CM

## 2022-11-09 DIAGNOSIS — Z6841 Body Mass Index (BMI) 40.0 and over, adult: Secondary | ICD-10-CM | POA: Diagnosis not present

## 2022-11-09 DIAGNOSIS — N62 Hypertrophy of breast: Secondary | ICD-10-CM

## 2022-11-09 DIAGNOSIS — G8929 Other chronic pain: Secondary | ICD-10-CM

## 2022-11-09 NOTE — Progress Notes (Signed)
Referring Provider [name], MD [address] [place],  [place] [address]   CC:  Chief Complaint  Patient presents with   Advice Only      Catherine Berg is an 41 y.o. female.  HPI: Ms. Cockerill is a 42 year old female who presents today with a several years history of upper back and neck pain and shoulder grooving from her bra straps due to the large size of her breast.  The patient has been seen in the past for a breast reduction by another surgeon but was unable to schedule the procedure.  She presents today with a request for a bilateral breast reduction  Allergies  Allergen Reactions   Latex Hives    (Balloons, condoms, underwear elastic, gloves)   Shellfish Allergy Hives    Outpatient Encounter Medications as of 11/09/2022  Medication Sig   amLODipine (NORVASC) 5 MG tablet Take 1 tablet (5 mg total) by mouth daily.   Azelastine HCl 137 MCG/SPRAY SOLN SPRAY TWO SPRAYS IN EACH NOSTRIL TWICE DAILY.   Cyanocobalamin (B-12) 1000 MCG SUBL Place 1 tablet under the tongue daily.   dicyclomine (BENTYL) 10 MG capsule Take 1 capsule (10 mg total) by mouth 4 (four) times daily -  before meals and at bedtime.   DULoxetine (CYMBALTA) 30 MG capsule Take 1 capsule (30 mg total) by mouth daily. Take total of 90 mg daily. Take along with 60 mg cap   [START ON 12/01/2022] DULoxetine (CYMBALTA) 60 MG capsule Take 1 capsule (60 mg total) by mouth daily. Total of 90 mg daily. Take along with 30 mg cap   etonogestrel (NEXPLANON) 68 MG IMPL implant Inject 68 mg into the skin once.   levothyroxine (SYNTHROID) 88 MCG tablet Take 88 mcg by mouth daily before breakfast.   metFORMIN (GLUCOPHAGE) 500 MG tablet Take 1 tablet (500 mg total) by mouth daily with breakfast.   mometasone-formoterol (DULERA) 200-5 MCG/ACT AERO Inhale 2 puffs into the lungs in the morning and at bedtime.   montelukast (SINGULAIR) 10 MG tablet Take 1 tablet (10 mg total) by mouth at bedtime.   topiramate (TOPAMAX)  100 MG tablet Take 1 tablet (100 mg total) by mouth at bedtime.   traZODone (DESYREL) 50 MG tablet Take 0.5-1 tablets (25-50 mg total) by mouth at bedtime as needed for sleep.   VENTOLIN HFA 108 (90 Base) MCG/ACT inhaler INHALE 2 PUFFS BY MOUTH EVERY 6 HOURS AS NEEDED FOR WHEEZING OR SHORTNESS OF BREATH   Vitamin D, Ergocalciferol, (DRISDOL) 1.25 MG (50000 UNIT) CAPS capsule Take 1 capsule (50,000 Units total) by mouth every 7 (seven) days.   No facility-administered encounter medications on file as of 11/09/2022.     Past Medical History:  Diagnosis Date   Abnormal thyroid blood test    Anemia    Anxiety    Asthma    Complication of anesthesia    itching after c sections   COVID-19 01/2020   Depression    Elevated serum glutamic pyruvic transaminase (SGPT) level    Graves disease    History of abnormal cervical Pap smear    History of cervical polypectomy    Hives 09/02/2015   Hypertension    Hypothyroidism    IFG (impaired fasting glucose)    Incidental lung nodule, > 3mm and < 8mm 03/31/2017   4 mm RLL lung nodule on chest CT Mar 31, 2017   Low serum vitamin D    Obesity    PONV (postoperative nausea and  vomiting)    after c section had vomit   Pregnancy induced hypertension    with 1st pregnancy, normal pressure with 2nd   Reflux    Thyroid disease    Vitamin B12 deficiency    Vitamin D deficiency disease    Wears dentures    full upper    Past Surgical History:  Procedure Laterality Date   CERVICAL POLYPECTOMY     CESAREAN SECTION     x 2   COLONOSCOPY WITH PROPOFOL N/A 11/14/2018   Procedure: COLONOSCOPY WITH PROPOFOL;  Surgeon: Pasty Spillers, MD;  Location: [facility];  Service: Endoscopy;  Laterality: N/A;   ESOPHAGOGASTRODUODENOSCOPY (EGD) WITH PROPOFOL N/A 11/14/2018   Procedure: ESOPHAGOGASTRODUODENOSCOPY (EGD) WITH PROPOFOL;  Surgeon: Pasty Spillers, MD;  Location: [facility];  Service: Endoscopy;  Laterality: N/A;  Latex allergy    GIVENS CAPSULE STUDY N/A 12/27/2018   Procedure: GIVENS CAPSULE STUDY;  Surgeon: Pasty Spillers, MD;  Location: [facility];  Service: Endoscopy;  Laterality: N/A;    Family History  Problem Relation Age of Onset   Heart attack Mother 39   Hypertension Mother    Asthma Mother    Cancer Mother        laryngeal   Diabetes Mother        pre diabetic   Heart disease Mother    Mental illness Mother    Alcohol abuse Mother    Drug abuse Mother    Depression Mother    Anxiety disorder Mother    Bipolar disorder Mother    Learning disabilities Brother    ADD / ADHD Brother    Asthma Son    Hyperlipidemia Brother    Alcohol abuse Father    Heart disease Maternal Grandmother        heart attack, pacemaker   Heart attack Maternal Grandmother    Hypertension Maternal Grandmother    Hypertension Maternal Grandfather    Heart disease Paternal Grandmother        couple of major open heart surgeries, leaking valves   Hypertension Paternal Grandmother    Hypertension Paternal Grandfather    Breast cancer Neg Hx     Social History   Social History Narrative   Not on file     Review of Systems General: Denies fevers, chills, weight loss CV: Denies chest pain, shortness of breath, palpitations Breast: Patient recently had a benign breast mass evaluated with ultrasound and this was felt to be fat necrosis from trauma.  Otherwise she has no specific complaints other than her breasts are large and cause her upper back and neck discomfort.  Physical Exam    11/09/2022   10:15 AM 11/02/2022    2:04 PM 10/20/2022    2:02 PM  Vitals with BMI  Height 5\' 3"   5\' 3"   Weight 237 lbs 10 oz  235 lbs 14 oz  BMI 42.1  41.8  Systolic 148  120  Diastolic 91  76  Pulse 70  90     Information is confidential and restricted. Go to Review Flowsheets to unlock data.    General:  No acute distress,  Alert and oriented, Non-Toxic, Normal speech and affect Breast: Patient has extraordinarily  large breasts with grade 3 ptosis.  There are no palpable abnormalities and the nipples are normal in appearance.  Her sternal notch to nipple distance on the right is 42 cm and 43 cm on the left her nipple to fold distance is 20 cm  on the right and 19 cm on the left Mammogram: June 2024 BI-RADS 2 Assessment/Plan Macromastia: Patient endorses upper back and neck pain due to the large size of her breast.  I believe she would be a an acceptable candidate for a bilateral breast reduction.  I can remove between 900 and 1000 g per breast.  We discussed the procedure at length including the location of the incisions and the unpredictable nature of scarring.  We discussed the risks of bleeding, infection, and seroma formation.  We discussed the risk of nipple loss due to nipple ischemia.  Patient understands that due to the large size of her breasts that she has increased risk of nipple loss and the possibility of a free nipple graft.  She understands that I will use drains postoperatively.  We discussed the postoperative physical limitations including no heavy lifting greater than 20 pounds no vigorous activity and no submerging her incisions in water for 6 weeks.  It is that due to the large size of her breast and the large size of the reduction that she has at a higher risk for wound healing complications postoperatively.  She will need to wear a supportive compressive garment for 6 weeks after the procedure. The patient's BMI is currently 42.  I have asked her to work on a 10 to 15 pound weight loss to lower the risks of surgical complications.  Also stressed the importance of early ambulation to decrease the risk of DVT.  All questions were answered to her satisfaction.  Photographs were obtained today with her consent.  Will schedule her for a breast reduction at her request.  [name] 11/09/2022, 11:03 AM

## 2022-11-18 ENCOUNTER — Other Ambulatory Visit: Payer: Self-pay

## 2022-11-19 ENCOUNTER — Other Ambulatory Visit: Payer: Self-pay

## 2022-11-23 ENCOUNTER — Ambulatory Visit: Payer: Medicaid Other | Admitting: Physician Assistant

## 2022-12-08 NOTE — Progress Notes (Deleted)
Name: Catherine Berg   MRN: [id number]    DOB: 14-Nov-1980   Date:12/08/2022       Progress Note  Subjective  Chief Complaint  Follow up  HPI  HTN:  taking medication daily, no headache, chest pain or palpitation, bp is now at goal. 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 She had  depression prior to COVID but got worse after COVID and was having PTSD and worsening of her depression Since had a gap in insurance but now has Medicaid. She has  seen by  multiple doctors: Pulmonologist, Rheumatologist, Psychiatrist, Neurologist, PT, OT , she is under the care of Dr. Carlis Abbott and he recommended going back to PT and she will also have some cognitive testing done soon. She sees her Psychiatrist Dr. Vanetta Shawl . She is compliant   Post-ablation hypothyroidism: recently seen by Dr. Gershon Crane and is now on 88 mcg of levothyroxine daily, no constipation, she has mild dry skin    Asthma Moderate persistent  under the care of Dr. Belia Heman, she is on [facility] and montelukast and is doing better.    Perennial AR: she states symptoms are well controlled, not using nasal spray, but taking singulair and prn anti-histamines    Vitamin D deficiency: she would like to resume Vitamin D rx   B12 deficiency: reminded her to get SL B12 since last level was low and she has fatigue plus neuropathy , she would like to have an injection today    Morbid Obesity: weight is stable now   BMI is above 40, discussed life style modification, she has insurance but Medicaid does not pay for weight loss medication   Hydradenitis suppurativa: usually only under both arms but worse on left axilla  she stopped using deodorant  Patient Active Problem List   Diagnosis Date Noted    Strain of insertion of tendon of hamstring muscle 09/07/2022   MDD (major depressive disorder), recurrent episode, mild (HCC) 11/17/2021   Neuropathy involving both lower extremities 11/17/2021   Perennial allergic rhinitis with seasonal variation 11/17/2021   Chronic pain of both lower extremities 09/24/2020   Sleeps in sitting position due to orthopnea 07/02/2020   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   COVID-19 long hauler 07/02/2020   Vitamin D deficiency 04/22/2020   COVID-19 long hauler manifesting chronic fatigue 04/22/2020   History of COVID-19 02/28/2020   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   Memory loss 02/28/2020   Physical deconditioning 02/28/2020   Dyspnea on exertion 02/28/2020   Neck pain, bilateral 02/28/2020   Muscle spasms of neck 02/28/2020   Arthralgia of hand 01/24/2019   CRP elevated 01/24/2019   ESR raised 01/24/2019   Class 3 severe obesity due to excess calories without serious comorbidity in adult [facility]) 01/24/2019   Palpitations 09/16/2018   Coronary artery calcification 09/16/2018   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   Bilateral leg edema 04/20/2017   Incidental lung nodule, > 3mm and < 8mm 03/31/2017   Hives 09/02/2015   Morbid obesity (HCC) 08/15/2015   Anxiety 07/09/2015   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 06/28/2015   Thyroid nodule 02/01/2015   Vitamin D deficiency disease    Vitamin B12 deficiency    History of abnormal cervical Pap smear    Allergy-induced asthma, mild intermittent, uncomplicated 10/09/2013  Past Surgical History:  Procedure Laterality Date   CERVICAL POLYPECTOMY     CESAREAN SECTION     x 2   COLONOSCOPY WITH PROPOFOL N/A 11/14/2018   Procedure: COLONOSCOPY WITH PROPOFOL;  Surgeon: Pasty Spillers, MD;  Location: [facility];  Service: Endoscopy;  Laterality: N/A;   ESOPHAGOGASTRODUODENOSCOPY (EGD) WITH PROPOFOL N/A 11/14/2018   Procedure: ESOPHAGOGASTRODUODENOSCOPY (EGD) WITH PROPOFOL;  Surgeon: Pasty Spillers, MD;  Location: [facility];  Service: Endoscopy;  Laterality: N/A;  Latex allergy   GIVENS CAPSULE STUDY N/A 12/27/2018   Procedure: GIVENS CAPSULE STUDY;  Surgeon: Pasty Spillers, MD;  Location: [facility];  Service: Endoscopy;  Laterality: N/A;    Family History  Problem Relation Age of Onset   Heart attack Mother 24   Hypertension Mother    Asthma Mother    Cancer Mother        laryngeal   Diabetes Mother        pre diabetic   Heart disease Mother    Mental illness Mother    Alcohol abuse Mother    Drug abuse Mother    Depression Mother    Anxiety disorder Mother    Bipolar disorder Mother    Learning disabilities Brother    ADD / ADHD Brother    Asthma Son    Hyperlipidemia Brother    Alcohol abuse Father    Heart disease Maternal Grandmother        heart attack, pacemaker   Heart attack Maternal Grandmother    Hypertension Maternal Grandmother    Hypertension Maternal Grandfather    Heart disease Paternal Grandmother        couple of major open heart surgeries, leaking valves   Hypertension Paternal Grandmother    Hypertension Paternal Grandfather    Breast cancer Neg Hx     Social History   Tobacco Use   Smoking status: Never   Smokeless tobacco: Never  Substance Use Topics   Alcohol use: No    Alcohol/week: 0.0 standard drinks of alcohol     Current Outpatient Medications:    amLODipine (NORVASC) 5 MG tablet, Take 1 tablet (5 mg total) by mouth daily., Disp: 90 tablet, Rfl: 1   Azelastine HCl 137 MCG/SPRAY SOLN, SPRAY TWO SPRAYS IN EACH NOSTRIL TWICE DAILY., Disp: 30 mL, Rfl: 1   Cyanocobalamin (B-12) 1000 MCG SUBL, Place 1 tablet under the tongue daily., Disp: 90 tablet, Rfl: 1   dicyclomine (BENTYL) 10 MG capsule, Take 1 capsule (10 mg total) by mouth 4 (four) times daily -  before meals and at bedtime., Disp: 90 capsule, Rfl: 2   DULoxetine (CYMBALTA) 30 MG capsule, Take 1 capsule (30 mg total) by mouth daily. Take total of  90 mg daily. Take along with 60 mg cap, Disp: 30 capsule, Rfl: 1   DULoxetine (CYMBALTA) 60 MG capsule, Take 1 capsule (60 mg total) by mouth daily. Total of 90 mg daily. Take along with 30 mg cap, Disp: 30 capsule, Rfl: 4   etonogestrel (NEXPLANON) 68 MG IMPL implant, Inject 68 mg into the skin once., Disp: , Rfl:    levothyroxine (SYNTHROID) 88 MCG tablet, Take 88 mcg by mouth daily before breakfast., Disp: , Rfl:    meloxicam (MOBIC) 15 MG tablet, Take 15 mg by mouth daily., Disp: , Rfl:    metFORMIN (GLUCOPHAGE) 500 MG tablet, Take 1 tablet (500 mg total) by mouth daily with breakfast., Disp: 90 tablet, Rfl: 3   methylPREDNISolone (MEDROL PO),  Take 1 dose pk by oral route., Disp: , Rfl:    mometasone-formoterol (DULERA) 200-5 MCG/ACT AERO, Inhale 2 puffs into the lungs in the morning and at bedtime., Disp: 1 each, Rfl: 11   montelukast (SINGULAIR) 10 MG tablet, Take 1 tablet (10 mg total) by mouth at bedtime., Disp: 90 tablet, Rfl: 1   topiramate (TOPAMAX) 100 MG tablet, Take 1 tablet (100 mg total) by mouth at bedtime., Disp: 90 tablet, Rfl: 3   traZODone (DESYREL) 50 MG tablet, Take 0.5-1 tablets (25-50 mg total) by mouth at bedtime as needed for sleep., Disp: 30 tablet, Rfl: 1   VENTOLIN HFA 108 (90 Base) MCG/ACT inhaler, INHALE 2 PUFFS BY MOUTH EVERY 6 HOURS AS NEEDED FOR WHEEZING OR SHORTNESS OF BREATH, Disp: 18 g, Rfl: 1   Vitamin D, Ergocalciferol, (DRISDOL) 1.25 MG (50000 UNIT) CAPS capsule, Take 1 capsule (50,000 Units total) by mouth every 7 (seven) days., Disp: 12 capsule, Rfl: 1  Allergies  Allergen Reactions   Latex Hives    (Balloons, condoms, underwear elastic, gloves)   Shellfish Allergy Hives    I personally reviewed {Reviewed:[id number]} with the patient/caregiver today.   ROS  ***  Objective  There were no vitals filed for this visit.  There is no height or weight on file to calculate BMI.  Physical Exam ***  No results found for this or any previous visit (from  the past 2160 hour(s)).  Diabetic Foot Exam: Diabetic Foot Exam - Simple   No data filed    ***  PHQ2/9:    11/02/2022    2:16 PM 10/20/2022    2:04 PM 08/26/2022    8:00 AM 06/17/2022    9:27 AM 05/25/2022    9:13 AM  Depression screen PHQ 2/9  Decreased Interest  2 3 2 2   Down, Depressed, Hopeless  1 2 1 1   PHQ - 2 Score  3 5 3 3   Altered sleeping  2 3 3 2   Tired, decreased energy  1 3 3 2   Change in appetite  0 0 0 0  Feeling bad or failure about yourself   0 0 0 0  Trouble concentrating  1 2 0 0  Moving slowly or fidgety/restless  0 0 0 0  Suicidal thoughts  0 0 0 0  PHQ-9 Score  7 13 9 7   Difficult doing work/chores  Somewhat difficult   Somewhat difficult     Information is confidential and restricted. Go to Review Flowsheets to unlock data.    phq 9 is {gen pos [id number]} ***  Fall Risk:    10/20/2022    2:03 PM 09/22/2022   11:18 AM 08/26/2022    8:00 AM 08/11/2022    1:19 PM 06/17/2022    9:27 AM  Fall Risk   Falls in the past year? 1 0 1 0 1  Number falls in past yr: 1  0  0  Injury with Fall? 0  0  0  Risk for fall due to : History of fall(s)  No Fall Risks  No Fall Risks  Follow up Falls prevention discussed;Education provided;Falls evaluation completed  Falls prevention discussed  Falls prevention discussed   ***   Functional Status Survey:   ***   Assessment & Plan  *** There are no diagnoses linked to this encounter.

## 2022-12-09 ENCOUNTER — Ambulatory Visit: Payer: Medicaid Other | Admitting: Family Medicine

## 2022-12-11 NOTE — Progress Notes (Unsigned)
Name: Catherine Berg   MRN: [id number]    DOB: 05/04/1981   Date:12/11/2022       Progress Note  Subjective  Chief Complaint  Follow Up  HPI  HTN:  taking medication daily, no headache, chest pain or palpitation, bp is now at goal. 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 She had  depression prior to COVID but got worse after COVID and was having PTSD and worsening of her depression Since had a gap in insurance but now has Medicaid. She has  seen by  multiple doctors: Pulmonologist, Rheumatologist, Psychiatrist, Neurologist, PT, OT , she is under the care of Dr. Carlis Abbott and he recommended going back to PT and she will also have some cognitive testing done soon. She sees her Psychiatrist Dr. Vanetta Shawl . She is compliant   Post-ablation hypothyroidism: recently seen by Dr. Gershon Crane and is now on 88 mcg of levothyroxine daily, no constipation, she has mild dry skin    Asthma Moderate persistent  under the care of Dr. Belia Heman, she is on [facility] and montelukast and is doing better.    Perennial AR: she states symptoms are well controlled, not using nasal spray, but taking singulair and prn anti-histamines    Vitamin D deficiency: she would like to resume Vitamin D rx   B12 deficiency: reminded her to get SL B12 since last level was low and she has fatigue plus neuropathy , she would like to have an injection today    Morbid Obesity: weight is stable now   BMI is above 40, discussed life style modification, she has insurance but Medicaid does not pay for weight loss medication   Hydradenitis suppurativa: usually only under both arms but worse on left axilla  she stopped using deodorant   Patient Active Problem List   Diagnosis Date Noted    Strain of insertion of tendon of hamstring muscle 09/07/2022   MDD (major depressive disorder), recurrent episode, mild (HCC) 11/17/2021   Neuropathy involving both lower extremities 11/17/2021   Perennial allergic rhinitis with seasonal variation 11/17/2021   Chronic pain of both lower extremities 09/24/2020   Sleeps in sitting position due to orthopnea 07/02/2020   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   COVID-19 long hauler 07/02/2020   Vitamin D deficiency 04/22/2020   COVID-19 long hauler manifesting chronic fatigue 04/22/2020   History of COVID-19 02/28/2020   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   Memory loss 02/28/2020   Physical deconditioning 02/28/2020   Dyspnea on exertion 02/28/2020   Neck pain, bilateral 02/28/2020   Muscle spasms of neck 02/28/2020   Arthralgia of hand 01/24/2019   CRP elevated 01/24/2019   ESR raised 01/24/2019   Class 3 severe obesity due to excess calories without serious comorbidity in adult [facility]) 01/24/2019   Palpitations 09/16/2018   Coronary artery calcification 09/16/2018   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   Bilateral leg edema 04/20/2017   Incidental lung nodule, > 3mm and < 8mm 03/31/2017   Hives 09/02/2015   Morbid obesity (HCC) 08/15/2015   Anxiety 07/09/2015   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 06/28/2015   Thyroid nodule 02/01/2015   Vitamin D deficiency disease    Vitamin B12 deficiency    History of abnormal cervical Pap smear    Allergy-induced asthma, mild intermittent, uncomplicated 10/09/2013  Past Surgical History:  Procedure Laterality Date   CERVICAL POLYPECTOMY     CESAREAN SECTION     x 2   COLONOSCOPY WITH PROPOFOL N/A 11/14/2018   Procedure: COLONOSCOPY WITH PROPOFOL;  Surgeon: Pasty Spillers, MD;  Location: [facility];  Service: Endoscopy;  Laterality: N/A;   ESOPHAGOGASTRODUODENOSCOPY (EGD) WITH PROPOFOL N/A 11/14/2018   Procedure: ESOPHAGOGASTRODUODENOSCOPY (EGD) WITH PROPOFOL;  Surgeon: Pasty Spillers, MD;  Location: [facility];  Service: Endoscopy;  Laterality: N/A;  Latex allergy   GIVENS CAPSULE STUDY N/A 12/27/2018   Procedure: GIVENS CAPSULE STUDY;  Surgeon: Pasty Spillers, MD;  Location: [facility] ENDOSCOPY;  Service: Endoscopy;  Laterality: N/A;    Family History  Problem Relation Age of Onset   Heart attack Mother 47   Hypertension Mother    Asthma Mother    Cancer Mother        laryngeal   Diabetes Mother        pre diabetic   Heart disease Mother    Mental illness Mother    Alcohol abuse Mother    Drug abuse Mother    Depression Mother    Anxiety disorder Mother    Bipolar disorder Mother    Learning disabilities Brother    ADD / ADHD Brother    Asthma Son    Hyperlipidemia Brother    Alcohol abuse Father    Heart disease Maternal Grandmother        heart attack, pacemaker   Heart attack Maternal Grandmother    Hypertension Maternal Grandmother    Hypertension Maternal Grandfather    Heart disease Paternal Grandmother        couple of major open heart surgeries, leaking valves   Hypertension Paternal Grandmother    Hypertension Paternal Grandfather    Breast cancer Neg Hx     Social History   Tobacco Use   Smoking status: Never   Smokeless tobacco: Never  Substance Use Topics   Alcohol use: No    Alcohol/week: 0.0 standard drinks of alcohol     Current Outpatient Medications:    amLODipine (NORVASC) 5 MG tablet, Take 1 tablet (5 mg total) by mouth daily., Disp: 90 tablet, Rfl: 1   Azelastine HCl 137 MCG/SPRAY SOLN, SPRAY TWO SPRAYS IN EACH NOSTRIL TWICE DAILY., Disp: 30 mL, Rfl: 1   Cyanocobalamin (B-12) 1000 MCG SUBL, Place 1 tablet under the tongue daily., Disp: 90 tablet, Rfl: 1   dicyclomine (BENTYL) 10 MG capsule, Take 1 capsule (10 mg total) by mouth 4 (four) times daily -  before meals and at bedtime., Disp: 90 capsule, Rfl: 2   DULoxetine (CYMBALTA) 30 MG capsule, Take 1 capsule (30 mg total) by mouth daily. Take total of  90 mg daily. Take along with 60 mg cap, Disp: 30 capsule, Rfl: 1   DULoxetine (CYMBALTA) 60 MG capsule, Take 1 capsule (60 mg total) by mouth daily. Total of 90 mg daily. Take along with 30 mg cap, Disp: 30 capsule, Rfl: 4   etonogestrel (NEXPLANON) 68 MG IMPL implant, Inject 68 mg into the skin once., Disp: , Rfl:    levothyroxine (SYNTHROID) 88 MCG tablet, Take 88 mcg by mouth daily before breakfast., Disp: , Rfl:    meloxicam (MOBIC) 15 MG tablet, Take 15 mg by mouth daily., Disp: , Rfl:    metFORMIN (GLUCOPHAGE) 500 MG tablet, Take 1 tablet (500 mg total) by mouth daily with breakfast., Disp: 90 tablet, Rfl: 3   methylPREDNISolone (MEDROL PO),  Take 1 dose pk by oral route., Disp: , Rfl:    mometasone-formoterol (DULERA) 200-5 MCG/ACT AERO, Inhale 2 puffs into the lungs in the morning and at bedtime., Disp: 1 each, Rfl: 11   montelukast (SINGULAIR) 10 MG tablet, Take 1 tablet (10 mg total) by mouth at bedtime., Disp: 90 tablet, Rfl: 1   topiramate (TOPAMAX) 100 MG tablet, Take 1 tablet (100 mg total) by mouth at bedtime., Disp: 90 tablet, Rfl: 3   traZODone (DESYREL) 50 MG tablet, Take 0.5-1 tablets (25-50 mg total) by mouth at bedtime as needed for sleep., Disp: 30 tablet, Rfl: 1   VENTOLIN HFA 108 (90 Base) MCG/ACT inhaler, INHALE 2 PUFFS BY MOUTH EVERY 6 HOURS AS NEEDED FOR WHEEZING OR SHORTNESS OF BREATH, Disp: 18 g, Rfl: 1   Vitamin D, Ergocalciferol, (DRISDOL) 1.25 MG (50000 UNIT) CAPS capsule, Take 1 capsule (50,000 Units total) by mouth every 7 (seven) days., Disp: 12 capsule, Rfl: 1  Allergies  Allergen Reactions   Latex Hives    (Balloons, condoms, underwear elastic, gloves)   Shellfish Allergy Hives    I personally reviewed active problem list, medication list, allergies, family history, social history, health maintenance with the patient/caregiver today.   ROS  ***  Objective  There were no vitals filed for this visit.  There is no height or weight on file to calculate  BMI.  Physical Exam ***  No results found for this or any previous visit (from the past 2160 hour(s)).   PHQ2/9:    11/02/2022    2:16 PM 10/20/2022    2:04 PM 08/26/2022    8:00 AM 06/17/2022    9:27 AM 05/25/2022    9:13 AM  Depression screen PHQ 2/9  Decreased Interest  2 3 2 2   Down, Depressed, Hopeless  1 2 1 1   PHQ - 2 Score  3 5 3 3   Altered sleeping  2 3 3 2   Tired, decreased energy  1 3 3 2   Change in appetite  0 0 0 0  Feeling bad or failure about yourself   0 0 0 0  Trouble concentrating  1 2 0 0  Moving slowly or fidgety/restless  0 0 0 0  Suicidal thoughts  0 0 0 0  PHQ-9 Score  7 13 9 7   Difficult doing work/chores  Somewhat difficult   Somewhat difficult     Information is confidential and restricted. Go to Review Flowsheets to unlock data.    phq 9 is {gen pos [id number]}   Fall Risk:    10/20/2022    2:03 PM 09/22/2022   11:18 AM 08/26/2022    8:00 AM 08/11/2022    1:19 PM 06/17/2022    9:27 AM  Fall Risk   Falls in the past year? 1 0 1 0 1  Number falls in past yr: 1  0  0  Injury with Fall? 0  0  0  Risk for fall due to : History of fall(s)  No Fall Risks  No Fall Risks  Follow up Falls prevention discussed;Education provided;Falls evaluation completed  Falls prevention discussed  Falls prevention discussed      Functional Status Survey:      Assessment & Plan  *** There are no diagnoses linked to this encounter.

## 2022-12-14 ENCOUNTER — Encounter: Payer: Self-pay | Admitting: Family Medicine

## 2022-12-14 ENCOUNTER — Ambulatory Visit (INDEPENDENT_AMBULATORY_CARE_PROVIDER_SITE_OTHER): Payer: Medicaid Other | Admitting: Family Medicine

## 2022-12-14 VITALS — BP 130/78 | HR 93 | Resp 16 | Ht 64.0 in | Wt 236.0 lb

## 2022-12-14 DIAGNOSIS — E039 Hypothyroidism, unspecified: Secondary | ICD-10-CM

## 2022-12-14 DIAGNOSIS — I10 Essential (primary) hypertension: Secondary | ICD-10-CM | POA: Diagnosis not present

## 2022-12-14 DIAGNOSIS — E538 Deficiency of other specified B group vitamins: Secondary | ICD-10-CM | POA: Diagnosis not present

## 2022-12-14 DIAGNOSIS — R300 Dysuria: Secondary | ICD-10-CM | POA: Diagnosis not present

## 2022-12-14 DIAGNOSIS — L304 Erythema intertrigo: Secondary | ICD-10-CM

## 2022-12-14 DIAGNOSIS — E559 Vitamin D deficiency, unspecified: Secondary | ICD-10-CM

## 2022-12-14 DIAGNOSIS — F331 Major depressive disorder, recurrent, moderate: Secondary | ICD-10-CM

## 2022-12-14 LAB — POCT URINALYSIS DIPSTICK
Appearance: NORMAL
Bilirubin, UA: NEGATIVE
Blood, UA: NEGATIVE
Glucose, UA: NEGATIVE
Ketones, UA: NEGATIVE
Leukocytes, UA: NEGATIVE
Nitrite, UA: NEGATIVE
Protein, UA: NEGATIVE
Spec Grav, UA: 1.01 (ref 1.010–1.025)
Urobilinogen, UA: 0.2 E.U./dL
pH, UA: 6 (ref 5.0–8.0)

## 2022-12-14 MED ORDER — VITAMIN D (ERGOCALCIFEROL) 1.25 MG (50000 UNIT) PO CAPS: 50000.0000 [IU] | ORAL_CAPSULE | ORAL | 1 refills | Status: DC

## 2022-12-14 MED ORDER — NITROFURANTOIN MONOHYD MACRO 100 MG PO CAPS
100.0000 mg | ORAL_CAPSULE | Freq: Two times a day (BID) | ORAL | 0 refills | Status: DC
Start: 2022-12-14 — End: 2023-03-01

## 2022-12-14 MED ORDER — AMLODIPINE BESYLATE 5 MG PO TABS: 5.0000 mg | ORAL_TABLET | Freq: Every day | ORAL | 1 refills | Status: DC

## 2022-12-14 MED ORDER — CYANOCOBALAMIN 1000 MCG/ML IJ SOLN
1000.0000 ug | Freq: Once | INTRAMUSCULAR | Status: AC
Start: 2022-12-14 — End: 2022-12-14
  Administered 2022-12-14: 1000 ug via INTRAMUSCULAR

## 2022-12-14 NOTE — Patient Instructions (Signed)
Mix clotrimazole otc with hydrocortisone 1% also otc

## 2022-12-14 NOTE — Addendum Note (Signed)
Addended by: Dollene Primrose on: 12/14/2022 10:37 AM   Modules accepted: Orders

## 2022-12-24 ENCOUNTER — Encounter
Payer: Medicare Other | Attending: Physical Medicine and Rehabilitation | Admitting: Physical Medicine and Rehabilitation

## 2022-12-24 ENCOUNTER — Encounter: Payer: Self-pay | Admitting: Physical Medicine and Rehabilitation

## 2022-12-24 VITALS — BP 138/89 | HR 71 | Ht 64.0 in | Wt 234.0 lb

## 2022-12-24 DIAGNOSIS — G609 Hereditary and idiopathic neuropathy, unspecified: Secondary | ICD-10-CM | POA: Insufficient documentation

## 2022-12-24 DIAGNOSIS — Z6841 Body Mass Index (BMI) 40.0 and over, adult: Secondary | ICD-10-CM | POA: Insufficient documentation

## 2022-12-24 DIAGNOSIS — U099 Post covid-19 condition, unspecified: Secondary | ICD-10-CM | POA: Diagnosis present

## 2022-12-24 DIAGNOSIS — R0602 Shortness of breath: Secondary | ICD-10-CM | POA: Diagnosis not present

## 2022-12-24 DIAGNOSIS — R5383 Other fatigue: Secondary | ICD-10-CM | POA: Diagnosis not present

## 2022-12-24 DIAGNOSIS — G9332 Myalgic encephalomyelitis/chronic fatigue syndrome: Secondary | ICD-10-CM | POA: Insufficient documentation

## 2022-12-24 DIAGNOSIS — R4589 Other symptoms and signs involving emotional state: Secondary | ICD-10-CM | POA: Insufficient documentation

## 2022-12-24 DIAGNOSIS — G4701 Insomnia due to medical condition: Secondary | ICD-10-CM | POA: Insufficient documentation

## 2022-12-24 DIAGNOSIS — R4189 Other symptoms and signs involving cognitive functions and awareness: Secondary | ICD-10-CM | POA: Diagnosis not present

## 2022-12-24 MED ORDER — METFORMIN HCL 500 MG PO TABS: 500.0000 mg | ORAL_TABLET | Freq: Every day | ORAL | 3 refills | Status: AC

## 2022-12-24 MED ORDER — CAPSAICIN-CLEANSING GEL 8 % EX KIT
4.0000 | PACK | Freq: Once | CUTANEOUS | Status: AC
Start: 2022-12-24 — End: 2022-12-24
  Administered 2022-12-24: 4 via TOPICAL

## 2022-12-24 MED ORDER — TOPIRAMATE 100 MG PO TABS: 100.0000 mg | ORAL_TABLET | Freq: Every evening | ORAL | 3 refills | Status: DC

## 2022-12-24 NOTE — Telephone Encounter (Signed)
Patient called back and rescheduled

## 2022-12-24 NOTE — Progress Notes (Signed)
Subjective:    Patient ID: Catherine Berg, female    DOB: 01/17/1981, 42 y.o.   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 It makes her sleepy.  -she has tried a couple of things but nothing has helped -pain feels burning  -extends from her thighs to ankle -she tries to rest her legs so they do not hurt as badly at night.  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  -the lack of control really bothers her -some days this really gets to her -she forgets to pay bills if she does not do it right away.  -she feels that not having insurance has put her in behind from getting better -she talked to her brother on the way up here.   2) confusion:  -it is embarrassing to her -she did speech language therapy, she has not been able to do any more because of lack of insurance -she holds back on her medicine when she notes it affecting her cognition -she feels this is one of her top challenges daily.  -she had been out of work for a long time before she came to see me -they put it in that she resigned her work so she lost her benefits.  -she saw Dr. Kieth Brightly.   3) Peripheral neuropathy -has been severe this past weekend -topamax was helping before but is no longer helping -she gets long term disability through her job, but SSI was denied- she is hoping they will change her mind. If she could get this she  would get medicaid too. 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 Because she has assets (she has a home, she has a car, and her son has a car), they make it difficult for her to get any services.  -her feet have itching sensation in the top really bad -today has been really hurting.  -feels separated up the legs -she is ready to repeat Qutenza today  4) Bilateral ankle pain  5) Brain fog -still present but improving  6) Fatigue: -still present but improved -continue B12 and vitamin D supplementation  7) Loneliness: -she is not looking forward to her kids going back to school   Pain Inventory Average Pain 8 Pain Right Now 7 My pain is dull and aching  In the last 24 hours, has pain interfered with the following? General activity 1 Relation with others 3 Enjoyment of life 1 What TIME of day is your pain at its worst? varies Sleep (in general) Poor  Pain is worse with: inactivity Pain improves with: medication Relief from Meds: 2    Family History  Problem Relation Age of Onset   Heart attack Mother 11   Hypertension Mother    Asthma Mother    Cancer Mother        laryngeal   Diabetes Mother  pre diabetic   Heart disease Mother    Mental illness Mother    Alcohol abuse Mother    Drug abuse Mother    Depression Mother    Anxiety disorder Mother    Bipolar disorder Mother    Learning disabilities Brother    ADD / ADHD Brother    Asthma Son    Hyperlipidemia Brother    Alcohol abuse Father    Heart disease Maternal Grandmother        heart attack, pacemaker   Heart attack Maternal Grandmother    Hypertension Maternal Grandmother    Hypertension Maternal Grandfather    Heart disease Paternal Grandmother        couple of major open heart surgeries, leaking valves   Hypertension Paternal Grandmother    Hypertension Paternal Grandfather    Breast cancer Neg Hx    Social History   Socioeconomic History   Marital status: Single    Spouse name: Not on  file   Number of children: 2   Years of education: 11   Highest education level: High school graduate  Occupational History   Not on file  Tobacco Use   Smoking status: Never   Smokeless tobacco: Never  Vaping Use   Vaping status: Never Used  Substance and Sexual Activity   Alcohol use: No    Alcohol/week: 0.0 standard drinks of alcohol   Drug use: No   Sexual activity: Yes    Partners: Male    Birth control/protection: None  Other Topics Concern   Not on file  Social History Narrative   Not on file   Social Determinants of Health   Financial Resource Strain: Medium Risk (06/17/2022)   Overall Financial Resource Strain (CARDIA)    Difficulty of Paying Living Expenses: Somewhat hard  Food Insecurity: Food Insecurity Present (06/17/2022)   Hunger Vital Sign    Worried About Running Out of Food in the Last Year: Sometimes true    Ran Out of Food in the Last Year: Patient declined  Transportation Needs: No Transportation Needs (06/17/2022)   PRAPARE - Administrator, Civil Service (Medical): No    Lack of Transportation (Non-Medical): No  Physical Activity: Inactive (06/17/2022)   Exercise Vital Sign    Days of Exercise per Week: 0 days    Minutes of Exercise per Session: 0 min  Stress: Stress Concern Present (06/17/2022)   [employer] of Occupational Health - Occupational Stress Questionnaire    Feeling of Stress : Very much  Social Connections: Moderately Isolated (06/17/2022)   Social Connection and Isolation Panel [NHANES]    Frequency of Communication with Friends and Family: Twice a week    Frequency of Social Gatherings with Friends and Family: Once a week    Attends Religious Services: Never    Database administrator or Organizations: Yes    Attends Banker Meetings: 1 to 4 times per year    Marital Status: Never married   Past Surgical History:  Procedure Laterality Date   CERVICAL POLYPECTOMY     CESAREAN SECTION     x 2    COLONOSCOPY WITH PROPOFOL N/A 11/14/2018   Procedure: COLONOSCOPY WITH PROPOFOL;  Surgeon: Pasty Spillers, MD;  Location: [facility];  Service: Endoscopy;  Laterality: N/A;   ESOPHAGOGASTRODUODENOSCOPY (EGD) WITH PROPOFOL N/A 11/14/2018   Procedure: ESOPHAGOGASTRODUODENOSCOPY (EGD) WITH PROPOFOL;  Surgeon: Pasty Spillers, MD;  Location: [facility];  Service: Endoscopy;  Laterality: N/A;  Latex allergy   GIVENS CAPSULE STUDY N/A 12/27/2018   Procedure: GIVENS CAPSULE STUDY;  Surgeon: Pasty Spillers, MD;  Location: [facility] ENDOSCOPY;  Service: Endoscopy;  Laterality: N/A;   Past Medical History:  Diagnosis Date   Abnormal thyroid blood test    Anemia    Anxiety    Asthma    Complication of anesthesia    itching after c sections   COVID-19 01/2020   Depression    Elevated serum glutamic pyruvic transaminase (SGPT) level    Graves disease    History of abnormal cervical Pap smear    History of cervical polypectomy    Hives 09/02/2015   Hypertension    Hypothyroidism    IFG (impaired fasting glucose)    Incidental lung nodule, > 3mm and < 8mm 03/31/2017   4 mm RLL lung nodule on chest CT Mar 31, 2017   Low serum vitamin D    Obesity    PONV (postoperative nausea and vomiting)    after c section had vomit   Pregnancy induced hypertension    with 1st pregnancy, normal pressure with 2nd   Reflux    Thyroid disease    Vitamin B12 deficiency    Vitamin D deficiency disease    Wears dentures    full upper   BP 138/89   Pulse 71   Ht 5\' 4"  (1.626 m)   Wt 234 lb (106.1 kg)   SpO2 98%   BMI 40.17 kg/m   Opioid Risk Score:   Fall Risk Score:  `1  Depression screen PHQ 2/9     12/24/2022   10:10 AM 12/14/2022   10:03 AM 11/02/2022    2:16 PM 10/20/2022    2:04 PM 08/26/2022    8:00 AM 06/17/2022    9:27 AM 05/25/2022    9:13 AM  Depression screen PHQ 2/9  Decreased Interest 0 2  2 3 2 2   Down, Depressed, Hopeless 0 1  1 2 1 1   PHQ - 2 Score 0 3  3  5 3 3   Altered sleeping  1  2 3 3 2   Tired, decreased energy  3  1 3 3 2   Change in appetite  0  0 0 0 0  Feeling bad or failure about yourself   1  0 0 0 0  Trouble concentrating  0  1 2 0 0  Moving slowly or fidgety/restless  0  0 0 0 0  Suicidal thoughts  0  0 0 0 0  PHQ-9 Score  8  7 13 9 7   Difficult doing work/chores    Somewhat difficult   Somewhat difficult     Information is confidential and restricted. Go to Review Flowsheets to unlock data.      Review of Systems  Constitutional: Negative.   HENT: Negative.    Eyes: Negative.   Respiratory: Negative.    Cardiovascular: Negative.   Gastrointestinal: Negative.   Endocrine: Negative.   Genitourinary: Negative.   Musculoskeletal: Negative.        Pain from thighs to lower legs in both legs  Skin: Negative.   Allergic/Immunologic: Negative.   Neurological: Negative.   Hematological: Negative.   Psychiatric/Behavioral:  Positive for dysphoric mood. Negative for confusion. The patient is not nervous/anxious.   All other systems reviewed and are negative.      Objective:   Physical Exam Gen: no distress, normal appearing HEENT: oral mucosa pink and moist, NCAT Cardio: Reg rate Chest:  normal effort, normal rate of breathing Abd: soft, non-distended Ext: no edema Psych: pleasant, normal affect Skin: intact, no open lesions on feet Neuro: Alert and oriented     Assessment & Plan:  Catherine Berg is a 42 year old woman who presents to establish care for bilateral lower extremity pain, neuropathy, and long COVID syndrome  1) Bilateral lower extremity neuropathy s/p LONG COVID syndrome Refilled metformin 500mg  daily. Cr reviewed and is 0.9  -Discussed Qutenza as an option for neuropathic pain control. Discussed that this is a capsaicin patch, stronger than capsaicin cream. Discussed that it is currently approved for diabetic peripheral neuropathy and post-herpetic neuralgia, but that it has also shown benefit in  treating other forms of neuropathy. Provided patient with link to site to learn more about the patch: https://www.clark.biz/. Discussed that the patch would be placed in office and benefits usually last 3 months. Discussed that unintended exposure to capsaicin can cause severe irritation of eyes, mucous membranes, respiratory tract, and skin, but that Qutenza is a local treatment and does not have the systemic side effects of other nerve medications. Discussed that there may be pain, itching, erythema, and decreased sensory function associated with the application of Qutenza. Side effects usually subside within 1 week. A cold pack of analgesic medications can help with these side effects. Blood pressure can also be increased due to pain associated with administration of the patch.   4 patches of Qutenza was applied to the area of pain. Ice packs were applied during the procedure to ensure patient comfort. Blood pressure was monitored every 15 minutes. The patient tolerated the procedure well. Post-procedure instructions were given and follow-up has been scheduled.    -increase topamax to 100mg  HS  -Discussed current symptoms of pain and history of pain.  -discussed her disability -discussed that she was unable to get cognitive testing -discussed her response to ibuprofen and tylenol -Discussed benefits of exercise in reducing pain. -Continue gabapentin to 300mg  TID PRN. Discussed that this helped.  -completing disability paperwork for her  -prescribed low dose naltrexone in the past but she currently does not have insurance.  -Provided with a pain relief journal and discussed that it contains foods and lifestyle tips to naturally help to improve pain. Discussed that these lifestyle strategies are also very good for health unlike some medications which can have negative side effects. Discussed that the act of keeping a journal can be therapeutic and helpful to realize patterns what helps to trigger  and alleviate pain.   -Discussed current symptoms of pain and history of pain.  -Discussed benefits of exercise in reducing pain. -Discussed following foods that may reduce pain: 1) Ginger (especially studied for arthritis)- reduce leukotriene production to decrease inflammation 2) Blueberries- high in phytonutrients that decrease inflammation 3) Salmon- marine omega-3s reduce joint swelling and pain 4) Pumpkin seeds- reduce inflammation 5) dark chocolate- reduces inflammation 6) turmeric- reduces inflammation 7) tart cherries - reduce pain and stiffness 8) extra virgin olive oil - its compound olecanthal helps to block prostaglandins  9) chili peppers- can be eaten or applied topically via capsaicin 10) mint- helpful for headache, muscle aches, joint pain, and itching 11) garlic- reduces inflammation  Link to further information on diet for chronic pain: http://[email or web address].com/    2) Expressive aphasia -very embarrassing to her -continue SLP HEP -continue to follow with neuropsych once she has Medicaid to pay for their appointments -discussed her conversation with Dr. Kieth Brightly which was very helpful for her.   3)  Impaired memory -discussed how difficult it is for her as other people cannot see this -discussed that her family is supportive.   4) Insomnia: -continue topamax 100mg  HS -recommended tart cherry juice after dinner -Try to go outside near sunrise -Get exercise during the day.  -Turn off all devices an hour before bedtime.  -Teas that can benefit: chamomile, valerian root, Brahmi (Bacopa) -Can consider over the counter melatonin, magnesium, and/or L-theanine. Melatonin is an anti-oxidant with multiple health benefits. Magnesium is involved in greater than 300 enzymatic reactions in the body and most of [place] are deficient as our soil is often depleted. There are 7 different types of magnesium-  Bioptemizer's is a supplement with all 7 types, and each has unique benefits. Magnesium can also help with constipation and anxiety.  -Pistachios naturally increase the production of melatonin -Cozy Earth bamboo bed sheets are free from toxic chemicals.  -Tart cherry juice or a tart cherry supplement can improve sleep and soreness post-workout  5) Numbness and tingling in hands -discussed that it is hard to bend them when she sleeps -prescribed metformin 500mg  daily -B6 supplement recommended  -continue B12  6) Brain fog: -discussed that she is improving but still has brain fog  7) Loneliness: -discussed joining book club or church choir when kids go back to school  8) Class 3 obesity: -commended on 3 lbs weight loss!  9) Shortness of breath: -discussed how this limits her activity  >40 minutes spent in discussion of risks and benefits of Qutenza and obtaining informed consent, discussion of q90 day follow-up and expectation of improvement in pain with each repeat application

## 2022-12-24 NOTE — Therapy (Addendum)
OUTPATIENT PHYSICAL THERAPY EVALUATION   Patient Name: Catherine Berg MRN: [id number] DOB:05-16-1980, 42 y.o., female Today's Date: 12/30/2022  END OF SESSION:  PT End of Session - 12/30/22 1419     Visit Number 1    Number of Visits 7    Date for PT Re-Evaluation 03/24/23    Authorization Type MEDICARE PART B reporting period from 12/30/2022    Progress Note Due on Visit 10    PT Start Time 0948    PT Stop Time 1030    PT Time Calculation (min) 42 min    Activity Tolerance Patient limited by fatigue;Patient limited by pain    Behavior During Therapy [facility] for tasks assessed/performed             Past Medical History:  Diagnosis Date   Abnormal thyroid blood test    Anemia    Anxiety    Asthma    Complication of anesthesia    itching after c sections   COVID-19 01/2020   Depression    Elevated serum glutamic pyruvic transaminase (SGPT) level    Graves disease    History of abnormal cervical Pap smear    History of cervical polypectomy    Hives 09/02/2015   Hypertension    Hypothyroidism    IFG (impaired fasting glucose)    Incidental lung nodule, > 3mm and < 8mm 03/31/2017   4 mm RLL lung nodule on chest CT Mar 31, 2017   Low serum vitamin D    Obesity    PONV (postoperative nausea and vomiting)    after c section had vomit   Pregnancy induced hypertension    with 1st pregnancy, normal pressure with 2nd   Reflux    Thyroid disease    Vitamin B12 deficiency    Vitamin D deficiency disease    Wears dentures    full upper   Past Surgical History:  Procedure Laterality Date   CERVICAL POLYPECTOMY     CESAREAN SECTION     x 2   COLONOSCOPY WITH PROPOFOL N/A 11/14/2018   Procedure: COLONOSCOPY WITH PROPOFOL;  Surgeon: Pasty Spillers, MD;  Location: [facility];  Service: Endoscopy;  Laterality: N/A;   ESOPHAGOGASTRODUODENOSCOPY (EGD) WITH PROPOFOL N/A 11/14/2018   Procedure: ESOPHAGOGASTRODUODENOSCOPY (EGD) WITH PROPOFOL;  Surgeon: Pasty Spillers, MD;  Location: [facility];  Service: Endoscopy;  Laterality: N/A;  Latex allergy   GIVENS CAPSULE STUDY N/A 12/27/2018   Procedure: GIVENS CAPSULE STUDY;  Surgeon: Pasty Spillers, MD;  Location: [facility];  Service: Endoscopy;  Laterality: N/A;   Patient Active Problem List   Diagnosis Date Noted   Strain of insertion of tendon of hamstring muscle 09/07/2022   MDD (major depressive disorder), recurrent episode, mild (HCC) 11/17/2021   Neuropathy involving both lower extremities 11/17/2021   Perennial allergic rhinitis with seasonal variation 11/17/2021   Chronic pain of both lower extremities 09/24/2020   Sleeps in sitting position due to orthopnea 07/02/2020   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   Vitamin D deficiency 04/22/2020   COVID-19 long hauler manifesting chronic fatigue 04/22/2020   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   Memory loss 02/28/2020   Physical deconditioning 02/28/2020   Dyspnea on exertion 02/28/2020   Neck pain, bilateral 02/28/2020   Muscle spasms of neck 02/28/2020   Arthralgia of hand 01/24/2019   CRP elevated 01/24/2019   ESR raised 01/24/2019   Class 3 severe obesity due to excess calories without serious comorbidity in adult [facility]) 01/24/2019  Palpitations 09/16/2018   Coronary artery calcification 09/16/2018   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   Bilateral leg edema 04/20/2017   Incidental lung nodule, > 3mm and < 8mm 03/31/2017   Hives 09/02/2015   Morbid obesity (HCC) 08/15/2015   Anxiety 07/09/2015   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 06/28/2015   Thyroid nodule 02/01/2015   Vitamin B12 deficiency    History of abnormal cervical Pap smear    Allergy-induced asthma, mild intermittent, uncomplicated 10/09/2013    PCP: [name], MD  REFERRING PROVIDER: [name], MD  REFERRING DIAG: macromastia, chronic pain of both shoulders, chronic bilateral thoracic back pain  Rationale for Evaluation and  Treatment: Rehabilitation  THERAPY DIAG:  Pain in thoracic spine  Cervicalgia  Chronic pain of both shoulders  ONSET DATE: prior to 2018  SUBJECTIVE:                                                                                                                                                                                           SUBJECTIVE STATEMENT: Patient reports she is coming to PT because she is having pain in her bilateral upper traps, neck, shoulders, and upper back associated with having large breasts. She states she is seeking breast reduction surgery to help improve her pain and has been referred to PT to try conservative care before proceeding with surgery. She states she has pain more on the right compared to the left. She has very deep indentations over her shoulders where her bra straps dig in. She feels like her large breasts pull her posture forwards. She states she has been continuing to struggle with long COVID-19 symptoms manifesting as chronic fatigue. She has heart palpitations and dyspnea upon exertion, so any extra work to breathe or move her body around such as she experiences with increased breast volume exacerbates those conditions.   She feels very self conscious of her large breasts and how they push her shirt forwards where people seem to look at her breasts and shirt to the point where she feels like she cannot just talk to people and that her breasts are always part of the conversation. She feels like the weight of her breasts bother her breathing. She has tried to get larger support bras and she can only get larger cup and they are not cute and they just squeeze her chest so it is hard to breath. She has also been getting a lot of bad rashes, especially this year. Her rashes are continual now. This is aggravated by her skin folds from her large breasts, the sweating and lack of air flow, especially with trying to use more  supportive garments.   She states her  current bra size is 42G. She has always had large breasts since she was in elementary school. She thinks she started having pain associated with her large breasts for many years and spoke with a doctor about getting a breast reduction in 2018 or 2019. She has lost weight before and been at a smaller body size (at least 30lbs lighter) and it has never changed her cup size or improved her pain because her breasts never got smaller. She does get numbness and tingling in her hands with known neuropathy. She feels like the pain she has associated with her large breasts is "just there" and nothing makes it worse. She just learned to live with it, but it is constant burden.   She is right handed.     PERTINENT HISTORY:  Patient is a 42 y.o. female who presents to outpatient physical therapy with a referral for medical diagnosis macromastia, chronic pain of both shoulders, chronic bilateral thoracic back pain. This patient's chief complaints consist of chronic pain associated with large breasts including pain at the thoracic spine, base of neck, over bilateral upper traps and where bra straps cut into the tops of her shoulders, difficulty with breathing (related to weight on her chest and/or tightness of bras/garments to support her breasts), and difficulty with social interactions and function leading to the following functional deficits: difficulty with prolonged standing, socializing, talking to people, jumping, running, generalized mobility, fatigue, shortness of breath, bending over, sleeping position, laying prone, any activity that requires increased oxygen demand. Relevant past medical history and comorbidities include has Vitamin B12 deficiency; History of abnormal cervical Pap smear; Thyroid nodule; Allergy-induced asthma, mild intermittent, uncomplicated; 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; Anxiety; Morbid obesity (HCC); Hives; Incidental lung nodule, > 3mm and < 8mm; Bilateral leg edema; Palpitations; Coronary  artery calcification; Postablative hypothyroidism; Arthralgia of hand; CRP elevated; ESR raised; Class 3 severe obesity due to excess calories without serious comorbidity in adult [facility]); Olfactory impairment; Memory loss; Physical deconditioning; Dyspnea on exertion; Neck pain, bilateral; Muscle spasms of neck; Vitamin D deficiency; COVID-19 long hauler manifesting chronic fatigue; Sleeps in sitting position due to orthopnea; Postviral fatigue syndrome; Chronic pain of both lower extremities; MDD (major depressive disorder), recurrent episode, mild (HCC); Neuropathy involving both lower extremities; Perennial allergic rhinitis with seasonal variation; and Strain of insertion of tendon of hamstring muscle on their problem list. She  has a past medical history of Abnormal thyroid blood test, Anemia, Anxiety, Asthma, Complication of anesthesia, COVID-19 (01/2020), Depression, Elevated serum glutamic pyruvic transaminase (SGPT) level, Graves disease, History of abnormal cervical Pap smear, History of cervical polypectomy, Hives (09/02/2015), Hypertension, Hypothyroidism, IFG (impaired fasting glucose), Incidental lung nodule, > 3mm and < 8mm (03/31/2017), Low serum vitamin D, Obesity, PONV (postoperative nausea and vomiting), Pregnancy induced hypertension, Reflux, Thyroid disease, Vitamin B12 deficiency, Vitamin D deficiency disease, and Wears dentures.  has a past surgical history that includes Cervical polypectomy; Cesarean section; Colonoscopy with propofol (N/A, 11/14/2018); Esophagogastroduodenoscopy (egd) with propofol (N/A, 11/14/2018); and Givens capsule study (N/A, 12/27/2018). Patient denies hx of cancer, stroke, seizures, diabetes, unexplained weight loss, unexplained changes in bowel or bladder problems, unexplained stumbling or dropping things, osteoporosis, and spinal surgery. Endorses asthma and heart palpitations.   PAIN:  Are you having pain? Yes NPRS: Current: 7/10,  Best: 6/10, Worst: 10/10. Pain  location: B anterior shoulders (R> L, thoracic spine from bra strap to base of neck. She does get headaches sometimes. She gets pain where  her bra straps are.  Pain description: radiating pain, more like shooting, "like when your tooth hurts" like a pulse. Numbness and tingling in her hands.  Aggravating factors: nothing in particular, it is "just there." Jumping, running, prolonged standing, wearing a bra all day Relieving factors: taking ibuprofen helps a little, taking a bra off.   FUNCTIONAL LIMITATIONS: difficulty with prolonged standing, socializing, talking to people, jumping, running, generalized mobility, fatigue, shortness of breath, bending over, sleeping position, laying prone, any activity that requires increased oxygen demand.   LEISURE: I don't really have fun  PRECAUTIONS: post-exertional malaise,   WEIGHT BEARING RESTRICTIONS: No  FALLS:  Has patient fallen in last 6 months? Yes. Number of falls 1-6 Larey Seat out of the car 2 weeks ago, fell off the porch but unsure if it was within the last 6 months. She has neuropathy in her feet. She has been doing QUTENZA treatment in the clinic with Dr. Carlis Abbott.  LIVING ENVIRONMENT: Lives with: son and daughter (teens) Lives in: 1 story home  Stairs: 3 steps to enter with no handrail She does not use assistive devices.   OCCUPATION: on disability due to long COVID-19  PLOF: Independent  PATIENT GOALS: "to get better"  NEXT MD VISIT: 02/02/2023  OBJECTIVE  DIAGNOSTIC FINDINGS:  No known recent relevant imaging.   OBSERVATION/INSPECTION Posture Posture (seated): very upright posture through thoracic spine, forward head with increased kyphosis at CT junction. R shoulder slightly higher than left. Other observations: Very large breast size. Deep indentation with discoloration of skin under bilateral bra straps.  Anthropometrics Tremor: none Body composition: obesity Muscle bulk: no obvious abnormalities Edema: none noted in B  UE Functional Mobility Bed mobility: not tested Transfers: sit <> stand WNL Gait: grossly WFL for household and short community ambulation with increased fatigue. More detailed gait analysis deferred to later date as needed.   SPINE MOTION CERVICAL SPINE AROM *Indicates pain Flexion: 80 increased upper back pain Extension: 40  Side Flexion:   R 40 contralateral pulling pain at UT  L 40 contralateral pulling pain at UT (more intense than when bending R).  Rotation:  R 70 feels in both sides of face L 70 feels in both sides of face.    PERIPHERAL JOINT MOTION (in degrees)  ACTIVE RANGE OF MOTION (AROM) Comments:  12/30/2022: B UE grossly WNL.   MUSCLE PERFORMANCE (MMT):  *Indicates pain 12/30/22 Date Date  Joint/Motion R/L R/L R/L  Shoulder     Flexion 4/4 / /  Abduction (C5) 5*/5 / /  External rotation 4+/4+ / /  Internal rotation 5/5 / /  Elbow     Flexion (C6) 4/4 / /  Extension (C7) 5/5 / /  Hand     Thumb extension (C8) B WNL / /  Finger abduction (T1) B WNL / /  Comments:   SPECIAL TESTS:  CERVICAL SPINE Cervical spine axial compression: negative Spurling's part B:  R = negative, L = negative Cervical spine axial distraction: negative   ACCESSORY MOTION: Tender to CPA in seated position along entire cervical spine and upper thoracic spine without radiation, patient reports pre-existing hypersensitivity to touch.   PALPATION: TTP with tightness and muscle bulk at bilateral UT Diffusely TTP wherever touched while doing MMT and palpation. patient reports pre-existing hypersensitivity to touch.    TODAY'S TREATMENT:  education  PATIENT EDUCATION:  Education details: Education on diagnosis, prognosis, POC, anatomy and physiology of current condition.  Person educated: Patient Education method: Explanation Education comprehension: verbalized  understanding and needs further education  HOME EXERCISE PROGRAM: TBA  ASSESSMENT:  CLINICAL  IMPRESSION: Patient is a 42 y.o. female referred to outpatient physical therapy with a medical diagnosis of macromastia, chronic pain of both shoulders, chronic bilateral thoracic back pain who presents with signs and symptoms consistent with bilateral neck, shoulder, and thoracic spine pain associated with mechanical force of excessively large breasts. Patient presents with significant pain, joint stiffness, ROM, posture, activity tolerance, muscle tension, hypersensitivity, and muscle performance (strength/power/endurance) impairments that are limiting ability to complete with prolonged standing, socializing, talking to people, jumping, running, generalized mobility, fatigue, shortness of breath, bending over, sleeping position, laying prone, any activity that requires increased oxygen demand without difficulty. By nature, physical therapy is unable to resolve exacerbating excessive load of large breasts on musculoskeletal system, but insurance will not cover any further treatment for this condition until PT is attempted to resolve it. Patient also has comorbid conditions the significantly limit her tolerance for exercises-based intervention, which will likey affect her success with physical therapy. Patient would benefit most from physical therapy post breast reduction if she continues to have pain. However, per insurance requirements, plan to provide trial of skilled physical therapy intervention to address smaller musculoskeletal contribution of current body structure impairments and activity limitations to improve function and work towards goals set in current POC in order to return to prior level of function or maximal functional improvement.   OBJECTIVE IMPAIRMENTS: decreased activity tolerance, decreased cognition, decreased endurance, decreased ROM, decreased strength, impaired perceived functional ability, increased muscle spasms, impaired UE functional use, improper body mechanics, postural  dysfunction, obesity, and pain.   ACTIVITY LIMITATIONS: carrying, lifting, bending, sitting, standing, and sleeping  PARTICIPATION LIMITATIONS: interpersonal relationship, shopping, community activity, and   difficulty with prolonged standing, socializing, talking to people, jumping, running, generalized mobility, fatigue, shortness of breath, bending over, sleeping position, laying prone, any activity that requires increased oxygen demand  PERSONAL FACTORS: Fitness, Past/current experiences, Time since onset of injury/illness/exacerbation, and 3+ comorbidities:   Vitamin B12 deficiency; History of abnormal cervical Pap smear; Thyroid nodule; Allergy-induced asthma, mild intermittent, uncomplicated; 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; Anxiety; Morbid obesity (HCC); Hives; Incidental lung nodule, > 3mm and < 8mm; Bilateral leg edema; Palpitations; Coronary artery calcification; Postablative hypothyroidism; Arthralgia of hand; CRP elevated; ESR raised; Class 3 severe obesity due to excess calories without serious comorbidity in adult [facility]); Olfactory impairment; Memory loss; Physical deconditioning; Dyspnea on exertion; Neck pain, bilateral; Muscle spasms of neck; Vitamin D deficiency; COVID-19 long hauler manifesting chronic fatigue; Sleeps in sitting position due to orthopnea; Postviral fatigue syndrome; Chronic pain of both lower extremities; MDD (major depressive disorder), recurrent episode, mild (HCC); Neuropathy involving both lower extremities; Perennial allergic rhinitis with seasonal variation; and Strain of insertion of tendon of hamstring muscle on their problem list. She  has a past medical history of Abnormal thyroid blood test, Anemia, Anxiety, Asthma, Complication of anesthesia, COVID-19 (01/2020), Depression, Elevated serum glutamic pyruvic transaminase (SGPT) level, Graves disease, History of abnormal cervical Pap smear, History of cervical polypectomy, Hives (09/02/2015), Hypertension, Hypothyroidism,  IFG (impaired fasting glucose), Incidental lung nodule, > 3mm and < 8mm (03/31/2017), Low serum vitamin D, Obesity, PONV (postoperative nausea and vomiting), Pregnancy induced hypertension, Reflux, Thyroid disease, Vitamin B12 deficiency, Vitamin D deficiency disease, and Wears dentures.  has a past surgical history that includes Cervical polypectomy; Cesarean section; Colonoscopy with propofol (N/A, 11/14/2018); Esophagogastroduodenoscopy (egd) with propofol (N/A, 11/14/2018); and Givens capsule study (N/A, 12/27/2018) are also affecting patient's functional  outcome.   REHAB POTENTIAL: Poor due to nature and severity of condition as well as comorbid conditions.   CLINICAL DECISION MAKING: Stable/uncomplicated  EVALUATION COMPLEXITY: Low   GOALS: Goals reviewed with patient? No  SHORT TERM GOALS: Target date: 01/13/2023  Patient will be independent with initial home exercise program for self-management of symptoms. Baseline: Initial HEP to be provided at visit 2 as appropriate (12/30/22); Goal status: INITIAL   LONG TERM GOALS: Target date: 03/24/2023  Patient will be independent with a long-term home exercise program for self-management of symptoms.  Baseline: Initial HEP to be provided at visit 2 as appropriate (12/30/22); Goal status: INITIAL  2.  Patient will demonstrate improved FOTO by equal or greater than 10 points by visit #7 to demonstrate improvement in overall condition and self-reported functional ability.  Baseline: to be measured by visit 2 as appropriate (12/30/22); Goal status: INITIAL  3.  Patient will report decrease in pain during functional activity to equal or less than 3/10 to improve her ability to sleep and participate in usual activities with less difficulty.  Baseline: up to 10/10 (12/30/22); Goal status: INITIAL  4.  Patient will complete community, work and/or recreational activities with 50% less limitation due to current condition.  Baseline: difficulty  with prolonged standing, socializing, talking to people, jumping, running, generalized mobility, fatigue, shortness of breath, bending over, sleeping position, laying prone, any activity that requires increased oxygen demand (12/30/22); Goal status: INITIAL    PLAN:  PT FREQUENCY: 1x/week  PT DURATION: 6-12 weeks  PLANNED INTERVENTIONS: Therapeutic exercises, Therapeutic activity, Neuromuscular re-education, Patient/Family education, Self Care, Joint mobilization, Dry Needling, Electrical stimulation, Spinal mobilization, Cryotherapy, Moist heat, Manual therapy, and Re-evaluation.  PLAN FOR NEXT SESSION: complete FOTO, update HEP as appropriate, manual therapy and gentle strengthening and ROM exercises to improve postural endurance, neck/upper back, shoulder pain. Education.    Cira Rue, PT, DPT 12/30/2022, 8:33 PM   [facility] [address] [place], [place] [address] P: [phone] I F: [phone]

## 2022-12-25 ENCOUNTER — Telehealth: Payer: Self-pay | Admitting: Family Medicine

## 2022-12-25 NOTE — Telephone Encounter (Unsigned)
Copied from CRM [id number]. Topic: General - Inquiry >> Dec 25, 2022  1:03 PM De Blanch wrote: Reason for CRM: Marylene Land from [facility] Stated pt submitted an appeal for breast surgery, and additional medical records are needed. As stated on the 12th, they asked for exactly what was needed.  Appeal Case number: [id number] Please advise.
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
## 2022-12-28 ENCOUNTER — Ambulatory Visit: Payer: Medicaid Other | Admitting: Student

## 2022-12-28 ENCOUNTER — Ambulatory Visit: Payer: Medicaid Other | Admitting: Physician Assistant

## 2022-12-28 NOTE — Telephone Encounter (Signed)
Tried contacting and no answer

## 2022-12-28 NOTE — Progress Notes (Unsigned)
   Referring Provider [name], MD [address] [place],  [place] [address]   CC:  Chief Complaint  Patient presents with   Follow-up      Catherine Berg is an 42 y.o. female.  HPI: Patient is a 42 y.o. year old female here for follow up after completing physical therapy for pain related to macromastia.  She was seen for initial consult by Dr. Ladona Ridgel.  At that time, complained of significant back and neck discomfort in context of large breasts.  STN 42 cm on the right, 43 cm on the left.  Estimated excess tissue to be removed at time of surgery = [phone] g each breast.  Discussed possibility of free nipple graft.  Plan for follow up after PT.   Review of Systems General: Denies fevers MSK: Endorses ongoing back and neck discomfort Skin: Denies rashes ***  Physical Exam    12/24/2022   10:09 AM 12/14/2022    9:58 AM 11/09/2022   10:15 AM  Vitals with BMI  Height 5\' 4"  5\' 4"  5\' 3"   Weight 234 lbs 236 lbs 237 lbs 10 oz  BMI 40.15 40.49 42.1  Systolic 138 130 604  Diastolic 89 78 91  Pulse 71 93 70    General:  No acute distress,  Alert and oriented, Non-Toxic, Normal speech and affect Psych: Normal behavior and mood Respiratory: No increased WOB MSK: Ambulatory  Assessment/Plan ***  Patient is interested in pursuing surgical intervention for bilateral breast reduction. Patient has completed at least 6 weeks of physical therapy for pain related to macromastia.  Discussed with patient we would submit to insurance for authorization, discussed approval could take up to 6 weeks.   [name] 12/28/2022, 12:27 PM

## 2022-12-30 ENCOUNTER — Encounter: Payer: Self-pay | Admitting: Physical Therapy

## 2022-12-30 ENCOUNTER — Encounter: Payer: Self-pay | Admitting: Psychiatry

## 2022-12-30 ENCOUNTER — Telehealth (INDEPENDENT_AMBULATORY_CARE_PROVIDER_SITE_OTHER): Payer: Medicare Other | Admitting: Psychiatry

## 2022-12-30 ENCOUNTER — Ambulatory Visit: Payer: Medicare Other | Attending: Plastic Surgery | Admitting: Physical Therapy

## 2022-12-30 DIAGNOSIS — M25511 Pain in right shoulder: Secondary | ICD-10-CM | POA: Insufficient documentation

## 2022-12-30 DIAGNOSIS — M546 Pain in thoracic spine: Secondary | ICD-10-CM | POA: Diagnosis present

## 2022-12-30 DIAGNOSIS — F330 Major depressive disorder, recurrent, mild: Secondary | ICD-10-CM

## 2022-12-30 DIAGNOSIS — G4700 Insomnia, unspecified: Secondary | ICD-10-CM

## 2022-12-30 DIAGNOSIS — M542 Cervicalgia: Secondary | ICD-10-CM | POA: Diagnosis present

## 2022-12-30 DIAGNOSIS — F411 Generalized anxiety disorder: Secondary | ICD-10-CM

## 2022-12-30 DIAGNOSIS — N62 Hypertrophy of breast: Secondary | ICD-10-CM | POA: Diagnosis not present

## 2022-12-30 DIAGNOSIS — F4310 Post-traumatic stress disorder, unspecified: Secondary | ICD-10-CM

## 2022-12-30 DIAGNOSIS — G8929 Other chronic pain: Secondary | ICD-10-CM | POA: Diagnosis present

## 2022-12-30 DIAGNOSIS — M25512 Pain in left shoulder: Secondary | ICD-10-CM | POA: Insufficient documentation

## 2022-12-30 MED ORDER — DULOXETINE HCL 30 MG PO CPEP: 30.0000 mg | ORAL_CAPSULE | Freq: Every day | ORAL | 1 refills | Status: DC

## 2022-12-30 MED ORDER — TRAZODONE HCL 50 MG PO TABS: 50.0000 mg | ORAL_TABLET | Freq: Every evening | ORAL | 1 refills | Status: DC | PRN

## 2022-12-30 MED ORDER — HYDROXYZINE HCL 10 MG PO TABS: 10.0000 mg | ORAL_TABLET | Freq: Every day | ORAL | 1 refills | Status: DC | PRN

## 2022-12-30 NOTE — Patient Instructions (Signed)
Increase duloxetine 90 mg daily Continue trazodone 50 mg at night as needed for insomnia Start hydroxyzine 10 mg daily as needed for anxiety  Next appointment: 10/23 at 10 am

## 2023-01-01 ENCOUNTER — Telehealth: Payer: Self-pay | Admitting: Internal Medicine

## 2023-01-01 NOTE — Telephone Encounter (Signed)
We received a disability form from [employer] - but the form was too blurry to read.  I've called [employer] and asked that the form be faxed again to my attention at the [employer].

## 2023-01-06 ENCOUNTER — Ambulatory Visit: Payer: Medicare Other | Attending: Plastic Surgery

## 2023-01-06 DIAGNOSIS — M6281 Muscle weakness (generalized): Secondary | ICD-10-CM

## 2023-01-06 DIAGNOSIS — M25511 Pain in right shoulder: Secondary | ICD-10-CM | POA: Insufficient documentation

## 2023-01-06 DIAGNOSIS — G8929 Other chronic pain: Secondary | ICD-10-CM | POA: Diagnosis present

## 2023-01-06 DIAGNOSIS — M25512 Pain in left shoulder: Secondary | ICD-10-CM | POA: Insufficient documentation

## 2023-01-06 DIAGNOSIS — M542 Cervicalgia: Secondary | ICD-10-CM | POA: Diagnosis present

## 2023-01-06 DIAGNOSIS — M546 Pain in thoracic spine: Secondary | ICD-10-CM

## 2023-01-06 NOTE — Therapy (Signed)
OUTPATIENT PHYSICAL THERAPY TREATMENT    Patient Name: Catherine Berg MRN: [id number] DOB:09-Jun-1980, 42 y.o., female Today's Date: 01/06/2023  END OF SESSION:  PT End of Session - 01/06/23 1304     Visit Number 2    Number of Visits 7    Date for PT Re-Evaluation 03/24/23    Authorization Type MEDICARE PART B reporting period from 12/30/2022    Progress Note Due on Visit 10    PT Start Time 1300    PT Stop Time 1340    PT Time Calculation (min) 40 min    Equipment Utilized During Treatment Gait belt    Activity Tolerance Patient tolerated treatment well    Behavior During Therapy WFL for tasks assessed/performed             Past Medical History:  Diagnosis Date   Abnormal thyroid blood test    Anemia    Anxiety    Asthma    Complication of anesthesia    itching after c sections   COVID-19 01/2020   Depression    Elevated serum glutamic pyruvic transaminase (SGPT) level    Graves disease    History of abnormal cervical Pap smear    History of cervical polypectomy    Hives 09/02/2015   Hypertension    Hypothyroidism    IFG (impaired fasting glucose)    Incidental lung nodule, > 3mm and < 8mm 03/31/2017   4 mm RLL lung nodule on chest CT Mar 31, 2017   Low serum vitamin D    Obesity    PONV (postoperative nausea and vomiting)    after c section had vomit   Pregnancy induced hypertension    with 1st pregnancy, normal pressure with 2nd   Reflux    Thyroid disease    Vitamin B12 deficiency    Vitamin D deficiency disease    Wears dentures    full upper   Past Surgical History:  Procedure Laterality Date   CERVICAL POLYPECTOMY     CESAREAN SECTION     x 2   COLONOSCOPY WITH PROPOFOL N/A 11/14/2018   Procedure: COLONOSCOPY WITH PROPOFOL;  Surgeon: Pasty Spillers, MD;  Location: [facility];  Service: Endoscopy;  Laterality: N/A;   ESOPHAGOGASTRODUODENOSCOPY (EGD) WITH PROPOFOL N/A 11/14/2018   Procedure: ESOPHAGOGASTRODUODENOSCOPY (EGD) WITH  PROPOFOL;  Surgeon: Pasty Spillers, MD;  Location: [facility];  Service: Endoscopy;  Laterality: N/A;  Latex allergy   GIVENS CAPSULE STUDY N/A 12/27/2018   Procedure: GIVENS CAPSULE STUDY;  Surgeon: Pasty Spillers, MD;  Location: [facility];  Service: Endoscopy;  Laterality: N/A;   Patient Active Problem List   Diagnosis Date Noted   Strain of insertion of tendon of hamstring muscle 09/07/2022   MDD (major depressive disorder), recurrent episode, mild (HCC) 11/17/2021   Neuropathy involving both lower extremities 11/17/2021   Perennial allergic rhinitis with seasonal variation 11/17/2021   Chronic pain of both lower extremities 09/24/2020   Sleeps in sitting position due to orthopnea 07/02/2020   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   Vitamin D deficiency 04/22/2020   COVID-19 long hauler manifesting chronic fatigue 04/22/2020   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   Memory loss 02/28/2020   Physical deconditioning 02/28/2020   Dyspnea on exertion 02/28/2020   Neck pain, bilateral 02/28/2020   Muscle spasms of neck 02/28/2020   Arthralgia of hand 01/24/2019   CRP elevated 01/24/2019   ESR raised 01/24/2019   Class 3 severe obesity due to excess calories  without serious comorbidity in adult [facility]) 01/24/2019   Palpitations 09/16/2018   Coronary artery calcification 09/16/2018   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   Bilateral leg edema 04/20/2017   Incidental lung nodule, > 3mm and < 8mm 03/31/2017   Hives 09/02/2015   Morbid obesity (HCC) 08/15/2015   Anxiety 07/09/2015   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 06/28/2015   Thyroid nodule 02/01/2015   Vitamin B12 deficiency    History of abnormal cervical Pap smear    Allergy-induced asthma, mild intermittent, uncomplicated 10/09/2013    PCP: [name], MD  REFERRING PROVIDER: [name], MD  REFERRING DIAG: macromastia, chronic pain of both shoulders, chronic bilateral thoracic back  pain  Rationale for Evaluation and Treatment: Rehabilitation  THERAPY DIAG:  Pain in thoracic spine  Cervicalgia  Chronic pain of both shoulders  Muscle weakness (generalized)  ONSET DATE: prior to 2018  SUBJECTIVE:                                                                                                                                                                                           SUBJECTIVE STATEMENT: Pt doing well today, no updates since prior visit. Pt had 2 medical appointments earlier today, had labs drawn.    PERTINENT HISTORY:  Patient is a 42 y.o. female who presents to outpatient physical therapy with a referral for medical diagnosis macromastia, chronic pain of both shoulders, chronic bilateral thoracic back pain. This patient's chief complaints consist of chronic pain associated with large breasts including pain at the thoracic spine, base of neck, over bilateral upper traps and where bra straps cut into the tops of her shoulders, difficulty with breathing (related to weight on her chest and/or tightness of bras/garments to support her breasts), and difficulty with social interactions and function leading to the following functional deficits: difficulty with prolonged standing, socializing, talking to people, jumping, running, generalized mobility, fatigue, shortness of breath, bending over, sleeping position, laying prone, any activity that requires increased oxygen demand. Relevant past medical history and comorbidities include has Vitamin B12 deficiency; History of abnormal cervical Pap smear; Thyroid nodule; Allergy-induced asthma, mild intermittent, uncomplicated; 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; Anxiety; Morbid obesity (HCC); Hives; Incidental lung nodule, > 3mm and < 8mm; Bilateral leg edema; Palpitations; Coronary artery calcification; Postablative hypothyroidism; Arthralgia of hand; CRP elevated; ESR raised; Class 3 severe obesity due to excess calories  without serious comorbidity in adult [facility]); Olfactory impairment; Memory loss; Physical deconditioning; Dyspnea on exertion; Neck pain, bilateral; Muscle spasms of neck; Vitamin D deficiency; COVID-19 long hauler manifesting chronic fatigue; Sleeps in sitting position due to orthopnea; Postviral fatigue syndrome; Chronic pain of both  lower extremities; MDD (major depressive disorder), recurrent episode, mild (HCC); Neuropathy involving both lower extremities; Perennial allergic rhinitis with seasonal variation; and Strain of insertion of tendon of hamstring muscle on their problem list. She  has a past medical history of Abnormal thyroid blood test, Anemia, Anxiety, Asthma, Complication of anesthesia, COVID-19 (01/2020), Depression, Elevated serum glutamic pyruvic transaminase (SGPT) level, Graves disease, History of abnormal cervical Pap smear, History of cervical polypectomy, Hives (09/02/2015), Hypertension, Hypothyroidism, IFG (impaired fasting glucose), Incidental lung nodule, > 3mm and < 8mm (03/31/2017), Low serum vitamin D, Obesity, PONV (postoperative nausea and vomiting), Pregnancy induced hypertension, Reflux, Thyroid disease, Vitamin B12 deficiency, Vitamin D deficiency disease, and Wears dentures.  has a past surgical history that includes Cervical polypectomy; Cesarean section; Colonoscopy with propofol (N/A, 11/14/2018); Esophagogastroduodenoscopy (egd) with propofol (N/A, 11/14/2018); and Givens capsule study (N/A, 12/27/2018). Patient denies hx of cancer, stroke, seizures, diabetes, unexplained weight loss, unexplained changes in bowel or bladder problems, unexplained stumbling or dropping things, osteoporosis, and spinal surgery. Endorses asthma and heart palpitations.   PAIN:  Are you having pain?  Yes 6/10 Left upper trap area   FUNCTIONAL LIMITATIONS: difficulty with prolonged standing, socializing, talking to people, jumping, running, generalized mobility, fatigue, shortness of breath, bending  over, sleeping position, laying prone, any activity that requires increased oxygen demand.   LEISURE: I don't really have fun  PRECAUTIONS: post-exertional malaise,   WEIGHT BEARING RESTRICTIONS: No  FALLS:  Has patient fallen in last 6 months? Yes. Number of falls 1-6 Larey Seat out of the car 2 weeks ago, fell off the porch but unsure if it was within the last 6 months. She has neuropathy in her feet. She has been doing QUTENZA treatment in the clinic with Dr. Carlis Abbott.  LIVING ENVIRONMENT: Lives with: son and daughter (teens) Lives in: 1 story home  Stairs: 3 steps to enter with no handrail She does not use assistive devices.   OCCUPATION: on disability due to long COVID-19  PLOF: Independent  PATIENT GOALS: "to get better"  NEXT MD VISIT: 02/02/2023  OBJECTIVE   TODAY'S TREATMENT:  -UBE level 3, alternating directions X60 sec x4 minutes -Thoracic towel roll stretch x 10 minutes, concurrent hot pack to Left UT, concurrent FOTO survey (orally given) -BUE invisible wand flexion x25  -pin and stretch Left UT + Rt cervical rotation 2x10 with 2 different pinchings of Left UT to tolerance -seated in chair prayer stretch 3x40sec  -standing cable row Omega 1x15 @ 15lb  -standing bilat shoulder ABDCT lateral rases to 90 degrees  -seated chest press 1x6 @ 15lb, then 2 reps at 13lb, then 7 reps (to 15) at 10lb  *arms feeling pretty tired, but not more painful  Towel roll, wand flexion, and or band row would be good potential items for HEP next session.     PATIENT EDUCATION:  Education details: form for basic exercises today; tactile feedback used and biofeedback   HOME EXERCISE PROGRAM: TBA  ASSESSMENT:  CLINICAL IMPRESSION: Started with interventions to reduce painful spams and hypompobility as well as interventions to maximize joint mobility and load tolerance. Pt has no increased pain, but arms are fatigued by end of session. Will plan to give HEP next session.   OBJECTIVE  IMPAIRMENTS: decreased activity tolerance, decreased cognition, decreased endurance, decreased ROM, decreased strength, impaired perceived functional ability, increased muscle spasms, impaired UE functional use, improper body mechanics, postural dysfunction, obesity, and pain.   ACTIVITY LIMITATIONS: carrying, lifting, bending, sitting, standing, and sleeping  PARTICIPATION LIMITATIONS:  interpersonal relationship, shopping, community activity, and   difficulty with prolonged standing, socializing, talking to people, jumping, running, generalized mobility, fatigue, shortness of breath, bending over, sleeping position, laying prone, any activity that requires increased oxygen demand  PERSONAL FACTORS: Fitness, Past/current experiences, Time since onset of injury/illness/exacerbation, and 3+ comorbidities:   Vitamin B12 deficiency; History of abnormal cervical Pap smear; Thyroid nodule; Allergy-induced asthma, mild intermittent, uncomplicated; 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; Anxiety; Morbid obesity (HCC); Hives; Incidental lung nodule, > 3mm and < 8mm; Bilateral leg edema; Palpitations; Coronary artery calcification; Postablative hypothyroidism; Arthralgia of hand; CRP elevated; ESR raised; Class 3 severe obesity due to excess calories without serious comorbidity in adult [facility]); Olfactory impairment; Memory loss; Physical deconditioning; Dyspnea on exertion; Neck pain, bilateral; Muscle spasms of neck; Vitamin D deficiency; COVID-19 long hauler manifesting chronic fatigue; Sleeps in sitting position due to orthopnea; Postviral fatigue syndrome; Chronic pain of both lower extremities; MDD (major depressive disorder), recurrent episode, mild (HCC); Neuropathy involving both lower extremities; Perennial allergic rhinitis with seasonal variation; and Strain of insertion of tendon of hamstring muscle on their problem list. She  has a past medical history of Abnormal thyroid blood test, Anemia, Anxiety, Asthma,  Complication of anesthesia, COVID-19 (01/2020), Depression, Elevated serum glutamic pyruvic transaminase (SGPT) level, Graves disease, History of abnormal cervical Pap smear, History of cervical polypectomy, Hives (09/02/2015), Hypertension, Hypothyroidism, IFG (impaired fasting glucose), Incidental lung nodule, > 3mm and < 8mm (03/31/2017), Low serum vitamin D, Obesity, PONV (postoperative nausea and vomiting), Pregnancy induced hypertension, Reflux, Thyroid disease, Vitamin B12 deficiency, Vitamin D deficiency disease, and Wears dentures.  has a past surgical history that includes Cervical polypectomy; Cesarean section; Colonoscopy with propofol (N/A, 11/14/2018); Esophagogastroduodenoscopy (egd) with propofol (N/A, 11/14/2018); and Givens capsule study (N/A, 12/27/2018) are also affecting patient's functional outcome.   REHAB POTENTIAL: Poor due to nature and severity of condition as well as comorbid conditions.   CLINICAL DECISION MAKING: Stable/uncomplicated  EVALUATION COMPLEXITY: Low   GOALS: Goals reviewed with patient? No  SHORT TERM GOALS: Target date: 01/13/2023  Patient will be independent with initial home exercise program for self-management of symptoms. Baseline: Initial HEP to be provided at visit 2 as appropriate (12/30/22); Goal status: INITIAL   LONG TERM GOALS: Target date: 03/24/2023  Patient will be independent with a long-term home exercise program for self-management of symptoms.  Baseline: Initial HEP to be provided at visit 2 as appropriate (12/30/22); Goal status: INITIAL  2.  Patient will demonstrate improved FOTO by equal or greater than 10 points by visit #7 to demonstrate improvement in overall condition and self-reported functional ability.  Baseline: to be measured by visit 2 as appropriate (12/30/22); Goal status: INITIAL  3.  Patient will report decrease in pain during functional activity to equal or less than 3/10 to improve her ability to sleep and  participate in usual activities with less difficulty.  Baseline: up to 10/10 (12/30/22); Goal status: INITIAL  4.  Patient will complete community, work and/or recreational activities with 50% less limitation due to current condition.  Baseline: difficulty with prolonged standing, socializing, talking to people, jumping, running, generalized mobility, fatigue, shortness of breath, bending over, sleeping position, laying prone, any activity that requires increased oxygen demand (12/30/22); Goal status: INITIAL    PLAN:  PT FREQUENCY: 1x/week  PT DURATION: 6-12 weeks  PLANNED INTERVENTIONS: Therapeutic exercises, Therapeutic activity, Neuromuscular re-education, Patient/Family education, Self Care, Joint mobilization, Dry Needling, Electrical stimulation, Spinal mobilization, Cryotherapy, Moist heat, Manual therapy, and Re-evaluation.  PLAN FOR  NEXT SESSION: complete FOTO, update HEP as appropriate, manual therapy and gentle strengthening and ROM exercises to improve postural endurance, neck/upper back, shoulder pain. Education.    Rosamaria Lints, PT, DPT 01/06/2023, 1:21 PM   [facility] [address] [place], [place] [address] P: [phone] I F: [phone]

## 2023-01-11 ENCOUNTER — Telehealth: Payer: Self-pay | Admitting: Psychiatry

## 2023-01-11 ENCOUNTER — Ambulatory Visit: Payer: Medicare Other | Admitting: Physical Therapy

## 2023-01-11 ENCOUNTER — Encounter: Payer: Self-pay | Admitting: Physical Therapy

## 2023-01-11 DIAGNOSIS — M546 Pain in thoracic spine: Secondary | ICD-10-CM

## 2023-01-11 DIAGNOSIS — G8929 Other chronic pain: Secondary | ICD-10-CM

## 2023-01-11 DIAGNOSIS — M542 Cervicalgia: Secondary | ICD-10-CM

## 2023-01-11 NOTE — Telephone Encounter (Signed)
pt was notified . pt states that it was fine to wait.

## 2023-01-11 NOTE — Telephone Encounter (Signed)
Called [employer] and requested again that form be faxed to my attention.  Also asked that a note be put on the patient's account that the delay in processing is not her fault because we have asked for another copy of the form and not received it yet.  Rep said she put a note on the patient's account.

## 2023-01-11 NOTE — Telephone Encounter (Signed)
We received paperwork from [employer] regarding long-term disability.   Please inform her that I plan to complete this form after the next visit. My understanding is that she is on long-term disability due to a different condition. Since we did not discuss this in detail during her previous visit, it is challenging for me to complete the form accurately to support her.

## 2023-01-11 NOTE — Therapy (Signed)
OUTPATIENT PHYSICAL THERAPY TREATMENT    Patient Name: Catherine Berg MRN: [id number] DOB:Feb 11, 1981, 42 y.o., female Today's Date: 01/11/2023  END OF SESSION:  PT End of Session - 01/11/23 1312     Visit Number 3    Number of Visits 7    Date for PT Re-Evaluation 03/24/23    Authorization Type MEDICARE PART B reporting period from 12/30/2022    Progress Note Due on Visit 10    PT Start Time 1305    PT Stop Time 1343    PT Time Calculation (min) 38 min    Equipment Utilized During Treatment Gait belt    Activity Tolerance Patient tolerated treatment well    Behavior During Therapy WFL for tasks assessed/performed              Past Medical History:  Diagnosis Date   Abnormal thyroid blood test    Anemia    Anxiety    Asthma    Complication of anesthesia    itching after c sections   COVID-19 01/2020   Depression    Elevated serum glutamic pyruvic transaminase (SGPT) level    Graves disease    History of abnormal cervical Pap smear    History of cervical polypectomy    Hives 09/02/2015   Hypertension    Hypothyroidism    IFG (impaired fasting glucose)    Incidental lung nodule, > 3mm and < 8mm 03/31/2017   4 mm RLL lung nodule on chest CT Mar 31, 2017   Low serum vitamin D    Obesity    PONV (postoperative nausea and vomiting)    after c section had vomit   Pregnancy induced hypertension    with 1st pregnancy, normal pressure with 2nd   Reflux    Thyroid disease    Vitamin B12 deficiency    Vitamin D deficiency disease    Wears dentures    full upper   Past Surgical History:  Procedure Laterality Date   CERVICAL POLYPECTOMY     CESAREAN SECTION     x 2   COLONOSCOPY WITH PROPOFOL N/A 11/14/2018   Procedure: COLONOSCOPY WITH PROPOFOL;  Surgeon: Pasty Spillers, MD;  Location: [facility];  Service: Endoscopy;  Laterality: N/A;   ESOPHAGOGASTRODUODENOSCOPY (EGD) WITH PROPOFOL N/A 11/14/2018   Procedure: ESOPHAGOGASTRODUODENOSCOPY (EGD) WITH  PROPOFOL;  Surgeon: Pasty Spillers, MD;  Location: [facility];  Service: Endoscopy;  Laterality: N/A;  Latex allergy   GIVENS CAPSULE STUDY N/A 12/27/2018   Procedure: GIVENS CAPSULE STUDY;  Surgeon: Pasty Spillers, MD;  Location: [facility];  Service: Endoscopy;  Laterality: N/A;   Patient Active Problem List   Diagnosis Date Noted   Strain of insertion of tendon of hamstring muscle 09/07/2022   MDD (major depressive disorder), recurrent episode, mild (HCC) 11/17/2021   Neuropathy involving both lower extremities 11/17/2021   Perennial allergic rhinitis with seasonal variation 11/17/2021   Chronic pain of both lower extremities 09/24/2020   Sleeps in sitting position due to orthopnea 07/02/2020   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   Vitamin D deficiency 04/22/2020   COVID-19 long hauler manifesting chronic fatigue 04/22/2020   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   Memory loss 02/28/2020   Physical deconditioning 02/28/2020   Dyspnea on exertion 02/28/2020   Neck pain, bilateral 02/28/2020   Muscle spasms of neck 02/28/2020   Arthralgia of hand 01/24/2019   CRP elevated 01/24/2019   ESR raised 01/24/2019   Class 3 severe obesity due to excess  calories without serious comorbidity in adult [facility]) 01/24/2019   Palpitations 09/16/2018   Coronary artery calcification 09/16/2018   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   Bilateral leg edema 04/20/2017   Incidental lung nodule, > 3mm and < 8mm 03/31/2017   Hives 09/02/2015   Morbid obesity (HCC) 08/15/2015   Anxiety 07/09/2015   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 06/28/2015   Thyroid nodule 02/01/2015   Vitamin B12 deficiency    History of abnormal cervical Pap smear    Allergy-induced asthma, mild intermittent, uncomplicated 10/09/2013    PCP: [name], MD  REFERRING PROVIDER: [name], MD  REFERRING DIAG: macromastia, chronic pain of both shoulders, chronic bilateral thoracic back  pain  Rationale for Evaluation and Treatment: Rehabilitation  THERAPY DIAG:  Pain in thoracic spine  Cervicalgia  Chronic pain of both shoulders  ONSET DATE: prior to 2018  PERTINENT HISTORY:  Patient is a 42 y.o. female who presents to outpatient physical therapy with a referral for medical diagnosis macromastia, chronic pain of both shoulders, chronic bilateral thoracic back pain. This patient's chief complaints consist of chronic pain associated with large breasts including pain at the thoracic spine, base of neck, over bilateral upper traps and where bra straps cut into the tops of her shoulders, difficulty with breathing (related to weight on her chest and/or tightness of bras/garments to support her breasts), and difficulty with social interactions and function leading to the following functional deficits: difficulty with prolonged standing, socializing, talking to people, jumping, running, generalized mobility, fatigue, shortness of breath, bending over, sleeping position, laying prone, any activity that requires increased oxygen demand. Relevant past medical history and comorbidities include has Vitamin B12 deficiency; History of abnormal cervical Pap smear; Thyroid nodule; Allergy-induced asthma, mild intermittent, uncomplicated; 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; Anxiety; Morbid obesity (HCC); Hives; Incidental lung nodule, > 3mm and < 8mm; Bilateral leg edema; Palpitations; Coronary artery calcification; Postablative hypothyroidism; Arthralgia of hand; CRP elevated; ESR raised; Class 3 severe obesity due to excess calories without serious comorbidity in adult [facility]); Olfactory impairment; Memory loss; Physical deconditioning; Dyspnea on exertion; Neck pain, bilateral; Muscle spasms of neck; Vitamin D deficiency; COVID-19 long hauler manifesting chronic fatigue; Sleeps in sitting position due to orthopnea; Postviral fatigue syndrome; Chronic pain of both lower extremities; MDD (major depressive  disorder), recurrent episode, mild (HCC); Neuropathy involving both lower extremities; Perennial allergic rhinitis with seasonal variation; and Strain of insertion of tendon of hamstring muscle on their problem list. She  has a past medical history of Abnormal thyroid blood test, Anemia, Anxiety, Asthma, Complication of anesthesia, COVID-19 (01/2020), Depression, Elevated serum glutamic pyruvic transaminase (SGPT) level, Graves disease, History of abnormal cervical Pap smear, History of cervical polypectomy, Hives (09/02/2015), Hypertension, Hypothyroidism, IFG (impaired fasting glucose), Incidental lung nodule, > 3mm and < 8mm (03/31/2017), Low serum vitamin D, Obesity, PONV (postoperative nausea and vomiting), Pregnancy induced hypertension, Reflux, Thyroid disease, Vitamin B12 deficiency, Vitamin D deficiency disease, and Wears dentures.  has a past surgical history that includes Cervical polypectomy; Cesarean section; Colonoscopy with propofol (N/A, 11/14/2018); Esophagogastroduodenoscopy (egd) with propofol (N/A, 11/14/2018); and Givens capsule study (N/A, 12/27/2018). Patient denies hx of cancer, stroke, seizures, diabetes, unexplained weight loss, unexplained changes in bowel or bladder problems, unexplained stumbling or dropping things, osteoporosis, and spinal surgery. Endorses asthma and heart palpitations.   SUBJECTIVE:  SUBJECTIVE STATEMENT: Pt doing well today, no updates since prior visit. Pt had 2 medical appointments earlier today, had labs drawn.   PAIN:  Are you having pain?  Yes 5/10 Left upper trap area   PRECAUTIONS: post-exertional malaise   PLOF: Independent  PATIENT GOALS: "to get better"  NEXT MD VISIT: 02/02/2023  OBJECTIVE  TODAY'S TREATMENT:  Therapeutic exercise: to centralize symptoms and  improve ROM, strength, muscular endurance, and activity tolerance required for successful completion of functional activities.  - Sidelying open book (thoracic rotation) to improve thoracic, shoulder girdle, and upper trunk mobility. Required instruction for technique and cuing to achieve end range as tolerated, hold time, and breathing technique. 1x10 each side.  - trial of postural correction in sitting with towel roll at lumbar spine with education on varying posture throughout the day as well as trying to improve posture/body mechanics to decrease excessive load on upper back and upper traps due to forward flexion of spine. Patient reported mild improvement in comfort with seated position with lumbar roll.  - Education on HEP including handout  - education on pacing, purposefully doing something that easily feels good/enjoyable with low energy cost each day such as cuddling with her dog.   Manual therapy: to reduce pain and tissue tension, improve range of motion, neuromodulation, in order to promote improved ability to complete functional activities. HOOKLYING -STM to bilateral posterior cervical spine musculature, especially focusing on tender portions of B UT.    PATIENT EDUCATION:  Education details: form for basic exercises today; tactile feedback used and biofeedback   HOME EXERCISE PROGRAM: Access Code: [email or web address] URL: [email or web address] Date: 01/11/2023 Prepared by: Norton Blizzard  Exercises - Sidelying Thoracic Rotation with Open Book  - 1 x daily - 1-2 sets - 10 reps - 5 seconds hold - Seated Posture with Lumbar Roll   ASSESSMENT:  CLINICAL IMPRESSION: Patient arrives reporting increased fatigue after last two PT sessions. Today's session utilized gentle exercises and manual therapy to help decrease tension and pain in the upper trap region, as well as education and postural supports to encourage improved load management and pacing to support quality of life.  Patient continues to demonstrate signs of increased pain in the neck and upper back related to abnormally large breasts causing excessive mechanic stress during usual activities, complicated by long COVID19 chronic fatigue. Interventions kept low energy to help decrease potential for negative impact through post exertional malaise has been shown to occur on those with long COVID chronic fatigue/chronic fatigue syndrome and consistent with patient report. Patient would benefit from continued management of limiting condition by skilled physical therapist to address remaining impairments and functional limitations to work towards stated goals and return to PLOF or maximal functional independence.   OBJECTIVE IMPAIRMENTS: decreased activity tolerance, decreased cognition, decreased endurance, decreased ROM, decreased strength, impaired perceived functional ability, increased muscle spasms, impaired UE functional use, improper body mechanics, postural dysfunction, obesity, and pain.   ACTIVITY LIMITATIONS: carrying, lifting, bending, sitting, standing, and sleeping  PARTICIPATION LIMITATIONS: interpersonal relationship, shopping, community activity, and   difficulty with prolonged standing, socializing, talking to people, jumping, running, generalized mobility, fatigue, shortness of breath, bending over, sleeping position, laying prone, any activity that requires increased oxygen demand  PERSONAL FACTORS: Fitness, Past/current experiences, Time since onset of injury/illness/exacerbation, and 3+ comorbidities:   Vitamin B12 deficiency; History of abnormal cervical Pap smear; Thyroid nodule; Allergy-induced asthma, mild intermittent, uncomplicated; 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; Anxiety; Morbid obesity (HCC); Hives; Incidental lung nodule, > 3mm and <  8mm; Bilateral leg edema; Palpitations; Coronary artery calcification; Postablative hypothyroidism; Arthralgia of hand; CRP elevated; ESR raised; Class 3 severe  obesity due to excess calories without serious comorbidity in adult [facility]); Olfactory impairment; Memory loss; Physical deconditioning; Dyspnea on exertion; Neck pain, bilateral; Muscle spasms of neck; Vitamin D deficiency; COVID-19 long hauler manifesting chronic fatigue; Sleeps in sitting position due to orthopnea; Postviral fatigue syndrome; Chronic pain of both lower extremities; MDD (major depressive disorder), recurrent episode, mild (HCC); Neuropathy involving both lower extremities; Perennial allergic rhinitis with seasonal variation; and Strain of insertion of tendon of hamstring muscle on their problem list. She  has a past medical history of Abnormal thyroid blood test, Anemia, Anxiety, Asthma, Complication of anesthesia, COVID-19 (01/2020), Depression, Elevated serum glutamic pyruvic transaminase (SGPT) level, Graves disease, History of abnormal cervical Pap smear, History of cervical polypectomy, Hives (09/02/2015), Hypertension, Hypothyroidism, IFG (impaired fasting glucose), Incidental lung nodule, > 3mm and < 8mm (03/31/2017), Low serum vitamin D, Obesity, PONV (postoperative nausea and vomiting), Pregnancy induced hypertension, Reflux, Thyroid disease, Vitamin B12 deficiency, Vitamin D deficiency disease, and Wears dentures.  has a past surgical history that includes Cervical polypectomy; Cesarean section; Colonoscopy with propofol (N/A, 11/14/2018); Esophagogastroduodenoscopy (egd) with propofol (N/A, 11/14/2018); and Givens capsule study (N/A, 12/27/2018) are also affecting patient's functional outcome.   REHAB POTENTIAL: Poor due to nature and severity of condition as well as comorbid conditions.   CLINICAL DECISION MAKING: Stable/uncomplicated  EVALUATION COMPLEXITY: Low   GOALS: Goals reviewed with patient? No  SHORT TERM GOALS: Target date: 01/13/2023  Patient will be independent with initial home exercise program for self-management of symptoms. Baseline: Initial HEP to be provided at  visit 2 as appropriate (12/30/22); Goal status: In-progress   LONG TERM GOALS: Target date: 03/24/2023  Patient will be independent with a long-term home exercise program for self-management of symptoms.  Baseline: Initial HEP to be provided at visit 2 as appropriate (12/30/22); initial HEP provided at visit #3 (01/11/2023);  Goal status: In-progress  2.  Patient will demonstrate improved FOTO by equal or greater than 10 points by visit #7 to demonstrate improvement in overall condition and self-reported functional ability.  Baseline: to be measured by visit 2 as appropriate (12/30/22); 41 at visit #2 (01/06/2023);  Goal status: In-progress  3.  Patient will report decrease in pain during functional activity to equal or less than 3/10 to improve her ability to sleep and participate in usual activities with less difficulty.  Baseline: up to 10/10 (12/30/22); Goal status: In-progress  4.  Patient will complete community, work and/or recreational activities with 50% less limitation due to current condition.  Baseline: difficulty with prolonged standing, socializing, talking to people, jumping, running, generalized mobility, fatigue, shortness of breath, bending over, sleeping position, laying prone, any activity that requires increased oxygen demand (12/30/22); Goal status: In-progress    PLAN:  PT FREQUENCY: 1x/week  PT DURATION: 6-12 weeks  PLANNED INTERVENTIONS: Therapeutic exercises, Therapeutic activity, Neuromuscular re-education, Patient/Family education, Self Care, Joint mobilization, Dry Needling, Electrical stimulation, Spinal mobilization, Cryotherapy, Moist heat, Manual therapy, and Re-evaluation.  PLAN FOR NEXT SESSION: complete FOTO, update HEP as appropriate, manual therapy and gentle strengthening and ROM exercises to improve postural endurance, neck/upper back, shoulder pain. Education.    Cira Rue, PT, DPT 01/11/2023, 2:03 PM   [facility] [address] [place], [place] [address] P: [phone] I F: [phone]

## 2023-01-13 ENCOUNTER — Ambulatory Visit: Payer: Medicare Other | Admitting: Physical Therapy

## 2023-01-14 ENCOUNTER — Ambulatory Visit: Payer: Medicare Other

## 2023-01-15 NOTE — Telephone Encounter (Signed)
Forms printed and placed in Dr. Clovis Fredrickson folder for signature.

## 2023-01-15 NOTE — Telephone Encounter (Signed)
Received clear long term disability form from [employer].  Completed the form based on info from last form completed in Sept 2022 and notes from patient's appointment with Dr. Belia Heman on 06/23/22.  Emailed form to [employer] office to have Dr. Belia Heman sign next week.  Once form is signed, $29 processing fee will be added to account.

## 2023-01-19 ENCOUNTER — Ambulatory Visit: Payer: Medicare Other | Admitting: Physical Therapy

## 2023-01-19 ENCOUNTER — Encounter: Payer: Self-pay | Admitting: Physical Therapy

## 2023-01-19 DIAGNOSIS — M546 Pain in thoracic spine: Secondary | ICD-10-CM

## 2023-01-19 DIAGNOSIS — G8929 Other chronic pain: Secondary | ICD-10-CM

## 2023-01-19 DIAGNOSIS — M542 Cervicalgia: Secondary | ICD-10-CM

## 2023-01-19 NOTE — Therapy (Signed)
OUTPATIENT PHYSICAL THERAPY TREATMENT    Patient Name: Catherine Berg MRN: [id number] DOB:[birth year], 42 y.o., female Today's Date: 01/19/2023  END OF SESSION:  PT End of Session - 01/19/23 0911     Visit Number 4    Number of Visits 7    Date for PT Re-Evaluation 03/24/23    Authorization Type MEDICARE PART B reporting period from 12/30/2022    Progress Note Due on Visit 10    PT Start Time 0906    PT Stop Time 0950    PT Time Calculation (min) 44 min    Equipment Utilized During Treatment --    Activity Tolerance Patient tolerated treatment well    Behavior During Therapy [facility] for tasks assessed/performed               Past Medical History:  Diagnosis Date   Abnormal thyroid blood test    Anemia    Anxiety    Asthma    Complication of anesthesia    itching after c sections   COVID-19 01/2020   Depression    Elevated serum glutamic pyruvic transaminase (SGPT) level    Graves disease    History of abnormal cervical Pap smear    History of cervical polypectomy    Hives 09/02/2015   Hypertension    Hypothyroidism    IFG (impaired fasting glucose)    Incidental lung nodule, > 3mm and < 8mm 03/31/2017   4 mm RLL lung nodule on chest CT Mar 31, 2017   Low serum vitamin D    Obesity    PONV (postoperative nausea and vomiting)    after c section had vomit   Pregnancy induced hypertension    with 1st pregnancy, normal pressure with 2nd   Reflux    Thyroid disease    Vitamin B12 deficiency    Vitamin D deficiency disease    Wears dentures    full upper   Past Surgical History:  Procedure Laterality Date   CERVICAL POLYPECTOMY     CESAREAN SECTION     x 2   COLONOSCOPY WITH PROPOFOL N/A 11/14/2018   Procedure: COLONOSCOPY WITH PROPOFOL;  Surgeon: Pasty Spillers, MD;  Location: [facility];  Service: Endoscopy;  Laterality: N/A;   ESOPHAGOGASTRODUODENOSCOPY (EGD) WITH PROPOFOL N/A 11/14/2018   Procedure: ESOPHAGOGASTRODUODENOSCOPY (EGD) WITH  PROPOFOL;  Surgeon: Pasty Spillers, MD;  Location: [facility];  Service: Endoscopy;  Laterality: N/A;  Latex allergy   GIVENS CAPSULE STUDY N/A 12/27/2018   Procedure: GIVENS CAPSULE STUDY;  Surgeon: Pasty Spillers, MD;  Location: [facility];  Service: Endoscopy;  Laterality: N/A;   Patient Active Problem List   Diagnosis Date Noted   Strain of insertion of tendon of hamstring muscle 09/07/2022   MDD (major depressive disorder), recurrent episode, mild (HCC) 11/17/2021   Neuropathy involving both lower extremities 11/17/2021   Perennial allergic rhinitis with seasonal variation 11/17/2021   Chronic pain of both lower extremities 09/24/2020   Sleeps in sitting position due to orthopnea 07/02/2020   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   Vitamin D deficiency 04/22/2020   COVID-19 long hauler manifesting chronic fatigue 04/22/2020   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   Memory loss 02/28/2020   Physical deconditioning 02/28/2020   Dyspnea on exertion 02/28/2020   Neck pain, bilateral 02/28/2020   Muscle spasms of neck 02/28/2020   Arthralgia of hand 01/24/2019   CRP elevated 01/24/2019   ESR raised 01/24/2019   Class 3 severe obesity due to excess  calories without serious comorbidity in adult [facility]) 01/24/2019   Palpitations 09/16/2018   Coronary artery calcification 09/16/2018   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   Bilateral leg edema 04/20/2017   Incidental lung nodule, > 3mm and < 8mm 03/31/2017   Hives 09/02/2015   Morbid obesity (HCC) 08/15/2015   Anxiety 07/09/2015   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 06/28/2015   Thyroid nodule 02/01/2015   Vitamin B12 deficiency    History of abnormal cervical Pap smear    Allergy-induced asthma, mild intermittent, uncomplicated 10/09/2013    PCP: [name], MD  REFERRING PROVIDER: [name], MD  REFERRING DIAG: macromastia, chronic pain of both shoulders, chronic bilateral thoracic back  pain  Rationale for Evaluation and Treatment: Rehabilitation  THERAPY DIAG:  Pain in thoracic spine  Cervicalgia  Chronic pain of both shoulders  ONSET DATE: prior to 2018  PERTINENT HISTORY:  Patient is a 42 y.o. female who presents to outpatient physical therapy with a referral for medical diagnosis macromastia, chronic pain of both shoulders, chronic bilateral thoracic back pain. This patient's chief complaints consist of chronic pain associated with large breasts including pain at the thoracic spine, base of neck, over bilateral upper traps and where bra straps cut into the tops of her shoulders, difficulty with breathing (related to weight on her chest and/or tightness of bras/garments to support her breasts), and difficulty with social interactions and function leading to the following functional deficits: difficulty with prolonged standing, socializing, talking to people, jumping, running, generalized mobility, fatigue, shortness of breath, bending over, sleeping position, laying prone, any activity that requires increased oxygen demand. Relevant past medical history and comorbidities include has Vitamin B12 deficiency; History of abnormal cervical Pap smear; Thyroid nodule; Allergy-induced asthma, mild intermittent, uncomplicated; 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; Anxiety; Morbid obesity (HCC); Hives; Incidental lung nodule, > 3mm and < 8mm; Bilateral leg edema; Palpitations; Coronary artery calcification; Postablative hypothyroidism; Arthralgia of hand; CRP elevated; ESR raised; Class 3 severe obesity due to excess calories without serious comorbidity in adult [facility]); Olfactory impairment; Memory loss; Physical deconditioning; Dyspnea on exertion; Neck pain, bilateral; Muscle spasms of neck; Vitamin D deficiency; COVID-19 long hauler manifesting chronic fatigue; Sleeps in sitting position due to orthopnea; Postviral fatigue syndrome; Chronic pain of both lower extremities; MDD (major depressive  disorder), recurrent episode, mild (HCC); Neuropathy involving both lower extremities; Perennial allergic rhinitis with seasonal variation; and Strain of insertion of tendon of hamstring muscle on their problem list. She  has a past medical history of Abnormal thyroid blood test, Anemia, Anxiety, Asthma, Complication of anesthesia, COVID-19 (01/2020), Depression, Elevated serum glutamic pyruvic transaminase (SGPT) level, Graves disease, History of abnormal cervical Pap smear, History of cervical polypectomy, Hives (09/02/2015), Hypertension, Hypothyroidism, IFG (impaired fasting glucose), Incidental lung nodule, > 3mm and < 8mm (03/31/2017), Low serum vitamin D, Obesity, PONV (postoperative nausea and vomiting), Pregnancy induced hypertension, Reflux, Thyroid disease, Vitamin B12 deficiency, Vitamin D deficiency disease, and Wears dentures.  has a past surgical history that includes Cervical polypectomy; Cesarean section; Colonoscopy with propofol (N/A, 11/14/2018); Esophagogastroduodenoscopy (egd) with propofol (N/A, 11/14/2018); and Givens capsule study (N/A, 12/27/2018). Patient denies hx of cancer, stroke, seizures, diabetes, unexplained weight loss, unexplained changes in bowel or bladder problems, unexplained stumbling or dropping things, osteoporosis, and spinal surgery. Endorses asthma and heart palpitations.   SUBJECTIVE:  SUBJECTIVE STATEMENT: Patient states she is feeling tired today. She states she was sore for several days after last PT session. She thinks it was related to a combination of manual therapy and exercise that made her sore. She forgot to do her HEP.  She states today is one of those days she is struggling to keep pushing.   PAIN:  Are you having pain?  Yes 5/10 neck region.   PRECAUTIONS:  post-exertional malaise   PLOF: Independent  PATIENT GOALS: "to get better"  NEXT MD VISIT: 02/02/2023  OBJECTIVE  TODAY'S TREATMENT:  Therapeutic exercise: to centralize symptoms and improve ROM, strength, muscular endurance, and activity tolerance required for successful completion of functional activities.  - Upper body ergometer level 1 to encourage joint nutrition, warm tissue, induce analgesic effect of aerobic exercise, improve muscular strength and endurance,  and prepare for remainder of session. 2 sets of one min each, forwards/backwards/rest.  - seated row with scapular retraction at OMEGA machine, 3x10 with 1 min rest between sets, 10#. - seated upper trap and levator scap stretch, 3x30 seconds each side for each stretch.  - Sidelying open book (thoracic rotation) to improve thoracic, shoulder girdle, and upper trunk mobility. 1x10 each side.  - seated B shoulder ER with palms up against RTB, 1x10 - Education on HEP including handout   PATIENT EDUCATION:  Education details: form for basic exercises today; tactile feedback used and biofeedback   HOME EXERCISE PROGRAM: Access Code: [email or web address] URL: [email or web address] Date: 01/19/2023 Prepared by: Norton Blizzard  Exercises - Seated Upper Trapezius Stretch  - 1 x daily - 3 reps - 30 seconds hold - Seated Levator Scapulae Stretch  - 1 x daily - 3 reps - 30 seconds hold - Sidelying Thoracic Rotation with Open Book  - 1 x daily - 1-2 sets - 10 reps - 5 seconds hold - Shoulder External Rotation and Scapular Retraction with Resistance  - 3-6 x weekly - 3 sets - 10 reps - Seated Posture with Lumbar Roll   ASSESSMENT:  CLINICAL IMPRESSION: Patient arrives continuing to report significant fatigue and soreness after last PT session and no improvement in symptoms. Physical therapy does not appear to be having a positive impact on patient and actually decreases her quality of life due to her chronic fatigue and  accompanying post-exertional malaise which drains her energy whenever she has to make the trip to PT or engages in exercise. Physical therapy continues to be unable to adequately address the underlying cause of her pain: her large breasts. Plan to continue PT per insurance company requirements to complete 6 weeks of physical therapy before determining it is not sufficient to improve patient's condition.   OBJECTIVE IMPAIRMENTS: decreased activity tolerance, decreased cognition, decreased endurance, decreased ROM, decreased strength, impaired perceived functional ability, increased muscle spasms, impaired UE functional use, improper body mechanics, postural dysfunction, obesity, and pain.   ACTIVITY LIMITATIONS: carrying, lifting, bending, sitting, standing, and sleeping  PARTICIPATION LIMITATIONS: interpersonal relationship, shopping, community activity, and   difficulty with prolonged standing, socializing, talking to people, jumping, running, generalized mobility, fatigue, shortness of breath, bending over, sleeping position, laying prone, any activity that requires increased oxygen demand  PERSONAL FACTORS: Fitness, Past/current experiences, Time since onset of injury/illness/exacerbation, and 3+ comorbidities:   Vitamin B12 deficiency; History of abnormal cervical Pap smear; Thyroid nodule; Allergy-induced asthma, mild intermittent, uncomplicated; 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; Anxiety; Morbid obesity (HCC); Hives; Incidental lung nodule, > 3mm and < 8mm; Bilateral leg edema; Palpitations; Coronary artery calcification;  Postablative hypothyroidism; Arthralgia of hand; CRP elevated; ESR raised; Class 3 severe obesity due to excess calories without serious comorbidity in adult [facility]); Olfactory impairment; Memory loss; Physical deconditioning; Dyspnea on exertion; Neck pain, bilateral; Muscle spasms of neck; Vitamin D deficiency; COVID-19 long hauler manifesting chronic fatigue; Sleeps in sitting position  due to orthopnea; Postviral fatigue syndrome; Chronic pain of both lower extremities; MDD (major depressive disorder), recurrent episode, mild (HCC); Neuropathy involving both lower extremities; Perennial allergic rhinitis with seasonal variation; and Strain of insertion of tendon of hamstring muscle on their problem list. She  has a past medical history of Abnormal thyroid blood test, Anemia, Anxiety, Asthma, Complication of anesthesia, COVID-19 (01/2020), Depression, Elevated serum glutamic pyruvic transaminase (SGPT) level, Graves disease, History of abnormal cervical Pap smear, History of cervical polypectomy, Hives (09/02/2015), Hypertension, Hypothyroidism, IFG (impaired fasting glucose), Incidental lung nodule, > 3mm and < 8mm (03/31/2017), Low serum vitamin D, Obesity, PONV (postoperative nausea and vomiting), Pregnancy induced hypertension, Reflux, Thyroid disease, Vitamin B12 deficiency, Vitamin D deficiency disease, and Wears dentures.  has a past surgical history that includes Cervical polypectomy; Cesarean section; Colonoscopy with propofol (N/A, 11/14/2018); Esophagogastroduodenoscopy (egd) with propofol (N/A, 11/14/2018); and Givens capsule study (N/A, 12/27/2018) are also affecting patient's functional outcome.   REHAB POTENTIAL: Poor due to nature and severity of condition as well as comorbid conditions.   CLINICAL DECISION MAKING: Stable/uncomplicated  EVALUATION COMPLEXITY: Low   GOALS: Goals reviewed with patient? No  SHORT TERM GOALS: Target date: 01/13/2023  Patient will be independent with initial home exercise program for self-management of symptoms. Baseline: Initial HEP to be provided at visit 2 as appropriate (12/30/22); Goal status: In-progress   LONG TERM GOALS: Target date: 03/24/2023  Patient will be independent with a long-term home exercise program for self-management of symptoms.  Baseline: Initial HEP to be provided at visit 2 as appropriate (12/30/22); initial HEP  provided at visit #3 (01/11/2023);  Goal status: In-progress  2.  Patient will demonstrate improved FOTO by equal or greater than 10 points by visit #7 to demonstrate improvement in overall condition and self-reported functional ability.  Baseline: to be measured by visit 2 as appropriate (12/30/22); 41 at visit #2 (01/06/2023);  Goal status: In-progress  3.  Patient will report decrease in pain during functional activity to equal or less than 3/10 to improve her ability to sleep and participate in usual activities with less difficulty.  Baseline: up to 10/10 (12/30/22); 8/10 (01/19/2023);  Goal status: In-progress  4.  Patient will complete community, work and/or recreational activities with 50% less limitation due to current condition.  Baseline: difficulty with prolonged standing, socializing, talking to people, jumping, running, generalized mobility, fatigue, shortness of breath, bending over, sleeping position, laying prone, any activity that requires increased oxygen demand (12/30/22); no change (01/19/2023);  Goal status: on-going    PLAN:  PT FREQUENCY: 1x/week  PT DURATION: 6-12 weeks  PLANNED INTERVENTIONS: Therapeutic exercises, Therapeutic activity, Neuromuscular re-education, Patient/Family education, Self Care, Joint mobilization, Dry Needling, Electrical stimulation, Spinal mobilization, Cryotherapy, Moist heat, Manual therapy, and Re-evaluation.  PLAN FOR NEXT SESSION: complete FOTO, update HEP as appropriate, manual therapy and gentle strengthening and ROM exercises to improve postural endurance, neck/upper back, shoulder pain. Education.    Cira Rue, PT, DPT 01/19/2023, 3:22 PM   [facility] [address] [place], [place] [address] P: [phone] I F: [phone]

## 2023-01-21 ENCOUNTER — Encounter: Payer: Medicaid Other | Admitting: Physical Therapy

## 2023-01-21 DIAGNOSIS — S93501A Unspecified sprain of right great toe, initial encounter: Secondary | ICD-10-CM | POA: Insufficient documentation

## 2023-01-21 HISTORY — DX: Unspecified sprain of right great toe, initial encounter: S93.501A

## 2023-01-25 ENCOUNTER — Encounter: Payer: Self-pay | Admitting: Physical Therapy

## 2023-01-25 ENCOUNTER — Ambulatory Visit: Payer: Medicare Other | Admitting: Physical Therapy

## 2023-01-25 DIAGNOSIS — M546 Pain in thoracic spine: Secondary | ICD-10-CM

## 2023-01-25 DIAGNOSIS — M542 Cervicalgia: Secondary | ICD-10-CM

## 2023-01-25 DIAGNOSIS — G8929 Other chronic pain: Secondary | ICD-10-CM

## 2023-01-25 NOTE — Therapy (Addendum)
OUTPATIENT PHYSICAL THERAPY TREATMENT    Patient Name: Catherine Berg MRN: [id number] DOB:1980/08/16, 42 y.o., female Today's Date: 01/25/2023  END OF SESSION:  PT End of Session - 01/25/23 1308     Visit Number 5    Number of Visits 7    Date for PT Re-Evaluation 03/24/23    Authorization Type MEDICARE PART B reporting period from 12/30/2022    Progress Note Due on Visit 10    PT Start Time 1305    PT Stop Time 1343    PT Time Calculation (min) 38 min    Activity Tolerance Patient tolerated treatment well    Behavior During Therapy [facility] for tasks assessed/performed             Past Medical History:  Diagnosis Date   Abnormal thyroid blood test    Anemia    Anxiety    Asthma    Complication of anesthesia    itching after c sections   COVID-19 01/2020   Depression    Elevated serum glutamic pyruvic transaminase (SGPT) level    Graves disease    History of abnormal cervical Pap smear    History of cervical polypectomy    Hives 09/02/2015   Hypertension    Hypothyroidism    IFG (impaired fasting glucose)    Incidental lung nodule, > 3mm and < 8mm 03/31/2017   4 mm RLL lung nodule on chest CT Mar 31, 2017   Low serum vitamin D    Obesity    PONV (postoperative nausea and vomiting)    after c section had vomit   Pregnancy induced hypertension    with 1st pregnancy, normal pressure with 2nd   Reflux    Thyroid disease    Vitamin B12 deficiency    Vitamin D deficiency disease    Wears dentures    full upper   Past Surgical History:  Procedure Laterality Date   CERVICAL POLYPECTOMY     CESAREAN SECTION     x 2   COLONOSCOPY WITH PROPOFOL N/A 11/14/2018   Procedure: COLONOSCOPY WITH PROPOFOL;  Surgeon: Pasty Spillers, MD;  Location: [facility];  Service: Endoscopy;  Laterality: N/A;   ESOPHAGOGASTRODUODENOSCOPY (EGD) WITH PROPOFOL N/A 11/14/2018   Procedure: ESOPHAGOGASTRODUODENOSCOPY (EGD) WITH PROPOFOL;  Surgeon: Pasty Spillers, MD;   Location: [facility];  Service: Endoscopy;  Laterality: N/A;  Latex allergy   GIVENS CAPSULE STUDY N/A 12/27/2018   Procedure: GIVENS CAPSULE STUDY;  Surgeon: Pasty Spillers, MD;  Location: [facility];  Service: Endoscopy;  Laterality: N/A;   Patient Active Problem List   Diagnosis Date Noted   Strain of insertion of tendon of hamstring muscle 09/07/2022   MDD (major depressive disorder), recurrent episode, mild (HCC) 11/17/2021   Neuropathy involving both lower extremities 11/17/2021   Perennial allergic rhinitis with seasonal variation 11/17/2021   Chronic pain of both lower extremities 09/24/2020   Sleeps in sitting position due to orthopnea 07/02/2020   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   Vitamin D deficiency 04/22/2020   COVID-19 long hauler manifesting chronic fatigue 04/22/2020   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   Memory loss 02/28/2020   Physical deconditioning 02/28/2020   Dyspnea on exertion 02/28/2020   Neck pain, bilateral 02/28/2020   Muscle spasms of neck 02/28/2020   Arthralgia of hand 01/24/2019   CRP elevated 01/24/2019   ESR raised 01/24/2019   Class 3 severe obesity due to excess calories without serious comorbidity in adult [facility]) 01/24/2019  Palpitations 09/16/2018   Coronary artery calcification 09/16/2018   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   Bilateral leg edema 04/20/2017   Incidental lung nodule, > 3mm and < 8mm 03/31/2017   Hives 09/02/2015   Morbid obesity (HCC) 08/15/2015   Anxiety 07/09/2015   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 06/28/2015   Thyroid nodule 02/01/2015   Vitamin B12 deficiency    History of abnormal cervical Pap smear    Allergy-induced asthma, mild intermittent, uncomplicated 10/09/2013    PCP: [name], MD  REFERRING PROVIDER: [name], MD  REFERRING DIAG: macromastia, chronic pain of both shoulders, chronic bilateral thoracic back pain  Rationale for Evaluation and Treatment:  Rehabilitation  THERAPY DIAG:  Pain in thoracic spine  Cervicalgia  Chronic pain of both shoulders  ONSET DATE: prior to 2018  PERTINENT HISTORY:  Patient is a 42 y.o. female who presents to outpatient physical therapy with a referral for medical diagnosis macromastia, chronic pain of both shoulders, chronic bilateral thoracic back pain. This patient's chief complaints consist of chronic pain associated with large breasts including pain at the thoracic spine, base of neck, over bilateral upper traps and where bra straps cut into the tops of her shoulders, difficulty with breathing (related to weight on her chest and/or tightness of bras/garments to support her breasts), and difficulty with social interactions and function leading to the following functional deficits: difficulty with prolonged standing, socializing, talking to people, jumping, running, generalized mobility, fatigue, shortness of breath, bending over, sleeping position, laying prone, any activity that requires increased oxygen demand. Relevant past medical history and comorbidities include has Vitamin B12 deficiency; History of abnormal cervical Pap smear; Thyroid nodule; Allergy-induced asthma, mild intermittent, uncomplicated; 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; Anxiety; Morbid obesity (HCC); Hives; Incidental lung nodule, > 3mm and < 8mm; Bilateral leg edema; Palpitations; Coronary artery calcification; Postablative hypothyroidism; Arthralgia of hand; CRP elevated; ESR raised; Class 3 severe obesity due to excess calories without serious comorbidity in adult [facility]); Olfactory impairment; Memory loss; Physical deconditioning; Dyspnea on exertion; Neck pain, bilateral; Muscle spasms of neck; Vitamin D deficiency; COVID-19 long hauler manifesting chronic fatigue; Sleeps in sitting position due to orthopnea; Postviral fatigue syndrome; Chronic pain of both lower extremities; MDD (major depressive disorder), recurrent episode, mild (HCC);  Neuropathy involving both lower extremities; Perennial allergic rhinitis with seasonal variation; and Strain of insertion of tendon of hamstring muscle on their problem list. She  has a past medical history of Abnormal thyroid blood test, Anemia, Anxiety, Asthma, Complication of anesthesia, COVID-19 (01/2020), Depression, Elevated serum glutamic pyruvic transaminase (SGPT) level, Graves disease, History of abnormal cervical Pap smear, History of cervical polypectomy, Hives (09/02/2015), Hypertension, Hypothyroidism, IFG (impaired fasting glucose), Incidental lung nodule, > 3mm and < 8mm (03/31/2017), Low serum vitamin D, Obesity, PONV (postoperative nausea and vomiting), Pregnancy induced hypertension, Reflux, Thyroid disease, Vitamin B12 deficiency, Vitamin D deficiency disease, and Wears dentures.  has a past surgical history that includes Cervical polypectomy; Cesarean section; Colonoscopy with propofol (N/A, 11/14/2018); Esophagogastroduodenoscopy (egd) with propofol (N/A, 11/14/2018); and Givens capsule study (N/A, 12/27/2018). Patient denies hx of cancer, stroke, seizures, diabetes, unexplained weight loss, unexplained changes in bowel or bladder problems, unexplained stumbling or dropping things, osteoporosis, and spinal surgery. Endorses asthma and heart palpitations.   SUBJECTIVE:  SUBJECTIVE STATEMENT: Patient states she is feeling extra sleepy today for no apparent reason. She states her pain has not changed. She continues to have pain over bilateral UT. She has done her HEP 3 times since last PT session with no effect. She was a little less sore after last PT session compared to the previous PT sessions.   PAIN:  Are you having pain?  Yes 6/10 neck region.   PRECAUTIONS: post-exertional malaise   PLOF:  Independent  PATIENT GOALS: "to get better"  NEXT MD VISIT: 02/02/2023  OBJECTIVE  TODAY'S TREATMENT:  Therapeutic exercise: to centralize symptoms and improve ROM, strength, muscular endurance, and activity tolerance required for successful completion of functional activities.  - Upper body ergometer level 1 to encourage joint nutrition, warm tissue, induce analgesic effect of aerobic exercise, improve muscular strength and endurance,  and prepare for remainder of session. 5 minutes, changing directions every 1 minute.  (Manual therapy - see below) - Sidelying open book (thoracic rotation) to improve thoracic, shoulder girdle, and upper trunk mobility. 1x10 each side.  - seated B shoulder ER with palms up against RTB, 1x10 - Education on HEP including handout   Manual therapy: to reduce pain and tissue tension, improve range of motion, neuromodulation, in order to promote improved ability to complete functional activities. HOOKLYING - STM to posterior cervical spine musculature  Pt required multimodal cuing for proper technique and to facilitate improved neuromuscular control, strength, range of motion, and functional ability resulting in improved performance and form.  PATIENT EDUCATION:  Education details: form for basic exercises today; tactile feedback used and biofeedback   HOME EXERCISE PROGRAM: Access Code: [email or web address] URL: [email or web address] Date: 01/19/2023 Prepared by: Norton Blizzard  Exercises - Seated Upper Trapezius Stretch  - 1 x daily - 3 reps - 30 seconds hold - Seated Levator Scapulae Stretch  - 1 x daily - 3 reps - 30 seconds hold - Sidelying Thoracic Rotation with Open Book  - 1 x daily - 1-2 sets - 10 reps - 5 seconds hold - Shoulder External Rotation and Scapular Retraction with Resistance  - 3-6 x weekly - 3 sets - 10 reps - Seated Posture with Lumbar Roll   ASSESSMENT:  CLINICAL IMPRESSION: Patient arrives extra fatigued today. She continues  to have no relief from PT at home or in the clinic. Today's session included manual therapy to help decrease symptoms at least temporarily. She tolerated it well but reported no change in symptoms by end of session. Physical therapy continues to be an inappropriate interventions for macromastia and is not reccommended for her current condition due to her difficulty with post-exertional malaise, which causes decreased quality of life from the energy expended coming to PT appointments. Plan to continue PT due to insurance requirement to attempt it for 6 weeks prior to considering surgical intervention.  OBJECTIVE IMPAIRMENTS: decreased activity tolerance, decreased cognition, decreased endurance, decreased ROM, decreased strength, impaired perceived functional ability, increased muscle spasms, impaired UE functional use, improper body mechanics, postural dysfunction, obesity, and pain.   ACTIVITY LIMITATIONS: carrying, lifting, bending, sitting, standing, and sleeping  PARTICIPATION LIMITATIONS: interpersonal relationship, shopping, community activity, and   difficulty with prolonged standing, socializing, talking to people, jumping, running, generalized mobility, fatigue, shortness of breath, bending over, sleeping position, laying prone, any activity that requires increased oxygen demand  PERSONAL FACTORS: Fitness, Past/current experiences, Time since onset of injury/illness/exacerbation, and 3+ comorbidities:   Vitamin B12 deficiency; History of abnormal cervical Pap smear; Thyroid nodule; Allergy-induced asthma, mild  intermittent, uncomplicated; 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; Anxiety; Morbid obesity (HCC); Hives; Incidental lung nodule, > 3mm and < 8mm; Bilateral leg edema; Palpitations; Coronary artery calcification; Postablative hypothyroidism; Arthralgia of hand; CRP elevated; ESR raised; Class 3 severe obesity due to excess calories without serious comorbidity in adult [facility]); Olfactory impairment;  Memory loss; Physical deconditioning; Dyspnea on exertion; Neck pain, bilateral; Muscle spasms of neck; Vitamin D deficiency; COVID-19 long hauler manifesting chronic fatigue; Sleeps in sitting position due to orthopnea; Postviral fatigue syndrome; Chronic pain of both lower extremities; MDD (major depressive disorder), recurrent episode, mild (HCC); Neuropathy involving both lower extremities; Perennial allergic rhinitis with seasonal variation; and Strain of insertion of tendon of hamstring muscle on their problem list. She  has a past medical history of Abnormal thyroid blood test, Anemia, Anxiety, Asthma, Complication of anesthesia, COVID-19 (01/2020), Depression, Elevated serum glutamic pyruvic transaminase (SGPT) level, Graves disease, History of abnormal cervical Pap smear, History of cervical polypectomy, Hives (09/02/2015), Hypertension, Hypothyroidism, IFG (impaired fasting glucose), Incidental lung nodule, > 3mm and < 8mm (03/31/2017), Low serum vitamin D, Obesity, PONV (postoperative nausea and vomiting), Pregnancy induced hypertension, Reflux, Thyroid disease, Vitamin B12 deficiency, Vitamin D deficiency disease, and Wears dentures.  has a past surgical history that includes Cervical polypectomy; Cesarean section; Colonoscopy with propofol (N/A, 11/14/2018); Esophagogastroduodenoscopy (egd) with propofol (N/A, 11/14/2018); and Givens capsule study (N/A, 12/27/2018) are also affecting patient's functional outcome.   REHAB POTENTIAL: Poor due to nature and severity of condition as well as comorbid conditions.   CLINICAL DECISION MAKING: Stable/uncomplicated  EVALUATION COMPLEXITY: Low   GOALS: Goals reviewed with patient? No  SHORT TERM GOALS: Target date: 01/13/2023  Patient will be independent with initial home exercise program for self-management of symptoms. Baseline: Initial HEP to be provided at visit 2 as appropriate (12/30/22); Goal status: In-progress   LONG TERM GOALS: Target date:  03/24/2023  Patient will be independent with a long-term home exercise program for self-management of symptoms.  Baseline: Initial HEP to be provided at visit 2 as appropriate (12/30/22); initial HEP provided at visit #3 (01/11/2023);  Goal status: In-progress  2.  Patient will demonstrate improved FOTO by equal or greater than 10 points by visit #7 to demonstrate improvement in overall condition and self-reported functional ability.  Baseline: to be measured by visit 2 as appropriate (12/30/22); 41 at visit #2 (01/06/2023);  Goal status: In-progress  3.  Patient will report decrease in pain during functional activity to equal or less than 3/10 to improve her ability to sleep and participate in usual activities with less difficulty.  Baseline: up to 10/10 (12/30/22); 8/10 (01/19/2023);  Goal status: In-progress  4.  Patient will complete community, work and/or recreational activities with 50% less limitation due to current condition.  Baseline: difficulty with prolonged standing, socializing, talking to people, jumping, running, generalized mobility, fatigue, shortness of breath, bending over, sleeping position, laying prone, any activity that requires increased oxygen demand (12/30/22); no change (01/19/2023);  Goal status: on-going    PLAN:  PT FREQUENCY: 1x/week  PT DURATION: 6-12 weeks  PLANNED INTERVENTIONS: Therapeutic exercises, Therapeutic activity, Neuromuscular re-education, Patient/Family education, Self Care, Joint mobilization, Dry Needling, Electrical stimulation, Spinal mobilization, Cryotherapy, Moist heat, Manual therapy, and Re-evaluation.  PLAN FOR NEXT SESSION: complete FOTO, update HEP as appropriate, manual therapy and gentle strengthening and ROM exercises to improve postural endurance, neck/upper back, shoulder pain. Education.    Cira Rue, PT, DPT 01/25/2023, 1:54 PM   Addendum to correct error in objective section.  Luretha Murphy. Ilsa Iha, PT, DPT 02/01/23, 1:27  PM  [facility] [address] [place], [place] [address] P: [phone] I F: [phone]

## 2023-01-26 DIAGNOSIS — Z0289 Encounter for other administrative examinations: Secondary | ICD-10-CM

## 2023-01-26 NOTE — Telephone Encounter (Signed)
Dr. Belia Heman signed the disability form and I have faxed it to [facility]  fax# [phone].   [phone] processing fee has been added to the chart and hard copy was mailed to the patient.

## 2023-01-28 ENCOUNTER — Encounter: Payer: Medicaid Other | Admitting: Physical Therapy

## 2023-02-01 ENCOUNTER — Ambulatory Visit: Payer: Medicare Other | Admitting: Physical Therapy

## 2023-02-01 ENCOUNTER — Encounter: Payer: Self-pay | Admitting: Physical Therapy

## 2023-02-01 DIAGNOSIS — G8929 Other chronic pain: Secondary | ICD-10-CM

## 2023-02-01 DIAGNOSIS — M546 Pain in thoracic spine: Secondary | ICD-10-CM

## 2023-02-01 DIAGNOSIS — M542 Cervicalgia: Secondary | ICD-10-CM

## 2023-02-01 NOTE — Therapy (Signed)
OUTPATIENT PHYSICAL THERAPY TREATMENT / PROGRESS NOTE Dates of reporting from 12/30/2022 to 02/01/2023   Patient Name: Catherine Berg MRN: [id number] DOB:1981/01/04, 42 y.o., female Today's Date: 02/01/2023  END OF SESSION:  PT End of Session - 02/01/23 1316     Visit Number 6    Number of Visits 7    Date for PT Re-Evaluation 03/24/23    Authorization Type MEDICARE PART B reporting period from 12/30/2022    Progress Note Due on Visit 7    PT Start Time 1305    PT Stop Time 1340    PT Time Calculation (min) 35 min    Activity Tolerance Patient limited by fatigue    Behavior During Therapy [facility] for tasks assessed/performed              Past Medical History:  Diagnosis Date   Abnormal thyroid blood test    Anemia    Anxiety    Asthma    Complication of anesthesia    itching after c sections   COVID-19 01/2020   Depression    Elevated serum glutamic pyruvic transaminase (SGPT) level    Graves disease    History of abnormal cervical Pap smear    History of cervical polypectomy    Hives 09/02/2015   Hypertension    Hypothyroidism    IFG (impaired fasting glucose)    Incidental lung nodule, > 3mm and < 8mm 03/31/2017   4 mm RLL lung nodule on chest CT Mar 31, 2017   Low serum vitamin D    Obesity    PONV (postoperative nausea and vomiting)    after c section had vomit   Pregnancy induced hypertension    with 1st pregnancy, normal pressure with 2nd   Reflux    Thyroid disease    Vitamin B12 deficiency    Vitamin D deficiency disease    Wears dentures    full upper   Past Surgical History:  Procedure Laterality Date   CERVICAL POLYPECTOMY     CESAREAN SECTION     x 2   COLONOSCOPY WITH PROPOFOL N/A 11/14/2018   Procedure: COLONOSCOPY WITH PROPOFOL;  Surgeon: Pasty Spillers, MD;  Location: [facility];  Service: Endoscopy;  Laterality: N/A;   ESOPHAGOGASTRODUODENOSCOPY (EGD) WITH PROPOFOL N/A 11/14/2018   Procedure: ESOPHAGOGASTRODUODENOSCOPY (EGD)  WITH PROPOFOL;  Surgeon: Pasty Spillers, MD;  Location: [facility];  Service: Endoscopy;  Laterality: N/A;  Latex allergy   GIVENS CAPSULE STUDY N/A 12/27/2018   Procedure: GIVENS CAPSULE STUDY;  Surgeon: Pasty Spillers, MD;  Location: [facility];  Service: Endoscopy;  Laterality: N/A;   Patient Active Problem List   Diagnosis Date Noted   Strain of insertion of tendon of hamstring muscle 09/07/2022   MDD (major depressive disorder), recurrent episode, mild (HCC) 11/17/2021   Neuropathy involving both lower extremities 11/17/2021   Perennial allergic rhinitis with seasonal variation 11/17/2021   Chronic pain of both lower extremities 09/24/2020   Sleeps in sitting position due to orthopnea 07/02/2020   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   Vitamin D deficiency 04/22/2020   COVID-19 long hauler manifesting chronic fatigue 04/22/2020   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   Memory loss 02/28/2020   Physical deconditioning 02/28/2020   Dyspnea on exertion 02/28/2020   Neck pain, bilateral 02/28/2020   Muscle spasms of neck 02/28/2020   Arthralgia of hand 01/24/2019   CRP elevated 01/24/2019   ESR raised 01/24/2019   Class 3 severe obesity due to excess  calories without serious comorbidity in adult [facility]) 01/24/2019   Palpitations 09/16/2018   Coronary artery calcification 09/16/2018   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   Bilateral leg edema 04/20/2017   Incidental lung nodule, > 3mm and < 8mm 03/31/2017   Hives 09/02/2015   Morbid obesity (HCC) 08/15/2015   Anxiety 07/09/2015   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 06/28/2015   Thyroid nodule 02/01/2015   Vitamin B12 deficiency    History of abnormal cervical Pap smear    Allergy-induced asthma, mild intermittent, uncomplicated 10/09/2013    PCP: [name], MD  REFERRING PROVIDER: [name], MD  REFERRING DIAG: macromastia, chronic pain of both shoulders, chronic bilateral thoracic back  pain  Rationale for Evaluation and Treatment: Rehabilitation  THERAPY DIAG:  Pain in thoracic spine  Cervicalgia  Chronic pain of both shoulders  ONSET DATE: prior to 2018  PERTINENT HISTORY:  Patient is a 42 y.o. female who presents to outpatient physical therapy with a referral for medical diagnosis macromastia, chronic pain of both shoulders, chronic bilateral thoracic back pain. This patient's chief complaints consist of chronic pain associated with large breasts including pain at the thoracic spine, base of neck, over bilateral upper traps and where bra straps cut into the tops of her shoulders, difficulty with breathing (related to weight on her chest and/or tightness of bras/garments to support her breasts), and difficulty with social interactions and function leading to the following functional deficits: difficulty with prolonged standing, socializing, talking to people, jumping, running, generalized mobility, fatigue, shortness of breath, bending over, sleeping position, laying prone, any activity that requires increased oxygen demand. Relevant past medical history and comorbidities include has Vitamin B12 deficiency; History of abnormal cervical Pap smear; Thyroid nodule; Allergy-induced asthma, mild intermittent, uncomplicated; 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; Anxiety; Morbid obesity (HCC); Hives; Incidental lung nodule, > 3mm and < 8mm; Bilateral leg edema; Palpitations; Coronary artery calcification; Postablative hypothyroidism; Arthralgia of hand; CRP elevated; ESR raised; Class 3 severe obesity due to excess calories without serious comorbidity in adult [facility]); Olfactory impairment; Memory loss; Physical deconditioning; Dyspnea on exertion; Neck pain, bilateral; Muscle spasms of neck; Vitamin D deficiency; COVID-19 long hauler manifesting chronic fatigue; Sleeps in sitting position due to orthopnea; Postviral fatigue syndrome; Chronic pain of both lower extremities; MDD (major depressive  disorder), recurrent episode, mild (HCC); Neuropathy involving both lower extremities; Perennial allergic rhinitis with seasonal variation; and Strain of insertion of tendon of hamstring muscle on their problem list. She  has a past medical history of Abnormal thyroid blood test, Anemia, Anxiety, Asthma, Complication of anesthesia, COVID-19 (01/2020), Depression, Elevated serum glutamic pyruvic transaminase (SGPT) level, Graves disease, History of abnormal cervical Pap smear, History of cervical polypectomy, Hives (09/02/2015), Hypertension, Hypothyroidism, IFG (impaired fasting glucose), Incidental lung nodule, > 3mm and < 8mm (03/31/2017), Low serum vitamin D, Obesity, PONV (postoperative nausea and vomiting), Pregnancy induced hypertension, Reflux, Thyroid disease, Vitamin B12 deficiency, Vitamin D deficiency disease, and Wears dentures.  has a past surgical history that includes Cervical polypectomy; Cesarean section; Colonoscopy with propofol (N/A, 11/14/2018); Esophagogastroduodenoscopy (egd) with propofol (N/A, 11/14/2018); and Givens capsule study (N/A, 12/27/2018). Patient denies hx of cancer, stroke, seizures, diabetes, unexplained weight loss, unexplained changes in bowel or bladder problems, unexplained stumbling or dropping things, osteoporosis, and spinal surgery. Endorses asthma and heart palpitations.   SUBJECTIVE:  SUBJECTIVE STATEMENT: Patient states she is okay. She reports no changes in her concordant symptoms. She is even more tired today than last visit. She states she was okay after last PT session. She has a lot of driving today after PT today due to events outside of her control related to her children's activities. She has been doing her HEP but it makes it harder for her complete her other responsibilities  due to increased fatigue. She felt the manual therapy performed at last PT session was helpful for some temporary pain relief for about 2 days after last PT session.   PAIN:  Are you having pain?  Yes 6/10 neck region.   PRECAUTIONS: post-exertional malaise   PLOF: Independent  PATIENT GOALS: "to get better"  NEXT MD VISIT: 02/02/2023  OBJECTIVE  SELF-REPORTED FUNCTION FOTO score: 41/100 (thoracic spine questionnaire)  TODAY'S TREATMENT:  Therapeutic exercise: to centralize symptoms and improve ROM, strength, muscular endurance, and activity tolerance required for successful completion of functional activities.  - Upper body ergometer level 1 to encourage joint nutrition, warm tissue, induce analgesic effect of aerobic exercise, improve muscular strength and endurance,  and prepare for remainder of session. 5 minutes, forwards. - education on progress, POC, HEP   Manual therapy: to reduce pain and tissue tension, improve range of motion, neuromodulation, in order to promote improved ability to complete functional activities. HOOKLYING  - STM to posterior cervical spine musculature.   PATIENT EDUCATION:  Education details: form for basic exercises today; tactile feedback used and biofeedback   HOME EXERCISE PROGRAM: Access Code: [email or web address] URL: [email or web address] Date: 01/19/2023 Prepared by: Norton Blizzard  Exercises - Seated Upper Trapezius Stretch  - 1 x daily - 3 reps - 30 seconds hold - Seated Levator Scapulae Stretch  - 1 x daily - 3 reps - 30 seconds hold - Sidelying Thoracic Rotation with Open Book  - 1 x daily - 1-2 sets - 10 reps - 5 seconds hold - Shoulder External Rotation and Scapular Retraction with Resistance  - 3-6 x weekly - 3 sets - 10 reps - Seated Posture with Lumbar Roll   ASSESSMENT:  CLINICAL IMPRESSION: Patient has attended 6 physical therapy sessions since starting current episode of care on 12/30/2022. Patient is participating well in  a HEP at this point, but has not made any progress towards her other goals. Also, participating in HEP and attending PT appears to be decreasing her quality of life because she has post-exertional malaise a part of her chronic fatigue symptoms from long COVID19, for which energy conservation techniques instead of exercise is indicated to maximize function. Due to the nature of these comorbid conditions, exercise, by definition, uses up energy/capacity that she would otherwise be able to use to complete basic daily tasks that she is already unable to complete sufficiently. Physical therapy is an ineffective means to reduce or cope with the mechanical load placed on the patient's body by her abnormally large breasts and has been unable to decrease her neck, upper back, and shoulder pain. Moreover, physical therapy is inappropriate for this patient because of her post-exertional malaise which causes exercise to worsen her functional and participation limitations. Continued PT is not indicated at this point and the patient would benefit from other options for reducing the strain of her large breasts. PT is actually a relatively contraindicated for this patient at this point in her care. The only reason PT should continue is as is required by insurance to satisfy attempt at conservative care requirements  before finding alternative solutions an acceptable course of treatment. Patient is now discharged from PT unless she requires another visit to satisfy insurance requirements.   OBJECTIVE IMPAIRMENTS: decreased activity tolerance, decreased cognition, decreased endurance, decreased ROM, decreased strength, impaired perceived functional ability, increased muscle spasms, impaired UE functional use, improper body mechanics, postural dysfunction, obesity, and pain.   ACTIVITY LIMITATIONS: carrying, lifting, bending, sitting, standing, and sleeping  PARTICIPATION LIMITATIONS: interpersonal relationship, shopping,  community activity, and   difficulty with prolonged standing, socializing, talking to people, jumping, running, generalized mobility, fatigue, shortness of breath, bending over, sleeping position, laying prone, any activity that requires increased oxygen demand  PERSONAL FACTORS: Fitness, Past/current experiences, Time since onset of injury/illness/exacerbation, and 3+ comorbidities:   Vitamin B12 deficiency; History of abnormal cervical Pap smear; Thyroid nodule; Allergy-induced asthma, mild intermittent, uncomplicated; 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; Anxiety; Morbid obesity (HCC); Hives; Incidental lung nodule, > 3mm and < 8mm; Bilateral leg edema; Palpitations; Coronary artery calcification; Postablative hypothyroidism; Arthralgia of hand; CRP elevated; ESR raised; Class 3 severe obesity due to excess calories without serious comorbidity in adult [facility]); Olfactory impairment; Memory loss; Physical deconditioning; Dyspnea on exertion; Neck pain, bilateral; Muscle spasms of neck; Vitamin D deficiency; COVID-19 long hauler manifesting chronic fatigue; Sleeps in sitting position due to orthopnea; Postviral fatigue syndrome; Chronic pain of both lower extremities; MDD (major depressive disorder), recurrent episode, mild (HCC); Neuropathy involving both lower extremities; Perennial allergic rhinitis with seasonal variation; and Strain of insertion of tendon of hamstring muscle on their problem list. She  has a past medical history of Abnormal thyroid blood test, Anemia, Anxiety, Asthma, Complication of anesthesia, COVID-19 (01/2020), Depression, Elevated serum glutamic pyruvic transaminase (SGPT) level, Graves disease, History of abnormal cervical Pap smear, History of cervical polypectomy, Hives (09/02/2015), Hypertension, Hypothyroidism, IFG (impaired fasting glucose), Incidental lung nodule, > 3mm and < 8mm (03/31/2017), Low serum vitamin D, Obesity, PONV (postoperative nausea and vomiting), Pregnancy induced  hypertension, Reflux, Thyroid disease, Vitamin B12 deficiency, Vitamin D deficiency disease, and Wears dentures.  has a past surgical history that includes Cervical polypectomy; Cesarean section; Colonoscopy with propofol (N/A, 11/14/2018); Esophagogastroduodenoscopy (egd) with propofol (N/A, 11/14/2018); and Givens capsule study (N/A, 12/27/2018) are also affecting patient's functional outcome.   REHAB POTENTIAL: Poor due to nature and severity of condition as well as comorbid conditions.   CLINICAL DECISION MAKING: Stable/uncomplicated  EVALUATION COMPLEXITY: Low   GOALS: Goals reviewed with patient? No  SHORT TERM GOALS: Target date: 01/13/2023  Patient will be independent with initial home exercise program for self-management of symptoms. Baseline: Initial HEP to be provided at visit 2 as appropriate (12/30/22); Goal status:  MET   LONG TERM GOALS: Target date: 03/24/2023  Patient will be independent with a long-term home exercise program for self-management of symptoms.  Baseline: Initial HEP to be provided at visit 2 as appropriate (12/30/22); initial HEP provided at visit #3 (01/11/2023); She has been doing her HEP but it makes it harder for her complete her other responsibilities due to increased fatigue (02/01/2023);  Goal status: MET  2.  Patient will demonstrate improved FOTO by equal or greater than 10 points by visit #7 to demonstrate improvement in overall condition and self-reported functional ability.  Baseline: to be measured by visit 2 as appropriate (12/30/22); 41 at visit #2 (01/06/2023); 41 at visit #6 (02/01/2023);  Goal status: NOT MET  3.  Patient will report decrease in pain during functional activity to equal or less than 3/10 to improve her ability to sleep  and participate in usual activities with less difficulty.  Baseline: up to 10/10 (12/30/22); 8/10 (01/19/2023);  up to 10/10 (02/01/2023); Goal status: NOT MET  4.  Patient will complete community, work and/or  recreational activities with 50% less limitation due to current condition.  Baseline: difficulty with prolonged standing, socializing, talking to people, jumping, running, generalized mobility, fatigue, shortness of breath, bending over, sleeping position, laying prone, any activity that requires increased oxygen demand (12/30/22); no change (01/19/2023); no change (02/01/2023);  Goal status: NOT MET    PLAN:  PT FREQUENCY: 1x/week  PT DURATION: 6-12 weeks  PLANNED INTERVENTIONS: Therapeutic exercises, Therapeutic activity, Neuromuscular re-education, Patient/Family education, Self Care, Joint mobilization, Dry Needling, Electrical stimulation, Spinal mobilization, Cryotherapy, Moist heat, Manual therapy, and Re-evaluation.  PLAN FOR NEXT SESSION: Patient is now discharged from PT unless she requires another visit to satisfy insurance requirements.    Cira Rue, PT, DPT 02/01/2023, 9:05 PM   [facility] [address] [place], [place] [address] P: [phone] I F: [phone]

## 2023-02-01 NOTE — Progress Notes (Unsigned)
   Referring Provider [name], MD [address] [place],  [place] [address]   CC: No chief complaint on file.     Catherine Berg is an 42 y.o. female.  HPI: Patient is a 42 y.o. year old female here for follow up after completing physical therapy for pain related to macromastia.  She was seen for initial consult by Dr. Ladona Ridgel.  At that time, complained of significant back and neck discomfort in context of large breasts.  STN 42 cm on the right, 43 cm on the left.  Estimated excess tissue to be removed at time of surgery = [phone] g each breast.  Discussed possibility of free nipple graft.  Plan for follow up after PT.   Review of Systems General: Denies fevers MSK: Endorses ongoing back and neck discomfort Skin: Denies rashes ***  Physical Exam    12/24/2022   10:09 AM 12/14/2022    9:58 AM 11/09/2022   10:15 AM  Vitals with BMI  Height 5\' 4"  5\' 4"  5\' 3"   Weight 234 lbs 236 lbs 237 lbs 10 oz  BMI 40.15 40.49 42.1  Systolic 138 130 604  Diastolic 89 78 91  Pulse 71 93 70    General:  No acute distress,  Alert and oriented, Non-Toxic, Normal speech and affect Psych: Normal behavior and mood Respiratory: No increased WOB MSK: Ambulatory  Assessment/Plan ***  Patient is interested in pursuing surgical intervention for bilateral breast reduction. Patient has completed at least 6 weeks of physical therapy for pain related to macromastia.  Discussed with patient we would submit to insurance for authorization, discussed approval could take up to 6 weeks.   [name] 02/01/2023, 3:19 PM

## 2023-02-02 ENCOUNTER — Ambulatory Visit: Payer: Medicare Other | Admitting: Physician Assistant

## 2023-02-02 VITALS — BP 162/75 | HR 90 | Wt 234.0 lb

## 2023-02-02 DIAGNOSIS — N62 Hypertrophy of breast: Secondary | ICD-10-CM

## 2023-02-02 DIAGNOSIS — M542 Cervicalgia: Secondary | ICD-10-CM

## 2023-02-02 DIAGNOSIS — M546 Pain in thoracic spine: Secondary | ICD-10-CM

## 2023-02-03 ENCOUNTER — Encounter: Payer: Medicare Other | Admitting: Physical Therapy

## 2023-02-08 ENCOUNTER — Encounter: Payer: Medicare Other | Admitting: Physical Therapy

## 2023-02-11 ENCOUNTER — Ambulatory Visit: Payer: Medicare Other | Attending: Plastic Surgery | Admitting: Physical Therapy

## 2023-02-11 ENCOUNTER — Encounter: Payer: Medicare Other | Admitting: Physical Therapy

## 2023-02-15 ENCOUNTER — Encounter: Payer: Medicare Other | Admitting: Physical Therapy

## 2023-02-18 ENCOUNTER — Encounter: Payer: Medicare Other | Admitting: Physical Therapy

## 2023-02-18 NOTE — Progress Notes (Addendum)
Sent a video visit link through Epic, but the patient didn't sign in. Tried calling for today's appointment, but got no answer. Left a voicemail instructing the patient to contact the office at [phone].  

## 2023-02-22 ENCOUNTER — Encounter: Payer: Medicare Other | Admitting: Physical Therapy

## 2023-02-24 ENCOUNTER — Telehealth (INDEPENDENT_AMBULATORY_CARE_PROVIDER_SITE_OTHER): Payer: Self-pay | Admitting: Psychiatry

## 2023-02-24 DIAGNOSIS — Z91199 Patient's noncompliance with other medical treatment and regimen due to unspecified reason: Secondary | ICD-10-CM

## 2023-02-25 ENCOUNTER — Ambulatory Visit (INDEPENDENT_AMBULATORY_CARE_PROVIDER_SITE_OTHER): Payer: Medicare Other | Admitting: Nurse Practitioner

## 2023-02-25 ENCOUNTER — Encounter: Payer: Self-pay | Admitting: Family Medicine

## 2023-02-25 ENCOUNTER — Other Ambulatory Visit: Payer: Self-pay

## 2023-02-25 ENCOUNTER — Ambulatory Visit
Admission: RE | Admit: 2023-02-25 | Discharge: 2023-02-25 | Disposition: A | Discharge status: Home / Self Care | Payer: Medicare Other | Source: Ambulatory Visit | Attending: Nurse Practitioner | Admitting: Nurse Practitioner

## 2023-02-25 ENCOUNTER — Encounter: Payer: Self-pay | Admitting: Nurse Practitioner

## 2023-02-25 ENCOUNTER — Ambulatory Visit
Admission: RE | Admit: 2023-02-25 | Discharge: 2023-02-25 | Disposition: A | Discharge status: Home / Self Care | Payer: Medicare Other | Attending: Nurse Practitioner | Admitting: Nurse Practitioner

## 2023-02-25 ENCOUNTER — Encounter: Payer: Medicare Other | Admitting: Physical Therapy

## 2023-02-25 VITALS — BP 128/76 | HR 97 | Temp 98.0°F | Resp 16 | Ht 64.0 in | Wt 235.0 lb

## 2023-02-25 DIAGNOSIS — R630 Anorexia: Secondary | ICD-10-CM

## 2023-02-25 DIAGNOSIS — R195 Other fecal abnormalities: Secondary | ICD-10-CM | POA: Insufficient documentation

## 2023-02-25 DIAGNOSIS — R140 Abdominal distension (gaseous): Secondary | ICD-10-CM

## 2023-02-25 DIAGNOSIS — K581 Irritable bowel syndrome with constipation: Secondary | ICD-10-CM | POA: Insufficient documentation

## 2023-02-25 DIAGNOSIS — K219 Gastro-esophageal reflux disease without esophagitis: Secondary | ICD-10-CM

## 2023-02-25 MED ORDER — OMEPRAZOLE 20 MG PO CPDR: 20.0000 mg | DELAYED_RELEASE_CAPSULE | Freq: Two times a day (BID) | ORAL | 3 refills | Status: DC

## 2023-02-25 NOTE — Progress Notes (Signed)
BP 128/76   Pulse 97   Temp 98 F (36.7 C) (Oral)   Resp 16   Ht 5\' 4"  (1.626 m)   Wt 235 lb (106.6 kg)   SpO2 97%   BMI 40.34 kg/m    Subjective:    Patient ID: Catherine Berg, female    DOB: 01/11/1981, 42 y.o.   MRN: [id number]  HPI: Catherine Berg is a 42 y.o. female  Chief Complaint  Patient presents with   Gastroesophageal Reflux    Stomach feels tight. Changes in bowel movement,    IBS/constipation/bloating: patient reports that she has been constipated for 2 weeks ago. She says she did take a laxative and it did help. Patient reports she saw a substance that looks like worms.  She says she still feels like she is not as regular as previously.   She also reports bloating, reduced appetite, heart burn and burping.  Denies any urinary complaints.  She denies any diarrhea at this time.  Patient states her stool is still a small balls.  Continue to take laxative as needed.  She does have a history of IBS and was seen by GI on 10/19/2022.  She was put on bentyl and was supposed to follow up in 4 weeks but she did not. Patient reports she has an appointment with gi 03/23/2023.  Will get some labs, H. pylori test, KUB, stool cultures.  Will start omeprazole 20 mg twice daily  Relevant past medical, surgical, family and social history reviewed and updated as indicated. Interim medical history since our last visit reviewed. Allergies and medications reviewed and updated.  Review of Systems  Constitutional: Negative for fever or weight change.  Respiratory: Negative for cough and shortness of breath.   Cardiovascular: Negative for chest pain or palpitations.  Gastrointestinal: positive for abdominal pain, constipation Musculoskeletal: Negative for gait problem or joint swelling.  Skin: Negative for rash.  Neurological: Negative for dizziness or headache.  No other specific complaints in a complete review of systems (except as listed in HPI above).      Objective:    BP 128/76    Pulse 97   Temp 98 F (36.7 C) (Oral)   Resp 16   Ht 5\' 4"  (1.626 m)   Wt 235 lb (106.6 kg)   SpO2 97%   BMI 40.34 kg/m   Wt Readings from Last 3 Encounters:  02/25/23 235 lb (106.6 kg)  02/02/23 234 lb (106.1 kg)  12/24/22 234 lb (106.1 kg)    Physical Exam  Constitutional: Patient appears well-developed and well-nourished. Obese  No distress.  HEENT: head atraumatic, normocephalic, pupils equal and reactive to light, neck supple Cardiovascular: Normal rate, regular rhythm and normal heart sounds.  No murmur heard. No BLE edema. Pulmonary/Chest: Effort normal and breath sounds normal. No respiratory distress. Abdominal: Soft, rounded.  There is no tenderness. Bowel sounds present Psychiatric: Patient has a normal mood and affect. behavior is normal. Judgment and thought content normal.  Results for orders placed or performed in visit on 12/14/22  CULTURE, URINE COMPREHENSIVE   Specimen: Urine  Result Value Ref Range   MICRO NUMBER: [id number]    SPECIMEN QUALITY: Adequate    Source OTHER (SPECIFY)    STATUS: FINAL    RESULT: No Growth   POCT Urinalysis Dipstick  Result Value Ref Range   Color, UA Yellow    Clarity, UA Clear    Glucose, UA Negative Negative   Bilirubin, UA Negative  Ketones, UA Negative    Spec Grav, UA 1.010 1.010 - 1.025   Blood, UA Negative    pH, UA 6.0 5.0 - 8.0   Protein, UA Negative Negative   Urobilinogen, UA 0.2 0.2 or 1.0 E.U./dL   Nitrite, UA Negative    Leukocytes, UA Negative Negative   Appearance Normal    Odor None       Assessment & Plan:   Problem List Items Addressed This Visit   None Visit Diagnoses     Bloating    -  Primary   Getting labs, H. pylori, KUB, stool cultures.  Start omeprazole 20 mg twice daily.  Keep follow-up appointment with GI.  Can still use laxative as needed.   Relevant Orders   H. pylori breath test   CBC with Differential/Platelet   COMPLETE METABOLIC PANEL WITH GFR   DG Abd 1 View   Irritable  bowel syndrome with constipation       Getting labs, H. pylori, KUB, stool cultures. Start omeprazole 20 mg twice daily. Keep follow-up appointment with GI. Can still use laxative as needed.   Relevant Medications   omeprazole (PRILOSEC) 20 MG capsule   Other Relevant Orders   H. pylori breath test   CBC with Differential/Platelet   COMPLETE METABOLIC PANEL WITH GFR   DG Abd 1 View   Gastroesophageal reflux disease without esophagitis       Getting labs, H. pylori, KUB, stool cultures. Start omeprazole 20 mg twice daily. Keep follow-up appointment with GI. Can still use laxative as needed.   Relevant Medications   omeprazole (PRILOSEC) 20 MG capsule   Other Relevant Orders   H. pylori breath test   CBC with Differential/Platelet   COMPLETE METABOLIC PANEL WITH GFR   DG Abd 1 View   Decreased appetite       Getting labs, H. pylori, KUB, stool cultures. Start omeprazole 20 mg twice daily. Keep follow-up appointment with GI. Can still use laxative as needed.   Relevant Orders   H. pylori breath test   CBC with Differential/Platelet   COMPLETE METABOLIC PANEL WITH GFR   DG Abd 1 View   Abnormal stools       Getting labs, H. pylori, KUB, stool cultures. Start omeprazole 20 mg twice daily. Keep follow-up appointment with GI. Can still use laxative as needed.   Relevant Orders   Ova and parasite examination   Salmonella/Shigella Cult, Campy EIA and Shiga Toxin reflex   Stool Giardia/Cryptosporidium   DG Abd 1 View        Follow up plan: Return if symptoms worsen or fail to improve.

## 2023-02-26 ENCOUNTER — Telehealth (INDEPENDENT_AMBULATORY_CARE_PROVIDER_SITE_OTHER): Payer: Medicare Other | Admitting: Psychiatry

## 2023-02-26 ENCOUNTER — Encounter: Payer: Self-pay | Admitting: Psychiatry

## 2023-02-26 DIAGNOSIS — F330 Major depressive disorder, recurrent, mild: Secondary | ICD-10-CM

## 2023-02-26 DIAGNOSIS — F4310 Post-traumatic stress disorder, unspecified: Secondary | ICD-10-CM

## 2023-02-26 DIAGNOSIS — F411 Generalized anxiety disorder: Secondary | ICD-10-CM | POA: Diagnosis not present

## 2023-02-26 LAB — CBC WITH DIFFERENTIAL/PLATELET
Absolute Lymphocytes: 2957 {cells}/uL (ref 850–3900)
Absolute Monocytes: 409 {cells}/uL (ref 200–950)
Basophils Absolute: 33 {cells}/uL (ref 0–200)
Basophils Relative: 0.5 %
Eosinophils Absolute: 92 {cells}/uL (ref 15–500)
Eosinophils Relative: 1.4 %
HCT: 38.3 % (ref 35.0–45.0)
Hemoglobin: 12.4 g/dL (ref 11.7–15.5)
MCH: 28.1 pg (ref 27.0–33.0)
MCHC: 32.4 g/dL (ref 32.0–36.0)
MCV: 86.8 fL (ref 80.0–100.0)
MPV: 10.1 fL (ref 7.5–12.5)
Monocytes Relative: 6.2 %
Neutro Abs: 3109 {cells}/uL (ref 1500–7800)
Neutrophils Relative %: 47.1 %
Platelets: 356 10*3/uL (ref 140–400)
RBC: 4.41 10*6/uL (ref 3.80–5.10)
RDW: 13.1 % (ref 11.0–15.0)
Total Lymphocyte: 44.8 %
WBC: 6.6 10*3/uL (ref 3.8–10.8)

## 2023-02-26 LAB — COMPLETE METABOLIC PANEL WITH GFR
AG Ratio: 1.2 (calc) (ref 1.0–2.5)
ALT: 19 U/L (ref 6–29)
AST: 18 U/L (ref 10–30)
Albumin: 4.1 g/dL (ref 3.6–5.1)
Alkaline phosphatase (APISO): 69 U/L (ref 31–125)
BUN: 9 mg/dL (ref 7–25)
CO2: 26 mmol/L (ref 20–32)
Calcium: 9.7 mg/dL (ref 8.6–10.2)
Chloride: 102 mmol/L (ref 98–110)
Creat: 0.79 mg/dL (ref 0.50–0.99)
Globulin: 3.5 g/dL (ref 1.9–3.7)
Glucose, Bld: 95 mg/dL (ref 65–99)
Potassium: 4.3 mmol/L (ref 3.5–5.3)
Sodium: 136 mmol/L (ref 135–146)
Total Bilirubin: 0.5 mg/dL (ref 0.2–1.2)
Total Protein: 7.6 g/dL (ref 6.1–8.1)
eGFR: 95 mL/min/{1.73_m2} (ref >=60)

## 2023-02-26 LAB — H. PYLORI BREATH TEST: H. pylori Breath Test: DETECTED — AB

## 2023-02-26 MED ORDER — HYDROXYZINE HCL 10 MG PO TABS: 10.0000 mg | ORAL_TABLET | Freq: Every day | ORAL | 3 refills | Status: AC | PRN

## 2023-02-26 MED ORDER — TRAZODONE HCL 50 MG PO TABS: 50.0000 mg | ORAL_TABLET | Freq: Every evening | ORAL | 3 refills | Status: DC | PRN

## 2023-02-26 MED ORDER — BUSPIRONE HCL 5 MG PO TABS: 5.0000 mg | ORAL_TABLET | Freq: Two times a day (BID) | ORAL | 1 refills | Status: AC
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
## 2023-03-01 ENCOUNTER — Other Ambulatory Visit: Payer: Self-pay | Admitting: Nurse Practitioner

## 2023-03-01 ENCOUNTER — Encounter: Payer: Medicare Other | Admitting: Physical Therapy

## 2023-03-01 DIAGNOSIS — A048 Other specified bacterial intestinal infections: Secondary | ICD-10-CM

## 2023-03-01 MED ORDER — AMOXICILLIN 500 MG PO CAPS
1000.0000 mg | ORAL_CAPSULE | Freq: Two times a day (BID) | ORAL | 0 refills | Status: AC
Start: 2023-03-01 — End: 2023-03-15

## 2023-03-01 MED ORDER — CLARITHROMYCIN 500 MG PO TABS
500.0000 mg | ORAL_TABLET | Freq: Two times a day (BID) | ORAL | 0 refills | Status: AC
Start: 2023-03-01 — End: 2023-03-15

## 2023-03-04 ENCOUNTER — Encounter: Payer: Medicare Other | Admitting: Physical Therapy

## 2023-03-17 ENCOUNTER — Ambulatory Visit: Payer: Medicare Other | Admitting: Licensed Clinical Social Worker

## 2023-03-17 DIAGNOSIS — F4310 Post-traumatic stress disorder, unspecified: Secondary | ICD-10-CM | POA: Diagnosis not present

## 2023-03-17 DIAGNOSIS — F331 Major depressive disorder, recurrent, moderate: Secondary | ICD-10-CM | POA: Diagnosis not present

## 2023-03-17 DIAGNOSIS — F411 Generalized anxiety disorder: Secondary | ICD-10-CM | POA: Diagnosis not present
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
## 2023-03-18 ENCOUNTER — Ambulatory Visit: Payer: Medicare Other | Admitting: Physician Assistant

## 2023-03-18 ENCOUNTER — Encounter: Payer: Self-pay | Admitting: Physician Assistant

## 2023-03-18 VITALS — BP 144/86 | HR 72 | Ht 63.0 in | Wt 235.8 lb

## 2023-03-18 DIAGNOSIS — N62 Hypertrophy of breast: Secondary | ICD-10-CM

## 2023-03-18 MED ORDER — OXYCODONE HCL 5 MG PO TABS: 5.0000 mg | ORAL_TABLET | Freq: Three times a day (TID) | ORAL | 0 refills | Status: AC | PRN

## 2023-03-18 MED ORDER — ONDANSETRON 4 MG PO TBDP: 4.0000 mg | ORAL_TABLET | Freq: Three times a day (TID) | ORAL | 0 refills | Status: DC | PRN

## 2023-03-18 NOTE — H&P (View-Only) (Signed)
 Patient ID: Catherine Berg, female    DOB: 12-17-1980, 42 y.o.   MRN: [id number]  Chief Complaint  Patient presents with   Pre-op Exam      ICD-10-CM   1. Macromastia  N62        History of Present Illness: Catherine Berg is a 42 y.o.  female  with a history of macromastia.  She presents for preoperative evaluation for upcoming procedure, bilateral breast reduction possible amputation technique and free nipple graft, scheduled for 04/13/2023 with Dr.  Ladona Ridgel .  The patient has not had problems with anesthesia.  Previous cholecystectomy without issue.  Her allergy-induced asthma is well-controlled on her Dulera inhaler and she rarely ever has to use her rescue inhaler.  She denies any personal history of tobacco use and denies any current alcohol use.  She reports that her grandmother has history of pulmonary embolism, but unclear if provoked.  No other personal or family history of DVT/PE.  She denies any personal history of cardiac disease, use of anticoagulation, cancer, or varicosities.  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  Discussed postoperative expectations with patient and she is understanding and agreeable.  She will hold her Mobic and vitamins/supplements 3 days prior to surgery.  Endorses latex allergy.  Summary of Previous Visit: She was seen for initial consult by Dr. Ladona Ridgel. At that time, complained of significant back and neck discomfort in context of large breasts. STN 42 cm on the right, 43 cm on the left. Estimated excess tissue to be removed at time of surgery = [phone] g each breast. Discussed possibility of free nipple graft.  She then returned after completing PT and still continue to endorse symptoms related to macromastia.  No significant improvement in her chronic upper back and neck discomfort.  Reported that she is a G cup.  Expressed continued interest in proceeding with breast reduction surgery.  Job: Not  currently working, no forms required.  PMH Significant for: Macromastia, obesity on metformin, chronic pain on Mobic and Cymbalta, allergy induced asthma, thyroid disorder on Synthroid, long COVID with chronic fatigue, GERD, IBS, HTN, PTSD.   Past Medical History: Allergies: Allergies  Allergen Reactions   Latex Hives    (Balloons, condoms, underwear elastic, gloves)   Shellfish Allergy Hives    Current Medications:  Current Outpatient Medications:    amLODipine (NORVASC) 5 MG tablet, Take 1 tablet (5 mg total) by mouth daily., Disp: 90 tablet, Rfl: 1   Azelastine HCl 137 MCG/SPRAY SOLN, SPRAY TWO SPRAYS IN EACH NOSTRIL TWICE DAILY., Disp: 30 mL, Rfl: 1   busPIRone (BUSPAR) 5 MG tablet, Take 1 tablet (5 mg total) by mouth 2 (two) times daily., Disp: 60 tablet, Rfl: 1   Cyanocobalamin (B-12) 1000 MCG SUBL, Place 1 tablet under the tongue daily., Disp: 90 tablet, Rfl: 1   DULoxetine (CYMBALTA) 60 MG capsule, Take 1 capsule (60 mg total) by mouth daily. Total of 90 mg daily. Take along with 30 mg cap, Disp: 30 capsule, Rfl: 4   etonogestrel (NEXPLANON) 68 MG IMPL implant, Inject 68 mg into the skin once., Disp: , Rfl:    hydrOXYzine (ATARAX) 10 MG tablet, Take 1 tablet (10 mg total) by mouth daily as needed for anxiety., Disp: 30 tablet, Rfl: 3   levothyroxine (SYNTHROID) 88 MCG tablet, Take 88 mcg by mouth daily before breakfast., Disp: , Rfl:    meloxicam (MOBIC) 15 MG tablet, Take  15 mg by mouth daily., Disp: , Rfl:    metFORMIN (GLUCOPHAGE) 500 MG tablet, Take 1 tablet (500 mg total) by mouth daily with breakfast., Disp: 90 tablet, Rfl: 3   mometasone-formoterol (DULERA) 200-5 MCG/ACT AERO, Inhale 2 puffs into the lungs in the morning and at bedtime., Disp: 1 each, Rfl: 11   montelukast (SINGULAIR) 10 MG tablet, Take 1 tablet (10 mg total) by mouth at bedtime., Disp: 90 tablet, Rfl: 1   omeprazole (PRILOSEC) 20 MG capsule, Take 1 capsule (20 mg total) by mouth 2 (two) times daily before  a meal., Disp: 90 capsule, Rfl: 3   topiramate (TOPAMAX) 100 MG tablet, Take 1 tablet (100 mg total) by mouth at bedtime., Disp: 90 tablet, Rfl: 3   traZODone (DESYREL) 50 MG tablet, Take 1 tablet (50 mg total) by mouth at bedtime as needed for sleep., Disp: 30 tablet, Rfl: 3   VENTOLIN HFA 108 (90 Base) MCG/ACT inhaler, INHALE 2 PUFFS BY MOUTH EVERY 6 HOURS AS NEEDED FOR WHEEZING OR SHORTNESS OF BREATH, Disp: 18 g, Rfl: 1   Vitamin D, Ergocalciferol, (DRISDOL) 1.25 MG (50000 UNIT) CAPS capsule, Take 1 capsule (50,000 Units total) by mouth every 7 (seven) days., Disp: 12 capsule, Rfl: 1   dicyclomine (BENTYL) 10 MG capsule, Take 1 capsule (10 mg total) by mouth 4 (four) times daily -  before meals and at bedtime., Disp: 90 capsule, Rfl: 2  Past Medical Problems: Past Medical History:  Diagnosis Date   Abnormal thyroid blood test    Anemia    Anxiety    Asthma    Complication of anesthesia    itching after c sections   COVID-19 01/2020   Depression    Elevated serum glutamic pyruvic transaminase (SGPT) level    Graves disease    History of abnormal cervical Pap smear    History of cervical polypectomy    Hives 09/02/2015   Hypertension    Hypothyroidism    IFG (impaired fasting glucose)    Incidental lung nodule, > 3mm and < 8mm 03/31/2017   4 mm RLL lung nodule on chest CT Mar 31, 2017   Low serum vitamin D    Obesity    PONV (postoperative nausea and vomiting)    after c section had vomit   Pregnancy induced hypertension    with 1st pregnancy, normal pressure with 2nd   Reflux    Thyroid disease    Vitamin B12 deficiency    Vitamin D deficiency disease    Wears dentures    full upper    Past Surgical History: Past Surgical History:  Procedure Laterality Date   CERVICAL POLYPECTOMY     CESAREAN SECTION     x 2   COLONOSCOPY WITH PROPOFOL N/A 11/14/2018   Procedure: COLONOSCOPY WITH PROPOFOL;  Surgeon: Pasty Spillers, MD;  Location: [facility];  Service:  Endoscopy;  Laterality: N/A;   ESOPHAGOGASTRODUODENOSCOPY (EGD) WITH PROPOFOL N/A 11/14/2018   Procedure: ESOPHAGOGASTRODUODENOSCOPY (EGD) WITH PROPOFOL;  Surgeon: Pasty Spillers, MD;  Location: [facility];  Service: Endoscopy;  Laterality: N/A;  Latex allergy   GIVENS CAPSULE STUDY N/A 12/27/2018   Procedure: GIVENS CAPSULE STUDY;  Surgeon: Pasty Spillers, MD;  Location: [facility];  Service: Endoscopy;  Laterality: N/A;    Social History: Social History   Socioeconomic History   Marital status: Single    Spouse name: Not on file   Number of children: 2   Years of education: 71  Highest education level: High school graduate  Occupational History   Not on file  Tobacco Use   Smoking status: Never   Smokeless tobacco: Never  Vaping Use   Vaping status: Never Used  Substance and Sexual Activity   Alcohol use: No    Alcohol/week: 0.0 standard drinks of alcohol   Drug use: No   Sexual activity: Yes    Partners: Male    Birth control/protection: None  Other Topics Concern   Not on file  Social History Narrative   Not on file   Social Determinants of Health   Financial Resource Strain: Medium Risk (06/17/2022)   Overall Financial Resource Strain (CARDIA)    Difficulty of Paying Living Expenses: Somewhat hard  Food Insecurity: Food Insecurity Present (06/17/2022)   Hunger Vital Sign    Worried About Running Out of Food in the Last Year: Sometimes true    Ran Out of Food in the Last Year: Patient declined  Transportation Needs: No Transportation Needs (06/17/2022)   PRAPARE - Administrator, Civil Service (Medical): No    Lack of Transportation (Non-Medical): No  Physical Activity: Inactive (06/17/2022)   Exercise Vital Sign    Days of Exercise per Week: 0 days    Minutes of Exercise per Session: 0 min  Stress: Stress Concern Present (06/17/2022)   [employer] of Occupational Health - Occupational Stress Questionnaire    Feeling of  Stress : Very much  Social Connections: Moderately Isolated (06/17/2022)   Social Connection and Isolation Panel [NHANES]    Frequency of Communication with Friends and Family: Twice a week    Frequency of Social Gatherings with Friends and Family: Once a week    Attends Religious Services: Never    Database administrator or Organizations: Yes    Attends Banker Meetings: 1 to 4 times per year    Marital Status: Never married  Intimate Partner Violence: Not At Risk (06/17/2022)   Humiliation, Afraid, Rape, and Kick questionnaire    Fear of Current or Ex-Partner: No    Emotionally Abused: No    Physically Abused: No    Sexually Abused: No    Family History: Family History  Problem Relation Age of Onset   Heart attack Mother 27   Hypertension Mother    Asthma Mother    Cancer Mother        laryngeal   Diabetes Mother        pre diabetic   Heart disease Mother    Mental illness Mother    Alcohol abuse Mother    Drug abuse Mother    Depression Mother    Anxiety disorder Mother    Bipolar disorder Mother    Learning disabilities Brother    ADD / ADHD Brother    Asthma Son    Hyperlipidemia Brother    Alcohol abuse Father    Heart disease Maternal Grandmother        heart attack, pacemaker   Heart attack Maternal Grandmother    Hypertension Maternal Grandmother    Hypertension Maternal Grandfather    Heart disease Paternal Grandmother        couple of major open heart surgeries, leaking valves   Hypertension Paternal Grandmother    Hypertension Paternal Grandfather    Breast cancer Neg Hx     Review of Systems: ROS Denies any recent chest pain, difficulty breathing, leg swelling, fevers.  Physical Exam: Vital Signs BP (!) 144/86 (BP Location: Left Arm,  Patient Position: Sitting, Cuff Size: Large)   Pulse 72   Ht 5\' 3"  (1.6 m)   Wt 235 lb 12.8 oz (107 kg)   SpO2 98%   BMI 41.77 kg/m   Physical Exam Constitutional:      General: Not in acute  distress.    Appearance: Normal appearance. Not ill-appearing.  HENT:     Head: Normocephalic and atraumatic.  Eyes:     Pupils: Pupils are equal, round. Cardiovascular:     Rate and Rhythm: Normal rate.    Pulses: Normal pulses.  Pulmonary:     Effort: No respiratory distress or increased work of breathing.  Speaks in full sentences. Abdominal:     General: Abdomen is flat. No distension.   Musculoskeletal: Normal range of motion. No lower extremity swelling or edema. No varicosities.  Skin:    General: Skin is warm and dry.     Findings: No erythema or rash.  Neurological:     Mental Status: Alert and oriented to person, place, and time.  Psychiatric:        Mood and Affect: Mood normal.        Behavior: Behavior normal.    Assessment/Plan: The patient is scheduled for bilateral breast reduction with possible amputation technique and free nipple graft with Dr.  Ladona Ridgel .  Risks, benefits, and alternatives of procedure discussed, questions answered and consent obtained.    Smoking Status: Non-smoker. Last Mammogram: 10/2022; Results: Small area of fat necrosis right breast, but otherwise BI-RADS Category 2: Benign.  Caprini Score: 8; Risk Factors include: Age, BMI greater than 40, family history of thrombosis (grandmother), and length of planned surgery. Recommendation for mechanical and possibly pharmacological prophylaxis. Will discuss with Dr. Ladona Ridgel and prescribe Lovenox if indicated.  Either way, will encourage early ambulation.   Pictures obtained: 11/09/2022  Post-op Rx sent to pharmacy: Oxycodone and Zofran.  Patient was provided with the General Surgical Risk consent document and Pain Medication Agreement prior to their appointment.  They had adequate time to read through the risk consent documents and Pain Medication Agreement. We also discussed them in person together during this preop appointment. All of their questions were answered to their satisfaction.  Recommended  calling if they have any further questions.  Risk consent form and Pain Medication Agreement to be scanned into patient's chart.  The risk that can be encountered with breast reduction were discussed and include the following but not limited to these:  Breast asymmetry, fluid accumulation, firmness of the breast, inability to breast feed, loss of nipple or areola, skin loss, decrease or no nipple sensation, fat necrosis of the breast tissue, bleeding, infection, healing delay.  There are risks of anesthesia, changes to skin sensation and injury to nerves or blood vessels.  The muscle can be temporarily or permanently injured.  You may have an allergic reaction to tape, suture, glue, blood products which can result in skin discoloration, swelling, pain, skin lesions, poor healing.  Any of these can lead to the need for revisonal surgery or stage procedures.  A reduction has potential to interfere with diagnostic procedures.  Nipple or breast piercing can increase risks of infection.  This procedure is best done when the breast is fully developed.  Changes in the breast will continue to occur over time.  Pregnancy can alter the outcomes of previous breast reduction surgery, weight gain and weigh loss can also effect the long term appearance.   We discussed the possibility of amputation/free nipple graft  technique due to the length of her STN.  She is understanding of the possibility that we would need to transition from a pedicle technique to a free nipple graft technique intraoperatively.  We discussed the risks associated with free nipple graft breast reductions, including but not limited to failure of the graft, partial loss of the graft, loss of sensation of bilateral nipple areola, complete loss of the nipple areola graft, inability to breast-feed, postoperative wounds, ongoing wound care.  We also discussed the risks associated with the pedicle technique.  We discussed that with the pedicle technique she  could develop nipple areolar necrosis which would result in loss of the nipple, this would also result in ongoing wound care and possible changes in the shape of her breast.     Electronically signed by: [name], PA-C 03/18/2023 9:47 AM

## 2023-03-18 NOTE — Progress Notes (Signed)
Patient ID: Catherine Berg, female    DOB: 12-17-1980, 42 y.o.   MRN: [id number]  Chief Complaint  Patient presents with   Pre-op Exam      ICD-10-CM   1. Macromastia  N62        History of Present Illness: Catherine Berg is a 42 y.o.  female  with a history of macromastia.  She presents for preoperative evaluation for upcoming procedure, bilateral breast reduction possible amputation technique and free nipple graft, scheduled for 04/13/2023 with Dr.  Ladona Ridgel .  The patient has not had problems with anesthesia.  Previous cholecystectomy without issue.  Her allergy-induced asthma is well-controlled on her Dulera inhaler and she rarely ever has to use her rescue inhaler.  She denies any personal history of tobacco use and denies any current alcohol use.  She reports that her grandmother has history of pulmonary embolism, but unclear if provoked.  No other personal or family history of DVT/PE.  She denies any personal history of cardiac disease, use of anticoagulation, cancer, or varicosities.  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  Discussed postoperative expectations with patient and she is understanding and agreeable.  She will hold her Mobic and vitamins/supplements 3 days prior to surgery.  Endorses latex allergy.  Summary of Previous Visit: She was seen for initial consult by Dr. Ladona Ridgel. At that time, complained of significant back and neck discomfort in context of large breasts. STN 42 cm on the right, 43 cm on the left. Estimated excess tissue to be removed at time of surgery = [phone] g each breast. Discussed possibility of free nipple graft.  She then returned after completing PT and still continue to endorse symptoms related to macromastia.  No significant improvement in her chronic upper back and neck discomfort.  Reported that she is a G cup.  Expressed continued interest in proceeding with breast reduction surgery.  Job: Not  currently working, no forms required.  PMH Significant for: Macromastia, obesity on metformin, chronic pain on Mobic and Cymbalta, allergy induced asthma, thyroid disorder on Synthroid, long COVID with chronic fatigue, GERD, IBS, HTN, PTSD.   Past Medical History: Allergies: Allergies  Allergen Reactions   Latex Hives    (Balloons, condoms, underwear elastic, gloves)   Shellfish Allergy Hives    Current Medications:  Current Outpatient Medications:    amLODipine (NORVASC) 5 MG tablet, Take 1 tablet (5 mg total) by mouth daily., Disp: 90 tablet, Rfl: 1   Azelastine HCl 137 MCG/SPRAY SOLN, SPRAY TWO SPRAYS IN EACH NOSTRIL TWICE DAILY., Disp: 30 mL, Rfl: 1   busPIRone (BUSPAR) 5 MG tablet, Take 1 tablet (5 mg total) by mouth 2 (two) times daily., Disp: 60 tablet, Rfl: 1   Cyanocobalamin (B-12) 1000 MCG SUBL, Place 1 tablet under the tongue daily., Disp: 90 tablet, Rfl: 1   DULoxetine (CYMBALTA) 60 MG capsule, Take 1 capsule (60 mg total) by mouth daily. Total of 90 mg daily. Take along with 30 mg cap, Disp: 30 capsule, Rfl: 4   etonogestrel (NEXPLANON) 68 MG IMPL implant, Inject 68 mg into the skin once., Disp: , Rfl:    hydrOXYzine (ATARAX) 10 MG tablet, Take 1 tablet (10 mg total) by mouth daily as needed for anxiety., Disp: 30 tablet, Rfl: 3   levothyroxine (SYNTHROID) 88 MCG tablet, Take 88 mcg by mouth daily before breakfast., Disp: , Rfl:    meloxicam (MOBIC) 15 MG tablet, Take  15 mg by mouth daily., Disp: , Rfl:    metFORMIN (GLUCOPHAGE) 500 MG tablet, Take 1 tablet (500 mg total) by mouth daily with breakfast., Disp: 90 tablet, Rfl: 3   mometasone-formoterol (DULERA) 200-5 MCG/ACT AERO, Inhale 2 puffs into the lungs in the morning and at bedtime., Disp: 1 each, Rfl: 11   montelukast (SINGULAIR) 10 MG tablet, Take 1 tablet (10 mg total) by mouth at bedtime., Disp: 90 tablet, Rfl: 1   omeprazole (PRILOSEC) 20 MG capsule, Take 1 capsule (20 mg total) by mouth 2 (two) times daily before  a meal., Disp: 90 capsule, Rfl: 3   topiramate (TOPAMAX) 100 MG tablet, Take 1 tablet (100 mg total) by mouth at bedtime., Disp: 90 tablet, Rfl: 3   traZODone (DESYREL) 50 MG tablet, Take 1 tablet (50 mg total) by mouth at bedtime as needed for sleep., Disp: 30 tablet, Rfl: 3   VENTOLIN HFA 108 (90 Base) MCG/ACT inhaler, INHALE 2 PUFFS BY MOUTH EVERY 6 HOURS AS NEEDED FOR WHEEZING OR SHORTNESS OF BREATH, Disp: 18 g, Rfl: 1   Vitamin D, Ergocalciferol, (DRISDOL) 1.25 MG (50000 UNIT) CAPS capsule, Take 1 capsule (50,000 Units total) by mouth every 7 (seven) days., Disp: 12 capsule, Rfl: 1   dicyclomine (BENTYL) 10 MG capsule, Take 1 capsule (10 mg total) by mouth 4 (four) times daily -  before meals and at bedtime., Disp: 90 capsule, Rfl: 2  Past Medical Problems: Past Medical History:  Diagnosis Date   Abnormal thyroid blood test    Anemia    Anxiety    Asthma    Complication of anesthesia    itching after c sections   COVID-19 01/2020   Depression    Elevated serum glutamic pyruvic transaminase (SGPT) level    Graves disease    History of abnormal cervical Pap smear    History of cervical polypectomy    Hives 09/02/2015   Hypertension    Hypothyroidism    IFG (impaired fasting glucose)    Incidental lung nodule, > 3mm and < 8mm 03/31/2017   4 mm RLL lung nodule on chest CT Mar 31, 2017   Low serum vitamin D    Obesity    PONV (postoperative nausea and vomiting)    after c section had vomit   Pregnancy induced hypertension    with 1st pregnancy, normal pressure with 2nd   Reflux    Thyroid disease    Vitamin B12 deficiency    Vitamin D deficiency disease    Wears dentures    full upper    Past Surgical History: Past Surgical History:  Procedure Laterality Date   CERVICAL POLYPECTOMY     CESAREAN SECTION     x 2   COLONOSCOPY WITH PROPOFOL N/A 11/14/2018   Procedure: COLONOSCOPY WITH PROPOFOL;  Surgeon: Pasty Spillers, MD;  Location: [facility];  Service:  Endoscopy;  Laterality: N/A;   ESOPHAGOGASTRODUODENOSCOPY (EGD) WITH PROPOFOL N/A 11/14/2018   Procedure: ESOPHAGOGASTRODUODENOSCOPY (EGD) WITH PROPOFOL;  Surgeon: Pasty Spillers, MD;  Location: [facility];  Service: Endoscopy;  Laterality: N/A;  Latex allergy   GIVENS CAPSULE STUDY N/A 12/27/2018   Procedure: GIVENS CAPSULE STUDY;  Surgeon: Pasty Spillers, MD;  Location: [facility];  Service: Endoscopy;  Laterality: N/A;    Social History: Social History   Socioeconomic History   Marital status: Single    Spouse name: Not on file   Number of children: 2   Years of education: 71  Highest education level: High school graduate  Occupational History   Not on file  Tobacco Use   Smoking status: Never   Smokeless tobacco: Never  Vaping Use   Vaping status: Never Used  Substance and Sexual Activity   Alcohol use: No    Alcohol/week: 0.0 standard drinks of alcohol   Drug use: No   Sexual activity: Yes    Partners: Male    Birth control/protection: None  Other Topics Concern   Not on file  Social History Narrative   Not on file   Social Determinants of Health   Financial Resource Strain: Medium Risk (06/17/2022)   Overall Financial Resource Strain (CARDIA)    Difficulty of Paying Living Expenses: Somewhat hard  Food Insecurity: Food Insecurity Present (06/17/2022)   Hunger Vital Sign    Worried About Running Out of Food in the Last Year: Sometimes true    Ran Out of Food in the Last Year: Patient declined  Transportation Needs: No Transportation Needs (06/17/2022)   PRAPARE - Administrator, Civil Service (Medical): No    Lack of Transportation (Non-Medical): No  Physical Activity: Inactive (06/17/2022)   Exercise Vital Sign    Days of Exercise per Week: 0 days    Minutes of Exercise per Session: 0 min  Stress: Stress Concern Present (06/17/2022)   [employer] of Occupational Health - Occupational Stress Questionnaire    Feeling of  Stress : Very much  Social Connections: Moderately Isolated (06/17/2022)   Social Connection and Isolation Panel [NHANES]    Frequency of Communication with Friends and Family: Twice a week    Frequency of Social Gatherings with Friends and Family: Once a week    Attends Religious Services: Never    Database administrator or Organizations: Yes    Attends Banker Meetings: 1 to 4 times per year    Marital Status: Never married  Intimate Partner Violence: Not At Risk (06/17/2022)   Humiliation, Afraid, Rape, and Kick questionnaire    Fear of Current or Ex-Partner: No    Emotionally Abused: No    Physically Abused: No    Sexually Abused: No    Family History: Family History  Problem Relation Age of Onset   Heart attack Mother 27   Hypertension Mother    Asthma Mother    Cancer Mother        laryngeal   Diabetes Mother        pre diabetic   Heart disease Mother    Mental illness Mother    Alcohol abuse Mother    Drug abuse Mother    Depression Mother    Anxiety disorder Mother    Bipolar disorder Mother    Learning disabilities Brother    ADD / ADHD Brother    Asthma Son    Hyperlipidemia Brother    Alcohol abuse Father    Heart disease Maternal Grandmother        heart attack, pacemaker   Heart attack Maternal Grandmother    Hypertension Maternal Grandmother    Hypertension Maternal Grandfather    Heart disease Paternal Grandmother        couple of major open heart surgeries, leaking valves   Hypertension Paternal Grandmother    Hypertension Paternal Grandfather    Breast cancer Neg Hx     Review of Systems: ROS Denies any recent chest pain, difficulty breathing, leg swelling, fevers.  Physical Exam: Vital Signs BP (!) 144/86 (BP Location: Left Arm,  Patient Position: Sitting, Cuff Size: Large)   Pulse 72   Ht 5\' 3"  (1.6 m)   Wt 235 lb 12.8 oz (107 kg)   SpO2 98%   BMI 41.77 kg/m   Physical Exam Constitutional:      General: Not in acute  distress.    Appearance: Normal appearance. Not ill-appearing.  HENT:     Head: Normocephalic and atraumatic.  Eyes:     Pupils: Pupils are equal, round. Cardiovascular:     Rate and Rhythm: Normal rate.    Pulses: Normal pulses.  Pulmonary:     Effort: No respiratory distress or increased work of breathing.  Speaks in full sentences. Abdominal:     General: Abdomen is flat. No distension.   Musculoskeletal: Normal range of motion. No lower extremity swelling or edema. No varicosities.  Skin:    General: Skin is warm and dry.     Findings: No erythema or rash.  Neurological:     Mental Status: Alert and oriented to person, place, and time.  Psychiatric:        Mood and Affect: Mood normal.        Behavior: Behavior normal.    Assessment/Plan: The patient is scheduled for bilateral breast reduction with possible amputation technique and free nipple graft with Dr.  Ladona Ridgel .  Risks, benefits, and alternatives of procedure discussed, questions answered and consent obtained.    Smoking Status: Non-smoker. Last Mammogram: 10/2022; Results: Small area of fat necrosis right breast, but otherwise BI-RADS Category 2: Benign.  Caprini Score: 8; Risk Factors include: Age, BMI greater than 40, family history of thrombosis (grandmother), and length of planned surgery. Recommendation for mechanical and possibly pharmacological prophylaxis. Will discuss with Dr. Ladona Ridgel and prescribe Lovenox if indicated.  Either way, will encourage early ambulation.   Pictures obtained: 11/09/2022  Post-op Rx sent to pharmacy: Oxycodone and Zofran.  Patient was provided with the General Surgical Risk consent document and Pain Medication Agreement prior to their appointment.  They had adequate time to read through the risk consent documents and Pain Medication Agreement. We also discussed them in person together during this preop appointment. All of their questions were answered to their satisfaction.  Recommended  calling if they have any further questions.  Risk consent form and Pain Medication Agreement to be scanned into patient's chart.  The risk that can be encountered with breast reduction were discussed and include the following but not limited to these:  Breast asymmetry, fluid accumulation, firmness of the breast, inability to breast feed, loss of nipple or areola, skin loss, decrease or no nipple sensation, fat necrosis of the breast tissue, bleeding, infection, healing delay.  There are risks of anesthesia, changes to skin sensation and injury to nerves or blood vessels.  The muscle can be temporarily or permanently injured.  You may have an allergic reaction to tape, suture, glue, blood products which can result in skin discoloration, swelling, pain, skin lesions, poor healing.  Any of these can lead to the need for revisonal surgery or stage procedures.  A reduction has potential to interfere with diagnostic procedures.  Nipple or breast piercing can increase risks of infection.  This procedure is best done when the breast is fully developed.  Changes in the breast will continue to occur over time.  Pregnancy can alter the outcomes of previous breast reduction surgery, weight gain and weigh loss can also effect the long term appearance.   We discussed the possibility of amputation/free nipple graft  technique due to the length of her STN.  She is understanding of the possibility that we would need to transition from a pedicle technique to a free nipple graft technique intraoperatively.  We discussed the risks associated with free nipple graft breast reductions, including but not limited to failure of the graft, partial loss of the graft, loss of sensation of bilateral nipple areola, complete loss of the nipple areola graft, inability to breast-feed, postoperative wounds, ongoing wound care.  We also discussed the risks associated with the pedicle technique.  We discussed that with the pedicle technique she  could develop nipple areolar necrosis which would result in loss of the nipple, this would also result in ongoing wound care and possible changes in the shape of her breast.     Electronically signed by: [name], PA-C 03/18/2023 9:47 AM

## 2023-03-22 ENCOUNTER — Encounter: Payer: Self-pay | Admitting: Nurse Practitioner

## 2023-03-22 ENCOUNTER — Other Ambulatory Visit: Payer: Self-pay | Admitting: Nurse Practitioner

## 2023-03-22 DIAGNOSIS — K581 Irritable bowel syndrome with constipation: Secondary | ICD-10-CM

## 2023-03-22 MED ORDER — LINACLOTIDE 72 MCG PO CAPS
72.0000 ug | ORAL_CAPSULE | Freq: Every day | ORAL | 0 refills | Status: DC
Start: 2023-03-22 — End: 2023-08-23

## 2023-03-22 NOTE — Progress Notes (Signed)
Catherine Berg, Catherine Berg [address]  [address]  [place], [place] [address]  Main: [phone]  Fax: [phone]   Primary Care Physician: Catherine Cory, MD  Primary Gastroenterologist:  Catherine Berg, Catherine Berg / Dr. Wyline Mood    CC:  F/U H. pylori, IBS and GERD  HPI: Catherine Berg is a 42 y.o. female returns for 81-month follow-up of irritable bowel syndrome and acid reflux.  She was previously having diarrhea.  Now she is having constipation.  She has tried dicyclomine 10 Mg PRN and low FODMAP diet.  She recently saw her PCP for IBS, constipation, bloating.  Concerned about worms in her stool.  Was not having diarrhea.  Started on omeprazole 20 Mg twice daily.  She was given Rx for Linzess daily which she plans to start today.  She continues to have abdominal bloating, constipation, and intermittent lower abdominal cramping.  Denies rectal bleeding or weight loss.  Currently having bowel movement every 2 or 3 days.  She denies heartburn, dysphagia, or upper abdominal pain.  02/25/2023: Abdominal x-ray showed large stool burden consistent with constipation. 02/25/2023: H. pylori breath test positive; treated with amoxicillin, clarithromycin, omeprazole.  02/25/2023 stool studies ordered but not yet completed (Giardia, crypto, Salmonella, Shigella, ova, parasite). 01/2023: TSH normal.  Evaluation for iron deficiency anemia in 2020 by Dr. Maximino Greenland which included EGD, colonoscopy, and capsule endoscopy.  All unrevealing for sources of anemia.   -EGD 11/2018 - Normal -Colonoscopy 11/2018 -good prep, no polyps, mild thickened fold in the cecum, normal biopsies. -Capsule endoscopy 12/2018 -nonspecific red spots seen with no active bleeding or lesions.   Cholecystectomy 10/2020 for gallstones.   Current Outpatient Medications  Medication Sig Dispense Refill   amLODipine (NORVASC) 5 MG tablet Take 1 tablet (5 mg total) by mouth daily. 90 tablet 1   Azelastine HCl 137 MCG/SPRAY SOLN  SPRAY TWO SPRAYS IN EACH NOSTRIL TWICE DAILY. 30 mL 1   busPIRone (BUSPAR) 5 MG tablet Take 1 tablet (5 mg total) by mouth 2 (two) times daily. 60 tablet 1   Cyanocobalamin (B-12) 1000 MCG SUBL Place 1 tablet under the tongue daily. 90 tablet 1   DULoxetine (CYMBALTA) 60 MG capsule Take 1 capsule (60 mg total) by mouth daily. Total of 90 mg daily. Take along with 30 mg cap 30 capsule 4   etonogestrel (NEXPLANON) 68 MG IMPL implant Inject 68 mg into the skin once.     hydrOXYzine (ATARAX) 10 MG tablet Take 1 tablet (10 mg total) by mouth daily as needed for anxiety. 30 tablet 3   levothyroxine (SYNTHROID) 88 MCG tablet Take 88 mcg by mouth daily before breakfast.     linaclotide (LINZESS) 72 MCG capsule Take 1 capsule (72 mcg total) by mouth daily before breakfast. 30 capsule 0   meloxicam (MOBIC) 15 MG tablet Take 15 mg by mouth daily.     metFORMIN (GLUCOPHAGE) 500 MG tablet Take 1 tablet (500 mg total) by mouth daily with breakfast. 90 tablet 3   mometasone-formoterol (DULERA) 200-5 MCG/ACT AERO Inhale 2 puffs into the lungs in the morning and at bedtime. 1 each 11   montelukast (SINGULAIR) 10 MG tablet Take 1 tablet (10 mg total) by mouth at bedtime. 90 tablet 1   omeprazole (PRILOSEC) 20 MG capsule Take 1 capsule (20 mg total) by mouth 2 (two) times daily before a meal. 90 capsule 3   ondansetron (ZOFRAN-ODT) 4 MG disintegrating tablet Take 1 tablet (4 mg total) by mouth every  8 (eight) hours as needed for nausea or vomiting. 20 tablet 0   oxyCODONE (ROXICODONE) 5 MG immediate release tablet Take 1 tablet (5 mg total) by mouth every 8 (eight) hours as needed for up to 7 days for severe pain (pain score 7-10). 20 tablet 0   topiramate (TOPAMAX) 100 MG tablet Take 1 tablet (100 mg total) by mouth at bedtime. 90 tablet 3   traZODone (DESYREL) 50 MG tablet Take 1 tablet (50 mg total) by mouth at bedtime as needed for sleep. 30 tablet 3   VENTOLIN HFA 108 (90 Base) MCG/ACT inhaler INHALE 2 PUFFS BY  MOUTH EVERY 6 HOURS AS NEEDED FOR WHEEZING OR SHORTNESS OF BREATH 18 g 1   Vitamin D, Ergocalciferol, (DRISDOL) 1.25 MG (50000 UNIT) CAPS capsule Take 1 capsule (50,000 Units total) by mouth every 7 (seven) days. 12 capsule 1   dicyclomine (BENTYL) 10 MG capsule Take 1 capsule (10 mg total) by mouth 4 (four) times daily -  before meals and at bedtime. 90 capsule 2   No current facility-administered medications for this visit.    Allergies as of 03/23/2023 - Review Complete 03/23/2023  Allergen Reaction Noted   Latex Hives 07/30/2014   Shellfish allergy Hives 07/30/2014    Past Medical History:  Diagnosis Date   Abnormal thyroid blood test    Anemia    Anxiety    Asthma    Complication of anesthesia    itching after c sections   COVID-19 01/2020   Depression    Elevated serum glutamic pyruvic transaminase (SGPT) level    Graves disease    History of abnormal cervical Pap smear    History of cervical polypectomy    Hives 09/02/2015   Hypertension    Hypothyroidism    IFG (impaired fasting glucose)    Incidental lung nodule, > 3mm and < 8mm 03/31/2017   4 mm RLL lung nodule on chest CT Mar 31, 2017   Low serum vitamin D    Obesity    PONV (postoperative nausea and vomiting)    after c section had vomit   Pregnancy induced hypertension    with 1st pregnancy, normal pressure with 2nd   Reflux    Sprain of right great toe 01/21/2023   Thyroid disease    Vitamin B12 deficiency    Vitamin D deficiency disease    Wears dentures    full upper    Past Surgical History:  Procedure Laterality Date   CERVICAL POLYPECTOMY     CESAREAN SECTION     x 2   COLONOSCOPY WITH PROPOFOL N/A 11/14/2018   Procedure: COLONOSCOPY WITH PROPOFOL;  Surgeon: Pasty Spillers, MD;  Location: [facility];  Service: Endoscopy;  Laterality: N/A;   ESOPHAGOGASTRODUODENOSCOPY (EGD) WITH PROPOFOL N/A 11/14/2018   Procedure: ESOPHAGOGASTRODUODENOSCOPY (EGD) WITH PROPOFOL;  Surgeon:  Pasty Spillers, MD;  Location: [facility];  Service: Endoscopy;  Laterality: N/A;  Latex allergy   GIVENS CAPSULE STUDY N/A 12/27/2018   Procedure: GIVENS CAPSULE STUDY;  Surgeon: Pasty Spillers, MD;  Location: [facility];  Service: Endoscopy;  Laterality: N/A;    Review of Systems:    All systems reviewed and negative except where noted in HPI.   Physical Examination:   BP 133/85   Pulse 69   Temp 97.8 F (36.6 C)   Ht 5\' 3"  (1.6 m)   Wt 238 lb 3.2 oz (108 kg)   BMI 42.20 kg/m   General: Well-nourished, well-developed  in no acute distress.  Lungs: Clear to auscultation bilaterally. Non-labored. Heart: Regular rate and rhythm, no murmurs rubs or gallops.  Abdomen: Bowel sounds are normal; Abdomen is Soft; No hepatosplenomegaly, masses or hernias;  No Abdominal Tenderness; No guarding or rebound tenderness. Neuro: Alert and oriented x 3.  Grossly intact.  Psych: Alert and cooperative, normal mood and affect.   Imaging Studies: DG Abd 1 View  Result Date: 03/21/2023 CLINICAL DATA:  Constipation. EXAM: ABDOMEN - 1 VIEW COMPARISON:  01/13/2019. FINDINGS: The bowel gas pattern is non-obstructive. There is moderate-to-large stool burden, including the ascending colon, compatible with colonic hypomotility. No evidence of pneumoperitoneum, within the limitations of a supine film. No acute osseous abnormalities. The soft tissues are within normal limits. Surgical changes, devices, tubes and lines: None. IMPRESSION: *Nonobstructive bowel gas pattern.  Moderate-to-large stool burden. Electronically Signed   By: Jules Schick M.D.   On: 03/21/2023 10:41    Assessment and Plan:   ARRYN DEWAR is a 42 y.o. y/o female presents for:  1.  H. pylori infection (positive H. pylori breath test 02/25/2023)  She finished amoxicillin, clarithromycin, omeprazole last week.  Plan to repeat H. pylori breath test in 4 weeks (off PPI 2 weeks prior to test).  Patient education  given.  2.  Irritable bowel syndrome with constipation  Start Linzess 1 tablet daily.  If constipation continues, then we can try Linzess 145 or 290 mcg dose. Recommend High Fiber diet with fruits, vegetables, and whole grains. Drink 64 ounces of Fluids Daily.  Catherine Berg, Catherine Berg  Follow up 2 months with TG

## 2023-03-23 ENCOUNTER — Encounter: Payer: Self-pay | Admitting: Physician Assistant

## 2023-03-23 ENCOUNTER — Ambulatory Visit: Payer: Medicare Other | Admitting: Physician Assistant

## 2023-03-23 VITALS — BP 133/85 | HR 69 | Temp 97.8°F | Ht 63.0 in | Wt 238.2 lb

## 2023-03-23 DIAGNOSIS — A048 Other specified bacterial intestinal infections: Secondary | ICD-10-CM | POA: Diagnosis not present

## 2023-03-23 DIAGNOSIS — K581 Irritable bowel syndrome with constipation: Secondary | ICD-10-CM | POA: Diagnosis not present

## 2023-03-23 NOTE — Patient Instructions (Signed)
Return for H. Pylori Breath Test in 4 weeks.  Stop / Hold Omeprazole in 2 weeks.  You need to be off this medicine 2 weeks before completing the H. Pylori Test.  Go ahead and start Linzess 1 tab daily. If you are still constipated in 5-7 days, then increase Linzess to 2 tablets daily. Eat High Fiber diet with fruits, vegetables, and whole grains. Drink 64 ounces of water daily.

## 2023-03-26 ENCOUNTER — Encounter: Payer: Self-pay | Admitting: Physician Assistant

## 2023-03-26 ENCOUNTER — Encounter
Payer: Medicare Other | Attending: Physical Medicine and Rehabilitation | Admitting: Physical Medicine and Rehabilitation

## 2023-03-26 VITALS — BP 122/82 | HR 74 | Ht 63.0 in | Wt 238.0 lb

## 2023-03-26 DIAGNOSIS — G609 Hereditary and idiopathic neuropathy, unspecified: Secondary | ICD-10-CM | POA: Diagnosis not present

## 2023-03-26 MED ORDER — CAPSAICIN-CLEANSING GEL 8 % EX KIT
4.0000 | PACK | Freq: Once | CUTANEOUS | Status: AC
Start: 2023-03-26 — End: 2023-03-26
  Administered 2023-03-26: 4 via TOPICAL

## 2023-03-26 NOTE — Progress Notes (Signed)
-  Discussed Qutenza as an option for neuropathic pain control. Discussed that this is a capsaicin patch, stronger than capsaicin cream. Discussed that it is currently approved for diabetic peripheral neuropathy and post-herpetic neuralgia, but that it has also shown benefit in treating other forms of neuropathy. Provided patient with link to site to learn more about the patch: https://www.clark.biz/. Discussed that the patch would be placed in office and benefits usually last 3 months. Discussed that unintended exposure to capsaicin can cause severe irritation of eyes, mucous membranes, respiratory tract, and skin, but that Qutenza is a local treatment and does not have the systemic side effects of other nerve medications. Discussed that there may be pain, itching, erythema, and decreased sensory function associated with the application of Qutenza. Side effects usually subside within 1 week. A cold pack of analgesic medications can help with these side effects. Blood pressure can also be increased due to pain associated with administration of the patch.   4 patches of Qutenza [phone]) was applied to the area of pain. Ice packs were applied during the procedure to ensure patient comfort. Blood pressure was monitored every 15 minutes. The patient tolerated the procedure well. Post-procedure instructions were given and follow-up has been scheduled.  Topical system measures 14cm x20cm (280cm for a total 1120units) were applied which will cause deeper penetration for destruction of the peripheral nerve using a chemical (Qutenza) which infuses into the skin like an injection and heat technique (occlusive, compressive dressing cauing endothermic heat technique)

## 2023-03-26 NOTE — Patient Instructions (Signed)
[place]west wild foods- tart cherries  Insomnia: -Try to go outside near sunrise -Get exercise during the day.  -Turn off all devices an hour before bedtime.  -Teas that can benefit: chamomile, valerian root, Brahmi (Bacopa) -Can consider over the counter melatonin, magnesium, and/or L-theanine. Melatonin is an anti-oxidant with multiple health benefits. Magnesium is involved in greater than 300 enzymatic reactions in the body and most of [place] are deficient as our soil is often depleted. There are 7 different types of magnesium- Bioptemizer's is a supplement with all 7 types, and each has unique benefits. Magnesium can also help with constipation and anxiety.  -Pistachios naturally increase the production of melatonin -Cozy Earth bamboo bed sheets are free from toxic chemicals.  -Tart cherry juice or a tart cherry supplement can improve sleep and soreness post-workout    Chronic Pain Syndrome secondary to___ -Discussed current symptoms of pain and history of pain.  -Discussed benefits of exercise in reducing pain. -Discussed following foods that may reduce pain: 1) Ginger (especially studied for arthritis)- reduce leukotriene production to decrease inflammation 2) Blueberries- high in phytonutrients that decrease inflammation 3) Salmon- marine omega-3s reduce joint swelling and pain 4) Pumpkin seeds- reduce inflammation 5) dark chocolate- reduces inflammation 6) turmeric- reduces inflammation 7) tart cherries - reduce pain and stiffness 8) extra virgin olive oil - its compound olecanthal helps to block prostaglandins  9) chili peppers- can be eaten or applied topically via capsaicin 10) mint- helpful for headache, muscle aches, joint pain, and itching 11) garlic- reduces inflammation  Link to further information on diet for chronic pain: http://www.bray.com/   Turmeric to reduce inflammation--can be used in cooking or  taken as a supplement.  Benefits of turmeric:  -Highly anti-inflammatory  -Increases antioxidants  -Improves memory, attention, brain disease  -Lowers risk of heart disease  -May help prevent cancer  -Decreases pain  -Alleviates depression  -Delays aging and decreases risk of chronic disease  -Consume with black pepper to increase absorption    Turmeric Milk Recipe:  1 cup milk  1 tsp turmeric  1 tsp cinnamon  1 tsp grated ginger (optional)  Black pepper (boosts the anti-inflammatory properties of turmeric).  1 tsp honey
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
## 2023-04-04 HISTORY — PX: REDUCTION MAMMAPLASTY: SUR839

## 2023-04-06 ENCOUNTER — Encounter (HOSPITAL_BASED_OUTPATIENT_CLINIC_OR_DEPARTMENT_OTHER): Payer: Self-pay | Admitting: Plastic Surgery

## 2023-04-07 ENCOUNTER — Telehealth (INDEPENDENT_AMBULATORY_CARE_PROVIDER_SITE_OTHER): Payer: Medicare Other | Admitting: Psychiatry

## 2023-04-07 ENCOUNTER — Encounter: Payer: Self-pay | Admitting: Psychiatry

## 2023-04-07 DIAGNOSIS — F4310 Post-traumatic stress disorder, unspecified: Secondary | ICD-10-CM | POA: Diagnosis not present

## 2023-04-07 DIAGNOSIS — G4700 Insomnia, unspecified: Secondary | ICD-10-CM | POA: Diagnosis not present

## 2023-04-07 DIAGNOSIS — F330 Major depressive disorder, recurrent, mild: Secondary | ICD-10-CM

## 2023-04-07 DIAGNOSIS — F411 Generalized anxiety disorder: Secondary | ICD-10-CM

## 2023-04-07 NOTE — Patient Instructions (Signed)
Continue duloxetine 60 mg daily - Continue buspar 5 mg twice a day  Continue trazodone 50 mg at night as needed for insomnia Continue hydroxyzine 10 mg daily as needed for anxiety  Next appointment: 1/23 at 4 pm

## 2023-04-12 ENCOUNTER — Ambulatory Visit (INDEPENDENT_AMBULATORY_CARE_PROVIDER_SITE_OTHER): Payer: Medicare Other | Admitting: Licensed Clinical Social Worker

## 2023-04-12 DIAGNOSIS — F4310 Post-traumatic stress disorder, unspecified: Secondary | ICD-10-CM

## 2023-04-12 DIAGNOSIS — F330 Major depressive disorder, recurrent, mild: Secondary | ICD-10-CM

## 2023-04-12 DIAGNOSIS — F411 Generalized anxiety disorder: Secondary | ICD-10-CM

## 2023-04-12 NOTE — Progress Notes (Signed)
   THERAPIST PROGRESS NOTE  Session Time: 11:10am-12:05pm  Participation Level: Active  Behavioral Response: CasualAlertAnxious  Type of Therapy: Individual Therapy  Treatment Goals addressed: LTG: Reduce frequency, intensity, and duration of depression symptoms so that daily functioning is improved   STG: Annaleigha will identify cognitive patterns and beliefs that support depression   LTG: "I want to be where the things I don't have control over, I can learn to let go."    ProgressTowards Goals: Progressing  Interventions: CBT, Strength-based, Assertiveness Training, and Supportive  Summary: KALLIYAH KURR is a 42 y.o. female who presents with anxiety.  Patient identifies symptoms to include uncontrollable worry, negative self affect, low mood, anxious feelings, avoidance, restlessness. Pt was oriented times 5. Pt was cooperative and engaged. Pt denies SI/HI/AVH.     Patient utilized therapeutic space to process anxious feelings regarding upcoming procedure.  Patient expressed a fear of dying under anesthesia.  Worked with clinician to understand how to cope with these feelings as well as acknowledge what she can/cannot control.  Explored relationship with trust. Identified how childhood trauma is impacting anxiety around upcoming situations.  Addressed reframing unhelpful thoughts utilizing components of CBT.  Patient identified a goal to focus on what she can control and shift her perspective to a positive mentality.  Patient identified whom will be present for the surgery and postop.  She identifies she is surrounding herself with positive individuals who can assist in supporting her.  Reviewed coping skills and identified what patient can do to engage in positive distractions.  Patient identified goal to reach out to surgeon before surgery to ask important questions.  After session clinician observed patient was presenting as less anxious AEB patient smiling discussing surgery.  Patient  also demonstrating improvement and reframing her perspective and choosing to focus on positives.  Suicidal/Homicidal: Nowithout intent/plan  Therapist Response: Cln utilized active and supportive reflection to create a safe environment for patient to process recent life stressors.  Clinician assessed for current symptoms, stressors, safety since last session. Clinician supported patient in processing his feelings about her upcoming surgery.  Assisted patient in identifying what she can and cannot control.  Evaluated the root cause of her anxiety around control.  Processed how childhood trauma is impacting her mental health today.  Reviewed self-care and coping skills.  Plan: Return again in 3 weeks.  Diagnosis: PTSD (post-traumatic stress disorder)  MDD (major depressive disorder), recurrent episode, mild (HCC)  GAD (generalized anxiety disorder)   Collaboration of Care: AEB psychiatrist can access notes and cln. Will review psychiatrists' notes. Check in with the patient and will see LCSW per availability. Patient agreed with treatment recommendations.   Patient/Guardian was advised Release of Information must be obtained prior to any record release in order to collaborate their care with an outside provider. Patient/Guardian was advised if they have not already done so to contact the registration department to sign all necessary forms in order for [place] to release information regarding their care.   Consent: Patient/Guardian gives verbal consent for treatment and assignment of benefits for services provided during this visit. Patient/Guardian expressed understanding and agreed to proceed.   Dereck Leep, LCSW 04/12/2023

## 2023-04-13 ENCOUNTER — Ambulatory Visit (HOSPITAL_BASED_OUTPATIENT_CLINIC_OR_DEPARTMENT_OTHER)
Admission: RE | Admit: 2023-04-13 | Discharge: 2023-04-13 | Disposition: A | Discharge status: Home / Self Care | Payer: Medicare Other | Attending: Plastic Surgery | Admitting: Plastic Surgery

## 2023-04-13 ENCOUNTER — Ambulatory Visit (HOSPITAL_BASED_OUTPATIENT_CLINIC_OR_DEPARTMENT_OTHER): Payer: Self-pay | Admitting: Anesthesiology

## 2023-04-13 ENCOUNTER — Other Ambulatory Visit: Payer: Self-pay

## 2023-04-13 ENCOUNTER — Encounter (HOSPITAL_BASED_OUTPATIENT_CLINIC_OR_DEPARTMENT_OTHER): Payer: Self-pay | Admitting: Plastic Surgery

## 2023-04-13 ENCOUNTER — Encounter (HOSPITAL_BASED_OUTPATIENT_CLINIC_OR_DEPARTMENT_OTHER)
Admission: RE | Disposition: A | Discharge status: Home / Self Care | Payer: Self-pay | Source: Home / Self Care | Attending: Plastic Surgery

## 2023-04-13 DIAGNOSIS — K219 Gastro-esophageal reflux disease without esophagitis: Secondary | ICD-10-CM | POA: Diagnosis not present

## 2023-04-13 DIAGNOSIS — N62 Hypertrophy of breast: Secondary | ICD-10-CM

## 2023-04-13 DIAGNOSIS — J45909 Unspecified asthma, uncomplicated: Secondary | ICD-10-CM | POA: Insufficient documentation

## 2023-04-13 DIAGNOSIS — Z5986 Financial insecurity: Secondary | ICD-10-CM | POA: Diagnosis not present

## 2023-04-13 DIAGNOSIS — I10 Essential (primary) hypertension: Secondary | ICD-10-CM | POA: Insufficient documentation

## 2023-04-13 DIAGNOSIS — E66813 Obesity, class 3: Secondary | ICD-10-CM | POA: Insufficient documentation

## 2023-04-13 DIAGNOSIS — Z79899 Other long term (current) drug therapy: Secondary | ICD-10-CM | POA: Diagnosis not present

## 2023-04-13 DIAGNOSIS — Z8249 Family history of ischemic heart disease and other diseases of the circulatory system: Secondary | ICD-10-CM | POA: Insufficient documentation

## 2023-04-13 DIAGNOSIS — F32A Depression, unspecified: Secondary | ICD-10-CM | POA: Diagnosis not present

## 2023-04-13 DIAGNOSIS — M542 Cervicalgia: Secondary | ICD-10-CM

## 2023-04-13 DIAGNOSIS — F419 Anxiety disorder, unspecified: Secondary | ICD-10-CM | POA: Diagnosis not present

## 2023-04-13 DIAGNOSIS — E039 Hypothyroidism, unspecified: Secondary | ICD-10-CM | POA: Diagnosis not present

## 2023-04-13 DIAGNOSIS — I2510 Atherosclerotic heart disease of native coronary artery without angina pectoris: Secondary | ICD-10-CM | POA: Diagnosis not present

## 2023-04-13 DIAGNOSIS — R0601 Orthopnea: Secondary | ICD-10-CM | POA: Insufficient documentation

## 2023-04-13 DIAGNOSIS — G709 Myoneural disorder, unspecified: Secondary | ICD-10-CM | POA: Diagnosis not present

## 2023-04-13 DIAGNOSIS — Z01818 Encounter for other preprocedural examination: Secondary | ICD-10-CM

## 2023-04-13 DIAGNOSIS — Z91040 Latex allergy status: Secondary | ICD-10-CM | POA: Insufficient documentation

## 2023-04-13 DIAGNOSIS — Z6841 Body Mass Index (BMI) 40.0 and over, adult: Secondary | ICD-10-CM | POA: Diagnosis not present

## 2023-04-13 DIAGNOSIS — M549 Dorsalgia, unspecified: Secondary | ICD-10-CM | POA: Insufficient documentation

## 2023-04-13 HISTORY — PX: BREAST REDUCTION SURGERY: SHX8

## 2023-04-13 LAB — POCT PREGNANCY, URINE: Preg Test, Ur: NEGATIVE

## 2023-04-13 SURGERY — MAMMOPLASTY, REDUCTION
Anesthesia: General | Site: Breast | Laterality: Bilateral

## 2023-04-13 MED ORDER — SCOPOLAMINE 1 MG/3DAYS TD PT72
MEDICATED_PATCH | TRANSDERMAL | Status: AC
  Filled 2023-04-13: qty 1

## 2023-04-13 MED ORDER — OXYCODONE HCL 5 MG PO TABS
ORAL_TABLET | ORAL | Status: AC
  Filled 2023-04-13: qty 1

## 2023-04-13 MED ORDER — DEXAMETHASONE SODIUM PHOSPHATE 10 MG/ML IJ SOLN
INTRAMUSCULAR | Status: AC
  Filled 2023-04-13: qty 1

## 2023-04-13 MED ORDER — SODIUM CHLORIDE (PF) 0.9 % IJ SOLN
INTRAMUSCULAR | Status: DC | PRN
  Administered 2023-04-13: 100 mL via SURGICAL_CAVITY

## 2023-04-13 MED ORDER — OXYCODONE HCL 5 MG/5ML PO SOLN: 5.0000 mg | Freq: Once | ORAL | Status: AC | PRN

## 2023-04-13 MED ORDER — CELECOXIB 200 MG PO CAPS
200.0000 mg | ORAL_CAPSULE | Freq: Once | ORAL | Status: AC
Start: 2023-04-13 — End: 2023-04-13
  Administered 2023-04-13: 200 mg via ORAL

## 2023-04-13 MED ORDER — ONDANSETRON HCL 4 MG/2ML IJ SOLN: 4.0000 mg | Freq: Once | INTRAMUSCULAR | Status: DC | PRN

## 2023-04-13 MED ORDER — ACETAMINOPHEN 325 MG PO TABS: 325.0000 mg | ORAL_TABLET | ORAL | Status: DC | PRN

## 2023-04-13 MED ORDER — CHLORHEXIDINE GLUCONATE CLOTH 2 % EX PADS: 6.0000 | MEDICATED_PAD | Freq: Once | CUTANEOUS | Status: DC

## 2023-04-13 MED ORDER — ROCURONIUM BROMIDE 100 MG/10ML IV SOLN
INTRAVENOUS | Status: DC | PRN
  Administered 2023-04-13: 70 mg via INTRAVENOUS

## 2023-04-13 MED ORDER — FENTANYL CITRATE (PF) 100 MCG/2ML IJ SOLN
INTRAMUSCULAR | Status: AC
  Filled 2023-04-13: qty 2

## 2023-04-13 MED ORDER — CEFAZOLIN SODIUM-DEXTROSE 2-4 GM/100ML-% IV SOLN
2.0000 g | INTRAVENOUS | Status: AC
Start: 2023-04-13 — End: 2023-04-13
  Administered 2023-04-13: 2 g via INTRAVENOUS

## 2023-04-13 MED ORDER — SUGAMMADEX SODIUM 200 MG/2ML IV SOLN
INTRAVENOUS | Status: DC | PRN
  Administered 2023-04-13: 200 mg via INTRAVENOUS

## 2023-04-13 MED ORDER — EPHEDRINE 5 MG/ML INJ
INTRAVENOUS | Status: AC
  Filled 2023-04-13: qty 5

## 2023-04-13 MED ORDER — OXYCODONE HCL 5 MG PO TABS
5.0000 mg | ORAL_TABLET | Freq: Once | ORAL | Status: AC | PRN
  Administered 2023-04-13: 5 mg via ORAL

## 2023-04-13 MED ORDER — PHENYLEPHRINE 80 MCG/ML (10ML) SYRINGE FOR IV PUSH (FOR BLOOD PRESSURE SUPPORT)
PREFILLED_SYRINGE | INTRAVENOUS | Status: AC
  Filled 2023-04-13: qty 10

## 2023-04-13 MED ORDER — CELECOXIB 200 MG PO CAPS
ORAL_CAPSULE | ORAL | Status: AC
  Filled 2023-04-13: qty 1

## 2023-04-13 MED ORDER — SUCCINYLCHOLINE CHLORIDE 200 MG/10ML IV SOSY
PREFILLED_SYRINGE | INTRAVENOUS | Status: AC
  Filled 2023-04-13: qty 10

## 2023-04-13 MED ORDER — PROPOFOL 10 MG/ML IV BOLUS
INTRAVENOUS | Status: DC | PRN
  Administered 2023-04-13: 200 mg via INTRAVENOUS

## 2023-04-13 MED ORDER — 0.9 % SODIUM CHLORIDE (POUR BTL) OPTIME
TOPICAL | Status: DC | PRN
  Administered 2023-04-13: 1000 mL

## 2023-04-13 MED ORDER — MIDAZOLAM HCL 2 MG/2ML IJ SOLN
INTRAMUSCULAR | Status: AC
Start: 2023-04-13 — End: ?
  Filled 2023-04-13: qty 2

## 2023-04-13 MED ORDER — HYDROMORPHONE HCL 1 MG/ML IJ SOLN
INTRAMUSCULAR | Status: DC | PRN
  Administered 2023-04-13 (×3): .5 mg via INTRAVENOUS

## 2023-04-13 MED ORDER — FENTANYL CITRATE (PF) 100 MCG/2ML IJ SOLN
INTRAMUSCULAR | Status: DC | PRN
  Administered 2023-04-13 (×2): 100 ug via INTRAVENOUS

## 2023-04-13 MED ORDER — ONDANSETRON HCL 4 MG/2ML IJ SOLN
INTRAMUSCULAR | Status: AC
  Filled 2023-04-13: qty 2

## 2023-04-13 MED ORDER — FENTANYL CITRATE (PF) 100 MCG/2ML IJ SOLN
25.0000 ug | INTRAMUSCULAR | Status: DC | PRN
  Administered 2023-04-13 (×2): 50 ug via INTRAVENOUS
  Administered 2023-04-13: 25 ug via INTRAVENOUS

## 2023-04-13 MED ORDER — SODIUM CHLORIDE 0.9 % IV SOLN: INTRAVENOUS | Status: DC | PRN

## 2023-04-13 MED ORDER — ACETAMINOPHEN 500 MG PO TABS
ORAL_TABLET | ORAL | Status: AC
  Filled 2023-04-13: qty 2

## 2023-04-13 MED ORDER — LIDOCAINE 2% (20 MG/ML) 5 ML SYRINGE
INTRAMUSCULAR | Status: AC
  Filled 2023-04-13: qty 5

## 2023-04-13 MED ORDER — HYDROMORPHONE HCL 1 MG/ML IJ SOLN
INTRAMUSCULAR | Status: AC
  Filled 2023-04-13: qty 0.5

## 2023-04-13 MED ORDER — MIDAZOLAM HCL 5 MG/5ML IJ SOLN
INTRAMUSCULAR | Status: DC | PRN
  Administered 2023-04-13: 2 mg via INTRAVENOUS

## 2023-04-13 MED ORDER — DOXYCYCLINE HYCLATE 100 MG PO TABS: 100.0000 mg | ORAL_TABLET | Freq: Two times a day (BID) | ORAL | 0 refills | Status: AC

## 2023-04-13 MED ORDER — BUPIVACAINE HCL (PF) 0.25 % IJ SOLN
INTRAMUSCULAR | Status: AC
  Filled 2023-04-13: qty 30

## 2023-04-13 MED ORDER — DEXMEDETOMIDINE HCL IN NACL 80 MCG/20ML IV SOLN
INTRAVENOUS | Status: DC | PRN
  Administered 2023-04-13 (×2): 4 ug via INTRAVENOUS

## 2023-04-13 MED ORDER — ACETAMINOPHEN 500 MG PO TABS
1000.0000 mg | ORAL_TABLET | Freq: Once | ORAL | Status: AC
Start: 2023-04-13 — End: 2023-04-13
  Administered 2023-04-13: 1000 mg via ORAL

## 2023-04-13 MED ORDER — LACTATED RINGERS IV SOLN: INTRAVENOUS | Status: DC

## 2023-04-13 MED ORDER — CEFAZOLIN SODIUM-DEXTROSE 2-4 GM/100ML-% IV SOLN
INTRAVENOUS | Status: AC
  Filled 2023-04-13: qty 100

## 2023-04-13 MED ORDER — MEPERIDINE HCL 25 MG/ML IJ SOLN: 6.2500 mg | INTRAMUSCULAR | Status: DC | PRN

## 2023-04-13 MED ORDER — BUPIVACAINE LIPOSOME 1.3 % IJ SUSP
INTRAMUSCULAR | Status: AC
  Filled 2023-04-13: qty 20

## 2023-04-13 MED ORDER — ATROPINE SULFATE 0.4 MG/ML IV SOLN
INTRAVENOUS | Status: AC
  Filled 2023-04-13: qty 1

## 2023-04-13 MED ORDER — ACETAMINOPHEN 160 MG/5ML PO SOLN: 325.0000 mg | ORAL | Status: DC | PRN

## 2023-04-13 MED ORDER — SODIUM CHLORIDE (PF) 0.9 % IJ SOLN
INTRAMUSCULAR | Status: AC
  Filled 2023-04-13: qty 10

## 2023-04-13 MED ORDER — ONDANSETRON HCL 4 MG/2ML IJ SOLN
INTRAMUSCULAR | Status: DC | PRN
  Administered 2023-04-13: 4 mg via INTRAVENOUS

## 2023-04-13 MED ORDER — PROPOFOL 500 MG/50ML IV EMUL
INTRAVENOUS | Status: DC | PRN
  Administered 2023-04-13: 30 ug/kg/min via INTRAVENOUS

## 2023-04-13 MED ORDER — DEXAMETHASONE SODIUM PHOSPHATE 4 MG/ML IJ SOLN
INTRAMUSCULAR | Status: DC | PRN
  Administered 2023-04-13: 5 mg via INTRAVENOUS

## 2023-04-13 MED ORDER — LIDOCAINE HCL (CARDIAC) PF 100 MG/5ML IV SOSY
PREFILLED_SYRINGE | INTRAVENOUS | Status: DC | PRN
  Administered 2023-04-13: 60 mg via INTRAVENOUS

## 2023-04-13 MED ORDER — ROCURONIUM BROMIDE 10 MG/ML (PF) SYRINGE
PREFILLED_SYRINGE | INTRAVENOUS | Status: AC
  Filled 2023-04-13: qty 10

## 2023-04-13 MED ORDER — SCOPOLAMINE 1 MG/3DAYS TD PT72
1.0000 | MEDICATED_PATCH | TRANSDERMAL | Status: DC
  Administered 2023-04-13: 1.5 mg via TRANSDERMAL

## 2023-04-13 SURGICAL SUPPLY — 58 items
BINDER BREAST 3XL (GAUZE/BANDAGES/DRESSINGS) IMPLANT
BINDER BREAST LRG (GAUZE/BANDAGES/DRESSINGS) IMPLANT
BINDER BREAST MEDIUM (GAUZE/BANDAGES/DRESSINGS) IMPLANT
BINDER BREAST XLRG (GAUZE/BANDAGES/DRESSINGS) IMPLANT
BINDER BREAST XXLRG (GAUZE/BANDAGES/DRESSINGS) IMPLANT
BIOPATCH RED 1 DISK 7.0 (GAUZE/BANDAGES/DRESSINGS) ×2 IMPLANT
BLADE SURG 10 STRL SS (BLADE) ×6 IMPLANT
BLADE SURG 15 STRL LF DISP TIS (BLADE) ×1 IMPLANT
CANISTER SUCT 1200ML W/VALVE (MISCELLANEOUS) ×1 IMPLANT
DERMABOND ADVANCED .7 DNX12 (GAUZE/BANDAGES/DRESSINGS) ×2 IMPLANT
DRAIN CHANNEL 19F RND (DRAIN) ×2 IMPLANT
DRAPE IMP U-DRAPE 54X76 (DRAPES) IMPLANT
DRAPE UTILITY XL STRL (DRAPES) ×1 IMPLANT
DRSG TEGADERM 4X4.75 (GAUZE/BANDAGES/DRESSINGS) ×2 IMPLANT
ELECT BLADE 4.0 EZ CLEAN MEGAD (MISCELLANEOUS) ×1
ELECT REM PT RETURN 9FT ADLT (ELECTROSURGICAL) ×2
ELECTRODE BLDE 4.0 EZ CLN MEGD (MISCELLANEOUS) ×1 IMPLANT
ELECTRODE REM PT RTRN 9FT ADLT (ELECTROSURGICAL) ×2 IMPLANT
EVACUATOR SILICONE 100CC (DRAIN) ×2 IMPLANT
GAUZE PAD ABD 8X10 STRL (GAUZE/BANDAGES/DRESSINGS) ×4 IMPLANT
GAUZE SPONGE 2X2 STRL 8-PLY (GAUZE/BANDAGES/DRESSINGS) ×2 IMPLANT
GLOVE BIO SURGEON STRL SZ 6.5 (GLOVE) IMPLANT
GLOVE BIO SURGEON STRL SZ7.5 (GLOVE) IMPLANT
GLOVE BIO SURGEON STRL SZ8 (GLOVE) ×1 IMPLANT
GLOVE BIOGEL PI IND STRL 7.0 (GLOVE) IMPLANT
GLOVE BIOGEL PI IND STRL 8 (GLOVE) ×1 IMPLANT
GLOVE SURG SS PI 6.5 STRL IVOR (GLOVE) IMPLANT
GLOVE SURG SYN 8.0 (GLOVE) ×1
GLOVE SURG SYN 8.0 PF PI (GLOVE) IMPLANT
GOWN STRL REUS W/ TWL LRG LVL3 (GOWN DISPOSABLE) ×1 IMPLANT
GOWN STRL REUS W/TWL XL LVL3 (GOWN DISPOSABLE) ×1 IMPLANT
HEMOSTAT ARISTA ABSORB 3G PWDR (HEMOSTASIS) IMPLANT
HIBICLENS CHG 4% 4OZ BTL (MISCELLANEOUS) ×1 IMPLANT
MARKER SKIN DUAL TIP RULER LAB (MISCELLANEOUS) ×1 IMPLANT
NDL HYPO 22X1.5 SAFETY MO (MISCELLANEOUS) ×1 IMPLANT
NEEDLE HYPO 22X1.5 SAFETY MO (MISCELLANEOUS) ×2
NS IRRIG 1000ML POUR BTL (IV SOLUTION) ×1 IMPLANT
PACK BASIN DAY SURGERY FS (CUSTOM PROCEDURE TRAY) ×1 IMPLANT
PACK UNIVERSAL I (CUSTOM PROCEDURE TRAY) ×1 IMPLANT
PENCIL SMOKE EVACUATOR (MISCELLANEOUS) ×2 IMPLANT
PIN SAFETY STERILE (MISCELLANEOUS) ×1 IMPLANT
SLEEVE SCD COMPRESS KNEE MED (STOCKING) ×1 IMPLANT
SPONGE T-LAP 18X18 ~~LOC~~+RFID (SPONGE) ×3 IMPLANT
STAPLER SKIN PROX WIDE 3.9 (STAPLE) ×1 IMPLANT
SUT MNCRL AB 3-0 PS2 27 (SUTURE) ×4 IMPLANT
SUT MNCRL AB 4-0 PS2 18 (SUTURE) ×4 IMPLANT
SUT MON AB 2-0 CT1 36 (SUTURE) ×1 IMPLANT
SUT MON AB 5-0 PS2 18 (SUTURE) IMPLANT
SUT SILK 2 0 SH (SUTURE) ×2 IMPLANT
SUT VIC AB 3-0 SH 27X BRD (SUTURE) IMPLANT
SYR 20ML LL LF (SYRINGE) IMPLANT
SYR BULB IRRIG 60ML STRL (SYRINGE) ×1 IMPLANT
SYR CONTROL 10ML LL (SYRINGE) ×1 IMPLANT
TOWEL GREEN STERILE FF (TOWEL DISPOSABLE) ×2 IMPLANT
TRAY DSU PREP LF (CUSTOM PROCEDURE TRAY) ×1 IMPLANT
TUBE CONNECTING 20X1/4 (TUBING) ×1 IMPLANT
UNDERPAD 30X36 HEAVY ABSORB (UNDERPADS AND DIAPERS) ×2 IMPLANT
YANKAUER SUCT BULB TIP NO VENT (SUCTIONS) ×1 IMPLANT

## 2023-04-13 NOTE — Anesthesia Procedure Notes (Addendum)
Procedure Name: Intubation Date/Time: 04/13/2023 12:14 PM  Performed by: Ronnette Hila, CRNAPre-anesthesia Checklist: Patient identified, Emergency Drugs available, Suction available and Patient being monitored Patient Re-evaluated:Patient Re-evaluated prior to induction Oxygen Delivery Method: Circle system utilized Preoxygenation: Pre-oxygenation with 100% oxygen Induction Type: IV induction Ventilation: Mask ventilation without difficulty Laryngoscope Size: Mac and 3 Grade View: Grade I Tube type: Oral Number of attempts: 1 Airway Equipment and Method: Stylet and Oral airway Placement Confirmation: ETT inserted through vocal cords under direct vision, positive ETCO2 and breath sounds checked- equal and bilateral Tube secured with: Tape Dental Injury: Teeth and Oropharynx as per pre-operative assessment  Comments: Pt stated she did not have dentures or anything removable in her mouth to both CRNA and MDA. Upon induction lower dentures came out. Attempt was made to remove the upper dentures without success. Atraumatic intubation and upper dentures in place.

## 2023-04-13 NOTE — Discharge Instructions (Addendum)
INSTRUCTIONS FOR AFTER BREAST SURGERY   You will likely have some questions about what to expect following your operation.  The following information will help you and your family understand what to expect when you are discharged from the hospital.  It is important to follow these guidelines to help ensure a smooth recovery and reduce complication.  Postoperative instructions include information on: diet, wound care, medications and physical activity.  AFTER SURGERY Expect to go home after the procedure.  In some cases, you may need to spend one night in the hospital for observation.  DIET Breast surgery does not require a specific diet.  However, the healthier you eat the better your body will heal. It is important to increasing your protein intake.  This means limiting the foods with sugar and carbohydrates.  Focus on vegetables and some meat.  If you have liposuction during your procedure be sure to drink water.  If your urine is bright yellow, then it is concentrated, and you need to drink more water.  As a general rule after surgery, you should have 8 ounces of water every hour while awake.  If you find you are persistently nauseated or unable to take in liquids let us know.  NO TOBACCO USE or EXPOSURE.  This will slow your healing process and lead to a wound.  WOUND CARE Leave the binder on at all times except when showering . Use fragrance free soap like Dial, Dove or [place].   After 24 hours you can remove the binder to shower. Once dry apply binder or sports bra. No baths, pools or hot tubs for four weeks. We close your incision to leave the smallest and best-looking scar. No ointment or creams on your incisions for four weeks.  No Neosporin (Too many skin reactions).  A few weeks after surgery you can use Mederma and start massaging the scar. We ask you to wear your binder or sports bra for the first 6 weeks around the clock, including while sleeping. This provides added comfort and helps  reduce the fluid accumulation at the surgery site. NO Ice or heating pads to the operative site.  You have a very high risk of a BURN before you feel the temperature change. Continue to empty, recharge, & record drainage from drains 2-3 times a day, as needed.  ACTIVITY No heavy lifting until cleared by the doctor.  This usually means no more than a half-gallon of milk.  It is OK to walk and climb stairs. Moving your legs is very important to decrease your risk of a blood clot.  It will also help keep you from getting deconditioned.  Every 1 to 2 hours get up and walk for 5 minutes. This will help with a quicker recovery back to normal.  Let pain be your guide so you don't do too much.  This time is for you to recover.  You will be more comfortable if you sleep and rest with your head elevated either with a few pillows under you or in a recliner.  No stomach sleeping for a three months.  WORK Everyone returns to work at different times. As a rough guide, most people take at least 1 - 2 weeks off prior to returning to work. If you need documentation for your job, give the forms to the front staff at the clinic.  DRIVING Arrange for someone to bring you home from the hospital after your surgery.  You may be able to drive a few days after  surgery but not while taking any narcotics or valium.  BOWEL MOVEMENTS Constipation can occur after anesthesia and while taking pain medication.  It is important to stay ahead for your comfort.  We recommend taking Milk of Magnesia (2 tablespoons; twice a day) while taking the pain pills.  MEDICATIONS You may be prescribed should start after surgery At your preoperative visit for you history and physical you may have been given the following medications: Zofran 4 mg:  This is to treat nausea and vomiting.  You can take this every 6 hours as needed and only if needed. Oxycodone 5 mg:  This is only to be used after you have taken the Motrin or the Tylenol. Every 8  hours as needed. Doxycycline: Take twice daily as directed for five days.    Over the counter Medication to take: Ibuprofen (Motrin) 600 mg:  Take this every 6 hours.  If you have additional pain then take 500 mg of the Tylenol every 8 hours.  Only take the Norco after you have tried these two. MiraLAX or Milk of Magnesia: Take this according to the bottle if you take the Norco.  WHEN TO CALL Call your surgeon's office if any of the following occur: Fever 101 degrees F or greater Excessive bleeding or fluid from the incision site. Pain that increases over time without aid from the medications Redness, warmth, or pus draining from incision sites Persistent nausea or inability to take in liquids Severe misshapen area that underwent the operation.  Here are some resources for breast cancer patients:  Plastic surgery website: https://www.plasticsurgery.org/for-medical-professionals/education-and-resources/publications/breast-reconstruction-magazine Breast Reconstruction Awareness Campaign:  ChessCont[employer]fr Plastic surgery Implant information:  https://www.plasticsurgery.org/patient-safety/breast-implant-safety    Post Anesthesia Home Care Instructions  Activity: Get plenty of rest for the remainder of the day. A responsible individual must stay with you for 24 hours following the procedure.  For the next 24 hours, DO NOT: -Drive a car -Advertising copywriter -Drink alcoholic beverages -Take any medication unless instructed by your physician -Make any legal decisions or sign important papers.  Meals: Start with liquid foods such as gelatin or soup. Progress to regular foods as tolerated. Avoid greasy, spicy, heavy foods. If nausea and/or vomiting occur, drink only clear liquids until the nausea and/or vomiting subsides. Call your physician if vomiting continues.  Special Instructions/Symptoms: Your throat may feel dry or sore from the anesthesia or the breathing tube  placed in your throat during surgery. If this causes discomfort, gargle with warm salt water. The discomfort should disappear within 24 hours.  If you had a scopolamine patch placed behind your ear for the management of post- operative nausea and/or vomiting:  1. The medication in the patch is effective for 72 hours, after which it should be removed.  Wrap patch in a tissue and discard in the trash. Wash hands thoroughly with soap and water. 2. You may remove the patch earlier than 72 hours if you experience unpleasant side effects which may include dry mouth, dizziness or visual disturbances. 3. Avoid touching the patch. Wash your hands with soap and water after contact with the patch.    Information for Discharge Teaching: EXPAREL (bupivacaine liposome injectable suspension)   Pain relief is important to your recovery. The goal is to control your pain so you can move easier and return to your normal activities as soon as possible after your procedure. Your physician may use several types of medicines to manage pain, swelling, and more.  Your surgeon or anesthesiologist gave you EXPAREL(bupivacaine) to help  control your pain after surgery.  EXPAREL is a local anesthetic designed to release slowly over an extended period of time to provide pain relief by numbing the tissue around the surgical site. EXPAREL is designed to release pain medication over time and can control pain for up to 72 hours. Depending on how you respond to EXPAREL, you may require less pain medication during your recovery. EXPAREL can help reduce or eliminate the need for opioids during the first few days after surgery when pain relief is needed the most. EXPAREL is not an opioid and is not addictive. It does not cause sleepiness or sedation.   Important! A teal colored band has been placed on your arm with the date, time and amount of EXPAREL you have received. Please leave this armband in place for the full 96 hours  following administration, and then you may remove the band. If you return to the hospital for any reason within 96 hours following the administration of EXPAREL, the armband provides important information that your health care providers to know, and alerts them that you have received this anesthetic.    Possible side effects of EXPAREL: Temporary loss of sensation or ability to move in the area where medication was injected. Nausea, vomiting, constipation Rarely, numbness and tingling in your mouth or lips, lightheadedness, or anxiety may occur. Call your doctor right away if you think you may be experiencing any of these sensations, or if you have other questions regarding possible side effects.  Follow all other discharge instructions given to you by your surgeon or nurse. Eat a healthy diet and drink plenty of water or other fluids.  About my Jackson-Pratt Bulb Drain  What is a Jackson-Pratt bulb? A Jackson-Pratt is a soft, round device used to collect drainage. It is connected to a long, thin drainage catheter, which is held in place by one or two small stiches near your surgical incision site. When the bulb is squeezed, it forms a vacuum, forcing the drainage to empty into the bulb.  Emptying the Jackson-Pratt bulb- To empty the bulb: 1. Release the plug on the top of the bulb. 2. Pour the bulb's contents into a measuring container which your nurse will provide. 3. Record the time emptied and amount of drainage. Empty the drain(s) as often as your     doctor or nurse recommends.  Date                  Time                    Amount (Drain 1)                 Amount (Drain  2)  _____________________________________________________________________  _____________________________________________________________________  _____________________________________________________________________  _____________________________________________________________________  _____________________________________________________________________  _____________________________________________________________________  _____________________________________________________________________  _____________________________________________________________________  Squeezing the Jackson-Pratt Bulb- To squeeze the bulb: 1. Make sure the plug at the top of the bulb is open. 2. Squeeze the bulb tightly in your fist. You will hear air squeezing from the bulb. 3. Replace the plug while the bulb is squeezed. 4. Use a safety pin to attach the bulb to your clothing. This will keep the catheter from     pulling at the bulb insertion site.  When to call your doctor- Call your doctor if: Drain site becomes red, swollen or hot. You have a fever greater than 101 degrees F. There is oozing at the drain site. Drain falls out (apply a guaze bandage over the drain  hole and secure it with tape). Drainage increases daily not related to activity patterns. (You will usually have more drainage when you are active than when you are resting.) Drainage has a bad odor.  Next dose of tylenol if needed after 5pm

## 2023-04-13 NOTE — Anesthesia Preprocedure Evaluation (Addendum)
Anesthesia Evaluation  Patient identified by MRN, date of birth, ID band Patient awake    Reviewed: Allergy & Precautions, H&P , NPO status , Patient's Chart, lab work & pertinent test results, reviewed documented beta blocker date and time   History of Anesthesia Complications (+) PONV and history of anesthetic complications  Airway Mallampati: II  TM Distance: >3 FB Neck ROM: full    Dental  (+) Teeth Intact, Dental Advisory Given   Pulmonary asthma    Pulmonary exam normal        Cardiovascular hypertension, Pt. on medications + CAD and + Orthopnea  Normal cardiovascular exam Rate:Normal     Neuro/Psych  PSYCHIATRIC DISORDERS Anxiety Depression     Neuromuscular disease    GI/Hepatic Neg liver ROS,GERD  Medicated and Controlled,,  Endo/Other  Hypothyroidism  Class 3 obesity  Renal/GU negative Renal ROS  negative genitourinary   Musculoskeletal negative musculoskeletal ROS (+)    Abdominal  (+) + obese  Peds  Hematology negative hematology ROS (+)   Anesthesia Other Findings   Reproductive/Obstetrics                             Anesthesia Physical Anesthesia Plan  ASA: 3  Anesthesia Plan: General   Post-op Pain Management: Tylenol PO (pre-op)* and Celebrex PO (pre-op)*   Induction: Intravenous  PONV Risk Score and Plan: 4 or greater and Ondansetron, Dexamethasone, Treatment may vary due to age or medical condition, Scopolamine patch - Pre-op and Midazolam  Airway Management Planned: Oral ETT  Additional Equipment: None  Intra-op Plan:   Post-operative Plan: Extubation in OR  Informed Consent: I have reviewed the patients History and Physical, chart, labs and discussed the procedure including the risks, benefits and alternatives for the proposed anesthesia with the patient or authorized representative who has indicated his/her understanding and acceptance.     Dental  Advisory Given  Plan Discussed with: CRNA and Anesthesiologist  Anesthesia Plan Comments: (  )        Anesthesia Quick Evaluation

## 2023-04-13 NOTE — Transfer of Care (Signed)
Immediate Anesthesia Transfer of Care Note  Patient: Catherine Berg  Procedure(s) Performed: MAMMARY REDUCTION  (BREAST) (Bilateral: Breast)  Patient Location: PACU  Anesthesia Type:General  Level of Consciousness: awake, alert , and oriented  Airway & Oxygen Therapy: Patient Spontanous Breathing and Patient connected to face mask oxygen  Post-op Assessment: Report given to RN and Post -op Vital signs reviewed and stable  Post vital signs: Reviewed and stable  Last Vitals:  Vitals Value Taken Time  BP 135/87 04/13/23 1615  Temp    Pulse 120 04/13/23 1615  Resp 14 04/13/23 1615  SpO2 100 % 04/13/23 1615    Last Pain:  Vitals:   04/13/23 1615  TempSrc:   PainSc: 9          Complications: No notable events documented.

## 2023-04-13 NOTE — Interval H&P Note (Signed)
History and Physical Interval Note: No change in exam or indication for surgery. Evaluated left axilla, no active drainage. Marked for a bilateral breast reduction with her assistance. All questions answered. Will proceed at her request  04/13/2023 11:47 AM  Catherine Berg  has presented today for surgery, with the diagnosis of macromastia.  The various methods of treatment have been discussed with the patient and family. After consideration of risks, benefits and other options for treatment, the patient has consented to  Procedure(s): MAMMARY REDUCTION  (BREAST) (Bilateral) as a surgical intervention.  The patient's history has been reviewed, patient examined, no change in status, stable for surgery.  I have reviewed the patient's chart and labs.  Questions were answered to the patient's satisfaction.     [name]

## 2023-04-13 NOTE — Op Note (Signed)
DATE OF OPERATION: 04/13/2023  LOCATION: [facility] operating Room  PREOPERATIVE DIAGNOSIS: Symptomatic macromastia  POSTOPERATIVE DIAGNOSIS: Same  PROCEDURE: Bilateral breast reduction  SURGEON: Loren Racer, MD  ASSISTANT: Caroline More  EBL: 150 cc  CONDITION: Stable  COMPLICATIONS: None  INDICATION: The patient, Catherine Berg, is a 42 y.o. female born on [birth year], is here for treatment of upper back and neck pain secondary to large breast size.   PROCEDURE DETAILS:  The patient was seen prior to surgery and marked.   IV antibiotics were given. The patient was taken to the operating room and given a general anesthetic. A standard time out was performed and all information was confirmed by those in the room. SCDs were placed.   The chest was prepped and draped in usual sterile manner.  The nipple and areolar complexes were marked with a 42 mm cookie cutter and 8 cm based inferior pedicle was outlined on the breast.  The right breast was addressed first.  Laparotomy tape was placed at the base of breast as a tourniquet and the pedicle was de-epithelialized sharply.  The electrocautery was then used to dissected the borders of the pedicle down to the chest wall.  The electrocautery was then used to resect the medial lateral and superior triangles of breast tissue into the develop and thin the superior skin flap.  The breast tissue removed constituted the bulk of the reduction and weighed 1674 g.  The surgical wound was inspected for bleeding and hemostasis achieved with the electrocautery.  The wound was then irrigated with warm normal saline.  A mixture of Exparel with quarter percent plain Marcaine and 50 mL of injectable saline was then injected in to the pectoralis under the fascia and into the subcutaneous tissues surrounding the breast.  A 19 French round drain was placed behind the pedicle and brought out through a separate stab incision.  The T point was approximated with a  single 2-0 Monocryl suture and the skin edges tailor tacked in place with skin clips the dermis was closed with interrupted and running 3-0 Monocryl sutures and the skin was closed with a running 4-0 Monocryl subcuticular stitch.  Attention was turned to the left breast where similar procedure was performed.  After placing a laparotomy tape at the base of the breast as a tourniquet the pedicle was de-epithelialized sharply and then dissected to the chest wall with the electrocautery.  The medial lateral and superior triangles of breast tissue were resected and the superior skin flap developed and thinned with the electrocautery.  The total breast tissue removed was 1568 g.  Again hemostasis was achieved and the subfascial space and subcutaneous tissues infiltrated with the Exparel mixture.  19 French round drain was placed behind the pedicle and brought out through separate stab incision in the T point approximated with a 2-0 Monocryl suture.  After tailor tacking the skin edges in place the dermis was closed with interrupted and running 3-0 Monocryl sutures and the skin was closed with a running 4-0 Monocryl subcuticular stitch.  The incisions were sealed with Dermabond.  The patient was placed in a supportive compressive garment and awakened from anesthesia without incident.  She was transferred to the recovery room in good condition.  All instrument needle and sponge counts were reported as correct and there were no complications.  The breast tissue was sent to pathology for routine examination. The patient was allowed to wake up and taken to recovery room in stable condition at the  end of the case. The family was notified at the end of the case.   The advanced practice practitioner (APP) assisted throughout the case.  The APP was essential in retraction and counter traction when needed to make the case progress smoothly.  This retraction and assistance made it possible to see the tissue plans for the  procedure.  The assistance was needed for blood control, tissue re-approximation and assisted with closure of the incision site.

## 2023-04-13 NOTE — Anesthesia Postprocedure Evaluation (Signed)
Anesthesia Post Note  Patient: Catherine Berg  Procedure(s) Performed: MAMMARY REDUCTION  (BREAST) (Bilateral: Breast)     Patient location during evaluation: PACU Anesthesia Type: General Level of consciousness: awake and alert Pain management: pain level controlled Vital Signs Assessment: post-procedure vital signs reviewed and stable Respiratory status: spontaneous breathing, nonlabored ventilation and respiratory function stable Cardiovascular status: stable and blood pressure returned to baseline Anesthetic complications: no   No notable events documented.  Last Vitals:  Vitals:   04/13/23 1700 04/13/23 1715  BP: 122/73 133/78  Pulse: 86 82  Resp: 14 13  Temp:    SpO2: 94% 95%    Last Pain:  Vitals:   04/13/23 1700  TempSrc:   PainSc: 4                  Beryle Lathe

## 2023-04-14 ENCOUNTER — Encounter (HOSPITAL_BASED_OUTPATIENT_CLINIC_OR_DEPARTMENT_OTHER): Payer: Self-pay | Admitting: Plastic Surgery

## 2023-04-14 ENCOUNTER — Ambulatory Visit: Payer: Medicare Other | Admitting: Plastic Surgery

## 2023-04-14 DIAGNOSIS — Z9889 Other specified postprocedural states: Secondary | ICD-10-CM

## 2023-04-14 NOTE — Progress Notes (Signed)
[name] returns today 1 day postop from a bilateral breast reduction.  She is doing well.  The sore she already notes an improvement in her shoulders and neck.  The incisions are intact.  The nipples are warm and well-perfused.  Drain output is serosanguineous.  She reports minimal output.  Drains were removed today without difficulty.  We discussed the importance of continued compression and continued ambulation.  Keep scheduled appointment in 1 week call for any questions or concerns.

## 2023-04-15 LAB — SURGICAL PATHOLOGY

## 2023-04-16 ENCOUNTER — Encounter: Payer: Self-pay | Admitting: Student

## 2023-04-16 NOTE — Progress Notes (Signed)
Patient called the on-call service with concerns about back pain. She states that she sometimes has sharp back pain since her surgery. She denies any chest pain or shortness of breath. She denies fevers or chills. She denies any nausea or vomiting. Reports she has been up and ambulating without any difficulty.   Discussed with patient her pain could be muscular. Recommended she do some gentle range of her upper extremities. I discussed with her that if her back pain persists or worsens, she should follow up with her PCP or go to the emergency room. Patient expressed understanding.

## 2023-04-18 ENCOUNTER — Encounter: Payer: Self-pay | Admitting: Plastic Surgery

## 2023-04-19 ENCOUNTER — Other Ambulatory Visit: Payer: Self-pay | Admitting: Psychiatry

## 2023-04-19 NOTE — Telephone Encounter (Signed)
I called the patient in regards to her MyChart message.  She states that she has been having a little bit of drainage from the lateral aspect of her right breast.  She states that her right breast is slightly more swollen than her left breast, but not significantly more swollen.  Discussed with patient that she can apply gauze, ABD pad or maxi pad over area of drainage.  Discussed with her that a little bit of drainage is normal.  Discussed with her to continue to monitor her breasts.  She states she has an appointment on Wednesday.  Discussed with her that if she has any concerns, questions or any worsening symptoms before then, she can let us know, otherwise she can come in on Wednesday.  Patient expressed understanding was in agreement with this plan.

## 2023-04-21 ENCOUNTER — Ambulatory Visit: Payer: Medicare Other | Admitting: Student

## 2023-04-21 ENCOUNTER — Telehealth: Payer: Self-pay | Admitting: Plastic Surgery

## 2023-04-21 ENCOUNTER — Encounter: Payer: Medicare Other | Admitting: Plastic Surgery

## 2023-04-21 VITALS — BP 133/87 | HR 93

## 2023-04-21 DIAGNOSIS — Z9889 Other specified postprocedural states: Secondary | ICD-10-CM

## 2023-04-21 NOTE — Progress Notes (Signed)
Patient is a 42 year old female here for follow-up after bilateral breast reduction with Dr. Ladona Ridgel on 04/13/2023.  She is 8 days postop.  She was last seen here in the office on 04/14/2023, postop day 1.  She had bilateral JP drains removed.  Today, patient patient reports she is doing okay.  She states that she is still having some pain, especially noted on the incision.  She states that she has a little bit of drainage from the incision.  She also states that she is concerned about some bulging she is having on the lateral aspects of her breast.  She denies any fevers or chills.  She states that she does sometimes feel little nauseated, but denies any vomiting.  She reports that her appetite comes and goes.  Chaperone present on exam.  On exam, patient is sitting upright in no acute distress.  Breasts are soft and fairly symmetric.  There is some swelling noted bilaterally.  There is no overlying erythema.  No obvious fluid collections palpated on exam.  No sign of fluid collections out laterally.  NAC's are healthy bilaterally, sensation intact bilaterally.  Incisions are clean dry and intact.  There is some dry skin noted inferior to the inframammary incision.  Breasts are mildly tender to palpation diffusely.  There are no signs of infection on exam.  Vitals taken at today's visit.  Patient is afebrile (temperature was 98.3 F) and vitals are stable. Vitals:   04/21/23 1454  BP: 133/87  Pulse: 93  SpO2: 96%    Discussed with patient that I do not see any acutely concerning signs on exam today.  I did discuss with her that she does still have some swelling bilaterally, no obvious fluid collections on exam.  I recommended that she continue to alternate Tylenol and ibuprofen for pain.  Recommended she continue compression at all times.  Also discussed with her that she may apply lotion to the skin to her upper abdomen where it is dry.  Discussed with the patient that if she has any fevers,  chills, worsening symptoms, or feels worse, to let us know.  Patient expressed understanding.  I would like the patient to follow back up next week.  Pictures were obtained of the patient and placed in the chart with the patient's or guardian's permission.

## 2023-04-21 NOTE — Progress Notes (Deleted)
Patient is a 42 year old female here for follow-up after bilateral breast reduction with Dr. Ladona Ridgel on 04/13/2023.  She is 8 days postop.  She was last seen here in the office on 04/14/2023, postop day 1.  She had bilateral JP drains removed.  Chaperone present on exam ***Bilateral NAC's are viable, bilateral breast incisions are intact. There is no erythema or cellulitic changes noted. No subcutaneous fluid collections noted with palpation.   A/P:  Recommend continuing with compressive garment 24/7 until 6 weeks post-op,  avoiding strenuous activity/heavy lifting until 6 weeks post-op  Recommend following up in *** All the patient's questions were answered to their content. Recommend calling with any questions or concerns.

## 2023-04-21 NOTE — Telephone Encounter (Signed)
Pt was late again for her visit this afternoon with matt, we were still able to see her, but she was late 2x in same day

## 2023-04-21 NOTE — Telephone Encounter (Signed)
Pt called and knew she was late for her apt, she was mad that she could not be seen when she called, she called 04-21-23 12:00pm asking if someone else could see her. Dr Ladona Ridgel and Georga Kaufmann had to leave for sx, I had to wait to hear from [facility]. I let the pt know that I had to wait until I could hear from [facility] before I can schedule anything, per Dr Ladona Ridgel. She stated "that she thinks I need empathy that she just had sx a few days ago" I told her I do have empathy but I can not put her on another providers sch that is not here to ask. Before I could get off the phone, Dr Ladona Ridgel and Susy Frizzle let me know Susy Frizzle can see her at 230 today. This is for documentation

## 2023-04-26 ENCOUNTER — Ambulatory Visit: Payer: Medicare Other | Admitting: Student

## 2023-04-26 ENCOUNTER — Encounter: Payer: Self-pay | Admitting: Student

## 2023-04-26 VITALS — BP 127/81 | HR 81 | Ht 63.0 in | Wt 233.6 lb

## 2023-04-26 DIAGNOSIS — Z9889 Other specified postprocedural states: Secondary | ICD-10-CM

## 2023-04-26 MED ORDER — SENNOSIDES 8.6 MG PO TABS: 1.0000 | ORAL_TABLET | Freq: Every day | ORAL | 0 refills | Status: DC

## 2023-04-26 NOTE — Progress Notes (Signed)
Patient is a 42 year old female here for follow-up after bilateral breast reduction with Dr. Ladona Ridgel on 04/13/2023. She is approximately 2 weeks postop. She presents to the clinic today for postoperative follow up.      Patient was last seen in the clinic on 04/21/2023.  At this visit, patient reported she was doing okay.  She stated that she was still having some pain near the incision and sometimes felt a little nauseated.  On exam, her breasts were soft and symmetric.  There was some swelling noted bilaterally.  No obvious fluid collections on exam.  NAC's were healthy bilaterally.  Incisions were clean dry and intact.  No signs of infection on exam.  Recommended she take Tylenol and ibuprofen for pain and continue compression at all times.  Today, patient reports she is doing okay.  She states that she is feeling a little better since her previous visit.  She does state though that she is still having some pain to the left breast.  She reports the nipple to the left NAC is very sensitive as well.  She denies any fevers or chills.  She states her nausea comes and goes.  She states she is no longer taking the oxycodone.  She states she is eating and drinking without any issue and that her appetite is getting better.  She does reports she has had 2 bowel movements since surgery.  She reports though she is still little bit constipated.  She reports she has been taking milk of magnesia.  She does state that she has been passing gas though.  Chaperone present on exam.  On exam, patient is sitting upright in no acute distress.  Breasts are soft and fairly symmetric  There is a little bit of ecchymosis noted to the left lateral breast.  No obvious fluid collections on exam.  No overlying erythema to either breast.  NAC's are healthy bilaterally.  Incisions are clean dry and intact.  There does appear to be may be the start of a wound to the right inferior T-zone.  No signs of infection on exam.  Discussed with  patient to continue her compression at all times and closely monitor her breasts.  Recommended she continue to alternate Tylenol and ibuprofen and continue her compression at all times.  Patient expressed understanding.  Discussed with patient she may start to apply Vaseline to her incisions daily.  Did discuss with her that she may have a small wound developing to the right inferior T-zone.  Patient expressed understanding.  Discussed with patient to continue to drink plenty of water and make sure she is up and ambulating.  Discussed with her that I can send her in some medication to help with her constipation.  Will go ahead and send in some Senokot.  Patient expressed understanding.  Discussed with patient to call us if she has any questions or concerns about anything.  Patient to follow-up next week.

## 2023-04-28 ENCOUNTER — Other Ambulatory Visit: Payer: Self-pay | Admitting: Medical Genetics

## 2023-04-29 ENCOUNTER — Other Ambulatory Visit: Payer: Self-pay

## 2023-04-29 ENCOUNTER — Inpatient Hospital Stay
Admission: EM | Admit: 2023-04-29 | Discharge: 2023-05-02 | DRG: 862 | Disposition: A | Discharge status: Other Acute Inpatient Hospital | Payer: Medicare Other | Attending: Hospitalist | Admitting: Hospitalist

## 2023-04-29 ENCOUNTER — Encounter (HOSPITAL_COMMUNITY): Payer: Self-pay

## 2023-04-29 ENCOUNTER — Encounter: Payer: Self-pay | Admitting: Family Medicine

## 2023-04-29 ENCOUNTER — Emergency Department: Payer: Medicare Other

## 2023-04-29 ENCOUNTER — Encounter: Payer: Self-pay | Admitting: Nurse Practitioner

## 2023-04-29 ENCOUNTER — Ambulatory Visit (INDEPENDENT_AMBULATORY_CARE_PROVIDER_SITE_OTHER): Payer: Medicare Other | Admitting: Nurse Practitioner

## 2023-04-29 VITALS — BP 138/78 | HR 88 | Temp 98.7°F | Resp 14 | Ht 63.0 in | Wt 235.5 lb

## 2023-04-29 DIAGNOSIS — Z7951 Long term (current) use of inhaled steroids: Secondary | ICD-10-CM | POA: Diagnosis not present

## 2023-04-29 DIAGNOSIS — I10 Essential (primary) hypertension: Secondary | ICD-10-CM | POA: Diagnosis present

## 2023-04-29 DIAGNOSIS — Y838 Other surgical procedures as the cause of abnormal reaction of the patient, or of later complication, without mention of misadventure at the time of the procedure: Secondary | ICD-10-CM | POA: Diagnosis present

## 2023-04-29 DIAGNOSIS — K5903 Drug induced constipation: Secondary | ICD-10-CM | POA: Diagnosis not present

## 2023-04-29 DIAGNOSIS — E66813 Obesity, class 3: Secondary | ICD-10-CM | POA: Diagnosis present

## 2023-04-29 DIAGNOSIS — E0500 Thyrotoxicosis with diffuse goiter without thyrotoxic crisis or storm: Secondary | ICD-10-CM | POA: Diagnosis present

## 2023-04-29 DIAGNOSIS — E039 Hypothyroidism, unspecified: Secondary | ICD-10-CM | POA: Diagnosis present

## 2023-04-29 DIAGNOSIS — N61 Mastitis without abscess: Secondary | ICD-10-CM

## 2023-04-29 DIAGNOSIS — M25552 Pain in left hip: Secondary | ICD-10-CM | POA: Diagnosis not present

## 2023-04-29 DIAGNOSIS — Z8249 Family history of ischemic heart disease and other diseases of the circulatory system: Secondary | ICD-10-CM

## 2023-04-29 DIAGNOSIS — Z7984 Long term (current) use of oral hypoglycemic drugs: Secondary | ICD-10-CM

## 2023-04-29 DIAGNOSIS — F419 Anxiety disorder, unspecified: Secondary | ICD-10-CM | POA: Diagnosis present

## 2023-04-29 DIAGNOSIS — T8140XA Infection following a procedure, unspecified, initial encounter: Secondary | ICD-10-CM | POA: Diagnosis present

## 2023-04-29 DIAGNOSIS — F418 Other specified anxiety disorders: Secondary | ICD-10-CM | POA: Diagnosis present

## 2023-04-29 DIAGNOSIS — A419 Sepsis, unspecified organism: Principal | ICD-10-CM | POA: Diagnosis present

## 2023-04-29 DIAGNOSIS — E871 Hypo-osmolality and hyponatremia: Secondary | ICD-10-CM | POA: Diagnosis not present

## 2023-04-29 DIAGNOSIS — J45909 Unspecified asthma, uncomplicated: Secondary | ICD-10-CM | POA: Diagnosis present

## 2023-04-29 DIAGNOSIS — Z79890 Hormone replacement therapy (postmenopausal): Secondary | ICD-10-CM | POA: Diagnosis not present

## 2023-04-29 DIAGNOSIS — F32A Depression, unspecified: Secondary | ICD-10-CM | POA: Diagnosis present

## 2023-04-29 DIAGNOSIS — Z8616 Personal history of COVID-19: Secondary | ICD-10-CM

## 2023-04-29 DIAGNOSIS — T8140XD Infection following a procedure, unspecified, subsequent encounter: Secondary | ICD-10-CM

## 2023-04-29 DIAGNOSIS — Z6841 Body Mass Index (BMI) 40.0 and over, adult: Secondary | ICD-10-CM

## 2023-04-29 DIAGNOSIS — Z91040 Latex allergy status: Secondary | ICD-10-CM | POA: Diagnosis not present

## 2023-04-29 DIAGNOSIS — T8149XA Infection following a procedure, other surgical site, initial encounter: Secondary | ICD-10-CM | POA: Diagnosis present

## 2023-04-29 DIAGNOSIS — N644 Mastodynia: Secondary | ICD-10-CM | POA: Diagnosis present

## 2023-04-29 DIAGNOSIS — Z79899 Other long term (current) drug therapy: Secondary | ICD-10-CM

## 2023-04-29 DIAGNOSIS — J4520 Mild intermittent asthma, uncomplicated: Secondary | ICD-10-CM | POA: Diagnosis not present

## 2023-04-29 DIAGNOSIS — T8142XA Infection following a procedure, deep incisional surgical site, initial encounter: Secondary | ICD-10-CM | POA: Diagnosis not present

## 2023-04-29 DIAGNOSIS — T402X5A Adverse effect of other opioids, initial encounter: Secondary | ICD-10-CM | POA: Diagnosis not present

## 2023-04-29 DIAGNOSIS — N611 Abscess of the breast and nipple: Secondary | ICD-10-CM | POA: Diagnosis not present

## 2023-04-29 LAB — CBC
HCT: 29.4 % — ABNORMAL LOW (ref 36.0–46.0)
Hemoglobin: 9.7 g/dL — ABNORMAL LOW (ref 12.0–15.0)
MCH: 27.4 pg (ref 26.0–34.0)
MCHC: 33 g/dL (ref 30.0–36.0)
MCV: 83.1 fL (ref 80.0–100.0)
Platelets: 474 10*3/uL — ABNORMAL HIGH (ref 150–400)
RBC: 3.54 MIL/uL — ABNORMAL LOW (ref 3.87–5.11)
RDW: 13.4 % (ref 11.5–15.5)
WBC: 15.2 10*3/uL — ABNORMAL HIGH (ref 4.0–10.5)
nRBC: 0 % (ref 0.0–0.2)

## 2023-04-29 LAB — BASIC METABOLIC PANEL
Anion gap: 11 (ref 5–15)
BUN: 11 mg/dL (ref 6–20)
CO2: 19 mmol/L — ABNORMAL LOW (ref 22–32)
Calcium: 8.7 mg/dL — ABNORMAL LOW (ref 8.9–10.3)
Chloride: 103 mmol/L (ref 98–111)
Creatinine, Ser: 0.82 mg/dL (ref 0.44–1.00)
GFR, Estimated: >60 mL/min (ref >60)
Glucose, Bld: 141 mg/dL — ABNORMAL HIGH (ref 70–99)
Potassium: 3.7 mmol/L (ref 3.5–5.1)
Sodium: 133 mmol/L — ABNORMAL LOW (ref 135–145)

## 2023-04-29 LAB — LACTIC ACID, PLASMA: Lactic Acid, Venous: 0.7 mmol/L (ref 0.5–1.9)

## 2023-04-29 MED ORDER — DULOXETINE HCL 30 MG PO CPEP
90.0000 mg | ORAL_CAPSULE | Freq: Every day | ORAL | Status: DC
Start: 2023-04-30 — End: 2023-05-02
  Administered 2023-04-30 – 2023-05-02 (×3): 90 mg via ORAL
  Filled 2023-04-29 (×3): qty 3

## 2023-04-29 MED ORDER — SULFAMETHOXAZOLE-TRIMETHOPRIM 800-160 MG PO TABS: 1.0000 | ORAL_TABLET | Freq: Two times a day (BID) | ORAL | 0 refills | Status: DC

## 2023-04-29 MED ORDER — IOHEXOL 300 MG/ML  SOLN
100.0000 mL | Freq: Once | INTRAMUSCULAR | Status: AC | PRN
  Administered 2023-04-29: 100 mL via INTRAVENOUS

## 2023-04-29 MED ORDER — ONDANSETRON HCL 4 MG/2ML IJ SOLN
4.0000 mg | Freq: Three times a day (TID) | INTRAMUSCULAR | Status: DC | PRN
  Administered 2023-04-30: 4 mg via INTRAVENOUS
  Filled 2023-04-29: qty 2

## 2023-04-29 MED ORDER — ONDANSETRON HCL 4 MG/2ML IJ SOLN
4.0000 mg | Freq: Once | INTRAMUSCULAR | Status: AC
  Administered 2023-04-29: 4 mg via INTRAVENOUS
  Filled 2023-04-29: qty 2

## 2023-04-29 MED ORDER — SENNA 8.6 MG PO TABS
1.0000 | ORAL_TABLET | Freq: Every day | ORAL | Status: DC
  Administered 2023-04-30 – 2023-05-02 (×3): 8.6 mg via ORAL
  Filled 2023-04-29 (×3): qty 1

## 2023-04-29 MED ORDER — SODIUM CHLORIDE 0.9 % IV SOLN
2.0000 g | INTRAVENOUS | Status: DC
  Administered 2023-04-30 – 2023-05-02 (×3): 2 g via INTRAVENOUS
  Filled 2023-04-29 (×4): qty 20

## 2023-04-29 MED ORDER — TRAZODONE HCL 50 MG PO TABS: 50.0000 mg | ORAL_TABLET | Freq: Every evening | ORAL | Status: DC | PRN

## 2023-04-29 MED ORDER — MORPHINE SULFATE (PF) 2 MG/ML IV SOLN
2.0000 mg | INTRAVENOUS | Status: DC | PRN
  Administered 2023-04-30: 2 mg via INTRAVENOUS
  Filled 2023-04-29: qty 1

## 2023-04-29 MED ORDER — BUSPIRONE HCL 5 MG PO TABS
5.0000 mg | ORAL_TABLET | Freq: Two times a day (BID) | ORAL | Status: DC
  Administered 2023-04-29 – 2023-05-02 (×6): 5 mg via ORAL
  Filled 2023-04-29 (×7): qty 1

## 2023-04-29 MED ORDER — ACETAMINOPHEN 325 MG PO TABS: 650.0000 mg | ORAL_TABLET | Freq: Four times a day (QID) | ORAL | Status: DC | PRN

## 2023-04-29 MED ORDER — HEPARIN SODIUM (PORCINE) 5000 UNIT/ML IJ SOLN
5000.0000 [IU] | Freq: Three times a day (TID) | INTRAMUSCULAR | Status: DC
  Administered 2023-04-29 – 2023-04-30 (×3): 5000 [IU] via SUBCUTANEOUS
  Filled 2023-04-29 (×4): qty 1

## 2023-04-29 MED ORDER — CEFAZOLIN SODIUM-DEXTROSE 2-4 GM/100ML-% IV SOLN
2.0000 g | Freq: Once | INTRAVENOUS | Status: AC
  Administered 2023-04-29: 2 g via INTRAVENOUS
  Filled 2023-04-29: qty 100

## 2023-04-29 MED ORDER — TOPIRAMATE 100 MG PO TABS
100.0000 mg | ORAL_TABLET | Freq: Every day | ORAL | Status: DC
  Administered 2023-04-29 – 2023-05-01 (×3): 100 mg via ORAL
  Filled 2023-04-29: qty 4
  Filled 2023-04-29 (×3): qty 1

## 2023-04-29 MED ORDER — ALBUTEROL SULFATE (2.5 MG/3ML) 0.083% IN NEBU: 2.5000 mg | INHALATION_SOLUTION | RESPIRATORY_TRACT | Status: DC | PRN

## 2023-04-29 MED ORDER — VANCOMYCIN HCL 2000 MG/400ML IV SOLN
2000.0000 mg | Freq: Once | INTRAVENOUS | Status: AC
  Administered 2023-04-29: 2000 mg via INTRAVENOUS
  Filled 2023-04-29: qty 400

## 2023-04-29 MED ORDER — HYDRALAZINE HCL 20 MG/ML IJ SOLN: 5.0000 mg | INTRAMUSCULAR | Status: DC | PRN

## 2023-04-29 MED ORDER — MONTELUKAST SODIUM 10 MG PO TABS
10.0000 mg | ORAL_TABLET | Freq: Every day | ORAL | Status: DC
  Administered 2023-04-29 – 2023-05-01 (×3): 10 mg via ORAL
  Filled 2023-04-29 (×4): qty 1

## 2023-04-29 MED ORDER — DM-GUAIFENESIN ER 30-600 MG PO TB12: 1.0000 | ORAL_TABLET | Freq: Two times a day (BID) | ORAL | Status: DC | PRN

## 2023-04-29 MED ORDER — SODIUM CHLORIDE 0.9 % IV BOLUS
1000.0000 mL | Freq: Once | INTRAVENOUS | Status: AC
  Administered 2023-04-29: 1000 mL via INTRAVENOUS

## 2023-04-29 MED ORDER — VANCOMYCIN HCL 10 G IV SOLR: 1750.0000 mg | INTRAVENOUS | Status: DC

## 2023-04-29 MED ORDER — HYDROXYZINE HCL 10 MG PO TABS: 10.0000 mg | ORAL_TABLET | Freq: Every day | ORAL | Status: DC | PRN

## 2023-04-29 MED ORDER — LEVOTHYROXINE SODIUM 100 MCG PO TABS
100.0000 ug | ORAL_TABLET | Freq: Every day | ORAL | Status: DC
  Administered 2023-04-30 – 2023-05-02 (×3): 100 ug via ORAL
  Filled 2023-04-29: qty 2
  Filled 2023-04-29 (×3): qty 1

## 2023-04-29 MED ORDER — TRAMADOL HCL 50 MG PO TABS: 50.0000 mg | ORAL_TABLET | Freq: Three times a day (TID) | ORAL | 0 refills | Status: DC | PRN

## 2023-04-29 MED ORDER — SODIUM CHLORIDE 0.9 % IV BOLUS: 2000.0000 mL | Freq: Once | INTRAVENOUS | Status: DC

## 2023-04-29 MED ORDER — PANTOPRAZOLE SODIUM 40 MG PO TBEC
40.0000 mg | DELAYED_RELEASE_TABLET | Freq: Every day | ORAL | Status: DC
  Administered 2023-04-30 – 2023-05-02 (×3): 40 mg via ORAL
  Filled 2023-04-29 (×3): qty 1

## 2023-04-29 MED ORDER — OXYCODONE-ACETAMINOPHEN 5-325 MG PO TABS
1.0000 | ORAL_TABLET | ORAL | Status: DC | PRN
  Administered 2023-04-30 (×2): 1 via ORAL
  Filled 2023-04-29 (×2): qty 1

## 2023-04-29 MED ORDER — HYDROMORPHONE HCL 1 MG/ML IJ SOLN
1.0000 mg | Freq: Once | INTRAMUSCULAR | Status: AC
Start: 2023-04-29 — End: 2023-04-29
  Administered 2023-04-29: 1 mg via INTRAVENOUS
  Filled 2023-04-29: qty 1

## 2023-04-29 MED ORDER — SODIUM CHLORIDE 0.9 % IV BOLUS: 1000.0000 mL | Freq: Once | INTRAVENOUS | Status: DC

## 2023-04-29 NOTE — ED Notes (Signed)
Pt and family states "the doctor said I can have something to drink for my dry mouth". Pt requested and received ice chips by this tech. Family given ice cream as well.

## 2023-04-29 NOTE — ED Notes (Signed)
Called [facility] @2009  spoke to Rep: Ruby to request consult/transfer to Plastic/General surgery due to Post Op Infection waiting on call back

## 2023-04-29 NOTE — ED Provider Notes (Signed)
[facility] Provider Note    Event Date/Time   First MD Initiated Contact with Patient 04/29/23 1739     (approximate)   History   Post-op Problem   HPI  Catherine Berg is a 42 y.o. female with a history of macromastia, obesity, chronic pain, asthma, hypothyroidism, GERD, hypertension, and IBS who presents with worsening left-sided breast pain, swelling, and redness over the last several days.  The patient had a bilateral breast reduction on 12/10 and was doing well initially.  Over the last week she has developed increasing pain to the left breast subsequently with swelling and redness to the skin.  The patient was started on antibiotics and tramadol today, but the pain is worsening.  She had a fever to 101 earlier today as well.  She denies any significant drainage or bleeding from the incision sites themselves.  She denies any vomiting or diarrhea.  Reviewed the past medical records.  The patient had a bilateral breast reduction by Dr. Ladona Ridgel at [facility] on 12/10 which was uncomplicated.  Her last follow-up was on 12/23.  At that time she had ecchymosis noted to the left lateral breast with no obvious fluid collections or erythema, and the incisions were intact.  She was seen by family medicine earlier today, noted to have left breast pain with erythema and warmth and was started on Bactrim and tramadol.   Physical Exam   Triage Vital Signs: ED Triage Vitals  Encounter Vitals Group     BP 04/29/23 1513 123/88     Systolic BP Percentile --      Diastolic BP Percentile --      Pulse Rate 04/29/23 1511 (!) 133     Resp 04/29/23 1511 16     Temp 04/29/23 1511 98.6 F (37 C)     Temp src --      SpO2 04/29/23 1513 99 %     Weight 04/29/23 1741 235 lb 7.2 oz (106.8 kg)     Height 04/29/23 1741 5\' 3"  (1.6 m)     Head Circumference --      Peak Flow --      Pain Score --      Pain Loc --      Pain Education --      Exclude from Growth Chart --      Most recent vital signs: Vitals:   04/29/23 1513 04/29/23 2106  BP: 123/88 120/76  Pulse:  99  Resp:  18  Temp:  98 F (36.7 C)  SpO2: 99% 100%     General: Awake, no distress.  CV:  Good peripheral perfusion.  Resp:  Normal effort.  Abd:  No distention.  Other:  Left breast swollen, tender, and with erythema and induration inferiorly adjacent to the incision site as well as in the left lateral superior breast.  Incision itself appears clean, dry, intact with no drainage.  Right breast with no significant swelling or tenderness.   ED Results / Procedures / Treatments   Labs (all labs ordered are listed, but only abnormal results are displayed) Labs Reviewed  CBC - Abnormal; Notable for the following components:      Result Value   WBC 15.2 (*)    RBC 3.54 (*)    Hemoglobin 9.7 (*)    HCT 29.4 (*)    Platelets 474 (*)    All other components within normal limits  BASIC METABOLIC PANEL - Abnormal; Notable for the following components:  Sodium 133 (*)    CO2 19 (*)    Glucose, Bld 141 (*)    Calcium 8.7 (*)    All other components within normal limits  CULTURE, BLOOD (ROUTINE X 2)  CULTURE, BLOOD (ROUTINE X 2)  LACTIC ACID, PLASMA  C-REACTIVE PROTEIN  SEDIMENTATION RATE  HIV ANTIBODY (ROUTINE TESTING W REFLEX)  BASIC METABOLIC PANEL  CBC     EKG     RADIOLOGY  CT chest: I independently viewed and interpreted the images; there is no enhancing abscess.  Radiology report indicates the following:  IMPRESSION:  Loosely organized fluid in both breasts, greater on the left, likely  represents postoperative seromas/hematomas. Superimposed infection  is not excluded by imaging.     PROCEDURES:  Critical Care performed: No  Procedures   MEDICATIONS ORDERED IN ED: Medications  morphine (PF) 2 MG/ML injection 2 mg (has no administration in time range)  oxyCODONE-acetaminophen (PERCOCET/ROXICET) 5-325 MG per tablet 1 tablet (has no administration in  time range)  acetaminophen (TYLENOL) tablet 650 mg (has no administration in time range)  ondansetron (ZOFRAN) injection 4 mg (has no administration in time range)  hydrALAZINE (APRESOLINE) injection 5 mg (has no administration in time range)  albuterol (PROVENTIL) (2.5 MG/3ML) 0.083% nebulizer solution 2.5 mg (has no administration in time range)  dextromethorphan-guaiFENesin (MUCINEX DM) 30-600 MG per 12 hr tablet 1 tablet (has no administration in time range)  sodium chloride 0.9 % bolus 1,000 mL (has no administration in time range)  heparin injection 5,000 Units (has no administration in time range)  cefTRIAXone (ROCEPHIN) 2 g in sodium chloride 0.9 % 100 mL IVPB (has no administration in time range)  busPIRone (BUSPAR) tablet 5 mg (has no administration in time range)  DULoxetine (CYMBALTA) DR capsule 90 mg (has no administration in time range)  hydrOXYzine (ATARAX) tablet 10 mg (has no administration in time range)  traZODone (DESYREL) tablet 50 mg (has no administration in time range)  levothyroxine (SYNTHROID) tablet 100 mcg (has no administration in time range)  pantoprazole (PROTONIX) EC tablet 40 mg (has no administration in time range)  senna (SENOKOT) tablet 8.6 mg (has no administration in time range)  topiramate (TOPAMAX) tablet 100 mg (has no administration in time range)  montelukast (SINGULAIR) tablet 10 mg (has no administration in time range)  vancomycin (VANCOREADY) IVPB 2000 mg/400 mL (has no administration in time range)  vancomycin (VANCOCIN) 1,750 mg in sodium chloride 0.9 % 500 mL IVPB (has no administration in time range)  HYDROmorphone (DILAUDID) injection 1 mg (1 mg Intravenous Given 04/29/23 1845)  iohexol (OMNIPAQUE) 300 MG/ML solution 100 mL (100 mLs Intravenous Contrast Given 04/29/23 1849)  ceFAZolin (ANCEF) IVPB 2g/100 mL premix (0 g Intravenous Stopped 04/29/23 2207)  ondansetron (ZOFRAN) injection 4 mg (4 mg Intravenous Given 04/29/23 2121)      IMPRESSION / MDM / ASSESSMENT AND PLAN / ED COURSE  I reviewed the triage vital signs and the nursing notes.  42 year old female with PMH as noted above and status post bilateral breast reduction 16 days ago presents with increasing pain, swelling, and redness to the left breast.  On exam there is erythema, induration, and tenderness.  The patient was tachycardic at triage to the 130s.  On my, heart rate is 100.  Other vital signs are normal although the patient had a fever earlier at home.  Initial labs are significant for WBC count of 15.  BMP shows normal chemistry.  Differential diagnosis includes, but is not limited to, cellulitis,  seroma, hematoma, abscess.  We will obtain lactate, blood cultures, and CT for further evaluation in addition to giving IV analgesia.  I anticipate the patient likely will need admission for IV antibiotics.  Patient's presentation is most consistent with acute presentation with potential threat to life or bodily function.  The patient is on the cardiac monitor to evaluate for evidence of arrhythmia and/or significant heart rate changes.   ----------------------------------------- 8:40 PM on 04/29/2023 -----------------------------------------  CT shows loosely organized fluid in the wrist but no discrete abscess.  Lactate is normal.  I have ordered IV Ancef.  The patient will need admission for IV antibiotics.  Since she was operated on by plastic surgery at [facility] I have reached out to [facility] to discuss with the on-call surgeon about possible transfer.  ----------------------------------------- 9:32 PM on 04/29/2023 -----------------------------------------  I consulted and discussed the case with Dr. Ulice Bold from plastic surgery at [facility].  I reviewed the clinical presentation and imaging findings with her.  She advises that there is no indication for acute surgical intervention and states that therefore there is no specific indication  that the patient would need to be transferred to [facility], since she likely will only need IV antibiotics.  But she stated that she would be happy for the patient to be transferred if the patient wants this.  The patient herself would strongly prefer to be admitted to [facility], based on discussion with Dr. Ulice Bold, we will keep her here and admit her to the hospitalist service at this time.  I then consulted Dr. Clyde Lundborg from the hospital service; based on our discussion he agrees to evaluate the patient for admission.   FINAL CLINICAL IMPRESSION(S) / ED DIAGNOSES   Final diagnoses:  Cellulitis of left breast  Postoperative infection, unspecified type, subsequent encounter     Rx / DC Orders   ED Discharge Orders     None        Note:  This document was prepared using Dragon voice recognition software and may include unintentional dictation errors.    Dionne Bucy, MD 04/29/23 [phone]

## 2023-04-29 NOTE — Progress Notes (Signed)
BP 138/78 (BP Location: Right Arm, Patient Position: Sitting, Cuff Size: Large)   Pulse 88   Temp 98.7 F (37.1 C) (Oral)   Resp 14   Ht 5\' 3"  (1.6 m)   Wt 235 lb 8 oz (106.8 kg)   SpO2 99%   BMI 41.72 kg/m    Subjective:    Patient ID: Catherine Berg, female    DOB: 1980/06/23, 42 y.o.   MRN: [id number]  HPI: Catherine Berg is a 42 y.o. female  Chief Complaint  Patient presents with   Breast Pain    Left 10/10   Hip Pain    7/10 localized/non-radiating    Discussed the use of AI scribe software for clinical note transcription with the patient, who gave verbal consent to proceed.  History of Present Illness   The patient, who underwent breast reduction surgery two weeks ago, presents with severe pain in the left breast. The pain is localized and has been persistent despite taking over-the-counter pain medications such as Motrin and Tylenol. The patient reports that the affected breast feels hot to the touch and is visibly red on one side. The right breast, in contrast, is not causing any discomfort or showing any signs of inflammation.  In addition to the breast pain, the patient also reports a persistent pain in the hip area that has been present for approximately two weeks. The pain is localized and increases with movement, particularly when walking or stepping down stairs. Resting seems to alleviate the discomfort. The patient denies any trauma or loss of sensation in the area.       03/17/2023    1:12 PM 02/25/2023    3:04 PM 12/24/2022   10:10 AM  Depression screen PHQ 2/9  Decreased Interest  2 0  Down, Depressed, Hopeless  2 0  PHQ - 2 Score  4 0  Altered sleeping  2   Tired, decreased energy  2   Change in appetite  1   Feeling bad or failure about yourself   0   Trouble concentrating  0   Moving slowly or fidgety/restless  0   Suicidal thoughts  0   PHQ-9 Score  9   Difficult doing work/chores  Somewhat difficult      Information is confidential and  restricted. Go to Review Flowsheets to unlock data.    Relevant past medical, surgical, family and social history reviewed and updated as indicated. Interim medical history since our last visit reviewed. Allergies and medications reviewed and updated.  Review of Systems  Ten systems reviewed and is negative except as mentioned in HPI     Objective:    BP 138/78 (BP Location: Right Arm, Patient Position: Sitting, Cuff Size: Large)   Pulse 88   Temp 98.7 F (37.1 C) (Oral)   Resp 14   Ht 5\' 3"  (1.6 m)   Wt 235 lb 8 oz (106.8 kg)   SpO2 99%   BMI 41.72 kg/m    Wt Readings from Last 3 Encounters:  04/29/23 235 lb 8 oz (106.8 kg)  04/26/23 233 lb 9.6 oz (106 kg)  04/06/23 237 lb (107.5 kg)    Physical Exam  Constitutional: Patient appears well-developed and well-nourished. Obese  No distress.  HEENT: head atraumatic, normocephalic, pupils equal and reactive to light, neck supple Cardiovascular: Normal rate, regular rhythm and normal heart sounds.  No murmur heard. No BLE edema. Pulmonary/Chest: Effort normal and breath sounds normal. No respiratory distress. Breasts: breasts appear  normal, no suspicious masses, no skin or nipple changes or axillary nodes, left breast hot, firm and tender to the touch, right breast no signs of infection.  Abdominal: Soft.  There is no tenderness. Psychiatric: Patient has a normal mood and affect. behavior is normal. Judgment and thought content normal.  Results for orders placed or performed during the hospital encounter of 04/13/23  Pregnancy, urine POC   Collection Time: 04/13/23 10:54 AM  Result Value Ref Range   Preg Test, Ur NEGATIVE NEGATIVE  Surgical pathology   Collection Time: 04/13/23  1:01 PM  Result Value Ref Range   SURGICAL PATHOLOGY      SURGICAL PATHOLOGY CASE: [id number] PATIENT: Catherine Berg Surgical Pathology Report     Clinical History: Macromastia (crm)     FINAL MICROSCOPIC DIAGNOSIS:  A. BREAST, RIGHT,  REDUCTION MAMMOPLASTY: - Benign breast parenchyma with no specific histopathologic changes - Negative for carcinoma  B. BREAST, LEFT, REDUCTION MAMMOPLASTY: - Benign breast parenchyma with no specific histopathologic changes - Negative for carcinoma      GROSS DESCRIPTION:  A: Specimen: Right breast tissue, received fresh Weight: 1674 g Size in aggregate: 29 x 19 x 5.5 cm including attached and separate portions of skin. Cut Surface: The cut surfaces consist of soft yellow adipose tissue and white fibrous tissue.  There are no discrete masses. Block Summary: 2 blocks submitted  B: Specimen: Left breast tissue, received fresh Weight: 1568 g Size in aggregate: 28 x 27 x 5.5 cm including attached and separate portions of skin. Cut Surface: The cut surfaces consist of sof t yellow adipose tissue and white fibrous tissue.  There are no discrete masses. Block Summary: 2 blocks submitted (GRP 06/14/2022)    Final Diagnosis performed by Holley Bouche, MD.   Electronically signed 04/15/2023 Technical component performed at [facility]. [facility], [address], [place], [place] [address].  Professional component performed at [facility], [address], [place], [place] [address].  Immunohistochemistry Technical component (if applicable) was performed at [facility]. [address], [address], [place], [place] [address].   IMMUNOHISTOCHEMISTRY DISCLAIMER (if applicable): Some of these immunohistochemical stains may have been developed and the performance characteristics determine by [facility]. Some may not have been cleared or approved by the U.S. Food and Drug Administration. The FDA has determined that such clearance or approval is not necessary. This test is used for clinical  purposes. It should not be regarded as investigational or for research. This laboratory is certified under the Clinical Laboratory  Improvement Amendments of 1988 (CLIA-88) as qualified to perform high complexity clinical laboratory testing.  The controls stained appropriately.   IHC stains are performed on formalin fixed, paraffin embedded tissue using a 3,3"diaminobenzidine (DAB) chromogen and Leica Bond Autostainer System. The staining intensity of the nucleus is score manually and is reported as the percentage of tumor cell nuclei demonstrating specific nuclear staining. The specimens are fixed in 10% Neutral Formalin for at least 6 hours and up to 72hrs. These tests are validated on decalcified tissue. Results should be interpreted with caution given the possibility of false negative results on decalcified specimens. Antibody Clones are as follows ER-clone 61F, PR-clone 16, Ki67- clone MM1. Some of these immunohistochemical stains may have been developed and the perfo rmance characteristics determined by [facility].        Assessment & Plan:   Problem List Items Addressed This Visit   None Visit Diagnoses  Cellulitis of left breast    -  Primary   Relevant Medications   sulfamethoxazole-trimethoprim (BACTRIM DS) 800-160 MG tablet   traMADol (ULTRAM) 50 MG tablet     Pain of left hip            Assessment and Plan    Postoperative Breast Reduction Complication Left breast pain, erythema, and warmth 2 weeks post breast reduction surgery. No drainage noted. Right breast appears to be healing well. -Start antibiotics. -Continue with pain management using Tramadol. -Apply warm compresses to the left breast at least 4 times daily. -Follow-up appointment with surgeon on Monday.  Hip Pain New onset of hip pain for the past 2 weeks, exacerbated by movement and relieved by rest. No trauma or loss of sensation reported. -Continue pain management with Tramadol. -Encourage gentle hip exercises. -Report any numbness or tingling immediately to the emergency room. -Update on hip pain status on  Monday.        Follow up plan: Return if symptoms worsen or fail to improve.

## 2023-04-29 NOTE — ED Triage Notes (Signed)
Pt to ED for left breast pain, fever, nausea worsening today. Reports breast reduction 2 weeks ago. Went to see NP today and was prescribed pain meds and antibiotics.  Reports here for continued pain, took tramadol PTA

## 2023-04-29 NOTE — H&P (Signed)
History and Physical    Catherine Berg NWG:[id number] DOB: 11/21/1980 DOA: 04/29/2023  Referring MD/NP/PA:   PCP: [name], MD   Patient coming from:  The patient is coming from home.     Chief Complaint: left breast pain  HPI: Catherine Berg is a 42 y.o. female with medical history significant of macromastia, HTN, asthma, Graves' disease --> hypothyroidism, depression with anxiety, obesity, who presents with left breast pain.  The patient had bilateral breast reduction by Dr. Ladona Ridgel at [facility] on 12/10 which was uncomplicated. Over the last week she has developed pain, redness and swelling to left breast.  The pain is constant, sharp, 6 out of 10 in severity.  Associated with fever and chills.  She denies any significant drainage or bleeding from the incision sites themselves.She had temperature 101 at home today. The patient was started on Bactrim and tramadol today, but the pain is worsening.  Patient does not have shortness of breath of breath, cough, nausea, vomiting, diarrhea or abdominal pain.  No symptoms of UTI.   Data reviewed independently and ED Course: pt was found to have WBC 15.2, GFR> 60, lactic acid 0.7, temperature normal in ED, blood pressure 120/76, heart rate 133 --> 99, RR 18, oxygen saturation 100% on room air.  Patient is admitted to MedSurg bed as inpatient.  ED physician discussed with Dr. Ulice Bold of plastic surgery in [facility].   CT-chest:  Loosely organized fluid in both breasts, greater on the left, likely represents postoperative seromas/hematomas. Superimposed infection is not excluded by imaging.   EKG:  Not done in ED.      Review of Systems:   General: has subjective fevers, chills, no body weight gain. has fatigue HEENT: no blurry vision, hearing changes or sore throat Respiratory: no dyspnea, coughing, wheezing CV: no chest pain, no palpitations GI: no nausea, vomiting, abdominal pain, diarrhea, constipation GU: no dysuria,  burning on urination, increased urinary frequency, hematuria  Ext: no leg edema Neuro: no unilateral weakness, numbness, or tingling, no vision change or hearing loss Skin: has redness and pain in left breast MSK: No muscle spasm, no deformity, no limitation of range of movement in spin Heme: No easy bruising.  Travel history: No recent long distant travel.   Allergy:  Allergies  Allergen Reactions   Latex Hives    (Balloons, condoms, underwear elastic, gloves)   Shellfish Allergy Hives    Past Medical History:  Diagnosis Date   Abnormal thyroid blood test    Anemia    Anxiety    Asthma    Complication of anesthesia    itching after c sections   COVID-19 01/2020   Depression    Elevated serum glutamic pyruvic transaminase (SGPT) level    Graves disease    History of abnormal cervical Pap smear    History of cervical polypectomy    Hives 09/02/2015   Hypertension    Hypothyroidism    IFG (impaired fasting glucose)    Incidental lung nodule, > 3mm and < 8mm 03/31/2017   4 mm RLL lung nodule on chest CT Mar 31, 2017   Low serum vitamin D    Obesity    PONV (postoperative nausea and vomiting)    after c section had vomit   Pregnancy induced hypertension    with 1st pregnancy, normal pressure with 2nd   Reflux    Sprain of right great toe 01/21/2023   Thyroid disease    Vitamin B12 deficiency  Vitamin D deficiency disease    Wears dentures    full upper    Past Surgical History:  Procedure Laterality Date   BREAST REDUCTION SURGERY Bilateral 04/13/2023   Procedure: MAMMARY REDUCTION  (BREAST);  Surgeon: [name], MD;  Location: [facility];  Service: Plastics;  Laterality: Bilateral;   CERVICAL POLYPECTOMY     CESAREAN SECTION     x 2   COLONOSCOPY WITH PROPOFOL N/A 11/14/2018   Procedure: COLONOSCOPY WITH PROPOFOL;  Surgeon: Pasty Spillers, MD;  Location: [facility];  Service: Endoscopy;  Laterality: N/A;    ESOPHAGOGASTRODUODENOSCOPY (EGD) WITH PROPOFOL N/A 11/14/2018   Procedure: ESOPHAGOGASTRODUODENOSCOPY (EGD) WITH PROPOFOL;  Surgeon: Pasty Spillers, MD;  Location: [facility];  Service: Endoscopy;  Laterality: N/A;  Latex allergy   GIVENS CAPSULE STUDY N/A 12/27/2018   Procedure: GIVENS CAPSULE STUDY;  Surgeon: Pasty Spillers, MD;  Location: [facility];  Service: Endoscopy;  Laterality: N/A;    Social History:  reports that she has never smoked. She has never used smokeless tobacco. She reports that she does not drink alcohol and does not use drugs.  Family History:  Family History  Problem Relation Age of Onset   Heart attack Mother 73   Hypertension Mother    Asthma Mother    Cancer Mother        laryngeal   Diabetes Mother        pre diabetic   Heart disease Mother    Mental illness Mother    Alcohol abuse Mother    Drug abuse Mother    Depression Mother    Anxiety disorder Mother    Bipolar disorder Mother    Learning disabilities Brother    ADD / ADHD Brother    Asthma Son    Hyperlipidemia Brother    Alcohol abuse Father    Heart disease Maternal Grandmother        heart attack, pacemaker   Heart attack Maternal Grandmother    Hypertension Maternal Grandmother    Hypertension Maternal Grandfather    Heart disease Paternal Grandmother        couple of major open heart surgeries, leaking valves   Hypertension Paternal Grandmother    Hypertension Paternal Grandfather    Breast cancer Neg Hx      Prior to Admission medications   Medication Sig Start Date End Date Taking? Authorizing Provider  amLODipine (NORVASC) 5 MG tablet Take 1 tablet (5 mg total) by mouth daily. 12/14/22   [name], MD  Azelastine HCl 137 MCG/SPRAY SOLN SPRAY TWO SPRAYS IN EACH NOSTRIL TWICE DAILY. 05/19/21   [name], MD  busPIRone (BUSPAR) 5 MG tablet Take 5 mg by mouth 2 (two) times daily. 04/13/23   [provider]  Cyanocobalamin (B-12) 1000 MCG  SUBL Place 1 tablet under the tongue daily. 11/17/21   [name], MD  dicyclomine (BENTYL) 10 MG capsule Take 1 capsule (10 mg total) by mouth 4 (four) times daily -  before meals and at bedtime. 10/19/22 01/17/23  Celso Amy, PA-C  DULoxetine (CYMBALTA) 60 MG capsule Take 1 capsule (60 mg total) by mouth daily. Total of 90 mg daily. Take along with 30 mg cap 12/01/22 04/30/23  [name], MD  etonogestrel (NEXPLANON) 68 MG IMPL implant Inject 68 mg into the skin once.    [provider]  hydrOXYzine (ATARAX) 10 MG tablet Take 1 tablet (10 mg total) by mouth daily as needed for anxiety. 02/26/23  06/26/23  [name], MD  levothyroxine (SYNTHROID) 88 MCG tablet Take 88 mcg by mouth daily before breakfast.    Sherlon Handing, MD  linaclotide [facility]) 72 MCG capsule Take 1 capsule (72 mcg total) by mouth daily before breakfast. 03/22/23   Berniece Salines, FNP  metFORMIN (GLUCOPHAGE) 500 MG tablet Take 1 tablet (500 mg total) by mouth daily with breakfast. Patient taking differently: Take 500 mg by mouth daily with breakfast. Takes for long covid neuropathy 12/24/22   Raulkar, Drema Pry, MD  mometasone-formoterol (DULERA) 200-5 MCG/ACT AERO Inhale 2 puffs into the lungs in the morning and at bedtime. 05/12/22   Parrett, Virgel Bouquet, NP  montelukast (SINGULAIR) 10 MG tablet Take 1 tablet (10 mg total) by mouth at bedtime. 05/12/22   Parrett, Virgel Bouquet, NP  omeprazole (PRILOSEC) 20 MG capsule Take 1 capsule (20 mg total) by mouth 2 (two) times daily before a meal. 02/25/23   Berniece Salines, FNP  ondansetron (ZOFRAN-ODT) 4 MG disintegrating tablet Take 1 tablet (4 mg total) by mouth every 8 (eight) hours as needed for nausea or vomiting. 03/18/23   Lorelee New, PA-C  senna (SENOKOT) 8.6 MG tablet Take 1 tablet (8.6 mg total) by mouth daily for 5 days. 04/26/23 05/01/23  Laurena Spies, PA-C  sulfamethoxazole-trimethoprim (BACTRIM DS) 800-160 MG tablet Take 1 tablet by mouth 2 (two)  times daily for 7 days. 04/29/23 05/06/23  Berniece Salines, FNP  topiramate (TOPAMAX) 100 MG tablet Take 1 tablet (100 mg total) by mouth at bedtime. 12/24/22   Raulkar, Drema Pry, MD  traMADol (ULTRAM) 50 MG tablet Take 1 tablet (50 mg total) by mouth every 8 (eight) hours as needed for up to 7 days. 04/29/23 05/06/23  Berniece Salines, FNP  traZODone (DESYREL) 50 MG tablet Take 1 tablet (50 mg total) by mouth at bedtime as needed for sleep. 02/26/23 06/26/23  [name], MD  VENTOLIN HFA 108 (90 Base) MCG/ACT inhaler INHALE 2 PUFFS BY MOUTH EVERY 6 HOURS AS NEEDED FOR WHEEZING OR SHORTNESS OF BREATH 07/30/22   Erin Fulling, MD  Vitamin D, Ergocalciferol, (DRISDOL) 1.25 MG (50000 UNIT) CAPS capsule Take 1 capsule (50,000 Units total) by mouth every 7 (seven) days. 12/14/22   [name], MD    Physical Exam: Vitals:   04/29/23 1511 04/29/23 1513 04/29/23 1741 04/29/23 2106  BP:  123/88  120/76  Pulse: (!) 133   99  Resp: 16   18  Temp: 98.6 F (37 C)   98 F (36.7 C)  TempSrc:    Oral  SpO2:  99%  100%  Weight:   106.8 kg   Height:   5\' 3"  (1.6 m)    General: Not in acute distress HEENT:       Eyes: PERRL, EOMI, no jaundice       ENT: No discharge from the ears and nose, no pharynx injection, no tonsillar enlargement.        Neck: No JVD, no bruit, no mass felt. Heme: No neck lymph node enlargement. Cardiac: S1/S2, RRR, No murmurs, No gallops or rubs. Respiratory: No rales, wheezing, rhonchi or rubs. GI: Soft, nondistended, nontender, no rebound pain, no organomegaly, BS present. GU: No hematuria Ext: No pitting leg edema bilaterally. 1+DP/PT pulse bilaterally. Musculoskeletal: No joint deformities, No joint redness or warmth, no limitation of ROM in spin. Skin: Has erythema, tenderness, swelling, induration, warmth in left breast.  Has minimal amount of serosanguineous drainage from surgical site.  Neuro: Alert, oriented X3, cranial nerves II-XII grossly intact, moves all  extremities normally. Psych: Patient is not psychotic, no suicidal or hemocidal ideation.  Labs on Admission: I have personally reviewed following labs and imaging studies  CBC: Recent Labs  Lab 04/29/23 1516  WBC 15.2*  HGB 9.7*  HCT 29.4*  MCV 83.1  PLT 474*   Basic Metabolic Panel: Recent Labs  Lab 04/29/23 1516  NA 133*  K 3.7  CL 103  CO2 19*  GLUCOSE 141*  BUN 11  CREATININE 0.82  CALCIUM 8.7*   GFR: Estimated Creatinine Clearance: 103.6 mL/min (by C-G formula based on SCr of 0.82 mg/dL). Liver Function Tests: No results for input(s): "AST", "ALT", "ALKPHOS", "BILITOT", "PROT", "ALBUMIN" in the last 168 hours. No results for input(s): "LIPASE", "AMYLASE" in the last 168 hours. No results for input(s): "AMMONIA" in the last 168 hours. Coagulation Profile: No results for input(s): "INR", "PROTIME" in the last 168 hours. Cardiac Enzymes: No results for input(s): "CKTOTAL", "CKMB", "CKMBINDEX", "TROPONINI" in the last 168 hours. BNP (last 3 results) No results for input(s): "PROBNP" in the last 8760 hours. HbA1C: No results for input(s): "HGBA1C" in the last 72 hours. CBG: No results for input(s): "GLUCAP" in the last 168 hours. Lipid Profile: No results for input(s): "CHOL", "HDL", "LDLCALC", "TRIG", "CHOLHDL", "LDLDIRECT" in the last 72 hours. Thyroid Function Tests: No results for input(s): "TSH", "T4TOTAL", "FREET4", "T3FREE", "THYROIDAB" in the last 72 hours. Anemia Panel: No results for input(s): "VITAMINB12", "FOLATE", "FERRITIN", "TIBC", "IRON", "RETICCTPCT" in the last 72 hours. Urine analysis:    Component Value Date/Time   COLORURINE DARK YELLOW 03/30/2017 1642   APPEARANCEUR CLEAR 03/30/2017 1642   APPEARANCEUR Cloudy (A) 07/21/2016 1625   LABSPEC 1.014 03/30/2017 1642   LABSPEC 1.015 08/13/2012 2249   PHURINE 6.0 03/30/2017 1642   GLUCOSEU NEGATIVE 03/30/2017 1642   GLUCOSEU Negative 08/13/2012 2249   HGBUR 2+ (A) 03/30/2017 1642    BILIRUBINUR Negative 12/14/2022 1029   BILIRUBINUR Negative 07/21/2016 1625   BILIRUBINUR Negative 08/13/2012 2249   KETONESUR NEGATIVE 03/30/2017 1642   PROTEINUR Negative 12/14/2022 1029   PROTEINUR NEGATIVE 03/30/2017 1642   UROBILINOGEN 0.2 12/14/2022 1029   NITRITE Negative 12/14/2022 1029   NITRITE NEGATIVE 09/22/2016 1626   LEUKOCYTESUR Negative 12/14/2022 1029   LEUKOCYTESUR Negative 07/21/2016 1625   LEUKOCYTESUR Negative 08/13/2012 2249   Sepsis Labs: @LABRCNTIP (procalcitonin:4,lacticidven:4) )No results found for this or any previous visit (from the past 240 hours).   Radiological Exams on Admission: CT Chest W Contrast Result Date: 04/29/2023 CLINICAL DATA:  Soft tissue infection suspected, left breast postop from reduction 2 weeks ago with increased swelling, induration, erythema eval for fluid collection versus cellulitis EXAM: CT CHEST WITH CONTRAST TECHNIQUE: Multidetector CT imaging of the chest was performed during intravenous contrast administration. RADIATION DOSE REDUCTION: This exam was performed according to the departmental dose-optimization program which includes automated exposure control, adjustment of the mA and/or kV according to patient size and/or use of iterative reconstruction technique. CONTRAST:  OMNIPAQUE IOHEXOL 300 MG/ML  SOLN COMPARISON:  CT 09/20/2020 FINDINGS: Cardiovascular: Normal heart size. No pericardial effusion. Normal caliber thoracic aorta. Mediastinum/Nodes: Trachea and esophagus are unremarkable. Lungs/Pleura: Mild bibasilar atelectasis. The lungs are otherwise clear. No pleural effusion or pneumothorax. Upper Abdomen: No acute abnormality. Musculoskeletal: Multiple loosely organized fluid collections in the left breast. The largest is in the lateral left breast and measures 3.6 x 3.3 x 11.5 cm (series 2/image 70 and 5/61). A tract of fluid from  the fluid collection extends into the subareolar region and into the medial breast where there  is a smaller 2.6 cm collection. Within the lateral right breast there is a 3.0 x 1.7 x 3.0 cm fluid collection. Fluid extends medially to the subareolar space. There is adjacent fat stranding about the fluid collections in both breasts. No acute fracture. IMPRESSION: Loosely organized fluid in both breasts, greater on the left, likely represents postoperative seromas/hematomas. Superimposed infection is not excluded by imaging. Electronically Signed   By: Minerva Fester M.D.   On: 04/29/2023 19:23      Assessment/Plan Principal Problem:   Cellulitis of left breast Active Problems:   Sepsis (HCC)   HTN (hypertension)   Asthma   Hypothyroidism   Depression with anxiety   Morbid obesity (HCC)   Assessment and Plan:   Sepsis due to cellulitis of left breast: Patient meets criteria for sepsis with WBC 15.2, heart rate up to 133.  Lactic acid normal 0.7. CT shows loosely organized fluid in the wrist but no discrete abscess. ED physician discussed with Dr. Ulice Bold of plastic surgery in [facility]. Per EDP, at this moment, will treat the patient with antibiotics, no surgical treatment needed.  - will admit to med-surg bed as inpatient - Empiric antimicrobial treatment with vancomycin and Rocephin (patient received 1 dose of Ancef in ED) - PRN Zofran for nausea, morphine and Percocet for pain - Blood cultures x 2  - ESR and CRP - IVF: 1.0 L of NS bolus  HTN (hypertension) -Hold amlodipine since patient at risk of developing hypotension due to sepsis -IV hydralazine as needed  Asthma: Stable -As needed albuterol and Mucinex  Hypothyroidism -Synthroid  Depression with anxiety -Currently new home medications  Morbid obesity (HCC): Body weight 106.8 kg, BMI 41.71 -Encourage losing weight -Exercise and healthy diet      DVT ppx: SQ Heparin    Code Status: Full code    Family Communication:  Yes, patient's daughter and friend   at bed side.    Disposition Plan:   Anticipate discharge back to previous environment  Consults called: ED physician discussed with Dr. Ulice Bold of plastic surgery in [facility]  Admission status and Level of care: Med-Surg:  as inpt     Dispo: The patient is from: Home              Anticipated d/c is to: Home              Anticipated d/c date is: 2 days              Patient currently is not medically stable to d/c.    Severity of Illness:  The appropriate patient status for this patient is INPATIENT. Inpatient status is judged to be reasonable and necessary in order to provide the required intensity of service to ensure the patient's safety. The patient's presenting symptoms, physical exam findings, and initial radiographic and laboratory data in the context of their chronic comorbidities is felt to place them at high risk for further clinical deterioration. Furthermore, it is not anticipated that the patient will be medically stable for discharge from the hospital within 2 midnights of admission.   * I certify that at the point of admission it is my clinical judgment that the patient will require inpatient hospital care spanning beyond 2 midnights from the point of admission due to high intensity of service, high risk for further deterioration and high frequency of surveillance required.*  Date of Service 04/29/2023    Lorretta Harp [facility]   If 7PM-7AM, please contact night-coverage [email or web address] 04/29/2023, 10:10 PM

## 2023-04-29 NOTE — Progress Notes (Signed)
Pharmacy Antibiotic Note  Catherine Berg is a 42 y.o. female admitted on 04/29/2023 with cellulitis.  Pharmacy has been consulted for Vancomcyin dosing.  Plan: Vancomycin 2000 mg IV loading dose Vancomycin 1750 mg IV Q 24 hrs. Goal AUC 400-550. Expected AUC: 503.1 SCr used: 0.82 Expected Cmin: 9.9   Height: 5\' 3"  (160 cm) Weight: 106.8 kg (235 lb 7.2 oz) IBW/kg (Calculated) : 52.4  Temp (24hrs), Avg:98.4 F (36.9 C), Min:98 F (36.7 C), Max:98.7 F (37.1 C)  Recent Labs  Lab 04/29/23 1516 04/29/23 1800  WBC 15.2*  --   CREATININE 0.82  --   LATICACIDVEN  --  0.7    Estimated Creatinine Clearance: 103.6 mL/min (by C-G formula based on SCr of 0.82 mg/dL).    Allergies  Allergen Reactions   Latex Hives    (Balloons, condoms, underwear elastic, gloves)   Shellfish Allergy Hives    Antimicrobials this admission: Ceftriaxone 12/26 >>  Vancomycin 12/26 >>   Dose adjustments this admission:  Microbiology results:  Thank you for allowing pharmacy to be a part of this patient's care.  Clovia Cuff, PharmD, BCPS 04/29/2023 10:25 PM

## 2023-04-30 DIAGNOSIS — N61 Mastitis without abscess: Secondary | ICD-10-CM | POA: Diagnosis not present

## 2023-04-30 LAB — BASIC METABOLIC PANEL
Anion gap: 10 (ref 5–15)
BUN: 9 mg/dL (ref 6–20)
CO2: 19 mmol/L — ABNORMAL LOW (ref 22–32)
Calcium: 8.5 mg/dL — ABNORMAL LOW (ref 8.9–10.3)
Chloride: 105 mmol/L (ref 98–111)
Creatinine, Ser: 0.75 mg/dL (ref 0.44–1.00)
GFR, Estimated: >60 mL/min (ref >60)
Glucose, Bld: 116 mg/dL — ABNORMAL HIGH (ref 70–99)
Potassium: 3.9 mmol/L (ref 3.5–5.1)
Sodium: 134 mmol/L — ABNORMAL LOW (ref 135–145)

## 2023-04-30 LAB — HIV ANTIBODY (ROUTINE TESTING W REFLEX): HIV Screen 4th Generation wRfx: NONREACTIVE

## 2023-04-30 LAB — CBC
HCT: 27 % — ABNORMAL LOW (ref 36.0–46.0)
Hemoglobin: 8.7 g/dL — ABNORMAL LOW (ref 12.0–15.0)
MCH: 27.4 pg (ref 26.0–34.0)
MCHC: 32.2 g/dL (ref 30.0–36.0)
MCV: 84.9 fL (ref 80.0–100.0)
Platelets: 435 10*3/uL — ABNORMAL HIGH (ref 150–400)
RBC: 3.18 MIL/uL — ABNORMAL LOW (ref 3.87–5.11)
RDW: 13.7 % (ref 11.5–15.5)
WBC: 14.6 10*3/uL — ABNORMAL HIGH (ref 4.0–10.5)
nRBC: 0 % (ref 0.0–0.2)

## 2023-04-30 LAB — SEDIMENTATION RATE: Sed Rate: 99 mm/h — ABNORMAL HIGH (ref 0–20)

## 2023-04-30 LAB — C-REACTIVE PROTEIN: CRP: 17 mg/dL — ABNORMAL HIGH (ref <1.0)

## 2023-04-30 MED ORDER — ORAL CARE MOUTH RINSE
15.0000 mL | OROMUCOSAL | Status: DC | PRN
  Administered 2023-05-01: 15 mL via OROMUCOSAL

## 2023-04-30 MED ORDER — VANCOMYCIN HCL 1250 MG/250ML IV SOLN
1250.0000 mg | Freq: Two times a day (BID) | INTRAVENOUS | Status: DC
  Administered 2023-04-30 – 2023-05-02 (×4): 1250 mg via INTRAVENOUS
  Filled 2023-04-30 (×6): qty 250

## 2023-04-30 MED ORDER — OXYCODONE-ACETAMINOPHEN 5-325 MG PO TABS
1.0000 | ORAL_TABLET | ORAL | Status: DC | PRN
  Administered 2023-04-30 – 2023-05-02 (×9): 2 via ORAL
  Administered 2023-05-02: 1 via ORAL
  Administered 2023-05-02 (×2): 2 via ORAL
  Filled 2023-04-30 (×12): qty 2

## 2023-04-30 MED ORDER — SODIUM CHLORIDE 0.9 % IV SOLN
INTRAVENOUS | Status: AC | PRN
Start: 2023-04-30 — End: 2023-05-01

## 2023-04-30 MED ORDER — ENOXAPARIN SODIUM 60 MG/0.6ML IJ SOSY
50.0000 mg | PREFILLED_SYRINGE | INTRAMUSCULAR | Status: DC
Start: 2023-04-30 — End: 2023-05-02
  Administered 2023-05-01 (×2): 50 mg via SUBCUTANEOUS
  Filled 2023-04-30 (×4): qty 0.6

## 2023-04-30 NOTE — Progress Notes (Signed)
I sincerely appreciate the primary care team's assistance in management of [name].  I performed a bilateral breast reduction on [name] on December 10.  There was a very large but uncomplicated reduction.  Drains were placed postoperatively and remained in place for 48 hours.  She was seen in the office and noted to be doing well.  Subsequently she developed pain and erythema in the left breast and was evaluated by her family medicine physician and sent to the emergency department.  [name] says that she had increasing pain in the left breast.  This was accompanied by erythema.  She denies any drainage from the incision.  On examination the skin over the lateral portion of the left breast especially in the axilla is erythematous and indurated.  The incisions remain intact.  She is not having any drainage.  The area is tender to palpation.  I had a long discussion with [name].  I agree with the management using IV antibiotics.  She has been told and agrees that if she does not make progress on the IV antibiotics that she will need to have surgical drainage of the area.  There currently is no finding on radiologic examination that would indicate there is an abscess but failure to progress on IV antibiotics would be an indication for drainage.  She also understands that if this is necessary she will need to be transferred to [facility].  Again I appreciate the primary care team managing [name].  When she is discharged I have asked her to call the office and arrange an appointment within a week of her discharge.

## 2023-04-30 NOTE — Progress Notes (Signed)
Pharmacy Antibiotic Note  Catherine Berg is a 42 y.o. female admitted on 04/29/2023 with cellulitis.  Pharmacy has been consulted for Vancomcyin dosing.  WBC 15.2>14.6 Tmax 98.6   Plan: Day 2 of antibiotics Increase vancomycin to 1250 mg IV Q12H. Goal AUC 400-550. Expected AUC: 498.1 Expected Css min: 13.5 SCr used: 0.8  Weight used: IBW, Vd used: 0.5 (BMI 41.71) Patient is also on ceftriaxone 2 g IV Q24H Continue to monitor renal function and follow culture results   Height: 5\' 3"  (160 cm) Weight: 106.8 kg (235 lb 7.2 oz) IBW/kg (Calculated) : 52.4  Temp (24hrs), Avg:98.1 F (36.7 C), Min:97.9 F (36.6 C), Max:98.6 F (37 C)  Recent Labs  Lab 04/29/23 1516 04/29/23 1800 04/30/23 0525  WBC 15.2*  --  14.6*  CREATININE 0.82  --  0.75  LATICACIDVEN  --  0.7  --     Estimated Creatinine Clearance: 106.2 mL/min (by C-G formula based on SCr of 0.75 mg/dL).    Allergies  Allergen Reactions   Latex Hives    (Balloons, condoms, underwear elastic, gloves)   Shellfish Allergy Hives    Antimicrobials this admission: Ceftriaxone 12/26 >>  Vancomycin 12/26 >>   Dose adjustments this admission: Vanc 1750 mg IV q24h>Vanc 1250 mg IV q12h  Microbiology results: 12/26 Bcx IP   Thank you for allowing pharmacy to be a part of this patient's care.  Paulita Fujita, PharmD Clinical Pharmacist 04/30/2023 11:08 AM

## 2023-04-30 NOTE — ED Notes (Signed)
PT admitted to 347 per [facility]

## 2023-04-30 NOTE — Progress Notes (Signed)
  PROGRESS NOTE    FLORALEE BRAWDY  ZOX:[id number] DOB: 1980-09-09 DOA: 04/29/2023 PCP: [name], MD  [id number]  LOS: 1 day   Brief hospital course:   Assessment & Plan: Catherine Berg is a 42 y.o. female with medical history significant of macromastia, HTN, asthma, Graves' disease --> hypothyroidism, depression with anxiety, obesity, who presents with left breast pain.   The patient had bilateral breast reduction by Dr. Ladona Ridgel at [facility] on 12/10 which was uncomplicated. Over the last week she has developed pain, redness and swelling to left breast.     Sepsis due to cellulitis of left breast:  Patient meets criteria for sepsis with WBC 15.2, heart rate up to 133.  Lactic acid normal 0.7. CT shows loosely organized fluid in the wrist but no discrete abscess. ED physician discussed with Dr. Ulice Bold of plastic surgery in [facility]. Per EDP, at this moment, will treat the patient with antibiotics, no surgical treatment needed. --cont vanc and ceftriaxone   HTN (hypertension) --hold amlodipine   Asthma: Stable -As needed albuterol and Mucinex   Hypothyroidism --cont Synthroid   Depression with anxiety --cont Buspar, Cymbalta   Morbid obesity (HCC): Body weight 106.8 kg, BMI 41.71 -Encourage losing weight -Exercise and healthy diet  Anemia --likely due to recent surgery and illness.  No signs of active bleeding. --monitor    DVT prophylaxis: Lovenox SQ Code Status: Full code  Family Communication: daughter updated at bedside today Level of care: Med-Surg Dispo:   The patient is from: home Anticipated d/c is to: home Anticipated d/c date is: 3-4 days   Subjective and Interval History:  Pt continued to have left breast pain.   Objective: Vitals:   04/30/23 1042 04/30/23 1600 04/30/23 1731 04/30/23 1907  BP:  128/84 (!) 141/78 125/73  Pulse:  93 (!) 101   Resp:  15 18 20   Temp: 97.9 F (36.6 C) 98.3 F (36.8 C) 98.3 F (36.8 C) 98.3 F (36.8  C)  TempSrc: Oral  Oral Oral  SpO2:  97% 95% 98%  Weight:      Height:        Intake/Output Summary (Last 24 hours) at 04/30/2023 2004 Last data filed at 04/30/2023 1337 Gross per 24 hour  Intake 1746.16 ml  Output --  Net 1746.16 ml   Filed Weights   04/29/23 1741  Weight: 106.8 kg    Examination:   Constitutional: NAD, AAOx3 HEENT: conjunctivae and lids normal, EOMI CV: No cyanosis.   RESP: normal respiratory effort, on RA Neuro: II - XII grossly intact.   Psych: Normal mood and affect.  Appropriate judgement and reason Left breast firm, edematous, with erythema.   Data Reviewed: I have personally reviewed labs and imaging studies  Time spent: 50 minutes  Darlin Priestly, MD [facility] If 7PM-7AM, please contact night-coverage 04/30/2023, 8:04 PM

## 2023-05-01 DIAGNOSIS — N61 Mastitis without abscess: Secondary | ICD-10-CM | POA: Diagnosis not present

## 2023-05-01 LAB — CBC
HCT: 28.6 % — ABNORMAL LOW (ref 36.0–46.0)
Hemoglobin: 9.5 g/dL — ABNORMAL LOW (ref 12.0–15.0)
MCH: 27.9 pg (ref 26.0–34.0)
MCHC: 33.2 g/dL (ref 30.0–36.0)
MCV: 83.9 fL (ref 80.0–100.0)
Platelets: 455 10*3/uL — ABNORMAL HIGH (ref 150–400)
RBC: 3.41 MIL/uL — ABNORMAL LOW (ref 3.87–5.11)
RDW: 13.5 % (ref 11.5–15.5)
WBC: 13.6 10*3/uL — ABNORMAL HIGH (ref 4.0–10.5)
nRBC: 0 % (ref 0.0–0.2)

## 2023-05-01 LAB — BASIC METABOLIC PANEL
Anion gap: 7 (ref 5–15)
BUN: 10 mg/dL (ref 6–20)
CO2: 23 mmol/L (ref 22–32)
Calcium: 8.9 mg/dL (ref 8.9–10.3)
Chloride: 103 mmol/L (ref 98–111)
Creatinine, Ser: 0.8 mg/dL (ref 0.44–1.00)
GFR, Estimated: >60 mL/min (ref >60)
Glucose, Bld: 113 mg/dL — ABNORMAL HIGH (ref 70–99)
Potassium: 4 mmol/L (ref 3.5–5.1)
Sodium: 133 mmol/L — ABNORMAL LOW (ref 135–145)

## 2023-05-01 LAB — MAGNESIUM: Magnesium: 2 mg/dL (ref 1.7–2.4)

## 2023-05-01 LAB — PROCALCITONIN: Procalcitonin: <0.1 ng/mL

## 2023-05-01 MED ORDER — MOMETASONE FURO-FORMOTEROL FUM 200-5 MCG/ACT IN AERO
2.0000 | INHALATION_SPRAY | Freq: Two times a day (BID) | RESPIRATORY_TRACT | Status: DC
  Administered 2023-05-01 (×2): 2 via RESPIRATORY_TRACT
  Filled 2023-05-01: qty 8.8

## 2023-05-01 MED ORDER — LINACLOTIDE 72 MCG PO CAPS
72.0000 ug | ORAL_CAPSULE | Freq: Every day | ORAL | Status: DC
  Administered 2023-05-02: 72 ug via ORAL
  Filled 2023-05-01: qty 1

## 2023-05-01 NOTE — Progress Notes (Signed)
  PROGRESS NOTE    Catherine Berg  BJY:[id number] DOB: 1980-12-17 DOA: 04/29/2023 PCP: [name], MD  [id number]  LOS: 2 days   Brief hospital course:   Assessment & Plan: NELIA HOSP is a 42 y.o. female with medical history significant of macromastia, HTN, asthma, Graves' disease --> hypothyroidism, depression with anxiety, obesity, who presents with left breast pain.   The patient had bilateral breast reduction by Dr. Ladona Ridgel at [facility] on 12/10 which was uncomplicated. Over the last week she has developed pain, redness and swelling to left breast.     Sepsis due to cellulitis of left breast:  Patient meets criteria for sepsis with WBC 15.2, heart rate up to 133.  Lactic acid normal 0.7. CT shows loosely organized fluid in the wrist but no discrete abscess. ED physician discussed with Dr. Ulice Bold of plastic surgery in [facility]. Per EDP, at this moment, will treat the patient with antibiotics, no surgical treatment needed. --reported drainage from incision site, photo taken.  Dr. Ladona Ridgel favored drainage to be serous. --cont vanc and ceftriaxone --If abscess develops, will need to transfer to [facility].  Dr Foster Simpson on call this weekend.   HTN (hypertension) --hold amlodipine   Asthma: Stable -As needed albuterol and Mucinex   Hypothyroidism --cont Synthroid   Depression with anxiety --cont Buspar, Cymbalta   Morbid obesity (HCC): Body weight 106.8 kg, BMI 41.71 -Encourage losing weight -Exercise and healthy diet  Anemia --likely due to recent surgery and illness.  No signs of active bleeding. --monitor    DVT prophylaxis: Lovenox SQ Code Status: Full code  Family Communication:  Level of care: Med-Surg Dispo:   The patient is from: home Anticipated d/c is to: home Anticipated d/c date is: 3-4 days   Subjective and Interval History:  RN and pt reported purulent drainage from the incision site of left breast since last night.  Photo taken  this morning, and messaged Dr. Ladona Ridgel who favored drainage to be serous.  Pt still reported pain with just slight touch to her left breast.   Objective: Vitals:   05/01/23 0352 05/01/23 0845 05/01/23 1212 05/01/23 1557  BP: 112/68 121/75 135/87 138/86  Pulse: 98 92 98 95  Resp: 20 17 18 18   Temp: 98.5 F (36.9 C) 98.7 F (37.1 C) 98.6 F (37 C) 98.6 F (37 C)  TempSrc:  Oral Oral Oral  SpO2: 99% 98%    Weight:      Height:        Intake/Output Summary (Last 24 hours) at 05/01/2023 1657 Last data filed at 05/01/2023 1234 Gross per 24 hour  Intake 704 ml  Output 2025 ml  Net -1321 ml   Filed Weights   04/29/23 1741  Weight: 106.8 kg    Examination:   Constitutional: NAD, AAOx3 HEENT: conjunctivae and lids normal, EOMI CV: No cyanosis.   RESP: normal respiratory effort, on RA Neuro: II - XII grossly intact.   Psych: Normal mood and affect.  Appropriate judgement and reason   Data Reviewed: I have personally reviewed labs and imaging studies  Time spent: 35 minutes  Darlin Priestly, MD [facility] If 7PM-7AM, please contact night-coverage 05/01/2023, 4:57 PM

## 2023-05-02 ENCOUNTER — Encounter (HOSPITAL_COMMUNITY): Payer: Self-pay | Admitting: Family Medicine

## 2023-05-02 ENCOUNTER — Encounter (HOSPITAL_COMMUNITY): Payer: Self-pay

## 2023-05-02 ENCOUNTER — Other Ambulatory Visit: Payer: Self-pay

## 2023-05-02 ENCOUNTER — Inpatient Hospital Stay (HOSPITAL_COMMUNITY)
Admission: EM | Admit: 2023-05-02 | Discharge: 2023-05-09 | DRG: 584 | Disposition: A | Discharge status: Home / Self Care | Payer: Medicare Other | Source: Other Acute Inpatient Hospital | Attending: Internal Medicine | Admitting: Internal Medicine

## 2023-05-02 DIAGNOSIS — E871 Hypo-osmolality and hyponatremia: Secondary | ICD-10-CM | POA: Diagnosis present

## 2023-05-02 DIAGNOSIS — K219 Gastro-esophageal reflux disease without esophagitis: Secondary | ICD-10-CM | POA: Diagnosis present

## 2023-05-02 DIAGNOSIS — Z8616 Personal history of COVID-19: Secondary | ICD-10-CM | POA: Diagnosis not present

## 2023-05-02 DIAGNOSIS — Z6841 Body Mass Index (BMI) 40.0 and over, adult: Secondary | ICD-10-CM

## 2023-05-02 DIAGNOSIS — Z833 Family history of diabetes mellitus: Secondary | ICD-10-CM | POA: Diagnosis not present

## 2023-05-02 DIAGNOSIS — T402X5A Adverse effect of other opioids, initial encounter: Secondary | ICD-10-CM | POA: Diagnosis present

## 2023-05-02 DIAGNOSIS — N61 Mastitis without abscess: Principal | ICD-10-CM | POA: Diagnosis present

## 2023-05-02 DIAGNOSIS — K5903 Drug induced constipation: Secondary | ICD-10-CM | POA: Diagnosis present

## 2023-05-02 DIAGNOSIS — J4520 Mild intermittent asthma, uncomplicated: Secondary | ICD-10-CM | POA: Diagnosis present

## 2023-05-02 DIAGNOSIS — E66813 Obesity, class 3: Secondary | ICD-10-CM | POA: Diagnosis present

## 2023-05-02 DIAGNOSIS — Z7951 Long term (current) use of inhaled steroids: Secondary | ICD-10-CM

## 2023-05-02 DIAGNOSIS — Z7984 Long term (current) use of oral hypoglycemic drugs: Secondary | ICD-10-CM

## 2023-05-02 DIAGNOSIS — Z8249 Family history of ischemic heart disease and other diseases of the circulatory system: Secondary | ICD-10-CM | POA: Diagnosis not present

## 2023-05-02 DIAGNOSIS — D62 Acute posthemorrhagic anemia: Secondary | ICD-10-CM | POA: Insufficient documentation

## 2023-05-02 DIAGNOSIS — E876 Hypokalemia: Secondary | ICD-10-CM | POA: Diagnosis present

## 2023-05-02 DIAGNOSIS — F32A Depression, unspecified: Secondary | ICD-10-CM | POA: Diagnosis present

## 2023-05-02 DIAGNOSIS — Z79890 Hormone replacement therapy (postmenopausal): Secondary | ICD-10-CM | POA: Diagnosis not present

## 2023-05-02 DIAGNOSIS — I10 Essential (primary) hypertension: Secondary | ICD-10-CM | POA: Diagnosis present

## 2023-05-02 DIAGNOSIS — Z91040 Latex allergy status: Secondary | ICD-10-CM | POA: Diagnosis not present

## 2023-05-02 DIAGNOSIS — F418 Other specified anxiety disorders: Secondary | ICD-10-CM | POA: Diagnosis not present

## 2023-05-02 DIAGNOSIS — T8142XA Infection following a procedure, deep incisional surgical site, initial encounter: Secondary | ICD-10-CM | POA: Diagnosis not present

## 2023-05-02 DIAGNOSIS — F419 Anxiety disorder, unspecified: Secondary | ICD-10-CM | POA: Diagnosis present

## 2023-05-02 DIAGNOSIS — Z79899 Other long term (current) drug therapy: Secondary | ICD-10-CM

## 2023-05-02 DIAGNOSIS — T8140XA Infection following a procedure, unspecified, initial encounter: Principal | ICD-10-CM | POA: Diagnosis present

## 2023-05-02 DIAGNOSIS — E039 Hypothyroidism, unspecified: Secondary | ICD-10-CM | POA: Diagnosis present

## 2023-05-02 DIAGNOSIS — N611 Abscess of the breast and nipple: Principal | ICD-10-CM | POA: Diagnosis present

## 2023-05-02 LAB — CBC
HCT: 28.6 % — ABNORMAL LOW (ref 36.0–46.0)
Hemoglobin: 9.3 g/dL — ABNORMAL LOW (ref 12.0–15.0)
MCH: 27.1 pg (ref 26.0–34.0)
MCHC: 32.5 g/dL (ref 30.0–36.0)
MCV: 83.4 fL (ref 80.0–100.0)
Platelets: 478 10*3/uL — ABNORMAL HIGH (ref 150–400)
RBC: 3.43 MIL/uL — ABNORMAL LOW (ref 3.87–5.11)
RDW: 13.4 % (ref 11.5–15.5)
WBC: 13.7 10*3/uL — ABNORMAL HIGH (ref 4.0–10.5)
nRBC: 0 % (ref 0.0–0.2)

## 2023-05-02 LAB — BASIC METABOLIC PANEL
Anion gap: 9 (ref 5–15)
BUN: 9 mg/dL (ref 6–20)
CO2: 21 mmol/L — ABNORMAL LOW (ref 22–32)
Calcium: 8.7 mg/dL — ABNORMAL LOW (ref 8.9–10.3)
Chloride: 101 mmol/L (ref 98–111)
Creatinine, Ser: 0.77 mg/dL (ref 0.44–1.00)
GFR, Estimated: >60 mL/min (ref >60)
Glucose, Bld: 113 mg/dL — ABNORMAL HIGH (ref 70–99)
Potassium: 3.8 mmol/L (ref 3.5–5.1)
Sodium: 131 mmol/L — ABNORMAL LOW (ref 135–145)

## 2023-05-02 LAB — MAGNESIUM: Magnesium: 2 mg/dL (ref 1.7–2.4)

## 2023-05-02 MED ORDER — HYDROXYZINE HCL 10 MG PO TABS: 10.0000 mg | ORAL_TABLET | Freq: Every day | ORAL | Status: DC | PRN

## 2023-05-02 MED ORDER — VANCOMYCIN HCL 1250 MG/250ML IV SOLN: 1250.0000 mg | Freq: Two times a day (BID) | INTRAVENOUS | Status: DC

## 2023-05-02 MED ORDER — VANCOMYCIN HCL 1750 MG/350ML IV SOLN
1750.0000 mg | INTRAVENOUS | Status: DC
  Filled 2023-05-02: qty 350

## 2023-05-02 MED ORDER — OXYCODONE-ACETAMINOPHEN 5-325 MG PO TABS
1.0000 | ORAL_TABLET | ORAL | Status: DC | PRN
  Administered 2023-05-02 – 2023-05-05 (×12): 2 via ORAL
  Administered 2023-05-05 (×2): 1 via ORAL
  Administered 2023-05-06: 2 via ORAL
  Filled 2023-05-02 (×5): qty 2
  Filled 2023-05-02 (×2): qty 1
  Filled 2023-05-02 (×8): qty 2

## 2023-05-02 MED ORDER — ALBUTEROL SULFATE (2.5 MG/3ML) 0.083% IN NEBU: 2.5000 mg | INHALATION_SOLUTION | Freq: Four times a day (QID) | RESPIRATORY_TRACT | Status: DC | PRN

## 2023-05-02 MED ORDER — VANCOMYCIN HCL 1.25 G IV SOLR
1250.0000 mg | Freq: Two times a day (BID) | INTRAVENOUS | Status: DC
  Filled 2023-05-02: qty 25

## 2023-05-02 MED ORDER — SODIUM CHLORIDE 0.9 % IV SOLN: 2.0000 g | INTRAVENOUS | Status: DC

## 2023-05-02 MED ORDER — TOPIRAMATE 25 MG PO TABS
100.0000 mg | ORAL_TABLET | Freq: Every day | ORAL | Status: DC
  Administered 2023-05-02 – 2023-05-08 (×7): 100 mg via ORAL
  Filled 2023-05-02 (×7): qty 4

## 2023-05-02 MED ORDER — HYDROMORPHONE HCL 1 MG/ML IJ SOLN
0.5000 mg | INTRAMUSCULAR | Status: DC | PRN
Start: 2023-05-02 — End: 2023-05-02

## 2023-05-02 MED ORDER — ACETAMINOPHEN 650 MG RE SUPP: 650.0000 mg | Freq: Four times a day (QID) | RECTAL | Status: DC | PRN

## 2023-05-02 MED ORDER — ACETAMINOPHEN 325 MG PO TABS
650.0000 mg | ORAL_TABLET | Freq: Four times a day (QID) | ORAL | Status: DC | PRN
  Administered 2023-05-07 – 2023-05-08 (×4): 650 mg via ORAL
  Filled 2023-05-02 (×4): qty 2

## 2023-05-02 MED ORDER — VANCOMYCIN HCL 1250 MG/250ML IV SOLN
1250.0000 mg | Freq: Two times a day (BID) | INTRAVENOUS | Status: DC
  Administered 2023-05-02: 1250 mg via INTRAVENOUS
  Filled 2023-05-02 (×2): qty 250

## 2023-05-02 MED ORDER — OXYCODONE-ACETAMINOPHEN 5-325 MG PO TABS: 1.0000 | ORAL_TABLET | ORAL | Status: DC | PRN

## 2023-05-02 MED ORDER — SODIUM CHLORIDE 0.9 % IV SOLN: INTRAVENOUS | Status: DC | PRN

## 2023-05-02 MED ORDER — DULOXETINE HCL 60 MG PO CPEP
60.0000 mg | ORAL_CAPSULE | Freq: Every day | ORAL | Status: DC
  Administered 2023-05-03 – 2023-05-09 (×7): 60 mg via ORAL
  Filled 2023-05-02 (×7): qty 1

## 2023-05-02 MED ORDER — BUSPIRONE HCL 5 MG PO TABS
5.0000 mg | ORAL_TABLET | Freq: Two times a day (BID) | ORAL | Status: DC
  Administered 2023-05-02 – 2023-05-09 (×14): 5 mg via ORAL
  Filled 2023-05-02 (×14): qty 1

## 2023-05-02 MED ORDER — POLYETHYLENE GLYCOL 3350 17 G PO PACK
17.0000 g | PACK | Freq: Every day | ORAL | Status: DC | PRN
  Administered 2023-05-04: 17 g via ORAL
  Filled 2023-05-02: qty 1

## 2023-05-02 MED ORDER — ONDANSETRON HCL 4 MG PO TABS
4.0000 mg | ORAL_TABLET | Freq: Four times a day (QID) | ORAL | Status: DC | PRN
Start: 2023-05-02 — End: 2023-05-09

## 2023-05-02 MED ORDER — TRAZODONE HCL 50 MG PO TABS
50.0000 mg | ORAL_TABLET | Freq: Every evening | ORAL | Status: DC | PRN
  Administered 2023-05-02 – 2023-05-07 (×2): 50 mg via ORAL
  Filled 2023-05-02 (×2): qty 1

## 2023-05-02 MED ORDER — OXYCODONE HCL 5 MG PO TABS
5.0000 mg | ORAL_TABLET | ORAL | Status: DC | PRN
  Filled 2023-05-02: qty 1

## 2023-05-02 MED ORDER — AMLODIPINE BESYLATE 5 MG PO TABS
5.0000 mg | ORAL_TABLET | Freq: Every day | ORAL | Status: DC
Start: 2023-05-03 — End: 2023-05-09
  Administered 2023-05-03 – 2023-05-09 (×7): 5 mg via ORAL
  Filled 2023-05-02 (×7): qty 1

## 2023-05-02 MED ORDER — LEVOTHYROXINE SODIUM 100 MCG PO TABS
100.0000 ug | ORAL_TABLET | Freq: Every day | ORAL | Status: DC
  Administered 2023-05-03 – 2023-05-09 (×6): 100 ug via ORAL
  Filled 2023-05-02 (×7): qty 1

## 2023-05-02 MED ORDER — ONDANSETRON HCL 4 MG/2ML IJ SOLN
4.0000 mg | Freq: Four times a day (QID) | INTRAMUSCULAR | Status: DC | PRN
  Administered 2023-05-03 – 2023-05-04 (×2): 4 mg via INTRAVENOUS
  Filled 2023-05-02 (×2): qty 2

## 2023-05-02 NOTE — Progress Notes (Signed)
[facility] here to transfer pt. Report given VSS. Sister with patient and all belongings packed and with pt.

## 2023-05-02 NOTE — H&P (Signed)
History and Physical    Catherine Berg:[id number] DOB: 22-Sep-1980 DOA: 05/02/2023  PCP: [name], MD   Patient coming from: Home   Chief Complaint: Left breast infection   HPI: Catherine Berg is a 42 y.o. female with medical history significant for hypertension, hypothyroidism, asthma, depression, anxiety, BMI 42, and symptomatic macromastia s/p bilateral breast reduction surgery on 04/13/2023 who presents with concerns for infection of the left breast.    [facility] ED & Hospital Course: Patient presented to the ED on 04/29/2023 with severe left breast pain, redness, and heat.  She also reported a fever to 101F at home.  She had blood cultures drawn which have been negative to date.  CT in the ED demonstrated loosely organized fluid collections in the bilateral breasts.  She was admitted to the hospitalist service on 04/29/2023, started on Rocephin and vancomycin, but failed to show any clinical improvement, developed malodorous purulent drainage, and plastic surgeon (Dr. Ladona Ridgel) recommended transfer to [facility] for likely operative management.  Review of Systems:  All other systems reviewed and apart from HPI, are negative.  Past Medical History:  Diagnosis Date   Abnormal thyroid blood test    Anemia    Anxiety    Asthma    Complication of anesthesia    itching after c sections   COVID-19 01/2020   Depression    Elevated serum glutamic pyruvic transaminase (SGPT) level    Graves disease    History of abnormal cervical Pap smear    History of cervical polypectomy    Hives 09/02/2015   Hypertension    Hypothyroidism    IFG (impaired fasting glucose)    Incidental lung nodule, > 3mm and < 8mm 03/31/2017   4 mm RLL lung nodule on chest CT Mar 31, 2017   Low serum vitamin D    Obesity    PONV (postoperative nausea and vomiting)    after c section had vomit   Pregnancy induced hypertension    with 1st pregnancy, normal pressure with 2nd   Reflux    Sprain  of right great toe 01/21/2023   Thyroid disease    Vitamin B12 deficiency    Vitamin D deficiency disease    Wears dentures    full upper    Past Surgical History:  Procedure Laterality Date   BREAST REDUCTION SURGERY Bilateral 04/13/2023   Procedure: MAMMARY REDUCTION  (BREAST);  Surgeon: [name], MD;  Location: [facility];  Service: Plastics;  Laterality: Bilateral;   CERVICAL POLYPECTOMY     CESAREAN SECTION     x 2   COLONOSCOPY WITH PROPOFOL N/A 11/14/2018   Procedure: COLONOSCOPY WITH PROPOFOL;  Surgeon: Pasty Spillers, MD;  Location: [facility];  Service: Endoscopy;  Laterality: N/A;   ESOPHAGOGASTRODUODENOSCOPY (EGD) WITH PROPOFOL N/A 11/14/2018   Procedure: ESOPHAGOGASTRODUODENOSCOPY (EGD) WITH PROPOFOL;  Surgeon: Pasty Spillers, MD;  Location: [facility];  Service: Endoscopy;  Laterality: N/A;  Latex allergy   GIVENS CAPSULE STUDY N/A 12/27/2018   Procedure: GIVENS CAPSULE STUDY;  Surgeon: Pasty Spillers, MD;  Location: [facility];  Service: Endoscopy;  Laterality: N/A;    Social History:   reports that she has never smoked. She has never used smokeless tobacco. She reports that she does not drink alcohol and does not use drugs.  Allergies  Allergen Reactions   Latex Hives    (Balloons, condoms, underwear elastic, gloves)   Shellfish Allergy Hives  Family History  Problem Relation Age of Onset   Heart attack Mother 34   Hypertension Mother    Asthma Mother    Cancer Mother        laryngeal   Diabetes Mother        pre diabetic   Heart disease Mother    Mental illness Mother    Alcohol abuse Mother    Drug abuse Mother    Depression Mother    Anxiety disorder Mother    Bipolar disorder Mother    Learning disabilities Brother    ADD / ADHD Brother    Asthma Son    Hyperlipidemia Brother    Alcohol abuse Father    Heart disease Maternal Grandmother        heart attack, pacemaker   Heart  attack Maternal Grandmother    Hypertension Maternal Grandmother    Hypertension Maternal Grandfather    Heart disease Paternal Grandmother        couple of major open heart surgeries, leaking valves   Hypertension Paternal Grandmother    Hypertension Paternal Grandfather    Breast cancer Neg Hx      Prior to Admission medications   Medication Sig Start Date End Date Taking? Authorizing Provider  amLODipine (NORVASC) 5 MG tablet Take 1 tablet (5 mg total) by mouth daily. 12/14/22   [name], MD  busPIRone (BUSPAR) 5 MG tablet Take 5 mg by mouth 2 (two) times daily. 04/13/23   [provider]  cefTRIAXone 2 g in sodium chloride 0.9 % 100 mL Inject 2 g into the vein daily. 05/03/23   Darlin Priestly, MD  DULoxetine (CYMBALTA) 60 MG capsule Take 1 capsule (60 mg total) by mouth daily. Total of 90 mg daily. Take along with 30 mg cap 12/01/22 04/30/23  [name], MD  hydrOXYzine (ATARAX) 10 MG tablet Take 1 tablet (10 mg total) by mouth daily as needed for anxiety. 02/26/23 06/26/23  [name], MD  levothyroxine (SYNTHROID) 100 MCG tablet Take 100 mcg by mouth daily before breakfast.    Sherlon Handing, MD  linaclotide [facility]) 72 MCG capsule Take 1 capsule (72 mcg total) by mouth daily before breakfast. 03/22/23   Berniece Salines, FNP  metFORMIN (GLUCOPHAGE) 500 MG tablet Take 1 tablet (500 mg total) by mouth daily with breakfast. Patient taking differently: Take 500 mg by mouth daily with breakfast. Takes for long covid neuropathy 12/24/22   Raulkar, Drema Pry, MD  mometasone-formoterol (DULERA) 200-5 MCG/ACT AERO Inhale 2 puffs into the lungs in the morning and at bedtime. 05/12/22   Parrett, Virgel Bouquet, NP  montelukast (SINGULAIR) 10 MG tablet Take 1 tablet (10 mg total) by mouth at bedtime. 05/12/22   Parrett, Virgel Bouquet, NP  omeprazole (PRILOSEC) 20 MG capsule Take 1 capsule (20 mg total) by mouth 2 (two) times daily before a meal. 02/25/23   Berniece Salines, FNP  oxyCODONE (OXY  IR/ROXICODONE) 5 MG immediate release tablet Take 5 mg by mouth every 8 (eight) hours as needed for moderate pain (pain score 4-6).    [provider]  oxyCODONE-acetaminophen (PERCOCET/ROXICET) 5-325 MG tablet Take 1-2 tablets by mouth every 4 (four) hours as needed for moderate pain (pain score 4-6). 05/02/23   Darlin Priestly, MD  topiramate (TOPAMAX) 100 MG tablet Take 1 tablet (100 mg total) by mouth at bedtime. 12/24/22   Raulkar, Drema Pry, MD  traZODone (DESYREL) 50 MG tablet Take 1 tablet (50 mg total) by mouth at bedtime as  needed for sleep. 02/26/23 06/26/23  [name], MD  vancomycin (VANCOREADY) 1250 MG/250ML SOLN Inject 250 mLs (1,250 mg total) into the vein every 12 (twelve) hours. 05/03/23   Darlin Priestly, MD  VENTOLIN HFA 108 (90 Base) MCG/ACT inhaler INHALE 2 PUFFS BY MOUTH EVERY 6 HOURS AS NEEDED FOR WHEEZING OR SHORTNESS OF BREATH 07/30/22   Erin Fulling, MD  Vitamin D, Ergocalciferol, (DRISDOL) 1.25 MG (50000 UNIT) CAPS capsule Take 1 capsule (50,000 Units total) by mouth every 7 (seven) days. 12/14/22   [name], MD    Physical Exam: There were no vitals filed for this visit.   Constitutional: NAD, calm  Eyes: PERTLA, lids and conjunctivae normal ENMT: Mucous membranes are moist. Posterior pharynx clear of any exudate or lesions.   Neck: supple, no masses  Respiratory: no wheezing, no crackles. No accessory muscle use.  Cardiovascular: S1 & S2 heard, regular rate and rhythm. No extremity edema. No significant JVD. Abdomen: No distension, no tenderness, soft. Bowel sounds active.  Musculoskeletal: no clubbing / cyanosis. No joint deformity upper and lower extremities.   Skin: Erythema, heat, tenderness, and drainage involving left breast; scant drainage from right breast incision. Skin otherwise warm, dry, well-perfused. Neurologic: CN 2-12 grossly intact. Moving all extremities. Alert and oriented.  Psychiatric: Pleasant. Cooperative.    Labs and Imaging on  Admission: I have personally reviewed following labs and imaging studies  CBC: Recent Labs  Lab 04/29/23 1516 04/30/23 0525 05/01/23 0516 05/02/23 0617  WBC 15.2* 14.6* 13.6* 13.7*  HGB 9.7* 8.7* 9.5* 9.3*  HCT 29.4* 27.0* 28.6* 28.6*  MCV 83.1 84.9 83.9 83.4  PLT 474* 435* 455* 478*   Basic Metabolic Panel: Recent Labs  Lab 04/29/23 1516 04/30/23 0525 05/01/23 0516 05/02/23 0617  NA 133* 134* 133* 131*  K 3.7 3.9 4.0 3.8  CL 103 105 103 101  CO2 19* 19* 23 21*  GLUCOSE 141* 116* 113* 113*  BUN 11 9 10 9   CREATININE 0.82 0.75 0.80 0.77  CALCIUM 8.7* 8.5* 8.9 8.7*  MG  --   --  2.0 2.0   GFR: Estimated Creatinine Clearance: 106.2 mL/min (by C-G formula based on SCr of 0.77 mg/dL). Liver Function Tests: No results for input(s): "AST", "ALT", "ALKPHOS", "BILITOT", "PROT", "ALBUMIN" in the last 168 hours. No results for input(s): "LIPASE", "AMYLASE" in the last 168 hours. No results for input(s): "AMMONIA" in the last 168 hours. Coagulation Profile: No results for input(s): "INR", "PROTIME" in the last 168 hours. Cardiac Enzymes: No results for input(s): "CKTOTAL", "CKMB", "CKMBINDEX", "TROPONINI" in the last 168 hours. BNP (last 3 results) No results for input(s): "PROBNP" in the last 8760 hours. HbA1C: No results for input(s): "HGBA1C" in the last 72 hours. CBG: No results for input(s): "GLUCAP" in the last 168 hours. Lipid Profile: No results for input(s): "CHOL", "HDL", "LDLCALC", "TRIG", "CHOLHDL", "LDLDIRECT" in the last 72 hours. Thyroid Function Tests: No results for input(s): "TSH", "T4TOTAL", "FREET4", "T3FREE", "THYROIDAB" in the last 72 hours. Anemia Panel: No results for input(s): "VITAMINB12", "FOLATE", "FERRITIN", "TIBC", "IRON", "RETICCTPCT" in the last 72 hours. Urine analysis:    Component Value Date/Time   COLORURINE DARK YELLOW 03/30/2017 1642   APPEARANCEUR CLEAR 03/30/2017 1642   APPEARANCEUR Cloudy (A) 07/21/2016 1625   LABSPEC 1.014  03/30/2017 1642   LABSPEC 1.015 08/13/2012 2249   PHURINE 6.0 03/30/2017 1642   GLUCOSEU NEGATIVE 03/30/2017 1642   GLUCOSEU Negative 08/13/2012 2249   HGBUR 2+ (A) 03/30/2017 1642   BILIRUBINUR Negative  12/14/2022 1029   BILIRUBINUR Negative 07/21/2016 1625   BILIRUBINUR Negative 08/13/2012 2249   KETONESUR NEGATIVE 03/30/2017 1642   PROTEINUR Negative 12/14/2022 1029   PROTEINUR NEGATIVE 03/30/2017 1642   UROBILINOGEN 0.2 12/14/2022 1029   NITRITE Negative 12/14/2022 1029   NITRITE NEGATIVE 09/22/2016 1626   LEUKOCYTESUR Negative 12/14/2022 1029   LEUKOCYTESUR Negative 07/21/2016 1625   LEUKOCYTESUR Negative 08/13/2012 2249   Sepsis Labs: @LABRCNTIP (procalcitonin:4,lacticidven:4) ) Recent Results (from the past 240 hours)  Culture, blood (routine x 2)     Status: None (Preliminary result)   Collection Time: 04/29/23  6:00 PM   Specimen: BLOOD  Result Value Ref Range Status   Specimen Description BLOOD BLOOD RIGHT HAND  Final   Special Requests   Final    BOTTLES DRAWN AEROBIC AND ANAEROBIC Blood Culture results may not be optimal due to an inadequate volume of blood received in culture bottles   Culture   Final    NO GROWTH 3 DAYS Performed at [facility], [address], [place], [place] [address]    Report Status PENDING  Incomplete  Culture, blood (routine x 2)     Status: None (Preliminary result)   Collection Time: 04/29/23  6:30 PM   Specimen: BLOOD  Result Value Ref Range Status   Specimen Description BLOOD LEFT ANTECUBITAL  Final   Special Requests   Final    BOTTLES DRAWN AEROBIC AND ANAEROBIC Blood Culture results may not be optimal due to an inadequate volume of blood received in culture bottles   Culture   Final    NO GROWTH 3 DAYS Performed at [facility], [address], [place], [place] [address]    Report Status PENDING  Incomplete     Radiological Exams on Admission: No results found.   Assessment/Plan   1. Left breast  infection  - Continue Rocephin and vancomycin, keep NPO pending after midnight    2. Hypertension  - Norvasc    3. Depression, anxiety  - Continue Buspar, Cymbalta, trazodone, as-needed hydroxyzine    4. Asthma  - Not in exacerbation  - Albuterol as needed    5. Hypothyroidism  - Synthroid   6. Anemia  - No overt bleeding, monitor     DVT prophylaxis: SCDs  Code Status: Full  Level of Care: Level of care: Med-Surg Family Communication: None present   Disposition Plan:  Patient is from: home  Anticipated d/c is to: Home  Anticipated d/c date is: 05/05/23  Patient currently: Pending plastic surgery consultation and likely operative management of breast infection  Consults called: Plastic surgery  Admission status: Inpatient     Briscoe Deutscher, MD [facility]  05/02/2023, 9:35 PM

## 2023-05-02 NOTE — Progress Notes (Signed)
Pt dressing changed bilateral breast. Left side had small amount of purulent drainage noted scant amount of purulent drainage from right breast. Left breast had deepened redness that was marked last night shift. No change from marked area, but previous night nurse noted a deeper red appearance.

## 2023-05-02 NOTE — Progress Notes (Signed)
Spoke with Dr.Lai and Dr.Taylor about wanting pt to be transferred to  [facility] for pt to be able to go back to OR tomorrow. Awaiting transfer orders and bed placement.

## 2023-05-02 NOTE — Progress Notes (Signed)
Pt. Has been afebrile. HR regular. B/P's borderline elevated;her Home Amlodipine has not been ordered since admission . Color is good, skin w&d with positive pedal pulses equal and strong. Cap. Refill < 2 seconds. She has continuous purulent drainage from lower left Breast that is foul in odor and white to tan in color. The red area to top of left breast is within marked margins. Both breast are extremely firm and painful; Pt. Requires 2 Percocet Q4h. She states the Percocet "works."  Her right breast had had a small amount of thick white drainage beneath the breast. I encouraged Pt. To ambulate in hall and sit up on chair today so that her strength is maintained. She v/o. Pt. States lack of appetite and I encouraged her to try and eat something every meal. May need High Calorie supplement with meals. Pt. Appears comfortable and in NAD at this time.

## 2023-05-02 NOTE — Progress Notes (Signed)
Pharmacy Antibiotic Note  Catherine Berg is a 42 y.o. female admitted on 05/02/2023 with cellulitis.  Pharmacy has been consulted for vancomycin dosing. Patient was started on vancomycin on 12/26, last dose 1250 mg was given @1251  today.  Plan: Vancomycin 1750 mg IV every 24 hours. (estAUC 491.7, SCr used 0.8, Vd 0.5) x 3 more days Monitor clinical progress, cultures/sensitivities, renal function, abx plan Vancomycin levels as indicated Plan for OR 12/30    Temp (24hrs), Avg:98.7 F (37.1 C), Min:98.1 F (36.7 C), Max:99.3 F (37.4 C)  Recent Labs  Lab 04/29/23 1516 04/29/23 1800 04/30/23 0525 05/01/23 0516 05/02/23 0617  WBC 15.2*  --  14.6* 13.6* 13.7*  CREATININE 0.82  --  0.75 0.80 0.77  LATICACIDVEN  --  0.7  --   --   --     Estimated Creatinine Clearance: 106.2 mL/min (by C-G formula based on SCr of 0.77 mg/dL).    Allergies  Allergen Reactions   Latex Hives    (Balloons, condoms, underwear elastic, gloves)   Shellfish Allergy Hives    Antimicrobials this admission: 12/26 vanc >> plan 7 days 12/26 ceftriaxone >> 12/29  Dose adjustments this admission: 12/27: dose adjusted from 1750 mg q24h to 1250 mg q12h  12/29: adjusting dose to 1750 mg q 24 hours  Microbiology results: 12/26 BCx: ng x 3d    Thank you for allowing pharmacy to be a part of this patients care.   Signe Colt, PharmD 05/02/2023 10:08 PM  **Pharmacist phone directory can be found on [email or web address] listed under [facility]**

## 2023-05-02 NOTE — Discharge Summary (Signed)
Physician Discharge Summary   Catherine Berg  female DOB: [birth year]  BMW:[id number]  PCP: [name], MD  Admit date: 04/29/2023 Discharge date: 05/02/2023  Admitted From: home Disposition:  transfer to [facility] CODE STATUS: Full code  Discharge Instructions     No wound care   Complete by: As directed       Hospital Course:  For full details, please see H&P, progress notes, consult notes and ancillary notes.  Briefly,  Catherine Berg is a 42 y.o. female with medical history significant of macromastia, HTN, Graves' disease --> hypothyroidism, obesity, who presented with left breast pain.   The patient had bilateral breast reduction by Dr. Ladona Ridgel at [facility] on 12/10 which was uncomplicated.  Over the last week she has developed pain, redness and swelling to left breast.     Sepsis due to cellulitis of left breast:  Likely developing abscess Patient meets criteria for sepsis with WBC 15.2, heart rate up to 133.  Lactic acid normal 0.7.  Left breast with erythema, tense swelling and very tender to just light touch.  CT showed "Loosely organized fluid in both breasts, greater on the left", initially thought to be more likely postoperative seromas/hematomas.  ED physician discussed with Dr. Ulice Bold of plastic surgery in [facility], who rec admission with antibiotics.  Pt was started on Vanc and ceftriaxone.   --2 days after admission, drainage started to seep out from the incision site.  Today, the drainage appeared milky white, likely purulent.  Photo taken and messaged Dr. Ladona Ridgel who rec transferring pt to [facility] for surgical drainage of the area on Monday. --cont vanc and ceftriaxone.  NPO at [facility] for likely OR on Monday 05/03/23.   HTN (hypertension) --cont home amlodipine   Asthma:  Stable   Hypothyroidism --cont Synthroid   Depression with anxiety --cont Buspar, Cymbalta --Atarax PRN for anxiety   Morbid obesity (HCC), BMI 41.71   Anemia --Hgb was 12.4 on  02/25/23, now in 9's.  Likely due to recent surgery and active infection.  No signs of active bleeding.    Unless noted above, medications under "STOP" list are ones pt was not taking PTA.  Discharge Diagnoses:  Principal Problem:   Cellulitis of left breast Active Problems:   Sepsis (HCC)   HTN (hypertension)   Asthma   Hypothyroidism   Depression with anxiety   Morbid obesity (HCC)   30 Day Unplanned Readmission Risk Score    Flowsheet Row ED to Hosp-Admission (Current) from 04/29/2023 in [facility]  30 Day Unplanned Readmission Risk Score (%) 11.27 Filed at 05/02/2023 1600       This score is the patient's risk of an unplanned readmission within 30 days of being discharged (0 -100%). The score is based on dignosis, age, lab data, medications, orders, and past utilization.   Low:  0-14.9   Medium: 15-21.9   High: 22-29.9   Extreme: 30 and above         Discharge Instructions:  Allergies as of 05/02/2023       Reactions   Latex Hives   (Balloons, condoms, underwear elastic, gloves)   Shellfish Allergy Hives        Medication List     STOP taking these medications    Azelastine HCl 137 MCG/SPRAY Soln   dicyclomine 10 MG capsule Commonly known as: BENTYL   etonogestrel 68 MG Impl implant Commonly known as: NEXPLANON   senna 8.6 MG tablet Commonly known  as: Senokot   sulfamethoxazole-trimethoprim 800-160 MG tablet Commonly known as: BACTRIM DS   traMADol 50 MG tablet Commonly known as: ULTRAM       TAKE these medications    amLODipine 5 MG tablet Commonly known as: NORVASC Take 1 tablet (5 mg total) by mouth daily.   B-12 1000 MCG Subl Place 1 tablet under the tongue daily.   busPIRone 5 MG tablet Commonly known as: BUSPAR Take 5 mg by mouth 2 (two) times daily.   cefTRIAXone 2 g in sodium chloride 0.9 % 100 mL Inject 2 g into the vein daily. Start taking on: May 03, 2023   [facility] 200-5 MCG/ACT  Aero Generic drug: mometasone-formoterol Inhale 2 puffs into the lungs in the morning and at bedtime.   DULoxetine 60 MG capsule Commonly known as: CYMBALTA Take 1 capsule (60 mg total) by mouth daily. Total of 90 mg daily. Take along with 30 mg cap   hydrOXYzine 10 MG tablet Commonly known as: ATARAX Take 1 tablet (10 mg total) by mouth daily as needed for anxiety.   levothyroxine 100 MCG tablet Commonly known as: SYNTHROID Take 100 mcg by mouth daily before breakfast.   linaclotide 72 MCG capsule Commonly known as: Linzess Take 1 capsule (72 mcg total) by mouth daily before breakfast.   metFORMIN 500 MG tablet Commonly known as: GLUCOPHAGE Take 1 tablet (500 mg total) by mouth daily with breakfast. What changed: additional instructions   montelukast 10 MG tablet Commonly known as: SINGULAIR Take 1 tablet (10 mg total) by mouth at bedtime.   omeprazole 20 MG capsule Commonly known as: PRILOSEC Take 1 capsule (20 mg total) by mouth 2 (two) times daily before a meal.   ondansetron 4 MG disintegrating tablet Commonly known as: ZOFRAN-ODT Take 1 tablet (4 mg total) by mouth every 8 (eight) hours as needed for nausea or vomiting.   oxyCODONE-acetaminophen 5-325 MG tablet Commonly known as: PERCOCET/ROXICET Take 1-2 tablets by mouth every 4 (four) hours as needed for moderate pain (pain score 4-6).   topiramate 100 MG tablet Commonly known as: Topamax Take 1 tablet (100 mg total) by mouth at bedtime.   traZODone 50 MG tablet Commonly known as: DESYREL Take 1 tablet (50 mg total) by mouth at bedtime as needed for sleep.   vancomycin 1250 MG/250ML Soln Commonly known as: VANCOREADY Inject 250 mLs (1,250 mg total) into the vein every 12 (twelve) hours. Start taking on: May 03, 2023   Ventolin HFA 108 (90 Base) MCG/ACT inhaler Generic drug: albuterol INHALE 2 PUFFS BY MOUTH EVERY 6 HOURS AS NEEDED FOR WHEEZING OR SHORTNESS OF BREATH   Vitamin D (Ergocalciferol)  1.25 MG (50000 UNIT) Caps capsule Commonly known as: DRISDOL Take 1 capsule (50,000 Units total) by mouth every 7 (seven) days.          Allergies  Allergen Reactions   Latex Hives    (Balloons, condoms, underwear elastic, gloves)   Shellfish Allergy Hives     The results of significant diagnostics from this hospitalization (including imaging, microbiology, ancillary and laboratory) are listed below for reference.   Consultations:   Procedures/Studies: CT Chest W Contrast Result Date: 04/29/2023 CLINICAL DATA:  Soft tissue infection suspected, left breast postop from reduction 2 weeks ago with increased swelling, induration, erythema eval for fluid collection versus cellulitis EXAM: CT CHEST WITH CONTRAST TECHNIQUE: Multidetector CT imaging of the chest was performed during intravenous contrast administration. RADIATION DOSE REDUCTION: This exam was performed according to the departmental dose-optimization  program which includes automated exposure control, adjustment of the mA and/or kV according to patient size and/or use of iterative reconstruction technique. CONTRAST:  OMNIPAQUE IOHEXOL 300 MG/ML  SOLN COMPARISON:  CT 09/20/2020 FINDINGS: Cardiovascular: Normal heart size. No pericardial effusion. Normal caliber thoracic aorta. Mediastinum/Nodes: Trachea and esophagus are unremarkable. Lungs/Pleura: Mild bibasilar atelectasis. The lungs are otherwise clear. No pleural effusion or pneumothorax. Upper Abdomen: No acute abnormality. Musculoskeletal: Multiple loosely organized fluid collections in the left breast. The largest is in the lateral left breast and measures 3.6 x 3.3 x 11.5 cm (series 2/image 70 and 5/61). A tract of fluid from the fluid collection extends into the subareolar region and into the medial breast where there is a smaller 2.6 cm collection. Within the lateral right breast there is a 3.0 x 1.7 x 3.0 cm fluid collection. Fluid extends medially to the subareolar  space. There is adjacent fat stranding about the fluid collections in both breasts. No acute fracture. IMPRESSION: Loosely organized fluid in both breasts, greater on the left, likely represents postoperative seromas/hematomas. Superimposed infection is not excluded by imaging. Electronically Signed   By: Minerva Fester M.D.   On: 04/29/2023 19:23      Labs: BNP (last 3 results) No results for input(s): "BNP" in the last 8760 hours. Basic Metabolic Panel: Recent Labs  Lab 04/29/23 1516 04/30/23 0525 05/01/23 0516 05/02/23 0617  NA 133* 134* 133* 131*  K 3.7 3.9 4.0 3.8  CL 103 105 103 101  CO2 19* 19* 23 21*  GLUCOSE 141* 116* 113* 113*  BUN 11 9 10 9   CREATININE 0.82 0.75 0.80 0.77  CALCIUM 8.7* 8.5* 8.9 8.7*  MG  --   --  2.0 2.0   Liver Function Tests: No results for input(s): "AST", "ALT", "ALKPHOS", "BILITOT", "PROT", "ALBUMIN" in the last 168 hours. No results for input(s): "LIPASE", "AMYLASE" in the last 168 hours. No results for input(s): "AMMONIA" in the last 168 hours. CBC: Recent Labs  Lab 04/29/23 1516 04/30/23 0525 05/01/23 0516 05/02/23 0617  WBC 15.2* 14.6* 13.6* 13.7*  HGB 9.7* 8.7* 9.5* 9.3*  HCT 29.4* 27.0* 28.6* 28.6*  MCV 83.1 84.9 83.9 83.4  PLT 474* 435* 455* 478*   Cardiac Enzymes: No results for input(s): "CKTOTAL", "CKMB", "CKMBINDEX", "TROPONINI" in the last 168 hours. BNP: Invalid input(s): "POCBNP" CBG: No results for input(s): "GLUCAP" in the last 168 hours. D-Dimer No results for input(s): "DDIMER" in the last 72 hours. Hgb A1c No results for input(s): "HGBA1C" in the last 72 hours. Lipid Profile No results for input(s): "CHOL", "HDL", "LDLCALC", "TRIG", "CHOLHDL", "LDLDIRECT" in the last 72 hours. Thyroid function studies No results for input(s): "TSH", "T4TOTAL", "T3FREE", "THYROIDAB" in the last 72 hours.  Invalid input(s): "FREET3" Anemia work up No results for input(s): "VITAMINB12", "FOLATE", "FERRITIN", "TIBC", "IRON",  "RETICCTPCT" in the last 72 hours. Urinalysis    Component Value Date/Time   COLORURINE DARK YELLOW 03/30/2017 1642   APPEARANCEUR CLEAR 03/30/2017 1642   APPEARANCEUR Cloudy (A) 07/21/2016 1625   LABSPEC 1.014 03/30/2017 1642   LABSPEC 1.015 08/13/2012 2249   PHURINE 6.0 03/30/2017 1642   GLUCOSEU NEGATIVE 03/30/2017 1642   GLUCOSEU Negative 08/13/2012 2249   HGBUR 2+ (A) 03/30/2017 1642   BILIRUBINUR Negative 12/14/2022 1029   BILIRUBINUR Negative 07/21/2016 1625   BILIRUBINUR Negative 08/13/2012 2249   KETONESUR NEGATIVE 03/30/2017 1642   PROTEINUR Negative 12/14/2022 1029   PROTEINUR NEGATIVE 03/30/2017 1642   UROBILINOGEN 0.2 12/14/2022 1029  NITRITE Negative 12/14/2022 1029   NITRITE NEGATIVE 09/22/2016 1626   LEUKOCYTESUR Negative 12/14/2022 1029   LEUKOCYTESUR Negative 07/21/2016 1625   LEUKOCYTESUR Negative 08/13/2012 2249   Sepsis Labs Recent Labs  Lab 04/29/23 1516 04/30/23 0525 05/01/23 0516 05/02/23 0617  WBC 15.2* 14.6* 13.6* 13.7*   Microbiology Recent Results (from the past 240 hours)  Culture, blood (routine x 2)     Status: None (Preliminary result)   Collection Time: 04/29/23  6:00 PM   Specimen: BLOOD  Result Value Ref Range Status   Specimen Description BLOOD BLOOD RIGHT HAND  Final   Special Requests   Final    BOTTLES DRAWN AEROBIC AND ANAEROBIC Blood Culture results may not be optimal due to an inadequate volume of blood received in culture bottles   Culture   Final    NO GROWTH 3 DAYS Performed at [facility], [address], [place], [place] [address]    Report Status PENDING  Incomplete  Culture, blood (routine x 2)     Status: None (Preliminary result)   Collection Time: 04/29/23  6:30 PM   Specimen: BLOOD  Result Value Ref Range Status   Specimen Description BLOOD LEFT ANTECUBITAL  Final   Special Requests   Final    BOTTLES DRAWN AEROBIC AND ANAEROBIC Blood Culture results may not be optimal due to an inadequate volume  of blood received in culture bottles   Culture   Final    NO GROWTH 3 DAYS Performed at [facility], [address], [place], [place] [address]    Report Status PENDING  Incomplete     Total time spend on discharging this patient, including the last patient exam, discussing the hospital stay, instructions for ongoing care as it relates to all pertinent caregivers, as well as preparing the medical discharge records, prescriptions, and/or referrals as applicable, is 45 minutes.    Darlin Priestly, MD  [facility] 05/02/2023, 5:15 PM

## 2023-05-02 NOTE — Progress Notes (Signed)
Pt assigned to [facility] at  bed 7. Dr.Lai  noted " the d/c order in, because pt likely will go tonight. If not, tomorrow's attending can cancel it. RN needs to message night oncall provider to update [employer] when transport is ready to come pick up pt."   Care link called RN to notify that  transportation is ordered and will call unit back to verify transportation is on the way. Pt agreed and update with new plan of care.

## 2023-05-03 ENCOUNTER — Encounter (HOSPITAL_COMMUNITY): Payer: Self-pay | Admitting: Family Medicine

## 2023-05-03 ENCOUNTER — Encounter: Payer: Medicare Other | Admitting: Student

## 2023-05-03 ENCOUNTER — Inpatient Hospital Stay (HOSPITAL_COMMUNITY): Payer: Medicare Other | Admitting: Anesthesiology

## 2023-05-03 ENCOUNTER — Other Ambulatory Visit: Payer: Self-pay

## 2023-05-03 ENCOUNTER — Encounter (HOSPITAL_COMMUNITY)
Admission: EM | Disposition: A | Discharge status: Home / Self Care | Payer: Self-pay | Source: Other Acute Inpatient Hospital | Attending: Internal Medicine

## 2023-05-03 ENCOUNTER — Other Ambulatory Visit: Payer: Self-pay | Attending: Medical Genetics

## 2023-05-03 DIAGNOSIS — N611 Abscess of the breast and nipple: Secondary | ICD-10-CM

## 2023-05-03 DIAGNOSIS — N61 Mastitis without abscess: Secondary | ICD-10-CM | POA: Diagnosis not present

## 2023-05-03 DIAGNOSIS — T8142XA Infection following a procedure, deep incisional surgical site, initial encounter: Secondary | ICD-10-CM

## 2023-05-03 DIAGNOSIS — I10 Essential (primary) hypertension: Secondary | ICD-10-CM | POA: Diagnosis not present

## 2023-05-03 HISTORY — PX: INCISION AND DRAINAGE OF WOUND: SHX1803

## 2023-05-03 LAB — CBC
HCT: 30.5 % — ABNORMAL LOW (ref 36.0–46.0)
Hemoglobin: 10 g/dL — ABNORMAL LOW (ref 12.0–15.0)
MCH: 27.3 pg (ref 26.0–34.0)
MCHC: 32.8 g/dL (ref 30.0–36.0)
MCV: 83.3 fL (ref 80.0–100.0)
Platelets: 510 10*3/uL — ABNORMAL HIGH (ref 150–400)
RBC: 3.66 MIL/uL — ABNORMAL LOW (ref 3.87–5.11)
RDW: 13.7 % (ref 11.5–15.5)
WBC: 16.2 10*3/uL — ABNORMAL HIGH (ref 4.0–10.5)
nRBC: 0.2 % (ref 0.0–0.2)

## 2023-05-03 LAB — BASIC METABOLIC PANEL
Anion gap: 11 (ref 5–15)
BUN: 7 mg/dL (ref 6–20)
CO2: 23 mmol/L (ref 22–32)
Calcium: 9.2 mg/dL (ref 8.9–10.3)
Chloride: 101 mmol/L (ref 98–111)
Creatinine, Ser: 0.96 mg/dL (ref 0.44–1.00)
GFR, Estimated: >60 mL/min (ref >60)
Glucose, Bld: 102 mg/dL — ABNORMAL HIGH (ref 70–99)
Potassium: 3.9 mmol/L (ref 3.5–5.1)
Sodium: 135 mmol/L (ref 135–145)

## 2023-05-03 SURGERY — IRRIGATION AND DEBRIDEMENT WOUND
Anesthesia: General | Site: Breast | Laterality: Left

## 2023-05-03 MED ORDER — FENTANYL CITRATE (PF) 100 MCG/2ML IJ SOLN
INTRAMUSCULAR | Status: AC
  Filled 2023-05-03: qty 2

## 2023-05-03 MED ORDER — 0.9 % SODIUM CHLORIDE (POUR BTL) OPTIME
TOPICAL | Status: DC | PRN
  Administered 2023-05-03: 3000 mL

## 2023-05-03 MED ORDER — BACITRACIN ZINC 500 UNIT/GM EX OINT
TOPICAL_OINTMENT | CUTANEOUS | Status: AC
Start: 2023-05-03 — End: ?
  Filled 2023-05-03: qty 28.35

## 2023-05-03 MED ORDER — FENTANYL CITRATE (PF) 100 MCG/2ML IJ SOLN
25.0000 ug | INTRAMUSCULAR | Status: DC | PRN
  Administered 2023-05-03 (×3): 50 ug via INTRAVENOUS

## 2023-05-03 MED ORDER — OXYCODONE HCL 5 MG/5ML PO SOLN: 5.0000 mg | Freq: Once | ORAL | Status: AC | PRN

## 2023-05-03 MED ORDER — CEFAZOLIN SODIUM-DEXTROSE 2-4 GM/100ML-% IV SOLN: 2.0000 g | INTRAVENOUS | Status: DC

## 2023-05-03 MED ORDER — VANCOMYCIN HCL 1.5 G IV SOLR
1500.0000 mg | INTRAVENOUS | Status: DC
  Administered 2023-05-03 – 2023-05-04 (×2): 1500 mg via INTRAVENOUS
  Filled 2023-05-03 (×3): qty 30

## 2023-05-03 MED ORDER — SODIUM CHLORIDE 0.9 % IV SOLN
2.0000 g | Freq: Every day | INTRAVENOUS | Status: DC
  Administered 2023-05-03 – 2023-05-04 (×2): 2 g via INTRAVENOUS
  Filled 2023-05-03 (×2): qty 20

## 2023-05-03 MED ORDER — PROPOFOL 10 MG/ML IV BOLUS
INTRAVENOUS | Status: AC
  Filled 2023-05-03: qty 20

## 2023-05-03 MED ORDER — MIDAZOLAM HCL 2 MG/2ML IJ SOLN
INTRAMUSCULAR | Status: DC | PRN
  Administered 2023-05-03: 2 mg via INTRAVENOUS

## 2023-05-03 MED ORDER — CHLORHEXIDINE GLUCONATE 4 % EX SOLN
Freq: Two times a day (BID) | CUTANEOUS | Status: DC
  Filled 2023-05-03 (×3): qty 15

## 2023-05-03 MED ORDER — BACITRACIN ZINC 500 UNIT/GM EX OINT
TOPICAL_OINTMENT | CUTANEOUS | Status: DC | PRN
  Administered 2023-05-03: 1 via TOPICAL

## 2023-05-03 MED ORDER — PROPOFOL 500 MG/50ML IV EMUL
INTRAVENOUS | Status: DC | PRN
  Administered 2023-05-03: 100 ug/kg/min via INTRAVENOUS

## 2023-05-03 MED ORDER — OXYCODONE HCL 5 MG PO TABS
5.0000 mg | ORAL_TABLET | Freq: Once | ORAL | Status: AC | PRN
  Administered 2023-05-03: 5 mg via ORAL

## 2023-05-03 MED ORDER — PROPOFOL 10 MG/ML IV BOLUS
INTRAVENOUS | Status: DC | PRN
  Administered 2023-05-03: 200 mg via INTRAVENOUS

## 2023-05-03 MED ORDER — MIDAZOLAM HCL 2 MG/2ML IJ SOLN
INTRAMUSCULAR | Status: AC
  Filled 2023-05-03: qty 2

## 2023-05-03 MED ORDER — FENTANYL CITRATE (PF) 250 MCG/5ML IJ SOLN
INTRAMUSCULAR | Status: DC | PRN
  Administered 2023-05-03: 100 ug via INTRAVENOUS
  Administered 2023-05-03: 50 ug via INTRAVENOUS
  Administered 2023-05-03: 100 ug via INTRAVENOUS

## 2023-05-03 MED ORDER — CHLORHEXIDINE GLUCONATE 0.12 % MT SOLN
15.0000 mL | OROMUCOSAL | Status: DC
  Filled 2023-05-03: qty 15

## 2023-05-03 MED ORDER — AMISULPRIDE (ANTIEMETIC) 5 MG/2ML IV SOLN: 10.0000 mg | Freq: Once | INTRAVENOUS | Status: DC | PRN

## 2023-05-03 MED ORDER — DEXMEDETOMIDINE HCL IN NACL 80 MCG/20ML IV SOLN
INTRAVENOUS | Status: DC | PRN
  Administered 2023-05-03: 8 ug via INTRAVENOUS

## 2023-05-03 MED ORDER — ORAL CARE MOUTH RINSE: 15.0000 mL | Freq: Once | OROMUCOSAL | Status: DC

## 2023-05-03 MED ORDER — SODIUM CHLORIDE 0.9 % IV SOLN: INTRAVENOUS | Status: DC

## 2023-05-03 MED ORDER — CHLORHEXIDINE GLUCONATE CLOTH 2 % EX PADS: 6.0000 | MEDICATED_PAD | Freq: Once | CUTANEOUS | Status: DC

## 2023-05-03 MED ORDER — CHLORHEXIDINE GLUCONATE 0.12 % MT SOLN: 15.0000 mL | Freq: Once | OROMUCOSAL | Status: DC

## 2023-05-03 MED ORDER — CHLORHEXIDINE GLUCONATE CLOTH 2 % EX PADS
6.0000 | MEDICATED_PAD | Freq: Once | CUTANEOUS | Status: AC
  Administered 2023-05-03: 6 via TOPICAL

## 2023-05-03 MED ORDER — FENTANYL CITRATE (PF) 250 MCG/5ML IJ SOLN
INTRAMUSCULAR | Status: AC
  Filled 2023-05-03: qty 5

## 2023-05-03 MED ORDER — OXYCODONE HCL 5 MG PO TABS
ORAL_TABLET | ORAL | Status: AC
  Filled 2023-05-03: qty 1

## 2023-05-03 SURGICAL SUPPLY — 58 items
APPLICATOR COTTON TIP 6 STRL (MISCELLANEOUS) IMPLANT
APPLICATOR COTTON TIP 6IN STRL (MISCELLANEOUS) IMPLANT
BAG COUNTER SPONGE SURGICOUNT (BAG) ×1 IMPLANT
BAG DECANTER FOR FLEXI CONT (MISCELLANEOUS) IMPLANT
BENZOIN TINCTURE PRP APPL 2/3 (GAUZE/BANDAGES/DRESSINGS) ×1 IMPLANT
BINDER BREAST XLRG (GAUZE/BANDAGES/DRESSINGS) IMPLANT
BIOPATCH RED 1 DISK 7.0 (GAUZE/BANDAGES/DRESSINGS) IMPLANT
CANISTER SUCT 3000ML PPV (MISCELLANEOUS) ×1 IMPLANT
CNTNR URN SCR LID CUP LEK RST (MISCELLANEOUS) IMPLANT
COVER SURGICAL LIGHT HANDLE (MISCELLANEOUS) ×1 IMPLANT
DRAIN CHANNEL 19F RND (DRAIN) IMPLANT
DRAIN JP 10F RND SILICONE (MISCELLANEOUS) IMPLANT
DRAPE HALF SHEET 40X57 (DRAPES) IMPLANT
DRAPE IMP U-DRAPE 54X76 (DRAPES) ×1 IMPLANT
DRAPE INCISE IOBAN 66X45 STRL (DRAPES) IMPLANT
DRAPE LAPAROSCOPIC ABDOMINAL (DRAPES) IMPLANT
DRAPE LAPAROTOMY 100X72 PEDS (DRAPES) ×1 IMPLANT
DRSG ADAPTIC 3X8 NADH LF (GAUZE/BANDAGES/DRESSINGS) IMPLANT
DRSG CALCIUM ALGINATE 4X4 (GAUZE/BANDAGES/DRESSINGS) IMPLANT
DRSG TEGADERM 4X4.75 (GAUZE/BANDAGES/DRESSINGS) IMPLANT
DRSG VAC GRANUFOAM LG (GAUZE/BANDAGES/DRESSINGS) IMPLANT
DRSG VAC GRANUFOAM MED (GAUZE/BANDAGES/DRESSINGS) IMPLANT
DRSG VAC GRANUFOAM SM (GAUZE/BANDAGES/DRESSINGS) IMPLANT
ELECT CAUTERY BLADE 6.4 (BLADE) IMPLANT
ELECT REM PT RETURN 9FT ADLT (ELECTROSURGICAL) ×1
ELECTRODE REM PT RTRN 9FT ADLT (ELECTROSURGICAL) ×1 IMPLANT
EVACUATOR SILICONE 100CC (DRAIN) IMPLANT
GAUZE PAD ABD 8X10 STRL (GAUZE/BANDAGES/DRESSINGS) IMPLANT
GAUZE SPONGE 2X2 STRL 8-PLY (GAUZE/BANDAGES/DRESSINGS) IMPLANT
GAUZE SPONGE 4X4 12PLY STRL (GAUZE/BANDAGES/DRESSINGS) IMPLANT
GLOVE BIO SURGEON STRL SZ8 (GLOVE) ×1 IMPLANT
GLOVE BIOGEL PI IND STRL 8 (GLOVE) ×1 IMPLANT
GOWN STRL REUS W/ TWL LRG LVL3 (GOWN DISPOSABLE) ×3 IMPLANT
GOWN STRL REUS W/TWL XL LVL3 (GOWN DISPOSABLE) ×1 IMPLANT
KIT BASIN OR (CUSTOM PROCEDURE TRAY) ×1 IMPLANT
KIT TURNOVER KIT B (KITS) ×1 IMPLANT
NDL HYPO 25GX1X1/2 BEV (NEEDLE) ×1 IMPLANT
NEEDLE HYPO 25GX1X1/2 BEV (NEEDLE) IMPLANT
NS IRRIG 1000ML POUR BTL (IV SOLUTION) ×1 IMPLANT
PACK GENERAL/GYN (CUSTOM PROCEDURE TRAY) ×1 IMPLANT
PACK UNIVERSAL I (CUSTOM PROCEDURE TRAY) ×1 IMPLANT
PAD ARMBOARD 7.5X6 YLW CONV (MISCELLANEOUS) ×2 IMPLANT
SPONGE T-LAP 18X18 ~~LOC~~+RFID (SPONGE) IMPLANT
STAPLER VISISTAT 35W (STAPLE) ×1 IMPLANT
SURGILUBE 2OZ TUBE FLIPTOP (MISCELLANEOUS) IMPLANT
SUT MNCRL AB 3-0 PS2 27 (SUTURE) IMPLANT
SUT MNCRL AB 4-0 PS2 18 (SUTURE) IMPLANT
SUT MON AB 2-0 CT1 36 (SUTURE) IMPLANT
SUT MON AB 5-0 PS2 18 (SUTURE) IMPLANT
SUT PROLENE 4 0 PS 2 18 (SUTURE) IMPLANT
SUT SILK 2 0 SH (SUTURE) IMPLANT
SUT VIC AB 5-0 PS2 18 (SUTURE) IMPLANT
SUT VICRYL 3 0 (SUTURE) IMPLANT
SWAB COLLECTION DEVICE MRSA (MISCELLANEOUS) IMPLANT
SWAB CULTURE ESWAB REG 1ML (MISCELLANEOUS) IMPLANT
SYR CONTROL 10ML LL (SYRINGE) ×1 IMPLANT
TOWEL GREEN STERILE (TOWEL DISPOSABLE) ×1 IMPLANT
UNDERPAD 30X36 HEAVY ABSORB (UNDERPADS AND DIAPERS) ×1 IMPLANT

## 2023-05-03 NOTE — Interval H&P Note (Signed)
History and Physical Interval Note: No change in exam or indication for surgery Surgical site marked. All questions answered Will proceed with washout of the left breast at her request.  05/03/2023 3:10 PM  Catherine Berg  has presented today for surgery, with the diagnosis of left breast infection.  The various methods of treatment have been discussed with the patient and family. After consideration of risks, benefits and other options for treatment, the patient has consented to  Procedure(s): IRRIGATION AND DEBRIDEMENT WOUND (Left) as a surgical intervention.  The patient's history has been reviewed, patient examined, no change in status, stable for surgery.  I have reviewed the patient's chart and labs.  Questions were answered to the patient's satisfaction.     [name]

## 2023-05-03 NOTE — Assessment & Plan Note (Addendum)
05-04-2023 stable. On cymbalta 60 mg daily, buspar 5 mg bid.

## 2023-05-03 NOTE — Assessment & Plan Note (Addendum)
05-04-2023 stable on synthroid 100 mcg daily.

## 2023-05-03 NOTE — Hospital Course (Signed)
HPI: Catherine Berg is a 42 y.o. female with medical history significant for hypertension, hypothyroidism, asthma, depression, anxiety, BMI 42, and symptomatic macromastia s/p bilateral breast reduction surgery on 04/13/2023 who presents with concerns for infection of the left breast.     [facility] ED & Hospital Course: Patient presented to the ED on 04/29/2023 with severe left breast pain, redness, and heat.  She also reported a fever to 101F at home.  She had blood cultures drawn which have been negative to date.  CT in the ED demonstrated loosely organized fluid collections in the bilateral breasts.   She was admitted to the hospitalist service on 04/29/2023, started on Rocephin and vancomycin, but failed to show any clinical improvement, developed malodorous purulent drainage, and plastic surgeon (Dr. Ladona Ridgel) recommended transfer to [facility] for likely operative management.    Significant Events: Admitted 05/02/2023 as a transfer from [facility] due to post-op infection of her left breast surgery   Significant Labs:   Significant Imaging Studies:   Antibiotic Therapy: Anti-infectives (From admission, onward)    Start     Dose/Rate Route Frequency Ordered Stop   05/03/23 1300  vancomycin (VANCOREADY) IVPB 1750 mg/350 mL  Status:  Discontinued        1,750 mg 175 mL/hr over 120 Minutes Intravenous Every 24 hours 05/02/23 2211 05/03/23 1026   05/03/23 1300  Vancomycin (VANCOCIN) 1,500 mg in sodium chloride 0.9 % 500 mL IVPB        1,500 mg 250 mL/hr over 120 Minutes Intravenous Every 24 hours 05/03/23 1026 05/06/23 1259   05/03/23 1145  ceFAZolin (ANCEF) IVPB 2g/100 mL premix        2 g 200 mL/hr over 30 Minutes Intravenous On call to O.R. 05/03/23 1057 05/04/23 0559   05/03/23 0900  cefTRIAXone (ROCEPHIN) 2 g in sodium chloride 0.9 % 100 mL IVPB        2 g 200 mL/hr over 30 Minutes Intravenous Daily 05/03/23 0814         Procedures:   Consultants: Plastic Surgery

## 2023-05-03 NOTE — Anesthesia Procedure Notes (Signed)
Procedure Name: LMA Insertion Date/Time: 05/03/2023 3:38 PM  Performed by: Hali Marry, CRNAPre-anesthesia Checklist: Patient identified, Emergency Drugs available, Suction available and Patient being monitored Patient Re-evaluated:Patient Re-evaluated prior to induction Oxygen Delivery Method: Circle system utilized Preoxygenation: Pre-oxygenation with 100% oxygen Induction Type: IV induction Ventilation: Mask ventilation without difficulty LMA: LMA flexible inserted LMA Size: 4.0 Number of attempts: 1 Placement Confirmation: ETT inserted through vocal cords under direct vision, positive ETCO2 and breath sounds checked- equal and bilateral Tube secured with: Tape Dental Injury: Teeth and Oropharynx as per pre-operative assessment

## 2023-05-03 NOTE — Transfer of Care (Signed)
Immediate Anesthesia Transfer of Care Note  Patient: Catherine Berg  Procedure(s) Performed: IRRIGATION AND DEBRIDEMENT WOUND (Left: Breast)  Patient Location: PACU  Anesthesia Type:General  Level of Consciousness: awake, alert , and oriented  Airway & Oxygen Therapy: Patient Spontanous Breathing  Post-op Assessment: Report given to RN and Post -op Vital signs reviewed and stable  Post vital signs: Reviewed and stable  Last Vitals:  Vitals Value Taken Time  BP    Temp    Pulse 95 05/03/23 1649  Resp 17 05/03/23 1649  SpO2 97 % 05/03/23 1649  Vitals shown include unfiled device data.  Last Pain:  Vitals:   05/03/23 1426  TempSrc:   PainSc: 8       Patients Stated Pain Goal: 1 (05/03/23 1426)  Complications: No notable events documented.

## 2023-05-03 NOTE — Subjective & Objective (Addendum)
Pt seen and examined. Pt had washout of left breast abscess yesterday. C/o of left breast pain. Discussed with Dr. Ladona Ridgel with plastic surgery. Ok to start lovenox for DVT prophylaxis.

## 2023-05-03 NOTE — Anesthesia Preprocedure Evaluation (Addendum)
Anesthesia Evaluation  Patient identified by MRN, date of birth, ID band Patient awake    Reviewed: Allergy & Precautions, H&P , NPO status , Patient's Chart, lab work & pertinent test results, reviewed documented beta blocker date and time   History of Anesthesia Complications (+) PONV and history of anesthetic complications  Airway Mallampati: II  TM Distance: >3 FB Neck ROM: full   Comment: Previous grade I view with MAC 3, easy mask Dental  (+) Dental Advisory Given, Edentulous Lower, Poor Dentition   Pulmonary asthma    Pulmonary exam normal        Cardiovascular hypertension, Pt. on medications + CAD and + Orthopnea  Normal cardiovascular exam Rate:Normal     Neuro/Psych  PSYCHIATRIC DISORDERS Anxiety Depression     Neuromuscular disease    GI/Hepatic Neg liver ROS,GERD  Medicated,,  Endo/Other  Hypothyroidism  Class 3 obesity  Renal/GU negative Renal ROS  negative genitourinary   Musculoskeletal negative musculoskeletal ROS (+)    Abdominal  (+) + obese  Peds  Hematology negative hematology ROS (+) Blood dyscrasia, anemia Lab Results      Component                Value               Date                      WBC                      16.2 (H)            05/03/2023                HGB                      10.0 (L)            05/03/2023                HCT                      30.5 (L)            05/03/2023                MCV                      83.3                05/03/2023                PLT                      510 (H)             05/03/2023              Anesthesia Other Findings 42 y.o. female with medical history significant for hypertension, hypothyroidism, asthma, depression, anxiety, BMI 42, and symptomatic macromastia s/p bilateral breast reduction surgery on 04/13/2023 who presents with concerns for infection of the left breast.    Reproductive/Obstetrics                              Anesthesia Physical Anesthesia Plan  ASA: 3  Anesthesia Plan: General   Post-op Pain Management: Tylenol PO (pre-op)* and Celebrex PO (pre-op)*  Induction: Intravenous  PONV Risk Score and Plan: 4 or greater and Ondansetron, Dexamethasone, Midazolam and Treatment may vary due to age or medical condition  Airway Management Planned: LMA  Additional Equipment: None  Intra-op Plan:   Post-operative Plan: Extubation in OR  Informed Consent: I have reviewed the patients History and Physical, chart, labs and discussed the procedure including the risks, benefits and alternatives for the proposed anesthesia with the patient or authorized representative who has indicated his/her understanding and acceptance.     Dental advisory given  Plan Discussed with: CRNA and Anesthesiologist  Anesthesia Plan Comments: (Risks of general anesthesia discussed including, but not limited to, sore throat, hoarse voice, chipped/damaged teeth, injury to vocal cords, nausea and vomiting, allergic reactions, lung infection, heart attack, stroke, and death. All questions answered. )        Anesthesia Quick Evaluation

## 2023-05-03 NOTE — Anesthesia Postprocedure Evaluation (Signed)
Anesthesia Post Note  Patient: [name]  Procedure(s) Performed: IRRIGATION AND DEBRIDEMENT WOUND (Left: Breast)     Patient location during evaluation: PACU Anesthesia Type: General Level of consciousness: awake and alert Pain management: pain level controlled Vital Signs Assessment: post-procedure vital signs reviewed and stable Respiratory status: spontaneous breathing, nonlabored ventilation and respiratory function stable Cardiovascular status: blood pressure returned to baseline and stable Postop Assessment: no apparent nausea or vomiting Anesthetic complications: no  No notable events documented.  Last Vitals:  Vitals:   05/03/23 1715 05/03/23 1730  BP: 125/76 129/77  Pulse: 95 98  Resp: 14 16  Temp:  36.6 C  SpO2: 93% 96%    Last Pain:  Vitals:   05/03/23 1715  TempSrc:   PainSc: Asleep                 Ioane Bhola,W. EDMOND

## 2023-05-03 NOTE — Progress Notes (Signed)
Pharmacy Antibiotic Note  Catherine Berg is a 42 y.o. female admitted on 05/02/2023 with cellulitis.  Pharmacy has been consulted for vancomycin dosing. Patient was started on vancomycin on 12/26 at 1250mg  q12h, last dose 1250 mg was given @1251  today. Mild increase in Scr this morning, ordered was vancomycin 1750mg  q24h eAUC 586.   Plan: Vancomycin 1500 mg IV every 24 hours. (estAUC 502, SCr used 0.96, Vd 0.5) x 3 more days Monitor clinical progress, cultures/sensitivities, renal function, abx plan Vancomycin levels as indicated Plan for OR 12/30 CFTX per MD  Height: 5\' 3"  (160 cm) Weight: 106 kg (233 lb 11 oz) IBW/kg (Calculated) : 52.4  Temp (24hrs), Avg:99.1 F (37.3 C), Min:98.6 F (37 C), Max:99.6 F (37.6 C)  Recent Labs  Lab 04/29/23 1516 04/29/23 1800 04/30/23 0525 05/01/23 0516 05/02/23 0617 05/03/23 0556  WBC 15.2*  --  14.6* 13.6* 13.7* 16.2*  CREATININE 0.82  --  0.75 0.80 0.77 0.96  LATICACIDVEN  --  0.7  --   --   --   --     Estimated Creatinine Clearance: 88 mL/min (by C-G formula based on SCr of 0.96 mg/dL).    Allergies  Allergen Reactions   Latex Hives    (Balloons, condoms, underwear elastic, gloves)   Shellfish Allergy Hives    Antimicrobials this admission: 12/26 vanc >> plan 7 days 12/26 ceftriaxone >> 12/29  Dose adjustments this admission: 12/27: dose adjusted from 1750 mg q24h to 1250 mg q12h  12/29: adjusting dose to 1750 mg q 24 hours  Microbiology results: 12/26 BCx: ng x 3d   Thank you for allowing pharmacy to be a part of this patient's care.   Estill Batten, PharmD, BCCCP  05/03/2023 10:26 AM  **Pharmacist phone directory can be found on [email or web address] listed under [facility]**

## 2023-05-03 NOTE — Assessment & Plan Note (Addendum)
05-04-2023 continue with prn albuterol nebs.

## 2023-05-03 NOTE — Assessment & Plan Note (Addendum)
05-03-2023 continue with IV rocephin/Vanco. Pt to go to OR today for washout/I&D by plastic surgery Dr. Ladona Ridgel. 05-04-2023 s/p abscess washout yesterday. Plastic surgery to manage pt's opiate medications. Remains on IV rocephin/vanco.

## 2023-05-03 NOTE — Op Note (Signed)
DATE OF OPERATION: 05/03/2023  LOCATION: [facility] Main operating Room  PREOPERATIVE DIAGNOSIS: Left breast abscess  POSTOPERATIVE DIAGNOSIS: Same  PROCEDURE: Irrigation and drainage of abscess  SURGEON: Loren Racer, MD  ASSISTANT: Caroline More  EBL: 25 cc  CONDITION: Stable  COMPLICATIONS: None  INDICATION: The patient, Catherine Berg, is a 42 y.o. female born on February 07, 1981, is here for treatment of an infection of the left breast after bilateral breast reduction.   PROCEDURE DETAILS:  The patient was seen prior to surgery and marked.  IV antibiotics were given. The patient was taken to the operating room and given a general anesthetic. A standard time out was performed and all information was confirmed by those in the room. SCDs were placed.   The left breast was prepped and draped in usual sterile manner.  The patient had purulent drainage coming from the incision prior to opening the breast.  The previous incision was opened and the purulent fluid removed.  Cultures were obtained.  There was approximately 20 mL in total of purulent fluid.  The wound was irrigated with approximately 1 L of saline.  A 19 French round drain was placed in the wound and brought out through a separate stab incision.  The dermis was reapproximated with interrupted 3-0 Monocryl's and the skin was closed with a running 4-0 Prolene suture.  Patient tolerated procedure well there were no complications.  All instrument needle and sponge counts were reported as correct.  Patient was transferred to the recovery room in good condition. The patient was allowed to wake up and taken to recovery room in stable condition at the end of the case. The family was notified at the end of the case.   The advanced practice practitioner (APP) assisted throughout the case.  The APP was essential in retraction and counter traction when needed to make the case progress smoothly.  This retraction and assistance made it possible to see the  tissue plans for the procedure.  The assistance was needed for blood control, tissue re-approximation and assisted with closure of the incision site.

## 2023-05-03 NOTE — Assessment & Plan Note (Addendum)
05-04-2023 stable on 5 mg norvasc

## 2023-05-03 NOTE — Assessment & Plan Note (Signed)
BMI-41.4

## 2023-05-03 NOTE — Progress Notes (Signed)
PROGRESS NOTE    Catherine Berg  XBJ:[id number] DOB: 1980/05/23 DOA: 05/02/2023 PCP: [name], MD  Subjective: Pt seen and examined. I was able to message with her plastic surgeon Dr. Ladona Ridgel who confirms he is taking pt to OR today for washout of her wound and drainage of any abscess.   Pt remains on IV Rocephin and IV Vanco.   Hospital Course: HPI: Catherine Berg is a 42 y.o. female with medical history significant for hypertension, hypothyroidism, asthma, depression, anxiety, BMI 42, and symptomatic macromastia s/p bilateral breast reduction surgery on 04/13/2023 who presents with concerns for infection of the left breast.     [facility] ED & Hospital Course: Patient presented to the ED on 04/29/2023 with severe left breast pain, redness, and heat.  She also reported a fever to 101F at home.  She had blood cultures drawn which have been negative to date.  CT in the ED demonstrated loosely organized fluid collections in the bilateral breasts.   She was admitted to the hospitalist service on 04/29/2023, started on Rocephin and vancomycin, but failed to show any clinical improvement, developed malodorous purulent drainage, and plastic surgeon (Dr. Ladona Ridgel) recommended transfer to [facility] for likely operative management.    Significant Events: Admitted 05/02/2023 as a transfer from [facility] due to post-op infection of her left breast surgery   Significant Labs:   Significant Imaging Studies:   Antibiotic Therapy: Anti-infectives (From admission, onward)    Start     Dose/Rate Route Frequency Ordered Stop   05/03/23 1300  vancomycin (VANCOREADY) IVPB 1750 mg/350 mL  Status:  Discontinued        1,750 mg 175 mL/hr over 120 Minutes Intravenous Every 24 hours 05/02/23 2211 05/03/23 1026   05/03/23 1300  Vancomycin (VANCOCIN) 1,500 mg in sodium chloride 0.9 % 500 mL IVPB        1,500 mg 250 mL/hr over 120 Minutes Intravenous Every 24 hours 05/03/23 1026 05/06/23 1259    05/03/23 1145  ceFAZolin (ANCEF) IVPB 2g/100 mL premix        2 g 200 mL/hr over 30 Minutes Intravenous On call to O.R. 05/03/23 1057 05/04/23 0559   05/03/23 0900  cefTRIAXone (ROCEPHIN) 2 g in sodium chloride 0.9 % 100 mL IVPB        2 g 200 mL/hr over 30 Minutes Intravenous Daily 05/03/23 0814         Procedures:   Consultants: Plastic Surgery    Assessment and Plan: * Post op infection of left breast 05-03-2023 continue with IV rocephin/Vanco. Pt to go to OR today for washout/I&D by plastic surgery Dr. Ladona Ridgel.  Cellulitis of left breast 05-03-2023 continue with IV rocephin/Vanco. Pt to go to OR today for washout/I&D by plastic surgery Dr. Ladona Ridgel.  Depression with anxiety 05-03-2023 stable. On cymbalta 60 mg daily, buspar 5 mg bid.  Hypothyroidism 05-03-2023 stable on synthroid 100 mcg daily.  Essential hypertension 05-03-2023 stable on 5 mg norvasc  Obesity, Class III, BMI 40-49.9 (morbid obesity) (HCC) BMI 41.4  Allergy-induced asthma, mild intermittent, uncomplicated 05-03-2023 continue with prn albuterol nebs.       DVT prophylaxis: SCD's Start: 05/03/23 1058 SCDs Start: 05/02/23 2134     Code Status: Full Code Family Communication: no family at bedside Disposition Plan: return home Reason for continuing need for hospitalization: remains on IV ABX, awaiting surgery today.  Objective: Vitals:   05/02/23 2239 05/03/23 0612 05/03/23 0916  BP: (!) 159/82 120/88 117/74  Pulse: 100 Marland Kitchen)  108 (!) 103  Resp: 19 20 18   Temp: 99 F (37.2 C) 99.6 F (37.6 C) 99.2 F (37.3 C)  TempSrc: Oral Oral Oral  SpO2: 99% 100% 98%  Weight: 106 kg    Height: 5\' 3"  (1.6 m)      Intake/Output Summary (Last 24 hours) at 05/03/2023 1255 Last data filed at 05/02/2023 2206 Gross per 24 hour  Intake 240 ml  Output --  Net 240 ml   Filed Weights   05/02/23 2239  Weight: 106 kg    Examination:  Physical Exam Vitals and nursing note reviewed.  Constitutional:       General: She is not in acute distress.    Appearance: She is obese. She is not toxic-appearing or diaphoretic.  HENT:     Head: Normocephalic and atraumatic.     Nose: Nose normal.  Eyes:     General: No scleral icterus. Cardiovascular:     Rate and Rhythm: Normal rate.     Pulses: Normal pulses.  Pulmonary:     Effort: Pulmonary effort is normal.     Breath sounds: Normal breath sounds.  Abdominal:     General: Bowel sounds are normal. There is no distension.     Palpations: Abdomen is soft.     Tenderness: There is no abdominal tenderness.  Musculoskeletal:     Comments: Pt deferred breast exam due to pain.  Skin:    Capillary Refill: Capillary refill takes less than 2 seconds.  Neurological:     General: No focal deficit present.     Mental Status: She is alert and oriented to person, place, and time.     Data Reviewed: I have personally reviewed following labs and imaging studies  CBC: Recent Labs  Lab 04/29/23 1516 04/30/23 0525 05/01/23 0516 05/02/23 0617 05/03/23 0556  WBC 15.2* 14.6* 13.6* 13.7* 16.2*  HGB 9.7* 8.7* 9.5* 9.3* 10.0*  HCT 29.4* 27.0* 28.6* 28.6* 30.5*  MCV 83.1 84.9 83.9 83.4 83.3  PLT 474* 435* 455* 478* 510*   Basic Metabolic Panel: Recent Labs  Lab 04/29/23 1516 04/30/23 0525 05/01/23 0516 05/02/23 0617 05/03/23 0556  NA 133* 134* 133* 131* 135  K 3.7 3.9 4.0 3.8 3.9  CL 103 105 103 101 101  CO2 19* 19* 23 21* 23  GLUCOSE 141* 116* 113* 113* 102*  BUN 11 9 10 9 7   CREATININE 0.82 0.75 0.80 0.77 0.96  CALCIUM 8.7* 8.5* 8.9 8.7* 9.2  MG  --   --  2.0 2.0  --    GFR: Estimated Creatinine Clearance: 88 mL/min (by C-G formula based on SCr of 0.96 mg/dL). Sepsis Labs: Recent Labs  Lab 04/29/23 1800 05/01/23 0516  PROCALCITON  --  <0.10  LATICACIDVEN 0.7  --     Recent Results (from the past 240 hours)  Culture, blood (routine x 2)     Status: None (Preliminary result)   Collection Time: 04/29/23  6:00 PM   Specimen:  BLOOD  Result Value Ref Range Status   Specimen Description BLOOD BLOOD RIGHT HAND  Final   Special Requests   Final    BOTTLES DRAWN AEROBIC AND ANAEROBIC Blood Culture results may not be optimal due to an inadequate volume of blood received in culture bottles   Culture   Final    NO GROWTH 4 DAYS Performed at [facility], [address] [place], [place] [address]    Report Status PENDING  Incomplete  Culture, blood (routine x 2)  Status: None (Preliminary result)   Collection Time: 04/29/23  6:30 PM   Specimen: BLOOD  Result Value Ref Range Status   Specimen Description BLOOD LEFT ANTECUBITAL  Final   Special Requests   Final    BOTTLES DRAWN AEROBIC AND ANAEROBIC Blood Culture results may not be optimal due to an inadequate volume of blood received in culture bottles   Culture   Final    NO GROWTH 4 DAYS Performed at [facility], [address] [place], [place] [address]    Report Status PENDING  Incomplete     Radiology Studies: No results found.  Scheduled Meds:  amLODipine  5 mg Oral Daily   busPIRone  5 mg Oral BID   Chlorhexidine Gluconate Cloth  6 each Topical Once   DULoxetine  60 mg Oral Daily   levothyroxine  100 mcg Oral QAC breakfast   topiramate  100 mg Oral QHS   Continuous Infusions:   ceFAZolin (ANCEF) IV     cefTRIAXone (ROCEPHIN)  IV 2 g (05/03/23 1038)   vancomycin       LOS: 1 day   Time spent: 40 minutes  Carollee Herter, DO  [facility]  05/03/2023, 12:55 PM

## 2023-05-04 ENCOUNTER — Ambulatory Visit (INDEPENDENT_AMBULATORY_CARE_PROVIDER_SITE_OTHER): Payer: Medicare Other | Admitting: Licensed Clinical Social Worker

## 2023-05-04 ENCOUNTER — Encounter (HOSPITAL_COMMUNITY): Payer: Self-pay | Admitting: Plastic Surgery

## 2023-05-04 DIAGNOSIS — E871 Hypo-osmolality and hyponatremia: Secondary | ICD-10-CM | POA: Insufficient documentation

## 2023-05-04 DIAGNOSIS — T8142XA Infection following a procedure, deep incisional surgical site, initial encounter: Secondary | ICD-10-CM | POA: Diagnosis not present

## 2023-05-04 DIAGNOSIS — N61 Mastitis without abscess: Secondary | ICD-10-CM | POA: Diagnosis not present

## 2023-05-04 DIAGNOSIS — Z91199 Patient's noncompliance with other medical treatment and regimen due to unspecified reason: Secondary | ICD-10-CM

## 2023-05-04 DIAGNOSIS — I10 Essential (primary) hypertension: Secondary | ICD-10-CM | POA: Diagnosis not present

## 2023-05-04 LAB — CBC
HCT: 29.3 % — ABNORMAL LOW (ref 36.0–46.0)
Hemoglobin: 9.5 g/dL — ABNORMAL LOW (ref 12.0–15.0)
MCH: 27.3 pg (ref 26.0–34.0)
MCHC: 32.4 g/dL (ref 30.0–36.0)
MCV: 84.2 fL (ref 80.0–100.0)
Platelets: 495 10*3/uL — ABNORMAL HIGH (ref 150–400)
RBC: 3.48 MIL/uL — ABNORMAL LOW (ref 3.87–5.11)
RDW: 14 % (ref 11.5–15.5)
WBC: 14.8 10*3/uL — ABNORMAL HIGH (ref 4.0–10.5)
nRBC: 0.2 % (ref 0.0–0.2)

## 2023-05-04 LAB — BASIC METABOLIC PANEL
Anion gap: 9 (ref 5–15)
BUN: 9 mg/dL (ref 6–20)
CO2: 20 mmol/L — ABNORMAL LOW (ref 22–32)
Calcium: 8.7 mg/dL — ABNORMAL LOW (ref 8.9–10.3)
Chloride: 103 mmol/L (ref 98–111)
Creatinine, Ser: 0.72 mg/dL (ref 0.44–1.00)
GFR, Estimated: >60 mL/min (ref >60)
Glucose, Bld: 141 mg/dL — ABNORMAL HIGH (ref 70–99)
Potassium: 3.7 mmol/L (ref 3.5–5.1)
Sodium: 132 mmol/L — ABNORMAL LOW (ref 135–145)

## 2023-05-04 LAB — CULTURE, BLOOD (ROUTINE X 2)
Culture: NO GROWTH
Culture: NO GROWTH

## 2023-05-04 LAB — PROCALCITONIN: Procalcitonin: 0.21 ng/mL

## 2023-05-04 MED ORDER — VANCOMYCIN HCL 1.5 G IV SOLR
1500.0000 mg | INTRAVENOUS | Status: DC
  Administered 2023-05-05 – 2023-05-06 (×2): 1500 mg via INTRAVENOUS
  Filled 2023-05-04 (×3): qty 30

## 2023-05-04 MED ORDER — SODIUM CHLORIDE 0.9 % IV SOLN
500.0000 mg | Freq: Three times a day (TID) | INTRAVENOUS | Status: DC
  Filled 2023-05-04 (×3): qty 10

## 2023-05-04 MED ORDER — IBUPROFEN 400 MG PO TABS
400.0000 mg | ORAL_TABLET | Freq: Four times a day (QID) | ORAL | Status: DC | PRN
  Administered 2023-05-04 – 2023-05-09 (×7): 400 mg via ORAL
  Filled 2023-05-04 (×8): qty 1

## 2023-05-04 MED ORDER — MORPHINE SULFATE (PF) 2 MG/ML IV SOLN
1.0000 mg | INTRAVENOUS | Status: DC | PRN
  Administered 2023-05-04 – 2023-05-06 (×6): 1 mg via INTRAVENOUS
  Filled 2023-05-04 (×6): qty 1

## 2023-05-04 MED ORDER — ENOXAPARIN SODIUM 60 MG/0.6ML IJ SOSY
50.0000 mg | PREFILLED_SYRINGE | INTRAMUSCULAR | Status: DC
  Administered 2023-05-04 – 2023-05-06 (×3): 50 mg via SUBCUTANEOUS
  Filled 2023-05-04 (×3): qty 0.6

## 2023-05-04 MED ORDER — SODIUM CHLORIDE 0.9 % IV SOLN
2.0000 g | Freq: Three times a day (TID) | INTRAVENOUS | Status: DC
  Administered 2023-05-04 – 2023-05-06 (×4): 2 g via INTRAVENOUS
  Filled 2023-05-04 (×7): qty 40

## 2023-05-04 MED ORDER — ORAL CARE MOUTH RINSE: 15.0000 mL | OROMUCOSAL | Status: DC | PRN

## 2023-05-04 NOTE — Plan of Care (Signed)
  Problem: Education: Goal: Knowledge of General Education information will improve Description: Including pain rating scale, medication(s)/side effects and non-pharmacologic comfort measures 05/04/2023 0628 by Nyle Darice BRAVO, LPN Outcome: Progressing 05/04/2023 0504 by Nyle Darice BRAVO, LPN Outcome: Progressing   Problem: Activity: Goal: Risk for activity intolerance will decrease Outcome: Progressing   Problem: Nutrition: Goal: Adequate nutrition will be maintained 05/04/2023 0628 by Nyle Darice BRAVO, LPN Outcome: Progressing 05/04/2023 0504 by Nyle Darice BRAVO, LPN Outcome: Progressing   Problem: Coping: Goal: Level of anxiety will decrease Outcome: Progressing   Problem: Elimination: Goal: Will not experience complications related to bowel motility Outcome: Progressing   Problem: Pain Management: Goal: General experience of comfort will improve 05/04/2023 0628 by Nyle Darice BRAVO, LPN Outcome: Progressing 05/04/2023 0504 by Nyle Darice BRAVO, LPN Outcome: Progressing   Problem: Safety: Goal: Ability to remain free from injury will improve 05/04/2023 0628 by Nyle Darice BRAVO, LPN Outcome: Progressing 05/04/2023 0504 by Nyle Darice BRAVO, LPN Outcome: Progressing   Problem: Skin Integrity: Goal: Risk for impaired skin integrity will decrease 05/04/2023 0628 by Nyle Darice BRAVO, LPN Outcome: Progressing 05/04/2023 0504 by Nyle Darice BRAVO, LPN Outcome: Progressing

## 2023-05-04 NOTE — TOC Initial Note (Signed)
 Transition of Care [facility]) - Initial/Assessment Note    Patient Details  Name: Catherine Berg MRN: [id number] Date of Birth: 1981-02-24  Transition of Care [facility]) CM/SW Contact:    Stephane Karolee Jansky, RN Phone Number: 05/04/2023, 10:41 AM  Clinical Narrative:                 Spoke to patient at bedside. Confined face sheet information.   Patient from home with children ages 28, 70 and 59 years old.   Has PCP .   Patient has had JP drain in past and is comfortable emptying and recording drainage .      Transition of Care Department [facility]) has reviewed patient and no TOC needs have been identified at this time. We will continue to monitor patient advancement through interdisciplinary progression rounds. If new patient transition needs arise, please place a TOC consult.     Expected Discharge Plan: Home/Self Care Barriers to Discharge: Continued Medical Work up   Patient Goals and CMS Choice Patient states their goals for this hospitalization and ongoing recovery are:: to return to home          Expected Discharge Plan and Services   Discharge Planning Services: CM Consult   Living arrangements for the past 2 months: Single Family Home                 DME Arranged: N/A         HH Arranged: NA          Prior Living Arrangements/Services Living arrangements for the past 2 months: Single Family Home Lives with:: Self (children ages 27, 40 and 49 years old) Patient language and need for interpreter reviewed:: Yes Do you feel safe going back to the place where you live?: Yes      Need for Family Participation in Patient Care: Yes (Comment) Care giver support system in place?: Yes (comment)   Criminal Activity/Legal Involvement Pertinent to Current Situation/Hospitalization: No - Comment as needed  Activities of Daily Living   ADL Screening (condition at time of admission) Independently performs ADLs?: Yes (appropriate for developmental age) Is the patient deaf  or have difficulty hearing?: No Does the patient have difficulty seeing, even when wearing glasses/contacts?: Yes Does the patient have difficulty concentrating, remembering, or making decisions?: No  Permission Sought/Granted   Permission granted to share information with : No              Emotional Assessment Appearance:: Appears stated age Attitude/Demeanor/Rapport: Engaged Affect (typically observed): Accepting Orientation: : Oriented to Self, Oriented to Place, Oriented to  Time, Oriented to Situation Alcohol / Substance Use: Not Applicable Psych Involvement: No (comment)  Admission diagnosis:  Cellulitis of left breast [N61.0] Patient Active Problem List   Diagnosis Date Noted   Cellulitis of left breast 05/02/2023   Post op infection of left breast 04/29/2023   Sepsis (HCC) 04/29/2023   Essential hypertension 04/29/2023   Hypothyroidism 04/29/2023   Depression with anxiety 04/29/2023   Strain of insertion of tendon of hamstring muscle 09/07/2022   Asthma 05/12/2022   MDD (major depressive disorder), recurrent episode, mild (HCC) 11/17/2021   Neuropathy involving both lower extremities 11/17/2021   Perennial allergic rhinitis with seasonal variation 11/17/2021   Chronic pain of both lower extremities 09/24/2020   Sleeps in sitting position due to orthopnea 07/02/2020   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 ([address]), [place] - [address] [place]) [place] [address] Phone: [phone] Fax: [phone]     Social Drivers of Health (SDOH) Social History: SDOH Screenings   Food Insecurity: No Food Insecurity (05/02/2023)  Housing: Low Risk  (05/02/2023)  Transportation Needs: No Transportation Needs (05/02/2023)  Utilities: Not At Risk (05/02/2023)  Alcohol Screen: Low Risk  (02/25/2023)  Depression (PHQ2-9): Medium Risk (03/17/2023)  Financial Resource Strain: Medium Risk (06/17/2022)  Physical Activity: Inactive (06/17/2022)  Social Connections: Moderately Isolated (06/17/2022)  Stress: Stress Concern Present (06/17/2022)  Tobacco Use: Low Risk  (05/03/2023)   SDOH Interventions:     Readmission Risk Interventions     No data to display

## 2023-05-04 NOTE — Progress Notes (Signed)
Pt is in the hospital and unable to attend her appointment.

## 2023-05-04 NOTE — Progress Notes (Signed)
 Pharmacy Antibiotic Note  Catherine Berg is a 42 y.o. female admitted on 05/02/2023 with  wound infection .  Pharmacy has been consulted for meropenem  dosing.  Today is d#6 of antibiotics with no improvement in wound infection.  Wound cx GNR.  Broadening coverage to meropenem .   Plan: Meropenem  2gm IV q8h  Height: 5' 3 (160 cm) Weight: 105.7 kg (233 lb) IBW/kg (Calculated) : 52.4  Temp (24hrs), Avg:98.5 F (36.9 C), Min:97.8 F (36.6 C), Max:98.7 F (37.1 C)  Recent Labs  Lab 04/29/23 1800 04/30/23 0525 05/01/23 0516 05/02/23 0617 05/03/23 0556 05/04/23 0318  WBC  --  14.6* 13.6* 13.7* 16.2* 14.8*  CREATININE  --  0.75 0.80 0.77 0.96 0.72  LATICACIDVEN 0.7  --   --   --   --   --     Estimated Creatinine Clearance: 105.5 mL/min (by C-G formula based on SCr of 0.72 mg/dL).    Allergies  Allergen Reactions   Latex Hives    (Balloons, condoms, underwear elastic, gloves)   Shellfish Allergy Hives    Antimicrobials this admission: Rocephin   12/27 >> 12/31 Meropenem  12/31 >> Vanc 12/26 >>   Dose adjustments this admission:   Microbiology results: 12/26 BCx: negative 12/30 L breast tissue: GNR  Thank you for allowing pharmacy to be a part of this patient's care.  [employer], Pharm.D., BCPS Clinical Pharmacist  **Pharmacist phone directory can be found on [email or web address] listed under [facility].  05/04/2023 4:56 PM

## 2023-05-04 NOTE — Progress Notes (Signed)
 PROGRESS NOTE    Catherine Berg  FMW:[id number] DOB: [birth year] DOA: 05/02/2023 PCP: Sowles, Krichna, MD  Subjective: Pt seen and examined. Pt had washout of left breast abscess yesterday. C/o of left breast pain. Discussed with Dr. Waddell with plastic surgery. Ok to start lovenox  for DVT prophylaxis.   Hospital Course: HPI: Catherine Berg is a 42 y.o. female with medical history significant for hypertension, hypothyroidism, asthma, depression, anxiety, BMI 42, and symptomatic macromastia s/p bilateral breast reduction surgery on 04/13/2023 who presents with concerns for infection of the left breast.     [facility] ED & Hospital Course: Patient presented to the ED on 04/29/2023 with severe left breast pain, redness, and heat.  She also reported a fever to 101F at home.  She had blood cultures drawn which have been negative to date.  CT in the ED demonstrated loosely organized fluid collections in the bilateral breasts.   She was admitted to the hospitalist service on 04/29/2023, started on Rocephin  and vancomycin , but failed to show any clinical improvement, developed malodorous purulent drainage, and plastic surgeon (Dr. Waddell) recommended transfer to [facility] for likely operative management.    Significant Events: Admitted 05/02/2023 as a transfer from [facility] due to post-op infection of her left breast surgery   Significant Labs:   Significant Imaging Studies:   Antibiotic Therapy: Anti-infectives (From admission, onward)    Start     Dose/Rate Route Frequency Ordered Stop   05/03/23 1300  vancomycin  (VANCOREADY) IVPB 1750 mg/350 mL  Status:  Discontinued        1,750 mg 175 mL/hr over 120 Minutes Intravenous Every 24 hours 05/02/23 2211 05/03/23 1026   05/03/23 1300  Vancomycin  (VANCOCIN ) 1,500 mg in sodium chloride  0.9 % 500 mL IVPB        1,500 mg 250 mL/hr over 120 Minutes Intravenous Every 24 hours 05/03/23 1026 05/06/23 1259   05/03/23 1145  ceFAZolin  (ANCEF )  IVPB 2g/100 mL premix        2 g 200 mL/hr over 30 Minutes Intravenous On call to O.R. 05/03/23 1057 05/04/23 0559   05/03/23 0900  cefTRIAXone  (ROCEPHIN ) 2 g in sodium chloride  0.9 % 100 mL IVPB        2 g 200 mL/hr over 30 Minutes Intravenous Daily 05/03/23 0814         Procedures:   Consultants: Plastic Surgery    Assessment and Plan: * Post op infection of left breast 05-03-2023 continue with IV rocephin /Vanco. Pt to go to OR today for washout/I&D by plastic surgery Dr. Waddell. 05-04-2023 s/p abscess washout yesterday. Plastic surgery to manage pt's opiate medications. Remains on IV rocephin /vanco.  Cellulitis of left breast 05-03-2023 continue with IV rocephin /Vanco. Pt to go to OR today for washout/I&D by plastic surgery Dr. Waddell. 05-04-2023 s/p abscess washout yesterday. Plastic surgery to manage pt's opiate medications. Remains on IV rocephin /vanco.  Depression with anxiety 05-04-2023 stable. On cymbalta  60 mg daily, buspar  5 mg bid.  Hypothyroidism 05-04-2023 stable on synthroid  100 mcg daily.  Essential hypertension 05-04-2023 stable on 5 mg norvasc   Obesity, Class III, BMI 40-49.9 (morbid obesity) (HCC) BMI 41.4  Allergy-induced asthma, mild intermittent, uncomplicated 05-04-2023 continue with prn albuterol  nebs.  Hyponatremia 05-04-2023 clinically insignificant. Likely due to low solute intake due to abscess/illness.       DVT prophylaxis: SCDs Start: 05/02/23 2134  Lovenox    Code Status: Full Code Family Communication: pt's brother at bedside. Pt is decisional Disposition Plan: return home Reason for  continuing need for hospitalization: remains on IV ABX.  Objective: Vitals:   05/03/23 1933 05/04/23 0012 05/04/23 0456 05/04/23 0933  BP: 119/71 123/82 125/81 127/81  Pulse: (!) 105 (!) 106 (!) 110 (!) 105  Resp: 18 20 20 17   Temp: 98.6 F (37 C) 98.5 F (36.9 C) 98.4 F (36.9 C) 98.5 F (36.9 C)  TempSrc: Oral Oral Oral Oral  SpO2:   98% 97% 95%  Weight:      Height:        Intake/Output Summary (Last 24 hours) at 05/04/2023 1117 Last data filed at 05/04/2023 0503 Gross per 24 hour  Intake 1680 ml  Output 65 ml  Net 1615 ml   Filed Weights   05/02/23 2239 05/03/23 1419  Weight: 106 kg 105.7 kg    Examination:  Physical Exam Vitals and nursing note reviewed.  Constitutional:      Appearance: She is obese.  HENT:     Head: Normocephalic and atraumatic.     Nose: Nose normal.  Eyes:     General: No scleral icterus. Cardiovascular:     Rate and Rhythm: Normal rate and regular rhythm.  Pulmonary:     Effort: Pulmonary effort is normal. No respiratory distress.     Breath sounds: Normal breath sounds. No wheezing.  Abdominal:     General: Bowel sounds are normal. There is no distension.  Musculoskeletal:     Right lower leg: No edema.     Left lower leg: No edema.  Skin:    Capillary Refill: Capillary refill takes less than 2 seconds.  Neurological:     Mental Status: She is alert and oriented to person, place, and time.     Data Reviewed: I have personally reviewed following labs and imaging studies  CBC: Recent Labs  Lab 04/30/23 0525 05/01/23 0516 05/02/23 0617 05/03/23 0556 05/04/23 0318  WBC 14.6* 13.6* 13.7* 16.2* 14.8*  HGB 8.7* 9.5* 9.3* 10.0* 9.5*  HCT 27.0* 28.6* 28.6* 30.5* 29.3*  MCV 84.9 83.9 83.4 83.3 84.2  PLT 435* 455* 478* 510* 495*   Basic Metabolic Panel: Recent Labs  Lab 04/30/23 0525 05/01/23 0516 05/02/23 0617 05/03/23 0556 05/04/23 0318  NA 134* 133* 131* 135 132*  K 3.9 4.0 3.8 3.9 3.7  CL 105 103 101 101 103  CO2 19* 23 21* 23 20*  GLUCOSE 116* 113* 113* 102* 141*  BUN 9 10 9 7 9   CREATININE 0.75 0.80 0.77 0.96 0.72  CALCIUM 8.5* 8.9 8.7* 9.2 8.7*  MG  --  2.0 2.0  --   --    GFR: Estimated Creatinine Clearance: 105.5 mL/min (by C-G formula based on SCr of 0.72 mg/dL). Sepsis Labs: Recent Labs  Lab 04/29/23 1800 05/01/23 0516 05/04/23 0318   PROCALCITON  --  <0.10 0.21  LATICACIDVEN 0.7  --   --     Recent Results (from the past 240 hours)  Culture, blood (routine x 2)     Status: None   Collection Time: 04/29/23  6:00 PM   Specimen: BLOOD  Result Value Ref Range Status   Specimen Description BLOOD BLOOD RIGHT HAND  Final   Special Requests   Final    BOTTLES DRAWN AEROBIC AND ANAEROBIC Blood Culture results may not be optimal due to an inadequate volume of blood received in culture bottles   Culture   Final    NO GROWTH 5 DAYS Performed at [facility], [address], [place], [place] [address]  Report Status 05/04/2023 FINAL  Final  Culture, blood (routine x 2)     Status: None   Collection Time: 04/29/23  6:30 PM   Specimen: BLOOD  Result Value Ref Range Status   Specimen Description BLOOD LEFT ANTECUBITAL  Final   Special Requests   Final    BOTTLES DRAWN AEROBIC AND ANAEROBIC Blood Culture results may not be optimal due to an inadequate volume of blood received in culture bottles   Culture   Final    NO GROWTH 5 DAYS Performed at [facility], [address] [place], [place] [address]    Report Status 05/04/2023 FINAL  Final  Aerobic/Anaerobic Culture w Gram Stain (surgical/deep wound)     Status: None (Preliminary result)   Collection Time: 05/03/23  3:56 PM   Specimen: PATH Breast other; Tissue  Result Value Ref Range Status   Specimen Description TISSUE LEFT BREAST  Final   Special Requests PT ON VANCOMYCIN   Final   Gram Stain   Final    RARE WBC PRESENT, PREDOMINANTLY PMN RARE GRAM NEGATIVE RODS    Culture   Final    NO GROWTH < 12 HOURS Performed at [facility], [address], [place], [place] [address]    Report Status PENDING  Incomplete     Radiology Studies: No results found.  Scheduled Meds:  amLODipine   5 mg Oral Daily   busPIRone   5 mg Oral BID   chlorhexidine    Topical BID   DULoxetine   60 mg Oral Daily   enoxaparin  (LOVENOX ) injection  50 mg  Subcutaneous Q24H   levothyroxine   100 mcg Oral QAC breakfast   topiramate   100 mg Oral QHS   Continuous Infusions:  cefTRIAXone  (ROCEPHIN )  IV 2 g (05/04/23 0934)   vancomycin  1,500 mg (05/04/23 0938)     LOS: 2 days   Time spent: 40 minutes  Camellia Door, DO  [facility]  05/04/2023, 11:17 AM

## 2023-05-04 NOTE — Progress Notes (Signed)
   Discussed with plastic surgery. Pt's wound with purulent drainage again today per plastics. Wound was clean after washout today. Gram stain shows GNR. Pt has been on several days of IV Rocephin (5 days) and not really improving.  GNR is an odd post-op wound infection. Possibilities also include pseudomonas which rocephin  usually does not cover. Will make empiric change to IV meropenem . Continue vanco. Awaiting final intra-op culture data. May need ID consult if intra-op cultures show MDRO(multi-drug resistant organisms).  Camellia Door, DO [facility]

## 2023-05-04 NOTE — Plan of Care (Signed)
  Problem: Education: Goal: Knowledge of General Education information will improve Description: Including pain rating scale, medication(s)/side effects and non-pharmacologic comfort measures Outcome: Progressing   Problem: Activity: Goal: Risk for activity intolerance will decrease Outcome: Progressing   Problem: Nutrition: Goal: Adequate nutrition will be maintained Outcome: Progressing   Problem: Elimination: Goal: Will not experience complications related to bowel motility Outcome: Progressing   Problem: Pain Management: Goal: General experience of comfort will improve Outcome: Progressing   Problem: Safety: Goal: Ability to remain free from injury will improve Outcome: Progressing   Problem: Skin Integrity: Goal: Risk for impaired skin integrity will decrease Outcome: Progressing

## 2023-05-04 NOTE — Assessment & Plan Note (Addendum)
 05-04-2023 clinically insignificant. Likely due to low solute intake due to abscess/illness. 05-05-2023 awaiting BMP today. 05-06-2023 resolved.

## 2023-05-04 NOTE — Progress Notes (Signed)
 1 Day Post-Op  Subjective: Patient is a 42 year old female who underwent bilateral breast reduction initial with Dr. Waddell on 04/13/2023.  She subsequently developed an abscess to her left breast and has been admitted to the hospital since 04/29/2023 for observation and IV antibiotics.  She then underwent irrigation and drainage of abscess to the left breast with Dr. Waddell yesterday, 05/03/2023.  She is postop day 1.  Today, upon entering the room the patient is sitting upright in her bed in no acute distress eating her breakfast.  She states that she is still having pain to her left breast.  She states that the pain is different than prior to surgery.  She reports that the Percocet appears to be helping with the pain.  Patient states that she has been eating and drinking without issue.  Denies any nausea or vomiting.  Denies any acute events overnight.  She reports that she has been up and ambulating to the bathroom, otherwise has not been up and ambulating.  Patient also notes that she has not had a bowel movement since Friday.  Per chart review, appears 55 cc of drainage has been recorded from JP drain. WBC 16.2 -> 14.8. Patient is afebrile.  Objective: Vital signs in last 24 hours: Temp:  [97.7 F (36.5 C)-98.6 F (37 C)] 98.5 F (36.9 C) (12/31 0933) Pulse Rate:  [93-110] 105 (12/31 0933) Resp:  [14-20] 17 (12/31 0933) BP: (119-139)/(71-90) 127/81 (12/31 0933) SpO2:  [93 %-98 %] 95 % (12/31 0933) Weight:  [105.7 kg] 105.7 kg (12/30 1419)    Intake/Output from previous day: 12/30 0701 - 12/31 0700 In: 1680 [P.O.:480; I.V.:600; IV Piggyback:600] Out: 65 [Drains:55; Blood:10] Intake/Output this shift: No intake/output data recorded.  General appearance: alert, cooperative, and no distress Resp: Unlabored breathing, no respiratory distress Breasts: Left breast is soft, appears somewhat swollen.  Erythema overlying the breast is still present, but does appear to be improved.  Left NAC  is healthy.  Incision is intact.  There is diffuse tenderness to the left breast.  JP drain is noted with minimal bloody and serosanguineous drainage in the bulb.  Lab Results:     Latest Ref Rng & Units 05/04/2023    3:18 AM 05/03/2023    5:56 AM 05/02/2023    6:17 AM  CBC  WBC 4.0 - 10.5 K/uL 14.8  16.2  13.7   Hemoglobin 12.0 - 15.0 g/dL 9.5  89.9  9.3   Hematocrit 36.0 - 46.0 % 29.3  30.5  28.6   Platelets 150 - 400 K/uL 495  510  478     BMET Recent Labs    05/03/23 0556 05/04/23 0318  NA 135 132*  K 3.9 3.7  CL 101 103  CO2 23 20*  GLUCOSE 102* 141*  BUN 7 9  CREATININE 0.96 0.72  CALCIUM 9.2 8.7*   PT/INR No results for input(s): LABPROT, INR in the last 72 hours. ABG No results for input(s): PHART, HCO3 in the last 72 hours.  Invalid input(s): PCO2, PO2  Studies/Results: No results found.  Anti-infectives: Anti-infectives (From admission, onward)    Start     Dose/Rate Route Frequency Ordered Stop   05/03/23 1300  vancomycin  (VANCOREADY) IVPB 1750 mg/350 mL  Status:  Discontinued        1,750 mg 175 mL/hr over 120 Minutes Intravenous Every 24 hours 05/02/23 2211 05/03/23 1026   05/03/23 1300  Vancomycin  (VANCOCIN ) 1,500 mg in sodium chloride  0.9 % 500 mL IVPB  1,500 mg 250 mL/hr over 120 Minutes Intravenous Every 24 hours 05/03/23 1026 05/06/23 0959   05/03/23 1145  ceFAZolin  (ANCEF ) IVPB 2g/100 mL premix  Status:  Discontinued        2 g 200 mL/hr over 30 Minutes Intravenous On call to O.R. 05/03/23 1057 05/03/23 1740   05/03/23 0900  cefTRIAXone  (ROCEPHIN ) 2 g in sodium chloride  0.9 % 100 mL IVPB        2 g 200 mL/hr over 30 Minutes Intravenous Daily 05/03/23 0814         Assessment/Plan: s/p Procedure(s): IRRIGATION AND DEBRIDEMENT WOUND Plan: -Patient postop day 1 still experiencing pain to the left breast.  WBC remains elevated at 14.8, but improved from yesterday from 16.2.  Patient is afebrile. -Will adjust pain  medications, will add IV breakthrough pain medication as well as ibuprofen  -Encourage patient to get up and ambulate today. -MiraLAX  given by nurse during encounter today. -Medical management per primary -Patient not yet ready for discharge  LOS: 2 days    Catherine FORBES Peck, PA-C 05/04/2023

## 2023-05-05 DIAGNOSIS — N61 Mastitis without abscess: Secondary | ICD-10-CM | POA: Diagnosis not present

## 2023-05-05 DIAGNOSIS — E039 Hypothyroidism, unspecified: Secondary | ICD-10-CM | POA: Diagnosis not present

## 2023-05-05 DIAGNOSIS — T8142XA Infection following a procedure, deep incisional surgical site, initial encounter: Secondary | ICD-10-CM | POA: Diagnosis not present

## 2023-05-05 DIAGNOSIS — I10 Essential (primary) hypertension: Secondary | ICD-10-CM | POA: Diagnosis not present

## 2023-05-05 LAB — CBC
HCT: 30.3 % — ABNORMAL LOW (ref 36.0–46.0)
Hemoglobin: 9.8 g/dL — ABNORMAL LOW (ref 12.0–15.0)
MCH: 27 pg (ref 26.0–34.0)
MCHC: 32.3 g/dL (ref 30.0–36.0)
MCV: 83.5 fL (ref 80.0–100.0)
Platelets: 529 10*3/uL — ABNORMAL HIGH (ref 150–400)
RBC: 3.63 MIL/uL — ABNORMAL LOW (ref 3.87–5.11)
RDW: 14.1 % (ref 11.5–15.5)
WBC: 10.8 10*3/uL — ABNORMAL HIGH (ref 4.0–10.5)
nRBC: 0.5 % — ABNORMAL HIGH (ref 0.0–0.2)

## 2023-05-05 LAB — BASIC METABOLIC PANEL
Anion gap: 9 (ref 5–15)
BUN: 9 mg/dL (ref 6–20)
CO2: 20 mmol/L — ABNORMAL LOW (ref 22–32)
Calcium: 8.9 mg/dL (ref 8.9–10.3)
Chloride: 106 mmol/L (ref 98–111)
Creatinine, Ser: 0.78 mg/dL (ref 0.44–1.00)
GFR, Estimated: >60 mL/min (ref >60)
Glucose, Bld: 115 mg/dL — ABNORMAL HIGH (ref 70–99)
Potassium: 3.9 mmol/L (ref 3.5–5.1)
Sodium: 135 mmol/L (ref 135–145)

## 2023-05-05 LAB — VANCOMYCIN, TROUGH: Vancomycin Tr: 6 ug/mL — ABNORMAL LOW (ref 15–20)

## 2023-05-05 MED ORDER — POLYETHYLENE GLYCOL 3350 17 G PO PACK: 34.0000 g | PACK | Freq: Every day | ORAL | Status: DC | PRN

## 2023-05-05 MED ORDER — BISACODYL 5 MG PO TBEC
10.0000 mg | DELAYED_RELEASE_TABLET | Freq: Every day | ORAL | Status: DC
  Administered 2023-05-05 – 2023-05-07 (×3): 10 mg via ORAL
  Filled 2023-05-05 (×4): qty 2

## 2023-05-05 NOTE — Plan of Care (Signed)
 Patient alert and oriented. No distress noted. Medicated for pain as needed throughout shift with some relief. Family at bedside. Will continue to monitor.   Problem: Education: Goal: Knowledge of General Education information will improve Description: Including pain rating scale, medication(s)/side effects and non-pharmacologic comfort measures Outcome: Progressing   Problem: Health Behavior/Discharge Planning: Goal: Ability to manage health-related needs will improve Outcome: Progressing   Problem: Clinical Measurements: Goal: Ability to maintain clinical measurements within normal limits will improve Outcome: Progressing Goal: Will remain free from infection Outcome: Progressing Goal: Diagnostic test results will improve Outcome: Progressing Goal: Respiratory complications will improve Outcome: Progressing Goal: Cardiovascular complication will be avoided Outcome: Progressing   Problem: Activity: Goal: Risk for activity intolerance will decrease Outcome: Progressing   Problem: Nutrition: Goal: Adequate nutrition will be maintained Outcome: Progressing   Problem: Coping: Goal: Level of anxiety will decrease Outcome: Progressing   Problem: Elimination: Goal: Will not experience complications related to bowel motility Outcome: Progressing Goal: Will not experience complications related to urinary retention Outcome: Progressing   Problem: Pain Management: Goal: General experience of comfort will improve Outcome: Progressing   Problem: Safety: Goal: Ability to remain free from injury will improve Outcome: Progressing   Problem: Skin Integrity: Goal: Risk for impaired skin integrity will decrease Outcome: Progressing

## 2023-05-05 NOTE — Progress Notes (Signed)
 No significant change over night.She complains of continued pain in the left breast but admits she is moving around better. She is tolerating a diet.  She remains afebrile.Today's CBC is pending blood draw this afternoon.  She still has a small amount of purulent drainage on her dressings. The breast is tender to palpation but the erythema has improved. Drain output remains around 50-60 ml per 24 hours.  Awaiting final culture results. Antibiotics have been modified based on gram stain results and patients failure to progress after surgery.  Appreciate Dr Kathaleen assistance and medical management.  Continue current management. Hopeful for discharge by the weekend.

## 2023-05-05 NOTE — Progress Notes (Signed)
 PROGRESS NOTE    Catherine Berg  FMW:[id number] DOB: [birth year] DOA: 05/02/2023 PCP: Sowles, Krichna, MD  Subjective: Pt seen and examined. Pt still having lots of pain in left breast. IV ABX changed to meropenem  due to failure to improved on IV Rocephin . IV Vanco continued Gram stain from intra-op culture showing GNR. No final ID yet.    Hospital Course: HPI: Catherine Berg is a 43 y.o. female with medical history significant for hypertension, hypothyroidism, asthma, depression, anxiety, BMI 42, and symptomatic macromastia s/p bilateral breast reduction surgery on 04/13/2023 who presents with concerns for infection of the left breast.     [facility] ED & Hospital Course: Patient presented to the ED on 04/29/2023 with severe left breast pain, redness, and heat.  She also reported a fever to 101F at home.  She had blood cultures drawn which have been negative to date.  CT in the ED demonstrated loosely organized fluid collections in the bilateral breasts.   She was admitted to the hospitalist service on 04/29/2023, started on Rocephin  and vancomycin , but failed to show any clinical improvement, developed malodorous purulent drainage, and plastic surgeon (Dr. Waddell) recommended transfer to [facility] for likely operative management.    Significant Events: Admitted 05/02/2023 as a transfer from [facility] due to post-op infection of her left breast surgery   Significant Labs:   Significant Imaging Studies:   Antibiotic Therapy: Anti-infectives (From admission, onward)    Start     Dose/Rate Route Frequency Ordered Stop   05/03/23 1300  vancomycin  (VANCOREADY) IVPB 1750 mg/350 mL  Status:  Discontinued        1,750 mg 175 mL/hr over 120 Minutes Intravenous Every 24 hours 05/02/23 2211 05/03/23 1026   05/03/23 1300  Vancomycin  (VANCOCIN ) 1,500 mg in sodium chloride  0.9 % 500 mL IVPB        1,500 mg 250 mL/hr over 120 Minutes Intravenous Every 24 hours 05/03/23 1026 05/06/23 1259    05/03/23 1145  ceFAZolin  (ANCEF ) IVPB 2g/100 mL premix        2 g 200 mL/hr over 30 Minutes Intravenous On call to O.R. 05/03/23 1057 05/04/23 0559   05/03/23 0900  cefTRIAXone  (ROCEPHIN ) 2 g in sodium chloride  0.9 % 100 mL IVPB        2 g 200 mL/hr over 30 Minutes Intravenous Daily 05/03/23 0814         Procedures:   Consultants: Plastic Surgery    Assessment and Plan: * Post op infection of left breast 05-03-2023 continue with IV rocephin /Vanco. Pt to go to OR today for washout/I&D by plastic surgery Dr. Waddell. 05-04-2023 s/p abscess washout yesterday. Plastic surgery to manage pt's opiate medications. Remains on IV rocephin /vanco. 05-05-2023 IV ABX changed to IV meropenem  yesterday after conversation with Dr. Waddell with plastic surgery. Abx changed due to failure to improve and continue purulent drainage from left breast despite washout and surgical I&D. Intra-op gram stain showing GNR. Concern for possible pseudomonas or other MRDO given lack of improvement on IV Rocephin . Continue IV Vanco for now. Awaiting final ID on intra-op cultures.  Cellulitis of left breast 05-03-2023 continue with IV rocephin /Vanco. Pt to go to OR today for washout/I&D by plastic surgery Dr. Waddell. 05-04-2023 s/p abscess washout yesterday. Plastic surgery to manage pt's opiate medications. Remains on IV rocephin /vanco. 05-05-2023 IV ABX changed to IV meropenem  yesterday after conversation with Dr. Waddell with plastic surgery. Abx changed due to failure to improve and continue purulent drainage from left breast  despite washout and surgical I&D. Intra-op gram stain showing GNR. Concern for possible pseudomonas or other MRDO given lack of improvement on IV Rocephin . Continue IV Vanco for now. Awaiting final ID on intra-op cultures.   Depression with anxiety 05-04-2023 stable. On cymbalta  60 mg daily, buspar  5 mg bid. 05-05-2023 stable.  Hypothyroidism 05-04-2023 stable on synthroid  100 mcg  daily. 05-05-2023 stable.  Essential hypertension 05-04-2023 stable on 5 mg norvasc  05-05-2023 stable BP  Obesity, Class III, BMI 40-49.9 (morbid obesity) (HCC) BMI 41.4  Allergy-induced asthma, mild intermittent, uncomplicated 05-04-2023 continue with prn albuterol  nebs.  Hyponatremia 05-04-2023 clinically insignificant. Likely due to low solute intake due to abscess/illness. 05-05-2023 awaiting BMP today.       DVT prophylaxis: SCDs Start: 05/02/23 2134  Lovenox    Code Status: Full Code Family Communication: pt's brother at bedside Disposition Plan: return home Reason for continuing need for hospitalization: remains on IV ABX.  Objective: Vitals:   05/04/23 1618 05/04/23 2233 05/05/23 0450 05/05/23 0739  BP: 109/70 106/74 121/76 105/67  Pulse: (!) 102 90 89 92  Resp: 17 18 17 16   Temp: 98.7 F (37.1 C) 98.2 F (36.8 C) 97.9 F (36.6 C) 98.6 F (37 C)  TempSrc: Oral Oral Oral Oral  SpO2: 98% 97% 100% 100%  Weight:      Height:        Intake/Output Summary (Last 24 hours) at 05/05/2023 0941 Last data filed at 05/05/2023 0459 Gross per 24 hour  Intake 1060 ml  Output 50 ml  Net 1010 ml   Filed Weights   05/02/23 2239 05/03/23 1419  Weight: 106 kg 105.7 kg    Examination:  Physical Exam Vitals and nursing note reviewed.  Constitutional:      Appearance: She is obese.  HENT:     Head: Normocephalic and atraumatic.     Nose: Nose normal.  Eyes:     General: No scleral icterus. Cardiovascular:     Rate and Rhythm: Normal rate and regular rhythm.  Pulmonary:     Effort: Pulmonary effort is normal.     Breath sounds: Normal breath sounds.  Abdominal:     General: Bowel sounds are normal.     Palpations: Abdomen is soft.  Skin:    Capillary Refill: Capillary refill takes less than 2 seconds.  Neurological:     Mental Status: She is alert and oriented to person, place, and time.     Data Reviewed: I have personally reviewed following labs and  imaging studies  CBC: Recent Labs  Lab 04/30/23 0525 05/01/23 0516 05/02/23 0617 05/03/23 0556 05/04/23 0318  WBC 14.6* 13.6* 13.7* 16.2* 14.8*  HGB 8.7* 9.5* 9.3* 10.0* 9.5*  HCT 27.0* 28.6* 28.6* 30.5* 29.3*  MCV 84.9 83.9 83.4 83.3 84.2  PLT 435* 455* 478* 510* 495*   Basic Metabolic Panel: Recent Labs  Lab 04/30/23 0525 05/01/23 0516 05/02/23 0617 05/03/23 0556 05/04/23 0318  NA 134* 133* 131* 135 132*  K 3.9 4.0 3.8 3.9 3.7  CL 105 103 101 101 103  CO2 19* 23 21* 23 20*  GLUCOSE 116* 113* 113* 102* 141*  BUN 9 10 9 7 9   CREATININE 0.75 0.80 0.77 0.96 0.72  CALCIUM 8.5* 8.9 8.7* 9.2 8.7*  MG  --  2.0 2.0  --   --    GFR: Estimated Creatinine Clearance: 105.5 mL/min (by C-G formula based on SCr of 0.72 mg/dL). Sepsis Labs: Recent Labs  Lab 04/29/23 1800 05/01/23 0516 05/04/23 9681  PROCALCITON  --  <0.10 0.21  LATICACIDVEN 0.7  --   --     Recent Results (from the past 240 hours)  Culture, blood (routine x 2)     Status: None   Collection Time: 04/29/23  6:00 PM   Specimen: BLOOD  Result Value Ref Range Status   Specimen Description BLOOD BLOOD RIGHT HAND  Final   Special Requests   Final    BOTTLES DRAWN AEROBIC AND ANAEROBIC Blood Culture results may not be optimal due to an inadequate volume of blood received in culture bottles   Culture   Final    NO GROWTH 5 DAYS Performed at [facility], [address] [place], [place] [address]    Report Status 05/04/2023 FINAL  Final  Culture, blood (routine x 2)     Status: None   Collection Time: 04/29/23  6:30 PM   Specimen: BLOOD  Result Value Ref Range Status   Specimen Description BLOOD LEFT ANTECUBITAL  Final   Special Requests   Final    BOTTLES DRAWN AEROBIC AND ANAEROBIC Blood Culture results may not be optimal due to an inadequate volume of blood received in culture bottles   Culture   Final    NO GROWTH 5 DAYS Performed at [facility], [address], [place],  [place] [address]    Report Status 05/04/2023 FINAL  Final  Aerobic/Anaerobic Culture w Gram Stain (surgical/deep wound)     Status: None (Preliminary result)   Collection Time: 05/03/23  3:56 PM   Specimen: PATH Breast other; Tissue  Result Value Ref Range Status   Specimen Description TISSUE LEFT BREAST  Final   Special Requests PT ON VANCOMYCIN   Final   Gram Stain   Final    RARE WBC PRESENT, PREDOMINANTLY PMN RARE GRAM NEGATIVE RODS    Culture   Final    NO GROWTH < 12 HOURS Performed at [facility], [address] [place], [place] [address]    Report Status PENDING  Incomplete     Radiology Studies: No results found.  Scheduled Meds:  amLODipine   5 mg Oral Daily   bisacodyl   10 mg Oral Daily   busPIRone   5 mg Oral BID   chlorhexidine    Topical BID   DULoxetine   60 mg Oral Daily   enoxaparin  (LOVENOX ) injection  50 mg Subcutaneous Q24H   levothyroxine   100 mcg Oral QAC breakfast   topiramate   100 mg Oral QHS   Continuous Infusions:  meropenem  (MERREM ) IV 2 g (05/05/23 0516)   vancomycin        LOS: 3 days   Time spent: 40 minutes  Camellia Door, DO  [facility]  05/05/2023, 9:41 AM

## 2023-05-06 ENCOUNTER — Encounter: Payer: Self-pay | Admitting: Family Medicine

## 2023-05-06 DIAGNOSIS — T8142XA Infection following a procedure, deep incisional surgical site, initial encounter: Secondary | ICD-10-CM | POA: Diagnosis not present

## 2023-05-06 DIAGNOSIS — N61 Mastitis without abscess: Secondary | ICD-10-CM | POA: Diagnosis not present

## 2023-05-06 DIAGNOSIS — E039 Hypothyroidism, unspecified: Secondary | ICD-10-CM | POA: Diagnosis not present

## 2023-05-06 DIAGNOSIS — I10 Essential (primary) hypertension: Secondary | ICD-10-CM | POA: Diagnosis not present

## 2023-05-06 MED ORDER — ENOXAPARIN SODIUM 60 MG/0.6ML IJ SOSY
50.0000 mg | PREFILLED_SYRINGE | INTRAMUSCULAR | Status: DC
  Administered 2023-05-08 – 2023-05-09 (×2): 50 mg via SUBCUTANEOUS
  Filled 2023-05-06 (×2): qty 0.6

## 2023-05-06 MED ORDER — OXYCODONE-ACETAMINOPHEN 5-325 MG PO TABS
1.0000 | ORAL_TABLET | ORAL | Status: DC | PRN
  Administered 2023-05-06 (×2): 2 via ORAL
  Administered 2023-05-09: 1 via ORAL
  Filled 2023-05-06: qty 1
  Filled 2023-05-06 (×2): qty 2

## 2023-05-06 MED ORDER — KCL IN DEXTROSE-NACL 20-5-0.45 MEQ/L-%-% IV SOLN
INTRAVENOUS | Status: DC
Start: 2023-05-07 — End: 2023-05-07
  Filled 2023-05-06 (×2): qty 1000

## 2023-05-06 MED ORDER — SODIUM CHLORIDE 0.9 % IV SOLN: 1.0000 g | Freq: Three times a day (TID) | INTRAVENOUS | Status: DC

## 2023-05-06 MED ORDER — SODIUM CHLORIDE 0.9 % IV SOLN
2.0000 g | INTRAVENOUS | Status: DC
  Administered 2023-05-06 – 2023-05-07 (×2): 2 g via INTRAVENOUS
  Filled 2023-05-06 (×2): qty 20

## 2023-05-06 MED ORDER — NALOXEGOL OXALATE 12.5 MG PO TABS
12.5000 mg | ORAL_TABLET | Freq: Every day | ORAL | Status: DC
Start: 2023-05-06 — End: 2023-05-08
  Administered 2023-05-06 – 2023-05-08 (×3): 12.5 mg via ORAL
  Filled 2023-05-06 (×3): qty 1

## 2023-05-06 MED ORDER — METRONIDAZOLE 500 MG PO TABS
500.0000 mg | ORAL_TABLET | Freq: Two times a day (BID) | ORAL | Status: DC
  Administered 2023-05-06 – 2023-05-09 (×7): 500 mg via ORAL
  Filled 2023-05-06 (×7): qty 1

## 2023-05-06 MED ORDER — POLYETHYLENE GLYCOL 3350 17 G PO PACK
34.0000 g | PACK | Freq: Every day | ORAL | Status: DC
  Administered 2023-05-06 – 2023-05-07 (×2): 34 g via ORAL
  Filled 2023-05-06 (×3): qty 2

## 2023-05-06 MED ORDER — BISACODYL 10 MG RE SUPP
10.0000 mg | Freq: Every day | RECTAL | Status: DC | PRN
  Administered 2023-05-06: 10 mg via RECTAL
  Filled 2023-05-06: qty 1

## 2023-05-06 MED ORDER — MORPHINE SULFATE (PF) 2 MG/ML IV SOLN
2.0000 mg | INTRAVENOUS | Status: DC | PRN
  Administered 2023-05-06: 2 mg via INTRAVENOUS
  Filled 2023-05-06: qty 1

## 2023-05-06 NOTE — Progress Notes (Signed)
 PROGRESS NOTE    Catherine Berg  FMW:[id number] DOB: 1980-07-07 DOA: 05/02/2023 PCP: Sowles, Krichna, MD  Subjective: Pt seen and examined. Intra-op cultures growing CITROBACTER KOSERI. No sensitivities. Remains on IV Vanco Day #7 and IV Meropenem  Day #2  Pt continues to c/o of fullness in left upper outer breast quadrant. Minimal drainage from JP drain.   Hospital Course: HPI: Catherine Berg is a 43 y.o. female with medical history significant for hypertension, hypothyroidism, asthma, depression, anxiety, BMI 42, and symptomatic macromastia s/p bilateral breast reduction surgery on 04/13/2023 who presents with concerns for infection of the left breast.     [facility] Hospital Course: Patient presented to the ED on 04/29/2023 with severe left breast pain, redness, and heat.  She also reported a fever to 101F at home.  She had blood cultures drawn which have been negative to date.  CT in the ED demonstrated loosely organized fluid collections in the bilateral breasts.   She was admitted to the hospitalist service on 04/29/2023, started on Rocephin  and vancomycin , but failed to show any clinical improvement, developed malodorous purulent drainage, and plastic surgeon (Dr. Waddell) recommended transfer to [facility] for likely operative management.    Significant Events: Admitted 05/02/2023 as a transfer from [facility] due to post-op infection of her left breast surgery   Significant Labs:   Significant Imaging Studies:   Antibiotic Therapy: Anti-infectives (From admission, onward)    Start     Dose/Rate Route Frequency Ordered Stop   05/03/23 1300  vancomycin  (VANCOREADY) IVPB 1750 mg/350 mL  Status:  Discontinued        1,750 mg 175 mL/hr over 120 Minutes Intravenous Every 24 hours 05/02/23 2211 05/03/23 1026   05/03/23 1300  Vancomycin  (VANCOCIN ) 1,500 mg in sodium chloride  0.9 % 500 mL IVPB        1,500 mg 250 mL/hr over 120 Minutes Intravenous Every 24 hours 05/03/23  1026 05/06/23 1259   05/03/23 1145  ceFAZolin  (ANCEF ) IVPB 2g/100 mL premix        2 g 200 mL/hr over 30 Minutes Intravenous On call to O.R. 05/03/23 1057 05/04/23 0559   05/03/23 0900  cefTRIAXone  (ROCEPHIN ) 2 g in sodium chloride  0.9 % 100 mL IVPB        2 g 200 mL/hr over 30 Minutes Intravenous Daily 05/03/23 0814         Procedures:   Consultants: Plastic Surgery    Assessment and Plan: * Post op infection of left breast 05-03-2023 continue with IV rocephin /Vanco. Pt to go to OR today for washout/I&D by plastic surgery Dr. Waddell. 05-04-2023 s/p abscess washout yesterday. Plastic surgery to manage pt's opiate medications. Remains on IV rocephin /vanco. 05-05-2023 IV ABX changed to IV meropenem  yesterday after conversation with Dr. Waddell with plastic surgery. Abx changed due to failure to improve and continue purulent drainage from left breast despite washout and surgical I&D. Intra-op gram stain showing GNR. Concern for possible pseudomonas or other MRDO given lack of improvement on IV Rocephin . Continue IV Vanco for now. Awaiting final ID on intra-op cultures. 05-06-2023 remains on IV meropenem  Day #2, Vanco #7. Completed 5 days of IV rocephin . Intra-op cultures growing CITROBACTER KOSERI. No final sensitivities. Awaiting final cultures. Increase IV morphine  to 2 mg prn. Discussed with ID. Add po flagyl  500 mg bid. DC IV Vanco  Cellulitis of left breast 05-03-2023 continue with IV rocephin /Vanco. Pt to go to OR today for washout/I&D by plastic surgery Dr. Waddell. 05-04-2023 s/p  abscess washout yesterday. Plastic surgery to manage pt's opiate medications. Remains on IV rocephin /vanco. 05-05-2023 IV ABX changed to IV meropenem  yesterday after conversation with Dr. Waddell with plastic surgery. Abx changed due to failure to improve and continue purulent drainage from left breast despite washout and surgical I&D. Intra-op gram stain showing GNR. Concern for possible pseudomonas or other  MRDO given lack of improvement on IV Rocephin . Continue IV Vanco for now. Awaiting final ID on intra-op cultures.  05-06-2023 remains on IV meropenem  Day #2, Vanco #7. Completed 5 days of IV rocephin . Intra-op cultures growing CITROBACTER KOSERI. No final sensitivities. Awaiting final cultures. Increase IV morphine  to 2 mg prn. Discussed with ID. Add po flagyl  500 mg bid. DC IV Vanco  Depression with anxiety 05-04-2023 - 05-06-2023 stable. On cymbalta  60 mg daily, buspar  5 mg bid.  Acquired hypothyroidism 05-04-2023 - 05-06-2023 stable on synthroid  100 mcg daily.  Essential hypertension 05-04-2023 - 05-06-2023 stable on 5 mg norvasc   Obesity, Class III, BMI 40-49.9 (morbid obesity) (HCC) BMI 41.4  Allergy-induced asthma, mild intermittent, uncomplicated 05-04-2023 continue with prn albuterol  nebs.  Hyponatremia 05-04-2023 clinically insignificant. Likely due to low solute intake due to abscess/illness. 05-05-2023 awaiting BMP today. 05-06-2023 resolved.   DVT prophylaxis: SCDs Start: 05/02/23 2134    Code Status: Full Code Family Communication: discussed with pt and brother at bedside Disposition Plan: return home Reason for continuing need for hospitalization: remains on IV ABX  Objective: Vitals:   05/05/23 2100 05/06/23 0354 05/06/23 0831 05/06/23 0853  BP: 120/74 129/79 113/84 119/86  Pulse: 90 87 75   Resp: 17 18 16    Temp: 98.2 F (36.8 C) 98.4 F (36.9 C) 98.1 F (36.7 C)   TempSrc: Oral Oral Oral   SpO2: 98% 100% 100%   Weight:      Height:        Intake/Output Summary (Last 24 hours) at 05/06/2023 1054 Last data filed at 05/06/2023 0600 Gross per 24 hour  Intake 1400 ml  Output 345 ml  Net 1055 ml   Filed Weights   05/02/23 2239 05/03/23 1419  Weight: 106 kg 105.7 kg    Examination:  Physical Exam Vitals and nursing note reviewed.  Constitutional:      General: She is not in acute distress.    Appearance: She is obese. She is not toxic-appearing.   HENT:     Head: Normocephalic and atraumatic.     Nose: Nose normal.  Eyes:     General: No scleral icterus. Cardiovascular:     Rate and Rhythm: Normal rate and regular rhythm.  Pulmonary:     Effort: Pulmonary effort is normal.     Breath sounds: Normal breath sounds.  Abdominal:     General: Abdomen is flat. Bowel sounds are normal.  Skin:    General: Skin is warm and dry.     Capillary Refill: Capillary refill takes less than 2 seconds.     Comments: Tenderness left outer upper breast quadrant  Neurological:     Mental Status: She is alert and oriented to person, place, and time.     Data Reviewed: I have personally reviewed following labs and imaging studies  CBC: Recent Labs  Lab 05/01/23 0516 05/02/23 0617 05/03/23 0556 05/04/23 0318 05/05/23 1048  WBC 13.6* 13.7* 16.2* 14.8* 10.8*  HGB 9.5* 9.3* 10.0* 9.5* 9.8*  HCT 28.6* 28.6* 30.5* 29.3* 30.3*  MCV 83.9 83.4 83.3 84.2 83.5  PLT 455* 478* 510* 495* 529*   Basic  Metabolic Panel: Recent Labs  Lab 05/01/23 0516 05/02/23 0617 05/03/23 0556 05/04/23 0318 05/05/23 1048  NA 133* 131* 135 132* 135  K 4.0 3.8 3.9 3.7 3.9  CL 103 101 101 103 106  CO2 23 21* 23 20* 20*  GLUCOSE 113* 113* 102* 141* 115*  BUN 10 9 7 9 9   CREATININE 0.80 0.77 0.96 0.72 0.78  CALCIUM 8.9 8.7* 9.2 8.7* 8.9  MG 2.0 2.0  --   --   --    GFR: Estimated Creatinine Clearance: 105.5 mL/min (by C-G formula based on SCr of 0.78 mg/dL).  Sepsis Labs: Recent Labs  Lab 04/29/23 1800 05/01/23 0516 05/04/23 0318  PROCALCITON  --  <0.10 0.21  LATICACIDVEN 0.7  --   --     Recent Results (from the past 240 hours)  Culture, blood (routine x 2)     Status: None   Collection Time: 04/29/23  6:00 PM   Specimen: BLOOD  Result Value Ref Range Status   Specimen Description BLOOD BLOOD RIGHT HAND  Final   Special Requests   Final    BOTTLES DRAWN AEROBIC AND ANAEROBIC Blood Culture results may not be optimal due to an inadequate volume  of blood received in culture bottles   Culture   Final    NO GROWTH 5 DAYS Performed at [facility], [address] [place], [place] [address]    Report Status 05/04/2023 FINAL  Final  Culture, blood (routine x 2)     Status: None   Collection Time: 04/29/23  6:30 PM   Specimen: BLOOD  Result Value Ref Range Status   Specimen Description BLOOD LEFT ANTECUBITAL  Final   Special Requests   Final    BOTTLES DRAWN AEROBIC AND ANAEROBIC Blood Culture results may not be optimal due to an inadequate volume of blood received in culture bottles   Culture   Final    NO GROWTH 5 DAYS Performed at [facility], [address], [place], [place] [address]    Report Status 05/04/2023 FINAL  Final  Aerobic/Anaerobic Culture w Gram Stain (surgical/deep wound)     Status: None (Preliminary result)   Collection Time: 05/03/23  3:56 PM   Specimen: PATH Breast other; Tissue  Result Value Ref Range Status   Specimen Description TISSUE LEFT BREAST  Final   Special Requests PT ON VANCOMYCIN   Final   Gram Stain   Final    RARE WBC PRESENT, PREDOMINANTLY PMN RARE GRAM NEGATIVE RODS    Culture   Final    RARE CITROBACTER KOSERI FEW PREVOTELLA BIVIA BETA LACTAMASE POSITIVE Performed at [facility], [address], [place], [place] [address]    Report Status PENDING  Incomplete     Radiology Studies: No results found.  Scheduled Meds:  amLODipine   5 mg Oral Daily   bisacodyl   10 mg Oral Daily   busPIRone   5 mg Oral BID   chlorhexidine    Topical BID   DULoxetine   60 mg Oral Daily   enoxaparin  (LOVENOX ) injection  50 mg Subcutaneous Q24H   levothyroxine   100 mcg Oral QAC breakfast   metroNIDAZOLE   500 mg Oral Q12H   naloxegol  oxalate  12.5 mg Oral Daily   polyethylene glycol  34 g Oral Daily   topiramate   100 mg Oral QHS   Continuous Infusions:  meropenem  (MERREM ) IV 2 g (05/06/23 0504)   vancomycin  1,500 mg (05/06/23 0854)     LOS: 4 days  Time spent: 40  minutes  Camellia Door, DO  [facility]  05/06/2023, 10:54 AM

## 2023-05-06 NOTE — Progress Notes (Signed)
 POD # 3 from washout of left breast.  Remains afebrile, WBC is decreasing. Cxs show citrobacter  Catherine Berg still complains of persistent pain in the upper outer portion of the left breast.  Drain output is turbid. The breast is firm and tender to palpation.  I am concerned that Catherine Berg is not progressing as expected. My expectation was that Catherine Berg would rapidly improve after the initial washout.  Will make her NPO after midnight. If Catherine Berg still has pain tomorrow or the indurated tissue has not improved, will return to the operating room for re-exploration of the breast.  Catherine Berg understands and agrees

## 2023-05-06 NOTE — Plan of Care (Signed)

## 2023-05-07 DIAGNOSIS — I10 Essential (primary) hypertension: Secondary | ICD-10-CM | POA: Diagnosis not present

## 2023-05-07 DIAGNOSIS — K5903 Drug induced constipation: Secondary | ICD-10-CM

## 2023-05-07 DIAGNOSIS — T8142XA Infection following a procedure, deep incisional surgical site, initial encounter: Secondary | ICD-10-CM | POA: Diagnosis not present

## 2023-05-07 DIAGNOSIS — E039 Hypothyroidism, unspecified: Secondary | ICD-10-CM | POA: Diagnosis not present

## 2023-05-07 DIAGNOSIS — N61 Mastitis without abscess: Secondary | ICD-10-CM | POA: Diagnosis not present

## 2023-05-07 DIAGNOSIS — T402X5A Adverse effect of other opioids, initial encounter: Secondary | ICD-10-CM

## 2023-05-07 NOTE — Assessment & Plan Note (Addendum)
 05/07/2023 continue movantik while on po/IV opiates. Continue daily miralax. 05-08-2023 resolved. Pt with multiple Bms. Pt trying not to take opiates. Will stop movantik. Change laxatives to prn.

## 2023-05-07 NOTE — Progress Notes (Signed)
 4 Days Post-Op  Subjective: Patient is a 43 year old female who underwent bilateral breast reduction initial with Dr. Waddell on 04/13/2023. She subsequently developed an abscess to her left breast and has been admitted to the hospital since 04/29/2023 for observation and IV antibiotics. She then underwent irrigation and drainage of abscess to the left breast with Dr. Waddell on 05/03/2023.   Today, patient is sitting upright in no acute distress in her bed.  She states that she is still having some pain and tenderness to her left breast.  She states though that it is neither worsened nor significantly improved.  She denies any acute events overnight.  Patient's brother is also in the room with her and he states that he feels patient has been improving overall.  He reports that she has been up and ambulating, eating more and has had a bowel movement.  Per chart review, it appears on 05/05/2023, WBC was 10.8.  Patient is afebrile, tachycardia has resolved.  Appears drain output has been 50 cc since yesterday.  Cultures grew out Citrobacter.  Objective: Vital signs in last 24 hours: Temp:  [98.6 F (37 C)-98.8 F (37.1 C)] 98.7 F (37.1 C) (01/03 0803) Pulse Rate:  [83-89] 84 (01/03 0803) Resp:  [17-18] 17 (01/03 0803) BP: (115-142)/(66-85) 125/71 (01/03 1329) SpO2:  [98 %-100 %] 99 % (01/03 0803) Last BM Date : 05/07/23  Intake/Output from previous day: 01/02 0701 - 01/03 0700 In: 960 [P.O.:960] Out: 50 [Drains:50] Intake/Output this shift: Total I/O In: 1006.3 [I.V.:1006.3] Out: 0   General appearance: alert, cooperative, and no distress Resp: Unlabored breathing, no respiratory distress Breasts: Left breast is soft, slightly swollen, mildly tender.  Erythema over the breast appears to be improving.  There does appear to be a minimal amount of cloudy drainage noted to be coming from the inferior T-zone of the incision.  Incision to the left breast otherwise appears to be intact.  Left JP  drain has minimal slightly cloudy serosanguineous drainage in the bulb with some fat noted as well.  There is no overlying erythema or active drainage to the right breast.  There is a little bit of superficial exudate.  No signs of infection to the right breast.  Lab Results:     Latest Ref Rng & Units 05/05/2023   10:48 AM 05/04/2023    3:18 AM 05/03/2023    5:56 AM  CBC  WBC 4.0 - 10.5 K/uL 10.8  14.8  16.2   Hemoglobin 12.0 - 15.0 g/dL 9.8  9.5  89.9   Hematocrit 36.0 - 46.0 % 30.3  29.3  30.5   Platelets 150 - 400 K/uL 529  495  510     BMET Recent Labs    05/05/23 1048  NA 135  K 3.9  CL 106  CO2 20*  GLUCOSE 115*  BUN 9  CREATININE 0.78  CALCIUM 8.9   PT/INR No results for input(s): LABPROT, INR in the last 72 hours. ABG No results for input(s): PHART, HCO3 in the last 72 hours.  Invalid input(s): PCO2, PO2  Studies/Results: No results found.  Anti-infectives: Anti-infectives (From admission, onward)    Start     Dose/Rate Route Frequency Ordered Stop   05/06/23 1415  meropenem  (MERREM ) 1 g in sodium chloride  0.9 % 100 mL IVPB  Status:  Discontinued        1 g 200 mL/hr over 30 Minutes Intravenous Every 8 hours 05/06/23 1318 05/06/23 1323   05/06/23 1415  cefTRIAXone  (ROCEPHIN )  2 g in sodium chloride  0.9 % 100 mL IVPB        2 g 200 mL/hr over 30 Minutes Intravenous Every 24 hours 05/06/23 1323     05/06/23 1145  metroNIDAZOLE  (FLAGYL ) tablet 500 mg        500 mg Oral Every 12 hours 05/06/23 1048     05/05/23 1000  Vancomycin  (VANCOCIN ) 1,500 mg in sodium chloride  0.9 % 500 mL IVPB  Status:  Discontinued        1,500 mg 250 mL/hr over 120 Minutes Intravenous Every 24 hours 05/04/23 1120 05/06/23 1237   05/04/23 1800  meropenem  (MERREM ) 2 g in sodium chloride  0.9 % 100 mL IVPB  Status:  Discontinued        2 g 280 mL/hr over 30 Minutes Intravenous Every 8 hours 05/04/23 1657 05/06/23 1318   05/04/23 1700  meropenem  (MERREM ) 500 mg in sodium  chloride 0.9 % 100 mL IVPB  Status:  Discontinued        500 mg 200 mL/hr over 30 Minutes Intravenous Every 8 hours 05/04/23 1610 05/04/23 1657   05/03/23 1300  vancomycin  (VANCOREADY) IVPB 1750 mg/350 mL  Status:  Discontinued        1,750 mg 175 mL/hr over 120 Minutes Intravenous Every 24 hours 05/02/23 2211 05/03/23 1026   05/03/23 1300  Vancomycin  (VANCOCIN ) 1,500 mg in sodium chloride  0.9 % 500 mL IVPB  Status:  Discontinued        1,500 mg 250 mL/hr over 120 Minutes Intravenous Every 24 hours 05/03/23 1026 05/04/23 1120   05/03/23 1145  ceFAZolin  (ANCEF ) IVPB 2g/100 mL premix  Status:  Discontinued        2 g 200 mL/hr over 30 Minutes Intravenous On call to O.R. 05/03/23 1057 05/03/23 1740   05/03/23 0900  cefTRIAXone  (ROCEPHIN ) 2 g in sodium chloride  0.9 % 100 mL IVPB  Status:  Discontinued        2 g 200 mL/hr over 30 Minutes Intravenous Daily 05/03/23 0814 05/04/23 1610       Assessment/Plan: s/p Procedure(s): IRRIGATION AND DEBRIDEMENT WOUND Plan: -Patient was seen and examined with Dr. Waddell today.  Left breast does appear to have improved over the past day or so.  Long discussion was had with the patient with next steps including returning to the OR versus close monitoring over the next 24 hours.  Medical decision making was made with the patient and it was decided that we will continue to monitor the breast closely.  We will make her n.p.o. after midnight in case she does not improve and she has to return to the OR.  Patient was in agreement with this plan.  Encouraged her to eat today and continue to try to ambulate. -Continue with antibiotics per primary team, appreciate their recommendations -Patient to be n.p.o. after midnight tonight.  LOS: 5 days    Estefana FORBES Peck, PA-C 05/07/2023

## 2023-05-07 NOTE — Progress Notes (Signed)
 PROGRESS NOTE    KYLIEE ORTEGO  FMW:[id number] DOB: 1981-02-22 DOA: 05/02/2023 PCP: Sowles, Krichna, MD  Subjective: Pt seen and examined. Left breast remains tender Finally had a BM this AM. Remains on IV Vanco Day #8 and IV Meropenem  Day #3, Flagyl  Day #2    Hospital Course: HPI: LAKELYN STRAUS is a 43 y.o. female with medical history significant for hypertension, hypothyroidism, asthma, depression, anxiety, BMI 42, and symptomatic macromastia s/p bilateral breast reduction surgery on 04/13/2023 who presents with concerns for infection of the left breast.     [facility] ED & Hospital Course: Patient presented to the ED on 04/29/2023 with severe left breast pain, redness, and heat.  She also reported a fever to 101F at home.  She had blood cultures drawn which have been negative to date.  CT in the ED demonstrated loosely organized fluid collections in the bilateral breasts.   She was admitted to the hospitalist service on 04/29/2023, started on Rocephin  and vancomycin , but failed to show any clinical improvement, developed malodorous purulent drainage, and plastic surgeon (Dr. Waddell) recommended transfer to [facility] for likely operative management.    Significant Events: Admitted 05/02/2023 as a transfer from [facility] due to post-op infection of her left breast surgery   Significant Labs:   Significant Imaging Studies:   Antibiotic Therapy: Anti-infectives (From admission, onward)    Start     Dose/Rate Route Frequency Ordered Stop   05/03/23 1300  vancomycin  (VANCOREADY) IVPB 1750 mg/350 mL  Status:  Discontinued        1,750 mg 175 mL/hr over 120 Minutes Intravenous Every 24 hours 05/02/23 2211 05/03/23 1026   05/03/23 1300  Vancomycin  (VANCOCIN ) 1,500 mg in sodium chloride  0.9 % 500 mL IVPB        1,500 mg 250 mL/hr over 120 Minutes Intravenous Every 24 hours 05/03/23 1026 05/06/23 1259   05/03/23 1145  ceFAZolin  (ANCEF ) IVPB 2g/100 mL premix        2 g 200 mL/hr  over 30 Minutes Intravenous On call to O.R. 05/03/23 1057 05/04/23 0559   05/03/23 0900  cefTRIAXone  (ROCEPHIN ) 2 g in sodium chloride  0.9 % 100 mL IVPB        2 g 200 mL/hr over 30 Minutes Intravenous Daily 05/03/23 0814         Procedures:   Consultants: Plastic Surgery    Assessment and Plan: * Post op infection of left breast 05-03-2023 continue with IV rocephin /Vanco. Pt to go to OR today for washout/I&D by plastic surgery Dr. Waddell. 05-04-2023 s/p abscess washout yesterday. Plastic surgery to manage pt's opiate medications. Remains on IV rocephin /vanco. 05-05-2023 IV ABX changed to IV meropenem  yesterday after conversation with Dr. Waddell with plastic surgery. Abx changed due to failure to improve and continue purulent drainage from left breast despite washout and surgical I&D. Intra-op gram stain showing GNR. Concern for possible pseudomonas or other MRDO given lack of improvement on IV Rocephin . Continue IV Vanco for now. Awaiting final ID on intra-op cultures. 05-06-2023 remains on IV meropenem  Day #2, Vanco #7. Completed 5 days of IV rocephin . Intra-op cultures growing CITROBACTER KOSERI. No final sensitivities. Awaiting final cultures. Increase IV morphine  to 2 mg prn. Discussed with ID. Add po flagyl  500 mg bid. DC IV Vanco 05-07-2023 Vanco Day #8, Meropenem  Day #3, Flagyl  Day #2. Completed 5 days of IV rocephin  but was not improving. Plastic surgery to decide on taking back to OR for washout #2 today.  Cellulitis of  left breast 05-03-2023 continue with IV rocephin /Vanco. Pt to go to OR today for washout/I&D by plastic surgery Dr. Waddell. 05-04-2023 s/p abscess washout yesterday. Plastic surgery to manage pt's opiate medications. Remains on IV rocephin /vanco. 05-05-2023 IV ABX changed to IV meropenem  yesterday after conversation with Dr. Waddell with plastic surgery. Abx changed due to failure to improve and continue purulent drainage from left breast despite washout and  surgical I&D. Intra-op gram stain showing GNR. Concern for possible pseudomonas or other MRDO given lack of improvement on IV Rocephin . Continue IV Vanco for now. Awaiting final ID on intra-op cultures.  05-06-2023 remains on IV meropenem  Day #2, Vanco #7. Completed 5 days of IV rocephin . Intra-op cultures growing CITROBACTER KOSERI. No final sensitivities. Awaiting final cultures. Increase IV morphine  to 2 mg prn. Discussed with ID. Add po flagyl  500 mg bid. DC IV Vanco 05-07-2023 Vanco Day #8, Meropenem  Day #3, Flagyl  Day #2. Completed 5 days of IV rocephin  but was not improving. Plastic surgery to decide on taking back to OR for washout #2 today.  Depression with anxiety 05-04-2023 to 05-07-2023 stable. On cymbalta  60 mg daily, buspar  5 mg bid.  Acquired hypothyroidism 05-04-2023 to 05-07-2023 stable on synthroid  100 mcg daily.  Essential hypertension 05-04-2023 to 05-07-2023 stable on 5 mg norvasc   Obesity, Class III, BMI 40-49.9 (morbid obesity) (HCC) BMI 41.4  Allergy-induced asthma, mild intermittent, uncomplicated 05-04-2023 continue with prn albuterol  nebs.  Hyponatremia 05-04-2023 clinically insignificant. Likely due to low solute intake due to abscess/illness. 05-05-2023 awaiting BMP today. 05-06-2023 resolved.  Opioid-induced constipation 05/07/2023 continue movantik  while on po/IV opiates. Continue daily miralax .   DVT prophylaxis: SCDs Start: 05/02/23 2134    Code Status: Full Code Family Communication: pt's brother in bedside recliner Disposition Plan: return home Reason for continuing need for hospitalization: remains on IV Abx.  Objective: Vitals:   05/06/23 1540 05/06/23 2102 05/07/23 0457 05/07/23 0803  BP: 127/79 129/77 115/66 (!) 142/85  Pulse: 89 86 83 84  Resp: 17 18 18 17   Temp: 98.7 F (37.1 C) 98.8 F (37.1 C) 98.6 F (37 C) 98.7 F (37.1 C)  TempSrc: Oral Oral Oral Oral  SpO2: 100% 98% 100% 99%  Weight:      Height:        Intake/Output  Summary (Last 24 hours) at 05/07/2023 1036 Last data filed at 05/07/2023 0931 Gross per 24 hour  Intake 480 ml  Output 50 ml  Net 430 ml   Filed Weights   05/02/23 2239 05/03/23 1419  Weight: 106 kg 105.7 kg    Examination:  Physical Exam Vitals and nursing note reviewed.  Constitutional:      Appearance: She is obese.  HENT:     Head: Normocephalic and atraumatic.     Nose: Nose normal.  Cardiovascular:     Rate and Rhythm: Normal rate and regular rhythm.  Pulmonary:     Effort: Pulmonary effort is normal.     Breath sounds: Normal breath sounds.  Abdominal:     General: Bowel sounds are normal.     Palpations: Abdomen is soft.  Skin:    Capillary Refill: Capillary refill takes less than 2 seconds.     Comments: Left breast upper outer quadrant remains tender and firm.  Neurological:     General: No focal deficit present.     Mental Status: She is alert and oriented to person, place, and time.     Data Reviewed: I have personally reviewed following labs and imaging studies  CBC: Recent Labs  Lab 05/01/23 0516 05/02/23 0617 05/03/23 0556 05/04/23 0318 05/05/23 1048  WBC 13.6* 13.7* 16.2* 14.8* 10.8*  HGB 9.5* 9.3* 10.0* 9.5* 9.8*  HCT 28.6* 28.6* 30.5* 29.3* 30.3*  MCV 83.9 83.4 83.3 84.2 83.5  PLT 455* 478* 510* 495* 529*   Basic Metabolic Panel: Recent Labs  Lab 05/01/23 0516 05/02/23 0617 05/03/23 0556 05/04/23 0318 05/05/23 1048  NA 133* 131* 135 132* 135  K 4.0 3.8 3.9 3.7 3.9  CL 103 101 101 103 106  CO2 23 21* 23 20* 20*  GLUCOSE 113* 113* 102* 141* 115*  BUN 10 9 7 9 9   CREATININE 0.80 0.77 0.96 0.72 0.78  CALCIUM 8.9 8.7* 9.2 8.7* 8.9  MG 2.0 2.0  --   --   --    GFR: Estimated Creatinine Clearance: 105.5 mL/min (by C-G formula based on SCr of 0.78 mg/dL).  Sepsis Labs: Recent Labs  Lab 05/01/23 0516 05/04/23 0318  PROCALCITON <0.10 0.21    Recent Results (from the past 240 hours)  Culture, blood (routine x 2)     Status: None    Collection Time: 04/29/23  6:00 PM   Specimen: BLOOD  Result Value Ref Range Status   Specimen Description BLOOD BLOOD RIGHT HAND  Final   Special Requests   Final    BOTTLES DRAWN AEROBIC AND ANAEROBIC Blood Culture results may not be optimal due to an inadequate volume of blood received in culture bottles   Culture   Final    NO GROWTH 5 DAYS Performed at [facility], [address] [place], [place] [address]    Report Status 05/04/2023 FINAL  Final  Culture, blood (routine x 2)     Status: None   Collection Time: 04/29/23  6:30 PM   Specimen: BLOOD  Result Value Ref Range Status   Specimen Description BLOOD LEFT ANTECUBITAL  Final   Special Requests   Final    BOTTLES DRAWN AEROBIC AND ANAEROBIC Blood Culture results may not be optimal due to an inadequate volume of blood received in culture bottles   Culture   Final    NO GROWTH 5 DAYS Performed at [facility], [address], [place], [place] [address]    Report Status 05/04/2023 FINAL  Final  Aerobic/Anaerobic Culture w Gram Stain (surgical/deep wound)     Status: None (Preliminary result)   Collection Time: 05/03/23  3:56 PM   Specimen: PATH Breast other; Tissue  Result Value Ref Range Status   Specimen Description TISSUE LEFT BREAST  Final   Special Requests PT ON VANCOMYCIN   Final   Gram Stain   Final    RARE WBC PRESENT, PREDOMINANTLY PMN RARE GRAM NEGATIVE RODS    Culture   Final    RARE CITROBACTER KOSERI FEW PREVOTELLA BIVIA BETA LACTAMASE POSITIVE Performed at [facility], [address] [place], [place] [address]    Report Status PENDING  Incomplete     Radiology Studies: No results found.  Scheduled Meds:  amLODipine   5 mg Oral Daily   bisacodyl   10 mg Oral Daily   busPIRone   5 mg Oral BID   chlorhexidine    Topical BID   DULoxetine   60 mg Oral Daily   [START ON 05/08/2023] enoxaparin  (LOVENOX ) injection  50 mg Subcutaneous Q24H   levothyroxine   100 mcg Oral QAC  breakfast   metroNIDAZOLE   500 mg Oral Q12H   naloxegol  oxalate  12.5 mg Oral Daily  polyethylene glycol  34 g Oral Daily   topiramate   100 mg Oral QHS   Continuous Infusions:  cefTRIAXone  (ROCEPHIN )  IV 2 g (05/06/23 1341)   dextrose  5 % and 0.45 % NaCl with KCl 20 mEq/L 75 mL/hr at 05/06/23 2329     LOS: 5 days   Time spent: 40 minutes  Camellia Door, DO  [facility]  05/07/2023, 10:36 AM

## 2023-05-07 NOTE — Plan of Care (Signed)
  Problem: Clinical Measurements: Goal: Cardiovascular complication will be avoided Outcome: Progressing   Problem: Nutrition: Goal: Adequate nutrition will be maintained Outcome: Progressing   Problem: Elimination: Goal: Will not experience complications related to bowel motility Outcome: Progressing Goal: Will not experience complications related to urinary retention Outcome: Progressing

## 2023-05-08 DIAGNOSIS — E039 Hypothyroidism, unspecified: Secondary | ICD-10-CM | POA: Diagnosis not present

## 2023-05-08 DIAGNOSIS — I10 Essential (primary) hypertension: Secondary | ICD-10-CM | POA: Diagnosis not present

## 2023-05-08 DIAGNOSIS — T8142XA Infection following a procedure, deep incisional surgical site, initial encounter: Secondary | ICD-10-CM | POA: Diagnosis not present

## 2023-05-08 DIAGNOSIS — E876 Hypokalemia: Secondary | ICD-10-CM | POA: Insufficient documentation

## 2023-05-08 DIAGNOSIS — N61 Mastitis without abscess: Secondary | ICD-10-CM | POA: Diagnosis not present

## 2023-05-08 LAB — AEROBIC/ANAEROBIC CULTURE W GRAM STAIN (SURGICAL/DEEP WOUND)

## 2023-05-08 LAB — COMPREHENSIVE METABOLIC PANEL
ALT: 42 U/L (ref 0–44)
AST: 48 U/L — ABNORMAL HIGH (ref 15–41)
Albumin: 2.5 g/dL — ABNORMAL LOW (ref 3.5–5.0)
Alkaline Phosphatase: 80 U/L (ref 38–126)
Anion gap: 10 (ref 5–15)
BUN: 14 mg/dL (ref 6–20)
CO2: 19 mmol/L — ABNORMAL LOW (ref 22–32)
Calcium: 8.7 mg/dL — ABNORMAL LOW (ref 8.9–10.3)
Chloride: 108 mmol/L (ref 98–111)
Creatinine, Ser: 0.78 mg/dL (ref 0.44–1.00)
GFR, Estimated: >60 mL/min (ref >60)
Glucose, Bld: 96 mg/dL (ref 70–99)
Potassium: 3.4 mmol/L — ABNORMAL LOW (ref 3.5–5.1)
Sodium: 137 mmol/L (ref 135–145)
Total Bilirubin: 0.4 mg/dL (ref 0.0–1.2)
Total Protein: 7 g/dL (ref 6.5–8.1)

## 2023-05-08 LAB — CBC WITH DIFFERENTIAL/PLATELET
Abs Immature Granulocytes: 0.38 10*3/uL — ABNORMAL HIGH (ref 0.00–0.07)
Basophils Absolute: 0 10*3/uL (ref 0.0–0.1)
Basophils Relative: 0 %
Eosinophils Absolute: 0.6 10*3/uL — ABNORMAL HIGH (ref 0.0–0.5)
Eosinophils Relative: 7 %
HCT: 27.1 % — ABNORMAL LOW (ref 36.0–46.0)
Hemoglobin: 8.7 g/dL — ABNORMAL LOW (ref 12.0–15.0)
Immature Granulocytes: 4 %
Lymphocytes Relative: 28 %
Lymphs Abs: 2.6 10*3/uL (ref 0.7–4.0)
MCH: 27.1 pg (ref 26.0–34.0)
MCHC: 32.1 g/dL (ref 30.0–36.0)
MCV: 84.4 fL (ref 80.0–100.0)
Monocytes Absolute: 0.6 10*3/uL (ref 0.1–1.0)
Monocytes Relative: 7 %
Neutro Abs: 4.8 10*3/uL (ref 1.7–7.7)
Neutrophils Relative %: 54 %
Platelets: 466 10*3/uL — ABNORMAL HIGH (ref 150–400)
RBC: 3.21 MIL/uL — ABNORMAL LOW (ref 3.87–5.11)
RDW: 14.4 % (ref 11.5–15.5)
WBC: 9 10*3/uL (ref 4.0–10.5)
nRBC: 0.3 % — ABNORMAL HIGH (ref 0.0–0.2)

## 2023-05-08 MED ORDER — POLYETHYLENE GLYCOL 3350 17 G PO PACK: 34.0000 g | PACK | Freq: Every day | ORAL | Status: DC | PRN

## 2023-05-08 MED ORDER — POTASSIUM CHLORIDE CRYS ER 20 MEQ PO TBCR
40.0000 meq | EXTENDED_RELEASE_TABLET | Freq: Once | ORAL | Status: AC
  Administered 2023-05-08: 40 meq via ORAL
  Filled 2023-05-08: qty 2

## 2023-05-08 MED ORDER — BISACODYL 5 MG PO TBEC: 10.0000 mg | DELAYED_RELEASE_TABLET | Freq: Every day | ORAL | Status: DC | PRN

## 2023-05-08 MED ORDER — CIPROFLOXACIN HCL 500 MG PO TABS
750.0000 mg | ORAL_TABLET | Freq: Two times a day (BID) | ORAL | Status: DC
  Administered 2023-05-08 – 2023-05-09 (×3): 750 mg via ORAL
  Filled 2023-05-08 (×3): qty 2

## 2023-05-08 NOTE — Progress Notes (Signed)
 Postop day 5 from return to the OR for washout of the left breast due to postoperative wound infection.  Ms. Haefner has made remarkable progress with the addition of Flagyl .  This morning her white blood cell count is normal and the swelling in her left breast is markedly decreased.  She still has tenderness to palpation but this seems to be improving.  I have asked her to increase her activity today.  I have asked her to shower today.  The current binder that she is wearing on her chest is uncomfortable so I have encouraged her to have a family member bring one of her sports bras from home.  She may use this as desired.  I have spoken with Dr. Laurence and discharge on Cipro  and Flagyl  is appropriate.  Will plan for her to be discharged tomorrow morning with follow-up next week.  She will be discharged with the drain and the drain will be removed in the office next week when she sees me.

## 2023-05-08 NOTE — Plan of Care (Signed)
   Problem: Education: Goal: Knowledge of General Education information will improve Description Including pain rating scale, medication(s)/side effects and non-pharmacologic comfort measures Outcome: Progressing   Problem: Clinical Measurements: Goal: Ability to maintain clinical measurements within normal limits will improve Outcome: Progressing   Problem: Clinical Measurements: Goal: Will remain free from infection Outcome: Progressing

## 2023-05-08 NOTE — Progress Notes (Signed)
 PROGRESS NOTE    Catherine Berg  FMW:[id number] DOB: May 16, 1980 DOA: 05/02/2023 PCP: Sowles, Krichna, MD  Subjective: Pt seen and examined. Having regular Bms now. Abx changed over to IV rocephin  yesterday by ID pharmacist based on MIC from citrobacter. Discussed with plastic surgery today. No plans to take pt to OR. Pt is steadily improving since flagyl  was added.  Pt's Prevotella infection in her abscess likely the cause of her non-improvement on Rocephin /Vanco.    Hospital Course: HPI: Catherine Berg is a 43 y.o. female with medical history significant for hypertension, hypothyroidism, asthma, depression, anxiety, BMI 42, and symptomatic macromastia s/p bilateral breast reduction surgery on 04/13/2023 who presents with concerns for infection of the left breast.     [facility] ED & Hospital Course: Patient presented to the ED on 04/29/2023 with severe left breast pain, redness, and heat.  She also reported a fever to 101F at home.  She had blood cultures drawn which have been negative to date.  CT in the ED demonstrated loosely organized fluid collections in the bilateral breasts.   She was admitted to the hospitalist service on 04/29/2023, started on Rocephin  and vancomycin , but failed to show any clinical improvement, developed malodorous purulent drainage, and plastic surgeon (Dr. Waddell) recommended transfer to [facility] for likely operative management.    Significant Events: Admitted 05/02/2023 as a transfer from [facility] due to post-op infection of her left breast surgery   Significant Labs: Intra-op cultures grew Citrobacter and Prevotella  Significant Imaging Studies:   Antibiotic Therapy: Anti-infectives (From admission, onward)    Start     Dose/Rate Route Frequency Ordered Stop   05/03/23 1300  vancomycin  (VANCOREADY) IVPB 1750 mg/350 mL  Status:  Discontinued        1,750 mg 175 mL/hr over 120 Minutes Intravenous Every 24 hours 05/02/23 2211 05/03/23 1026    05/03/23 1300  Vancomycin  (VANCOCIN ) 1,500 mg in sodium chloride  0.9 % 500 mL IVPB        1,500 mg 250 mL/hr over 120 Minutes Intravenous Every 24 hours 05/03/23 1026 05/06/23 1259   05/03/23 1145  ceFAZolin  (ANCEF ) IVPB 2g/100 mL premix        2 g 200 mL/hr over 30 Minutes Intravenous On call to O.R. 05/03/23 1057 05/04/23 0559   05/03/23 0900  cefTRIAXone  (ROCEPHIN ) 2 g in sodium chloride  0.9 % 100 mL IVPB        2 g 200 mL/hr over 30 Minutes Intravenous Daily 05/03/23 0814         Procedures:   Consultants: Plastic Surgery    Assessment and Plan: * Post op infection of left breast 05-03-2023 continue with IV rocephin /Vanco. Pt to go to OR today for washout/I&D by plastic surgery Dr. Waddell. 05-04-2023 s/p abscess washout yesterday. Plastic surgery to manage pt's opiate medications. Remains on IV rocephin /vanco. 05-05-2023 IV ABX changed to IV meropenem  yesterday after conversation with Dr. Waddell with plastic surgery. Abx changed due to failure to improve and continue purulent drainage from left breast despite washout and surgical I&D. Intra-op gram stain showing GNR. Concern for possible pseudomonas or other MRDO given lack of improvement on IV Rocephin . Continue IV Vanco for now. Awaiting final ID on intra-op cultures. 05-06-2023 remains on IV meropenem  Day #2, Vanco #7. Completed 5 days of IV rocephin . Intra-op cultures growing CITROBACTER KOSERI. No final sensitivities. Awaiting final cultures. Increase IV morphine  to 2 mg prn. Discussed with ID. Add po flagyl  500 mg bid. DC IV Vanco 05-07-2023  Vanco Day #8, Meropenem  Day #3, Flagyl  Day #2. Completed 5 days of IV rocephin  but was not improving. Plastic surgery to decide on taking back to OR for washout #2 today. 05-08-2023 discussed with plastic surgery. No plans to take to OR again. Will change to po cipro  750 mg bid. Continue with po flagyl . IV meropenem  stopped yesterday. Completed 3 days of meropenem  and 8 days of Vanco.  Flagyl   Day #3.  If stable tomorrow, consider discharge to home with prolonged course of abx and f/u with plastic surgery.  Cellulitis of left breast 05-03-2023 continue with IV rocephin /Vanco. Pt to go to OR today for washout/I&D by plastic surgery Dr. Waddell. 05-04-2023 s/p abscess washout yesterday. Plastic surgery to manage pt's opiate medications. Remains on IV rocephin /vanco. 05-05-2023 IV ABX changed to IV meropenem  yesterday after conversation with Dr. Waddell with plastic surgery. Abx changed due to failure to improve and continue purulent drainage from left breast despite washout and surgical I&D. Intra-op gram stain showing GNR. Concern for possible pseudomonas or other MRDO given lack of improvement on IV Rocephin . Continue IV Vanco for now. Awaiting final ID on intra-op cultures.  05-06-2023 remains on IV meropenem  Day #2, Vanco #7. Completed 5 days of IV rocephin . Intra-op cultures growing CITROBACTER KOSERI. No final sensitivities. Awaiting final cultures. Increase IV morphine  to 2 mg prn. Discussed with ID. Add po flagyl  500 mg bid. DC IV Vanco 05-07-2023 Vanco Day #8, Meropenem  Day #3, Flagyl  Day #2. Completed 5 days of IV rocephin  but was not improving. Plastic surgery to decide on taking back to OR for washout #2 today. 05-08-2023 discussed with plastic surgery. No plans to take to OR again. Will change to po cipro  750 mg bid. Continue with po flagyl . IV meropenem  stopped yesterday. Completed 3 days of meropenem  and 8 days of Vanco.  Flagyl  Day #3.  If stable tomorrow, consider discharge to home with prolonged course of abx and f/u with plastic surgery.  Depression with anxiety 05-04-2023 to 05-08-2023 stable. On cymbalta  60 mg daily, buspar  5 mg bid.  Acquired hypothyroidism 05-04-2023 to 05-08-2023 stable on synthroid  100 mcg daily.  Essential hypertension 05-04-2023 to 05-08-2023 stable on 5 mg norvasc   Obesity, Class III, BMI 40-49.9 (morbid obesity) (HCC) BMI  41.4  Allergy-induced asthma, mild intermittent, uncomplicated 05-04-2023 continue with prn albuterol  nebs.  Hyponatremia 05-04-2023 clinically insignificant. Likely due to low solute intake due to abscess/illness. 05-05-2023 awaiting BMP today. 05-06-2023 resolved.  Opioid-induced constipation 05/07/2023 continue movantik  while on po/IV opiates. Continue daily miralax . 05-08-2023 resolved. Pt with multiple Bms. Pt trying not to take opiates. Will stop movantik . Change laxatives to prn.   DVT prophylaxis: SCDs Start: 05/02/23 2134     Code Status: Full Code Family Communication: discussed with pt and her brother at bedside Disposition Plan: return home Reason for continuing need for hospitalization: monitor on PO abx. Plan for DC in AM.  Objective: Vitals:   05/07/23 1726 05/07/23 2044 05/08/23 0438 05/08/23 0808  BP: 129/87 (!) 142/77 112/73 126/87  Pulse: 88 90 74 73  Resp: 17 18 18 16   Temp: 98.8 F (37.1 C) 99 F (37.2 C) 98.6 F (37 C) 98.5 F (36.9 C)  TempSrc: Oral Oral Oral Oral  SpO2: 100% 100% 100% 100%  Weight:      Height:        Intake/Output Summary (Last 24 hours) at 05/08/2023 1210 Last data filed at 05/08/2023 0500 Gross per 24 hour  Intake 1206.25 ml  Output 15 ml  Net 1191.25 ml   Filed Weights   05/02/23 2239 05/03/23 1419  Weight: 106 kg 105.7 kg    Examination:  Physical Exam Vitals and nursing note reviewed.  Constitutional:      General: She is not in acute distress.    Appearance: She is obese. She is not toxic-appearing or diaphoretic.  HENT:     Head: Normocephalic and atraumatic.  Eyes:     General: No scleral icterus. Cardiovascular:     Rate and Rhythm: Normal rate and regular rhythm.  Pulmonary:     Effort: Pulmonary effort is normal.     Breath sounds: Normal breath sounds.  Skin:    Capillary Refill: Capillary refill takes less than 2 seconds.     Comments: Left upper outer breast quadrant softer today. Less drainage  in JP bulb  Neurological:     Mental Status: She is alert and oriented to person, place, and time.     Data Reviewed: I have personally reviewed following labs and imaging studies  CBC: Recent Labs  Lab 05/02/23 0617 05/03/23 0556 05/04/23 0318 05/05/23 1048 05/08/23 0447  WBC 13.7* 16.2* 14.8* 10.8* 9.0  NEUTROABS  --   --   --   --  4.8  HGB 9.3* 10.0* 9.5* 9.8* 8.7*  HCT 28.6* 30.5* 29.3* 30.3* 27.1*  MCV 83.4 83.3 84.2 83.5 84.4  PLT 478* 510* 495* 529* 466*   Basic Metabolic Panel: Recent Labs  Lab 05/02/23 0617 05/03/23 0556 05/04/23 0318 05/05/23 1048 05/08/23 0447  NA 131* 135 132* 135 137  K 3.8 3.9 3.7 3.9 3.4*  CL 101 101 103 106 108  CO2 21* 23 20* 20* 19*  GLUCOSE 113* 102* 141* 115* 96  BUN 9 7 9 9 14   CREATININE 0.77 0.96 0.72 0.78 0.78  CALCIUM 8.7* 9.2 8.7* 8.9 8.7*  MG 2.0  --   --   --   --    GFR: Estimated Creatinine Clearance: 105.5 mL/min (by C-G formula based on SCr of 0.78 mg/dL). Liver Function Tests: Recent Labs  Lab 05/08/23 0447  AST 48*  ALT 42  ALKPHOS 80  BILITOT 0.4  PROT 7.0  ALBUMIN 2.5*   Sepsis Labs: Recent Labs  Lab 05/04/23 0318  PROCALCITON 0.21    Recent Results (from the past 240 hours)  Culture, blood (routine x 2)     Status: None   Collection Time: 04/29/23  6:00 PM   Specimen: BLOOD  Result Value Ref Range Status   Specimen Description BLOOD BLOOD RIGHT HAND  Final   Special Requests   Final    BOTTLES DRAWN AEROBIC AND ANAEROBIC Blood Culture results may not be optimal due to an inadequate volume of blood received in culture bottles   Culture   Final    NO GROWTH 5 DAYS Performed at [facility], [address] [place], [place] [address]    Report Status 05/04/2023 FINAL  Final  Culture, blood (routine x 2)     Status: None   Collection Time: 04/29/23  6:30 PM   Specimen: BLOOD  Result Value Ref Range Status   Specimen Description BLOOD LEFT ANTECUBITAL  Final   Special Requests    Final    BOTTLES DRAWN AEROBIC AND ANAEROBIC Blood Culture results may not be optimal due to an inadequate volume of blood received in culture bottles   Culture   Final    NO GROWTH 5 DAYS Performed at [facility], [address] [place], [place] [address]    Report Status 05/04/2023 FINAL  Final  Aerobic/Anaerobic Culture w Gram Stain (surgical/deep wound)     Status: None (Preliminary result)   Collection Time: 05/03/23  3:56 PM   Specimen: PATH Breast other; Tissue  Result Value Ref Range Status   Specimen Description TISSUE LEFT BREAST  Final   Special Requests PT ON VANCOMYCIN   Final   Gram Stain   Final    RARE WBC PRESENT, PREDOMINANTLY PMN RARE GRAM NEGATIVE RODS    Culture   Final    RARE CITROBACTER KOSERI FEW PREVOTELLA BIVIA BETA LACTAMASE POSITIVE    Report Status PENDING  Incomplete   Organism ID, Bacteria CITROBACTER KOSERI  Final      Susceptibility   Citrobacter koseri - MIC*    CEFEPIME <=0.12 SENSITIVE Sensitive     CEFTAZIDIME <=1 SENSITIVE Sensitive     CEFTRIAXONE  <=0.25 SENSITIVE Sensitive     CIPROFLOXACIN  <=0.25 SENSITIVE Sensitive     GENTAMICIN <=1 SENSITIVE Sensitive     IMIPENEM <=0.25 SENSITIVE Sensitive     TRIMETH /SULFA  <=20 SENSITIVE Sensitive     PIP/TAZO Value in next row Sensitive ug/mL     <=4 SENSITIVEPerformed at [facility], [address], [place], [place] [address]    * RARE CITROBACTER KOSERI     Radiology Studies: No results found.  Scheduled Meds:  amLODipine   5 mg Oral Daily   busPIRone   5 mg Oral BID   chlorhexidine    Topical BID   ciprofloxacin   750 mg Oral BID   DULoxetine   60 mg Oral Daily   enoxaparin  (LOVENOX ) injection  50 mg Subcutaneous Q24H   levothyroxine   100 mcg Oral QAC breakfast   metroNIDAZOLE   500 mg Oral Q12H   topiramate   100 mg Oral QHS   Continuous Infusions:   LOS: 6 days   Time spent: 40 minutes  Camellia Door, DO  [facility]  05/08/2023, 12:10 PM

## 2023-05-09 DIAGNOSIS — D62 Acute posthemorrhagic anemia: Secondary | ICD-10-CM | POA: Insufficient documentation

## 2023-05-09 DIAGNOSIS — N61 Mastitis without abscess: Secondary | ICD-10-CM | POA: Diagnosis not present

## 2023-05-09 DIAGNOSIS — I10 Essential (primary) hypertension: Secondary | ICD-10-CM | POA: Diagnosis not present

## 2023-05-09 DIAGNOSIS — E039 Hypothyroidism, unspecified: Secondary | ICD-10-CM | POA: Diagnosis not present

## 2023-05-09 DIAGNOSIS — T8142XA Infection following a procedure, deep incisional surgical site, initial encounter: Secondary | ICD-10-CM | POA: Diagnosis not present

## 2023-05-09 LAB — CBC WITH DIFFERENTIAL/PLATELET
Abs Immature Granulocytes: 0.24 10*3/uL — ABNORMAL HIGH (ref 0.00–0.07)
Basophils Absolute: 0 10*3/uL (ref 0.0–0.1)
Basophils Relative: 0 %
Eosinophils Absolute: 0.8 10*3/uL — ABNORMAL HIGH (ref 0.0–0.5)
Eosinophils Relative: 9 %
HCT: 28 % — ABNORMAL LOW (ref 36.0–46.0)
Hemoglobin: 8.8 g/dL — ABNORMAL LOW (ref 12.0–15.0)
Immature Granulocytes: 3 %
Lymphocytes Relative: 27 %
Lymphs Abs: 2.6 10*3/uL (ref 0.7–4.0)
MCH: 26.8 pg (ref 26.0–34.0)
MCHC: 31.4 g/dL (ref 30.0–36.0)
MCV: 85.4 fL (ref 80.0–100.0)
Monocytes Absolute: 0.6 10*3/uL (ref 0.1–1.0)
Monocytes Relative: 6 %
Neutro Abs: 5.2 10*3/uL (ref 1.7–7.7)
Neutrophils Relative %: 55 %
Platelets: 454 10*3/uL — ABNORMAL HIGH (ref 150–400)
RBC: 3.28 MIL/uL — ABNORMAL LOW (ref 3.87–5.11)
RDW: 14.8 % (ref 11.5–15.5)
WBC: 9.4 10*3/uL (ref 4.0–10.5)
nRBC: 0 % (ref 0.0–0.2)

## 2023-05-09 LAB — PROCALCITONIN: Procalcitonin: <0.1 ng/mL

## 2023-05-09 LAB — COMPREHENSIVE METABOLIC PANEL
ALT: 59 U/L — ABNORMAL HIGH (ref 0–44)
AST: 57 U/L — ABNORMAL HIGH (ref 15–41)
Albumin: 2.5 g/dL — ABNORMAL LOW (ref 3.5–5.0)
Alkaline Phosphatase: 78 U/L (ref 38–126)
Anion gap: 9 (ref 5–15)
BUN: 16 mg/dL (ref 6–20)
CO2: 19 mmol/L — ABNORMAL LOW (ref 22–32)
Calcium: 8.8 mg/dL — ABNORMAL LOW (ref 8.9–10.3)
Chloride: 109 mmol/L (ref 98–111)
Creatinine, Ser: 0.8 mg/dL (ref 0.44–1.00)
GFR, Estimated: >60 mL/min (ref >60)
Glucose, Bld: 108 mg/dL — ABNORMAL HIGH (ref 70–99)
Potassium: 4 mmol/L (ref 3.5–5.1)
Sodium: 137 mmol/L (ref 135–145)
Total Bilirubin: 0.2 mg/dL (ref 0.0–1.2)
Total Protein: 6.9 g/dL (ref 6.5–8.1)

## 2023-05-09 LAB — MAGNESIUM: Magnesium: 2 mg/dL (ref 1.7–2.4)

## 2023-05-09 MED ORDER — ONDANSETRON HCL 4 MG PO TABS: 4.0000 mg | ORAL_TABLET | Freq: Four times a day (QID) | ORAL | 0 refills | Status: DC | PRN

## 2023-05-09 MED ORDER — METRONIDAZOLE 500 MG PO TABS: 500.0000 mg | ORAL_TABLET | Freq: Two times a day (BID) | ORAL | 0 refills | Status: AC

## 2023-05-09 MED ORDER — BISACODYL 5 MG PO TBEC: 5.0000 mg | DELAYED_RELEASE_TABLET | Freq: Every day | ORAL | 1 refills | Status: AC | PRN

## 2023-05-09 MED ORDER — OXYCODONE-ACETAMINOPHEN 5-325 MG PO TABS: 1.0000 | ORAL_TABLET | ORAL | 0 refills | Status: AC | PRN

## 2023-05-09 MED ORDER — FERROUS SULFATE 325 (65 FE) MG PO TBEC: 325.0000 mg | DELAYED_RELEASE_TABLET | Freq: Two times a day (BID) | ORAL | 0 refills | Status: DC

## 2023-05-09 MED ORDER — FLUCONAZOLE 150 MG PO TABS
150.0000 mg | ORAL_TABLET | Freq: Once | ORAL | Status: AC
  Administered 2023-05-09: 150 mg via ORAL
  Filled 2023-05-09: qty 1

## 2023-05-09 MED ORDER — FLUCONAZOLE 150 MG PO TABS: 150.0000 mg | ORAL_TABLET | ORAL | 0 refills | Status: AC

## 2023-05-09 MED ORDER — CIPROFLOXACIN HCL 750 MG PO TABS: 750.0000 mg | ORAL_TABLET | Freq: Two times a day (BID) | ORAL | 0 refills | Status: AC

## 2023-05-09 MED ORDER — DULOXETINE HCL 60 MG PO CPEP: 60.0000 mg | ORAL_CAPSULE | Freq: Every day | ORAL | 0 refills | Status: DC

## 2023-05-09 NOTE — Plan of Care (Signed)
 ?  Problem: Education: ?Goal: Knowledge of General Education information will improve ?Description: Including pain rating scale, medication(s)/side effects and non-pharmacologic comfort measures ?Outcome: Progressing ?  ?Problem: Health Behavior/Discharge Planning: ?Goal: Ability to manage health-related needs will improve ?Outcome: Progressing ?  ?Problem: Coping: ?Goal: Level of anxiety will decrease ?Outcome: Progressing ?  ?

## 2023-05-09 NOTE — Progress Notes (Signed)
 6 Days Post-Op  Subjective: Patient is a pleasant 43 year old female with history of breast reduction surgery performed 04/13/2023 by Dr. Waddell complicated by left breast infection s/p OR washout 05/03/2023 who has remained hospitalized for IV antibiotics.  She has continued to improve during her hospitalization and a.m. labs demonstrate stable hemoglobin and no persistent leukocytosis.  Primary team contacted ID who recommends transition to oral ciprofloxacin  and metronidazole  given intraoperative cultures revealing Citrobacter koseri.    Today, patient is resting comfortably in bed.  She is tolerating p.o. intake without difficulty.  Voiding.  Ambulatory to the bathroom independently.  Mild pain overnight, but controlled.  Her primary concern is her drain which she understands will be in place for at least a few more days.  She denies any leg swelling, ongoing fevers, or other symptoms.  Objective: Vital signs in last 24 hours: Temp:  [98.2 F (36.8 C)-98.6 F (37 C)] 98.6 F (37 C) (01/05 0500) Pulse Rate:  [71-78] 78 (01/05 0500) Resp:  [17-18] 18 (01/05 0500) BP: (108-124)/(68-69) 122/69 (01/05 0500) SpO2:  [96 %-100 %] 100 % (01/05 0500) Last BM Date : 05/07/23  Intake/Output from previous day: 01/04 0701 - 01/05 0700 In: 240 [P.O.:240] Out: 920 [Urine:900; Drains:20] Intake/Output this shift: No intake/output data recorded.  General appearance: alert, cooperative, and no distress Breasts: Left breast is soft.  Palpable drain tube.  No significant erythema or induration otherwise concerning for ongoing cellulitis.  Small, 1 x 2 cm inferior T zone wound.  Inframammary incision.  With Prolene sutures.  Drain intact and functional, cloudy output in tubing bulb.  Right breast is soft and healing appropriately, 2 x 1 cm inferior T zone wound that is largely superficial with good granulation.  No evidence concerning for infection. Extremities: No lower extremity swelling or edema.  SCDs  not in place given that she is up and ambulatory.  Lab Results:     Latest Ref Rng & Units 05/09/2023    6:18 AM 05/08/2023    4:47 AM 05/05/2023   10:48 AM  CBC  WBC 4.0 - 10.5 K/uL 9.4  9.0  10.8   Hemoglobin 12.0 - 15.0 g/dL 8.8  8.7  9.8   Hematocrit 36.0 - 46.0 % 28.0  27.1  30.3   Platelets 150 - 400 K/uL 454  466  529     BMET Recent Labs    05/08/23 0447 05/09/23 0618  NA 137 137  K 3.4* 4.0  CL 108 109  CO2 19* 19*  GLUCOSE 96 108*  BUN 14 16  CREATININE 0.78 0.80  CALCIUM 8.7* 8.8*   PT/INR No results for input(s): LABPROT, INR in the last 72 hours. ABG No results for input(s): PHART, HCO3 in the last 72 hours.  Invalid input(s): PCO2, PO2  Studies/Results: No results found.  Anti-infectives: Anti-infectives (From admission, onward)    Start     Dose/Rate Route Frequency Ordered Stop   05/09/23 0915  fluconazole  (DIFLUCAN ) tablet 150 mg        150 mg Oral  Once 05/09/23 0822 05/09/23 0908   05/08/23 1115  ciprofloxacin  (CIPRO ) tablet 750 mg        750 mg Oral 2 times daily 05/08/23 1017     05/06/23 1415  meropenem  (MERREM ) 1 g in sodium chloride  0.9 % 100 mL IVPB  Status:  Discontinued        1 g 200 mL/hr over 30 Minutes Intravenous Every 8 hours 05/06/23 1318 05/06/23 1323  05/06/23 1415  cefTRIAXone  (ROCEPHIN ) 2 g in sodium chloride  0.9 % 100 mL IVPB  Status:  Discontinued        2 g 200 mL/hr over 30 Minutes Intravenous Every 24 hours 05/06/23 1323 05/08/23 1017   05/06/23 1145  metroNIDAZOLE  (FLAGYL ) tablet 500 mg        500 mg Oral Every 12 hours 05/06/23 1048     05/05/23 1000  Vancomycin  (VANCOCIN ) 1,500 mg in sodium chloride  0.9 % 500 mL IVPB  Status:  Discontinued        1,500 mg 250 mL/hr over 120 Minutes Intravenous Every 24 hours 05/04/23 1120 05/06/23 1237   05/04/23 1800  meropenem  (MERREM ) 2 g in sodium chloride  0.9 % 100 mL IVPB  Status:  Discontinued        2 g 280 mL/hr over 30 Minutes Intravenous Every 8 hours 05/04/23  1657 05/06/23 1318   05/04/23 1700  meropenem  (MERREM ) 500 mg in sodium chloride  0.9 % 100 mL IVPB  Status:  Discontinued        500 mg 200 mL/hr over 30 Minutes Intravenous Every 8 hours 05/04/23 1610 05/04/23 1657   05/03/23 1300  vancomycin  (VANCOREADY) IVPB 1750 mg/350 mL  Status:  Discontinued        1,750 mg 175 mL/hr over 120 Minutes Intravenous Every 24 hours 05/02/23 2211 05/03/23 1026   05/03/23 1300  Vancomycin  (VANCOCIN ) 1,500 mg in sodium chloride  0.9 % 500 mL IVPB  Status:  Discontinued        1,500 mg 250 mL/hr over 120 Minutes Intravenous Every 24 hours 05/03/23 1026 05/04/23 1120   05/03/23 1145  ceFAZolin  (ANCEF ) IVPB 2g/100 mL premix  Status:  Discontinued        2 g 200 mL/hr over 30 Minutes Intravenous On call to O.R. 05/03/23 1057 05/03/23 1740   05/03/23 0900  cefTRIAXone  (ROCEPHIN ) 2 g in sodium chloride  0.9 % 100 mL IVPB  Status:  Discontinued        2 g 200 mL/hr over 30 Minutes Intravenous Daily 05/03/23 0814 05/04/23 1610       Assessment/Plan: s/p Procedure(s): IRRIGATION AND DEBRIDEMENT WOUND    LOS: 7 days   Breast reduction surgery complicated by left breast infection s/p OR washout:  Patient's pain has improved during her hospitalization and her left breast is no longer overtly infected.  Leukocytosis resolved.  Hemoglobin stable.  Will prescribe ferrous sulfate  for asymptomatic ABLA secondary to her breast reduction surgery and subsequent washout.    Continued outpatient oral antibiotics per primary team.  Shortened the drain per patient request.  Recommended continued 24/7 compressive garments.  Follow-up with our office on Wednesday for consideration of drain removal.  She can call the office should she have any questions or concerns in the interim.  Catherine Berg, Catherine Berg 05/09/2023

## 2023-05-09 NOTE — Discharge Summary (Signed)
 [facility] Physician Discharge Summary   Patient name: Catherine Berg  Admit date:     05/02/2023  Discharge date: 05/09/2023  Attending Physician: CHARLTON EVALENE RAMAN [[id number]]  Discharge Physician: Camellia Door   PCP: Sowles, Krichna, MD  Admitted From: Home  Disposition:  Home  Recommendations for Outpatient Follow-up:  Follow up with PCP in 1-2 weeks Follow up with Plastic Surgery this Wednesday  Home Health:No Equipment/Devices: None  Discharge Condition:Stable CODE STATUS:FULL Diet recommendation: Heart Healthy Fluid Restriction: None  Hospital Summary: HPI: Catherine Berg is a 43 y.o. female with medical history significant for hypertension, hypothyroidism, asthma, depression, anxiety, BMI 42, and symptomatic macromastia s/p bilateral breast reduction surgery on 04/13/2023 who presents with concerns for infection of the left breast.     [facility] ED & Hospital Course: Patient presented to the ED on 04/29/2023 with severe left breast pain, redness, and heat.  She also reported a fever to 101F at home.  She had blood cultures drawn which have been negative to date.  CT in the ED demonstrated loosely organized fluid collections in the bilateral breasts.   She was admitted to the hospitalist service on 04/29/2023, started on Rocephin  and vancomycin , but failed to show any clinical improvement, developed malodorous purulent drainage, and plastic surgeon (Dr. Waddell) recommended transfer to [facility] for likely operative management.    Significant Events: Admitted 05/02/2023 as a transfer from [facility] due to post-op infection of her left breast surgery   Significant Labs: Intra-op cultures grew Citrobacter and Prevotella  Significant Imaging Studies:   Antibiotic Therapy: Anti-infectives (From admission, onward)    Start     Dose/Rate Route Frequency Ordered Stop   05/03/23 1300  vancomycin  (VANCOREADY) IVPB 1750 mg/350 mL  Status:  Discontinued        1,750  mg 175 mL/hr over 120 Minutes Intravenous Every 24 hours 05/02/23 2211 05/03/23 1026   05/03/23 1300  Vancomycin  (VANCOCIN ) 1,500 mg in sodium chloride  0.9 % 500 mL IVPB        1,500 mg 250 mL/hr over 120 Minutes Intravenous Every 24 hours 05/03/23 1026 05/06/23 1259   05/03/23 1145  ceFAZolin  (ANCEF ) IVPB 2g/100 mL premix        2 g 200 mL/hr over 30 Minutes Intravenous On call to O.R. 05/03/23 1057 05/04/23 0559   05/03/23 0900  cefTRIAXone  (ROCEPHIN ) 2 g in sodium chloride  0.9 % 100 mL IVPB        2 g 200 mL/hr over 30 Minutes Intravenous Daily 05/03/23 0814         Procedures:   Consultants: Plastic Surgery   Hospital Course by Problem: * Post op infection of left breast 05-03-2023 continue with IV rocephin /Vanco. Pt to go to OR today for washout/I&D by plastic surgery Dr. Waddell. 05-04-2023 s/p abscess washout yesterday. Plastic surgery to manage pt's opiate medications. Remains on IV rocephin /vanco. 05-05-2023 IV ABX changed to IV meropenem  yesterday after conversation with Dr. Waddell with plastic surgery. Abx changed due to failure to improve and continue purulent drainage from left breast despite washout and surgical I&D. Intra-op gram stain showing GNR. Concern for possible pseudomonas or other MRDO given lack of improvement on IV Rocephin . Continue IV Vanco for now. Awaiting final ID on intra-op cultures. 05-06-2023 remains on IV meropenem  Day #2, Vanco #7. Completed 5 days of IV rocephin . Intra-op cultures growing CITROBACTER KOSERI. No final sensitivities. Awaiting final cultures. Increase IV morphine  to 2 mg prn. Discussed with ID. Add po flagyl   500 mg bid. DC IV Vanco 05-07-2023 Vanco Day #8, Meropenem  Day #3, Flagyl  Day #2. Completed 5 days of IV rocephin  but was not improving. Plastic surgery to decide on taking back to OR for washout #2 today. 05-08-2023 discussed with plastic surgery. No plans to take to OR again. Will change to po cipro  750 mg bid. Continue with po  flagyl . IV meropenem  stopped yesterday. Completed 3 days of meropenem  and 8 days of Vanco.  Flagyl  Day #3.  If stable tomorrow, consider discharge to home with prolonged course of abx and f/u with plastic surgery. 05-09-2023 cleared for discharge by plastic surgery. Pt requesting po diflucan  for vaginal candidiasis. Dc to home with 2 weeks of cipro  and flagyl . Pt to f/u with plastic surgery this week.  Cellulitis of left breast 05-03-2023 continue with IV rocephin /Vanco. Pt to go to OR today for washout/I&D by plastic surgery Dr. Waddell. 05-04-2023 s/p abscess washout yesterday. Plastic surgery to manage pt's opiate medications. Remains on IV rocephin /vanco. 05-05-2023 IV ABX changed to IV meropenem  yesterday after conversation with Dr. Waddell with plastic surgery. Abx changed due to failure to improve and continue purulent drainage from left breast despite washout and surgical I&D. Intra-op gram stain showing GNR. Concern for possible pseudomonas or other MRDO given lack of improvement on IV Rocephin . Continue IV Vanco for now. Awaiting final ID on intra-op cultures.  05-06-2023 remains on IV meropenem  Day #2, Vanco #7. Completed 5 days of IV rocephin . Intra-op cultures growing CITROBACTER KOSERI. No final sensitivities. Awaiting final cultures. Increase IV morphine  to 2 mg prn. Discussed with ID. Add po flagyl  500 mg bid. DC IV Vanco 05-07-2023 Vanco Day #8, Meropenem  Day #3, Flagyl  Day #2. Completed 5 days of IV rocephin  but was not improving. Plastic surgery to decide on taking back to OR for washout #2 today. 05-08-2023 discussed with plastic surgery. No plans to take to OR again. Will change to po cipro  750 mg bid. Continue with po flagyl . IV meropenem  stopped yesterday. Completed 3 days of meropenem  and 8 days of Vanco.  Flagyl  Day #3.  If stable tomorrow, consider discharge to home with prolonged course of abx and f/u with plastic surgery.  05-09-2023 cleared for discharge by plastic surgery. Pt  requesting po diflucan  for vaginal candidiasis. Dc to home with 2 weeks of cipro  and flagyl . Pt to f/u with plastic surgery this week.   Depression with anxiety 05-04-2023 to 05-08-2023 stable. On cymbalta  60 mg daily, buspar  5 mg bid.  Acquired hypothyroidism 05-04-2023 to 05-08-2023 stable on synthroid  100 mcg daily.  Essential hypertension 05-04-2023 to 05-08-2023 stable on 5 mg norvasc   Obesity, Class III, BMI 40-49.9 (morbid obesity) (HCC) BMI 41.4  Allergy-induced asthma, mild intermittent, uncomplicated 05-04-2023 continue with prn albuterol  nebs.  Acute postoperative anemia due to expected blood loss DC to home with ferrous sulfate .  Hypokalemia Repleted with po kcl and resolved.  Hyponatremia 05-04-2023 clinically insignificant. Likely due to low solute intake due to abscess/illness. 05-05-2023 awaiting BMP today. 05-06-2023 resolved.  Opioid-induced constipation 05/07/2023 continue movantik  while on po/IV opiates. Continue daily miralax . 05-08-2023 resolved. Pt with multiple Bms. Pt trying not to take opiates. Will stop movantik . Change laxatives to prn.    Discharge Diagnoses:  Principal Problem:   Post op infection of left breast Active Problems:   Cellulitis of left breast   Allergy-induced asthma, mild intermittent, uncomplicated   Obesity, Class III, BMI 40-49.9 (morbid obesity) (HCC)   Essential hypertension   Acquired hypothyroidism   Depression  with anxiety   Opioid-induced constipation   Hyponatremia   Hypokalemia   Acute postoperative anemia due to expected blood loss   Discharge Instructions  Discharge Instructions     Call MD for:  difficulty breathing, headache or visual disturbances   Complete by: As directed    Call MD for:  extreme fatigue   Complete by: As directed    Call MD for:  hives   Complete by: As directed    Call MD for:  persistant dizziness or light-headedness   Complete by: As directed    Call MD for:  persistant  nausea and vomiting   Complete by: As directed    Call MD for:  redness, tenderness, or signs of infection (pain, swelling, redness, odor or green/yellow discharge around incision site)   Complete by: As directed    Call MD for:  severe uncontrolled pain   Complete by: As directed    Call MD for:  temperature >100.4   Complete by: As directed    Diet - low sodium heart healthy   Complete by: As directed    Discharge instructions   Complete by: As directed    1. Follow up with Plastic Surgery(Dr. Waddell) as scheduled this Wednesday, May 12, 2023. The office will call you with appointment time.  2. Follow up with your primary care provider in 1-2 weeks after discharge from hospital.   Discharge wound care:   Complete by: As directed    1. Wash both breast surgical incision sites with warm water  and soap once a day. Pat dry. Do NOT rub dry. Cover with dry, clean bandage and tape. 2. Wear a sports bra for breast support.   Increase activity slowly   Complete by: As directed       Allergies as of 05/09/2023       Reactions   Latex Hives   (Balloons, condoms, underwear elastic, gloves)   Shellfish Allergy Hives        Medication List     STOP taking these medications    cefTRIAXone  2 g in sodium chloride  0.9 % 100 mL   oxyCODONE  5 MG immediate release tablet Commonly known as: Oxy IR/ROXICODONE    vancomycin  1250 MG/250ML Soln Commonly known as: VANCOREADY       TAKE these medications    amLODipine  5 MG tablet Commonly known as: NORVASC  Take 1 tablet (5 mg total) by mouth daily.   bisacodyl  5 MG EC tablet Commonly known as: Dulcolax Take 1 tablet (5 mg total) by mouth daily as needed for moderate constipation.   busPIRone  5 MG tablet Commonly known as: BUSPAR  Take 5 mg by mouth 2 (two) times daily.   ciprofloxacin  750 MG tablet Commonly known as: CIPRO  Take 1 tablet (750 mg total) by mouth 2 (two) times daily for 14 days.   Dulera  200-5 MCG/ACT Aero Generic  drug: mometasone -formoterol  Inhale 2 puffs into the lungs in the morning and at bedtime.   DULoxetine  60 MG capsule Commonly known as: CYMBALTA  Take 1 capsule (60 mg total) by mouth daily. What changed: additional instructions   ferrous sulfate  325 (65 FE) MG EC tablet Take 1 tablet (325 mg total) by mouth 2 (two) times daily for 10 days.   fluconazole  150 MG tablet Commonly known as: Diflucan  Take 1 tablet (150 mg total) by mouth every 3 (three) days for 5 doses.   hydrOXYzine  10 MG tablet Commonly known as: ATARAX  Take 1 tablet (10 mg total) by mouth daily as  needed for anxiety.   levothyroxine  100 MCG tablet Commonly known as: SYNTHROID  Take 100 mcg by mouth daily before breakfast.   linaclotide  72 MCG capsule Commonly known as: Linzess  Take 1 capsule (72 mcg total) by mouth daily before breakfast.   metFORMIN  500 MG tablet Commonly known as: GLUCOPHAGE  Take 1 tablet (500 mg total) by mouth daily with breakfast.   metroNIDAZOLE  500 MG tablet Commonly known as: FLAGYL  Take 1 tablet (500 mg total) by mouth every 12 (twelve) hours for 14 days.   montelukast  10 MG tablet Commonly known as: SINGULAIR  Take 1 tablet (10 mg total) by mouth at bedtime.   omeprazole  20 MG capsule Commonly known as: PRILOSEC Take 1 capsule (20 mg total) by mouth 2 (two) times daily before a meal.   ondansetron  4 MG tablet Commonly known as: ZOFRAN  Take 1 tablet (4 mg total) by mouth every 6 (six) hours as needed for nausea.   oxyCODONE -acetaminophen  5-325 MG tablet Commonly known as: Percocet Take 1-2 tablets by mouth every 4 (four) hours as needed for up to 7 days for moderate pain (pain score 4-6) or severe pain (pain score 7-10). What changed: reasons to take this   topiramate  100 MG tablet Commonly known as: Topamax  Take 1 tablet (100 mg total) by mouth at bedtime.   traZODone  50 MG tablet Commonly known as: DESYREL  Take 1 tablet (50 mg total) by mouth at bedtime as needed for  sleep.   Ventolin  HFA 108 (90 Base) MCG/ACT inhaler Generic drug: albuterol  INHALE 2 PUFFS BY MOUTH EVERY 6 HOURS AS NEEDED FOR WHEEZING OR SHORTNESS OF BREATH   Vitamin D  (Ergocalciferol ) 1.25 MG (50000 UNIT) Caps capsule Commonly known as: DRISDOL  Take 1 capsule (50,000 Units total) by mouth every 7 (seven) days.               Discharge Care Instructions  (From admission, onward)           Start     Ordered   05/09/23 0000  Discharge wound care:       Comments: 1. Wash both breast surgical incision sites with warm water  and soap once a day. Pat dry. Do NOT rub dry. Cover with dry, clean bandage and tape. 2. Wear a sports bra for breast support.   05/09/23 0958            Allergies  Allergen Reactions   Latex Hives    (Balloons, condoms, underwear elastic, gloves)   Shellfish Allergy Hives    Discharge Exam: Vitals:   05/08/23 2100 05/09/23 0500  BP: 108/69 122/69  Pulse: 76 78  Resp: 18 18  Temp: 98.6 F (37 C) 98.6 F (37 C)  SpO2: 100% 100%    Physical Exam Vitals and nursing note reviewed.  Constitutional:      Appearance: She is obese.  HENT:     Head: Normocephalic and atraumatic.     Nose: Nose normal.  Pulmonary:     Effort: Pulmonary effort is normal. No respiratory distress.  Skin:    Capillary Refill: Capillary refill takes less than 2 seconds.     Comments: Good granulation tissue infra-areola on right breast  upper outer quadrant left breast is soft.  Neurological:     Mental Status: She is alert and oriented to person, place, and time.     The results of significant diagnostics from this hospitalization (including imaging, microbiology, ancillary and laboratory) are listed below for reference.    Microbiology: Recent Results (from the  past 240 hours)  Culture, blood (routine x 2)     Status: None   Collection Time: 04/29/23  6:00 PM   Specimen: BLOOD  Result Value Ref Range Status   Specimen Description BLOOD BLOOD  RIGHT HAND  Final   Special Requests   Final    BOTTLES DRAWN AEROBIC AND ANAEROBIC Blood Culture results may not be optimal due to an inadequate volume of blood received in culture bottles   Culture   Final    NO GROWTH 5 DAYS Performed at [facility], [address] [place], [place] [address]    Report Status 05/04/2023 FINAL  Final  Culture, blood (routine x 2)     Status: None   Collection Time: 04/29/23  6:30 PM   Specimen: BLOOD  Result Value Ref Range Status   Specimen Description BLOOD LEFT ANTECUBITAL  Final   Special Requests   Final    BOTTLES DRAWN AEROBIC AND ANAEROBIC Blood Culture results may not be optimal due to an inadequate volume of blood received in culture bottles   Culture   Final    NO GROWTH 5 DAYS Performed at [facility], [address], [place], [place] [address]    Report Status 05/04/2023 FINAL  Final  Aerobic/Anaerobic Culture w Gram Stain (surgical/deep wound)     Status: None   Collection Time: 05/03/23  3:56 PM   Specimen: PATH Breast other; Tissue  Result Value Ref Range Status   Specimen Description TISSUE LEFT BREAST  Final   Special Requests PT ON VANCOMYCIN   Final   Gram Stain   Final    RARE WBC PRESENT, PREDOMINANTLY PMN RARE GRAM NEGATIVE RODS    Culture   Final    RARE CITROBACTER KOSERI FEW PREVOTELLA BIVIA BETA LACTAMASE POSITIVE Performed at [facility], [address] [place], [place] [address]    Report Status 05/08/2023 FINAL  Final   Organism ID, Bacteria CITROBACTER KOSERI  Final      Susceptibility   Citrobacter koseri - MIC*    CEFEPIME <=0.12 SENSITIVE Sensitive     CEFTAZIDIME <=1 SENSITIVE Sensitive     CEFTRIAXONE  <=0.25 SENSITIVE Sensitive     CIPROFLOXACIN  <=0.25 SENSITIVE Sensitive     GENTAMICIN <=1 SENSITIVE Sensitive     IMIPENEM <=0.25 SENSITIVE Sensitive     TRIMETH /SULFA  <=20 SENSITIVE Sensitive     PIP/TAZO <=4 SENSITIVE Sensitive ug/mL    * RARE CITROBACTER KOSERI      Labs:  Basic Metabolic Panel: Recent Labs  Lab 05/03/23 0556 05/04/23 0318 05/05/23 1048 05/08/23 0447 05/09/23 0618  NA 135 132* 135 137 137  K 3.9 3.7 3.9 3.4* 4.0  CL 101 103 106 108 109  CO2 23 20* 20* 19* 19*  GLUCOSE 102* 141* 115* 96 108*  BUN 7 9 9 14 16   CREATININE 0.96 0.72 0.78 0.78 0.80  CALCIUM 9.2 8.7* 8.9 8.7* 8.8*  MG  --   --   --   --  2.0   Liver Function Tests: Recent Labs  Lab 05/08/23 0447 05/09/23 0618  AST 48* 57*  ALT 42 59*  ALKPHOS 80 78  BILITOT 0.4 0.2  PROT 7.0 6.9  ALBUMIN 2.5* 2.5*   CBC: Recent Labs  Lab 05/03/23 0556 05/04/23 0318 05/05/23 1048 05/08/23 0447 05/09/23 0618  WBC 16.2* 14.8* 10.8* 9.0 9.4  NEUTROABS  --   --   --  4.8 5.2  HGB 10.0* 9.5* 9.8* 8.7* 8.8*  HCT  30.5* 29.3* 30.3* 27.1* 28.0*  MCV 83.3 84.2 83.5 84.4 85.4  PLT 510* 495* 529* 466* 454*   Sepsis Labs Recent Labs  Lab 05/04/23 0318 05/05/23 1048 05/08/23 0447 05/09/23 0618  WBC 14.8* 10.8* 9.0 9.4   Procalcitonin Lab Results  Component Value Date/Time   PROCALCITON <0.10 05/09/2023 06:18 AM     Microbiology Recent Results (from the past 240 hours)  Culture, blood (routine x 2)     Status: None   Collection Time: 04/29/23  6:00 PM   Specimen: BLOOD  Result Value Ref Range Status   Specimen Description BLOOD BLOOD RIGHT HAND  Final   Special Requests   Final    BOTTLES DRAWN AEROBIC AND ANAEROBIC Blood Culture results may not be optimal due to an inadequate volume of blood received in culture bottles   Culture   Final    NO GROWTH 5 DAYS Performed at [facility], [address] [place], [place] [address]    Report Status 05/04/2023 FINAL  Final  Culture, blood (routine x 2)     Status: None   Collection Time: 04/29/23  6:30 PM   Specimen: BLOOD  Result Value Ref Range Status   Specimen Description BLOOD LEFT ANTECUBITAL  Final   Special Requests   Final    BOTTLES DRAWN AEROBIC AND ANAEROBIC Blood Culture results  may not be optimal due to an inadequate volume of blood received in culture bottles   Culture   Final    NO GROWTH 5 DAYS Performed at [facility], [address] [place], [place] [address]    Report Status 05/04/2023 FINAL  Final  Aerobic/Anaerobic Culture w Gram Stain (surgical/deep wound)     Status: None   Collection Time: 05/03/23  3:56 PM   Specimen: PATH Breast other; Tissue  Result Value Ref Range Status   Specimen Description TISSUE LEFT BREAST  Final   Special Requests PT ON VANCOMYCIN   Final   Gram Stain   Final    RARE WBC PRESENT, PREDOMINANTLY PMN RARE GRAM NEGATIVE RODS    Culture   Final    RARE CITROBACTER KOSERI FEW PREVOTELLA BIVIA BETA LACTAMASE POSITIVE Performed at [facility], [address], [place], [place] [address]    Report Status 05/08/2023 FINAL  Final   Organism ID, Bacteria CITROBACTER KOSERI  Final      Susceptibility   Citrobacter koseri - MIC*    CEFEPIME <=0.12 SENSITIVE Sensitive     CEFTAZIDIME <=1 SENSITIVE Sensitive     CEFTRIAXONE  <=0.25 SENSITIVE Sensitive     CIPROFLOXACIN  <=0.25 SENSITIVE Sensitive     GENTAMICIN <=1 SENSITIVE Sensitive     IMIPENEM <=0.25 SENSITIVE Sensitive     TRIMETH /SULFA  <=20 SENSITIVE Sensitive     PIP/TAZO <=4 SENSITIVE Sensitive ug/mL    * RARE CITROBACTER KOSERI    Procedures/Studies: CT Chest W Contrast Result Date: 04/29/2023 CLINICAL DATA:  Soft tissue infection suspected, left breast postop from reduction 2 weeks ago with increased swelling, induration, erythema eval for fluid collection versus cellulitis EXAM: CT CHEST WITH CONTRAST TECHNIQUE: Multidetector CT imaging of the chest was performed during intravenous contrast administration. RADIATION DOSE REDUCTION: This exam was performed according to the departmental dose-optimization program which includes automated exposure control, adjustment of the mA and/or kV according to patient size and/or use of iterative reconstruction  technique. CONTRAST:  OMNIPAQUE  IOHEXOL  300 MG/ML  SOLN COMPARISON:  CT 09/20/2020 FINDINGS: Cardiovascular: Normal heart size. No pericardial effusion. Normal  caliber thoracic aorta. Mediastinum/Nodes: Trachea and esophagus are unremarkable. Lungs/Pleura: Mild bibasilar atelectasis. The lungs are otherwise clear. No pleural effusion or pneumothorax. Upper Abdomen: No acute abnormality. Musculoskeletal: Multiple loosely organized fluid collections in the left breast. The largest is in the lateral left breast and measures 3.6 x 3.3 x 11.5 cm (series 2/image 70 and 5/61). A tract of fluid from the fluid collection extends into the subareolar region and into the medial breast where there is a smaller 2.6 cm collection. Within the lateral right breast there is a 3.0 x 1.7 x 3.0 cm fluid collection. Fluid extends medially to the subareolar space. There is adjacent fat stranding about the fluid collections in both breasts. No acute fracture. IMPRESSION: Loosely organized fluid in both breasts, greater on the left, likely represents postoperative seromas/hematomas. Superimposed infection is not excluded by imaging. Electronically Signed   By: Norman Gatlin M.D.   On: 04/29/2023 19:23    Time coordinating discharge: 50 mins  SIGNED:  Camellia Door, DO [facility] 05/09/23, 10:04 AM

## 2023-05-09 NOTE — Assessment & Plan Note (Signed)
 DC to home with ferrous sulfate.

## 2023-05-09 NOTE — Progress Notes (Signed)
 PROGRESS NOTE    Catherine Berg  FMW:[id number] DOB: 1980/06/17 DOA: 05/02/2023 PCP: Sowles, Krichna, MD  Subjective: Pt seen and examined. Having regular Bms now. Abx changed over to IV rocephin  yesterday by ID pharmacist based on MIC from citrobacter. Discussed with plastic surgery today. No plans to take pt to OR. Pt is steadily improving since flagyl  was added.  Pt's Prevotella infection in her abscess likely the cause of her non-improvement on Rocephin /Vanco.    Hospital Course: HPI: Catherine Berg is a 43 y.o. female with medical history significant for hypertension, hypothyroidism, asthma, depression, anxiety, BMI 42, and symptomatic macromastia s/p bilateral breast reduction surgery on 04/13/2023 who presents with concerns for infection of the left breast.     [facility] ED & Hospital Course: Patient presented to the ED on 04/29/2023 with severe left breast pain, redness, and heat.  She also reported a fever to 101F at home.  She had blood cultures drawn which have been negative to date.  CT in the ED demonstrated loosely organized fluid collections in the bilateral breasts.   She was admitted to the hospitalist service on 04/29/2023, started on Rocephin  and vancomycin , but failed to show any clinical improvement, developed malodorous purulent drainage, and plastic surgeon (Dr. Waddell) recommended transfer to [facility] for likely operative management.    Significant Events: Admitted 05/02/2023 as a transfer from [facility] due to post-op infection of her left breast surgery   Significant Labs: Intra-op cultures grew Citrobacter and Prevotella  Significant Imaging Studies:   Antibiotic Therapy: Anti-infectives (From admission, onward)    Start     Dose/Rate Route Frequency Ordered Stop   05/03/23 1300  vancomycin  (VANCOREADY) IVPB 1750 mg/350 mL  Status:  Discontinued        1,750 mg 175 mL/hr over 120 Minutes Intravenous Every 24 hours 05/02/23 2211 05/03/23 1026    05/03/23 1300  Vancomycin  (VANCOCIN ) 1,500 mg in sodium chloride  0.9 % 500 mL IVPB        1,500 mg 250 mL/hr over 120 Minutes Intravenous Every 24 hours 05/03/23 1026 05/06/23 1259   05/03/23 1145  ceFAZolin  (ANCEF ) IVPB 2g/100 mL premix        2 g 200 mL/hr over 30 Minutes Intravenous On call to O.R. 05/03/23 1057 05/04/23 0559   05/03/23 0900  cefTRIAXone  (ROCEPHIN ) 2 g in sodium chloride  0.9 % 100 mL IVPB        2 g 200 mL/hr over 30 Minutes Intravenous Daily 05/03/23 0814         Procedures:   Consultants: Plastic Surgery    Assessment and Plan: * Post op infection of left breast 05-03-2023 continue with IV rocephin /Vanco. Pt to go to OR today for washout/I&D by plastic surgery Dr. Waddell. 05-04-2023 s/p abscess washout yesterday. Plastic surgery to manage pt's opiate medications. Remains on IV rocephin /vanco. 05-05-2023 IV ABX changed to IV meropenem  yesterday after conversation with Dr. Waddell with plastic surgery. Abx changed due to failure to improve and continue purulent drainage from left breast despite washout and surgical I&D. Intra-op gram stain showing GNR. Concern for possible pseudomonas or other MRDO given lack of improvement on IV Rocephin . Continue IV Vanco for now. Awaiting final ID on intra-op cultures. 05-06-2023 remains on IV meropenem  Day #2, Vanco #7. Completed 5 days of IV rocephin . Intra-op cultures growing CITROBACTER KOSERI. No final sensitivities. Awaiting final cultures. Increase IV morphine  to 2 mg prn. Discussed with ID. Add po flagyl  500 mg bid. DC IV Vanco 05-07-2023  Vanco Day #8, Meropenem  Day #3, Flagyl  Day #2. Completed 5 days of IV rocephin  but was not improving. Plastic surgery to decide on taking back to OR for washout #2 today. 05-08-2023 discussed with plastic surgery. No plans to take to OR again. Will change to po cipro  750 mg bid. Continue with po flagyl . IV meropenem  stopped yesterday. Completed 3 days of meropenem  and 8 days of Vanco.  Flagyl   Day #3.  If stable tomorrow, consider discharge to home with prolonged course of abx and f/u with plastic surgery. 05-09-2023 cleared for discharge by plastic surgery. Pt requesting po diflucan  for vaginal candidiasis. Dc to home with 2 weeks of cipro  and flagyl . Pt to f/u with plastic surgery this week.  Cellulitis of left breast 05-03-2023 continue with IV rocephin /Vanco. Pt to go to OR today for washout/I&D by plastic surgery Dr. Waddell. 05-04-2023 s/p abscess washout yesterday. Plastic surgery to manage pt's opiate medications. Remains on IV rocephin /vanco. 05-05-2023 IV ABX changed to IV meropenem  yesterday after conversation with Dr. Waddell with plastic surgery. Abx changed due to failure to improve and continue purulent drainage from left breast despite washout and surgical I&D. Intra-op gram stain showing GNR. Concern for possible pseudomonas or other MRDO given lack of improvement on IV Rocephin . Continue IV Vanco for now. Awaiting final ID on intra-op cultures.  05-06-2023 remains on IV meropenem  Day #2, Vanco #7. Completed 5 days of IV rocephin . Intra-op cultures growing CITROBACTER KOSERI. No final sensitivities. Awaiting final cultures. Increase IV morphine  to 2 mg prn. Discussed with ID. Add po flagyl  500 mg bid. DC IV Vanco 05-07-2023 Vanco Day #8, Meropenem  Day #3, Flagyl  Day #2. Completed 5 days of IV rocephin  but was not improving. Plastic surgery to decide on taking back to OR for washout #2 today. 05-08-2023 discussed with plastic surgery. No plans to take to OR again. Will change to po cipro  750 mg bid. Continue with po flagyl . IV meropenem  stopped yesterday. Completed 3 days of meropenem  and 8 days of Vanco.  Flagyl  Day #3.  If stable tomorrow, consider discharge to home with prolonged course of abx and f/u with plastic surgery.  05-09-2023 cleared for discharge by plastic surgery. Pt requesting po diflucan  for vaginal candidiasis. Dc to home with 2 weeks of cipro  and flagyl . Pt to  f/u with plastic surgery this week.   Depression with anxiety 05-04-2023 to 05-08-2023 stable. On cymbalta  60 mg daily, buspar  5 mg bid.  Acquired hypothyroidism 05-04-2023 to 05-08-2023 stable on synthroid  100 mcg daily.  Essential hypertension 05-04-2023 to 05-08-2023 stable on 5 mg norvasc   Obesity, Class III, BMI 40-49.9 (morbid obesity) (HCC) BMI 41.4  Allergy-induced asthma, mild intermittent, uncomplicated 05-04-2023 continue with prn albuterol  nebs.  Acute postoperative anemia due to expected blood loss DC to home with ferrous sulfate .  Hypokalemia Repleted with po kcl and resolved.  Hyponatremia 05-04-2023 clinically insignificant. Likely due to low solute intake due to abscess/illness. 05-05-2023 awaiting BMP today. 05-06-2023 resolved.  Opioid-induced constipation 05/07/2023 continue movantik  while on po/IV opiates. Continue daily miralax . 05-08-2023 resolved. Pt with multiple Bms. Pt trying not to take opiates. Will stop movantik . Change laxatives to prn.   DVT prophylaxis: SCDs Start: 05/02/23 2134    Code Status: Full Code Family Communication: no family at bedside Disposition Plan: return home Reason for continuing need for hospitalization: stable for DC.  Objective: Vitals:   05/08/23 0808 05/08/23 1504 05/08/23 2100 05/09/23 0500  BP: 126/87 124/68 108/69 122/69  Pulse: 73 71 76 78  Resp: 16 17 18 18   Temp: 98.5 F (36.9 C) 98.2 F (36.8 C) 98.6 F (37 C) 98.6 F (37 C)  TempSrc: Oral Oral Oral Oral  SpO2: 100% 96% 100% 100%  Weight:      Height:        Intake/Output Summary (Last 24 hours) at 05/09/2023 1002 Last data filed at 05/08/2023 2033 Gross per 24 hour  Intake 240 ml  Output 920 ml  Net -680 ml   Filed Weights   05/02/23 2239 05/03/23 1419  Weight: 106 kg 105.7 kg    Examination:  Physical Exam Vitals and nursing note reviewed.  Constitutional:      Appearance: She is obese.  HENT:     Head: Normocephalic and  atraumatic.     Nose: Nose normal.  Pulmonary:     Effort: Pulmonary effort is normal. No respiratory distress.  Skin:    Capillary Refill: Capillary refill takes less than 2 seconds.     Comments: Good granulation tissue infra-areola on right breast  upper outer quadrant left breast is soft.  Neurological:     Mental Status: She is alert and oriented to person, place, and time.     Data Reviewed: I have personally reviewed following labs and imaging studies  CBC: Recent Labs  Lab 05/03/23 0556 05/04/23 0318 05/05/23 1048 05/08/23 0447 05/09/23 0618  WBC 16.2* 14.8* 10.8* 9.0 9.4  NEUTROABS  --   --   --  4.8 5.2  HGB 10.0* 9.5* 9.8* 8.7* 8.8*  HCT 30.5* 29.3* 30.3* 27.1* 28.0*  MCV 83.3 84.2 83.5 84.4 85.4  PLT 510* 495* 529* 466* 454*   Basic Metabolic Panel: Recent Labs  Lab 05/03/23 0556 05/04/23 0318 05/05/23 1048 05/08/23 0447 05/09/23 0618  NA 135 132* 135 137 137  K 3.9 3.7 3.9 3.4* 4.0  CL 101 103 106 108 109  CO2 23 20* 20* 19* 19*  GLUCOSE 102* 141* 115* 96 108*  BUN 7 9 9 14 16   CREATININE 0.96 0.72 0.78 0.78 0.80  CALCIUM 9.2 8.7* 8.9 8.7* 8.8*  MG  --   --   --   --  2.0   GFR: Estimated Creatinine Clearance: 105.5 mL/min (by C-G formula based on SCr of 0.8 mg/dL). Liver Function Tests: Recent Labs  Lab 05/08/23 0447 05/09/23 0618  AST 48* 57*  ALT 42 59*  ALKPHOS 80 78  BILITOT 0.4 0.2  PROT 7.0 6.9  ALBUMIN 2.5* 2.5*   Sepsis Labs: Recent Labs  Lab 05/04/23 0318 05/09/23 0618  PROCALCITON 0.21 <0.10    Recent Results (from the past 240 hours)  Culture, blood (routine x 2)     Status: None   Collection Time: 04/29/23  6:00 PM   Specimen: BLOOD  Result Value Ref Range Status   Specimen Description BLOOD BLOOD RIGHT HAND  Final   Special Requests   Final    BOTTLES DRAWN AEROBIC AND ANAEROBIC Blood Culture results may not be optimal due to an inadequate volume of blood received in culture bottles   Culture   Final    NO  GROWTH 5 DAYS Performed at [facility], [address], [place], [place] [address]    Report Status 05/04/2023 FINAL  Final  Culture, blood (routine x 2)     Status: None   Collection Time: 04/29/23  6:30 PM   Specimen: BLOOD  Result Value Ref Range Status   Specimen Description BLOOD LEFT ANTECUBITAL  Final   Special  Requests   Final    BOTTLES DRAWN AEROBIC AND ANAEROBIC Blood Culture results may not be optimal due to an inadequate volume of blood received in culture bottles   Culture   Final    NO GROWTH 5 DAYS Performed at [facility], [address] [place], [place] [address]    Report Status 05/04/2023 FINAL  Final  Aerobic/Anaerobic Culture w Gram Stain (surgical/deep wound)     Status: None   Collection Time: 05/03/23  3:56 PM   Specimen: PATH Breast other; Tissue  Result Value Ref Range Status   Specimen Description TISSUE LEFT BREAST  Final   Special Requests PT ON VANCOMYCIN   Final   Gram Stain   Final    RARE WBC PRESENT, PREDOMINANTLY PMN RARE GRAM NEGATIVE RODS    Culture   Final    RARE CITROBACTER KOSERI FEW PREVOTELLA BIVIA BETA LACTAMASE POSITIVE Performed at [facility], [address], [place], [place] [address]    Report Status 05/08/2023 FINAL  Final   Organism ID, Bacteria CITROBACTER KOSERI  Final      Susceptibility   Citrobacter koseri - MIC*    CEFEPIME <=0.12 SENSITIVE Sensitive     CEFTAZIDIME <=1 SENSITIVE Sensitive     CEFTRIAXONE  <=0.25 SENSITIVE Sensitive     CIPROFLOXACIN  <=0.25 SENSITIVE Sensitive     GENTAMICIN <=1 SENSITIVE Sensitive     IMIPENEM <=0.25 SENSITIVE Sensitive     TRIMETH /SULFA  <=20 SENSITIVE Sensitive     PIP/TAZO <=4 SENSITIVE Sensitive ug/mL    * RARE CITROBACTER KOSERI     Radiology Studies: No results found.  Scheduled Meds:  amLODipine   5 mg Oral Daily   busPIRone   5 mg Oral BID   chlorhexidine    Topical BID   ciprofloxacin   750 mg Oral BID   DULoxetine   60 mg Oral Daily    enoxaparin  (LOVENOX ) injection  50 mg Subcutaneous Q24H   levothyroxine   100 mcg Oral QAC breakfast   metroNIDAZOLE   500 mg Oral Q12H   topiramate   100 mg Oral QHS   Continuous Infusions:   LOS: 7 days   Time spent: 45 minutes  Camellia Door, DO  [facility]  05/09/2023, 10:02 AM

## 2023-05-09 NOTE — Assessment & Plan Note (Signed)
 Repleted with po kcl and resolved.

## 2023-05-12 ENCOUNTER — Ambulatory Visit: Payer: Medicare Other | Admitting: Plastic Surgery

## 2023-05-12 ENCOUNTER — Encounter: Payer: Self-pay | Admitting: Plastic Surgery

## 2023-05-12 VITALS — BP 136/89 | HR 90 | Ht 63.0 in | Wt 222.6 lb

## 2023-05-12 DIAGNOSIS — T8149XA Infection following a procedure, other surgical site, initial encounter: Secondary | ICD-10-CM

## 2023-05-12 DIAGNOSIS — Z9889 Other specified postprocedural states: Secondary | ICD-10-CM

## 2023-05-12 NOTE — Progress Notes (Signed)
 Ms. Glassburn returns today for evaluation.  Patient underwent a relatively uncomplicated bilateral breast reduction.  She subsequently developed a left breast abscess that required surgical intervention.  Cultures from the breast washout revealed Citrobacter and Prevotella.  She was ultimately placed on Rocephin  and Flagyl  IV with rapid improvement.  She was discharged home on Cipro  and Flagyl .  She returns today for evaluation.  She is overall doing well.  Heart rate is within normal range and she is afebrile.  On examination the breast has no evidence of erythema and the edema has resolved.  She is much less tender to palpation.  There is a small amount of separation of the incision on the left breast and an area of skin breakdown on the inframammary fold on the right.  Her drain has put out less than 50 mL since she was discharged home 3 days ago.  The drain is removed today without difficulty.  The patient is instructed to keep Vaseline over the open incisions and clean gauze as needed.  She may have some drainage from time to time but this should be minimal.  She will follow-up in the office in 1 week but should return sooner for any questions or concerns.

## 2023-05-13 ENCOUNTER — Other Ambulatory Visit: Payer: Self-pay | Admitting: Psychiatry

## 2023-05-13 ENCOUNTER — Other Ambulatory Visit: Payer: Self-pay | Admitting: Physician Assistant

## 2023-05-14 ENCOUNTER — Other Ambulatory Visit: Payer: Self-pay | Admitting: Family Medicine

## 2023-05-14 ENCOUNTER — Encounter: Payer: Self-pay | Admitting: Family Medicine

## 2023-05-14 DIAGNOSIS — B370 Candidal stomatitis: Secondary | ICD-10-CM

## 2023-05-14 MED ORDER — NYSTATIN 100000 UNIT/ML MT SUSP: 5.0000 mL | Freq: Four times a day (QID) | OROMUCOSAL | 0 refills | Status: DC

## 2023-05-19 ENCOUNTER — Encounter: Payer: Self-pay | Admitting: Plastic Surgery

## 2023-05-19 ENCOUNTER — Ambulatory Visit: Payer: Medicare Other | Admitting: Physician Assistant

## 2023-05-19 ENCOUNTER — Encounter: Payer: Self-pay | Admitting: Student

## 2023-05-19 VITALS — BP 143/86 | HR 78

## 2023-05-19 DIAGNOSIS — Z9889 Other specified postprocedural states: Secondary | ICD-10-CM

## 2023-05-19 NOTE — Progress Notes (Signed)
Patient is a pleasant 43 year old female with history of bilateral breast reduction 04/13/2023 complicated by left breast abscess requiring OR washout 05/03/2023 who presents to clinic for postoperative follow-up.  She was last seen here in clinic on 05/12/2023.  At that time, drain was removed without complication or difficulty due to low volumes.  Small amount of separation of inframammary incision left breast and superficial wound inframammary fold right breast.  Today, patient is doing okay.  She is understandably concerned about her left breast given her postoperative complications.  She has been applying Vaseline to her wounds bilaterally and states that the right side seems to be doing okay.  The left side however she will occasionally feel as though it is unstable.  She is concerned about it opening further.  She is otherwise doing well from postoperative standpoint, tolerating p.o. intake and voiding without difficulty.  Pain is manageable.  On exam, breasts with good shape and symmetry.  She does have large inferior T zone wounds, left greater than right.  No drainage.  Good granular base.  No significant depth.  No malodor.  No surrounding skin erythema or induration concerning for persistent cellulitis.  Her exam today is benign and she appears to be healing appropriately.  I do believe that she would benefit removal of the Prolene sutures as she is already overgrown in certain areas and do not want her to have any retained permanent stitches.  Dr. Ladona Ridgel also personally evaluated patient and agrees with assessment and plan.  Suture removal tolerated without complication or difficulty.  Recommending follow-up in 2 weeks for reevaluation, sooner if needed.  All questions answered to patient's satisfaction.  Picture(s) obtained of the patient and placed in the chart were with the patient's or guardian's permission.

## 2023-05-19 NOTE — Progress Notes (Signed)
Patient is a pleasant 43 year old female with history of bilateral breast reduction 04/13/2023 complicated by left breast abscess requiring OR washout 05/03/2023 who presents to clinic for postoperative follow-up.  She was last seen here in clinic on 05/12/2023.  At that time, drain was removed without complication or difficulty due to low volumes.  Small amount of separation of inframammary incision left breast and superficial wound inframammary fold right breast.  Today

## 2023-05-21 ENCOUNTER — Telehealth: Payer: Self-pay | Admitting: Plastic Surgery

## 2023-05-21 ENCOUNTER — Encounter: Payer: Self-pay | Admitting: Family Medicine

## 2023-05-21 ENCOUNTER — Telehealth (INDEPENDENT_AMBULATORY_CARE_PROVIDER_SITE_OTHER): Payer: Self-pay | Admitting: Surgical

## 2023-05-21 ENCOUNTER — Telehealth: Payer: Self-pay | Admitting: Physician Assistant

## 2023-05-21 ENCOUNTER — Encounter: Payer: Self-pay | Admitting: Plastic Surgery

## 2023-05-21 ENCOUNTER — Ambulatory Visit (INDEPENDENT_AMBULATORY_CARE_PROVIDER_SITE_OTHER): Payer: Medicare Other | Admitting: Family Medicine

## 2023-05-21 VITALS — BP 124/76 | HR 84 | Temp 98.3°F | Resp 18 | Ht 63.0 in | Wt 225.3 lb

## 2023-05-21 DIAGNOSIS — F439 Reaction to severe stress, unspecified: Secondary | ICD-10-CM | POA: Diagnosis not present

## 2023-05-21 DIAGNOSIS — N61 Mastitis without abscess: Secondary | ICD-10-CM

## 2023-05-21 DIAGNOSIS — T8140XD Infection following a procedure, unspecified, subsequent encounter: Secondary | ICD-10-CM | POA: Diagnosis not present

## 2023-05-21 DIAGNOSIS — Z719 Counseling, unspecified: Secondary | ICD-10-CM

## 2023-05-21 NOTE — Telephone Encounter (Addendum)
Patient was seen by PCP today, PCP noted concerns related to drainage from her left breast wound and the left breast wound.  After receiving notification from PCP, personally called patient to discuss her appointment with PCP today and her symptoms.  She reports overall she is feeling well, she is not having any infectious symptoms.  She reports that she is frustrated, mostly due to the fact that she was not expecting to be hospitalized after a breast reduction surgery and to be still dealing with recovery.  In regards to her current symptoms, she is not having any systemic symptoms.  She does report that she has noticed a little bit of firmness and possible swelling of the middle of her breasts.  She reports that she is going to send me a photo of this via MyChart for [place] to evaluate.  She reports that she is currently still taking her antibiotics, she is on ciprofloxacin and metronidazole.  She has enough antibiotics to cover her until Monday a.m.  I discussed with the patient that I would like to review the photos, recommend she continue taking antibiotic, increase her protein with protein shakes or protein rich diet, add multivitamin to her supplements.  We discussed that I would review the photos, plan to call her back once received.  If she has worsening symptoms or concerns over the weekend, she is aware that we have an on-call service for her to call 24/7.  Patient is agreeable with this plan and seems appreciative of the call.  Will plan to talk with her later this afternoon once photo is received via MyChart.   PCP note was also reviewed, images reviewed, vital signs reviewed which were stable.   Attempted to reach [name] by phone but was unable to.  Reviewed the photograph in her chart placed there today by her primary care physician.  There is no change from when I saw her at her last appointment.  Will see her on Monday morning unless she has an acute change over the weekend.

## 2023-05-21 NOTE — Telephone Encounter (Signed)
Revonda Standard let me know that Susy Frizzle said it was ok for the pt to come in on Monday at 830am to see Gerre Pebbles

## 2023-05-21 NOTE — Telephone Encounter (Signed)
POSTOP ED F/U for left breast debridement SX: 12/30 , pt call about her wound. She spoke with Revonda Standard and [place] about her concerns

## 2023-05-21 NOTE — Telephone Encounter (Signed)
Pt called and said it would only let her send the photos of wound to Dr Ladona Ridgel in her [facility] and she wanted to update Matt.

## 2023-05-21 NOTE — Progress Notes (Signed)
Name: Catherine Berg   MRN: [id number]    DOB: 1981-03-31   Date:05/21/2023       Progress Note  Subjective  Chief Complaint  Chief Complaint  Patient presents with   Hospitalization Follow-up    But has been over 14 days, feeling a little better   Discussed the use of AI scribe software for clinical note transcription with the patient, who gave verbal consent to proceed.  History of Present Illness   The patient underwent a breast reduction surgery on December 10th. Initially, she reported no issues post-surgery. However, during subsequent follow-up appointments, she began to experience increased pain in the left breast, which was more severe than the right. Despite expressing these concerns to the physician assistants during follow-ups, the patient felt her concerns were dismissed.  By December 23rd, the patient noticed redness setting in on the left breast. Despite this, the physician assistant did not seem concerned. On December 26th, the patient came to our office and saw Della Goo, NP.  She prescribed antibiotics and pain medication. However, upon returning home, the patient developed a fever and experienced severe pain, prompting a visit to [facility].  At [facility], it was discovered that the patient had an infection. The patient was then transferred to [facility] for an incision and drainage procedure, which was performed on December 30th. Post-procedure, the patient reported an open wound under the left breast, she had a visit with surgeon two days ago and states was given reassurance, however today she woke up with more pain on left breast, swelling between both breasts, increase in drainage and odor.   I tried to reach out to surgeon on instant message, but it was under busy, therefore I advised patient to drive to their office today and if not able to be seen to go to [facility] for labs , possible repeat culture and [place]       Patient Active Problem List   Diagnosis Date Noted   Acute  postoperative anemia due to expected blood loss 05/09/2023   Hypokalemia 05/08/2023   Hyponatremia 05/04/2023   Cellulitis of left breast 05/02/2023   Post op infection of left breast 04/29/2023   Essential hypertension 04/29/2023   Acquired hypothyroidism 04/29/2023   Depression with anxiety 04/29/2023   Asthma 05/12/2022   MDD (major depressive disorder), recurrent episode, mild (HCC) 11/17/2021   Neuropathy involving both lower extremities 11/17/2021   Perennial allergic rhinitis with seasonal variation 11/17/2021   Chronic pain of both lower extremities 09/24/2020   Sleeps in sitting position due to orthopnea 07/02/2020   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   Vitamin D deficiency 04/22/2020   COVID-19 long hauler manifesting chronic fatigue 04/22/2020   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   Memory loss 02/28/2020   Physical deconditioning 02/28/2020   Dyspnea on exertion 02/28/2020   Neck pain, bilateral 02/28/2020   Muscle spasms of neck 02/28/2020   Arthralgia of hand 01/24/2019   Class 3 severe obesity due to excess calories without serious comorbidity in adult [facility]) 01/24/2019   Palpitations 09/16/2018   Coronary artery calcification 09/16/2018   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   Bilateral leg edema 04/20/2017   Incidental lung nodule, > 3mm and < 8mm 03/31/2017   Opioid-induced constipation 11/07/2015   Hives 09/02/2015   Obesity, Class III, BMI 40-49.9 (morbid obesity) (HCC) 08/15/2015   Anxiety 07/09/2015   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 06/28/2015   Thyroid nodule 02/01/2015   Vitamin B12 deficiency    History of abnormal cervical Pap smear  Allergy-induced asthma, mild intermittent, uncomplicated 10/09/2013    Past Surgical History:  Procedure Laterality Date   BREAST REDUCTION SURGERY Bilateral 04/13/2023   Procedure: MAMMARY REDUCTION  (BREAST);  Surgeon: [name], MD;  Location: [facility];  Service: Plastics;  Laterality:  Bilateral;   BREAST SURGERY     CERVICAL POLYPECTOMY     CESAREAN SECTION     x 2   CHOLECYSTECTOMY     COLONOSCOPY WITH PROPOFOL N/A 11/14/2018   Procedure: COLONOSCOPY WITH PROPOFOL;  Surgeon: Pasty Spillers, MD;  Location: [facility];  Service: Endoscopy;  Laterality: N/A;   ESOPHAGOGASTRODUODENOSCOPY (EGD) WITH PROPOFOL N/A 11/14/2018   Procedure: ESOPHAGOGASTRODUODENOSCOPY (EGD) WITH PROPOFOL;  Surgeon: Pasty Spillers, MD;  Location: [facility];  Service: Endoscopy;  Laterality: N/A;  Latex allergy   GIVENS CAPSULE STUDY N/A 12/27/2018   Procedure: GIVENS CAPSULE STUDY;  Surgeon: Pasty Spillers, MD;  Location: [facility];  Service: Endoscopy;  Laterality: N/A;   INCISION AND DRAINAGE OF WOUND Left 05/03/2023   Procedure: IRRIGATION AND DEBRIDEMENT WOUND;  Surgeon: [name], MD;  Location: [facility];  Service: Plastics;  Laterality: Left;    Family History  Problem Relation Age of Onset   Heart attack Mother 29   Hypertension Mother    Asthma Mother    Cancer Mother        laryngeal   Diabetes Mother        pre diabetic   Heart disease Mother    Mental illness Mother    Alcohol abuse Mother    Drug abuse Mother    Depression Mother    Anxiety disorder Mother    Bipolar disorder Mother    Learning disabilities Brother    ADD / ADHD Brother    Asthma Son    Hyperlipidemia Brother    Alcohol abuse Father    Heart disease Maternal Grandmother        heart attack, pacemaker   Heart attack Maternal Grandmother    Hypertension Maternal Grandmother    Hypertension Maternal Grandfather    Heart disease Paternal Grandmother        couple of major open heart surgeries, leaking valves   Hypertension Paternal Grandmother    Hypertension Paternal Grandfather    Breast cancer Neg Hx     Social History   Tobacco Use   Smoking status: Never   Smokeless tobacco: Never  Substance Use Topics   Alcohol use: No     Current  Outpatient Medications:    amLODipine (NORVASC) 5 MG tablet, Take 1 tablet (5 mg total) by mouth daily., Disp: 90 tablet, Rfl: 1   bisacodyl (DULCOLAX) 5 MG EC tablet, Take 1 tablet (5 mg total) by mouth daily as needed for moderate constipation., Disp: 30 tablet, Rfl: 1   busPIRone (BUSPAR) 5 MG tablet, Take 1 tablet (5 mg total) by mouth 2 (two) times daily., Disp: 180 tablet, Rfl: 0   ciprofloxacin (CIPRO) 750 MG tablet, Take 1 tablet (750 mg total) by mouth 2 (two) times daily for 14 days., Disp: 28 tablet, Rfl: 0   DULoxetine (CYMBALTA) 60 MG capsule, Take 1 capsule (60 mg total) by mouth daily., Disp: 90 capsule, Rfl: 0   fluconazole (DIFLUCAN) 150 MG tablet, Take 1 tablet (150 mg total) by mouth every 3 (three) days for 5 doses., Disp: 5 tablet, Rfl: 0   hydrOXYzine (ATARAX) 10 MG tablet, Take 1 tablet (10 mg total) by mouth daily as  needed for anxiety., Disp: 30 tablet, Rfl: 3   levothyroxine (SYNTHROID) 100 MCG tablet, Take 100 mcg by mouth daily before breakfast., Disp: , Rfl:    linaclotide (LINZESS) 72 MCG capsule, Take 1 capsule (72 mcg total) by mouth daily before breakfast., Disp: 30 capsule, Rfl: 0   metFORMIN (GLUCOPHAGE) 500 MG tablet, Take 1 tablet (500 mg total) by mouth daily with breakfast. (Patient taking differently: Take 500 mg by mouth daily with breakfast. Takes for long covid neuropathy), Disp: 90 tablet, Rfl: 3   metroNIDAZOLE (FLAGYL) 500 MG tablet, Take 1 tablet (500 mg total) by mouth every 12 (twelve) hours for 14 days., Disp: 28 tablet, Rfl: 0   mometasone-formoterol (DULERA) 200-5 MCG/ACT AERO, Inhale 2 puffs into the lungs in the morning and at bedtime., Disp: 1 each, Rfl: 11   montelukast (SINGULAIR) 10 MG tablet, Take 1 tablet (10 mg total) by mouth at bedtime., Disp: 90 tablet, Rfl: 1   nystatin (MYCOSTATIN) 100000 UNIT/ML suspension, Use as directed 5 mLs (500,000 Units total) in the mouth or throat 4 (four) times daily. Swish and spit, Disp: 60 mL, Rfl: 0    omeprazole (PRILOSEC) 20 MG capsule, Take 1 capsule (20 mg total) by mouth 2 (two) times daily before a meal., Disp: 90 capsule, Rfl: 3   ondansetron (ZOFRAN) 4 MG tablet, Take 1 tablet (4 mg total) by mouth every 6 (six) hours as needed for nausea., Disp: 30 tablet, Rfl: 0   topiramate (TOPAMAX) 100 MG tablet, Take 1 tablet (100 mg total) by mouth at bedtime., Disp: 90 tablet, Rfl: 3   traZODone (DESYREL) 50 MG tablet, Take 1 tablet (50 mg total) by mouth at bedtime as needed for sleep., Disp: 30 tablet, Rfl: 3   VENTOLIN HFA 108 (90 Base) MCG/ACT inhaler, INHALE 2 PUFFS BY MOUTH EVERY 6 HOURS AS NEEDED FOR WHEEZING OR SHORTNESS OF BREATH, Disp: 18 g, Rfl: 1   Vitamin D, Ergocalciferol, (DRISDOL) 1.25 MG (50000 UNIT) CAPS capsule, Take 1 capsule (50,000 Units total) by mouth every 7 (seven) days., Disp: 12 capsule, Rfl: 1   ferrous sulfate 325 (65 FE) MG EC tablet, Take 1 tablet (325 mg total) by mouth 2 (two) times daily for 10 days., Disp: 20 tablet, Rfl: 0  Allergies  Allergen Reactions   Latex Hives    (Balloons, condoms, underwear elastic, gloves)   Shellfish Allergy Hives    I personally reviewed active problem list, medication list, allergies with the patient/caregiver today.   ROS  Ten systems reviewed and is negative except as mentioned in HPI    Objective  Vitals:   05/21/23 1307  BP: 124/76  Pulse: 84  Resp: 18  Temp: 98.3 F (36.8 C)  TempSrc: Oral  SpO2: 97%  Weight: 225 lb 4.8 oz (102.2 kg)  Height: 5\' 3"  (1.6 m)    Body mass index is 39.91 kg/m.  Physical Exam  Constitutional: Patient appears well-developed and well-nourished. Obese  No distress.  HEENT: head atraumatic, normocephalic, pupils equal and reactive to light, neck supple Cardiovascular: Normal rate, regular rhythm and normal heart sounds.  No murmur heard. No BLE edema. Breast exam: left breast has open wound , drainage is yellow but has an odor, induration below left nipple and inner lower  left breast, puffiness without redness between both breasts Pulmonary/Chest: Effort normal and breath sounds normal. No respiratory distress. Abdominal: Soft.  There is no tenderness. Psychiatric: Patient has a normal mood and affect. behavior is normal. Judgment and thought content  normal.   Recent Results (from the past 2160 hours)  H. pylori breath test     Status: Abnormal   Collection Time: 02/25/23  3:25 PM  Result Value Ref Range   H. pylori Breath Test DETECTED (A) NOT DETECTED    Comment: . Antimicrobials, proton pump inhibitors, and bismuth preparations are known to suppress H. pylori, and  ingestion of these prior to H. pylori diagnostic testing may lead to false negative results. If clinically  indicated, the test may be repeated on a new specimen obtained two weeks after discontinuing treatment. However, a positive result is still clinically valid.   CBC with Differential/Platelet     Status: None   Collection Time: 02/25/23  3:25 PM  Result Value Ref Range   WBC 6.6 3.8 - 10.8 Thousand/uL   RBC 4.41 3.80 - 5.10 Million/uL   Hemoglobin 12.4 11.7 - 15.5 g/dL   HCT 86.5 78.4 - 69.6 %   MCV 86.8 80.0 - 100.0 fL   MCH 28.1 27.0 - 33.0 pg   MCHC 32.4 32.0 - 36.0 g/dL    Comment: For adults, a slight decrease in the calculated MCHC value (in the range of 30 to 32 g/dL) is most likely not clinically significant; however, it should be interpreted with caution in correlation with other red cell parameters and the patient's clinical condition.    RDW 13.1 11.0 - 15.0 %   Platelets 356 140 - 400 Thousand/uL   MPV 10.1 7.5 - 12.5 fL   Neutro Abs 3,109 1,500 - 7,800 cells/uL   Absolute Lymphocytes 2,957 850 - 3,900 cells/uL   Absolute Monocytes 409 200 - 950 cells/uL   Eosinophils Absolute 92 15 - 500 cells/uL   Basophils Absolute 33 0 - 200 cells/uL   Neutrophils Relative % 47.1 %   Total Lymphocyte 44.8 %   Monocytes Relative 6.2 %   Eosinophils Relative 1.4 %    Basophils Relative 0.5 %  COMPLETE METABOLIC PANEL WITH GFR     Status: None   Collection Time: 02/25/23  3:25 PM  Result Value Ref Range   Glucose, Bld 95 65 - 99 mg/dL    Comment: .            Fasting reference interval .    BUN 9 7 - 25 mg/dL   Creat 2.95 2.84 - 1.32 mg/dL   eGFR 95 > OR = 60 GM/WNU/2.72Z3   BUN/Creatinine Ratio SEE NOTE: 6 - 22 (calc)    Comment:    Not Reported: BUN and Creatinine are within    reference range. .    Sodium 136 135 - 146 mmol/L   Potassium 4.3 3.5 - 5.3 mmol/L   Chloride 102 98 - 110 mmol/L   CO2 26 20 - 32 mmol/L   Calcium 9.7 8.6 - 10.2 mg/dL   Total Protein 7.6 6.1 - 8.1 g/dL   Albumin 4.1 3.6 - 5.1 g/dL   Globulin 3.5 1.9 - 3.7 g/dL (calc)   AG Ratio 1.2 1.0 - 2.5 (calc)   Total Bilirubin 0.5 0.2 - 1.2 mg/dL   Alkaline phosphatase (APISO) 69 31 - 125 U/L   AST 18 10 - 30 U/L   ALT 19 6 - 29 U/L  Pregnancy, urine POC     Status: None   Collection Time: 04/13/23 10:54 AM  Result Value Ref Range   Preg Test, Ur NEGATIVE NEGATIVE    Comment:        THE SENSITIVITY OF THIS  METHODOLOGY IS >24 mIU/mL   Surgical pathology     Status: None   Collection Time: 04/13/23  1:01 PM  Result Value Ref Range   SURGICAL PATHOLOGY      SURGICAL PATHOLOGY CASE: [id number] PATIENT: Maren Rhodes Surgical Pathology Report     Clinical History: Macromastia (crm)     FINAL MICROSCOPIC DIAGNOSIS:  A. BREAST, RIGHT, REDUCTION MAMMOPLASTY: - Benign breast parenchyma with no specific histopathologic changes - Negative for carcinoma  B. BREAST, LEFT, REDUCTION MAMMOPLASTY: - Benign breast parenchyma with no specific histopathologic changes - Negative for carcinoma      GROSS DESCRIPTION:  A: Specimen: Right breast tissue, received fresh Weight: 1674 g Size in aggregate: 29 x 19 x 5.5 cm including attached and separate portions of skin. Cut Surface: The cut surfaces consist of soft yellow adipose tissue and white fibrous  tissue.  There are no discrete masses. Block Summary: 2 blocks submitted  B: Specimen: Left breast tissue, received fresh Weight: 1568 g Size in aggregate: 28 x 27 x 5.5 cm including attached and separate portions of skin. Cut Surface: The cut surfaces consist of sof t yellow adipose tissue and white fibrous tissue.  There are no discrete masses. Block Summary: 2 blocks submitted (GRP 06/14/2022)    Final Diagnosis performed by Holley Bouche, MD.   Electronically signed 04/15/2023 Technical component performed at [facility]. [facility], [address] [place], [place] [address].  Professional component performed at [facility], [address] [place], [place] [address].  Immunohistochemistry Technical component (if applicable) was performed at [facility]. [address], [place], [place] [address].   IMMUNOHISTOCHEMISTRY DISCLAIMER (if applicable): Some of these immunohistochemical stains may have been developed and the performance characteristics determine by [facility]. Some may not have been cleared or approved by the U.S. Food and Drug Administration. The FDA has determined that such clearance or approval is not necessary. This test is used for clinical  purposes. It should not be regarded as investigational or for research. This laboratory is certified under the Clinical Laboratory Improvement Amendments of 1988 (CLIA-88) as qualified to perform high complexity clinical laboratory testing.  The controls stained appropriately.   IHC stains are performed on formalin fixed, paraffin embedded tissue using a 3,3"diaminobenzidine (DAB) chromogen and Leica Bond Autostainer System. The staining intensity of the nucleus is score manually and is reported as the percentage of tumor cell nuclei demonstrating specific nuclear staining. The specimens are fixed in 10% Neutral Formalin for at least 6 hours and up to  72hrs. These tests are validated on decalcified tissue. Results should be interpreted with caution given the possibility of false negative results on decalcified specimens. Antibody Clones are as follows ER-clone 53F, PR-clone 16, Ki67- clone MM1. Some of these immunohistochemical stains may have been developed and the perfo rmance characteristics determined by [facility].   CBC     Status: Abnormal   Collection Time: 04/29/23  3:16 PM  Result Value Ref Range   WBC 15.2 (H) 4.0 - 10.5 K/uL   RBC 3.54 (L) 3.87 - 5.11 MIL/uL   Hemoglobin 9.7 (L) 12.0 - 15.0 g/dL   HCT 37.6 (L) 28.3 - 15.1 %   MCV 83.1 80.0 - 100.0 fL   MCH 27.4 26.0 - 34.0 pg   MCHC 33.0 30.0 - 36.0 g/dL   RDW 76.1 60.7 - 37.1 %   Platelets 474 (H) 150 - 400 K/uL   nRBC 0.0 0.0 -  0.2 %    Comment: Performed at [facility], [address] [place], [place] [address]  Basic metabolic panel     Status: Abnormal   Collection Time: 04/29/23  3:16 PM  Result Value Ref Range   Sodium 133 (L) 135 - 145 mmol/L   Potassium 3.7 3.5 - 5.1 mmol/L   Chloride 103 98 - 111 mmol/L   CO2 19 (L) 22 - 32 mmol/L   Glucose, Bld 141 (H) 70 - 99 mg/dL    Comment: Glucose reference range applies only to samples taken after fasting for at least 8 hours.   BUN 11 6 - 20 mg/dL   Creatinine, Ser 5.18 0.44 - 1.00 mg/dL   Calcium 8.7 (L) 8.9 - 10.3 mg/dL   GFR, Estimated >84 >16 mL/min    Comment: (NOTE) Calculated using the CKD-EPI Creatinine Equation (2021)    Anion gap 11 5 - 15    Comment: Performed at [facility], [address] [place], [place] [address]  Lactic acid, plasma     Status: None   Collection Time: 04/29/23  6:00 PM  Result Value Ref Range   Lactic Acid, Venous 0.7 0.5 - 1.9 mmol/L    Comment: Performed at [facility], [address] [place], [place] [address]  Culture, blood (routine x 2)     Status: None   Collection Time: 04/29/23  6:00 PM   Specimen: BLOOD  Result Value  Ref Range   Specimen Description BLOOD BLOOD RIGHT HAND    Special Requests      BOTTLES DRAWN AEROBIC AND ANAEROBIC Blood Culture results may not be optimal due to an inadequate volume of blood received in culture bottles   Culture      NO GROWTH 5 DAYS Performed at [facility], [address], [place], [place] [address]    Report Status 05/04/2023 FINAL   Culture, blood (routine x 2)     Status: None   Collection Time: 04/29/23  6:30 PM   Specimen: BLOOD  Result Value Ref Range   Specimen Description BLOOD LEFT ANTECUBITAL    Special Requests      BOTTLES DRAWN AEROBIC AND ANAEROBIC Blood Culture results may not be optimal due to an inadequate volume of blood received in culture bottles   Culture      NO GROWTH 5 DAYS Performed at [facility], [address] [place], [place] [address]    Report Status 05/04/2023 FINAL   Basic metabolic panel     Status: Abnormal   Collection Time: 04/30/23  5:25 AM  Result Value Ref Range   Sodium 134 (L) 135 - 145 mmol/L   Potassium 3.9 3.5 - 5.1 mmol/L   Chloride 105 98 - 111 mmol/L   CO2 19 (L) 22 - 32 mmol/L   Glucose, Bld 116 (H) 70 - 99 mg/dL    Comment: Glucose reference range applies only to samples taken after fasting for at least 8 hours.   BUN 9 6 - 20 mg/dL   Creatinine, Ser 0.25 0.44 - 1.00 mg/dL   Calcium 8.5 (L) 8.9 - 10.3 mg/dL   GFR, Estimated >42 >70 mL/min    Comment: (NOTE) Calculated using the CKD-EPI Creatinine Equation (2021)    Anion gap 10 5 - 15    Comment: Performed at [facility], [address], [place], [place] [address]  CBC     Status: Abnormal   Collection Time: 04/30/23  5:25 AM  Result Value Ref Range   WBC 14.6 (H) 4.0 - 10.5 K/uL   RBC 3.18 (L) 3.87 - 5.11 MIL/uL   Hemoglobin 8.7 (L) 12.0 - 15.0 g/dL   HCT 19.1 (L) 47.8 - 29.5 %   MCV 84.9 80.0 - 100.0 fL   MCH 27.4 26.0 - 34.0 pg   MCHC 32.2 30.0 - 36.0 g/dL   RDW 62.1 30.8 - 65.7 %   Platelets 435 (H) 150 -  400 K/uL   nRBC 0.0 0.0 - 0.2 %    Comment: Performed at [facility], [address] [place], [place] [address]  C-reactive protein     Status: Abnormal   Collection Time: 04/30/23  5:25 AM  Result Value Ref Range   CRP 17.0 (H) <1.0 mg/dL    Comment: Performed at [facility], [address] [place], [place] [address]  HIV Antibody (routine testing w rflx)     Status: None   Collection Time: 04/30/23  5:25 AM  Result Value Ref Range   HIV Screen 4th Generation wRfx Non Reactive Non Reactive    Comment: Performed at [facility], [address] [place], [place] [address]  Sedimentation rate     Status: Abnormal   Collection Time: 04/30/23  5:25 AM  Result Value Ref Range   Sed Rate 99 (H) 0 - 20 mm/hr    Comment: Performed at [facility], [address] [place], [place] [address]  Basic metabolic panel     Status: Abnormal   Collection Time: 05/01/23  5:16 AM  Result Value Ref Range   Sodium 133 (L) 135 - 145 mmol/L   Potassium 4.0 3.5 - 5.1 mmol/L   Chloride 103 98 - 111 mmol/L   CO2 23 22 - 32 mmol/L   Glucose, Bld 113 (H) 70 - 99 mg/dL    Comment: Glucose reference range applies only to samples taken after fasting for at least 8 hours.   BUN 10 6 - 20 mg/dL   Creatinine, Ser 7.25 0.44 - 1.00 mg/dL   Calcium 8.9 8.9 - 36.6 mg/dL   GFR, Estimated >44 >03 mL/min    Comment: (NOTE) Calculated using the CKD-EPI Creatinine Equation (2021)    Anion gap 7 5 - 15    Comment: Performed at [facility], [address] [place], [place] [address]  CBC     Status: Abnormal   Collection Time: 05/01/23  5:16 AM  Result Value Ref Range   WBC 13.6 (H) 4.0 - 10.5 K/uL   RBC 3.41 (L) 3.87 - 5.11 MIL/uL   Hemoglobin 9.5 (L) 12.0 - 15.0 g/dL   HCT 95.6 (L) 38.7 - 56.4 %   MCV 83.9 80.0 - 100.0 fL   MCH 27.9 26.0 - 34.0 pg   MCHC 33.2 30.0 - 36.0 g/dL   RDW 33.2 95.1 - 88.4 %   Platelets 455 (H) 150 - 400 K/uL   nRBC 0.0 0.0 - 0.2 %     Comment: Performed at [facility], [address], [place], [place] [address]  Magnesium     Status: None   Collection Time: 05/01/23  5:16 AM  Result Value Ref Range   Magnesium 2.0 1.7 - 2.4 mg/dL    Comment: Performed at [facility], [address] [place], [place] [address]  Procalcitonin     Status: None   Collection Time: 05/01/23  5:16 AM  Result Value Ref Range   Procalcitonin <  0.10 ng/mL    Comment:        Interpretation: PCT (Procalcitonin) <= 0.5 ng/mL: Systemic infection (sepsis) is not likely. Local bacterial infection is possible. (NOTE)       Sepsis PCT Algorithm           Lower Respiratory Tract                                      Infection PCT Algorithm    ----------------------------     ----------------------------         PCT < 0.25 ng/mL                PCT < 0.10 ng/mL          Strongly encourage             Strongly discourage   discontinuation of antibiotics    initiation of antibiotics    ----------------------------     -----------------------------       PCT 0.25 - 0.50 ng/mL            PCT 0.10 - 0.25 ng/mL               OR       >80% decrease in PCT            Discourage initiation of                                            antibiotics      Encourage discontinuation           of antibiotics    ----------------------------     -----------------------------         PCT >= 0.50 ng/mL              PCT 0.26 - 0.50 ng/mL               AND        <80% decrease in PCT             Encourage initiation of                                             antibiotics       Encourage continuation           of antibiotics    ----------------------------     -----------------------------        PCT >= 0.50 ng/mL                  PCT > 0.50 ng/mL               AND         increase in PCT                  Strongly encourage                                      initiation of antibiotics    Strongly encourage escalation           of  antibiotics                                     -----------------------------  PCT <= 0.25 ng/mL                                                 OR                                        > 80% decrease in PCT                                      Discontinue / Do not initiate                                             antibiotics  Performed at [facility], [address] [place], [place] [address]   Basic metabolic panel     Status: Abnormal   Collection Time: 05/02/23  6:17 AM  Result Value Ref Range   Sodium 131 (L) 135 - 145 mmol/L   Potassium 3.8 3.5 - 5.1 mmol/L   Chloride 101 98 - 111 mmol/L   CO2 21 (L) 22 - 32 mmol/L   Glucose, Bld 113 (H) 70 - 99 mg/dL    Comment: Glucose reference range applies only to samples taken after fasting for at least 8 hours.   BUN 9 6 - 20 mg/dL   Creatinine, Ser 6.04 0.44 - 1.00 mg/dL   Calcium 8.7 (L) 8.9 - 10.3 mg/dL   GFR, Estimated >54 >09 mL/min    Comment: (NOTE) Calculated using the CKD-EPI Creatinine Equation (2021)    Anion gap 9 5 - 15    Comment: Performed at [facility], [address] [place], [place] [address]  CBC     Status: Abnormal   Collection Time: 05/02/23  6:17 AM  Result Value Ref Range   WBC 13.7 (H) 4.0 - 10.5 K/uL   RBC 3.43 (L) 3.87 - 5.11 MIL/uL   Hemoglobin 9.3 (L) 12.0 - 15.0 g/dL   HCT 47.8 (L) 29.5 - 62.1 %   MCV 83.4 80.0 - 100.0 fL   MCH 27.1 26.0 - 34.0 pg   MCHC 32.5 30.0 - 36.0 g/dL   RDW 30.8 65.7 - 84.6 %   Platelets 478 (H) 150 - 400 K/uL   nRBC 0.0 0.0 - 0.2 %    Comment: Performed at [facility], [address], [place], [place] [address]  Magnesium     Status: None   Collection Time: 05/02/23  6:17 AM  Result Value Ref Range   Magnesium 2.0 1.7 - 2.4 mg/dL    Comment: Performed at [facility], [address], [place], [place] [address]  Basic metabolic panel     Status: Abnormal   Collection Time:  05/03/23  5:56 AM  Result Value Ref Range   Sodium 135 135 - 145 mmol/L   Potassium 3.9 3.5 - 5.1 mmol/L   Chloride 101 98 - 111 mmol/L   CO2 23 22 - 32 mmol/L   Glucose, Bld 102 (H) 70 - 99 mg/dL    Comment: Glucose reference range applies only to samples taken after  fasting for at least 8 hours.   BUN 7 6 - 20 mg/dL   Creatinine, Ser 7.82 0.44 - 1.00 mg/dL   Calcium 9.2 8.9 - 95.6 mg/dL   GFR, Estimated >21 >30 mL/min    Comment: (NOTE) Calculated using the CKD-EPI Creatinine Equation (2021)    Anion gap 11 5 - 15    Comment: Performed at [facility], [address], [place], [place] [address]  CBC     Status: Abnormal   Collection Time: 05/03/23  5:56 AM  Result Value Ref Range   WBC 16.2 (H) 4.0 - 10.5 K/uL   RBC 3.66 (L) 3.87 - 5.11 MIL/uL   Hemoglobin 10.0 (L) 12.0 - 15.0 g/dL   HCT 46.9 (L) 62.9 - 52.8 %   MCV 83.3 80.0 - 100.0 fL   MCH 27.3 26.0 - 34.0 pg   MCHC 32.8 30.0 - 36.0 g/dL   RDW 41.3 24.4 - 01.0 %   Platelets 510 (H) 150 - 400 K/uL   nRBC 0.2 0.0 - 0.2 %    Comment: Performed at [facility], [address], [place], [place] [address]  Aerobic/Anaerobic Culture w Gram Stain (surgical/deep wound)     Status: None   Collection Time: 05/03/23  3:56 PM   Specimen: PATH Breast other; Tissue  Result Value Ref Range   Specimen Description TISSUE LEFT BREAST    Special Requests PT ON VANCOMYCIN    Gram Stain      RARE WBC PRESENT, PREDOMINANTLY PMN RARE GRAM NEGATIVE RODS    Culture      RARE CITROBACTER KOSERI FEW PREVOTELLA BIVIA BETA LACTAMASE POSITIVE Performed at [facility], [address], [place], [place] [address]    Report Status 05/08/2023 FINAL    Organism ID, Bacteria CITROBACTER KOSERI       Susceptibility   Citrobacter koseri - MIC*    CEFEPIME <=0.12 SENSITIVE Sensitive     CEFTAZIDIME <=1 SENSITIVE Sensitive     CEFTRIAXONE <=0.25 SENSITIVE Sensitive     CIPROFLOXACIN <=0.25 SENSITIVE Sensitive     GENTAMICIN <=1  SENSITIVE Sensitive     IMIPENEM <=0.25 SENSITIVE Sensitive     TRIMETH/SULFA <=20 SENSITIVE Sensitive     PIP/TAZO <=4 SENSITIVE Sensitive ug/mL    * RARE CITROBACTER KOSERI  Basic metabolic panel     Status: Abnormal   Collection Time: 05/04/23  3:18 AM  Result Value Ref Range   Sodium 132 (L) 135 - 145 mmol/L   Potassium 3.7 3.5 - 5.1 mmol/L   Chloride 103 98 - 111 mmol/L   CO2 20 (L) 22 - 32 mmol/L   Glucose, Bld 141 (H) 70 - 99 mg/dL    Comment: Glucose reference range applies only to samples taken after fasting for at least 8 hours.   BUN 9 6 - 20 mg/dL   Creatinine, Ser 3.47 0.44 - 1.00 mg/dL   Calcium 8.7 (L) 8.9 - 10.3 mg/dL   GFR, Estimated >42 >59 mL/min    Comment: (NOTE) Calculated using the CKD-EPI Creatinine Equation (2021)    Anion gap 9 5 - 15    Comment: Performed at [facility], [address] [place], [place] [address]  CBC     Status: Abnormal   Collection Time: 05/04/23  3:18 AM  Result Value Ref Range   WBC 14.8 (H) 4.0 - 10.5 K/uL   RBC 3.48 (L) 3.87 - 5.11 MIL/uL   Hemoglobin 9.5 (L) 12.0 -  15.0 g/dL   HCT 75.6 (L) 43.3 - 29.5 %   MCV 84.2 80.0 - 100.0 fL   MCH 27.3 26.0 - 34.0 pg   MCHC 32.4 30.0 - 36.0 g/dL   RDW 18.8 41.6 - 60.6 %   Platelets 495 (H) 150 - 400 K/uL   nRBC 0.2 0.0 - 0.2 %    Comment: Performed at [facility], [address], [place], [place] [address]  Procalcitonin     Status: None   Collection Time: 05/04/23  3:18 AM  Result Value Ref Range   Procalcitonin 0.21 ng/mL    Comment:        Interpretation: PCT (Procalcitonin) <= 0.5 ng/mL: Systemic infection (sepsis) is not likely. Local bacterial infection is possible. (NOTE)       Sepsis PCT Algorithm           Lower Respiratory Tract                                      Infection PCT Algorithm    ----------------------------     ----------------------------         PCT < 0.25 ng/mL                PCT < 0.10 ng/mL          Strongly encourage              Strongly discourage   discontinuation of antibiotics    initiation of antibiotics    ----------------------------     -----------------------------       PCT 0.25 - 0.50 ng/mL            PCT 0.10 - 0.25 ng/mL               OR       >80% decrease in PCT            Discourage initiation of                                            antibiotics      Encourage discontinuation           of antibiotics    ----------------------------     -----------------------------         PCT >= 0.50 ng/mL              PCT 0.26 - 0.50 ng/mL               AND        <80% decrease in PCT             Encourage initiation of                                             antibiotics       Encourage continuation           of antibiotics    ----------------------------     -----------------------------        PCT >= 0.50 ng/mL                  PCT > 0.50 ng/mL  AND         increase in PCT                  Strongly encourage                                      initiation of antibiotics    Strongly encourage escalation           of antibiotics                                     -----------------------------                                           PCT <= 0.25 ng/mL                                                 OR                                        > 80% decrease in PCT                                      Discontinue / Do not initiate                                             antibiotics  Performed at [facility], [address] [place], [place] [address]   Basic metabolic panel     Status: Abnormal   Collection Time: 05/05/23 10:48 AM  Result Value Ref Range   Sodium 135 135 - 145 mmol/L   Potassium 3.9 3.5 - 5.1 mmol/L   Chloride 106 98 - 111 mmol/L   CO2 20 (L) 22 - 32 mmol/L   Glucose, Bld 115 (H) 70 - 99 mg/dL    Comment: Glucose reference range applies only to samples taken after fasting for at least 8 hours.   BUN 9 6 - 20 mg/dL   Creatinine, Ser 3.24 0.44 - 1.00  mg/dL   Calcium 8.9 8.9 - 40.1 mg/dL   GFR, Estimated >02 >72 mL/min    Comment: (NOTE) Calculated using the CKD-EPI Creatinine Equation (2021)    Anion gap 9 5 - 15    Comment: Performed at [facility], [address], [place], [place] [address]  CBC     Status: Abnormal   Collection Time: 05/05/23 10:48 AM  Result Value Ref Range   WBC 10.8 (H) 4.0 - 10.5 K/uL   RBC 3.63 (L) 3.87 - 5.11 MIL/uL   Hemoglobin 9.8 (L) 12.0 - 15.0 g/dL   HCT 40.3 (L) 47.4 - 25.9 %   MCV 83.5 80.0 - 100.0 fL   MCH 27.0 26.0 - 34.0 pg   MCHC 32.3 30.0 -  36.0 g/dL   RDW 16.1 09.6 - 04.5 %   Platelets 529 (H) 150 - 400 K/uL   nRBC 0.5 (H) 0.0 - 0.2 %    Comment: Performed at [facility], [address] [place], [place] [address]  Vancomycin, trough     Status: Abnormal   Collection Time: 05/05/23 10:48 AM  Result Value Ref Range   Vancomycin Tr 6 (L) 15 - 20 ug/mL    Comment: Performed at [facility], [address], [place], [place] [address]  Comprehensive metabolic panel     Status: Abnormal   Collection Time: 05/08/23  4:47 AM  Result Value Ref Range   Sodium 137 135 - 145 mmol/L   Potassium 3.4 (L) 3.5 - 5.1 mmol/L   Chloride 108 98 - 111 mmol/L   CO2 19 (L) 22 - 32 mmol/L   Glucose, Bld 96 70 - 99 mg/dL    Comment: Glucose reference range applies only to samples taken after fasting for at least 8 hours.   BUN 14 6 - 20 mg/dL   Creatinine, Ser 8.29 0.44 - 1.00 mg/dL   Calcium 8.7 (L) 8.9 - 10.3 mg/dL   Total Protein 7.0 6.5 - 8.1 g/dL   Albumin 2.5 (L) 3.5 - 5.0 g/dL   AST 48 (H) 15 - 41 U/L   ALT 42 0 - 44 U/L   Alkaline Phosphatase 80 38 - 126 U/L   Total Bilirubin 0.4 0.0 - 1.2 mg/dL   GFR, Estimated >56 >21 mL/min    Comment: (NOTE) Calculated using the CKD-EPI Creatinine Equation (2021)    Anion gap 10 5 - 15    Comment: Performed at [facility], [address] [place], [place] [address]  CBC with Differential/Platelet     Status: Abnormal    Collection Time: 05/08/23  4:47 AM  Result Value Ref Range   WBC 9.0 4.0 - 10.5 K/uL   RBC 3.21 (L) 3.87 - 5.11 MIL/uL   Hemoglobin 8.7 (L) 12.0 - 15.0 g/dL   HCT 78.4 (L) 69.6 - 29.5 %   MCV 84.4 80.0 - 100.0 fL   MCH 27.1 26.0 - 34.0 pg   MCHC 32.1 30.0 - 36.0 g/dL   RDW 28.4 13.2 - 44.0 %   Platelets 466 (H) 150 - 400 K/uL   nRBC 0.3 (H) 0.0 - 0.2 %   Neutrophils Relative % 54 %   Neutro Abs 4.8 1.7 - 7.7 K/uL   Lymphocytes Relative 28 %   Lymphs Abs 2.6 0.7 - 4.0 K/uL   Monocytes Relative 7 %   Monocytes Absolute 0.6 0.1 - 1.0 K/uL   Eosinophils Relative 7 %   Eosinophils Absolute 0.6 (H) 0.0 - 0.5 K/uL   Basophils Relative 0 %   Basophils Absolute 0.0 0.0 - 0.1 K/uL   Immature Granulocytes 4 %   Abs Immature Granulocytes 0.38 (H) 0.00 - 0.07 K/uL    Comment: Performed at [facility], [address], [place], [place] [address]  Comprehensive metabolic panel     Status: Abnormal   Collection Time: 05/09/23  6:18 AM  Result Value Ref Range   Sodium 137 135 - 145 mmol/L   Potassium 4.0 3.5 - 5.1 mmol/L   Chloride 109 98 - 111 mmol/L   CO2 19 (L) 22 - 32 mmol/L   Glucose, Bld 108 (H) 70 - 99 mg/dL    Comment: Glucose reference range applies only to samples taken  after fasting for at least 8 hours.   BUN 16 6 - 20 mg/dL   Creatinine, Ser 1.61 0.44 - 1.00 mg/dL   Calcium 8.8 (L) 8.9 - 10.3 mg/dL   Total Protein 6.9 6.5 - 8.1 g/dL   Albumin 2.5 (L) 3.5 - 5.0 g/dL   AST 57 (H) 15 - 41 U/L   ALT 59 (H) 0 - 44 U/L   Alkaline Phosphatase 78 38 - 126 U/L   Total Bilirubin 0.2 0.0 - 1.2 mg/dL   GFR, Estimated >09 >60 mL/min    Comment: (NOTE) Calculated using the CKD-EPI Creatinine Equation (2021)    Anion gap 9 5 - 15    Comment: Performed at [facility], [address] [place], [place] [address]  CBC with Differential/Platelet     Status: Abnormal   Collection Time: 05/09/23  6:18 AM  Result Value Ref Range   WBC 9.4 4.0 - 10.5 K/uL   RBC 3.28 (L) 3.87 -  5.11 MIL/uL   Hemoglobin 8.8 (L) 12.0 - 15.0 g/dL   HCT 81.1 (L) 91.4 - 78.2 %   MCV 85.4 80.0 - 100.0 fL   MCH 26.8 26.0 - 34.0 pg   MCHC 31.4 30.0 - 36.0 g/dL   RDW 95.6 21.3 - 08.6 %   Platelets 454 (H) 150 - 400 K/uL   nRBC 0.0 0.0 - 0.2 %   Neutrophils Relative % 55 %   Neutro Abs 5.2 1.7 - 7.7 K/uL   Lymphocytes Relative 27 %   Lymphs Abs 2.6 0.7 - 4.0 K/uL   Monocytes Relative 6 %   Monocytes Absolute 0.6 0.1 - 1.0 K/uL   Eosinophils Relative 9 %   Eosinophils Absolute 0.8 (H) 0.0 - 0.5 K/uL   Basophils Relative 0 %   Basophils Absolute 0.0 0.0 - 0.1 K/uL   Immature Granulocytes 3 %   Abs Immature Granulocytes 0.24 (H) 0.00 - 0.07 K/uL    Comment: Performed at [facility], [address] [place], [place] [address]  Magnesium     Status: None   Collection Time: 05/09/23  6:18 AM  Result Value Ref Range   Magnesium 2.0 1.7 - 2.4 mg/dL    Comment: Performed at [facility], [address], [place], [place] [address]  Procalcitonin     Status: None   Collection Time: 05/09/23  6:18 AM  Result Value Ref Range   Procalcitonin <0.10 ng/mL    Comment:        Interpretation: PCT (Procalcitonin) <= 0.5 ng/mL: Systemic infection (sepsis) is not likely. Local bacterial infection is possible. (NOTE)       Sepsis PCT Algorithm           Lower Respiratory Tract                                      Infection PCT Algorithm    ----------------------------     ----------------------------         PCT < 0.25 ng/mL                PCT < 0.10 ng/mL          Strongly encourage             Strongly discourage   discontinuation of antibiotics    initiation of antibiotics    ----------------------------     -----------------------------  PCT 0.25 - 0.50 ng/mL            PCT 0.10 - 0.25 ng/mL               OR       >80% decrease in PCT            Discourage initiation of                                            antibiotics      Encourage discontinuation           of  antibiotics    ----------------------------     -----------------------------         PCT >= 0.50 ng/mL              PCT 0.26 - 0.50 ng/mL               AND        <80% decrease in PCT             Encourage initiation of                                             antibiotics       Encourage continuation           of antibiotics    ----------------------------     -----------------------------        PCT >= 0.50 ng/mL                  PCT > 0.50 ng/mL               AND         increase in PCT                  Strongly encourage                                      initiation of antibiotics    Strongly encourage escalation           of antibiotics                                     -----------------------------                                           PCT <= 0.25 ng/mL                                                 OR                                        > 80% decrease in PCT  Discontinue / Do not initiate                                             antibiotics  Performed at [facility], [address], [place], [place] [address]     Diabetic Foot Exam:     PHQ2/9:    05/21/2023    1:07 PM 03/17/2023    1:12 PM 02/25/2023    3:04 PM 12/24/2022   10:10 AM 12/14/2022   10:03 AM  Depression screen PHQ 2/9  Decreased Interest 2  2 0 2  Down, Depressed, Hopeless 2  2 0 1  PHQ - 2 Score 4  4 0 3  Altered sleeping 2  2  1   Tired, decreased energy 2  2  3   Change in appetite 2  1  0  Feeling bad or failure about yourself  1  0  1  Trouble concentrating 0  0  0  Moving slowly or fidgety/restless 0  0  0  Suicidal thoughts 0  0  0  PHQ-9 Score 11  9  8   Difficult doing work/chores Somewhat difficult  Somewhat difficult       Information is confidential and restricted. Go to Review Flowsheets to unlock data.    phq 9 is positive due to problems with her breast   Fall Risk:    05/21/2023    1:07 PM 04/29/2023    9:46 AM  02/25/2023    3:03 PM 12/24/2022   10:10 AM 12/14/2022    9:58 AM  Fall Risk   Falls in the past year? 0 0 1 0 1  Number falls in past yr: 0  0 0 1  Injury with Fall? 0  0 0 0  Risk for fall due to : No Fall Risks No Fall Risks History of fall(s)  No Fall Risks  Follow up Falls prevention discussed;Education provided;Falls evaluation completed Falls prevention discussed Falls evaluation completed  Falls prevention discussed     Assessment and Plan    Post-operative breast reduction complications New induration and oozing from the left breast wound site, increased pain, and new puffiness since last seen by the surgeon two days ago. Concern for possible infection or abscess formation. -Urgent referral to the operating surgeon for evaluation today. -If unable to see the surgeon, patient should present to the emergency department for labs and possible ultrasound to rule out abscess formation.      She is feeling stressed and depressed due to the complications post op. Tried to reassure her that she will improve, to keep being her own advocate and to know that the surgeons also want her to improve
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
## 2023-05-24 ENCOUNTER — Encounter: Payer: Medicare Other | Admitting: Physician Assistant

## 2023-05-24 ENCOUNTER — Ambulatory Visit (INDEPENDENT_AMBULATORY_CARE_PROVIDER_SITE_OTHER): Payer: Medicare Other | Admitting: Plastic Surgery

## 2023-05-24 DIAGNOSIS — Z9889 Other specified postprocedural states: Secondary | ICD-10-CM

## 2023-05-24 NOTE — Progress Notes (Signed)
Catherine Berg returns today for follow-up after a bilateral breast reduction complicated by wound infection in the left breast.  She is doing well today and only notes pain in the left breast which is improving.  She denies any fevers chills nausea vomiting.  She is eating and drinking with bowel movements without difficulty.  She is afebrile.  On physical exam the breasts actually look quite nice with good symmetry.  The nipples are warm and well-perfused.  She does still have 2 areas on the vertical incisions which had wound breakdown and are now reepithelialized seen.  There is minimal drainage from the incisions.  On palpation of the left breast I am not able to express any drainage.  There is firmness from the scar tissue which is expected since she had a reoperation and infection in the left breast.  Status post breast reduction, wound infection: Patient is doing very well.  I have encouraged her to start increasing her activity.  She may shower and she may wash the breasts including light scrubbing of the incisions.  We discussed the importance of keeping the areas which are reepithelialized and moist.  She will massage the remainder of the breast and scars.  Follow-up next week and if doing well we will stretch her follow-up out to 3 to 4 weeks.

## 2023-05-27 ENCOUNTER — Encounter: Payer: Self-pay | Admitting: Psychiatry

## 2023-05-27 ENCOUNTER — Ambulatory Visit (INDEPENDENT_AMBULATORY_CARE_PROVIDER_SITE_OTHER): Payer: Medicare Other | Admitting: Psychiatry

## 2023-05-27 ENCOUNTER — Encounter: Payer: Medicare Other | Admitting: Physician Assistant

## 2023-05-27 VITALS — BP 120/82 | HR 74 | Temp 98.3°F | Ht 63.0 in | Wt 229.0 lb

## 2023-05-27 DIAGNOSIS — F4310 Post-traumatic stress disorder, unspecified: Secondary | ICD-10-CM | POA: Diagnosis not present

## 2023-05-27 DIAGNOSIS — F330 Major depressive disorder, recurrent, mild: Secondary | ICD-10-CM | POA: Diagnosis not present

## 2023-05-27 DIAGNOSIS — F411 Generalized anxiety disorder: Secondary | ICD-10-CM

## 2023-05-27 MED ORDER — TRAZODONE HCL 50 MG PO TABS: 50.0000 mg | ORAL_TABLET | Freq: Every evening | ORAL | 3 refills | Status: DC | PRN

## 2023-05-27 NOTE — Patient Instructions (Signed)
Cnotinue duloxetine 60 mg daily  Continue buspar 5 mg twice a day  Continue trazodone 50 mg at night as needed for insomnia Continue hydroxyzine 10 mg daily as needed for anxiety  Next appointment: 3/25 at 3 PM

## 2023-05-30 ENCOUNTER — Encounter: Payer: Self-pay | Admitting: Plastic Surgery

## 2023-05-31 ENCOUNTER — Ambulatory Visit (INDEPENDENT_AMBULATORY_CARE_PROVIDER_SITE_OTHER): Payer: Medicare Other | Admitting: Licensed Clinical Social Worker

## 2023-05-31 ENCOUNTER — Ambulatory Visit (INDEPENDENT_AMBULATORY_CARE_PROVIDER_SITE_OTHER): Payer: Medicare Other | Admitting: Physician Assistant

## 2023-05-31 VITALS — BP 137/81 | HR 84 | Temp 98.3°F

## 2023-05-31 DIAGNOSIS — F4310 Post-traumatic stress disorder, unspecified: Secondary | ICD-10-CM | POA: Diagnosis not present

## 2023-05-31 DIAGNOSIS — F330 Major depressive disorder, recurrent, mild: Secondary | ICD-10-CM

## 2023-05-31 DIAGNOSIS — F411 Generalized anxiety disorder: Secondary | ICD-10-CM | POA: Diagnosis not present

## 2023-05-31 DIAGNOSIS — Z9889 Other specified postprocedural states: Secondary | ICD-10-CM

## 2023-05-31 MED ORDER — AMOXICILLIN-POT CLAVULANATE 875-125 MG PO TABS: 1.0000 | ORAL_TABLET | Freq: Two times a day (BID) | ORAL | 0 refills | Status: DC

## 2023-05-31 MED ORDER — FLUCONAZOLE 150 MG PO TABS: 150.0000 mg | ORAL_TABLET | Freq: Every day | ORAL | 0 refills | Status: DC

## 2023-05-31 NOTE — Progress Notes (Signed)
   THERAPIST PROGRESS NOTE  Session Time: 2:02pm-2:51pm  Participation Level: Active  Behavioral Response: CasualAlertEuthymic  Type of Therapy: Individual Therapy  Treatment Goals addressed: LTG: Reduce frequency, intensity, and duration of depression symptoms so that daily functioning is improved    STG: [name] will identify cognitive patterns and beliefs that support depression    LTG: "I want to be where the things I don't have control over, I can learn to let go."    ProgressTowards Goals: Progressing  Interventions: Supportive and Other: EMDR  Summary: Catherine Berg is a 43 y.o. female who presents with anxiety.  Patient identifies symptoms to include uncontrollable worry, negative self affect, low mood, anxious feelings, tearfulness, avoidance, restlessness. Pt was oriented times 5. Pt was cooperative and engaged. Pt denies SI/HI/AVH.      Patient utilized therapeutic space to process postoperative complications following her surgery.  Patient reflected on trauma as a result of time in the emergency department for 11 days and regrets postsurgery.  Patient reflected on lack of control she felt following emergent second surgery and feels she has declined both mentally and emotionally.  Patient identifies a lack of motivation and feels her brain fog has worsened since her hospitalization.  Discussed faith and how giving herself grace has been a coping skill in processing and healing from her medical trauma.  Patient reflected on distress as a result of transportation via ambulance stating "I am very scared of ambulances."  Patient also identifies triggers to her hospitalization as a result of COVID.  Discussed discussed unhappiness with her body postsurgery.  Clinician and patient discussed a diagnosis of adjustment disorder and symptoms that coincide with trauma.  Utilized EMDR to address initial symptoms and desensitize trauma symptoms.  Patient identified distress improved on a scale  of 0-10 from 10 to a score of 6.  Clinician also worked with patient to instill controllable factors.  Suicidal/Homicidal: Nowithout intent/plan  Therapist Response:  Cln utilized active and supportive reflection to create a safe environment for patient to process recent life stressors.  Clinician assessed for current symptoms, stressors, safety since last session.  Clinician utilized EMDR to process and desensitize emotions and sensations related to medical trauma following recent surgery.  Plan: Return again in 4 weeks.  Diagnosis: PTSD (post-traumatic stress disorder)  MDD (major depressive disorder), recurrent episode, mild (HCC)  GAD (generalized anxiety disorder)   Collaboration of Care: AEB psychiatrist can access notes and cln. Will review psychiatrists' notes. Check in with the patient and will see LCSW per availability. Patient agreed with treatment recommendations.  Patient/Guardian was advised Release of Information must be obtained prior to any record release in order to collaborate their care with an outside provider. Patient/Guardian was advised if they have not already done so to contact the registration department to sign all necessary forms in order for [place] to release information regarding their care.   Consent: Patient/Guardian gives verbal consent for treatment and assignment of benefits for services provided during this visit. Patient/Guardian expressed understanding and agreed to proceed.   Dereck Leep, LCSW 05/31/2023

## 2023-05-31 NOTE — Addendum Note (Signed)
Addended by: [name] on: 05/31/2023 11:31 AM   Modules accepted: Orders

## 2023-05-31 NOTE — Progress Notes (Signed)
Patient is a pleasant 43 year old female with history of bilateral breast reduction 04/13/2023 complicated by left breast abscess requiring OR washout 05/03/2023 who presents to clinic for postoperative follow-up.   She was last seen in clinic on 05/24/2023 by Dr. Ladona Ridgel.  At that time, exam was largely benign.  No expressible drainage upon palpation.  Firmness from the scar tissue which was anticipated given for reoperation after infection.  Recommended mechanical massage and follow-up in 3 to 4 weeks.  Today, patient expresses concern for recurrent left breast infection.  She told me that over the weekend she has had slight worsening in the left breast discomfort that she started at last evaluation.  She also reports that towards the middle of her chest on the left breast there has been a slight opening in her incision with what appeared to be a purulent drainage.  She denies any systemic symptoms such as fevers, chills, or other symptoms.  Patient wanted to err on the side of caution and be evaluated given her previous postoperative complications.  On exam, breasts have good shape and symmetry.  NAC's are healthy and viable.  Inferior T zone incisional wounds appear to be healing well on each breast.  No surrounding erythema or induration.  Mild TTP along left inframammary incision.  No objective fluid collections or asymmetric swelling.  Medially on the left breast where there had been wrinkling during closure, there is a small, 0.2 cm incisional wound.  Upon palpation there is a thin yellowish translucent expressible drainage.  Minimal, no significant output.  See photos.  Suspect that she had a small suture abscess on the most medial aspect of her left inframammary incision.  While these are often relatively benign and self-limiting, given her history will cover with systemic antibiotics.  While she had anaerobes cultured during previous infection, will cover for typical skin flora.  Discussed case with  Dr. Ladona Ridgel who personally evaluated patient and agrees with assessment and plan.  Dressed her inferior T zone wound with bordered Mepilex dressings and will apply 4 x 4 gauze to the small opening medially to capture any drainage.  These gauze dressings can be replaced as needed and are secured with her compressive garments.  Will try to avoid tape given her recent tape dermatitis.  Patient to follow-up later this week, sooner if needed.  All questions answered and patient is agreeable to plan.  Picture(s) obtained of the patient and placed in the chart were with the patient's or guardian's permission.   Today's Vitals   05/31/23 1046  BP: 137/81  Pulse: 84  Temp: 98.3 F (36.8 C)  SpO2: 100%   There is no height or weight on file to calculate BMI.

## 2023-05-31 NOTE — Progress Notes (Deleted)
Patient is a pleasant 43 year old female with history of bilateral breast reduction 04/13/2023 complicated by left breast abscess requiring OR washout 05/03/2023 who presents to clinic for postoperative follow-up.  She was just seen here in clinic on 05/31/2023.  At that time, she had noticed some drainage from the medial aspect of left inframammary incision and Augmentin was called in to help cover for infection.  Exam was overall benign and it was suspected suture abscess.    Today,

## 2023-06-02 ENCOUNTER — Encounter: Payer: Medicare Other | Admitting: Physician Assistant

## 2023-06-04 ENCOUNTER — Ambulatory Visit (INDEPENDENT_AMBULATORY_CARE_PROVIDER_SITE_OTHER): Payer: Medicare Other | Admitting: Physician Assistant

## 2023-06-04 VITALS — BP 137/86 | HR 69

## 2023-06-04 DIAGNOSIS — Z9889 Other specified postprocedural states: Secondary | ICD-10-CM

## 2023-06-04 NOTE — Progress Notes (Cosign Needed)
Patient is a pleasant 43 year old female with history of bilateral breast reduction 04/13/2023 complicated by left breast abscess requiring OR washout 05/03/2023 by Dr. Ladona Ridgel who presents to clinic for postoperative follow-up.  She was just seen here in clinic on 05/31/2023.  At that time, she had noticed some drainage from the medial aspect of left inframammary incision and Augmentin was called in to help cover for infection.  Exam was overall benign and it was suspected suture abscess.    Today, patient is doing okay.  She states that she still has some mild drainage from the small pinpoint incisional wound medially on left breast.  She is taking the Augmentin, as directed.  She noticed a small, dime sized bruise on the most superior aspect of right breast, but reports that it is nontender.  Her inferior T zone wounds are a bit tender bilaterally, but reportedly improving.  She also was bothered by what seemed like asymmetric swelling on the most lateral aspect of left breast.  She denies any fevers or other issues.  On exam, breasts have good shape and symmetry.  The concern for asymmetric swelling on most lateral aspect of left breast is likely due to that side being accentuated by scarring from drain tube insertion site on the left.  Do not appreciate any significant swelling or palpable underlying fluid collections.  No overlying skin changes in that area is nontender.  She does have a bit of tenderness medially at the site of her pinpoint wound from which there is still a small amount of drainage intermittently.  She will continue to cover with 4 x 4 gauze.  Her inferior T zone wounds are healing appropriately, nonconcerning on exam.  Recommending continued Vaseline and bordered Mepilex dressings to inferior T zone's bilaterally.  As for the pinpoint wound medially on the left breast, recommend continued 4 x 4 gauze.  Recommend that she finish the course of Augmentin and follow-up in 1 week.  Will  obtain photos at that time.  Overall appears to be healing appropriately from her recent surgery, but will continue to exercise caution given her previous infection and complicated postoperative course.

## 2023-06-09 NOTE — Progress Notes (Signed)
 Patient is a pleasant 43 year old female with history of bilateral breast reduction 04/13/2023 complicated by left breast abscess requiring OR washout 05/03/2023 by Dr. Waddell who presents to clinic for postoperative follow-up.   She was last seen here in clinic on 06/04/2023.  At that time, she was taking Augmentin  given concern for suspected suture abscess medially on left inframammary incision.  Exam was largely benign.  Pinpoint wound over medial aspect of left inframammary incision with small amount of thin drainage expressed with palpation.  Recommended close follow-up.  Today, patient is doing well.  She reports diminished drainage from the small pinpoint wound on the most medial aspect of inframammary incision.  She continues to endorse mild discomfort described as sharp on the medial aspect of left breast above the inframammary incision.  She also reports that she sometimes hurts in both breasts.  She also has a couple of sutures that are protruding bothersome that she hopes can be removed.  She denies any leg swelling, chest pain, difficulty breathing, fevers, or other concerns.  She is continue to apply Vaseline to the inframammary incisions bilaterally.  On exam, breasts have good shape and symmetry.  Soft throughout.  No palpable areas of firmness.  There is mild scar hypertrophy at the left inferior T zone.  Her small, pinpoint wound medially has resolved.  No expressible drainage with palpation.  Her skin is without any significant erythema or other overlying skin changes.  No crepitus or induration.  NAC's are healthy and viable.  No palpable subcutaneous fluid collections.  No evidence concerning for infection today.  She is doing an excellent job keeping everything clean and well-managed.  Do not feel as though antibiotics need to be extended.  Instead, patient understands what to watch for in terms of concerning signs/symptoms.  She will not hesitate to call the office should she have any  questions or concerns.  Otherwise, plan is for her to follow-up in 2 weeks.  Recommend continued Vaseline to inframammary incisions bilaterally as well as gentle mechanical massage to help with the area of hypertrophic scarring left inferior T zone.    Picture(s) obtained of the patient and placed in the chart were with the patient's or guardian's permission.

## 2023-06-10 ENCOUNTER — Ambulatory Visit: Payer: Medicare Other | Admitting: Physician Assistant

## 2023-06-10 VITALS — BP 126/83 | HR 82

## 2023-06-10 DIAGNOSIS — Z9889 Other specified postprocedural states: Secondary | ICD-10-CM

## 2023-06-16 ENCOUNTER — Encounter: Payer: Medicare Other | Attending: Physical Medicine and Rehabilitation | Admitting: Psychology

## 2023-06-16 DIAGNOSIS — F4312 Post-traumatic stress disorder, chronic: Secondary | ICD-10-CM | POA: Diagnosis present

## 2023-06-16 DIAGNOSIS — G5793 Unspecified mononeuropathy of bilateral lower limbs: Secondary | ICD-10-CM | POA: Insufficient documentation

## 2023-06-16 DIAGNOSIS — F330 Major depressive disorder, recurrent, mild: Secondary | ICD-10-CM | POA: Diagnosis present

## 2023-06-16 DIAGNOSIS — U099 Post covid-19 condition, unspecified: Secondary | ICD-10-CM | POA: Insufficient documentation

## 2023-06-16 DIAGNOSIS — G9332 Myalgic encephalomyelitis/chronic fatigue syndrome: Secondary | ICD-10-CM | POA: Insufficient documentation

## 2023-06-16 DIAGNOSIS — R439 Unspecified disturbances of smell and taste: Secondary | ICD-10-CM | POA: Insufficient documentation

## 2023-06-16 DIAGNOSIS — R413 Other amnesia: Secondary | ICD-10-CM | POA: Insufficient documentation

## 2023-06-16 NOTE — Progress Notes (Addendum)
Neuropsychological Evaluation   Patient:  Catherine Berg   DOB: 10/14/1980  MR Number: [id number]  Location: [facility] [address] [place], [address] [place] [address] Dept: [phone]  Start: 8 AM End: 9 AM  Provider/Observer:     Hershal Coria PsyD  Chief Complaint:     No chief complaint on file.   Reason For Service:     Catherine Berg is a 43 year old female that was referred for neuropsychological evaluation a couple of years ago.  Because of multiple reschedules and various issues there is been considerable delay in completing this evaluation.  I initially saw the patient on 03/18/2021 and the patient returned for formal testing in the spring 2024.  Since the time I initially saw the patient she has continued to be seen by Dr. Vanetta Shawl with psychiatry as well as working with Weldon Inches, LCSW for therapeutic interventions around PTSD, depression and insomnia.  Patient has continued to have nightmare type dreams with content of dying and other negative content.  Patient is also described continued difficulties with word finding difficulties and memory issues.  Patient has stated that any sickness or illness restart fearful sensations that she is probably "going to die."  Patient has had difficulty transitioning to new normal where she has now been approved for her Social Security disability but really wants to be able to work but knows that her difficulties communicating with people really complicate that capacity.  Back at the end of December the patient had bilateral breast reduction surgery that she was expecting to be rather routine without complication but developed infection in her left breast that required readmission to the hospital and caused a great deal of distress as it reminded her of her hospitalization during severe COVID complications.   (2022 clinical interview)  Catherine Berg is a 43 year old female who is referred for neuropsychological evaluation by her treating physical medicine physician Sarina Ill, MD. the patient is also been followed by psychiatry with Dr. Vanetta Shawl for depression as well.  The patient has been dealing with long COVID-like symptoms including memory deficits, fatigue, significant pain in bilateral lower extremities.  The patient also has increasing depression and anxiety symptomatology but had been having some depression and bereavement issues prior to the development of significant COVID.  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  The patient developed COVID on September 2021 and has continued to struggle with post viral fatigue, change in sense of smell, cognitive decline, lower pain/neuropathy, joint aches, post exercise fatigue, shortness of breath with activity.  She is also showing PTSD-like symptoms and a worsening of her depression that predated her COVID.  The patient reports that her sense of smell has improved but continues to be about 75% estimated by the patient as far as functioning.  The patient reports that she did not have significant illness until her diagnosis in September 2021.  The patient reports that she feels stuck in life and was very capable with a good memory prior to COVID.  The patient reports that she feels like she lived a decent life.  Patient reports that she was very independent and never needed  to take notes or any type of memory aids.  Patient reports that she feels like she remembered everything and now there is some days she goes to places and gets "stuck" and has trouble  knowing what to do when she is there or why she is there.  The patient reports that she is having difficulty remembering to pay bills and keeping up with other paperwork.  She reports that she will repetitively go through papers trying to figure things out.  The patient reports that she will intellectually know how to do something but just cannot get it completed and know exactly what to do.  She reports that while she knows what to do she cannot follow through on the specifics or how to execute.  The patient reports an example such as cooking dinner.  She reports that she will cook a recipe and have it done but it would not come out right she would not be able to figure out what went wrong.  She describes her memory difficulties as having difficulty remembering or retrieving information and she will know something has happened but cannot retrieve the details.  She has been asking others to write information down so she can read it and having a hard copy or reminders as needed.  She reports that she forgets what time to take her medicines and has a very difficult time keeping up with these aspects versus past.  Patient describes other difficulties including problem solving and executive function issues but not sure how much her memory difficulties impact this.  The patient reports that she has trouble talking to people on the phone or in person and does not want to have to explain what her difficulties are and tends to avoid being around others.  She reports that she spends about 90% of her day isolated.  The patient reports that sometimes her depression is very significant and she feels like she is getting into "a dark space."  The patient reports that she has times where she feels overwhelmed thinking that she is never going to "feel normal again."  She reports that everything feels different than in the past.  The patient reports that the experiences around COVID have lingered psychologically.  The patient  reports that when she got COVID she was very sick and had to be isolated for 14 days.  She never developed COVID-pneumonia though.  The patient reports that being isolated for this extended period of time really "did something to me."  She reports that she struggled being away from others.  The patient reports that her mother passed away shortly before the patient became sick and when she was isolated she became overwhelmed with this darkness and loss of her mother.  The patient reports that she is struggled with feeling isolated and alone and unable to function properly.  The patient reports that she sleeps about 5 or 6 hours each night.  She was assessed for sleep apnea with Dr. Vickey Huger and was not found to have sleep apnea.  The patient reports that she tends to sleep in a recliner chair semiupright at night.  The patient reports that there is a change in her appetite and she mostly just eats at dinnertime.  The patient reports that the taste of food is different and that her smell has never fully returned.  She reports that she has to rely more on her children to help her out and that she has not been able to work anymore.  Current  psychotropic medications include Wellbutrin and Cymbalta and that they are trying Cymbalta and now addition of gabapentin for her depression as well as her lower leg pain.  The patient has had CT head performed in 2012 due to left-sided headache and facial pain.  The CT found no evidence of acute intercranial abnormality but this was back in 2012.  Tests Administered: Wechsler Adult Intelligence Scale, 4th Edition (WAIS-IV) Wechsler Memory Scale, 4th Edition (WMS-IV); Adult Battery Comprehensive Attention Battery (CAB) Continuous Performance Test (CPT)  Participation Level:   Active  Participation Quality:  Appropriate      Behavioral Observation:  The patient appeared well-groomed and appropriately dressed. Her manners were polite and appropriate to the situation.  The patient's attitude towards testing was positive and her effort was good.   Well Groomed, Alert, and Appropriate.   Test Results:   Initially, an estimation was made as to the patient's premorbid intellectual and cognitive functioning using various psychosocial and educational variables.  The patient completed the 11th grade maintaining a rather low GPA.  The patient does report that she always did very well in reading and reading comprehension but did have some relative difficulty with math.  No formal education beyond that was noted.  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  The patient has not been able to return to work after her COVID illness and is now disabled and has gone through the Social Security disability process.  We will utilize a conservative estimate of premorbid functioning to be in the lower end of the average range to low average range for comparison purposes.  Secondly, an estimation was made as to the validity of the current assessment procedures.  Embedded validity checks including aspects of pure reaction time measures, forced choice items and attentional variables do suggest that the patient approach this assessment in an honest and straightforward manner but clearly had difficulties with cognition on a number of cognitive domains.  Initially, the patient was administered the comprehensive attention battery and CAB CPT measures.  On the pure auditory and visual reaction time measures the patient did well for accuracy responses once she had become familiarized with the pressure sensitive to touch screen utilized in this measure.  The patient had a few errors of omission at the start of the measure as she was somewhat apprehensive and firmly giving a response.  However, after the very beginning of the measure she ended up achieving 100% accuracy in the last two thirds  of the pure visual reaction time measure and 100% accuracy in the auditory reaction time measure.  The patient did regularly second-guess whether her responses were being registered producing several multiple responses.  However, this appears to be responding a second time to make sure that it registered her response rather than impulse control issues.  The patient's average response time for correct responses was well within normal limits.  On the visual pure reaction time measure she average 324 ms and on the auditory pure reaction time measure she average 385 ms both of which are right after normative expectations.  The patient was then administered the discriminate reaction time measures.  On the visual discriminate reaction time measure the patient had numerous errors of omission as well as errors of commission.  The patient had difficulty transitioning and adjusting to changing stimuli.  While her average response time was within normal limits she missed numerous  items and there were clear indications of lapses of attention and improper responding.  This was also true on the auditory discriminate reaction time measure and shift discriminant reaction time measures where she had numerous loss sets and lost track of what target she was to be responding to.  While when she did accurately respond to items her response time was within normal limits she had numerous errors throughout this measure.  On the auditory/visual scan reaction time measures her performance is improved with less errors of omission and errors of commission.  Average response times were slow indicating difficulty quickly processing information even when it is just a little bit more demanding then.  Go no go tests.  On the auditory visual encoding measures the patient also had significant difficulty both with primary auditory and visual encoding as well as actively processing information in her auditory Register.  This was, in fact, one of  her more challenging areas relating to attention and concentration and was also found to be impaired on similar measures from the Wechsler Adult Intelligence Scale and Wechsler Memory Scale's.  On the Stroop interference cancellation test the patient showed difficulty with actively processing information and impaired focus execute abilities and reduced overall speed of mental operations.  While she showed steady performance at the lower end of the average range to mildly impaired range on the noninterference trials the patient showed initial difficulty adjusting to targeted auditory interference but actually improved under the targeted interference trials with time.  This suggest that the primary weakness on this particular measure had to do with information processing speed rather than an inability to remain free from external distractors.  Finally, the patient was administered the visual monitor CPT measure from the comprehensive attention battery.  This is a 15-minute continuous performance measure that is a discriminant reaction time challenge.  It is broken down into five 3-minute blocks of time for analysis.  As this measure utilizes a keyboard rather than touch screen interface or errors of omission could not be attributed simply to apprehension or difficulty with the user interface.  The patient showed significant deficits across the entire 15 minutes of the task and displayed these deficits from the very beginning including impulsive responses and errors of commission and numerous errors of omission.  There was significant variability throughout the 15-minute task with regard to response times and is clear that the patient got "lost in the task" at various times rather than showing steady performance or steady decline across the 15-minute challenge.  This is a very simple task in general but the patient had difficulty staying on task and remembering what the actual challenge was.  These deficits are not  related to deficits sustaining attention but rather cognitive difficulties throughout.  Results: WAIS-IV:            Composite Score Summary          Scale Sum of Scaled Scores Composite Score Percentile Rank 95% Conf. Interval Qualitative Description  Verbal Comprehension 17 VCI 76 5 71-83 Borderline  Perceptual Reasoning 18 PRI 77 6 72-84 Borderline  Working Memory 15 WMI 86 18 80-94 Low Average  Full Scale 62 FSIQ 74 4 70-79 Borderline  General Ability 35 GAI 74 4 70-80 Borderline    The patient was administered the Wechsler Adult Intelligence Scale to provide a thorough assessment of a broad range of cognitive domains.  As the patient has been describing changes in cognition the current score should not be seen as a  reflection of her lifelong premorbid capacities but an objective description of her current status.  2 Global composite scores were calculated in this measure.  The patient produced a full-scale IQ score of 74 which falls at the 4th percentile and in the borderline range relative to normative comparisons.  This performance is below predicted levels of premorbid intellectual and cognitive functioning suggesting changes in 1 or more cognitive domains.  We also calculated the patient's general abiliy index score where she produced a composite score of 74 which also fell at the 4th percentile and in the borderline range.  This suggest that cognitive deficits are rather consistent throughout all cognitive domains rather than being primarily related to attention and information processing speed variables.          Verbal Comprehension Subtests Summary        Subtest Raw Score Scaled Score Percentile Rank Reference Group Scaled Score SEM  Similarities 14 4 2 5  1.04  Vocabulary 22 6 9 7  0.73  Information 9 7 16 8  0.85    The patient produced a verbal comprehension index score of 76 which falls at the 5th percentile and in the borderline range relative to a normative population.   This is below predicted levels and there was considerable variability in subtest performance.  The patient showed low average to mild impairments with regard to her vocabulary knowledge and general fund of information and showed particular deficits with regard to her verbal reasoning and problem-solving capacities.           Perceptual Reasoning Subtests Summary        Subtest Raw Score Scaled Score Percentile Rank Reference Group Scaled Score SEM  Block Design 28 7 16 6  0.99  Matrix Reasoning 7 4 2 3  0.95  Visual Puzzles 10 7 16 7  1.04    The patient produced a perceptual reasoning index score of 77 which falls at the 6 percentile and also in the borderline range relative to normative population.  Again, there was considerable variability in subtest performance with the patient performing at the lower end of the average range with mild difficulties related to hold part recognition skills and ability to analyze geometric patterns as well as visual fluid reasoning skills and visual processing skills.  The patient had her greatest difficulty on perceptual organization and broad visual intelligence type measures.          Working Comptroller Raw Score Scaled Score Percentile Rank Reference Group Scaled Score SEM  Digit Span 24 8 25 8  0.73  Arithmetic 10 7 16 7  0.99    Consistent with findings from the comprehensive attention battery the patient displayed difficulties with primary auditory encoding although this was not significantly impaired.  She did have some greater difficulties actively processing information in her auditory store.  However, this was one of her best areas of performance relatively speaking and is clear that cognitive demands that require more processing and rapid/focus execute abilities create more difficulties.          Processing Speed Subtests Summary        Subtest Raw Score Scaled Score Percentile Rank Reference Group Scaled Score SEM  Symbol  Search 21 6 9 6  1.56    The patient showed mild to moderate deficits on information processing speed and focus execute abilities on the comprehensive attention battery and this continued with performance on similar measures from the Wechsler Adult Intelligence Scale.  The  patient performed at the 9th percentile and the symbol search subtest suggesting slower information processing speeds and difficulties with visual scanning, visual searching and overall speed of mental operations.  WMS-IV:            Index Score Summary        Index Sum of Scaled Scores Index Score Percentile Rank 95% Confidence Interval Qualitative Descriptor  Auditory Memory (AMI) 21 72 3 67-80 Borderline  Visual Memory (VMI) 24 76 5 71-83 Borderline  Visual Working Memory (VWMI) 14 83 13 77-91 Low Average  Immediate Memory (IMI) 22 70 2 65-78 Borderline  Delayed Memory (DMI) 23 70 2 65-79 Borderline      As the patient has been describing difficulties with memory along with attention and concentration she was administered the Wechsler Memory Scale-IV to provide a thorough assessment of multiple memory components.  On the Wechsler Adult Intelligence Scale and the comprehensive attention battery the patient displayed mild difficulties with auditory encoding but it was still one of her better areas of functioning from a global perspective.  The patient showed a very similar pattern with regard to visual encoding.  On the Wechsler Memory Scale she produced a visual working memory index score of 83 which falls at the 13th percentile and is in the low average range.  These levels of auditory and visual encoding would produce some impact on her capacity to learn new information but would not be deficits at a level that would negate her capacity to learn new information.  Breaking memory functions down between auditory versus visual the patient produced an auditory memory index score of 72 which falls at the 3rd percentile and this  is in the borderline range relative to a normative population.  Similar performance was noted for visual memory producing a visual memory index score of 76, which fell at the 5th percentile in the borderline range as well.  These are generally consistent score suggesting that auditory and visual memory are both problematic with the patient having equal difficulty in learning both auditory and visual information.  Breaking memory components down between immediate versus delayed the patient produced an immediate memory index score of 70 which falls at the 2nd percentile.  She produced the same level of performance on the delayed memory index score.  This would suggest that the deficits have less to do with an inability to retain information over a period of delay but more of a primary difficulty with encoding, storing and organizing new information and initially.  The patient showed little improvement under recognition/cued recall formats highlighting this difficulty primarily around storage and organization rather than an inability to simply retrieve information that it actually been stored and organized.         Primary Subtest Scaled Score Summary       Subtest Domain Raw Score Scaled Score Percentile Rank  Logical Memory I AM 15 5 5   Logical Memory II AM 10 5 5   Verbal Paired Associates I AM 14 5 5   Verbal Paired Associates II AM 6 6 9   Designs I VM 43 4 2  Designs II VM 35 5 5  Visual Reproduction I VM 33 8 25  Visual Reproduction II VM 17 7 16   Spatial Addition VWM 10 7 16   Symbol Span VWM 16 7 16             Auditory Memory Process Score Summary      Process Score Raw Score Scaled Score Percentile Rank Cumulative Percentage (  Base Rate)  LM II Recognition 26 - - 51-75%  VPA II Recognition 31 - - 3-9%           Visual Memory Process Score Summary      Process Score Raw Score Scaled Score Percentile Rank Cumulative Percentage (Base Rate)  DE I Content 27 5 5  -  DE I Spatial 12 6 9  -   DE II Content 24 5 5  -  DE II Spatial 9 7 16  -  DE II Recognition 11 - - 3-9%  VR II Recognition 4 - - 10-16%    ABILITY-MEMORY ANALYSIS   Ability Score:    VCI: 76 Date of Testing:           WAIS-IV; WMS-IV 2022/09/01             Predicted Difference Method    Index Predicted WMS-IV Index Score Actual WMS-IV Index Score Difference Critical Value   Significant Difference Y/N Base Rate  Auditory Memory 88 72 16 8.91 Y 10-15%  Visual Memory 89 76 13 8.02 Y 15-20%  Visual Working Memory 87 83 4 11.90 N    Immediate Memory 86 70 16 9.48 Y 10%  Delayed Memory 88 70 18 10.18 Y 5-10%    Summary of Results:   Overall, the results of the objective assessment do provide a picture of global cognitive decline.  The patient has mild deficits with regard to primary attentional variables with significant lapses of attention, slowed information processing speed as more information or more dynamic processing is required, and significant difficulty with shifting and adjusting to changing aspects of attentional focus.  The patient is showing difficulties in cognitive functioning from a global standpoint with reductions in global cognitive functioning from estimates of premorbid functioning.  Auditory and visual memory are both impaired relative to predicted levels of premorbid functioning with mild to moderate deficits noted for her capacity to store and organize new information.  Her immediate recall is more impaired than her auditory or visual encoding performances would predict.  While the limited information that the patient does initially recall is retained over a period of delay the amount she initially stores and organizes is below predicted levels and would cause a significant impact on day-to-day functioning.  Recognition and retrieval types of challenges did not improve memory/recall highlighting the likelihood of difficulties with initially storing and organizing  information.  Impression/Diagnosis:   Overall, the results of the current neuropsychological evaluation do present with a fairly complicated picture.  The patient describes a sudden change in performance around the time she developed COVID-19 infection although the patient was never hospitalized or experience severe pulmonary distress or COVID-19 pneumonia.  She was isolated for an extended period of time.  The patient has continued with loss of her sense of smell without significant recovery over time.  The patient has significant PTSD and depressive symptoms that have included passive suicidal ideation in the past and continued nightmares and traumatic responses to situations particularly medically associated situations.  The patient continues with nightmares and flashbacks/avoidant types of symptoms.  Even with these emotional features the patient continues to report significant changes in overall cognition and has been unable to return to work.  She has displayed primary attentional deficits related to her capacity to shift attention, confusion, and difficulty inhibiting impulsive responses and then becoming tangled up by a vague awareness of mistakes.  From an attentional standpoint the patient essentially became her worst enemy where errors produced vigilance over potential  of new errors thus magnifying difficulties as the test or challenge would proceed.  The patient would be apprehensive at the beginning and then become entangled as a challenge would proceed.  This slowed information processing speed.  There is a global reduction in overall cognitive functioning with greatest deficits having to do with storage and organization of new information.  The patient was able to encode auditory and visual information in a generally adequate manner but unable to store and organize information even over a very brief duration.  What limited information is initially stored does remain stable over time but repeated  exposure of information does not particularly improve her performance.  Recognition and cueing does not particularly help bring back information.  From a diagnostic perspective the patient does appear to have a significant impact on cognition and giving her previous work history the current level of cognitive performances would be inconsistent with that type of work history.  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  The patient's current attentional weaknesses and memory weaknesses would render such type of work extremely difficult.  The patient's reports of current functioning would be consistent with the level of cognitive deficits displayed on the current assessment.  However, ongoing emotional disturbance, PTSD, anxiety and depression and sleep disturbance are likely playing a contributory role in her cognitive difficulties.  At this point there continues to be a need for further scientific study of long COVID type pathology in general as far as identifying a causative factor.  I suspect that the patient's current significant cognitive weaknesses are due to a multitude of factors including significant emotional distress, PTSD, anxiety and depression as well as impacts from her COVID-19 infection that have not recovered in a typical manner.  Severe depression and PTSD are quite problematic and are aspects that we should be addressing as they are clearly playing a role to some degree.  I will sit down with the patient go over the results of the current neuropsychological evaluation and will address specific recommendations around adapting to and adjusting to the types of cognitive weaknesses that are likely having the greatest impact on her day-to-day functioning.  The patient is being followed by psychiatry and also participating in therapeutic interventions  around her PTSD symptoms.  This should continue.  Neurology was consulted and the patient does not have symptoms consistent with sleep apnea etc.  Diagnosis:    Memory loss  COVID-19 long hauler manifesting chronic fatigue  Olfactory impairment  Neuropathy involving both lower extremities  MDD (major depressive disorder), recurrent episode, mild (HCC)  Chronic posttraumatic stress disorder   _____________________ Arley Phenix, Psy.D. Clinical Neuropsychologist

## 2023-06-22 ENCOUNTER — Encounter (HOSPITAL_BASED_OUTPATIENT_CLINIC_OR_DEPARTMENT_OTHER): Payer: Medicare Other | Admitting: Psychology

## 2023-06-22 DIAGNOSIS — F330 Major depressive disorder, recurrent, mild: Secondary | ICD-10-CM | POA: Diagnosis not present

## 2023-06-22 DIAGNOSIS — R413 Other amnesia: Secondary | ICD-10-CM | POA: Diagnosis not present

## 2023-06-22 DIAGNOSIS — U099 Post covid-19 condition, unspecified: Secondary | ICD-10-CM

## 2023-06-22 DIAGNOSIS — G9332 Myalgic encephalomyelitis/chronic fatigue syndrome: Secondary | ICD-10-CM | POA: Diagnosis not present

## 2023-06-22 DIAGNOSIS — F4312 Post-traumatic stress disorder, chronic: Secondary | ICD-10-CM

## 2023-06-23 NOTE — Progress Notes (Signed)
Neuropsychological Evaluation   Patient:  Catherine Berg   DOB: 1980/08/16  MR Number: [id number]  Location: [facility] [address] [place], [address] [place] [address] Dept: [phone]  Start: 10 AM End: 11 AM  Provider/Observer:     Hershal Coria PsyD  Chief Complaint:      Chief Complaint  Patient presents with   Memory Loss   Depression   Fatigue   Post-Traumatic Stress Disorder   06/22/2023: 10 AM-11 AM: Today's visit was an in person visit that was conducted in outpatient clinic office.  The patient was present.  Today we went over the results of the neuropsychological evaluation that is now been completed.  There were a number of delays including the patient losing her insurance after she lost the ability to work.  The patient has now been granted her Social Security disability status and has [employer], applications.  Today I went over the results of the evaluation and talked about specific recommendations for her going forward.  The patient indicated full understanding of the results of the evaluation and recommendations going forward.  She will continue with her current doctors and now that she can see them she hopes to be able to make some gains going forward.  Below I have included the reason for service and summary of my evaluation for convenience.  Her complete neuropsychological evaluative report can be found in her EMR dated 06/16/2023.   Reason For Service:     Catherine Berg is a 43 year old female that was referred for neuropsychological evaluation a couple of years ago.  Because of multiple reschedules and various issues there is been considerable delay in completing this evaluation.  I initially saw the patient on 03/18/2021 and the patient returned for formal testing in the spring 2024.  Since the time I initially saw the patient she has continued to be seen by Dr. Vanetta Shawl  with psychiatry as well as working with Weldon Inches, LCSW for therapeutic interventions around PTSD, depression and insomnia.  Patient has continued to have nightmare type dreams with content of dying and other negative content.  Patient is also described continued difficulties with word finding difficulties and memory issues.  Patient has stated that any sickness or illness restart fearful sensations that she is probably "going to die."  Patient has had difficulty transitioning to new normal where she has now been approved for her Social Security disability but really wants to be able to work but knows that her difficulties communicating with people really complicate that capacity.  Back at the end of December the patient had bilateral breast reduction surgery that she was expecting to be rather routine without complication but developed infection in her left breast that required readmission to the hospital and caused a great deal of distress as it reminded her of her hospitalization during severe COVID complications.   (2022 clinical interview) Catherine Berg is a 43 year old female who is referred for neuropsychological evaluation by her treating physical medicine physician Sarina Ill, MD. the patient is also been followed by psychiatry with Dr. Vanetta Shawl for depression as well.  The patient has been dealing with long COVID-like symptoms including memory deficits, fatigue, significant pain in bilateral lower extremities.  The patient also has increasing depression and anxiety symptomatology but had been having some depression and bereavement issues prior to the development of significant COVID.  The patient has a past medical history that  includes history of COVID-19, olfactory impairment subsequent to COVID-19, memory loss, physical deconditioning, vitamin D deficiency and vitamin B12 deficiency, sleeping difficulties and significant pain in lower extremities.  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  The patient developed COVID on September 2021 and has continued to struggle with post viral fatigue, change in sense of smell, cognitive decline, lower pain/neuropathy, joint aches, post exercise fatigue, shortness of breath with activity.  She is also showing PTSD-like symptoms and a worsening of her depression that predated her COVID.  The patient reports that her sense of smell has improved but continues to be about 75% estimated by the patient as far as functioning.  The patient reports that she did not have significant illness until her diagnosis in September 2021.  The patient reports that she feels stuck in life and was very capable with a good memory prior to COVID.  The patient reports that she feels like she lived a decent life.  Patient reports that she was very independent and never needed to take notes or any type of memory aids.  Patient reports that she feels like she remembered everything and now there is some days she goes to places and gets "stuck" and has trouble knowing what to do when she is there or why she is there.  The patient reports that she is having difficulty remembering to pay bills and keeping up with other paperwork.  She reports that she will repetitively go through papers trying to figure things out.  The patient reports that she will intellectually know how to do something but just cannot get it completed and know exactly what to do.  She reports that while she knows what to do she cannot follow through on the specifics or how to execute.  The patient reports an example such as cooking dinner.  She reports that she will cook a recipe and have it done but it would not come out right she would not be able to figure out what went wrong.  She describes her memory difficulties as having difficulty remembering or retrieving information and she will know something has happened but cannot retrieve the details.  She has  been asking others to write information down so she can read it and having a hard copy or reminders as needed.  She reports that she forgets what time to take her medicines and has a very difficult time keeping up with these aspects versus past.  Patient describes other difficulties including problem solving and executive function issues but not sure how much her memory difficulties impact this.  The patient reports that she has trouble talking to people on the phone or in person and does not want to have to explain what her difficulties are and tends to avoid being around others.  She reports that she spends about 90% of her day isolated.  The patient reports that sometimes her depression is very significant and she feels like she is getting into "a dark space."  The patient reports that she has times where she feels overwhelmed thinking that she is never going to "feel normal again."  She reports that everything feels different than in the past.  The patient reports that the experiences around COVID have lingered psychologically.  The patient reports that when she got COVID she was very sick and had to be isolated for 14 days.  She never developed COVID-pneumonia though.  The patient reports that being  isolated for this extended period of time really "did something to me."  She reports that she struggled being away from others.  The patient reports that her mother passed away shortly before the patient became sick and when she was isolated she became overwhelmed with this darkness and loss of her mother.  The patient reports that she is struggled with feeling isolated and alone and unable to function properly.  The patient reports that she sleeps about 5 or 6 hours each night.  She was assessed for sleep apnea with Dr. Vickey Huger and was not found to have sleep apnea.  The patient reports that she tends to sleep in a recliner chair semiupright at night.  The patient reports that there is a change in her  appetite and she mostly just eats at dinnertime.  The patient reports that the taste of food is different and that her smell has never fully returned.  She reports that she has to rely more on her children to help her out and that she has not been able to work anymore.  Current psychotropic medications include Wellbutrin and Cymbalta and that they are trying Cymbalta and now addition of gabapentin for her depression as well as her lower leg pain.  The patient has had CT head performed in 2012 due to left-sided headache and facial pain.  The CT found no evidence of acute intercranial abnormality but this was back in 2012.   Summary of Results:   Overall, the results of the objective assessment do provide a picture of global cognitive decline.  The patient has mild deficits with regard to primary attentional variables with significant lapses of attention, slowed information processing speed as more information or more dynamic processing is required, and significant difficulty with shifting and adjusting to changing aspects of attentional focus.  The patient is showing difficulties in cognitive functioning from a global standpoint with reductions in global cognitive functioning from estimates of premorbid functioning.  Auditory and visual memory are both impaired relative to predicted levels of premorbid functioning with mild to moderate deficits noted for her capacity to store and organize new information.  Her immediate recall is more impaired than her auditory or visual encoding performances would predict.  While the limited information that the patient does initially recall is retained over a period of delay the amount she initially stores and organizes is below predicted levels and would cause a significant impact on day-to-day functioning.  Recognition and retrieval types of challenges did not improve memory/recall highlighting the likelihood of difficulties with initially storing and organizing  information.  Impression/Diagnosis:   Overall, the results of the current neuropsychological evaluation do present with a fairly complicated picture.  The patient describes a sudden change in performance around the time she developed COVID-19 infection although the patient was never hospitalized or experience severe pulmonary distress or COVID-19 pneumonia.  She was isolated for an extended period of time.  The patient has continued with loss of her sense of smell without significant recovery over time.  The patient has significant PTSD and depressive symptoms that have included passive suicidal ideation in the past and continued nightmares and traumatic responses to situations particularly medically associated situations.  The patient continues with nightmares and flashbacks/avoidant types of symptoms.  Even with these emotional features the patient continues to report significant changes in overall cognition and has been unable to return to work.  She has displayed primary attentional deficits related to her capacity to shift attention, confusion, and difficulty  inhibiting impulsive responses and then becoming tangled up by a vague awareness of mistakes.  From an attentional standpoint the patient essentially became her worst enemy where errors produced vigilance over potential of new errors thus magnifying difficulties as the test or challenge would proceed.  The patient would be apprehensive at the beginning and then become entangled as a challenge would proceed.  This slowed information processing speed.  There is a global reduction in overall cognitive functioning with greatest deficits having to do with storage and organization of new information.  The patient was able to encode auditory and visual information in a generally adequate manner but unable to store and organize information even over a very brief duration.  What limited information is initially stored does remain stable over time but repeated  exposure of information does not particularly improve her performance.  Recognition and cueing does not particularly help bring back information.  From a diagnostic perspective the patient does appear to have a significant impact on cognition and giving her previous work history the current level of cognitive performances would be inconsistent with that type of work history.  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  The patient's current attentional weaknesses and memory weaknesses would render such type of work extremely difficult.  The patient's reports of current functioning would be consistent with the level of cognitive deficits displayed on the current assessment.  However, ongoing emotional disturbance, PTSD, anxiety and depression and sleep disturbance are likely playing a contributory role in her cognitive difficulties.  At this point there continues to be a need for further scientific study of long COVID type pathology in general as far as identifying a causative factor.  I suspect that the patient's current significant cognitive weaknesses are due to a multitude of factors including significant emotional distress, PTSD, anxiety and depression as well as impacts from her COVID-19 infection that have not recovered in a typical manner.  Severe depression and PTSD are quite problematic and are aspects that we should be addressing as they are clearly playing a role to some degree.  I will sit down with the patient go over the results of the current neuropsychological evaluation and will address specific recommendations around adapting to and adjusting to the types of cognitive weaknesses that are likely having the greatest impact on her day-to-day functioning.  The patient is being followed by psychiatry and also participating in therapeutic interventions  around her PTSD symptoms.  This should continue.  Neurology was consulted and the patient does not have symptoms consistent with sleep apnea etc.  Diagnosis:    Memory loss  COVID-19 long hauler manifesting chronic fatigue  MDD (major depressive disorder), recurrent episode, mild (HCC)  Chronic posttraumatic stress disorder   _____________________ Arley Phenix, Psy.D. Clinical Neuropsychologist

## 2023-06-24 ENCOUNTER — Encounter: Payer: Self-pay | Admitting: Physician Assistant

## 2023-06-24 ENCOUNTER — Ambulatory Visit: Payer: Medicare Other | Admitting: Physician Assistant

## 2023-06-24 VITALS — BP 137/73 | HR 80

## 2023-06-24 DIAGNOSIS — Z9889 Other specified postprocedural states: Secondary | ICD-10-CM

## 2023-06-24 NOTE — Progress Notes (Signed)
Patient is a pleasant 43 year old female with history of bilateral breast reduction 04/13/2023 complicated by left breast abscess requiring OR washout 05/03/2023 by Dr. Ladona Ridgel who presents to clinic for postoperative follow-up.   She was last seen here in clinic on 06/10/2023.  At that time, reported diminished drainage from the small pinpoint wound on most medial aspect of left breast inframammary incision.  However, continue endorse a "sharp" discomfort, sometimes in both breasts but mainly left medial breast.  Exam was benign.  Mild scar hypertrophy at left inferior T zone.  Otherwise entirely reassuring.  Did not extend any antibiotics.  Recommended continued Vashe and mechanical massage of the areas of hypertrophic scarring.  Today, patient is doing a bit better.  She still continues to endorse some "soreness" that is tender to palpation in the superior medial aspects of her breasts bilaterally.  She also states the left lateral aspect where she had the abscess is also persistently sore, albeit improving.  She denies any systemic symptoms or other concerns.  She is accompanied by her daughter at bedside.    On exam, breasts of excellent shape and symmetry.  NAC's are healthy and viable, well-healed.  Mild TTP over medial aspects of breast bilaterally, but no palpable underlying fluid collections or areas of firmness appreciated.  Her incisions are well-healed throughout.  Mildly worsened scarring on inframammary incision left breast where she had to be reopened and reclosed.  No significant hypertrophy, softened slightly since last encounter.  Her exam today is benign.  No evidence concerning for infection.  Suspect that her soreness is related to having to go back to surgery and because the left lateral/axillary region is typically more sensitive for other patients as well.  No obvious fat necrosis or underlying fluid collections.  No cellulitis on exam.  She is overall doing quite nicely.  Recommend  continued mechanical massage of her incisions and any areas of firmness.  Recommending silicone scar gel twice daily x 3 months.  Follow-up with Dr. Ladona Ridgel in 6 months, but she can certainly call the clinic and return should she have any questions or concerns in interim.  Picture(s) obtained of the patient and placed in the chart were with the patient's or guardian's permission.

## 2023-06-25 ENCOUNTER — Encounter: Payer: Self-pay | Admitting: Physician Assistant

## 2023-06-25 ENCOUNTER — Ambulatory Visit (INDEPENDENT_AMBULATORY_CARE_PROVIDER_SITE_OTHER): Payer: Medicare Other | Admitting: Physician Assistant

## 2023-06-25 ENCOUNTER — Encounter: Payer: Medicare Other | Admitting: Physical Medicine and Rehabilitation

## 2023-06-25 VITALS — BP 130/69 | HR 79 | Temp 98.1°F | Ht 65.0 in | Wt 232.6 lb

## 2023-06-25 DIAGNOSIS — K6289 Other specified diseases of anus and rectum: Secondary | ICD-10-CM | POA: Diagnosis not present

## 2023-06-25 DIAGNOSIS — K5909 Other constipation: Secondary | ICD-10-CM

## 2023-06-25 DIAGNOSIS — K625 Hemorrhage of anus and rectum: Secondary | ICD-10-CM

## 2023-06-25 DIAGNOSIS — Z800 Family history of malignant neoplasm of digestive organs: Secondary | ICD-10-CM | POA: Diagnosis not present

## 2023-06-25 DIAGNOSIS — K581 Irritable bowel syndrome with constipation: Secondary | ICD-10-CM

## 2023-06-25 DIAGNOSIS — K5904 Chronic idiopathic constipation: Secondary | ICD-10-CM

## 2023-06-25 MED ORDER — PEG 3350-KCL-NA BICARB-NACL 420 G PO SOLR
4000.0000 mL | Freq: Once | ORAL | 0 refills | Status: AC
Start: 2023-06-25 — End: 2023-06-25

## 2023-06-25 NOTE — Progress Notes (Signed)
Celso Amy, PA-C [address]  [address]  [place], [place] [address]  Main: [phone]  Fax: [phone]   Primary Care Physician: [name], MD  Primary Gastroenterologist:  Celso Amy, PA-C / Dr. Wyline Mood    CC: Rectal Pain, Rectal Bleeding, Constipation  HPI: Catherine Berg is a 43 y.o. female presents for rectal pain, rectal bleeding, and constipation.  Symptoms started after she had breast reduction surgery and hospitalization for infection December 2024.  She developed worsening constipation.  Has been on Linzess 72 and 145 in the past, but not consistently.  She states those treatments stopped working.  In the past 2 weeks she has noticed rectal pain with bowel movements.  She is noticing bright red blood on the tissue and in her stool..  Also having some mucus in her stools.  She is having bowel movement daily.  Takes OTC Dulcolax as needed but not every day.  She has constant burning rectal pain with every bowel movement.  She has family history of paternal grandfather with colon cancer.  She is requesting to have updated colonoscopy.  Last Colonoscopy 11/2018 -good prep, no polyps, mild thickened fold in the cecum, normal biopsies.  Current Outpatient Medications  Medication Sig Dispense Refill   amLODipine (NORVASC) 5 MG tablet Take 1 tablet (5 mg total) by mouth daily. 90 tablet 1   bisacodyl (DULCOLAX) 5 MG EC tablet Take 1 tablet (5 mg total) by mouth daily as needed for moderate constipation. 30 tablet 1   busPIRone (BUSPAR) 5 MG tablet Take 1 tablet (5 mg total) by mouth 2 (two) times daily. 180 tablet 0   DULoxetine (CYMBALTA) 60 MG capsule Take 1 capsule (60 mg total) by mouth daily. 90 capsule 0   hydrOXYzine (ATARAX) 10 MG tablet Take 1 tablet (10 mg total) by mouth daily as needed for anxiety. (Patient taking differently: Take 20 mg by mouth daily as needed for anxiety.) 30 tablet 3   levothyroxine (SYNTHROID) 100 MCG tablet Take 100 mcg by  mouth daily before breakfast.     linaclotide (LINZESS) 72 MCG capsule Take 1 capsule (72 mcg total) by mouth daily before breakfast. 30 capsule 0   metFORMIN (GLUCOPHAGE) 500 MG tablet Take 1 tablet (500 mg total) by mouth daily with breakfast. (Patient taking differently: Take 500 mg by mouth daily with breakfast. Takes for long covid neuropathy) 90 tablet 3   mometasone-formoterol (DULERA) 200-5 MCG/ACT AERO Inhale 2 puffs into the lungs in the morning and at bedtime. 1 each 11   montelukast (SINGULAIR) 10 MG tablet Take 1 tablet (10 mg total) by mouth at bedtime. 90 tablet 1   omeprazole (PRILOSEC) 20 MG capsule Take 1 capsule (20 mg total) by mouth 2 (two) times daily before a meal. 90 capsule 3   ondansetron (ZOFRAN) 4 MG tablet Take 1 tablet (4 mg total) by mouth every 6 (six) hours as needed for nausea. 30 tablet 0   polyethylene glycol-electrolytes (NULYTELY) 420 g solution Take 4,000 mLs by mouth once for 1 dose. Use as directed for your colonoscopy preparation 4000 mL 0   topiramate (TOPAMAX) 100 MG tablet Take 1 tablet (100 mg total) by mouth at bedtime. 90 tablet 3   [START ON 06/26/2023] traZODone (DESYREL) 50 MG tablet Take 1 tablet (50 mg total) by mouth at bedtime as needed for sleep. 30 tablet 3   VENTOLIN HFA 108 (90 Base) MCG/ACT inhaler INHALE 2 PUFFS BY MOUTH EVERY 6 HOURS AS NEEDED  FOR WHEEZING OR SHORTNESS OF BREATH 18 g 1   Vitamin D, Ergocalciferol, (DRISDOL) 1.25 MG (50000 UNIT) CAPS capsule Take 1 capsule (50,000 Units total) by mouth every 7 (seven) days. 12 capsule 1   ferrous sulfate 325 (65 FE) MG EC tablet Take 1 tablet (325 mg total) by mouth 2 (two) times daily for 10 days. 20 tablet 0   No current facility-administered medications for this visit.    Allergies as of 06/25/2023 - Review Complete 06/25/2023  Allergen Reaction Noted   Latex Hives 07/30/2014   Shellfish allergy Hives 07/30/2014    Past Medical History:  Diagnosis Date   Abnormal thyroid blood  test    Allergy    Anemia    Anxiety    Asthma    Complication of anesthesia    itching after c sections   COVID-19 01/2020   Depression    Elevated serum glutamic pyruvic transaminase (SGPT) level    Graves disease    History of abnormal cervical Pap smear    History of cervical polypectomy    Hives 09/02/2015   Hypertension    Hypothyroidism    IFG (impaired fasting glucose)    Incidental lung nodule, > 3mm and < 8mm 03/31/2017   4 mm RLL lung nodule on chest CT Mar 31, 2017   Low serum vitamin D    Neuromuscular disorder (HCC)    Obesity    PONV (postoperative nausea and vomiting)    after c section had vomit   Pregnancy induced hypertension    with 1st pregnancy, normal pressure with 2nd   Reflux    Sprain of right great toe 01/21/2023   Thyroid disease    Vitamin B12 deficiency    Vitamin D deficiency disease    Wears dentures    full upper    Past Surgical History:  Procedure Laterality Date   BREAST REDUCTION SURGERY Bilateral 04/13/2023   Procedure: MAMMARY REDUCTION  (BREAST);  Surgeon: [name], MD;  Location: [facility];  Service: Plastics;  Laterality: Bilateral;   BREAST SURGERY     CERVICAL POLYPECTOMY     CESAREAN SECTION     x 2   CHOLECYSTECTOMY     COLONOSCOPY WITH PROPOFOL N/A 11/14/2018   Procedure: COLONOSCOPY WITH PROPOFOL;  Surgeon: Pasty Spillers, MD;  Location[facility];  Service: Endoscopy;  Laterality: N/A;   ESOPHAGOGASTRODUODENOSCOPY (EGD) WITH PROPOFOL N/A 11/14/2018   Procedure: ESOPHAGOGASTRODUODENOSCOPY (EGD) WITH PROPOFOL;  Surgeon: Pasty Spillers, MD;  Location: [facility];  Service: Endoscopy;  Laterality: N/A;  Latex allergy   GIVENS CAPSULE STUDY N/A 12/27/2018   Procedure: GIVENS CAPSULE STUDY;  Surgeon: Pasty Spillers, MD;  Location: [facility];  Service: Endoscopy;  Laterality: N/A;   INCISION AND DRAINAGE OF WOUND Left 05/03/2023   Procedure: IRRIGATION AND  DEBRIDEMENT WOUND;  Surgeon: [name], MD;  Location: [facility];  Service: Plastics;  Laterality: Left;    Review of Systems:    All systems reviewed and negative except where noted in HPI.   Physical Examination:   BP 130/69   Pulse 79   Temp 98.1 F (36.7 C)   Ht 5\' 5"  (1.651 m)   Wt 232 lb 9.6 oz (105.5 kg)   BMI 38.71 kg/m   General: Well-nourished, well-developed in no acute distress.  Lungs: Clear to auscultation bilaterally. Non-labored. Heart: Regular rate and rhythm, no murmurs rubs or gallops.  Abdomen: Bowel sounds are normal; Abdomen  is Soft; No hepatosplenomegaly, masses or hernias;  No Abdominal Tenderness; No guarding or rebound tenderness. Rectal:  No external hemorrhoids, abscesses, or masses.  Mild to moderate tenderness just inside the anus - suspect fissure.  No gross blood. Neuro: Alert and oriented x 3.  Grossly intact.  Psych: Alert and cooperative, normal mood and affect.   Imaging Studies: No results found.  Assessment and Plan:   Catherine Berg is a 43 y.o. y/o female presents for:  1.  Chronic constipation  Linzess 72 and 145 doses stopped working.  Trial of Linzess - Sample given - 2 boxes.  Call back for Rx if sample works.  Add stool softener as needed.  2.  Rectal pain, suspect anal fissure  Rx nifedipine/lidocaine cream, apply twice daily to affected areas as needed.  Sent to [employer] [facility].  Warm water sitz bath's.  Treat underlying constipation with Linzess and stool softeners.  3.  Rectal bleeding, suspect due to anal fissure  Scheduling updated colonoscopy  4.  Family history of colon cancer in second-degree grandfather  Scheduling updated colonoscopy per patient request   Celso Amy, PA-C  Follow up 2 months with MD.

## 2023-06-25 NOTE — Patient Instructions (Addendum)
I am giving samples of Linzess 290 mcg 1 capsule once daily.  If it works well, please call me for sending message and I will send in a prescription.  If Linzess 290 mcg is too strong, then we can prescribe Linzess dose.  Just let me know.  Recommend warm water sitz bath's to help with rectal pain.  You can add OTC Colace stool softener if needed to prevent hard stools.   [facility] [address], [place] [address]  Phone number [phone]  Please allow at least 24 hours before picking up the compounded cream because the pharmacy has to make the medication.

## 2023-06-30 ENCOUNTER — Ambulatory Visit (INDEPENDENT_AMBULATORY_CARE_PROVIDER_SITE_OTHER): Payer: No Typology Code available for payment source | Admitting: Licensed Clinical Social Worker

## 2023-06-30 DIAGNOSIS — F330 Major depressive disorder, recurrent, mild: Secondary | ICD-10-CM

## 2023-06-30 DIAGNOSIS — F411 Generalized anxiety disorder: Secondary | ICD-10-CM | POA: Diagnosis not present

## 2023-06-30 DIAGNOSIS — F4310 Post-traumatic stress disorder, unspecified: Secondary | ICD-10-CM
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
## 2023-07-12 ENCOUNTER — Encounter
Payer: Medicare Other | Attending: Physical Medicine and Rehabilitation | Admitting: Physical Medicine and Rehabilitation

## 2023-07-12 ENCOUNTER — Encounter: Payer: Self-pay | Admitting: Physical Medicine and Rehabilitation

## 2023-07-12 VITALS — BP 132/3 | HR 84 | Ht 65.0 in | Wt 234.0 lb

## 2023-07-12 DIAGNOSIS — G629 Polyneuropathy, unspecified: Secondary | ICD-10-CM | POA: Diagnosis not present

## 2023-07-12 MED ORDER — CAPSAICIN-CLEANSING GEL 8 % EX KIT
4.0000 | PACK | Freq: Once | CUTANEOUS | Status: AC
  Administered 2023-07-12: 4 via TOPICAL

## 2023-07-12 NOTE — Progress Notes (Signed)
-  Discussed Qutenza as an option for neuropathic pain control. Discussed that this is a capsaicin patch, stronger than capsaicin cream. Discussed that it is currently approved for diabetic peripheral neuropathy and post-herpetic neuralgia, but that it has also shown benefit in treating other forms of neuropathy. Provided patient with link to site to learn more about the patch: https://www.clark.biz/. Discussed that the patch would be placed in office and benefits usually last 3 months. Discussed that unintended exposure to capsaicin can cause severe irritation of eyes, mucous membranes, respiratory tract, and skin, but that Qutenza is a local treatment and does not have the systemic side effects of other nerve medications. Discussed that there may be pain, itching, erythema, and decreased sensory function associated with the application of Qutenza. Side effects usually subside within 1 week. A cold pack of analgesic medications can help with these side effects. Blood pressure can also be increased due to pain associated with administration of the patch.   4 patches of Qutenza [id number]) was applied to the area of pain. Ice packs were applied during the procedure to ensure patient comfort. Blood pressure was monitored every 15 minutes. The patient tolerated the procedure well. Post-procedure instructions were given and follow-up has been scheduled.  Topical system measures 14cm x20cm (280cm for a total 1120units) were applied which will cause deeper penetration for destruction of the peripheral nerve using a chemical (Qutenza) which infuses into the skin like an injection and heat technique (occlusive, compressive dressing cauing endothermic heat technique)

## 2023-07-12 NOTE — Patient Instructions (Addendum)
 D3/K2 1,000U daily Nattokinase or eating natto, [employer]  Avoid gluten, dairy, eggs, processed foods  Foods that may reduce pain: 1) Ginger (especially studied for arthritis)- reduce leukotriene production to decrease inflammation 2) Blueberries- high in phytonutrients that decrease inflammation 3) Salmon- marine omega-3s reduce joint swelling and pain 4) Pumpkin seeds- reduce inflammation 5) dark chocolate- reduces inflammation 6) turmeric- reduces inflammation 7) tart cherries - reduce pain and stiffness 8) extra virgin olive oil - its compound olecanthal helps to block prostaglandins  9) chili peppers- can be eaten or applied topically via capsaicin 10) mint- helpful for headache, muscle aches, joint pain, and itching 11) garlic- reduces inflammation  Link to further information on diet for chronic pain: http://www.bray.com/   Eat wild blueberries!!

## 2023-07-14 ENCOUNTER — Ambulatory Visit (INDEPENDENT_AMBULATORY_CARE_PROVIDER_SITE_OTHER): Payer: Medicaid Other | Admitting: Family Medicine

## 2023-07-14 ENCOUNTER — Telehealth: Payer: Self-pay | Admitting: Family Medicine

## 2023-07-14 ENCOUNTER — Encounter: Payer: Self-pay | Admitting: Family Medicine

## 2023-07-14 VITALS — BP 124/82 | HR 85 | Temp 98.1°F | Resp 16 | Ht 65.0 in | Wt 234.0 lb

## 2023-07-14 DIAGNOSIS — I10 Essential (primary) hypertension: Secondary | ICD-10-CM

## 2023-07-14 DIAGNOSIS — K219 Gastro-esophageal reflux disease without esophagitis: Secondary | ICD-10-CM

## 2023-07-14 DIAGNOSIS — K581 Irritable bowel syndrome with constipation: Secondary | ICD-10-CM

## 2023-07-14 DIAGNOSIS — J4520 Mild intermittent asthma, uncomplicated: Secondary | ICD-10-CM | POA: Diagnosis not present

## 2023-07-14 DIAGNOSIS — J3089 Other allergic rhinitis: Secondary | ICD-10-CM | POA: Diagnosis not present

## 2023-07-14 DIAGNOSIS — E559 Vitamin D deficiency, unspecified: Secondary | ICD-10-CM

## 2023-07-14 DIAGNOSIS — E039 Hypothyroidism, unspecified: Secondary | ICD-10-CM

## 2023-07-14 DIAGNOSIS — J302 Other seasonal allergic rhinitis: Secondary | ICD-10-CM

## 2023-07-14 DIAGNOSIS — F331 Major depressive disorder, recurrent, moderate: Secondary | ICD-10-CM

## 2023-07-14 DIAGNOSIS — L732 Hidradenitis suppurativa: Secondary | ICD-10-CM

## 2023-07-14 MED ORDER — AMLODIPINE BESYLATE 2.5 MG PO TABS: 2.5000 mg | ORAL_TABLET | Freq: Every day | ORAL | 0 refills | Status: DC

## 2023-07-14 MED ORDER — MONTELUKAST SODIUM 10 MG PO TABS: 10.0000 mg | ORAL_TABLET | Freq: Every day | ORAL | 1 refills | Status: DC

## 2023-07-14 MED ORDER — HYDROCHLOROTHIAZIDE 12.5 MG PO TABS: 12.5000 mg | ORAL_TABLET | Freq: Every day | ORAL | 0 refills | Status: DC

## 2023-07-14 MED ORDER — WEGOVY 0.25 MG/0.5ML ~~LOC~~ SOAJ: 0.2500 mg | SUBCUTANEOUS | 0 refills | Status: DC

## 2023-07-14 MED ORDER — AMLODIPINE BESYLATE 5 MG PO TABS: 5.0000 mg | ORAL_TABLET | Freq: Every day | ORAL | 1 refills | Status: DC

## 2023-07-14 NOTE — Telephone Encounter (Signed)
 PA done faxing Office visit note along with PA Form will wait on insurance determination.

## 2023-07-14 NOTE — Telephone Encounter (Signed)
 Unable to do through cover my meds, printed [place] TRACK FORM to do PA

## 2023-07-14 NOTE — Progress Notes (Signed)
 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 She had  depression prior to COVID but got worse after COVID and was having PTSD and worsening of her depression Since had a gap in insurance but now has Medicaid.She has seen multiple doctors: Diplomatic Services operational officer, Rheumatologist, Psychiatrist, Neurologist, PT, OT  She sees her Psychiatrist Dr. Vanetta Shawl and takes medications as prescribed. She has been diagnosed with peripheral neuropathy and is getting Qutenza therapy every 3 months and it has helped with symptoms . She also takes Topiramate for the same condition    IBS: she is now taking Bentyl given by GI. She states Linzess seems to only work when she takes medication , she states even on higher dose does not feel like she is emptying her bowels completely    Post-ablation hypothyroidism: recently seen by Dr. Gershon Crane and is now on 88 mcg of levothyroxine daily, no change in bowel movement , she has mild dry skin .    Asthma Moderate persistent  under the care of Dr. Belia Heman, she is on Dulera and montelukast , she denies cough or wheezing but has noticed sob with fast pace walking     Perennial AR: she states symptoms are well controlled, not using nasal spray, but taking singulair and  prn anti-histamines . I will send Singulair    B12 deficiency: she has been taking SL B12    Morbid Obesity: weight is down 2 lbs since last visit,   BMI is above 35 wotj co-morbidities such as GERD and HTN. . She now has Medicaid and we will try to get [facility] rx . She is currently taking Metformin   Hydradenitis suppurativa: recent flares but stable over time. On axilla  S/p breast reduction: had infection, but doing well now, swelling went down   GERD: taking PPI and doing well at this time     Patient Active Problem List   Diagnosis Date Noted   Acute postoperative anemia due to expected blood loss 05/09/2023   Hypokalemia 05/08/2023   Hyponatremia 05/04/2023   Cellulitis of left breast 05/02/2023   Post op infection of left breast 04/29/2023   Essential hypertension 04/29/2023   Acquired hypothyroidism 04/29/2023   Depression with anxiety 04/29/2023   Asthma 05/12/2022   MDD (major depressive disorder), recurrent episode, mild (HCC) 11/17/2021   Neuropathy involving both lower extremities 11/17/2021   Perennial allergic rhinitis with seasonal variation 11/17/2021   Chronic pain of both lower extremities 09/24/2020   Sleeps in sitting position due to orthopnea 07/02/2020   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   Vitamin D deficiency 04/22/2020   COVID-19 long hauler manifesting chronic fatigue 04/22/2020   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   Memory loss 02/28/2020  Physical deconditioning 02/28/2020   Dyspnea on exertion 02/28/2020   Neck pain, bilateral 02/28/2020   Muscle spasms of neck 02/28/2020   Arthralgia of hand 01/24/2019   Class 3 severe obesity due to excess calories without serious comorbidity in adult [facility]) 01/24/2019   Palpitations 09/16/2018   Coronary artery calcification 09/16/2018   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   Bilateral leg edema 04/20/2017   Incidental lung nodule, > 3mm and < 8mm 03/31/2017   Opioid-induced constipation 11/07/2015    Hives 09/02/2015   Obesity, Class III, BMI 40-49.9 (morbid obesity) (HCC) 08/15/2015   Anxiety 07/09/2015   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 06/28/2015   Thyroid nodule 02/01/2015   Vitamin B12 deficiency    History of abnormal cervical Pap smear    Allergy-induced asthma, mild intermittent, uncomplicated 10/09/2013    Past Surgical History:  Procedure Laterality Date   BREAST REDUCTION SURGERY Bilateral 04/13/2023   Procedure: MAMMARY REDUCTION  (BREAST);  Surgeon: [name], MD;  Location: [facility];  Service: Plastics;  Laterality: Bilateral;   BREAST SURGERY     CERVICAL POLYPECTOMY     CESAREAN SECTION     x 2   CHOLECYSTECTOMY     COLONOSCOPY WITH PROPOFOL N/A 11/14/2018   Procedure: COLONOSCOPY WITH PROPOFOL;  Surgeon: Pasty Spillers, MD;  Location: [facility];  Service: Endoscopy;  Laterality: N/A;   ESOPHAGOGASTRODUODENOSCOPY (EGD) WITH PROPOFOL N/A 11/14/2018   Procedure: ESOPHAGOGASTRODUODENOSCOPY (EGD) WITH PROPOFOL;  Surgeon: Pasty Spillers, MD;  Location[facility];  Service: Endoscopy;  Laterality: N/A;  Latex allergy   GIVENS CAPSULE STUDY N/A 12/27/2018   Procedure: GIVENS CAPSULE STUDY;  Surgeon: Pasty Spillers, MD;  Location: [facility];  Service: Endoscopy;  Laterality: N/A;   INCISION AND DRAINAGE OF WOUND Left 05/03/2023   Procedure: IRRIGATION AND DEBRIDEMENT WOUND;  Surgeon: [name], MD;  Location: [facility] OR;  Service: Plastics;  Laterality: Left;    Family History  Problem Relation Age of Onset   Heart attack Mother 46   Hypertension Mother    Asthma Mother    Cancer Mother        laryngeal   Diabetes Mother        pre diabetic   Heart disease Mother    Mental illness Mother    Alcohol abuse Mother    Drug abuse Mother    Depression Mother    Anxiety disorder Mother    Bipolar disorder Mother    Learning disabilities Brother    ADD / ADHD Brother    Asthma Son    Hyperlipidemia  Brother    Alcohol abuse Father    Heart disease Maternal Grandmother        heart attack, pacemaker   Heart attack Maternal Grandmother    Hypertension Maternal Grandmother    Hypertension Maternal Grandfather    Heart disease Paternal Grandmother        couple of major open heart surgeries, leaking valves   Hypertension Paternal Grandmother    Hypertension Paternal Grandfather    Breast cancer Neg Hx     Social History   Tobacco Use   Smoking status: Never   Smokeless tobacco: Never  Substance Use Topics   Alcohol use: No     Current Outpatient Medications:    amLODipine (NORVASC) 5 MG tablet, Take 1 tablet (5 mg total) by mouth daily., Disp: 90 tablet, Rfl: 1   bisacodyl (DULCOLAX) 5 MG EC tablet, Take 1 tablet (5 mg  total) by mouth daily as needed for moderate constipation., Disp: 30 tablet, Rfl: 1   busPIRone (BUSPAR) 5 MG tablet, Take 1 tablet (5 mg total) by mouth 2 (two) times daily., Disp: 180 tablet, Rfl: 0   DULoxetine (CYMBALTA) 60 MG capsule, Take 1 capsule (60 mg total) by mouth daily., Disp: 90 capsule, Rfl: 0   levothyroxine (SYNTHROID) 100 MCG tablet, Take 100 mcg by mouth daily before breakfast., Disp: , Rfl:    linaclotide (LINZESS) 72 MCG capsule, Take 1 capsule (72 mcg total) by mouth daily before breakfast., Disp: 30 capsule, Rfl: 0   metFORMIN (GLUCOPHAGE) 500 MG tablet, Take 1 tablet (500 mg total) by mouth daily with breakfast. (Patient taking differently: Take 500 mg by mouth daily with breakfast. Takes for long covid neuropathy), Disp: 90 tablet, Rfl: 3   mometasone-formoterol (DULERA) 200-5 MCG/ACT AERO, Inhale 2 puffs into the lungs in the morning and at bedtime., Disp: 1 each, Rfl: 11   montelukast (SINGULAIR) 10 MG tablet, Take 1 tablet (10 mg total) by mouth at bedtime., Disp: 90 tablet, Rfl: 1   omeprazole (PRILOSEC) 20 MG capsule, Take 1 capsule (20 mg total) by mouth 2 (two) times daily before a meal., Disp: 90 capsule, Rfl: 3   topiramate  (TOPAMAX) 100 MG tablet, Take 1 tablet (100 mg total) by mouth at bedtime., Disp: 90 tablet, Rfl: 3   traZODone (DESYREL) 50 MG tablet, Take 1 tablet (50 mg total) by mouth at bedtime as needed for sleep., Disp: 30 tablet, Rfl: 3   VENTOLIN HFA 108 (90 Base) MCG/ACT inhaler, INHALE 2 PUFFS BY MOUTH EVERY 6 HOURS AS NEEDED FOR WHEEZING OR SHORTNESS OF BREATH, Disp: 18 g, Rfl: 1   Vitamin D, Ergocalciferol, (DRISDOL) 1.25 MG (50000 UNIT) CAPS capsule, Take 1 capsule (50,000 Units total) by mouth every 7 (seven) days., Disp: 12 capsule, Rfl: 1   ferrous sulfate 325 (65 FE) MG EC tablet, Take 1 tablet (325 mg total) by mouth 2 (two) times daily for 10 days., Disp: 20 tablet, Rfl: 0   GAVILYTE-N WITH FLAVOR PACK 420 g solution, TAKE AS DIRECTED FOR COLONOSCOPY PREPARATION (Patient not taking: Reported on 07/14/2023), Disp: , Rfl:   Allergies  Allergen Reactions   Latex Hives    (Balloons, condoms, underwear elastic, gloves)   Shellfish Allergy Hives    I personally reviewed active problem list, medication list, allergies, family history with the patient/caregiver today.   ROS  Ten systems reviewed and is negative except as mentioned in HPI    Objective  Vitals:   07/14/23 0907  BP: 124/82  Pulse: 85  Resp: 16  Temp: 98.1 F (36.7 C)  TempSrc: Oral  SpO2: 97%  Weight: 234 lb (106.1 kg)  Height: 5\' 5"  (1.651 m)    Body mass index is 38.94 kg/m.  Physical Exam  Constitutional: Patient appears well-developed and well-nourished. Obese  No distress.  HEENT: head atraumatic, normocephalic, pupils equal and reactive to light, neck supple Cardiovascular: Normal rate, regular rhythm and normal heart sounds.  No murmur heard. No BLE edema. Pulmonary/Chest: Effort normal and breath sounds normal. No respiratory distress. Abdominal: Soft.  There is no tenderness. Psychiatric: Patient has a normal mood and affect. behavior is normal. Judgment and thought content normal.    Diabetic Foot  Exam:     PHQ2/9:    07/14/2023    9:04 AM 07/12/2023   10:40 AM 05/27/2023    4:18 PM 05/21/2023    1:07 PM 03/17/2023  1:12 PM  Depression screen PHQ 2/9  Decreased Interest 1 1  2    Down, Depressed, Hopeless 1 1  2    PHQ - 2 Score 2 2  4    Altered sleeping 1   2   Tired, decreased energy 1   2   Change in appetite 1   2   Feeling bad or failure about yourself  0   1   Trouble concentrating 1   0   Moving slowly or fidgety/restless 0   0   Suicidal thoughts 0   0   PHQ-9 Score 6   11   Difficult doing work/chores Very difficult   Somewhat difficult      Information is confidential and restricted. Go to Review Flowsheets to unlock data.    phq 9 is positive  Fall Risk:    07/12/2023   10:40 AM 05/21/2023    1:07 PM 04/29/2023    9:46 AM 02/25/2023    3:03 PM 12/24/2022   10:10 AM  Fall Risk   Falls in the past year? 0 0 0 1 0  Number falls in past yr: 0 0  0 0  Injury with Fall? 0 0  0 0  Risk for fall due to :  No Fall Risks No Fall Risks History of fall(s)   Follow up  Falls prevention discussed;Education provided;Falls evaluation completed Falls prevention discussed Falls evaluation completed      Assessment & Plan  1. Mild intermittent asthma without complication (Primary)  - montelukast (SINGULAIR) 10 MG tablet; Take 1 tablet (10 mg total) by mouth at bedtime.  Dispense: 90 tablet; Refill: 1  2. Moderate recurrent major depression (HCC)  Under care of psychiatrist   3. Morbid obesity (HCC)  - Semaglutide-Weight Management (WEGOVY) 0.25 MG/0.5ML SOAJ; Inject 0.25 mg into the skin once a week.  Dispense: 2 mL; Refill: 0  4. Perennial allergic rhinitis with seasonal variation  - montelukast (SINGULAIR) 10 MG tablet; Take 1 tablet (10 mg total) by mouth at bedtime.  Dispense: 90 tablet; Refill: 1  5. Primary hypertension  - amLODipine (NORVASC) 2.5 MG tablet; Take 1 tablet (2.5 mg total) by mouth daily.  Dispense: 90 tablet; Refill: 0 -  hydrochlorothiazide (HYDRODIURIL) 12.5 MG tablet; Take 1 tablet (12.5 mg total) by mouth daily.  Dispense: 90 tablet; Refill: 0  6. Vitamin D deficiency disease  Continue supplementation   7. Gastroesophageal reflux disease without esophagitis  Controlled with PPI  8. Irritable bowel syndrome with constipation  Keep visit with GI  9. Adult hypothyroidism  Keep visits with Endo   10. Hidradenitis suppurativa

## 2023-07-14 NOTE — Telephone Encounter (Signed)
 Prior Authorization from [employer] (WEGOVY) 0.25 MG/0.5ML SOAJ   Key: [id number]

## 2023-07-19 ENCOUNTER — Telehealth: Payer: Self-pay | Admitting: Family Medicine

## 2023-07-19 NOTE — Telephone Encounter (Signed)
 Spoke to patient asked if she needed a PA done, pt states she was calling for a refill. I advised patient she was given a year supply on 02/25/2023. Pt needs to call pharmacy for remaining refills. Pt verbalized understanding

## 2023-07-19 NOTE — Telephone Encounter (Signed)
 Prior Authorization from [facility]  omeprazole (PRILOSEC) 20 MG capsule

## 2023-07-22 ENCOUNTER — Telehealth: Payer: Self-pay

## 2023-07-22 ENCOUNTER — Ambulatory Visit: Admission: RE | Admit: 2023-07-22 | Payer: Medicare Other | Source: Home / Self Care | Admitting: Gastroenterology

## 2023-07-22 ENCOUNTER — Encounter: Admission: RE | Payer: Self-pay | Source: Home / Self Care

## 2023-07-22 ENCOUNTER — Telehealth: Payer: Self-pay | Admitting: Physician Assistant

## 2023-07-22 SURGERY — COLONOSCOPY WITH PROPOFOL
Anesthesia: General

## 2023-07-22 NOTE — Telephone Encounter (Signed)
 Patient cancelled Colonoscopy for today-   Left message for patient to return call to office so we can reschedule colonoscopy.

## 2023-07-22 NOTE — Telephone Encounter (Signed)
 The patient left a voicemail stating that she called earlier this morning to cancel her scheduled colonoscopy due to not feeling well and expressed a desire to reschedule the procedure. I returned her call to acknowledge receipt of the voicemail and informed her that the message had been forwarded to the nurse. I advised her that if the nurse does not respond, she should expect a callback within 24 to 48 hours.

## 2023-07-23 NOTE — Telephone Encounter (Signed)
 Tried calling patient again to schedule Colonoscopy-also sent message to her mychart.
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
## 2023-07-27 ENCOUNTER — Encounter: Payer: Self-pay | Admitting: Psychiatry

## 2023-07-27 ENCOUNTER — Ambulatory Visit (INDEPENDENT_AMBULATORY_CARE_PROVIDER_SITE_OTHER): Payer: Medicare Other | Admitting: Psychiatry

## 2023-07-27 ENCOUNTER — Encounter: Payer: Self-pay | Admitting: Family Medicine

## 2023-07-27 VITALS — BP 112/76 | HR 90 | Temp 98.8°F | Ht 65.0 in | Wt 230.0 lb

## 2023-07-27 DIAGNOSIS — F411 Generalized anxiety disorder: Secondary | ICD-10-CM | POA: Diagnosis not present

## 2023-07-27 DIAGNOSIS — F330 Major depressive disorder, recurrent, mild: Secondary | ICD-10-CM | POA: Diagnosis not present

## 2023-07-27 DIAGNOSIS — G4700 Insomnia, unspecified: Secondary | ICD-10-CM | POA: Diagnosis not present

## 2023-07-27 DIAGNOSIS — F4310 Post-traumatic stress disorder, unspecified: Secondary | ICD-10-CM

## 2023-07-27 MED ORDER — BUSPIRONE HCL 5 MG PO TABS: 5.0000 mg | ORAL_TABLET | Freq: Two times a day (BID) | ORAL | 0 refills | Status: DC

## 2023-07-27 MED ORDER — TRAZODONE HCL 100 MG PO TABS: 100.0000 mg | ORAL_TABLET | Freq: Every evening | ORAL | 1 refills | Status: DC | PRN

## 2023-07-27 MED ORDER — DULOXETINE HCL 60 MG PO CPEP: 60.0000 mg | ORAL_CAPSULE | Freq: Every day | ORAL | 0 refills | Status: DC

## 2023-07-28 ENCOUNTER — Encounter: Payer: Self-pay | Admitting: Nurse Practitioner

## 2023-07-28 ENCOUNTER — Ambulatory Visit (INDEPENDENT_AMBULATORY_CARE_PROVIDER_SITE_OTHER): Admitting: Nurse Practitioner

## 2023-07-28 ENCOUNTER — Ambulatory Visit

## 2023-07-28 VITALS — BP 126/82 | HR 73 | Resp 18 | Ht 65.0 in | Wt 230.7 lb

## 2023-07-28 DIAGNOSIS — R0781 Pleurodynia: Secondary | ICD-10-CM | POA: Diagnosis not present

## 2023-07-28 MED ORDER — CYCLOBENZAPRINE HCL 5 MG PO TABS: 5.0000 mg | ORAL_TABLET | Freq: Every day | ORAL | 0 refills | Status: DC

## 2023-07-28 MED ORDER — NAPROXEN 500 MG PO TABS: 500.0000 mg | ORAL_TABLET | Freq: Two times a day (BID) | ORAL | 0 refills | Status: AC

## 2023-07-28 NOTE — Progress Notes (Signed)
 BP 126/82   Pulse 73   Resp 18   Ht 5\' 5"  (1.651 m)   Wt 230 lb 11.2 oz (104.6 kg)   SpO2 98%   BMI 38.39 kg/m    Subjective:    Patient ID: Catherine Berg, female    DOB: 06/29/1980, 43 y.o.   MRN: [id number]  HPI: Catherine Berg is a 43 y.o. female  Chief Complaint  Patient presents with   Flank Pain    Pt states sharp pain when takes a breath on right side    Discussed the use of AI scribe software for clinical note transcription with the patient, who gave verbal consent to proceed.  History of Present Illness Catherine Berg is a 43 year old female who presents with rib pain.  She experiences right  rib pain that is noticeable when turning or taking a deep breath. The pain is localized, does not radiate, and is confined to one spot. No shortness of breath, back pain, or trauma is reported. She has not taken any medication for the rib pain.  She recently started on Wegovy two weeks ago, which has affected her appetite, making her feel like she has to make herself eat and drink. Despite this, she believes she is still urinating normally and denies any pain during urination. Her blood pressure medication was recently adjusted when she started [facility], and she has lost four pounds since then.  She traveled recently, taking a long trip over the weekend, but did not drive straight through, stopping multiple times to break up the journey. She has no history of blood clots and denies any leg pain.          07/27/2023    3:11 PM 07/14/2023    9:04 AM 07/12/2023   10:40 AM  Depression screen PHQ 2/9  Decreased Interest  1 1  Down, Depressed, Hopeless  1 1  PHQ - 2 Score  2 2  Altered sleeping  1   Tired, decreased energy  1   Change in appetite  1   Feeling bad or failure about yourself   0   Trouble concentrating  1   Moving slowly or fidgety/restless  0   Suicidal thoughts  0   PHQ-9 Score  6   Difficult doing work/chores  Very difficult      Information is  confidential and restricted. Go to Review Flowsheets to unlock data.    Relevant past medical, surgical, family and social history reviewed and updated as indicated. Interim medical history since our last visit reviewed. Allergies and medications reviewed and updated.  Review of Systems  Ten systems reviewed and is negative except as mentioned in HPI      Objective:    BP 126/82   Pulse 73   Resp 18   Ht 5\' 5"  (1.651 m)   Wt 230 lb 11.2 oz (104.6 kg)   SpO2 98%   BMI 38.39 kg/m    Wt Readings from Last 3 Encounters:  07/28/23 230 lb 11.2 oz (104.6 kg)  07/14/23 234 lb (106.1 kg)  07/12/23 234 lb (106.1 kg)    Physical Exam Physical Exam GENERAL: Alert, cooperative, well developed, no acute distress. HEENT: Normocephalic, normal oropharynx, moist mucous membranes. CHEST: Clear to auscultation bilaterally, no wheezes, rhonchi, or crackles. Tenderness noted on right ribs CARDIOVASCULAR: Normal heart rate and rhythm, S1 and S2 normal without murmurs. ABDOMEN: Soft, non-tender, non-distended, without organomegaly, normal bowel sounds. EXTREMITIES: No cyanosis or edema.  NEUROLOGICAL: Cranial nerves grossly intact, moves all extremities without gross motor or sensory deficit.   Results for orders placed or performed during the hospital encounter of 05/02/23  Basic metabolic panel   Collection Time: 05/03/23  5:56 AM  Result Value Ref Range   Sodium 135 135 - 145 mmol/L   Potassium 3.9 3.5 - 5.1 mmol/L   Chloride 101 98 - 111 mmol/L   CO2 23 22 - 32 mmol/L   Glucose, Bld 102 (H) 70 - 99 mg/dL   BUN 7 6 - 20 mg/dL   Creatinine, Ser 9.56 0.44 - 1.00 mg/dL   Calcium 9.2 8.9 - 21.3 mg/dL   GFR, Estimated >08 >65 mL/min   Anion gap 11 5 - 15  CBC   Collection Time: 05/03/23  5:56 AM  Result Value Ref Range   WBC 16.2 (H) 4.0 - 10.5 K/uL   RBC 3.66 (L) 3.87 - 5.11 MIL/uL   Hemoglobin 10.0 (L) 12.0 - 15.0 g/dL   HCT 78.4 (L) 69.6 - 29.5 %   MCV 83.3 80.0 - 100.0 fL   MCH  27.3 26.0 - 34.0 pg   MCHC 32.8 30.0 - 36.0 g/dL   RDW 28.4 13.2 - 44.0 %   Platelets 510 (H) 150 - 400 K/uL   nRBC 0.2 0.0 - 0.2 %  Aerobic/Anaerobic Culture w Gram Stain (surgical/deep wound)   Collection Time: 05/03/23  3:56 PM   Specimen: PATH Breast other; Tissue  Result Value Ref Range   Specimen Description TISSUE LEFT BREAST    Special Requests PT ON VANCOMYCIN    Gram Stain      RARE WBC PRESENT, PREDOMINANTLY PMN RARE GRAM NEGATIVE RODS    Culture      RARE CITROBACTER KOSERI FEW PREVOTELLA BIVIA BETA LACTAMASE POSITIVE Performed at [facility], [address] [place], [place] [address]    Report Status 05/08/2023 FINAL    Organism ID, Bacteria CITROBACTER KOSERI       Susceptibility   Citrobacter koseri - MIC*    CEFEPIME <=0.12 SENSITIVE Sensitive     CEFTAZIDIME <=1 SENSITIVE Sensitive     CEFTRIAXONE <=0.25 SENSITIVE Sensitive     CIPROFLOXACIN <=0.25 SENSITIVE Sensitive     GENTAMICIN <=1 SENSITIVE Sensitive     IMIPENEM <=0.25 SENSITIVE Sensitive     TRIMETH/SULFA <=20 SENSITIVE Sensitive     PIP/TAZO <=4 SENSITIVE Sensitive ug/mL    * RARE CITROBACTER KOSERI  Basic metabolic panel   Collection Time: 05/04/23  3:18 AM  Result Value Ref Range   Sodium 132 (L) 135 - 145 mmol/L   Potassium 3.7 3.5 - 5.1 mmol/L   Chloride 103 98 - 111 mmol/L   CO2 20 (L) 22 - 32 mmol/L   Glucose, Bld 141 (H) 70 - 99 mg/dL   BUN 9 6 - 20 mg/dL   Creatinine, Ser 5.36 0.44 - 1.00 mg/dL   Calcium 8.7 (L) 8.9 - 10.3 mg/dL   GFR, Estimated >64 >40 mL/min   Anion gap 9 5 - 15  CBC   Collection Time: 05/04/23  3:18 AM  Result Value Ref Range   WBC 14.8 (H) 4.0 - 10.5 K/uL   RBC 3.48 (L) 3.87 - 5.11 MIL/uL   Hemoglobin 9.5 (L) 12.0 - 15.0 g/dL   HCT 34.7 (L) 42.5 - 95.6 %   MCV 84.2 80.0 - 100.0 fL   MCH 27.3 26.0 - 34.0 pg   MCHC 32.4 30.0 - 36.0 g/dL   RDW  14.0 11.5 - 15.5 %   Platelets 495 (H) 150 - 400 K/uL   nRBC 0.2 0.0 - 0.2 %  Procalcitonin   Collection  Time: 05/04/23  3:18 AM  Result Value Ref Range   Procalcitonin 0.21 ng/mL  Basic metabolic panel   Collection Time: 05/05/23 10:48 AM  Result Value Ref Range   Sodium 135 135 - 145 mmol/L   Potassium 3.9 3.5 - 5.1 mmol/L   Chloride 106 98 - 111 mmol/L   CO2 20 (L) 22 - 32 mmol/L   Glucose, Bld 115 (H) 70 - 99 mg/dL   BUN 9 6 - 20 mg/dL   Creatinine, Ser 7.84 0.44 - 1.00 mg/dL   Calcium 8.9 8.9 - 69.6 mg/dL   GFR, Estimated >29 >52 mL/min   Anion gap 9 5 - 15  CBC   Collection Time: 05/05/23 10:48 AM  Result Value Ref Range   WBC 10.8 (H) 4.0 - 10.5 K/uL   RBC 3.63 (L) 3.87 - 5.11 MIL/uL   Hemoglobin 9.8 (L) 12.0 - 15.0 g/dL   HCT 84.1 (L) 32.4 - 40.1 %   MCV 83.5 80.0 - 100.0 fL   MCH 27.0 26.0 - 34.0 pg   MCHC 32.3 30.0 - 36.0 g/dL   RDW 02.7 25.3 - 66.4 %   Platelets 529 (H) 150 - 400 K/uL   nRBC 0.5 (H) 0.0 - 0.2 %  Vancomycin, trough   Collection Time: 05/05/23 10:48 AM  Result Value Ref Range   Vancomycin Tr 6 (L) 15 - 20 ug/mL  Comprehensive metabolic panel   Collection Time: 05/08/23  4:47 AM  Result Value Ref Range   Sodium 137 135 - 145 mmol/L   Potassium 3.4 (L) 3.5 - 5.1 mmol/L   Chloride 108 98 - 111 mmol/L   CO2 19 (L) 22 - 32 mmol/L   Glucose, Bld 96 70 - 99 mg/dL   BUN 14 6 - 20 mg/dL   Creatinine, Ser 4.03 0.44 - 1.00 mg/dL   Calcium 8.7 (L) 8.9 - 10.3 mg/dL   Total Protein 7.0 6.5 - 8.1 g/dL   Albumin 2.5 (L) 3.5 - 5.0 g/dL   AST 48 (H) 15 - 41 U/L   ALT 42 0 - 44 U/L   Alkaline Phosphatase 80 38 - 126 U/L   Total Bilirubin 0.4 0.0 - 1.2 mg/dL   GFR, Estimated >47 >42 mL/min   Anion gap 10 5 - 15  CBC with Differential/Platelet   Collection Time: 05/08/23  4:47 AM  Result Value Ref Range   WBC 9.0 4.0 - 10.5 K/uL   RBC 3.21 (L) 3.87 - 5.11 MIL/uL   Hemoglobin 8.7 (L) 12.0 - 15.0 g/dL   HCT 59.5 (L) 63.8 - 75.6 %   MCV 84.4 80.0 - 100.0 fL   MCH 27.1 26.0 - 34.0 pg   MCHC 32.1 30.0 - 36.0 g/dL   RDW 43.3 29.5 - 18.8 %   Platelets 466 (H)  150 - 400 K/uL   nRBC 0.3 (H) 0.0 - 0.2 %   Neutrophils Relative % 54 %   Neutro Abs 4.8 1.7 - 7.7 K/uL   Lymphocytes Relative 28 %   Lymphs Abs 2.6 0.7 - 4.0 K/uL   Monocytes Relative 7 %   Monocytes Absolute 0.6 0.1 - 1.0 K/uL   Eosinophils Relative 7 %   Eosinophils Absolute 0.6 (H) 0.0 - 0.5 K/uL   Basophils Relative 0 %   Basophils Absolute 0.0 0.0 - 0.1  K/uL   Immature Granulocytes 4 %   Abs Immature Granulocytes 0.38 (H) 0.00 - 0.07 K/uL  Comprehensive metabolic panel   Collection Time: 05/09/23  6:18 AM  Result Value Ref Range   Sodium 137 135 - 145 mmol/L   Potassium 4.0 3.5 - 5.1 mmol/L   Chloride 109 98 - 111 mmol/L   CO2 19 (L) 22 - 32 mmol/L   Glucose, Bld 108 (H) 70 - 99 mg/dL   BUN 16 6 - 20 mg/dL   Creatinine, Ser 1.30 0.44 - 1.00 mg/dL   Calcium 8.8 (L) 8.9 - 10.3 mg/dL   Total Protein 6.9 6.5 - 8.1 g/dL   Albumin 2.5 (L) 3.5 - 5.0 g/dL   AST 57 (H) 15 - 41 U/L   ALT 59 (H) 0 - 44 U/L   Alkaline Phosphatase 78 38 - 126 U/L   Total Bilirubin 0.2 0.0 - 1.2 mg/dL   GFR, Estimated >86 >57 mL/min   Anion gap 9 5 - 15  CBC with Differential/Platelet   Collection Time: 05/09/23  6:18 AM  Result Value Ref Range   WBC 9.4 4.0 - 10.5 K/uL   RBC 3.28 (L) 3.87 - 5.11 MIL/uL   Hemoglobin 8.8 (L) 12.0 - 15.0 g/dL   HCT 84.6 (L) 96.2 - 95.2 %   MCV 85.4 80.0 - 100.0 fL   MCH 26.8 26.0 - 34.0 pg   MCHC 31.4 30.0 - 36.0 g/dL   RDW 84.1 32.4 - 40.1 %   Platelets 454 (H) 150 - 400 K/uL   nRBC 0.0 0.0 - 0.2 %   Neutrophils Relative % 55 %   Neutro Abs 5.2 1.7 - 7.7 K/uL   Lymphocytes Relative 27 %   Lymphs Abs 2.6 0.7 - 4.0 K/uL   Monocytes Relative 6 %   Monocytes Absolute 0.6 0.1 - 1.0 K/uL   Eosinophils Relative 9 %   Eosinophils Absolute 0.8 (H) 0.0 - 0.5 K/uL   Basophils Relative 0 %   Basophils Absolute 0.0 0.0 - 0.1 K/uL   Immature Granulocytes 3 %   Abs Immature Granulocytes 0.24 (H) 0.00 - 0.07 K/uL  Magnesium   Collection Time: 05/09/23  6:18 AM   Result Value Ref Range   Magnesium 2.0 1.7 - 2.4 mg/dL  Procalcitonin   Collection Time: 05/09/23  6:18 AM  Result Value Ref Range   Procalcitonin <0.10 ng/mL       Assessment & Plan:   Problem List Items Addressed This Visit   None Visit Diagnoses       Rib pain on right side    -  Primary   Relevant Medications   naproxen (NAPROSYN) 500 MG tablet   cyclobenzaprine (FLEXERIL) 5 MG tablet        Assessment and Plan Assessment & Plan Musculoskeletal chest pain Localized rib pain exacerbated by movement and deep breathing, indicating a musculoskeletal origin. No trauma, shortness of breath, urinary issues, leg pain, or signs of deep vein thrombosis or pulmonary embolism. Normal vital signs. Recent long trip considered but no DVT or PE signs. - Prescribe naproxen twice daily for 10 days with food to reduce inflammation. - Prescribe a muscle relaxant at bedtime to aid in muscle relaxation. - Advise use of a heating pad for no more than 20 minutes at a time. - Instruct to send a message on Monday to report her condition. - Advise to report any new symptoms.  Weight management Started on Wegovy two weeks ago with a  weight loss of four pounds. Medication affects appetite, requiring conscious effort to eat and drink. Blood pressure is monitored due to reduced antihypertensive medication dosage. - Continue monitoring blood pressure with reduced medication dosage. - Encourage maintaining current regimen with YNWGNF.        Follow up plan: Return if symptoms worsen or fail to improve.

## 2023-08-05 ENCOUNTER — Ambulatory Visit (INDEPENDENT_AMBULATORY_CARE_PROVIDER_SITE_OTHER): Payer: No Typology Code available for payment source | Admitting: Licensed Clinical Social Worker

## 2023-08-05 DIAGNOSIS — F4310 Post-traumatic stress disorder, unspecified: Secondary | ICD-10-CM

## 2023-08-05 DIAGNOSIS — F411 Generalized anxiety disorder: Secondary | ICD-10-CM | POA: Diagnosis not present

## 2023-08-05 DIAGNOSIS — F330 Major depressive disorder, recurrent, mild: Secondary | ICD-10-CM

## 2023-08-05 NOTE — Progress Notes (Signed)
   THERAPIST PROGRESS NOTE  Session Time: 3:05pm-4:05pm  Participation Level: Active  Behavioral Response: CasualAlertEuthymic  Type of Therapy: Individual Therapy  LTG: Reduce frequency, intensity, and duration of depression symptoms so that daily functioning is improved    STG: Laree will identify cognitive patterns and beliefs that support depression    LTG: "I want to be where the things I don't have control over, I can learn to let go."     ProgressTowards Goals: Progressing   Interventions: Supportive and Other: EMDR   Summary: Catherine Berg is a 43 y.o. female who presents with anxiety.  Patient identifies symptoms to include uncontrollable worry, negative self affect, low mood, anxious feelings, tearfulness, avoidance, restlessness. Pt was oriented times 5. Pt was cooperative and engaged. Pt denies SI/HI/AVH.  The patient used the therapeutic space to explore the concept of her "container." She recognized that she has created a physical representation of this container, which has become a valuable resource for her. However, she continues to struggle with memory issues. The clinician provided psychoeducation on how stress and trauma can impact memory.   The patient noted that her mood has remained consistent, despite experiencing feelings of grief related to her mother's passing and the unexpected loss of her nephew, who moved out of the home. She is actively working to process her grief and is trying to avoid personalizing these feelings. The patient often grapples with the thought, "I have to be in control."   During the session, both the clinician and the patient reflected on her tendency to care for others at the expense of her own well-being. She acknowledges that she frequently supports others financially, even when it's not safe for her to do so. They discussed ways the patient can begin to establish healthy boundaries, including engaging in a 12-hour reflection before  deciding how to support others.  The patient continued with EMDR therapy. The clinician taught her various breathing techniques, including acupressure breathing, belly breathing, 7-11 breathing, and eye roll breathing. The patient identified 7-11 breathing as the most effective technique for her and will practice it regularly.  Suicidal/Homicidal: Nowithout intent/plan  Therapist Response: Cln utilized active and supportive reflection to create a safe environment for patient to process recent life stressors. Clinician assessed for current symptoms, stressors, safety since last session. Clinician utilized EMDR to begin resourcing with the patient.   Plan: Return again in 4 weeks.  Diagnosis:PTSD (post-traumatic stress disorder)  MDD (major depressive disorder), recurrent episode, mild (HCC)  GAD (generalized anxiety disorder)   Collaboration of Care: AEB psychiatrist can access notes and cln. Will review psychiatrists' notes. Check in with the patient and will see LCSW per availability. Patient agreed with treatment recommendations.   Patient/Guardian was advised Release of Information must be obtained prior to any record release in order to collaborate their care with an outside provider. Patient/Guardian was advised if they have not already done so to contact the registration department to sign all necessary forms in order for [place] to release information regarding their care.   Consent: Patient/Guardian gives verbal consent for treatment and assignment of benefits for services provided during this visit. Patient/Guardian expressed understanding and agreed to proceed.   Dereck Leep, LCSW 08/05/2023

## 2023-08-06 ENCOUNTER — Other Ambulatory Visit: Payer: Self-pay | Admitting: Family Medicine

## 2023-08-06 MED ORDER — SEMAGLUTIDE-WEIGHT MANAGEMENT 0.5 MG/0.5ML ~~LOC~~ SOAJ: 0.5000 mg | SUBCUTANEOUS | 0 refills | Status: DC

## 2023-08-06 MED ORDER — SEMAGLUTIDE (1 MG/DOSE) 4 MG/3ML ~~LOC~~ SOPN: 1.0000 mg | PEN_INJECTOR | SUBCUTANEOUS | 0 refills | Status: DC

## 2023-08-06 NOTE — Telephone Encounter (Signed)
Pt would like to increase dose.

## 2023-08-09 ENCOUNTER — Telehealth: Payer: Self-pay | Admitting: Family Medicine

## 2023-08-09 ENCOUNTER — Telehealth: Payer: Self-pay

## 2023-08-09 NOTE — Telephone Encounter (Signed)
 Pt called back stating the pharmacy told her its the ozempic prescription that was sent in. I verbalized to patient it looks like it is the wegovy. Please advice it needs to be wegovy 0.5 dose

## 2023-08-09 NOTE — Telephone Encounter (Signed)
 PA done through [facility] form faxed today.

## 2023-08-09 NOTE — Telephone Encounter (Signed)
 Copied from CRM [id number]. Topic: Clinical - Prescription Issue >> Aug 09, 2023 10:13 AM Franchot Heidelberg wrote: Reason for CRM: Pt has questions about how a medication was called in  Best contact: [phone]

## 2023-08-09 NOTE — Telephone Encounter (Signed)
 Cover My Meds prior auth on   Semaglutide-Weight Management 0.5 MG/0.5ML SOAJ    Key [id number]

## 2023-08-10 ENCOUNTER — Encounter: Payer: Self-pay | Admitting: Family Medicine

## 2023-08-10 ENCOUNTER — Other Ambulatory Visit: Payer: Self-pay | Admitting: Family Medicine

## 2023-08-10 MED ORDER — WEGOVY 0.5 MG/0.5ML ~~LOC~~ SOAJ: 0.5000 mg | SUBCUTANEOUS | 0 refills | Status: DC

## 2023-08-12 ENCOUNTER — Encounter: Payer: Self-pay | Admitting: Family Medicine

## 2023-08-12 ENCOUNTER — Ambulatory Visit: Admitting: Nurse Practitioner

## 2023-08-12 ENCOUNTER — Telehealth: Payer: Self-pay | Admitting: Family Medicine

## 2023-08-12 VITALS — BP 132/83 | HR 82 | Ht 63.0 in | Wt 225.4 lb

## 2023-08-12 DIAGNOSIS — Z3009 Encounter for other general counseling and advice on contraception: Secondary | ICD-10-CM

## 2023-08-12 DIAGNOSIS — Z30017 Encounter for initial prescription of implantable subdermal contraceptive: Secondary | ICD-10-CM | POA: Diagnosis not present

## 2023-08-12 DIAGNOSIS — Z0000 Encounter for general adult medical examination without abnormal findings: Secondary | ICD-10-CM

## 2023-08-12 MED ORDER — ETONOGESTREL 68 MG ~~LOC~~ IMPL
68.0000 mg | DRUG_IMPLANT | Freq: Once | SUBCUTANEOUS | Status: AC
  Administered 2023-08-12: 68 mg via SUBCUTANEOUS

## 2023-08-12 NOTE — Telephone Encounter (Signed)
 Prior Auth from [employer] (WEGOVY) 0.5 MG/0.5ML SOAJ    KeyGinnie Smart

## 2023-08-12 NOTE — Progress Notes (Signed)
 Pt is here for FP visit.  Nexplanon card given. Gaspar Garbe, RN

## 2023-08-12 NOTE — Progress Notes (Signed)
 [employer] HEALTH DEPARTMENT [facility] [address], [address] [place] [address] Main phone: [phone]  Family Planning Visit - Initial Visit  Subjective:  Catherine Berg is a 43 y.o.  [phone]   being seen today for an initial annual visit and to discuss reproductive life planning.  The patient is currently using hormonal implant for pregnancy prevention. Patient does not want a pregnancy in the next year.   Patient reports they are looking for a method with the following characteristics:  High efficacy at preventing pregnancy Discrete method Long term method Method that does not involve too much memory  Patient has the following medical conditions: Patient Active Problem List   Diagnosis Date Noted   Acute postoperative anemia due to expected blood loss 05/09/2023   Hypokalemia 05/08/2023   Hyponatremia 05/04/2023   Cellulitis of left breast 05/02/2023   Post op infection of left breast 04/29/2023   Essential hypertension 04/29/2023   Acquired hypothyroidism 04/29/2023   Depression with anxiety 04/29/2023   Asthma 05/12/2022   MDD (major depressive disorder), recurrent episode, mild (HCC) 11/17/2021   Neuropathy involving both lower extremities 11/17/2021   Perennial allergic rhinitis with seasonal variation 11/17/2021   Chronic pain of both lower extremities 09/24/2020   Sleeps in sitting position due to orthopnea 07/02/2020   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   Vitamin D  deficiency 04/22/2020   COVID-19 long hauler manifesting chronic fatigue 04/22/2020   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   Memory loss 02/28/2020   Physical deconditioning 02/28/2020   Dyspnea on exertion 02/28/2020   Neck pain, bilateral 02/28/2020   Muscle spasms of neck 02/28/2020   Arthralgia of hand 01/24/2019   Class 3 severe obesity due to excess calories without serious comorbidity in adult [facility]) 01/24/2019   Palpitations 09/16/2018   Coronary artery  calcification 09/16/2018   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 female who presents to the clinic to have an annual well-woman exam without PAP, a CBE, and her hormonal implant removed and a new one reinserted. She reports no concerns today. Of note, patient reports an extensive breast history including benign right breast mass, bilateral breast reduction with complication requiring surgical intervention.  Patient indicated last sexual intercourse was 3 years ago. She reports female partners, vaginal sex and condom use sometimes.  Review of Systems  Constitutional:  Negative for weight loss.  HENT:  Negative for sore throat.   Eyes:  Negative for blurred vision.  Respiratory:  Negative for cough, shortness of breath and wheezing.   Cardiovascular:  Negative for chest pain and claudication.  Gastrointestinal:  Negative for nausea and vomiting.  Genitourinary:  Negative for dysuria and frequency.  Skin:  Negative for rash.  Neurological:  Negative for dizziness, seizures and headaches.  Endo/Heme/Allergies:  Does not bruise/bleed easily.    Diabetes screening This patient is 43 y.o. with a BMI of Body mass index is 39.93 kg/m.Aaron Aas  Is patient eligible for diabetes screening (age >35 and BMI >25)?  no  Was Hgb A1c ordered? not applicable-Patient has a regular PCP>   STI screening Patient reports 0 partners in last year.  Does this  patient desire STI screening?  No - Not sexually active.   Hepatitis C screening Has patient been screened once for HCV in the past?  Yes   Lab Results  Component Value Date   HCVAB NEGATIVE 01/08/2016    Does the patient meet criteria for HCV testing? Not assess. Patient declined STI testing.   (If yes-- Screen for HCV through [facility]) Criteria:  Since the last HCV result, does the patient have any of the following? - Current drug use - Have a partner with drug use - Has been incarcerated  Hepatitis B screening Does the patient meet criteria for HBV testing? Not assessed. Patient declined STI testing.  Criteria:  -Household, sexual or needle sharing contact with HBV -History of drug use -HIV positive -Those with known Hep C  Cervical Cancer Screening  Result Date Procedure Results Follow-ups  06/17/2022 Cytology - PAP High risk HPV: Negative Adequacy: Satisfactory for evaluation; transformation zone component PRESENT. Diagnosis: - Negative for intraepithelial lesion or malignancy (NILM) Microorganisms: Shift in flora suggestive of bacterial vaginosis Comment: Normal Reference Range HPV - Negative   03/28/2019 Cytology - PAP High risk HPV: Negative Neisseria Gonorrhea: Negative Chlamydia: Negative Trichomonas: Negative Adequacy: Satisfactory for evaluation; transformation zone component PRESENT. Diagnosis: - Negative for intraepithelial lesion or malignancy (NILM)   06/04/2016 Pap IG, CT/NG NAA, and HPV (high risk) DIAGNOSIS:: Comment Gonococcus by Nucleic Acid Amp: Negative Specimen adequacy:: Comment Clinician Provided ICD10: Comment Performed by:: Comment PAP Smear Comment: . Note:: Comment Test Methodology: Comment HPV, high-risk: Negative Chlamydia, Nuc. Acid Amp: Negative   06/27/2014 HM PAP SMEAR HM Pap smear: per PP     Health Maintenance Due  Topic Date Due   Medicare Annual Wellness (AWV)  Never done    The following portions of the patient's  history were reviewed and updated as appropriate: allergies, current medications, past family history, past medical history, past social history, past surgical history and problem list. Problem list updated.  See flowsheet for other program required questions.  Objective:   Vitals:   08/12/23 1311  BP: 132/83  Pulse: 82  Weight: 225 lb 6.4 oz (102.2 kg)  Height: 5\' 3"  (1.6 m)   PHQ-9=7 Patient denies SI.   Physical Exam Vitals and nursing note reviewed.  Constitutional:      Appearance: Normal appearance.  HENT:     Head: Normocephalic.     Salivary Glands: Right salivary gland is not diffusely enlarged or tender. Left salivary gland is not diffusely enlarged or tender.     Mouth/Throat:     Lips: Pink. No lesions.     Mouth: Mucous membranes are moist.     Tongue: No lesions. Tongue does not deviate from midline.     Pharynx: Oropharynx is clear. Uvula midline.     Tonsils: No tonsillar exudate.  Eyes:     General:        Right eye: No discharge.        Left eye: No discharge.     Conjunctiva/sclera:     Right eye: Right conjunctiva is not injected.     Left eye: Left conjunctiva is not injected.  Neck:     Thyroid : No thyroid  mass, thyromegaly or thyroid  tenderness.     Trachea: Trachea  and phonation normal. No tracheal tenderness or tracheal deviation.  Cardiovascular:     Rate and Rhythm: Normal rate and regular rhythm.     Heart sounds: Normal heart sounds, S1 normal and S2 normal.  Pulmonary:     Effort: Pulmonary effort is normal.     Breath sounds: Normal breath sounds and air entry.  Chest:       Comments: Area in blue in diagram represents healed surgical scarring. Area of bright pink in diagram indicates multiple areas of hardened palpable masses. None completely round or regular borders.   Abdominal:     General: Abdomen is flat. Bowel sounds are normal. There is no distension.     Palpations: Abdomen is soft.     Tenderness: There is no abdominal  tenderness. There is no guarding or rebound.  Lymphadenopathy:     Head:     Right side of head: No submental, submandibular, tonsillar, preauricular or posterior auricular adenopathy.     Left side of head: No submental, submandibular, tonsillar, preauricular or posterior auricular adenopathy.     Cervical: No cervical adenopathy.     Right cervical: No superficial or posterior cervical adenopathy.    Left cervical: No superficial or posterior cervical adenopathy.     Upper Body:     Right upper body: No supraclavicular or axillary adenopathy.     Left upper body: No supraclavicular or axillary adenopathy.  Skin:    General: Skin is warm and dry.     Findings: No rash.  Neurological:     Mental Status: She is alert and oriented to person, place, and time.  Psychiatric:        Attention and Perception: Attention normal.        Mood and Affect: Mood and affect normal.        Speech: Speech normal.        Behavior: Behavior normal. Behavior is cooperative.        Thought Content: Thought content normal.     Assessment and Plan:  Catherine Berg is a 43 y.o. female presenting to the [facility] Department for an initial annual wellness/contraceptive visit   1. Family planning (Primary)  Nexplanon  Removal and Insertion  Patient identified, informed consent performed, consent signed.   Patient does understand that irregular bleeding is a very common side effect of this medication. She was advised to have backup contraception for one week after replacement of the implant. Patient deemed to meet WHO criteria for being reasonably certain she is not pregnant.  Appropriate time out taken. Nexplanon  site identified. Area prepped in usual sterile fashon. 3 ml of 1% lidocaine  with epinephrine  was used to anesthetize the area at the distal end of the implant. A small stab incision was made right beside the implant on the distal portion. The Nexplanon  rod was grasped using hemostats and  removed without difficulty. There was minimal blood loss. There were no complications.   Confirmed correct location of insertion site. The insertion site was identified 8-10 cm (3-4 inches) from the medial epicondyle of the humerus and 3-5 cm (1.25-2 inches) posterior to (below) the sulcus (groove) between the biceps and triceps muscles of the patient's left arm. New Nexplanon  removed from packaging, Device confirmed in needle, then inserted full length of needle and withdrawn per handbook instructions. Nexplanon  was able to palpated in the patient's left arm; patient palpated the insert herself.  There was minimal blood loss. Patient insertion site covered with guaze and a  pressure bandage to reduce any bruising. The patient tolerated the procedure well and was given post procedure instructions.     Counseled patient to take OTC analgesic starting as soon as lidocaine  starts to wear off and take regularly for at least 48 hr to decrease discomfort.  Specifically to take with food or milk to decrease stomach upset and for IB 600 mg (3 tablets) every 6 hrs; IB 800 mg (4 tablets) every 8 hrs; or Aleve  2 tablets every 12 hrs.   Contraception counseling: Reviewed options based on patient desire and reproductive life plan. Patient is interested in Hormonal Implant. This was provided to the patient today.   Risks, benefits, and typical effectiveness rates were reviewed.  Questions were answered.  Written information was also given to the patient to review.    The patient will follow up in  1 years for surveillance.  The patient was told to call with any further questions, or with any concerns about this method of contraception.  Emphasized use of condoms 100% of the time for STI prevention.  Educated on ECP and assessed need for ECP. Patient does not qualify for ECP based on no unprotected sex.  - etonogestrel  (NEXPLANON ) implant 68 mg  2. Well woman exam (no gynecological exam) PAP not due today. Due  06/2027. CBE completed today. Discussed breast exam finding with patient. With patient breast history she reports having a breast specialist that she sees for follow-ups and reports her next appointment is in 4 months. She verbalized that she would schedule a follow-up appointment to evaluate today's findings before her next scheduled follow-up.  Regarding PHQ-9, patient reports no suicidal ideation and that she is safe. She reports that she is on prescription medication to help with her depression and that she sees a therapist.   Return in about 1 year (around 08/11/2024) for annual well-woman exam.  Future Appointments  Date Time Provider Department Center  08/23/2023  2:45 PM Selena Daily, MD [facility] None  09/02/2023  1:00 PM Almetta Jacquet B, LCSW [facility] None  09/06/2023  2:45 PM Cleve Dale, MD [facility] None  09/14/2023  3:00 PM Todd Fossa, MD [facility] None  10/04/2023  3:00 PM Marvin Slot, LCSW [facility] None  10/12/2023 11:00 AM Raulkar, Keven Pel, MD [facility] CPR  10/14/2023 10:20 AM Arleen Lacer, MD [facility] PEC  12/22/2023 11:45 AM Teretha Ferguson, MD [facility] None    Merleen Stare, NP

## 2023-08-12 NOTE — Telephone Encounter (Signed)
 PA done through [facility] [facility] waiting on there response.

## 2023-08-23 ENCOUNTER — Encounter: Payer: Self-pay | Admitting: Gastroenterology

## 2023-08-23 ENCOUNTER — Ambulatory Visit (INDEPENDENT_AMBULATORY_CARE_PROVIDER_SITE_OTHER): Payer: Medicare Other | Admitting: Gastroenterology

## 2023-08-23 VITALS — BP 145/90 | HR 80 | Temp 97.9°F | Ht 63.0 in | Wt 222.1 lb

## 2023-08-23 DIAGNOSIS — Z862 Personal history of diseases of the blood and blood-forming organs and certain disorders involving the immune mechanism: Secondary | ICD-10-CM

## 2023-08-23 DIAGNOSIS — K625 Hemorrhage of anus and rectum: Secondary | ICD-10-CM

## 2023-08-23 DIAGNOSIS — K5909 Other constipation: Secondary | ICD-10-CM

## 2023-08-23 DIAGNOSIS — Z800 Family history of malignant neoplasm of digestive organs: Secondary | ICD-10-CM

## 2023-08-23 DIAGNOSIS — Z8619 Personal history of other infectious and parasitic diseases: Secondary | ICD-10-CM

## 2023-08-23 MED ORDER — LINACLOTIDE 145 MCG PO CAPS: 145.0000 ug | ORAL_CAPSULE | Freq: Every day | ORAL | 1 refills | Status: DC

## 2023-08-23 NOTE — Progress Notes (Signed)
 Catherine Oz, MD [address]  [address]  [place], [place] [address]  Main: [phone]  Fax: [phone]    Gastroenterology Consultation  Referring Provider:     Arleen Lacer, MD Primary Care Physician:  Arleen Lacer, MD Primary Gastroenterologist:  Dr. Rosezella Conte, PA-C Reason for Consultation: Chronic constipation, rectal bleeding, anemia        HPI:   MARKEIA Berg is a 43 y.o. female referred by Dr. Arleen Lacer, MD  for consultation & management of chronic constipation, rectal bleeding and anemia.  Patient was closely followed by Brigitte Canard for rectal pain, bleeding and constipation.  She was treated for anal fissure and recommended to undergo colonoscopy.  Patient has been anemic since her breast reduction surgery which was complicated by wound infection.  She reports that rectal pain and bleeding have significantly improved.  Continues to have severe constipation.  She tried Linzess  and does not like the feeling of taking Linzess  in order to move her bowels, feels dependent on the medication.  She has tried 72 which did not help and 145 mcg caused 1-2 loose bowel movements.  Her bowel movements are never formed.  She also reports significant upper abdominal bloating along with nausea.  She was treated for H. pylori infection in 02/2023 after positive breath test.  She did not undergo repeat breath test to confirm eradication.  She recently started taking Wegovy  for weight loss  NSAIDs: None  Antiplts/Anticoagulants/Anti thrombotics: None  GI Procedures:  Upper endoscopy and colonoscopy 11/14/2018 - Normal esophagus. Biopsied. - Normal stomach. - Normal duodenal bulb, second portion of the duodenum and examined duodenum. Biopsied.  - Thickened folds of the mucosa in the cecum. Biopsied. The biopsy results are expected to benign. - The rectum, sigmoid colon, descending colon, transverse colon, ascending colon and cecum are normal.  Normal  pathology results  Past Medical History:  Diagnosis Date   Abnormal thyroid  blood test    Allergy    Anemia    Anxiety    Asthma    Complication of anesthesia    itching after c sections   COVID-19 01/2020   Depression    Elevated serum glutamic pyruvic transaminase (SGPT) level    Graves disease    History of abnormal cervical Pap smear    History of cervical polypectomy    Hives 09/02/2015   Hypertension    Hypothyroidism    IFG (impaired fasting glucose)    Incidental lung nodule, > 3mm and < 8mm 03/31/2017   4 mm RLL lung nodule on chest CT Mar 31, 2017   Low serum vitamin D     Neuromuscular disorder (HCC)    Obesity    PONV (postoperative nausea and vomiting)    after c section had vomit   Pregnancy induced hypertension    with 1st pregnancy, normal pressure with 2nd   Reflux    Sprain of right great toe 01/21/2023   Thyroid  disease    Vitamin B12 deficiency    Vitamin D  deficiency disease    Wears dentures    full upper    Past Surgical History:  Procedure Laterality Date   BREAST REDUCTION SURGERY Bilateral 04/13/2023   Procedure: MAMMARY REDUCTION  (BREAST);  Surgeon: Teretha Ferguson, MD;  Location: [facility];  Service: Plastics;  Laterality: Bilateral;   BREAST SURGERY     CERVICAL POLYPECTOMY     CESAREAN SECTION     x 2   CHOLECYSTECTOMY  COLONOSCOPY WITH PROPOFOL  N/A 11/14/2018   Procedure: COLONOSCOPY WITH PROPOFOL ;  Surgeon: Irby Mannan, MD;  Location: [facility];  Service: Endoscopy;  Laterality: N/A;   ESOPHAGOGASTRODUODENOSCOPY (EGD) WITH PROPOFOL  N/A 11/14/2018   Procedure: ESOPHAGOGASTRODUODENOSCOPY (EGD) WITH PROPOFOL ;  Surgeon: Irby Mannan, MD;  Location: [facility];  Service: Endoscopy;  Laterality: N/A;  Latex allergy   GIVENS CAPSULE STUDY N/A 12/27/2018   Procedure: GIVENS CAPSULE STUDY;  Surgeon: Irby Mannan, MD;  Location: [facility];  Service: Endoscopy;  Laterality:  N/A;   INCISION AND DRAINAGE OF WOUND Left 05/03/2023   Procedure: IRRIGATION AND DEBRIDEMENT WOUND;  Surgeon: Teretha Ferguson, MD;  Location: [facility];  Service: Plastics;  Laterality: Left;     Current Outpatient Medications:    amLODipine  (NORVASC ) 2.5 MG tablet, Take 1 tablet (2.5 mg total) by mouth daily., Disp: 90 tablet, Rfl: 0   bisacodyl  (DULCOLAX) 5 MG EC tablet, Take 1 tablet (5 mg total) by mouth daily as needed for moderate constipation., Disp: 30 tablet, Rfl: 1   busPIRone  (BUSPAR ) 5 MG tablet, Take 1 tablet (5 mg total) by mouth 2 (two) times daily., Disp: 180 tablet, Rfl: 0   cyclobenzaprine  (FLEXERIL ) 5 MG tablet, Take 1 tablet (5 mg total) by mouth at bedtime., Disp: 30 tablet, Rfl: 0   DULoxetine  (CYMBALTA ) 60 MG capsule, Take 1 capsule (60 mg total) by mouth daily., Disp: 90 capsule, Rfl: 0   GAVILYTE-N  WITH FLAVOR PACK 420 g solution, , Disp: , Rfl:    hydrochlorothiazide  (HYDRODIURIL ) 12.5 MG tablet, Take 1 tablet (12.5 mg total) by mouth daily., Disp: 90 tablet, Rfl: 0   levothyroxine  (SYNTHROID ) 100 MCG tablet, Take 100 mcg by mouth daily before breakfast., Disp: , Rfl:    linaclotide  (LINZESS ) 145 MCG CAPS capsule, Take 1 capsule (145 mcg total) by mouth daily before breakfast., Disp: 90 capsule, Rfl: 1   metFORMIN  (GLUCOPHAGE ) 500 MG tablet, Take 1 tablet (500 mg total) by mouth daily with breakfast. (Patient taking differently: Take 500 mg by mouth daily with breakfast. Takes for long covid neuropathy), Disp: 90 tablet, Rfl: 3   mometasone -formoterol  (DULERA ) 200-5 MCG/ACT AERO, Inhale 2 puffs into the lungs in the morning and at bedtime., Disp: 1 each, Rfl: 11   montelukast  (SINGULAIR ) 10 MG tablet, Take 1 tablet (10 mg total) by mouth at bedtime., Disp: 90 tablet, Rfl: 1   omeprazole  (PRILOSEC) 20 MG capsule, Take 1 capsule (20 mg total) by mouth 2 (two) times daily before a meal., Disp: 90 capsule, Rfl: 3   Semaglutide -Weight Management (WEGOVY ) 0.5 MG/0.5ML SOAJ,  Inject 0.5 mg into the skin once a week., Disp: 2 mL, Rfl: 0   topiramate  (TOPAMAX ) 100 MG tablet, Take 1 tablet (100 mg total) by mouth at bedtime., Disp: 90 tablet, Rfl: 3   traZODone  (DESYREL ) 100 MG tablet, Take 1 tablet (100 mg total) by mouth at bedtime as needed for sleep., Disp: 30 tablet, Rfl: 1   VENTOLIN  HFA 108 (90 Base) MCG/ACT inhaler, INHALE 2 PUFFS BY MOUTH EVERY 6 HOURS AS NEEDED FOR WHEEZING OR SHORTNESS OF BREATH, Disp: 18 g, Rfl: 1   Vitamin D , Ergocalciferol , (DRISDOL ) 1.25 MG (50000 UNIT) CAPS capsule, Take 1 capsule (50,000 Units total) by mouth every 7 (seven) days., Disp: 12 capsule, Rfl: 1   ferrous sulfate  325 (65 FE) MG EC tablet, Take 1 tablet (325 mg total) by mouth 2 (two) times daily for 10 days., Disp: 20 tablet, Rfl: 0  Family History  Problem Relation Age of Onset   Heart attack Mother 62   Hypertension Mother    Asthma Mother    Cancer Mother        laryngeal   Diabetes Mother        pre diabetic   Heart disease Mother    Mental illness Mother    Alcohol abuse Mother    Drug abuse Mother    Depression Mother    Anxiety disorder Mother    Bipolar disorder Mother    Learning disabilities Brother    ADD / ADHD Brother    Asthma Son    Hyperlipidemia Brother    Alcohol abuse Father    Heart disease Maternal Grandmother        heart attack, pacemaker   Heart attack Maternal Grandmother    Hypertension Maternal Grandmother    Hypertension Maternal Grandfather    Heart disease Paternal Grandmother        couple of major open heart surgeries, leaking valves   Hypertension Paternal Grandmother    Hypertension Paternal Grandfather    Breast cancer Neg Hx      Social History   Tobacco Use   Smoking status: Never   Smokeless tobacco: Never  Vaping Use   Vaping status: Never Used  Substance Use Topics   Alcohol use: No   Drug use: No    Allergies as of 08/23/2023 - Review Complete 08/23/2023  Allergen Reaction Noted   Latex Hives  07/30/2014   Shellfish allergy Hives 07/30/2014    Review of Systems:    All systems reviewed and negative except where noted in HPI.   Physical Exam:  BP (!) 145/90 (BP Location: Right Arm, Patient Position: Sitting, Cuff Size: Large)   Pulse 80   Temp 97.9 F (36.6 C) (Oral)   Ht 5\' 3"  (1.6 m)   Wt 222 lb 2 Berg (100.8 kg)   BMI 39.35 kg/m  No LMP recorded. Patient has had an implant.  General:   Alert,  Well-developed, well-nourished, pleasant and cooperative in NAD Head:  Normocephalic and atraumatic. Eyes:  Sclera clear, no icterus.   Conjunctiva pink. Ears:  Normal auditory acuity. Nose:  No deformity, discharge, or lesions. Mouth:  No deformity or lesions,oropharynx pink & moist. Neck:  Supple; no masses or thyromegaly. Lungs:  Respirations even and unlabored.  Clear throughout to auscultation.   No wheezes, crackles, or rhonchi. No acute distress. Heart:  Regular rate and rhythm; no murmurs, clicks, rubs, or gallops. Abdomen:  Normal bowel sounds. Soft, non-tender and non-distended without masses, hepatosplenomegaly or hernias noted.  No guarding or rebound tenderness.   Rectal: Not performed Msk:  Symmetrical without gross deformities. Good, equal movement & strength bilaterally. Pulses:  Normal pulses noted. Extremities:  No clubbing or edema.  No cyanosis. Neurologic:  Alert and oriented x3;  grossly normal neurologically. Skin:  Intact without significant lesions or rashes. No jaundice. Psych:  Alert and cooperative. Normal mood and affect.  Imaging Studies: Reviewed  Assessment and Plan:   KATRINA DADDONA is a 43 y.o. female with obesity BMI 39, hypothyroidism, hypertension is seen in consultation for rectal pain, bleeding, chronic constipation.  Rectal pain and bleeding have significantly improved after treating for anal fissure.  She suffers from severe constipation  Chronic constipation Discussed about high-fiber diet, adequate intake of water  Significantly  cut back on consumption of red meat low-fat, low-carb diet, exercise Recommend to take Linzess  145 mcg daily  Recommend H.  pylori breath test to confirm eradication, patient was treated in November 2020 for positive H. pylori breath test  With history of anemia after breast reduction surgery, recommend to recheck CBC, iron panel, B12 and folate levels Her colonoscopy in 2020 was unremarkable with only 1 second-degree relative with colon cancer Do not recommend colonoscopy at this time   Follow up as needed   Catherine Oz, MD

## 2023-08-24 ENCOUNTER — Telehealth: Payer: Self-pay

## 2023-08-24 LAB — IRON,TIBC AND FERRITIN PANEL
Ferritin: 41 ng/mL (ref 15–150)
Iron Saturation: 12 % — ABNORMAL LOW (ref 15–55)
Iron: 40 ug/dL (ref 27–159)
Total Iron Binding Capacity: 338 ug/dL (ref 250–450)
UIBC: 298 ug/dL (ref 131–425)

## 2023-08-24 LAB — CBC WITH DIFFERENTIAL/PLATELET
Basophils Absolute: 0 10*3/uL (ref 0.0–0.2)
Basos: 1 %
EOS (ABSOLUTE): 0.1 10*3/uL (ref 0.0–0.4)
Eos: 1 %
Hematocrit: 38.9 % (ref 34.0–46.6)
Hemoglobin: 12.5 g/dL (ref 11.1–15.9)
Immature Grans (Abs): 0 10*3/uL (ref 0.0–0.1)
Immature Granulocytes: 0 %
Lymphocytes Absolute: 3.7 10*3/uL — ABNORMAL HIGH (ref 0.7–3.1)
Lymphs: 56 %
MCH: 26.5 pg — ABNORMAL LOW (ref 26.6–33.0)
MCHC: 32.1 g/dL (ref 31.5–35.7)
MCV: 83 fL (ref 79–97)
Monocytes Absolute: 0.4 10*3/uL (ref 0.1–0.9)
Monocytes: 7 %
Neutrophils Absolute: 2.3 10*3/uL (ref 1.4–7.0)
Neutrophils: 35 %
Platelets: 356 10*3/uL (ref 150–450)
RBC: 4.71 x10E6/uL (ref 3.77–5.28)
RDW: 14.9 % (ref 11.7–15.4)
WBC: 6.5 10*3/uL (ref 3.4–10.8)

## 2023-08-24 LAB — B12 AND FOLATE PANEL
Folate: 6.7 ng/mL (ref >3.0)
Vitamin B-12: 248 pg/mL (ref 232–1245)

## 2023-08-24 LAB — H. PYLORI BREATH TEST: H pylori Breath Test: NEGATIVE

## 2023-08-24 NOTE — Telephone Encounter (Signed)
 Patient verbalized understanding of results

## 2023-08-24 NOTE — Telephone Encounter (Signed)
-----   Message from [facility] sent at 08/24/2023 11:18 AM EDT ----- Good to see that her anemia has resolved and is no longer having iron or B12 deficiency  RV

## 2023-08-25 ENCOUNTER — Encounter: Payer: Self-pay | Admitting: Gastroenterology

## 2023-09-01 ENCOUNTER — Telehealth: Payer: Self-pay | Admitting: Family Medicine

## 2023-09-01 ENCOUNTER — Encounter: Payer: Self-pay | Admitting: Family Medicine

## 2023-09-01 ENCOUNTER — Other Ambulatory Visit: Payer: Self-pay | Admitting: Family Medicine

## 2023-09-01 MED ORDER — WEGOVY 1 MG/0.5ML ~~LOC~~ SOAJ: 1.0000 mg | SUBCUTANEOUS | 0 refills | Status: DC

## 2023-09-01 NOTE — Telephone Encounter (Signed)
 Semaglutide -Weight Management (WEGOVY ) 1 MG/0.5ML SOAJ   Key: [id number]

## 2023-09-01 NOTE — Telephone Encounter (Signed)
 It was sent through paperwork have not heard back from they.

## 2023-09-02 ENCOUNTER — Ambulatory Visit: Payer: No Typology Code available for payment source | Admitting: Licensed Clinical Social Worker

## 2023-09-02 ENCOUNTER — Encounter: Payer: Self-pay | Admitting: Gastroenterology

## 2023-09-06 ENCOUNTER — Encounter: Payer: Self-pay | Admitting: Internal Medicine

## 2023-09-06 ENCOUNTER — Ambulatory Visit (INDEPENDENT_AMBULATORY_CARE_PROVIDER_SITE_OTHER): Admitting: Internal Medicine

## 2023-09-06 VITALS — BP 122/88 | HR 82 | Temp 98.8°F | Ht 63.0 in | Wt 222.2 lb

## 2023-09-06 DIAGNOSIS — J4520 Mild intermittent asthma, uncomplicated: Secondary | ICD-10-CM

## 2023-09-06 DIAGNOSIS — R918 Other nonspecific abnormal finding of lung field: Secondary | ICD-10-CM

## 2023-09-06 DIAGNOSIS — R0689 Other abnormalities of breathing: Secondary | ICD-10-CM

## 2023-09-06 LAB — NITRIC OXIDE: Nitric Oxide: 16

## 2023-09-06 MED ORDER — FLUTICASONE PROPIONATE 50 MCG/ACT NA SUSP: 2.0000 | Freq: Every day | NASAL | 5 refills | Status: AC

## 2023-09-06 MED ORDER — VENTOLIN HFA 108 (90 BASE) MCG/ACT IN AERS: 2.0000 | INHALATION_SPRAY | RESPIRATORY_TRACT | 10 refills | Status: AC | PRN

## 2023-09-06 MED ORDER — DULERA 200-5 MCG/ACT IN AERO: 2.0000 | INHALATION_SPRAY | Freq: Two times a day (BID) | RESPIRATORY_TRACT | 11 refills | Status: AC

## 2023-09-06 NOTE — Patient Instructions (Addendum)
 Continue DULERA  but lets try 2 puffs in AM only Start FLONASE  2 sprays each nostril daily Albuterol  as needed  Avoid Allergens and Irritants Avoid secondhand smoke Avoid SICK contacts Recommend  Masking  when appropriate Recommend Keep up-to-date with vaccinations  Incentive spirometry 1 times per day  Obtain CT chest to assess lung nodule  Recommend weight loss

## 2023-09-06 NOTE — Progress Notes (Unsigned)
 [facility] [place] Pulmonary Medicine Consultation     Date: 09/06/2023  MRN# [id number] Catherine Berg June 14, 1980   Catherine Berg is a 43 y.o. old female seen in consultation for chief complaint of:   CBC 09/14/18>>122 HST 07/05/18>> AHI of 1.4. **CT chest 03/31/2018>>  there is a small 4 mm or less nodule in the right lower lobe peripherally. No chief complaint on file.  CC Follow-up assessment for asthma/COPD   HPI:   Previous history and diagnosis of asthma with obesity PFTs February 2024 reviewed with patient in detail FEV1 62% predicted with a reduced ratio-findings consistent with moderate obstructive lung disease On her inhaled corticosteroid and long-acting beta agonist Dulera   Patient had extensive admission to the hospital in December 2020 for 06 May 2023 Patient underwent breast reduction surgery and subsequently developed severe breast infection was admitted for extensive IV antibiotic therapy Patient developed shortness of breath since then mostly described as inability to take deep breaths, signs of restrictive physiology No signs of infection at this time On exam patient is restrictive in her inspiratory capacity No wheezing at this time   No exacerbation at this time No evidence of heart failure at this time No evidence or signs of infection at this time No respiratory distress No fevers, chills, nausea, vomiting, diarrhea No evidence of lower extremity edema No evidence hemoptysis   Lab Results  Component Value Date   NITRICOXIDE 16 09/06/2023   Assessment of ASTHMA NOT  consistent with type II inflammation  -continue inhalers as prescribed -consider systemic steroids -consider CBC  -assess for Biological agents   Obesity current weight 222 pounds 5 feet 3 inches currently on Wegovy  Had lost 12 pounds  Regarding her lung nodule CT imaging reviewed with patient Patient would like repeat CT chest to assess right lung nodule Repeat CT chest May  2022 showed stable pulmonary nodules  Will repeat CT chest for further assessment   She does have a dog and allergic to dogs Not in the bedroom Has carpet in her bedroom      Social Hx:   Social History   Tobacco Use   Smoking status: Never   Smokeless tobacco: Never  Vaping Use   Vaping status: Never Used  Substance Use Topics   Alcohol use: No   Drug use: No   Medication:    Current Outpatient Medications:    amLODipine  (NORVASC ) 2.5 MG tablet, Take 1 tablet (2.5 mg total) by mouth daily., Disp: 90 tablet, Rfl: 0   bisacodyl  (DULCOLAX) 5 MG EC tablet, Take 1 tablet (5 mg total) by mouth daily as needed for moderate constipation., Disp: 30 tablet, Rfl: 1   busPIRone  (BUSPAR ) 5 MG tablet, Take 1 tablet (5 mg total) by mouth 2 (two) times daily., Disp: 180 tablet, Rfl: 0   cyclobenzaprine  (FLEXERIL ) 5 MG tablet, Take 1 tablet (5 mg total) by mouth at bedtime., Disp: 30 tablet, Rfl: 0   DULoxetine  (CYMBALTA ) 60 MG capsule, Take 1 capsule (60 mg total) by mouth daily., Disp: 90 capsule, Rfl: 0   ferrous sulfate  325 (65 FE) MG EC tablet, Take 1 tablet (325 mg total) by mouth 2 (two) times daily for 10 days., Disp: 20 tablet, Rfl: 0   GAVILYTE-N  WITH FLAVOR PACK 420 g solution, , Disp: , Rfl:    hydrochlorothiazide  (HYDRODIURIL ) 12.5 MG tablet, Take 1 tablet (12.5 mg total) by mouth daily., Disp: 90 tablet, Rfl: 0   levothyroxine  (SYNTHROID ) 100 MCG tablet, Take 100 mcg by mouth  daily before breakfast., Disp: , Rfl:    linaclotide  (LINZESS ) 145 MCG CAPS capsule, Take 1 capsule (145 mcg total) by mouth daily before breakfast., Disp: 90 capsule, Rfl: 1   metFORMIN  (GLUCOPHAGE ) 500 MG tablet, Take 1 tablet (500 mg total) by mouth daily with breakfast. (Patient taking differently: Take 500 mg by mouth daily with breakfast. Takes for long covid neuropathy), Disp: 90 tablet, Rfl: 3   mometasone -formoterol  (DULERA ) 200-5 MCG/ACT AERO, Inhale 2 puffs into the lungs in the morning and at  bedtime., Disp: 1 each, Rfl: 11   montelukast  (SINGULAIR ) 10 MG tablet, Take 1 tablet (10 mg total) by mouth at bedtime., Disp: 90 tablet, Rfl: 1   omeprazole  (PRILOSEC) 20 MG capsule, Take 1 capsule (20 mg total) by mouth 2 (two) times daily before a meal., Disp: 90 capsule, Rfl: 3   Semaglutide -Weight Management (WEGOVY ) 1 MG/0.5ML SOAJ, Inject 1 mg into the skin once a week., Disp: 2 mL, Rfl: 0   topiramate  (TOPAMAX ) 100 MG tablet, Take 1 tablet (100 mg total) by mouth at bedtime., Disp: 90 tablet, Rfl: 3   traZODone  (DESYREL ) 100 MG tablet, Take 1 tablet (100 mg total) by mouth at bedtime as needed for sleep., Disp: 30 tablet, Rfl: 1   VENTOLIN  HFA 108 (90 Base) MCG/ACT inhaler, INHALE 2 PUFFS BY MOUTH EVERY 6 HOURS AS NEEDED FOR WHEEZING OR SHORTNESS OF BREATH, Disp: 18 g, Rfl: 1   Vitamin D , Ergocalciferol , (DRISDOL ) 1.25 MG (50000 UNIT) CAPS capsule, Take 1 capsule (50,000 Units total) by mouth every 7 (seven) days., Disp: 12 capsule, Rfl: 1   Allergies:  Latex and Shellfish allergy   BP 122/88 (BP Location: Right Arm, Patient Position: Sitting, Cuff Size: Normal)   Pulse 82   Temp 98.8 F (37.1 C) (Oral)   Ht 5\' 3"  (1.6 m)   Wt 222 lb 3.2 oz (100.8 kg)   SpO2 98%   BMI 39.36 kg/m      Review of Systems: Gen:  Denies  fever, sweats, chills weight loss  HEENT: Denies blurred vision, double vision, ear pain, eye pain, hearing loss, nose bleeds, sore throat Cardiac:  No dizziness, chest pain or heaviness, chest tightness,edema, No JVD Resp:   No cough, -sputum production, +shortness of breath,-wheezing, -hemoptysis,  Other:  All other systems negative   Physical Examination:   General Appearance: No distress  EYES PERRLA, EOM intact.   NECK Supple, No JVD Pulmonary: normal breath sounds, No wheezing.  CardiovascularNormal S1,S2.  No m/r/g.   Abdomen: Benign, Soft, non-tender. Neurology UE/LE 5/5 strength, no focal deficits Ext pulses intact, cap refill intact ALL  OTHER ROS ARE NEGATIVE     Assessment and Plan:  43 year old pleasant African-American female with a history of asthma/COPD and obesity with previous diagnosis of COVID-19 infection  At this time her asthma COPD is stable however she has increased shortness of breath due to restrictive physiology due to breast and chest wall infection Her inspiratory capacity is diminished over the last several months Recommend incentive spirometry and increase exercise capacity  Asthma COPD Will obtain pulmonary function test at next office visit Recommend incentive spirometry 10-15 times per day Continue inhalers as prescribed with Dulera  Consider decreasing Dulera  to 2 puffs once daily Avoid Allergens and Irritants Avoid secondhand smoke Avoid SICK contacts Recommend  Masking  when appropriate Recommend Keep up-to-date with vaccinations Continue to use albuterol  as needed No exacerbation at this time No evidence of heart failure at this time No evidence or signs  of infection at this time No respiratory distress No fevers, chills, nausea, vomiting, diarrhea No evidence of lower extremity edema No evidence hemoptysis Pulmonary function test reviewed with patient in detail  FEV1 62% predicted  Signifies moderate obstructive lung disease  Continue inhalers as prescribed with Dulera  and albuterol  as needed Will repeat PFTs and do reassessment at next office visit   Previous history of lung nodule lung nodule. It has been deemed benign 4 mm nodule in the right lower lobe incidentally found in CT chest in 2018 repeat chest in 2019 did not show any interval changes therefore it was deemed necessary not to follow-up with CT scans but will discuss with patient Repeat CT chest to assess lung nodule  Respiratory insufficiency Recommend incentive spirometry  Allergic rhinitis Recommend Flonase   Continue current weight loss journey Patient currently on Wegovy     MEDICATION ADJUSTMENTS/LABS AND  TESTS ORDERED: Continue DULERA  but lets try 2 puffs in AM only Start FLONASE  2 sprays each nostril daily Albuterol  as needed Avoid Allergens and Irritants Avoid secondhand smoke Avoid SICK contacts Recommend  Masking  when appropriate Recommend Keep up-to-date with vaccinations Incentive spirometry 1 times per day Obtain CT chest to assess lung nodule Recommend weight loss AVOID POLLEN, SMOKE, DUST, AVOID DOGS as much as you can   CURRENT MEDICATIONS REVIEWED AT LENGTH WITH PATIENT TODAY   Patient  satisfied with Plan of action and management. All questions answered  Follow up in 3 months  Total time spent 42 minutes  Lady Pier, M.D.  [facility]  Medical Director [facility] Medical Director [facility] Cardio-Pulmonary Department

## 2023-09-07 ENCOUNTER — Encounter: Payer: Self-pay | Admitting: Internal Medicine

## 2023-09-07 NOTE — Addendum Note (Signed)
 Addended by: Maycol Hoying J on: 09/07/2023 12:40 PM   Modules accepted: Orders

## 2023-09-08 NOTE — Addendum Note (Signed)
 Addended by: Tynell Winchell J on: 09/08/2023 02:22 PM   Modules accepted: Orders
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
## 2023-09-13 ENCOUNTER — Telehealth: Admitting: Physician Assistant

## 2023-09-13 DIAGNOSIS — R3989 Other symptoms and signs involving the genitourinary system: Secondary | ICD-10-CM

## 2023-09-13 MED ORDER — CEPHALEXIN 500 MG PO CAPS: 500.0000 mg | ORAL_CAPSULE | Freq: Two times a day (BID) | ORAL | 0 refills | Status: AC

## 2023-09-13 NOTE — Progress Notes (Signed)
 I have spent 5 minutes in review of e-visit questionnaire, review and updating patient chart, medical decision making and response to patient.   Piedad Climes, PA-C

## 2023-09-13 NOTE — Progress Notes (Signed)

## 2023-09-14 ENCOUNTER — Other Ambulatory Visit: Payer: Self-pay

## 2023-09-14 ENCOUNTER — Ambulatory Visit (INDEPENDENT_AMBULATORY_CARE_PROVIDER_SITE_OTHER): Admitting: Psychiatry

## 2023-09-14 ENCOUNTER — Encounter: Payer: Self-pay | Admitting: Psychiatry

## 2023-09-14 VITALS — BP 132/86 | HR 86 | Temp 97.7°F | Ht 63.0 in | Wt 218.2 lb

## 2023-09-14 DIAGNOSIS — F4310 Post-traumatic stress disorder, unspecified: Secondary | ICD-10-CM | POA: Diagnosis not present

## 2023-09-14 DIAGNOSIS — F411 Generalized anxiety disorder: Secondary | ICD-10-CM | POA: Diagnosis not present

## 2023-09-14 DIAGNOSIS — F330 Major depressive disorder, recurrent, mild: Secondary | ICD-10-CM

## 2023-09-14 DIAGNOSIS — G4700 Insomnia, unspecified: Secondary | ICD-10-CM | POA: Diagnosis not present

## 2023-09-14 MED ORDER — VENLAFAXINE HCL ER 37.5 MG PO CP24: ORAL_CAPSULE | ORAL | 0 refills | Status: DC

## 2023-09-14 MED ORDER — TRAZODONE HCL 100 MG PO TABS: 100.0000 mg | ORAL_TABLET | Freq: Every evening | ORAL | 0 refills | Status: DC | PRN

## 2023-09-14 MED ORDER — VENLAFAXINE HCL ER 150 MG PO CP24
150.0000 mg | ORAL_CAPSULE | Freq: Every day | ORAL | 1 refills | Status: DC
Start: 2023-09-28 — End: 2023-11-02

## 2023-09-14 MED ORDER — LACTULOSE 10 GM/15ML PO SOLN: 20.0000 g | Freq: Two times a day (BID) | ORAL | 0 refills | Status: AC | PRN

## 2023-09-14 NOTE — Addendum Note (Signed)
 Addended by: Lovie Rudder on: 09/14/2023 08:02 AM   Modules accepted: Orders

## 2023-09-14 NOTE — Patient Instructions (Signed)
 Decrease duloxetine  30 mg for one week,then discontinue  Start venlafaxine  37.5 mg daily for one week, then 75 mg daily for one week, then 150 mg daily   Continue buspar  5 mg twice a day  Continue trazodone  100 mg at night as needed for insomnia Continue hydroxyzine  10 mg daily as needed for anxiety  Next appointment: 7/1 at 4:30

## 2023-09-28 ENCOUNTER — Encounter: Payer: Self-pay | Admitting: Family Medicine

## 2023-09-28 ENCOUNTER — Other Ambulatory Visit: Payer: Self-pay | Admitting: Family Medicine

## 2023-09-28 ENCOUNTER — Other Ambulatory Visit (HOSPITAL_COMMUNITY): Payer: Self-pay

## 2023-09-28 MED ORDER — WEGOVY 1.7 MG/0.75ML ~~LOC~~ SOAJ: 1.7000 mg | SUBCUTANEOUS | 0 refills | Status: DC

## 2023-09-29 ENCOUNTER — Other Ambulatory Visit (HOSPITAL_COMMUNITY): Payer: Self-pay

## 2023-09-30 ENCOUNTER — Encounter: Payer: Self-pay | Admitting: Family Medicine

## 2023-09-30 ENCOUNTER — Other Ambulatory Visit (HOSPITAL_COMMUNITY): Payer: Self-pay

## 2023-10-01 ENCOUNTER — Other Ambulatory Visit (HOSPITAL_COMMUNITY): Payer: Self-pay

## 2023-10-01 ENCOUNTER — Telehealth: Payer: Self-pay | Admitting: Pharmacy Technician

## 2023-10-01 NOTE — Telephone Encounter (Signed)
 Pharmacy Patient Advocate Encounter   Received notification from CoverMyMeds that prior authorization for Wegovy  1.7MG /0.75ML auto-injectors is required/requested.   Insurance verification completed.   The patient is insured through [employer] .   Per test claim: PA required; PA submitted to above mentioned insurance via CoverMyMeds Key/confirmation #/EOC [id number] Status is pending

## 2023-10-04 ENCOUNTER — Other Ambulatory Visit (HOSPITAL_COMMUNITY): Payer: Self-pay

## 2023-10-04 ENCOUNTER — Ambulatory Visit (INDEPENDENT_AMBULATORY_CARE_PROVIDER_SITE_OTHER): Admitting: Licensed Clinical Social Worker

## 2023-10-04 DIAGNOSIS — F411 Generalized anxiety disorder: Secondary | ICD-10-CM

## 2023-10-04 DIAGNOSIS — F330 Major depressive disorder, recurrent, mild: Secondary | ICD-10-CM | POA: Diagnosis not present

## 2023-10-04 DIAGNOSIS — F4310 Post-traumatic stress disorder, unspecified: Secondary | ICD-10-CM | POA: Diagnosis not present

## 2023-10-04 NOTE — Progress Notes (Signed)
   THERAPIST PROGRESS NOTE  Session Time: 3:05-4:05pm  Participation Level: Active  Behavioral Response: CasualAlertDepressed  Type of Therapy: Individual Therapy  Treatment Goals addressed: LTG: Reduce frequency, intensity, and duration of depression symptoms so that daily functioning is improved    STG: Azia will identify cognitive patterns and beliefs that support depression    LTG: "I want to be where the things I don't have control over, I can learn to let go."      ProgressTowards Goals: Progressing  Interventions: CBT, Assertiveness Training, Supportive, and Reframing  Summary: Catherine Berg is a 43 y.o. female who presents with anxiety. Patient identifies symptoms to include uncontrollable worry, negative self affect, low mood, anxious feelings, tearfulness, avoidance, restlessness. Pt was oriented times 5. Pt was cooperative and engaged. Pt denies SI/HI/AVH.    The patient participated in a therapeutic session to address her current symptoms. She reported feeling uncomfortable around people because she feels the need to pretend and has developed patterns of avoidance. She acknowledged that she has often been isolating herself. The patient mentioned experiencing episodes where her mind feels as if it is working in slow motion about three times a week. She struggled to identify specific triggers or "warning signs."  The clinician provided brief psychoeducation on dissociation, and the patient resonated with the symptoms discussed. Additionally, the patient reported difficulty sleeping due to distressing dreams. While these dreams do not reach the level of nightmares, they still disrupt her sleep, and she cannot recall their content.  The patient also indicated a change in her medications but denied any improvement regarding her stress levels. She continues to feel overwhelmed by thoughts of dying.  To cope, the patient has been walking and listening to music but still  experiences a lack of motivation.   The clinician and the patient discussed current stressors, including the need to establish healthy boundaries with her relatives. The patient expressed difficulty in saying no to her family. Together, they worked to develop and role-play assertive communication strategies that would help her reaffirm her role as a sister rather than take on a maternal role within the family.   Suicidal/Homicidal: Nowithout intent/plan  Therapist Response: Cln utilized active and supportive reflection to create a safe environment for patient to process recent life stressors. Clinician assessed for current symptoms, stressors, safety since last session.  Continue to explore ways in which patient can establish healthy boundaries with family as well as prioritize her wellbeing.  Plan: Return again in 3 weeks.  Diagnosis: PTSD (post-traumatic stress disorder)  MDD (major depressive disorder), recurrent episode, mild (HCC)  GAD (generalized anxiety disorder)   Collaboration of Care: AEB psychiatrist can access notes and cln. Will review psychiatrists' notes. Check in with the patient and will see LCSW per availability. Patient agreed with treatment recommendations.   Patient/Guardian was advised Release of Information must be obtained prior to any record release in order to collaborate their care with an outside provider. Patient/Guardian was advised if they have not already done so to contact the registration department to sign all necessary forms in order for us  to release information regarding their care.   Consent: Patient/Guardian gives verbal consent for treatment and assignment of benefits for services provided during this visit. Patient/Guardian expressed understanding and agreed to proceed.   Marvin Slot, LCSW 10/04/2023

## 2023-10-04 NOTE — Telephone Encounter (Signed)
 Pharmacy Patient Advocate Encounter  Received notification from [place] [employer]ICA[employer] that Prior Authorization for Wegovy  1.7MG /0.75ML auto-injectors has been APPROVED   PA #/Case ID/Reference #: [id number]    The official approval letter will be uploaded to pt's media tab once available

## 2023-10-05 ENCOUNTER — Other Ambulatory Visit (HOSPITAL_COMMUNITY): Payer: Self-pay

## 2023-10-05 NOTE — Telephone Encounter (Signed)
 Good morning this was approved please see encounter 10/01/23

## 2023-10-12 ENCOUNTER — Ambulatory Visit: Admitting: Physical Medicine and Rehabilitation

## 2023-10-13 ENCOUNTER — Ambulatory Visit (INDEPENDENT_AMBULATORY_CARE_PROVIDER_SITE_OTHER): Admitting: Family Medicine

## 2023-10-13 DIAGNOSIS — G9332 Myalgic encephalomyelitis/chronic fatigue syndrome: Secondary | ICD-10-CM

## 2023-10-13 DIAGNOSIS — U099 Post covid-19 condition, unspecified: Secondary | ICD-10-CM

## 2023-10-13 DIAGNOSIS — R300 Dysuria: Secondary | ICD-10-CM | POA: Diagnosis not present

## 2023-10-13 DIAGNOSIS — E039 Hypothyroidism, unspecified: Secondary | ICD-10-CM

## 2023-10-13 DIAGNOSIS — I10 Essential (primary) hypertension: Secondary | ICD-10-CM

## 2023-10-13 DIAGNOSIS — J449 Chronic obstructive pulmonary disease, unspecified: Secondary | ICD-10-CM | POA: Insufficient documentation

## 2023-10-13 DIAGNOSIS — G5793 Unspecified mononeuropathy of bilateral lower limbs: Secondary | ICD-10-CM

## 2023-10-13 DIAGNOSIS — N898 Other specified noninflammatory disorders of vagina: Secondary | ICD-10-CM

## 2023-10-13 DIAGNOSIS — J4489 Other specified chronic obstructive pulmonary disease: Secondary | ICD-10-CM | POA: Diagnosis not present

## 2023-10-13 LAB — POCT URINALYSIS DIPSTICK
Bilirubin, UA: NEGATIVE
Blood, UA: NEGATIVE
Glucose, UA: NEGATIVE
Ketones, UA: NEGATIVE
Leukocytes, UA: NEGATIVE
Nitrite, UA: NEGATIVE
Odor: NORMAL
Protein, UA: NEGATIVE
Spec Grav, UA: 1.01 (ref 1.010–1.025)
Urobilinogen, UA: 1 U/dL
pH, UA: 6 (ref 5.0–8.0)

## 2023-10-13 MED ORDER — AMLODIPINE BESYLATE 2.5 MG PO TABS: 2.5000 mg | ORAL_TABLET | Freq: Every day | ORAL | 0 refills | Status: DC

## 2023-10-13 MED ORDER — WEGOVY 2.4 MG/0.75ML ~~LOC~~ SOAJ: 2.4000 mg | SUBCUTANEOUS | 0 refills | Status: DC

## 2023-10-13 MED ORDER — HYDROCHLOROTHIAZIDE 12.5 MG PO TABS: 12.5000 mg | ORAL_TABLET | Freq: Every day | ORAL | 0 refills | Status: DC

## 2023-10-13 NOTE — Progress Notes (Signed)
 Name: Catherine Berg   MRN: [id number]    DOB: [birth year]   Date:10/13/2023       Progress Note  Subjective  Chief Complaint  Chief Complaint  Patient presents with   Medical Management of Chronic Issues   Discussed the use of AI scribe software for clinical note transcription with the patient, who gave verbal consent to proceed.  History of Present Illness Catherine Berg is a 43 year old female with obesity, hypertension, and post-COVID neuropathy who presents for a follow-up on weight management with Wegovy .  She started Wegovy  at 0.25 mg in March 2025, now increased to 1.7 mg, resulting in a 28-pound weight loss from 234 lbs to 206 lbs. She experiences mild fatigue as a side effect but no nausea or vomiting. She walks two to three times a week  Her hypertension is managed with amlodipine  and HCTZ. She previously had lower extremity swelling, which improved after medication adjustment. She does not regularly monitor her blood pressure at home.  She has post-COVID neuropathy and takes metformin , prescribed by her neurologist. She experiences chronic fatigue and shortness of breath. She is under the care of a pulmonologist for COPD and asthma, using Dulera , Singulair , and a rescue inhaler as needed. She reports occasional shortness of breath without a clear trigger.  Her hypothyroidism is managed with levothyroxine  112 mcg daily, prescribed by Dr. Shelvy Berg.   She has a history of cellulitis of the breast post-surgery but is now doing well   She reports a new onset of vaginal discharge and irritation following a recent implant change. The discharge is clear and darkish, with no significant bleeding, and she experiences dysuria.    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   Vitamin D  deficiency 04/22/2020   COVID-19 long hauler manifesting chronic fatigue 04/22/2020   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   Memory loss 02/28/2020   Physical deconditioning 02/28/2020   Dyspnea on exertion 02/28/2020   Neck pain, bilateral 02/28/2020   Muscle spasms of neck 02/28/2020   Arthralgia of hand 01/24/2019   Class 3 severe obesity due to excess calories without serious comorbidity in adult 01/24/2019   Palpitations 09/16/2018   Coronary artery calcification 09/16/2018   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   Bilateral leg edema 04/20/2017   Incidental lung nodule, > 3mm and < 8mm 03/31/2017   Opioid-induced constipation 11/07/2015   Hives 09/02/2015   Obesity, Class III, BMI 40-49.9 (morbid obesity) 08/15/2015   Anxiety 07/09/2015   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 06/28/2015   Thyroid  nodule 02/01/2015   History of abnormal cervical Pap smear    Allergy-induced asthma, mild intermittent, uncomplicated 10/09/2013    Past Surgical History:  Procedure Laterality Date   BREAST REDUCTION SURGERY Bilateral 04/13/2023   Procedure: MAMMARY REDUCTION  (BREAST);  Surgeon: Teretha Ferguson, MD;  Location: [facility];  Service: Plastics;  Laterality: Bilateral;   BREAST SURGERY     CERVICAL POLYPECTOMY     CESAREAN  SECTION     x 2   CHOLECYSTECTOMY     COLONOSCOPY WITH PROPOFOL  N/A 11/14/2018   Procedure: COLONOSCOPY WITH PROPOFOL ;  Surgeon: Irby Mannan, MD;  Location: [facility];  Service: Endoscopy;  Laterality: N/A;   ESOPHAGOGASTRODUODENOSCOPY (EGD) WITH PROPOFOL  N/A 11/14/2018    Procedure: ESOPHAGOGASTRODUODENOSCOPY (EGD) WITH PROPOFOL ;  Surgeon: Irby Mannan, MD;  Location: [facility];  Service: Endoscopy;  Laterality: N/A;  Latex allergy   GIVENS CAPSULE STUDY N/A 12/27/2018   Procedure: GIVENS CAPSULE STUDY;  Surgeon: Irby Mannan, MD;  Location: [facility];  Service: Endoscopy;  Laterality: N/A;   INCISION AND DRAINAGE OF WOUND Left 05/03/2023   Procedure: IRRIGATION AND DEBRIDEMENT WOUND;  Surgeon: Teretha Ferguson, MD;  Location: [facility];  Service: Plastics;  Laterality: Left;    Family History  Problem Relation Age of Onset   Heart attack Mother 63   Hypertension Mother    Asthma Mother    Cancer Mother        laryngeal   Diabetes Mother        pre diabetic   Heart disease Mother    Mental illness Mother    Alcohol abuse Mother    Drug abuse Mother    Depression Mother    Anxiety disorder Mother    Bipolar disorder Mother    Learning disabilities Brother    ADD / ADHD Brother    Asthma Son    Hyperlipidemia Brother    Alcohol abuse Father    Heart disease Maternal Grandmother        heart attack, pacemaker   Heart attack Maternal Grandmother    Hypertension Maternal Grandmother    Hypertension Maternal Grandfather    Heart disease Paternal Grandmother        couple of major open heart surgeries, leaking valves   Hypertension Paternal Grandmother    Hypertension Paternal Grandfather    Breast cancer Neg Hx     Social History   Tobacco Use   Smoking status: Never   Smokeless tobacco: Never  Substance Use Topics   Alcohol use: No     Current Outpatient Medications:    amLODipine  (NORVASC ) 2.5 MG tablet, Take 1 tablet (2.5 mg total) by mouth daily., Disp: 90 tablet, Rfl: 0   bisacodyl  (DULCOLAX) 5 MG EC tablet, Take 1 tablet (5 mg total) by mouth daily as needed for moderate constipation., Disp: 30 tablet, Rfl: 1   busPIRone  (BUSPAR ) 5 MG tablet, Take 1 tablet (5 mg total) by mouth 2 (two) times daily.,  Disp: 180 tablet, Rfl: 0   fluticasone  (FLONASE ) 50 MCG/ACT nasal spray, Place 2 sprays into both nostrils daily., Disp: 1 g, Rfl: 5   hydrochlorothiazide  (HYDRODIURIL ) 12.5 MG tablet, Take 1 tablet (12.5 mg total) by mouth daily., Disp: 90 tablet, Rfl: 0   lactulose  (CHRONULAC ) 10 GM/15ML solution, Take 30 mLs (20 g total) by mouth 2 (two) times daily as needed for moderate constipation., Disp: 473 mL, Rfl: 0   levothyroxine  (SYNTHROID ) 100 MCG tablet, Take 100 mcg by mouth daily before breakfast., Disp: , Rfl:    linaclotide  (LINZESS ) 145 MCG CAPS capsule, Take 1 capsule (145 mcg total) by mouth daily before breakfast., Disp: 90 capsule, Rfl: 1   metFORMIN  (GLUCOPHAGE ) 500 MG tablet, Take 1 tablet (500 mg total) by mouth daily with breakfast. (Patient taking differently: Take 500 mg by mouth daily with breakfast. Takes for long covid neuropathy), Disp: 90 tablet, Rfl: 3   mometasone -formoterol  (  DULERA ) 200-5 MCG/ACT AERO, Inhale 2 puffs into the lungs in the morning and at bedtime., Disp: 1 each, Rfl: 11   montelukast  (SINGULAIR ) 10 MG tablet, Take 1 tablet (10 mg total) by mouth at bedtime., Disp: 90 tablet, Rfl: 1   omeprazole  (PRILOSEC) 20 MG capsule, Take 1 capsule (20 mg total) by mouth 2 (two) times daily before a meal., Disp: 90 capsule, Rfl: 3   Semaglutide -Weight Management (WEGOVY ) 1.7 MG/0.75ML SOAJ, Inject 1.7 mg into the skin once a week., Disp: 3 mL, Rfl: 0   topiramate  (TOPAMAX ) 100 MG tablet, Take 1 tablet (100 mg total) by mouth at bedtime., Disp: 90 tablet, Rfl: 3   traZODone  (DESYREL ) 100 MG tablet, Take 1 tablet (100 mg total) by mouth at bedtime as needed for sleep., Disp: 90 tablet, Rfl: 0   venlafaxine  XR (EFFEXOR -XR) 150 MG 24 hr capsule, Take 1 capsule (150 mg total) by mouth daily with breakfast. Start after completing 75 mg daily for one week, Disp: 30 capsule, Rfl: 1   VENTOLIN  HFA 108 (90 Base) MCG/ACT inhaler, Inhale 2 puffs into the lungs every 4 (four) hours as needed  for wheezing or shortness of breath., Disp: 18 g, Rfl: 10   Vitamin D , Ergocalciferol , (DRISDOL ) 1.25 MG (50000 UNIT) CAPS capsule, Take 1 capsule (50,000 Units total) by mouth every 7 (seven) days., Disp: 12 capsule, Rfl: 1   cyclobenzaprine  (FLEXERIL ) 5 MG tablet, Take 1 tablet (5 mg total) by mouth at bedtime. (Patient not taking: Reported on 10/13/2023), Disp: 30 tablet, Rfl: 0   DULoxetine  (CYMBALTA ) 60 MG capsule, Take 1 capsule (60 mg total) by mouth daily. (Patient not taking: Reported on 10/13/2023), Disp: 90 capsule, Rfl: 0   ferrous sulfate  325 (65 FE) MG EC tablet, Take 1 tablet (325 mg total) by mouth 2 (two) times daily for 10 days., Disp: 20 tablet, Rfl: 0   GAVILYTE-N  WITH FLAVOR PACK 420 g solution, , Disp: , Rfl:    venlafaxine  XR (EFFEXOR -XR) 37.5 MG 24 hr capsule, Take 1 capsule (37.5 mg total) by mouth daily with breakfast for 7 days, THEN 2 capsules (75 mg total) daily with breakfast for 7 days., Disp: 21 capsule, Rfl: 0  Allergies  Allergen Reactions   Latex Hives    (Balloons, condoms, underwear elastic, gloves)   Shellfish Allergy Hives    I personally reviewed active problem list, medication list, allergies, family history with the patient/caregiver today.   ROS  Ten systems reviewed and is negative except as mentioned in HPI    Objective Physical Exam Constitutional: Patient appears well-developed and well-nourished. Obese  No distress.  HEENT: head atraumatic, normocephalic, pupils equal and reactive to light, neck supple Cardiovascular: Normal rate, regular rhythm and normal heart sounds.  No murmur heard. No BLE edema. Pulmonary/Chest: Effort normal and breath sounds normal. No respiratory distress. Abdominal: Soft.  There is no tenderness. Psychiatric: Patient has a normal mood and affect. behavior is normal. Judgment and thought content normal.     Vitals:   10/13/23 1039  BP: 124/70  Pulse: 83  Temp: 98.1 F (36.7 C)  TempSrc: Oral  SpO2: 100%   Weight: 206 lb 14.4 oz (93.8 kg)  Height: 5' 3 (1.6 m)    Body mass index is 36.65 kg/m.  Recent Results (from the past 2160 hours)  CBC with Differential     Status: Abnormal   Collection Time: 08/23/23  3:43 PM  Result Value Ref Range   WBC 6.5 3.4 - 10.8 x10E3/uL  RBC 4.71 3.77 - 5.28 x10E6/uL   Hemoglobin 12.5 11.1 - 15.9 g/dL   Hematocrit 29.5 62.1 - 46.6 %   MCV 83 79 - 97 fL   MCH 26.5 (L) 26.6 - 33.0 pg   MCHC 32.1 31.5 - 35.7 g/dL   RDW 30.8 65.7 - 84.6 %   Platelets 356 150 - 450 x10E3/uL   Neutrophils 35 Not Estab. %   Lymphs 56 Not Estab. %   Monocytes 7 Not Estab. %   Eos 1 Not Estab. %   Basos 1 Not Estab. %   Neutrophils Absolute 2.3 1.4 - 7.0 x10E3/uL   Lymphocytes Absolute 3.7 (H) 0.7 - 3.1 x10E3/uL   Monocytes Absolute 0.4 0.1 - 0.9 x10E3/uL   EOS (ABSOLUTE) 0.1 0.0 - 0.4 x10E3/uL   Basophils Absolute 0.0 0.0 - 0.2 x10E3/uL   Immature Granulocytes 0 Not Estab. %   Immature Grans (Abs) 0.0 0.0 - 0.1 x10E3/uL  Fe+TIBC+Fer     Status: Abnormal   Collection Time: 08/23/23  3:43 PM  Result Value Ref Range   Total Iron Binding Capacity 338 250 - 450 ug/dL   UIBC 962 952 - 841 ug/dL   Iron 40 27 - 324 ug/dL   Iron Saturation 12 (L) 15 - 55 %   Ferritin 41 15 - 150 ng/mL  B12 and Folate Panel     Status: None   Collection Time: 08/23/23  3:43 PM  Result Value Ref Range   Vitamin B-12 248 232 - 1,245 pg/mL   Folate 6.7 >3.0 ng/mL    Comment: A serum folate concentration of less than 3.1 ng/mL is considered to represent clinical deficiency.   H. pylori breath test     Status: None   Collection Time: 08/23/23  3:43 PM  Result Value Ref Range   H pylori Breath Test Negative Negative  Nitric oxide      Status: None   Collection Time: 09/06/23  3:13 PM  Result Value Ref Range   Nitric Oxide  16     PHQ2/9:    10/13/2023   10:43 AM 07/27/2023    3:11 PM 07/14/2023    9:04 AM 07/12/2023   10:40 AM 05/27/2023    4:18 PM  Depression screen PHQ 2/9   Decreased Interest 0  1 1   Down, Depressed, Hopeless 0  1 1   PHQ - 2 Score 0  2 2   Altered sleeping 0  1    Tired, decreased energy 0  1    Change in appetite 0  1    Feeling bad or failure about yourself  0  0    Trouble concentrating 0  1    Moving slowly or fidgety/restless 0  0    Suicidal thoughts 0  0    PHQ-9 Score 0  6    Difficult doing work/chores Not difficult at all  Very difficult       Information is confidential and restricted. Go to Review Flowsheets to unlock data.    phq 9 is negative  Fall Risk:    10/13/2023   10:43 AM 07/28/2023    9:41 AM 07/12/2023   10:40 AM 05/21/2023    1:07 PM 04/29/2023    9:46 AM  Fall Risk   Falls in the past year? 0 0 0 0 0  Number falls in past yr: 0 0 0 0   Injury with Fall? 0 0 0 0   Risk for fall due to :  No Fall Risks   No Fall Risks No Fall Risks  Follow up Falls evaluation completed Falls evaluation completed  Falls prevention discussed;Education provided;Falls evaluation completed Falls prevention discussed      Assessment & Plan COPD with Asthma COPD and asthma exacerbated by secondhand smoke. Variable shortness of breath. Current treatment includes Dulera , Singulair , and rescue inhaler. Pulmonologist considering retesting. - Continue Dulera , Singulair , and rescue inhaler. - Follow up with pulmonologist for potential retesting.  Obesity BMI over 35, morbid obesity due to BMI over 35 and comorbidities such as HTN, COPD and depression Lost 28 lbs since March. Wegovy  and metformin  aiding weight loss. Target weight 160 lbs. Considering increasing Wegovy  dose to 2.4 mg. - Continue Wegovy , increase dose from 1.7 mg to 2.4 mg for three months. - Continue metformin . - Encourage walking and physical activity as tolerated.  Hypertension Hypertension well-controlled. Blood pressure 124/70 mmHg. Previous swelling improved with medication adjustment. - Continue amlodipine  and HCTZ. - Encourage regular home blood pressure  monitoring.  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 On metformin . Improvement possible with weight loss. Discuss dose adjustment if neuropathy improves. - Continue metformin . - Follow up with neurologist.  Chronic Fatigue Syndrome Fatigue likely related to post-COVID syndrome. Weight loss and activity may help. - Encourage weight loss and physical activity as tolerated.  Vaginal Discharge Clear, darkish discharge with cramping, possibly related to Nexplanon  change. Differential includes bacterial vaginosis, yeast infection, or other causes. - Perform self-swab for bacterial vaginosis, yeast, trichomonas, and chlamydia. - Perform urine culture for possible UTI.  Hypothyroidism On levothyroxine  112 mcg daily. Recent thyroid  levels slightly above normal. - Continue levothyroxine  112 mcg daily. - Follow up with Dr. Shelvy Berg.  Depression On venlafaxine , buspirone , and trazodone . Recent medication adjustment from duloxetine  to venlafaxine . - Continue venlafaxine , buspirone , and trazodone . - Follow up with psychiatrist.

## 2023-10-14 ENCOUNTER — Ambulatory Visit: Admitting: Family Medicine

## 2023-10-14 LAB — CERVICOVAGINAL ANCILLARY ONLY
Bacterial Vaginitis (gardnerella): NEGATIVE
Candida Glabrata: POSITIVE — AB
Candida Vaginitis: NEGATIVE
Chlamydia: NEGATIVE
Comment: NEGATIVE
Comment: NEGATIVE
Comment: NEGATIVE
Comment: NEGATIVE
Comment: NEGATIVE
Comment: NORMAL
Neisseria Gonorrhea: NEGATIVE
Trichomonas: NEGATIVE

## 2023-10-14 LAB — CULTURE, URINE COMPREHENSIVE
MICRO NUMBER:: 16568182
SPECIMEN QUALITY:: ADEQUATE

## 2023-10-15 ENCOUNTER — Encounter: Attending: Physical Medicine and Rehabilitation | Admitting: Physical Medicine and Rehabilitation

## 2023-10-15 ENCOUNTER — Encounter: Payer: Self-pay | Admitting: Physical Medicine and Rehabilitation

## 2023-10-15 ENCOUNTER — Ambulatory Visit: Payer: Self-pay | Admitting: Family Medicine

## 2023-10-15 ENCOUNTER — Other Ambulatory Visit: Payer: Self-pay | Admitting: Family Medicine

## 2023-10-15 VITALS — BP 129/82 | HR 69 | Ht 63.0 in | Wt 207.2 lb

## 2023-10-15 DIAGNOSIS — G629 Polyneuropathy, unspecified: Secondary | ICD-10-CM | POA: Diagnosis present

## 2023-10-15 MED ORDER — FLUCONAZOLE 150 MG PO TABS: 150.0000 mg | ORAL_TABLET | ORAL | 0 refills | Status: AC

## 2023-10-15 MED ORDER — CAPSAICIN-CLEANSING GEL 8 % EX KIT
4.0000 | PACK | Freq: Once | CUTANEOUS | Status: AC
Start: 2023-10-15 — End: 2023-10-15
  Administered 2023-10-15: 4 via TOPICAL

## 2023-10-15 NOTE — Progress Notes (Unsigned)
qute

## 2023-10-20 ENCOUNTER — Encounter: Payer: Self-pay | Admitting: Family Medicine

## 2023-10-21 ENCOUNTER — Telehealth: Payer: Self-pay | Admitting: Family Medicine

## 2023-10-21 NOTE — Telephone Encounter (Signed)
 Pt wants to check if nexplanon  is still in same place

## 2023-10-22 ENCOUNTER — Ambulatory Visit: Admitting: Nurse Practitioner

## 2023-10-25 ENCOUNTER — Encounter: Admitting: Nurse Practitioner

## 2023-10-25 ENCOUNTER — Encounter: Payer: Self-pay | Admitting: Family Medicine

## 2023-10-25 ENCOUNTER — Ambulatory Visit

## 2023-10-26 ENCOUNTER — Ambulatory Visit (INDEPENDENT_AMBULATORY_CARE_PROVIDER_SITE_OTHER): Admitting: Licensed Clinical Social Worker

## 2023-10-26 DIAGNOSIS — F330 Major depressive disorder, recurrent, mild: Secondary | ICD-10-CM | POA: Diagnosis not present

## 2023-10-26 DIAGNOSIS — F411 Generalized anxiety disorder: Secondary | ICD-10-CM

## 2023-10-26 DIAGNOSIS — F4310 Post-traumatic stress disorder, unspecified: Secondary | ICD-10-CM | POA: Diagnosis not present

## 2023-10-26 NOTE — Progress Notes (Signed)
 THERAPIST PROGRESS NOTE  Progress Review   Session Time: 4:05pm-5:02pm  Participation Level: Active  Behavioral Response: CasualAlertEuthymic  Type of Therapy: Individual Therapy  Treatment Goals addressed:   Active     BH CCP Acute or Chronic Trauma Reaction     LTG: Recall traumatic events without becoming overwhelmed with negative emotions (Progressing)     Start:  03/17/23    Expected End:  01/26/24       Goal Note     10/26/23: Pt reports If my mind does not take me to a place that I don't want to go to then I don't have any symptoms.          LTG: Pt identifies she would like to process her trauma hx (Progressing)     Start:  03/17/23    Expected End:  01/26/24       Goal Note     10/26/23: Pt reports          LTG: I want to be where the things I don't have control over, I can learn to let go.  (Progressing)     Start:  03/17/23    Expected End:  01/26/24       Goal Note     10/26/23: Pt reports          STG: Iris will identify internal and external stimuli that trigger PTSD symptoms (Progressing)     Start:  03/17/23    Expected End:  01/26/24       Goal Note     10/26/23: Pt reports          Provide Correen with education on trauma-oriented therapy     Start:  03/17/23         Educate Adrianna on common reactions to a traumatic experience     Start:  03/17/23         Educate Dreyah that exposure to trauma may result in brain and hormonal changes that can lead to difficulties with memory, learning, emotional regulation, poor impulse control, or depression that can persist     Start:  03/17/23         Encourage Conny to identify 3 trauma related cognitive distortions     Start:  03/17/23         Coping Skills      Start:  03/17/23      Will Work with patient to decrease the frequency of negative self-descriptive statements and increase the frequency of positive self- descriptive statements using CBT/DBT/REBT techniques per  patient self report 3 out of 5 documented sessions. Some of the techniques that will be used will be CBT, positive affirmations, role playing, modeling, homework and journaling.          OP Depression     LTG: Reduce frequency, intensity, and duration of depression symptoms so that daily functioning is improved (Progressing)     Start:  03/17/23    Expected End:  01/26/24       Goal Note     10/26/23: Pt reports It's not as often. Sometimes I wonder about the severity but I guess here lately I can get myself out of that place before getting there.          STG: Angee will identify cognitive patterns and beliefs that support depression (Progressing)     Start:  03/17/23    Expected End:  01/26/24       Goal Note     10/26/23: Pt  reports Sometimes I know what some triggers are and some thoughts it is hard to get rid of those thoughts.         LTG: I just want to believe that I am okay to keep pushing and thriving. Not allow things to have control over you. (Progressing)     Start:  03/17/23    Expected End:  01/26/24       Goal Note     10/26/23: Pt reports I feel there are still things that I have control over me even though I am always working to improve. I am trying my best but I don't win everyday.         Work with [name] to identify the major components of a recent episode of depression: physical symptoms, major thoughts and images, and major behaviors they experienced     Start:  03/17/23         Ely will identify 3 trauma related cognitive distortions     Start:  03/17/23         Averly will identify 3 cognitive distortions they are currently using and write reframing statements to replace them     Start:  03/17/23         Danean will review pleasant activities list and select 2 activities to practice weekly for the next 8 weeks     Start:  03/17/23             ProgressTowards Goals: Progressing  Interventions: Supportive and Other:  EMDR  Summary: Catherine Berg is a 43 y.o. female who presents with anxiety. Patient identifies symptoms to include uncontrollable worry, negative self affect, low mood, anxious feelings, tearfulness, avoidance, restlessness. Pt was oriented times 5. Pt was cooperative and engaged. Pt denies SI/HI/AVH.    Cln utilized the first half of session to discuss the patients progress in therapy. Readministered the GAD7 and PHQ9. The patient depression symptom scores decreased from 10 to 7. The patient anxiety symptoms decreased from 14 to 9.   Cln administered the PCL5 to assess for trauma symptoms with the patient scoring a 26, improved from a score of 57.   The patient reports improvements with her ability to cope and identify unhelpful cognitions.  Patient also reports improved frequency and duration of trauma symptoms.  However, patient expresses a desire to continue to address coping techniques to decrease duration of negative cognitions and flashbacks.  Patient content patient reports difficulty with motivation to keep her house organized and clean but expressed pride in her ability to clean her room and follow through with tasks stating it makes me feel good.  Clinician and patient continued EMDR by beginning to structure the patient's target sequence plan for the negative dominant belief I have to be in control.  Patient reflected on her [facility] memory of being removed from her mother's care due to history of substance use and making the decision to take her mom off life support.  Patient reflected on the distress she has experienced in missing out on time with her mother.  Patient identifies this abandonment has triggered uncontrollable anxiety related to her children graduating and moving out of the home.  Clinician and patient continue to process unresolved grief as well as controllable factors within her current situation.  For homework, patient was asked to constructing no send letter to  her mother to work towards processing her mother's death.  Patient agreed.  Suicidal/Homicidal: Nowithout intent/plan  Therapist Response: Cln utilized active and supportive  reflection to create a safe environment for patient to process recent life stressors. Clinician assessed for current symptoms, stressors, safety since last session.  Completed a progress reviewed with the patient and updated her treatment plan.  Continued EMDR by structuring patient's target sequence plan.  Continued to process unresolved grief.  Plan: Return again in 3 weeks.  Diagnosis: PTSD (post-traumatic stress disorder)  MDD (major depressive disorder), recurrent episode, mild (HCC)  GAD (generalized anxiety disorder)   Collaboration of Care: AEB psychiatrist can access notes and cln. Will review psychiatrists' notes. Check in with the patient and will see LCSW per availability. Patient agreed with treatment recommendations.   Patient/Guardian was advised Release of Information must be obtained prior to any record release in order to collaborate their care with an outside provider. Patient/Guardian was advised if they have not already done so to contact the registration department to sign all necessary forms in order for us  to release information regarding their care.   Consent: Patient/Guardian gives verbal consent for treatment and assignment of benefits for services provided during this visit. Patient/Guardian expressed understanding and agreed to proceed.   [name], LCSW 10/26/2023

## 2023-10-27 ENCOUNTER — Ambulatory Visit: Admitting: Family Medicine

## 2023-10-27 ENCOUNTER — Other Ambulatory Visit (HOSPITAL_COMMUNITY): Payer: Self-pay

## 2023-10-27 VITALS — BP 108/74 | HR 86 | Ht 63.0 in | Wt 199.4 lb

## 2023-10-27 DIAGNOSIS — Z3009 Encounter for other general counseling and advice on contraception: Secondary | ICD-10-CM

## 2023-10-27 DIAGNOSIS — Z975 Presence of (intrauterine) contraceptive device: Secondary | ICD-10-CM

## 2023-10-27 NOTE — Telephone Encounter (Signed)
 Good morning her PA is still good. Her plan will only pay for Wegovy  once a month and after running a test claim her script is refill too soon at this time last filled 10/02/23

## 2023-10-27 NOTE — Progress Notes (Signed)
   [facility] Problem Visit  [facility]  Subjective:  Catherine Berg is a 43 y.o. being seen today for   Chief Complaint  Patient presents with  . Acute Visit    HPI   Presents today for Nexplanon  check. Reports previously had a nexplanon - was able to feel this easily- but with this one- reports she has not been able to feel it.   Health Maintenance Due  Topic Date Due  . Medicare Annual Wellness (AWV)  Never done  . HPV VACCINES (1 - 3-dose SCDM series) Never done  . MAMMOGRAM  09/24/2023    ROS  The following portions of the patient's history were reviewed and updated as appropriate: allergies, current medications, past family history, past medical history, past social history, past surgical history and problem list. Problem list updated.   See flowsheet for other program required questions.  Objective:  There were no vitals filed for this visit.  Physical Exam  Skin:        Comments: Nexplanon  -red mark      Assessment and Plan:  Catherine Berg is a 43 y.o. female presenting to the [facility] Department for a Women's Health problem visit  1. Nexplanon  in place (Primary) -     No follow-ups on file.  Future Appointments  Date Time Provider Department Center  10/27/2023 11:10 AM Veda Hummingbird, FNP [facility] None  11/02/2023  4:30 PM Vickey Mettle, MD [facility] None  11/25/2023  8:00 AM Lanell Small B, LCSW [facility] None  12/16/2023  2:45 PM Isaiah Scrivener, MD [facility] None  12/20/2023  4:00 PM Lanell Small NOVAK, LCSW [facility] None  12/22/2023 11:45 AM Waddell Leonce NOVAK, MD PSS[facility]PSS None  01/11/2024  4:00 PM Lanell Small NOVAK, LCSW [facility] None  01/17/2024  9:40 AM Lorilee, Sven SQUIBB, MD CPR[facility]PR[facility] CPR  01/19/2024 10:40 AM Glenard Mire, MD [facility]    Hummingbird Veda, [place]

## 2023-10-29 ENCOUNTER — Other Ambulatory Visit: Payer: Self-pay | Admitting: Family Medicine

## 2023-10-29 DIAGNOSIS — Z1231 Encounter for screening mammogram for malignant neoplasm of breast: Secondary | ICD-10-CM

## 2023-10-29 NOTE — Progress Notes (Signed)
 [facility] Department STI clinic [address] [place] [address] Main phone: [phone]  STI screening visit  Subjective:  Catherine Berg is a 43 y.o. female being seen today for an STI screening visit. The patient reports they {Actions; do/do not:19616} have symptoms.  Patient reports that they {Actions; do/do not:19616} desire a pregnancy in the next year.   They reported they {Actions; are/are not:16769} interested in discussing contraception today.    No LMP recorded.  Patient has the following medical conditions:  There are no active problems to display for this patient.   No chief complaint on file.  Patient is a pleasant 43 y.o. female who presents to the office today requesting symptomatic/asymptomatic STI testing. Patient indicates ----- female/female partners in the last 2 months. She reports practicing vaginal, oral, anal sex and uses condoms sometimes. Patient indicates no STI history. Patient reports last sex was ---------. She indicates use of/ no use of -------------- as contraception method.  Patient indicates LMP was ------------.     Does the patient using douching products? {yes/no:20286}  Last HIV test per patient/review of record was  Lab Results  Component Value Date   HMHIVSCREEN Negative - Validated 05/16/2020   No results found for: "HIV"   Last HEPC test per patient/review of record was No results found for: "HMHEPCSCREEN" No components found for: "HEPC"   Last HEPB test per patient/review of record was No components found for: "HMHEPBSCREEN"   Patient reports last pap was:   No results found for: "DIAGPAP", "HPVHIGH", "ADEQPAP" No results found for: "SPECADGYN" No Cervical Cancer Screening results to display.  Screening for MPX risk: Does the patient have an unexplained rash? No Is the patient MSM? No Does the patient endorse multiple sex partners or anonymous sex partners? {yes/no:20286} Did the  patient have close or sexual contact with a person diagnosed with MPX? No Has the patient traveled outside the [place] where MPX is endemic? No Is there a high clinical suspicion for MPX-- evidenced by one of the following No  -Unlikely to be chickenpox  -Lymphadenopathy  -Rash that present in same phase of evolution on any given body part See flowsheet for further details and programmatic requirements.   Immunization history:   There is no immunization history on file for this patient.   The following portions of the patient's history were reviewed and updated as appropriate: allergies, current medications, past medical history, past social history, past surgical history and problem list.  Objective:  There were no vitals filed for this visit.  Physical Exam  Assessment and Plan:  Catherine Berg is a 43 y.o. female presenting to the [facility] Department for STI screening  1. Screening for venereal disease (Primary)    Patient accepted all screenings including ***oral, vaginal CT/GC and bloodwork for HIV/RPR, and wet prep. Patient meets criteria for HepB screening? {yes/no:20286}. Ordered? {Response; yes/no/na:63} Patient meets criteria for HepC screening? {yes/no:20286}. Ordered? {Response; yes/no/na:63}  Treat wet prep per standing order Discussed time line for State Lab results and that patient will be called with positive results and encouraged patient to call if she had not heard in 2 weeks.  Counseled to return or seek care for continued or worsening symptoms Recommended repeat testing in 3 months with positive results. Recommended condom use with all sex for STI prevention.   Patient is currently using {CCO Contraception:21020264} to prevent pregnancy.    No follow-ups on file.  Future Appointments  Date Time  Provider Department Center  08/09/2023  9:50 AM [facility] PROVIDER [facility] None   Total time with patient 20  minutes.   Edmonia James, NP
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
## 2023-11-01 ENCOUNTER — Encounter: Payer: Self-pay | Admitting: Gastroenterology

## 2023-11-02 ENCOUNTER — Encounter: Payer: Self-pay | Admitting: Psychiatry

## 2023-11-02 ENCOUNTER — Ambulatory Visit (INDEPENDENT_AMBULATORY_CARE_PROVIDER_SITE_OTHER): Admitting: Psychiatry

## 2023-11-02 ENCOUNTER — Other Ambulatory Visit: Payer: Self-pay

## 2023-11-02 VITALS — BP 113/84 | HR 93 | Temp 96.6°F | Ht 63.0 in | Wt 196.0 lb

## 2023-11-02 DIAGNOSIS — F330 Major depressive disorder, recurrent, mild: Secondary | ICD-10-CM | POA: Diagnosis not present

## 2023-11-02 DIAGNOSIS — F411 Generalized anxiety disorder: Secondary | ICD-10-CM

## 2023-11-02 DIAGNOSIS — G4700 Insomnia, unspecified: Secondary | ICD-10-CM

## 2023-11-02 DIAGNOSIS — F4310 Post-traumatic stress disorder, unspecified: Secondary | ICD-10-CM

## 2023-11-02 MED ORDER — VENLAFAXINE HCL ER 150 MG PO CP24: 150.0000 mg | ORAL_CAPSULE | Freq: Every day | ORAL | 3 refills | Status: DC

## 2023-11-02 MED ORDER — BUSPIRONE HCL 5 MG PO TABS: 5.0000 mg | ORAL_TABLET | Freq: Two times a day (BID) | ORAL | 0 refills | Status: DC

## 2023-11-02 MED ORDER — VENLAFAXINE HCL ER 75 MG PO CP24: 75.0000 mg | ORAL_CAPSULE | Freq: Every day | ORAL | 1 refills | Status: DC

## 2023-11-02 NOTE — Patient Instructions (Signed)
 Increase venlafaxine  225 mg daily   Continue buspar  5 mg twice a day  Continue trazodone  100 mg at night as needed for insomnia Continue hydroxyzine  10 mg daily as needed for anxiety  Next appointment: 8/11 at 4 PM

## 2023-11-17 ENCOUNTER — Ambulatory Visit
Admission: RE | Admit: 2023-11-17 | Discharge: 2023-11-17 | Disposition: A | Discharge status: Home / Self Care | Source: Ambulatory Visit | Attending: Internal Medicine | Admitting: Internal Medicine

## 2023-11-17 DIAGNOSIS — R918 Other nonspecific abnormal finding of lung field: Secondary | ICD-10-CM | POA: Insufficient documentation

## 2023-11-25 ENCOUNTER — Encounter: Payer: Self-pay | Admitting: Licensed Clinical Social Worker

## 2023-11-25 ENCOUNTER — Ambulatory Visit (INDEPENDENT_AMBULATORY_CARE_PROVIDER_SITE_OTHER): Admitting: Licensed Clinical Social Worker

## 2023-11-25 DIAGNOSIS — F411 Generalized anxiety disorder: Secondary | ICD-10-CM

## 2023-11-25 DIAGNOSIS — F330 Major depressive disorder, recurrent, mild: Secondary | ICD-10-CM

## 2023-11-25 DIAGNOSIS — F4310 Post-traumatic stress disorder, unspecified: Secondary | ICD-10-CM

## 2023-11-25 NOTE — Progress Notes (Signed)
 THERAPIST PROGRESS NOTE  Session Time: 8:02am-9am  Participation Level: Active  Behavioral Response: CasualAlertEuthymic  Type of Therapy: Individual Therapy  Treatment Goals addressed:  Active     BH CCP Acute or Chronic Trauma Reaction     LTG: Recall traumatic events without becoming overwhelmed with negative emotions (Progressing)     Start:  03/17/23    Expected End:  01/26/24       Goal Note     10/26/23: Pt reports If my mind does not take me to a place that I don't want to go to then I don't have any symptoms.          LTG: Pt identifies she would like to process her trauma hx (Progressing)     Start:  03/17/23    Expected End:  01/26/24       Goal Note     10/26/23: Pt reports          LTG: I want to be where the things I don't have control over, I can learn to let go.  (Progressing)     Start:  03/17/23    Expected End:  01/26/24       Goal Note     10/26/23: Pt reports          STG: Kanylah will identify internal and external stimuli that trigger PTSD symptoms (Progressing)     Start:  03/17/23    Expected End:  01/26/24       Goal Note     10/26/23: Pt reports          Provide Mechel with education on trauma-oriented therapy     Start:  03/17/23         Educate Nikko on common reactions to a traumatic experience     Start:  03/17/23         Educate Raziah that exposure to trauma may result in brain and hormonal changes that can lead to difficulties with memory, learning, emotional regulation, poor impulse control, or depression that can persist     Start:  03/17/23         Encourage Luria to identify 3 trauma related cognitive distortions     Start:  03/17/23         Coping Skills      Start:  03/17/23      Will Work with patient to decrease the frequency of negative self-descriptive statements and increase the frequency of positive self- descriptive statements using CBT/DBT/REBT techniques per patient self report 3  out of 5 documented sessions. Some of the techniques that will be used will be CBT, positive affirmations, role playing, modeling, homework and journaling.          OP Depression     LTG: Reduce frequency, intensity, and duration of depression symptoms so that daily functioning is improved (Progressing)     Start:  03/17/23    Expected End:  01/26/24       Goal Note     10/26/23: Pt reports It's not as often. Sometimes I wonder about the severity but I guess here lately I can get myself out of that place before getting there.          STG: Shelagh will identify cognitive patterns and beliefs that support depression (Progressing)     Start:  03/17/23    Expected End:  01/26/24       Goal Note     10/26/23: Pt reports Sometimes I know what  some triggers are and some thoughts it is hard to get rid of those thoughts.         LTG: I just want to believe that I am okay to keep pushing and thriving. Not allow things to have control over you. (Progressing)     Start:  03/17/23    Expected End:  01/26/24       Goal Note     10/26/23: Pt reports I feel there are still things that I have control over me even though I am always working to improve. I am trying my best but I don't win everyday.         Work with [name] to identify the major components of a recent episode of depression: physical symptoms, major thoughts and images, and major behaviors they experienced     Start:  03/17/23         Kaya will identify 3 trauma related cognitive distortions     Start:  03/17/23         Kialee will identify 3 cognitive distortions they are currently using and write reframing statements to replace them     Start:  03/17/23         Trenia will review pleasant activities list and select 2 activities to practice weekly for the next 8 weeks     Start:  03/17/23            ProgressTowards Goals: Progressing  Interventions: CBT, Supportive, and Reframing  Summary: HELLENA PRIDGEN is a 43 y.o. female who presents with anxiety. Patient identifies symptoms to include uncontrollable worry, negative self affect, low mood, anxious feelings, tearfulness, avoidance, restlessness. Pt was oriented times 5. Pt was cooperative and engaged. Pt denies SI/HI/AVH.    Patient utilized therapeutic space to process unhealthy relationship with food following use of weight loss drugs.  Initial and worked through Public affairs consultant questioning with patient to explore her relationship with self acceptance following her breast reduction.  Patient was able to redefined health citing improvements to asthma, physical activity, and chronic fatigue would be measurable goals.  Reflected on relationships both with herself and others before COVID citing she was more outgoing and more social.  Patient worked with clinician to challenge the negative cognition I have to be in control.  Patient expressed a fear of isolating from the world due to anxiety around social interactions.  Clinician provided psychoeducation on social anxiety as well as safety behaviors.    For homework, the patient was asked to observe and take note of use of safety behaviors to reflect on an upcoming session.  Suicidal/Homicidal: Nowithout intent/plan  Therapist Response: Cln utilized active and supportive reflection to create a safe environment for patient to process recent life stressors. Clinician assessed for current symptoms, stressors, safety since last session. Utilized CBT to work with patient in reframing negative cognitions around her health. Clinician provided psychoeducation on social anxiety as well as safety behaviors.   Plan: Return again in 3 weeks.  Diagnosis: PTSD (post-traumatic stress disorder)  MDD (major depressive disorder), recurrent episode, mild (HCC)  GAD (generalized anxiety disorder)   Collaboration of Care: AEB psychiatrist can access notes and cln. Will review psychiatrists' notes. Check in with the  patient and will see LCSW per availability. Patient agreed with treatment recommendations.   Patient/Guardian was advised Release of Information must be obtained prior to any record release in order to collaborate their care with an outside provider. Patient/Guardian was advised if they have not already  done so to contact the registration department to sign all necessary forms in order for us  to release information regarding their care.   Consent: Patient/Guardian gives verbal consent for treatment and assignment of benefits for services provided during this visit. Patient/Guardian expressed understanding and agreed to proceed.   [name], LCSW 11/25/2023

## 2023-12-07 ENCOUNTER — Ambulatory Visit
Admission: RE | Admit: 2023-12-07 | Discharge: 2023-12-07 | Disposition: A | Discharge status: Home / Self Care | Source: Ambulatory Visit | Attending: Family Medicine | Admitting: Family Medicine

## 2023-12-07 DIAGNOSIS — Z1231 Encounter for screening mammogram for malignant neoplasm of breast: Secondary | ICD-10-CM | POA: Insufficient documentation

## 2023-12-08 NOTE — Progress Notes (Signed)
 HPI:   Catherine Berg is a 43 y.o. female who presents for follow up post-ablative hypothyroidism.  She was originally diagnosed with Graves' disease and was treated with radioiodine in 5/17.  Her TSH was mildly elevated by 9/17 and she started low-dose thyroid  hormone.  The dose was increased in 12/17.  She became pregnant sometime around that time and continued thyroid  hormone throughout pregnancy.  In 3/18, she was [redacted] weeks pregnant and was on 75 mcg of levothyroxine  daily.  At some point after she delivered, she went off her thyroid  medication and her TSH remained normal.  In 3/20 she had been off medication for at least 12 months.  TSH was normal, so we left her off medication.  She went on to develop fatigue and weight gain, so we rechecked her TSH in 6/20.  It was mildly elevated and she wanted to go back on medication, so I started her on LT4.  I last saw her in 3/25.  At that time, I increased her LT4 and vit D.   She is on LT4 112 mcg daily.  She is feeling well overall.  She denies tremors, but does have some palpitations.  Her weight was not taken today.  She has no anterior neck pain or swelling.  She is concerned about her thyroid .   ROS:  No chest pain.  No shortness of breath.   Medical History: Past Medical History:  Diagnosis Date  . Anosmia   . Anxiety   . Asthma without status asthmaticus (HHS-HCC)   . 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   . Chest pain   . Edema   . Family history of coronary artery disease   . Gallstones   . GERD (gastroesophageal reflux disease)   . Iron deficiency anemia   . Postablative hypothyroidism   . Thyroid  nodule     Surgical History: Past Surgical History:  Procedure Laterality Date  . CESAREAN DELIVERY  2005  . CESAREAN SECTION  2008  . cervical polypectomy    . colonoscopy    . EGD    . givens capsule study      Social History:  reports that she has never smoked. She has never used smokeless tobacco. She reports that she does not drink  alcohol and does not use drugs.  She worked as an International aid/development worker at [employer].  Family History: family history includes ADD / ADHD in her brother; Alcohol abuse in her father and mother; Anxiety in her mother; Asthma in her child and mother; Bipolar disorder in her mother; COPD in her mother; Cancer in her mother; Colon cancer in her paternal grandfather; Depression in her mother; Diabetes in her maternal grandmother; Drug abuse in her mother; Heart disease in her maternal grandmother, mother, and paternal grandmother; Heart failure in her mother; High blood pressure (Hypertension) in her maternal grandfather, maternal grandmother, mother, paternal grandfather, and paternal grandmother; Hyperlipidemia (Elevated cholesterol) in her brother; Mental illness in her mother; Myocardial Infarction (Heart attack) in her maternal grandmother and mother; Ovarian cancer in her maternal grandmother; Rheum arthritis in her mother; Thyroid  disease in her paternal aunt.  Medications: Current Outpatient Medications  Medication Sig Dispense Refill  . albuterol  90 mcg/actuation inhaler Inhale 2 inhalations into the lungs every 6 (six) hours as needed    . amLODIPine  (NORVASC ) 5 MG tablet Take 5 mg by mouth once daily    . buPROPion  (WELLBUTRIN  XL) 300 MG XL tablet Take 1 tablet by mouth once daily    .  celecoxib  (CELEBREX ) 200 MG capsule Take 200 mg by mouth once daily    . clobetasoL  (CORMAX ) 0.05 % external solution Apply 1 Application  topically 2 (two) times daily    . cyanocobalamin  (VITAMIN B12) 1000 MCG tablet Take 1,000 mcg by mouth once daily.    SABRA dextroamphetamine-amphetamine  (ADDERALL XR) 15 MG XR capsule Take 1 capsule by mouth once daily    . DULoxetine  (CYMBALTA ) 60 MG DR capsule Take 60 mg by mouth once daily Take with 30 mg capsule for 90 mg total.    . ergocalciferol , vitamin D2, 1,250 mcg (50,000 unit) capsule Take 1 capsule (50,000 Units total) by mouth 3 (three) times a week 36 capsule 4   . etonogestreL  (NEXPLANON ) 68 mg implant Inject subcutaneously    . HYDROcodone -acetaminophen  (NORCO) 5-325 mg tablet     . hydrOXYzine  HCL (ATARAX ) 10 MG tablet Take 10 mg by mouth 3 (three) times daily as needed    . levothyroxine  (SYNTHROID ) 112 MCG tablet Take 1 tablet (112 mcg total) by mouth once daily Take on an empty stomach with a glass of water  at least 30-60 minutes before breakfast. 90 tablet 4  . linaCLOtide  (LINZESS ) 290 mcg capsule Take 1 capsule (290 mcg total) by mouth once daily 90 capsule 1  . mometasone -formoterol  (DULERA ) 200-5 mcg/actuation inhaler Inhale into the lungs    . montelukast  (SINGULAIR ) 10 mg tablet Take 10 mg by mouth once daily    . pregabalin  (LYRICA ) 100 MG capsule Take 100 mg by mouth at bedtime as needed    . topiramate  (TOPAMAX ) 25 MG tablet Take 25 mg by mouth at bedtime    . traZODone  (DESYREL ) 50 MG tablet Take 25-50 mg by mouth at bedtime as needed    . valACYclovir  (VALTREX ) 1000 MG tablet Take 1,000 mg by mouth once daily as needed     No current facility-administered medications for this visit.    Allergies: Allergies  Allergen Reactions  . Latex Unknown    Balloons, condoms, underwear elastic, gloves   . Shellfish Containing Products Unknown    Physical Exam: Vitals:   12/08/23 0931  Height: 160 cm (5' 3)   Body mass index is 41.45 kg/m. Gen:  WDWN in NAD  NECK: ...  Physical exam otherwise deferred due to coronavirus precautions.   Labs: 09/16/2015: Nuclear medicine thyroid  scan with 6-hour uptake of 20.2 (5-20) and 24-hour uptake = 39 (10-30) scan showed heterogeneous uptake without hot or cold nodules. 09/11/2015: 21.08 mCi of radioiodine administered 01/08/2016: TSH = 5.48 (25 mcg of levothyroxine  started). 04/22/2016: TSH = 21.697 09/29/2016: TSH = 3.237 01/05/2017: Thyroid  ultrasound with heterogeneous echotexture but no discrete nodules. 03/30/2017: TSH = 4.12, free T4 = 1.0 08/03/2017: TSH = 3.76.  Free T4 = 1.1.  K/Cr/Ca  = 4.1/0 0.78/9.4. 12/29/2017: TSH=3.43. 06/02/2018: TSH = 4.06 10/13/2018:  TSH=6.113. 03/23/2019:  TSH=3.668.  10/31/2019: TSH = 4.0.   07/11/2020:  TSH = 3.192.  11/17/2021:  TSH = 3.86.   06/17/2022:  TSH = 4.55.  K/Cr/Ca = 4.4/0.74/9.3.  LFTs nl.  D = 10.  B12 = 299 ([phone]). 01/06/2023:  TSH = 4.559.  Vitamin D  =15.8.   06/17/2023: TSH = 3.931.  Vitamin D  =13.8.   12/01/2023:  TSH=1.879.  Celiac panel negative.  D=128.  Assessment/Plan: 1.  Post ablation hypothyroidism.  She had Graves' disease and was treated with 20.08 mCi of radioiodine in 5/17.  She went on to develop hypothyroidism and was started on levothyroxine .  She then  went off medication and was off it for over 12 months before re-starting in 7/20.  Her TSH was upper nl on LT4 88 mcg daily, and hypo end of nl in 2/25 on LT4 100 mcg.  Her most recent TSH in 7/25 was nl on LT4 112 mcg daily. I will continue her on the same dose.   2.  Vitamin D  deficiency.   Her D was 10 in 2/24 on OTC supplementation so her pcp started her on rx strength D supplementation.  Her D level was low in 2/25 while on rx strength D supplementation weekly (Monday and Thursday).  It was actually lower than in 2/24 when she was on rx once a week. Her most recent D level in 7/25 was elevated on D supplementation to three times per week (Monday, Wednesday, and Friday). Therefore, I will have her go back to twice a week (Mondays and Fridays). I did a celiac screen to make sure there was no cause for her vitamin D  deficiency. It was negative.   3.  She will return to clinic in 6 months.  She will have labs ahead of the visit.  This note is partially prepared by Earla Daria Messier, Scribe, in the presence of and acting as the scribe of Dr. Debby Breaker , MD.    [facility], MD

## 2023-12-09 NOTE — Progress Notes (Signed)
 No show

## 2023-12-10 ENCOUNTER — Ambulatory Visit

## 2023-12-13 ENCOUNTER — Ambulatory Visit (INDEPENDENT_AMBULATORY_CARE_PROVIDER_SITE_OTHER): Payer: Self-pay | Admitting: Psychiatry

## 2023-12-13 DIAGNOSIS — Z91199 Patient's noncompliance with other medical treatment and regimen due to unspecified reason: Secondary | ICD-10-CM

## 2023-12-16 ENCOUNTER — Encounter: Payer: Self-pay | Admitting: Internal Medicine

## 2023-12-16 ENCOUNTER — Ambulatory Visit (INDEPENDENT_AMBULATORY_CARE_PROVIDER_SITE_OTHER): Admitting: Internal Medicine

## 2023-12-16 VITALS — BP 108/80 | HR 73 | Temp 98.9°F | Ht 63.0 in | Wt 186.8 lb

## 2023-12-16 DIAGNOSIS — R0689 Other abnormalities of breathing: Secondary | ICD-10-CM

## 2023-12-16 LAB — NITRIC OXIDE: Nitric Oxide: 10

## 2023-12-16 NOTE — Progress Notes (Signed)
 [facility] Consultation     Date: 12/16/2023  MRN# [id number] Catherine Berg 09/05/1980   Catherine Berg is a 43 y.o. old female seen in consultation for chief complaint of:   CBC 09/14/18>>122 HST 07/05/18>> AHI of 1.4. **CT chest 03/31/2018>>  there is a small 4 mm or less nodule in the right lower lobe peripherally.   CC Follow-up assessment for asthma  HPI:   Previous history and diagnosis of asthma with obesity PFTs February 2024 reviewed with patient in detail FEV1 62% predicted with a reduced ratio-findings consistent with moderate obstructive lung disease On her inhaled corticosteroid and long-acting beta agonist Dulera   Patient underwent breast reduction surgery and subsequently developed severe breast infection was admitted for extensive IV antibiotic therapy Patient developed shortness of breath since then mostly described as inability to take deep breaths, signs of restrictive physiology No signs of infection at this time On exam patient is restrictive in her inspiratory capacity No wheezing at this time  No exacerbation at this time No evidence of heart failure at this time No evidence or signs of infection at this time No respiratory distress No fevers, chills, nausea, vomiting, diarrhea No evidence of lower extremity edema No evidence hemoptysis  Obesity current weight 186 5 feet 3 inches currently on Wegovy  Had lost 40 pounds  Regarding her lung nodule CT imaging reviewed with patient Patient would like repeat CT chest to assess right lung nodule Repeat CT chest May 2022 showed stable pulmonary nodules  Will repeat CT chest for further assessment   She does have a dog and allergic to dogs Not in the bedroom Has carpet in her bedroom      Social Hx:   Social History   Tobacco Use   Smoking status: Never   Smokeless tobacco: Never  Vaping Use   Vaping status: Never Used  Substance Use Topics   Alcohol use: No   Drug use:  No   Medication:    Current Outpatient Medications:    amLODipine  (NORVASC ) 2.5 MG tablet, Take 1 tablet (2.5 mg total) by mouth daily., Disp: 90 tablet, Rfl: 0   bisacodyl  (DULCOLAX) 5 MG EC tablet, Take 1 tablet (5 mg total) by mouth daily as needed for moderate constipation., Disp: 30 tablet, Rfl: 1   busPIRone  (BUSPAR ) 5 MG tablet, Take 1 tablet (5 mg total) by mouth 2 (two) times daily., Disp: 180 tablet, Rfl: 0   fluconazole  (DIFLUCAN ) 150 MG tablet, Take 1 tablet (150 mg total) by mouth every other day., Disp: 3 tablet, Rfl: 0   fluticasone  (FLONASE ) 50 MCG/ACT nasal spray, Place 2 sprays into both nostrils daily., Disp: 1 g, Rfl: 5   hydrochlorothiazide  (HYDRODIURIL ) 12.5 MG tablet, Take 1 tablet (12.5 mg total) by mouth daily., Disp: 90 tablet, Rfl: 0   lactulose  (CHRONULAC ) 10 GM/15ML solution, Take 30 mLs (20 g total) by mouth 2 (two) times daily as needed for moderate constipation., Disp: 473 mL, Rfl: 0   levothyroxine  (SYNTHROID ) 112 MCG tablet, Take 112 mcg by mouth every morning., Disp: , Rfl:    linaclotide  (LINZESS ) 145 MCG CAPS capsule, Take 1 capsule (145 mcg total) by mouth daily before breakfast., Disp: 90 capsule, Rfl: 1   metFORMIN  (GLUCOPHAGE ) 500 MG tablet, Take 1 tablet (500 mg total) by mouth daily with breakfast. (Patient taking differently: Take 500 mg by mouth daily with breakfast. Takes for long covid neuropathy), Disp: 90 tablet, Rfl: 3   mometasone -formoterol  (DULERA ) 200-5 MCG/ACT AERO, Inhale  2 puffs into the lungs in the morning and at bedtime., Disp: 1 each, Rfl: 11   montelukast  (SINGULAIR ) 10 MG tablet, Take 1 tablet (10 mg total) by mouth at bedtime., Disp: 90 tablet, Rfl: 1   omeprazole  (PRILOSEC) 20 MG capsule, Take 1 capsule (20 mg total) by mouth 2 (two) times daily before a meal., Disp: 90 capsule, Rfl: 3   Semaglutide -Weight Management (WEGOVY ) 2.4 MG/0.75ML SOAJ, Inject 2.4 mg into the skin once a week., Disp: 9 mL, Rfl: 0   topiramate  (TOPAMAX ) 100  MG tablet, Take 1 tablet (100 mg total) by mouth at bedtime., Disp: 90 tablet, Rfl: 3   traZODone  (DESYREL ) 100 MG tablet, Take 1 tablet (100 mg total) by mouth at bedtime as needed for sleep., Disp: 90 tablet, Rfl: 0   venlafaxine  XR (EFFEXOR -XR) 150 MG 24 hr capsule, Take 1 capsule (150 mg total) by mouth daily with breakfast., Disp: 30 capsule, Rfl: 3   venlafaxine  XR (EFFEXOR -XR) 75 MG 24 hr capsule, Take 1 capsule (75 mg total) by mouth daily with breakfast. Total of 225 mg daily, take along with 150 mg cap, Disp: 30 capsule, Rfl: 1   VENTOLIN  HFA 108 (90 Base) MCG/ACT inhaler, Inhale 2 puffs into the lungs every 4 (four) hours as needed for wheezing or shortness of breath., Disp: 18 g, Rfl: 10   Vitamin D , Ergocalciferol , (DRISDOL ) 1.25 MG (50000 UNIT) CAPS capsule, Take 1 capsule (50,000 Units total) by mouth every 7 (seven) days., Disp: 12 capsule, Rfl: 1   Allergies:  Latex and Shellfish allergy    BP 108/80 (BP Location: Right Arm, Patient Position: Sitting, Cuff Size: Large)   Pulse 73   Temp 98.9 F (37.2 C) (Oral)   Ht 5' 3 (1.6 m)   Wt 186 lb 12.8 oz (84.7 kg)   SpO2 99%   BMI 33.09 kg/m      Review of Systems: Gen:  Denies  fever, sweats, chills weight loss  HEENT: Denies blurred vision, double vision, ear pain, eye pain, hearing loss, nose bleeds, sore throat Cardiac:  No dizziness, chest pain or heaviness, chest tightness,edema, No JVD Resp:   No cough, -sputum production, -shortness of breath,-wheezing, -hemoptysis,  Other:  All other systems negative   Physical Examination:   General Appearance: No distress  EYES PERRLA, EOM intact.   NECK Supple, No JVD Pulmonary: normal breath sounds, No wheezing.  CardiovascularNormal S1,S2.  No m/r/g.   Abdomen: Benign, Soft, non-tender. Neurology UE/LE 5/5 strength, no focal deficits Ext pulses intact, cap refill intact ALL OTHER ROS ARE NEGATIVE      Assessment and Plan:  43 year old pleasant  African-American female with a history of asthma/COPD and obesity with previous diagnosis of COVID-19 infection  At this time her asthma is stable however she has increased shortness of breath due to restrictive physiology due to breast and chest wall infection Her inspiratory capacity is diminished over the last several months Recommend incentive spirometry and increase exercise capacity    Asthma /COPD Recommend incentive spirometry 10-15 times per day Continue inhalers as prescribed with Dulera  Please rinse mouth after use Avoid Allergens and Irritants Avoid secondhand smoke Avoid SICK contacts Recommend  Masking  when appropriate Recommend Keep up-to-date with vaccinations Pulmonary function test reviewed with patient in detail  FEV1 62% predicted  Signifies moderate obstructive lung disease  Continue inhalers as prescribed with Dulera  and albuterol  as needed   Previous history of lung nodule lung nodule. It has been deemed benign 4 mm  nodule in the right lower lobe incidentally found in CT chest in 2018 repeat chest in 2019 did not show any interval changes therefore it was deemed necessary not to follow-up with CT scans but will discuss with patient CT chest July 2025 no significant increase in subcentimeter pulmonary nodules Findings are consistent with benign etiology  Respiratory insufficiency Recommend incentive spirometry  Allergic rhinitis Recommend Flonase   Continue current weight loss journey Patient currently on Wegovy  Patient lost 40 pounds    MEDICATION ADJUSTMENTS/LABS AND TESTS ORDERED: Continue DULERA   Start FLONASE  2 sprays each nostril daily Albuterol  as needed Avoid Allergens and Irritants Avoid secondhand smoke Avoid SICK contacts Recommend  Masking  when appropriate Recommend Keep up-to-date with vaccinations Incentive spirometry 10-15 times per day Recommend weight loss AVOID POLLEN, SMOKE, DUST, AVOID DOGS as much as you can   CURRENT  MEDICATIONS REVIEWED AT LENGTH WITH PATIENT TODAY   Patient  satisfied with Plan of action and management. All questions answered   Follow up 6 months   I spent a total of 48 minutes dedicated to the care of this patient on the date of this encounter to include pre-visit review of records, face-to-face time with the patient discussing conditions above, post visit ordering of testing, clinical documentation with the electronic health record, making appropriate referrals as documented, and communicating necessary information to the patient's healthcare team.    The Patient requires high complexity decision making for assessment and support, frequent evaluation and titration of therapies, application of advanced monitoring technologies and extensive interpretation of multiple databases.  Patient satisfied with Plan of action and management. All questions answered    Nickolas Alm Cellar, M.D.  [facility]  Medical Director [facility] Medical Director [facility] Cardio-Pulmonary Department

## 2023-12-16 NOTE — Patient Instructions (Signed)
 Congratulations on weight loss!!  Continue Dulera  as prescribed Can use incentive spirometry 10-15 times per day to help with breathing exercises  Avoid Allergens and Irritants Avoid secondhand smoke Avoid SICK contacts Recommend  Masking  when appropriate Recommend Keep up-to-date with vaccinations

## 2023-12-20 ENCOUNTER — Ambulatory Visit (INDEPENDENT_AMBULATORY_CARE_PROVIDER_SITE_OTHER): Admitting: Licensed Clinical Social Worker

## 2023-12-20 DIAGNOSIS — F330 Major depressive disorder, recurrent, mild: Secondary | ICD-10-CM | POA: Diagnosis not present

## 2023-12-20 DIAGNOSIS — F411 Generalized anxiety disorder: Secondary | ICD-10-CM | POA: Diagnosis not present

## 2023-12-20 DIAGNOSIS — F4310 Post-traumatic stress disorder, unspecified: Secondary | ICD-10-CM | POA: Diagnosis not present

## 2023-12-20 NOTE — Progress Notes (Signed)
 THERAPIST PROGRESS NOTE  Session Time: 4-5:06pm  Participation Level: Active  Behavioral Response: CasualAlertEuthymic  Type of Therapy: Individual Therapy  Treatment Goals addressed:  Active     BH CCP Acute or Chronic Trauma Reaction     LTG: Recall traumatic events without becoming overwhelmed with negative emotions (Progressing)     Start:  03/17/23    Expected End:  01/26/24       Goal Note     10/26/23: Pt reports If my mind does not take me to a place that I don't want to go to then I don't have any symptoms.          LTG: Pt identifies she would like to process her trauma hx (Progressing)     Start:  03/17/23    Expected End:  01/26/24       Goal Note     10/26/23: Pt reports          LTG: I want to be where the things I don't have control over, I can learn to let go.  (Progressing)     Start:  03/17/23    Expected End:  01/26/24       Goal Note     10/26/23: Pt reports          STG: Catherine Berg will identify internal and external stimuli that trigger PTSD symptoms (Progressing)     Start:  03/17/23    Expected End:  01/26/24       Goal Note     10/26/23: Pt reports          Provide Catherine Berg with education on trauma-oriented therapy     Start:  03/17/23         Educate Catherine Berg on common reactions to a traumatic experience     Start:  03/17/23         Educate Catherine Berg that exposure to trauma may result in brain and hormonal changes that can lead to difficulties with memory, learning, emotional regulation, poor impulse control, or depression that can persist     Start:  03/17/23         Encourage Catherine Berg to identify 3 trauma related cognitive distortions     Start:  03/17/23         Coping Skills      Start:  03/17/23      Will Work with patient to decrease the frequency of negative self-descriptive statements and increase the frequency of positive self- descriptive statements using CBT/DBT/REBT techniques per patient self report 3 out  of 5 documented sessions. Some of the techniques that will be used will be CBT, positive affirmations, role playing, modeling, homework and journaling.          OP Depression     LTG: Reduce frequency, intensity, and duration of depression symptoms so that daily functioning is improved (Progressing)     Start:  03/17/23    Expected End:  01/26/24       Goal Note     10/26/23: Pt reports It's not as often. Sometimes I wonder about the severity but I guess here lately I can get myself out of that place before getting there.          STG: Catherine Berg will identify cognitive patterns and beliefs that support depression (Progressing)     Start:  03/17/23    Expected End:  01/26/24       Goal Note     10/26/23: Pt reports Sometimes I know what  some triggers are and some thoughts it is hard to get rid of those thoughts.         LTG: I just want to believe that I am okay to keep pushing and thriving. Not allow things to have control over you. (Progressing)     Start:  03/17/23    Expected End:  01/26/24       Goal Note     10/26/23: Pt reports I feel there are still things that I have control over me even though I am always working to improve. I am trying my best but I don't win everyday.         Work with Catherine Berg to identify the major components of a recent episode of depression: physical symptoms, major thoughts and images, and major behaviors they experienced     Start:  03/17/23         Catherine Berg will identify 3 trauma related cognitive distortions     Start:  03/17/23         Catherine Berg will identify 3 cognitive distortions they are currently using and write reframing statements to replace them     Start:  03/17/23         Catherine Berg will review pleasant activities list and select 2 activities to practice weekly for the next 8 weeks     Start:  03/17/23            ProgressTowards Goals: Progressing  Interventions: Supportive, Reframing, and Other: EMDR  Summary:  Catherine Berg is a 43 y.o. female who presents with anxiety. Patient identifies symptoms to include uncontrollable worry, negative self affect, low mood, anxious feelings, tearfulness, avoidance, restlessness. Pt was oriented times 5. Pt was cooperative and engaged. Pt denies SI/HI/AVH.    Patient continues to report marked improvement with her depression and anxiety. She reflected on efforts she has made and reframing unhelpful perspectives and celebrates her ability to cope with trauma triggers.  Patient continued EMDR with therapist by completing the construction of her target sequence plan for the belief I have no control .  The patient identified lived experiences resulting in childhood trauma, feeling unloved, and coping with grief.  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  Clinician worked with patient on identifying controllable factors today.  Patient reports her next session she feels ready to begin processing her target sequence plan and [facility] memory.  Suicidal/Homicidal: Nowithout intent/plan  Therapist Response: Cln utilized active and supportive reflection to create a safe environment for patient to process recent life stressors. Clinician assessed for current symptoms, stressors, safety since last session.  Completed patient's target sequence plan for EMDR treatment.  Reflected on controllable factors specifically related to her role as a mother.  Plan: Return again in 3 weeks.  Diagnosis: PTSD (post-traumatic stress disorder)  MDD (major depressive disorder), recurrent episode, mild (HCC)  GAD (generalized anxiety disorder)   Collaboration of Care: AEB psychiatrist can access notes and cln. Will review psychiatrists' notes. Check in with the patient and will see LCSW per availability. Patient agreed with treatment recommendations.   Patient/Guardian was  advised Release of Information must be obtained prior to any record release in order to collaborate their care with an outside provider. Patient/Guardian was advised if they have not already done so to contact the registration department to sign all necessary forms in order for us  to release information  regarding their care.   Consent: Patient/Guardian gives verbal consent for treatment and assignment of benefits for services provided during this visit. Patient/Guardian expressed understanding and agreed to proceed.   [name], LCSW 12/20/2023

## 2023-12-21 ENCOUNTER — Ambulatory Visit (INDEPENDENT_AMBULATORY_CARE_PROVIDER_SITE_OTHER): Admitting: Psychiatry

## 2023-12-21 ENCOUNTER — Encounter: Payer: Self-pay | Admitting: Psychiatry

## 2023-12-21 VITALS — BP 118/76 | HR 77 | Temp 98.3°F | Ht 63.0 in | Wt 184.2 lb

## 2023-12-21 DIAGNOSIS — F411 Generalized anxiety disorder: Secondary | ICD-10-CM

## 2023-12-21 DIAGNOSIS — F3341 Major depressive disorder, recurrent, in partial remission: Secondary | ICD-10-CM

## 2023-12-21 DIAGNOSIS — G4700 Insomnia, unspecified: Secondary | ICD-10-CM

## 2023-12-21 DIAGNOSIS — F4310 Post-traumatic stress disorder, unspecified: Secondary | ICD-10-CM

## 2023-12-21 MED ORDER — VENLAFAXINE HCL ER 75 MG PO CP24: 75.0000 mg | ORAL_CAPSULE | Freq: Every day | ORAL | 1 refills | Status: DC

## 2023-12-21 MED ORDER — BUSPIRONE HCL 5 MG PO TABS: 5.0000 mg | ORAL_TABLET | Freq: Two times a day (BID) | ORAL | 0 refills | Status: DC

## 2023-12-21 MED ORDER — TRAZODONE HCL 100 MG PO TABS: 100.0000 mg | ORAL_TABLET | Freq: Every evening | ORAL | 1 refills | Status: DC | PRN
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
## 2023-12-22 ENCOUNTER — Ambulatory Visit (INDEPENDENT_AMBULATORY_CARE_PROVIDER_SITE_OTHER): Payer: Medicare Other | Admitting: Plastic Surgery

## 2023-12-22 ENCOUNTER — Encounter: Payer: Self-pay | Admitting: Plastic Surgery

## 2023-12-22 VITALS — BP 133/83 | HR 67 | Ht 63.0 in | Wt 184.0 lb

## 2023-12-22 DIAGNOSIS — Z09 Encounter for follow-up examination after completed treatment for conditions other than malignant neoplasm: Secondary | ICD-10-CM | POA: Diagnosis not present

## 2023-12-22 DIAGNOSIS — N62 Hypertrophy of breast: Secondary | ICD-10-CM

## 2023-12-22 NOTE — Progress Notes (Signed)
 Catherine Berg returns today for evaluation after bilateral breast reduction complicated by a left breast infection.  She is doing well with no complaints.  On examination all incisions are clean dry and intact.  She has good shape size and symmetry.  Doing well postoperatively.  Photographs were obtained today with her consent.  She may follow-up as needed.

## 2024-01-05 ENCOUNTER — Other Ambulatory Visit: Payer: Self-pay | Admitting: Family Medicine

## 2024-01-05 DIAGNOSIS — J302 Other seasonal allergic rhinitis: Secondary | ICD-10-CM

## 2024-01-05 DIAGNOSIS — J4520 Mild intermittent asthma, uncomplicated: Secondary | ICD-10-CM

## 2024-01-11 ENCOUNTER — Ambulatory Visit (INDEPENDENT_AMBULATORY_CARE_PROVIDER_SITE_OTHER): Admitting: Licensed Clinical Social Worker

## 2024-01-11 DIAGNOSIS — F3341 Major depressive disorder, recurrent, in partial remission: Secondary | ICD-10-CM | POA: Diagnosis not present

## 2024-01-11 DIAGNOSIS — F411 Generalized anxiety disorder: Secondary | ICD-10-CM | POA: Diagnosis not present

## 2024-01-11 DIAGNOSIS — F4310 Post-traumatic stress disorder, unspecified: Secondary | ICD-10-CM | POA: Diagnosis not present
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
## 2024-01-17 ENCOUNTER — Encounter: Payer: Self-pay | Admitting: Physical Medicine and Rehabilitation

## 2024-01-17 ENCOUNTER — Encounter: Attending: Physical Medicine and Rehabilitation | Admitting: Physical Medicine and Rehabilitation

## 2024-01-17 VITALS — BP 115/83 | HR 88 | Ht 63.0 in | Wt 176.8 lb

## 2024-01-17 DIAGNOSIS — G629 Polyneuropathy, unspecified: Secondary | ICD-10-CM | POA: Diagnosis not present

## 2024-01-17 MED ORDER — CAPSAICIN-CLEANSING GEL 8 % EX KIT
4.0000 | PACK | Freq: Once | CUTANEOUS | Status: AC
  Administered 2024-01-17: 4 via TOPICAL

## 2024-01-17 MED ORDER — GABAPENTIN 100 MG PO CAPS: 100.0000 mg | ORAL_CAPSULE | Freq: Three times a day (TID) | ORAL | 3 refills | Status: AC

## 2024-01-17 NOTE — Progress Notes (Signed)
 -  Discussed Qutenza as an option for neuropathic pain control. Discussed that this is a capsaicin patch, stronger than capsaicin cream. Discussed that it is currently approved for diabetic peripheral neuropathy and post-herpetic neuralgia, but that it has also shown benefit in treating other forms of neuropathy. Provided patient with link to site to learn more about the patch: https://www.clark.biz/. Discussed that the patch would be placed in office and benefits usually last 3 months. Discussed that unintended exposure to capsaicin can cause severe irritation of eyes, mucous membranes, respiratory tract, and skin, but that Qutenza is a local treatment and does not have the systemic side effects of other nerve medications. Discussed that there may be pain, itching, erythema, and decreased sensory function associated with the application of Qutenza. Side effects usually subside within 1 week. A cold pack of analgesic medications can help with these side effects. Blood pressure can also be increased due to pain associated with administration of the patch.   4 patches of Qutenza [phone]) was applied to bilateral feet. Ice packs were applied during the procedure to ensure patient comfort. Blood pressure was monitored every 15 minutes. The patient tolerated the procedure well. Post-procedure instructions were given and follow-up has been scheduled.  Topical system measures 14cm x20cm (280cm for a total 1120units) were applied which will cause deeper penetration for destruction of the peripheral nerve using a chemical (Qutenza) which infuses into the skin like an injection and heat technique (occlusive, compressive dressing cauing endothermic heat technique)

## 2024-01-19 ENCOUNTER — Encounter: Payer: Self-pay | Admitting: Family Medicine

## 2024-01-19 ENCOUNTER — Ambulatory Visit (INDEPENDENT_AMBULATORY_CARE_PROVIDER_SITE_OTHER): Admitting: Family Medicine

## 2024-01-19 VITALS — BP 126/76 | HR 87 | Resp 16 | Ht 63.0 in | Wt 175.7 lb

## 2024-01-19 DIAGNOSIS — G9332 Myalgic encephalomyelitis/chronic fatigue syndrome: Secondary | ICD-10-CM | POA: Diagnosis not present

## 2024-01-19 DIAGNOSIS — Z23 Encounter for immunization: Secondary | ICD-10-CM | POA: Diagnosis not present

## 2024-01-19 DIAGNOSIS — J4540 Moderate persistent asthma, uncomplicated: Secondary | ICD-10-CM | POA: Insufficient documentation

## 2024-01-19 DIAGNOSIS — I10 Essential (primary) hypertension: Secondary | ICD-10-CM | POA: Diagnosis not present

## 2024-01-19 DIAGNOSIS — J4489 Other specified chronic obstructive pulmonary disease: Secondary | ICD-10-CM | POA: Diagnosis not present

## 2024-01-19 DIAGNOSIS — J3089 Other allergic rhinitis: Secondary | ICD-10-CM

## 2024-01-19 DIAGNOSIS — K219 Gastro-esophageal reflux disease without esophagitis: Secondary | ICD-10-CM

## 2024-01-19 DIAGNOSIS — J302 Other seasonal allergic rhinitis: Secondary | ICD-10-CM

## 2024-01-19 DIAGNOSIS — G5793 Unspecified mononeuropathy of bilateral lower limbs: Secondary | ICD-10-CM

## 2024-01-19 DIAGNOSIS — Z8639 Personal history of other endocrine, nutritional and metabolic disease: Secondary | ICD-10-CM

## 2024-01-19 DIAGNOSIS — U099 Post covid-19 condition, unspecified: Secondary | ICD-10-CM

## 2024-01-19 DIAGNOSIS — E890 Postprocedural hypothyroidism: Secondary | ICD-10-CM

## 2024-01-19 MED ORDER — MONTELUKAST SODIUM 10 MG PO TABS
10.0000 mg | ORAL_TABLET | Freq: Every day | ORAL | 1 refills | Status: AC
Start: 2024-01-19 — End: ?

## 2024-01-19 MED ORDER — AMLODIPINE BESYLATE 2.5 MG PO TABS: 2.5000 mg | ORAL_TABLET | Freq: Every day | ORAL | 1 refills | Status: DC

## 2024-01-19 MED ORDER — HYDROCHLOROTHIAZIDE 12.5 MG PO TABS: 12.5000 mg | ORAL_TABLET | Freq: Every day | ORAL | 1 refills | Status: DC

## 2024-01-19 MED ORDER — WEGOVY 2.4 MG/0.75ML ~~LOC~~ SOAJ: 2.4000 mg | SUBCUTANEOUS | 0 refills | Status: DC

## 2024-01-19 NOTE — Progress Notes (Signed)
 Name: Catherine Berg   MRN: [id number]    DOB: 18-Nov-1980   Date:01/19/2024       Progress Note  Subjective  Chief Complaint  Chief Complaint  Patient presents with   Medical Management of Chronic Issues   Discussed the use of AI scribe software for clinical note transcription with the patient, who gave verbal consent to proceed.  History of Present Illness Catherine Berg is a 43 year old female who presents for follow-up after breast reduction surgery and management of multiple chronic conditions.  She underwent breast reduction surgery and experienced a postoperative infection at the incision site, which has since resolved. Her last follow-up with the surgical team was in August, and she is satisfied with the outcome, although it took time to adjust.  She has a history of COPD asthma, managed with daily Dulera  and as-needed albuterol . She experiences shortness of breath with exertion and occasional cough. She also has seasonal allergies, for which she takes montelukast .  She reports a history of memory loss and mental fog following COVID-19, which persists intermittently. She experiences fatigue and has a history of major depression, for which she sees a Veterinary surgeon and takes medications including hydroxyzine , BuSpar , Effexor , and trazodone .  She has hypothyroidism managed with levothyroxine  112 mcg daily, following radioactive ablation. She is monitored by her endocrinologist and reports improved bowel movements over the past two weeks after previous irregularities.  She has hypertension and takes amlodipine  and HCTZ. She occasionally experiences mild chest pain but has not required emergency intervention and very seldom palpitation .  She has neuropathy in her legs, managed by Dr. Lorilee, and takes gabapentin  100 mg. She also takes metformin , which she reports helps with neuropathy and brain fog.  She has a history of morbid obesity, with a BMI previously over 40, now reduced to 31.12.  She attributes her weight loss to Wegovy  2.4 mg, dietary changes, and increased physical activity. She reports minimal side effects from the medication.  She has chronic idiopathic  constipation and GERD, managed with omeprazole  20 mg ,Linzess   and lactulose  as needed.    Patient Active Problem List   Diagnosis Date Noted   Post op infection of left breast 04/29/2023   Benign hypertension 04/29/2023   Adult hypothyroidism 04/29/2023   Strain of muscle, fascia and tendon of the posterior muscle group at thigh level, right thigh, subsequent encounter 09/07/2022   MDD (major depressive disorder), recurrent episode, mild (HCC) 11/17/2021   Neuropathy involving both lower extremities 11/17/2021   Perennial allergic rhinitis with seasonal variation 11/17/2021   Chronic pain of both lower extremities 09/24/2020   Sleeps in sitting position due to orthopnea 07/02/2020   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   Vitamin D  deficiency 04/22/2020   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   Memory loss 02/28/2020   Physical deconditioning 02/28/2020   Dyspnea on exertion 02/28/2020   Neck pain, bilateral 02/28/2020   Muscle spasms of neck 02/28/2020   Arthralgia of hand 01/24/2019   Class 3 severe obesity due to excess calories without serious comorbidity in adult 01/24/2019   Palpitations 09/16/2018   Coronary artery calcification 09/16/2018   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   Bilateral leg edema 04/20/2017   Incidental lung nodule, > 3mm and < 8mm 03/31/2017   Opioid-induced constipation 11/07/2015   Hives 09/02/2015   Obesity, Class III, BMI 40-49.9 (morbid obesity) 08/15/2015   Anxiety 07/09/2015   Thyroid  nodule 02/01/2015   History of abnormal cervical Pap  smear    Allergy-induced asthma, mild intermittent, uncomplicated 10/09/2013    Past Surgical History:  Procedure Laterality Date   BREAST REDUCTION SURGERY Bilateral 04/13/2023   Procedure:  MAMMARY REDUCTION  (BREAST);  Surgeon: Waddell Leonce NOVAK, MD;  Location: [facility];  Service: Plastics;  Laterality: Bilateral;   BREAST SURGERY     CERVICAL POLYPECTOMY     CESAREAN SECTION     x 2   CHOLECYSTECTOMY     COLONOSCOPY WITH PROPOFOL  N/A 11/14/2018   Procedure: COLONOSCOPY WITH PROPOFOL ;  Surgeon: Janalyn Keene NOVAK, MD;  Location: [facility];  Service: Endoscopy;  Laterality: N/A;   ESOPHAGOGASTRODUODENOSCOPY (EGD) WITH PROPOFOL  N/A 11/14/2018   Procedure: ESOPHAGOGASTRODUODENOSCOPY (EGD) WITH PROPOFOL ;  Surgeon: Janalyn Keene NOVAK, MD;  Location: [facility];  Service: Endoscopy;  Laterality: N/A;  Latex allergy   GIVENS CAPSULE STUDY N/A 12/27/2018   Procedure: GIVENS CAPSULE STUDY;  Surgeon: Janalyn Keene NOVAK, MD;  Location: [facility];  Service: Endoscopy;  Laterality: N/A;   INCISION AND DRAINAGE OF WOUND Left 05/03/2023   Procedure: IRRIGATION AND DEBRIDEMENT WOUND;  Surgeon: Waddell Leonce NOVAK, MD;  Location: [facility];  Service: Plastics;  Laterality: Left;   REDUCTION MAMMAPLASTY Bilateral 04/2023    Family History  Problem Relation Age of Onset   Heart attack Mother 13   Hypertension Mother    Asthma Mother    Cancer Mother        laryngeal   Diabetes Mother        pre diabetic   Heart disease Mother    Mental illness Mother    Alcohol abuse Mother    Drug abuse Mother    Depression Mother    Anxiety disorder Mother    Bipolar disorder Mother    Learning disabilities Brother    ADD / ADHD Brother    Asthma Son    Hyperlipidemia Brother    Alcohol abuse Father    Heart disease Maternal Grandmother        heart attack, pacemaker   Heart attack Maternal Grandmother    Hypertension Maternal Grandmother    Hypertension Maternal Grandfather    Heart disease Paternal Grandmother        couple of major open heart surgeries, leaking valves   Hypertension Paternal Grandmother    Hypertension Paternal Grandfather     Breast cancer Neg Hx     Social History   Tobacco Use   Smoking status: Never   Smokeless tobacco: Never  Substance Use Topics   Alcohol use: No     Current Outpatient Medications:    amLODipine  (NORVASC ) 2.5 MG tablet, Take 1 tablet (2.5 mg total) by mouth daily., Disp: 90 tablet, Rfl: 0   bisacodyl  (DULCOLAX) 5 MG EC tablet, Take 1 tablet (5 mg total) by mouth daily as needed for moderate constipation., Disp: 30 tablet, Rfl: 1   [START ON 01/31/2024] busPIRone  (BUSPAR ) 5 MG tablet, Take 1 tablet (5 mg total) by mouth 2 (two) times daily., Disp: 180 tablet, Rfl: 0   fluconazole  (DIFLUCAN ) 150 MG tablet, Take 1 tablet (150 mg total) by mouth every other day., Disp: 3 tablet, Rfl: 0   fluticasone  (FLONASE ) 50 MCG/ACT nasal spray, Place 2 sprays into both nostrils daily., Disp: 1 g, Rfl: 5   gabapentin  (NEURONTIN ) 100 MG capsule, Take 1 capsule (100 mg total) by mouth 3 (three) times daily., Disp: 270 capsule, Rfl: 3   hydrochlorothiazide  (HYDRODIURIL ) 12.5 MG tablet, Take 1 tablet (12.5 mg  total) by mouth daily., Disp: 90 tablet, Rfl: 0   hydrOXYzine  (ATARAX ) 10 MG tablet, Take 10 mg by mouth daily as needed., Disp: , Rfl:    lactulose  (CHRONULAC ) 10 GM/15ML solution, Take 30 mLs (20 g total) by mouth 2 (two) times daily as needed for moderate constipation., Disp: 473 mL, Rfl: 0   levothyroxine  (SYNTHROID ) 112 MCG tablet, Take 112 mcg by mouth every morning., Disp: , Rfl:    linaclotide  (LINZESS ) 290 MCG CAPS capsule, Take 290 mcg by mouth daily before breakfast., Disp: , Rfl:    metFORMIN  (GLUCOPHAGE ) 500 MG tablet, Take 1 tablet (500 mg total) by mouth daily with breakfast. (Patient taking differently: Take 500 mg by mouth daily with breakfast. Takes for long covid neuropathy), Disp: 90 tablet, Rfl: 3   mometasone -formoterol  (DULERA ) 200-5 MCG/ACT AERO, Inhale 2 puffs into the lungs in the morning and at bedtime., Disp: 1 each, Rfl: 11   montelukast  (SINGULAIR ) 10 MG tablet, Take 1 tablet  (10 mg total) by mouth at bedtime., Disp: 90 tablet, Rfl: 1   omeprazole  (PRILOSEC) 20 MG capsule, Take 1 capsule (20 mg total) by mouth 2 (two) times daily before a meal., Disp: 90 capsule, Rfl: 3   Semaglutide -Weight Management (WEGOVY ) 2.4 MG/0.75ML SOAJ, Inject 2.4 mg into the skin once a week., Disp: 9 mL, Rfl: 0   topiramate  (TOPAMAX ) 100 MG tablet, Take 1 tablet (100 mg total) by mouth at bedtime., Disp: 90 tablet, Rfl: 3   traZODone  (DESYREL ) 100 MG tablet, Take 1 tablet (100 mg total) by mouth at bedtime as needed for sleep., Disp: 90 tablet, Rfl: 1   venlafaxine  XR (EFFEXOR -XR) 150 MG 24 hr capsule, Take 1 capsule (150 mg total) by mouth daily with breakfast., Disp: 30 capsule, Rfl: 3   venlafaxine  XR (EFFEXOR -XR) 75 MG 24 hr capsule, Take 1 capsule (75 mg total) by mouth daily with breakfast. Total of 225 mg daily, take along with 150 mg cap, Disp: 90 capsule, Rfl: 1   VENTOLIN  HFA 108 (90 Base) MCG/ACT inhaler, Inhale 2 puffs into the lungs every 4 (four) hours as needed for wheezing or shortness of breath., Disp: 18 g, Rfl: 10   Vitamin D , Ergocalciferol , (DRISDOL ) 1.25 MG (50000 UNIT) CAPS capsule, Take 1 capsule (50,000 Units total) by mouth every 7 (seven) days., Disp: 12 capsule, Rfl: 1  Allergies  Allergen Reactions   Latex Hives    (Balloons, condoms, underwear elastic, gloves)   Shellfish Allergy Hives    I personally reviewed active problem list, medication list, allergies, family history with the patient/caregiver today.   ROS  Ten systems reviewed and is negative except as mentioned in HPI    Objective Physical Exam VITALS: BP- 126/76 MEASUREMENTS: Weight- 175.7, BMI- 31.12. CONSTITUTIONAL: Patient appears well-developed and well-nourished. No distress. HEENT: Head atraumatic, normocephalic, neck supple. CARDIOVASCULAR: Normal rate, regular rhythm and normal heart sounds. No murmur heard. No BLE edema. PULMONARY: Effort normal and breath sounds normal. Lungs  clear to auscultation bilaterally. No respiratory distress. ABDOMINAL: There is no tenderness or distention. MUSCULOSKELETAL: Normal gait. Without gross motor or sensory deficit. PSYCHIATRIC: Patient has a normal mood and affect. Behavior is normal. Judgment and thought content normal.  Vitals:   01/19/24 1056  BP: 126/76  Pulse: 87  Resp: 16  SpO2: 99%  Weight: 175 lb 11.2 oz (79.7 kg)  Height: 5' 3 (1.6 m)    Body mass index is 31.12 kg/m.  Recent Results (from the past 2160 hours)  Nitric oxide   Status: None   Collection Time: 12/16/23  2:54 PM  Result Value Ref Range   Nitric Oxide  10       PHQ2/9:    01/19/2024   10:53 AM 11/02/2023    5:08 PM 10/26/2023    4:15 PM 10/15/2023    9:34 AM 10/13/2023   10:43 AM  Depression screen PHQ 2/9  Decreased Interest 0   1 0  Down, Depressed, Hopeless 0   1 0  PHQ - 2 Score 0   2 0  Altered sleeping 0    0  Tired, decreased energy 0    0  Change in appetite 0    0  Feeling bad or failure about yourself  0    0  Trouble concentrating 0    0  Moving slowly or fidgety/restless 0    0  Suicidal thoughts 0    0  PHQ-9 Score 0    0  Difficult doing work/chores Not difficult at all    Not difficult at all     Information is confidential and restricted. Go to Review Flowsheets to unlock data.    phq 9 is negative  Fall Risk:    01/19/2024   10:50 AM 10/15/2023    9:34 AM 10/13/2023   10:43 AM 07/28/2023    9:41 AM 07/12/2023   10:40 AM  Fall Risk   Falls in the past year? 0 0 0 0 0  Number falls in past yr: 0  0 0 0  Injury with Fall? 0  0 0 0  Risk for fall due to : No Fall Risks  No Fall Risks    Follow up Falls evaluation completed  Falls evaluation completed Falls evaluation completed       Assessment & Plan COPD with asthma Condition well controlled with Dulera . Symptoms managed with Dulera  and albuterol . Allergy-related due to seasonal allergies. - Continue Dulera  daily. - Prescribe montelukast  for asthma and  allergies. - Use albuterol  as needed for acute symptoms.  Chronic allergic rhinitis with seasonal variation Managed with montelukast , aiding asthma control. - Prescribe montelukast  for allergic rhinitis and asthma.  Hypertension Well controlled with current medication. Occasional chest pain noted, cardiologist confirmed no cardiac issues. - Continue amlodipine  2.5 mg. - Continue HCTZ. - Instruct to call 911 if chest pain is accompanied by shortness of breath, jaw or arm pain, or if it does not resolve within 5 minutes.  Obesity Managed with Wegovy  and lifestyle modifications. Significant weight and BMI reduction. No significant side effects except occasional diarrhea. - Continue Wegovy  2.4 mg. - Encourage continued lifestyle modifications including portion control, increased water  intake, and regular exercise.  Chronic fatigue following COVID-19 Symptoms persist with variability.  Peripheral neuropathy, lower extremities Managed by Dr. Candia. Metformin  used for neuropathy and brain fog. Gabapentin  started. - Continue metformin  for neuropathy and brain fog. - Discontinue Topamax . - Start gabapentin  100 mg.  Major depressive disorder, single episode, unspecified Managed with multiple medications. Under care of counselor and Dr. Vickey. - Continue current medications for depression. - Continue counseling with Tawni Husband and follow-up with Dr. Vickey.  Hypothyroidism, post-ablation Managed with levothyroxine . No recent symptoms of tachycardia, hair loss, or bowel changes. - Continue levothyroxine  112 mcg. - Monitor thyroid  function tests regularly.  Chronic idiopathic constipation and gastroesophageal reflux disease Managed with omeprazole  and , Linzess  , lactulose . Recent improvement in bowel movements. - Continue omeprazole  20 mg. - Use lactulose  as needed for constipation.  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

## 2024-01-19 NOTE — Addendum Note (Signed)
 Addended by: GLENARD MIRE F on: 01/19/2024 12:50 PM   Modules accepted: Level of Service

## 2024-01-21 ENCOUNTER — Other Ambulatory Visit (HOSPITAL_COMMUNITY): Payer: Self-pay

## 2024-01-21 ENCOUNTER — Telehealth: Payer: Self-pay | Admitting: Pharmacy Technician

## 2024-01-21 NOTE — Telephone Encounter (Signed)
 FYI

## 2024-01-21 NOTE — Telephone Encounter (Signed)
 Pharmacy Patient Advocate Encounter   Received notification from CoverMyMeds that prior authorization for Wegovy  2.4MG /0.75ML auto-injectors is required/requested.   Insurance verification completed.   The patient is insured through [facility] MEDICAID .   Per test claim: Effective October 1st, Medicaid will discontinue coverage of GLP1 medications for weight loss (such as Wegovy  and Zepbound), unless the patient has a documented history of a heart attack or stroke. Zepbound will continue to be covered only for patients with moderate to severe sleep apnea (AHI 15-30). Because of this change, the prior authorization team will not be submitting new PA requests for GLP1 medications prescribed for weight loss between now and October 1st, as patients will be unable to continue therapy under Medicaid coverage.

## 2024-01-24 NOTE — Telephone Encounter (Signed)
 Because of this change, the prior authorization team will not be submitting new PA requests for GLP1 medications prescribed for weight loss between now and October 1st, as patients will be unable to continue therapy under Medicaid coverage.

## 2024-01-25 ENCOUNTER — Other Ambulatory Visit (HOSPITAL_COMMUNITY): Payer: Self-pay

## 2024-01-25 NOTE — Telephone Encounter (Signed)
 Good morning Catherine Berg, I called [name] insurance and because she received 3 months in July it was also refill too soon even though her dose was increased, they will not budge because of that October 1st change.

## 2024-01-26 ENCOUNTER — Telehealth: Payer: Self-pay

## 2024-01-26 ENCOUNTER — Encounter: Payer: Self-pay | Admitting: Family Medicine

## 2024-01-26 NOTE — Telephone Encounter (Signed)
 Patient is persistent and upset if they deny please make sure we have documentation from insurance company

## 2024-01-26 NOTE — Telephone Encounter (Signed)
 Patient states took her last injection last Saturday so she is due for refill and PA prior to OCT 1.  Please try  The prior authorization department from my insurance is saying that you can submit a prior authorization up until 9/30. I am approved until that date. The coverage stops on 10/1. I just got off the phone with them.

## 2024-01-26 NOTE — Telephone Encounter (Unsigned)
 Copied from CRM [id number]. Topic: General - Other >> Jan 26, 2024  1:56 PM Charlet HERO wrote: Reason for CRM: Patient would like Office Manager to call her at [phone]

## 2024-01-27 ENCOUNTER — Other Ambulatory Visit (HOSPITAL_COMMUNITY): Payer: Self-pay

## 2024-01-27 NOTE — Telephone Encounter (Signed)
 Good morning Catherine Berg, finally got this straightened out!! [facility] is filling her prescription for wegovy  2.4mg /0.27ml this morning for 3 months and her copay will be $4.00  Have a good day!

## 2024-02-02 ENCOUNTER — Ambulatory Visit: Admitting: Licensed Clinical Social Worker

## 2024-02-08 ENCOUNTER — Ambulatory Visit: Admitting: Licensed Clinical Social Worker
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
## 2024-02-15 ENCOUNTER — Other Ambulatory Visit: Payer: Self-pay

## 2024-02-15 ENCOUNTER — Ambulatory Visit (INDEPENDENT_AMBULATORY_CARE_PROVIDER_SITE_OTHER): Admitting: Psychiatry

## 2024-02-15 ENCOUNTER — Encounter: Payer: Self-pay | Admitting: Psychiatry

## 2024-02-15 VITALS — BP 112/77 | HR 72 | Temp 97.6°F | Ht 63.0 in | Wt 173.4 lb

## 2024-02-15 DIAGNOSIS — F411 Generalized anxiety disorder: Secondary | ICD-10-CM

## 2024-02-15 DIAGNOSIS — F4310 Post-traumatic stress disorder, unspecified: Secondary | ICD-10-CM

## 2024-02-15 DIAGNOSIS — G4700 Insomnia, unspecified: Secondary | ICD-10-CM

## 2024-02-15 DIAGNOSIS — F3341 Major depressive disorder, recurrent, in partial remission: Secondary | ICD-10-CM

## 2024-02-15 NOTE — Patient Instructions (Signed)
 Continue venlafaxine  225 mg daily   Continue Buspar  5 mg twice a day  Continue trazodone  100 mg at night as needed for insomnia Continue hydroxyzine  10 mg daily as needed for anxiety  Next appointment: 12/2 at 8 am

## 2024-02-22 ENCOUNTER — Ambulatory Visit (INDEPENDENT_AMBULATORY_CARE_PROVIDER_SITE_OTHER): Admitting: Licensed Clinical Social Worker

## 2024-02-22 DIAGNOSIS — F411 Generalized anxiety disorder: Secondary | ICD-10-CM | POA: Diagnosis not present

## 2024-02-22 DIAGNOSIS — F4310 Post-traumatic stress disorder, unspecified: Secondary | ICD-10-CM | POA: Diagnosis not present

## 2024-02-22 DIAGNOSIS — F3341 Major depressive disorder, recurrent, in partial remission: Secondary | ICD-10-CM
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
## 2024-02-23 ENCOUNTER — Other Ambulatory Visit: Payer: Self-pay | Admitting: Medical Genetics

## 2024-02-23 DIAGNOSIS — Z006 Encounter for examination for normal comparison and control in clinical research program: Secondary | ICD-10-CM

## 2024-03-09 ENCOUNTER — Ambulatory Visit (INDEPENDENT_AMBULATORY_CARE_PROVIDER_SITE_OTHER): Admitting: Licensed Clinical Social Worker

## 2024-03-09 DIAGNOSIS — F3341 Major depressive disorder, recurrent, in partial remission: Secondary | ICD-10-CM

## 2024-03-09 DIAGNOSIS — F4310 Post-traumatic stress disorder, unspecified: Secondary | ICD-10-CM | POA: Diagnosis not present

## 2024-03-09 DIAGNOSIS — F411 Generalized anxiety disorder: Secondary | ICD-10-CM

## 2024-03-09 NOTE — Progress Notes (Signed)
 THERAPIST PROGRESS NOTE  Session Time: 8:03-9am  Participation Level: Active  Behavioral Response: CasualAlertEuthymic  Type of Therapy: Individual Therapy  Treatment Goals addressed:  Active     BH CCP Acute or Chronic Trauma Reaction     LTG: Recall traumatic events without becoming overwhelmed with negative emotions (Progressing)     Start:  03/17/23    Expected End:  05/24/24         LTG: Pt identifies she would like to process her trauma hx (Progressing)     Start:  03/17/23    Expected End:  05/24/24         LTG: I want to be where the things I don't have control over, I can learn to let go.  (Progressing)     Start:  03/17/23    Expected End:  05/24/24         STG: Catherine Berg will identify internal and external stimuli that trigger PTSD symptoms (Progressing)     Start:  03/17/23    Expected End:  05/24/24         Provide Catherine Berg with education on trauma-oriented therapy     Start:  03/17/23         Educate Catherine Berg on common reactions to a traumatic experience     Start:  03/17/23         Educate Catherine Berg that exposure to trauma may result in brain and hormonal changes that can lead to difficulties with memory, learning, emotional regulation, poor impulse control, or depression that can persist     Start:  03/17/23         Encourage Catherine Berg to identify 3 trauma related cognitive distortions     Start:  03/17/23         Coping Skills      Start:  03/17/23      Will Work with patient to decrease the frequency of negative self-descriptive statements and increase the frequency of positive self- descriptive statements using CBT/DBT/REBT techniques per patient self report 3 out of 5 documented sessions. Some of the techniques that will be used will be CBT, positive affirmations, role playing, modeling, homework and journaling.          OP Depression     LTG: Reduce frequency, intensity, and duration of depression symptoms so that daily functioning is  improved (Progressing)     Start:  03/17/23    Expected End:  05/24/24       Goal Note     02/22/24: shares someday's she thinks about doing things to better her health but does not have motivation.          STG: Catherine Berg will identify cognitive patterns and beliefs that support depression (Progressing)     Start:  03/17/23    Expected End:  05/24/24         LTG: I just want to believe that I am okay to keep pushing and thriving. Not allow things to have control over you. (Progressing)     Start:  03/17/23    Expected End:  05/24/24       Goal Note     02/22/24: Patient reports she has been noticing improvements in her ability to focus on controllable factors and giver herself grace. Shares current stressors lead her to feel misplaced guilt.          Work with Catherine Berg to identify the major components of a recent episode of depression: physical symptoms, major thoughts and images, and major  behaviors they experienced     Start:  03/17/23         Catherine Berg will identify 3 trauma related cognitive distortions     Start:  03/17/23         Catherine Berg will identify 3 cognitive distortions they are currently using and write reframing statements to replace them     Start:  03/17/23         Catherine Berg will review pleasant activities list and select 2 activities to practice weekly for the next 8 weeks     Start:  03/17/23            ProgressTowards Goals: Progressing  Interventions: Supportive, Reframing, and Other: EMDR  Summary: Catherine Berg is a 43 y.o. female who presents with anxiety. Patient identifies symptoms to include uncontrollable worry, negative self affect, low mood, anxious feelings, tearfulness, avoidance, restlessness. Pt was oriented times 5. Pt was cooperative and engaged. Pt denies SI/HI/AVH.    Patient utilized therapeutic space to process recent stressors related to parenting and feeling the pressure to be the perfect parent.  Clinician worked with patient  to challenge his negative cognitions and reflected on the impact her childhood has had on these personal feelings.  In addition, the patient continued EMDR by processing her Touchstone memory.  Patient became tearful reflecting on the implications to feeling unsupported and unprotected when separated from her brother.  Patient reports subjective units of distress decreased from a score of 8 to a score of 0.  Clinician and patient utilize inner child work to address parenting her 14-year-old self.  Patient instilled the sense of safety and trust with herself.  Instilled the adaptive belief I can control what I can and accept this.  Reflected on ways she can begin to police her own unhealthy outlets for control outside of therapy.  Suicidal/Homicidal: Nowithout intent/plan  Therapist Response:  Cln utilized active and supportive reflection to create a safe environment for patient to process recent life stressors.  Assess for current symptoms, stressors, safety since last session.  Utilize EMDR to process touchstone memory on target sequence plan.  Plan: Return again in 3 weeks.  Diagnosis: PTSD (post-traumatic stress disorder)  MDD (major depressive disorder), recurrent, in partial remission  GAD (generalized anxiety disorder)   Collaboration of Care: : AEB psychiatrist can access notes and cln. Will review psychiatrists' notes. Check in with the patient and will see LCSW per availability. Patient agreed with treatment recommendations.   Patient/Guardian was advised Release of Information must be obtained prior to any record release in order to collaborate their care with an outside provider. Patient/Guardian was advised if they have not already done so to contact the registration department to sign all necessary forms in order for us  to release information regarding their care.   Consent: Patient/Guardian gives verbal consent for treatment and assignment of benefits for services provided during  this visit. Patient/Guardian expressed understanding and agreed to proceed.   Catherine KATHEE Husband, LCSW 03/09/2024

## 2024-03-16 ENCOUNTER — Ambulatory Visit: Admitting: Licensed Clinical Social Worker
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
## 2024-03-27 NOTE — Patient Instructions (Signed)
 Preventive Care 44-44 Years Old, Female  Preventive care refers to lifestyle choices and visits with your health care provider that can promote health and wellness. Preventive care visits are also called wellness exams.  What can I expect for my preventive care visit?  Counseling  Your health care provider may ask you questions about your:  Medical history, including:  Past medical problems.  Family medical history.  Pregnancy history.  Current health, including:  Menstrual cycle.  Method of birth control.  Emotional well-being.  Home life and relationship well-being.  Sexual activity and sexual health.  Lifestyle, including:  Alcohol, nicotine or tobacco, and drug use.  Access to firearms.  Diet, exercise, and sleep habits.  Work and work Astronomer.  Sunscreen use.  Safety issues such as seatbelt and bike helmet use.  Physical exam  Your health care provider will check your:  Height and weight. These may be used to calculate your BMI (body mass index). BMI is a measurement that tells if you are at a healthy weight.  Waist circumference. This measures the distance around your waistline. This measurement also tells if you are at a healthy weight and may help predict your risk of certain diseases, such as type 2 diabetes and high blood pressure.  Heart rate and blood pressure.  Body temperature.  Skin for abnormal spots.  What immunizations do I need?    Vaccines are usually given at various ages, according to a schedule. Your health care provider will recommend vaccines for you based on your age, medical history, and lifestyle or other factors, such as travel or where you work.  What tests do I need?  Screening  Your health care provider may recommend screening tests for certain conditions. This may include:  Lipid and cholesterol levels.  Diabetes screening. This is done by checking your blood sugar (glucose) after you have not eaten for a while (fasting).  Pelvic exam and Pap test.  Hepatitis B test.  Hepatitis C  test.  HIV (human immunodeficiency virus) test.  STI (sexually transmitted infection) testing, if you are at risk.  Lung cancer screening.  Colorectal cancer screening.  Mammogram. Talk with your health care provider about when you should start having regular mammograms. This may depend on whether you have a family history of breast cancer.  BRCA-related cancer screening. This may be done if you have a family history of breast, ovarian, tubal, or peritoneal cancers.  Bone density scan. This is done to screen for osteoporosis.  Talk with your health care provider about your test results, treatment options, and if necessary, the need for more tests.  Follow these instructions at home:  Eating and drinking    Eat a diet that includes fresh fruits and vegetables, whole grains, lean protein, and low-fat dairy products.  Take vitamin and mineral supplements as recommended by your health care provider.  Do not drink alcohol if:  Your health care provider tells you not to drink.  You are pregnant, may be pregnant, or are planning to become pregnant.  If you drink alcohol:  Limit how much you have to 0-1 drink a day.  Know how much alcohol is in your drink. In the [place] one drink equals one 12 oz bottle of beer (355 mL), one 5 oz glass of wine (148 mL), or one 1 oz glass of hard liquor (44 mL).  Lifestyle  Brush your teeth every morning and night with fluoride toothpaste. Floss one time each day.  Exercise for at least  30 minutes 5 or more days each week.  Do not use any products that contain nicotine or tobacco. These products include cigarettes, chewing tobacco, and vaping devices, such as e-cigarettes. If you need help quitting, ask your health care provider.  Do not use drugs.  If you are sexually active, practice safe sex. Use a condom or other form of protection to prevent STIs.  If you do not wish to become pregnant, use a form of birth control. If you plan to become pregnant, see your health care provider for a  prepregnancy visit.  Take aspirin only as told by your health care provider. Make sure that you understand how much to take and what form to take. Work with your health care provider to find out whether it is safe and beneficial for you to take aspirin daily.  Find healthy ways to manage stress, such as:  Meditation, yoga, or listening to music.  Journaling.  Talking to a trusted person.  Spending time with friends and family.  Minimize exposure to UV radiation to reduce your risk of skin cancer.  Safety  Always wear your seat belt while driving or riding in a vehicle.  Do not drive:  If you have been drinking alcohol. Do not ride with someone who has been drinking.  When you are tired or distracted.  While texting.  If you have been using any mind-altering substances or drugs.  Wear a helmet and other protective equipment during sports activities.  If you have firearms in your house, make sure you follow all gun safety procedures.  Seek help if you have been physically or sexually abused.  What's next?  Visit your health care provider once a year for an annual wellness visit.  Ask your health care provider how often you should have your eyes and teeth checked.  Stay up to date on all vaccines.  This information is not intended to replace advice given to you by your health care provider. Make sure you discuss any questions you have with your health care provider.  Document Revised: 10/16/2020 Document Reviewed: 10/16/2020  Elsevier Patient Education  2024 [employer].

## 2024-03-28 ENCOUNTER — Ambulatory Visit (INDEPENDENT_AMBULATORY_CARE_PROVIDER_SITE_OTHER): Admitting: Family Medicine

## 2024-03-28 VITALS — BP 132/80 | HR 88 | Resp 16 | Ht 61.81 in | Wt 164.1 lb

## 2024-03-28 DIAGNOSIS — R748 Abnormal levels of other serum enzymes: Secondary | ICD-10-CM

## 2024-03-28 DIAGNOSIS — Z862 Personal history of diseases of the blood and blood-forming organs and certain disorders involving the immune mechanism: Secondary | ICD-10-CM

## 2024-03-28 DIAGNOSIS — Z01419 Encounter for gynecological examination (general) (routine) without abnormal findings: Secondary | ICD-10-CM

## 2024-03-28 DIAGNOSIS — M7989 Other specified soft tissue disorders: Secondary | ICD-10-CM

## 2024-03-28 DIAGNOSIS — Z79899 Other long term (current) drug therapy: Secondary | ICD-10-CM

## 2024-03-28 DIAGNOSIS — E559 Vitamin D deficiency, unspecified: Secondary | ICD-10-CM

## 2024-03-28 DIAGNOSIS — Z131 Encounter for screening for diabetes mellitus: Secondary | ICD-10-CM

## 2024-03-28 DIAGNOSIS — N6320 Unspecified lump in the left breast, unspecified quadrant: Secondary | ICD-10-CM

## 2024-03-28 DIAGNOSIS — N63 Unspecified lump in unspecified breast: Secondary | ICD-10-CM

## 2024-03-28 DIAGNOSIS — E538 Deficiency of other specified B group vitamins: Secondary | ICD-10-CM

## 2024-03-28 NOTE — Progress Notes (Unsigned)
 Catherine Berg

## 2024-03-28 NOTE — Progress Notes (Signed)
 Name: Catherine Berg   MRN: [id number]    DOB: 1980-05-07   Date:03/28/2024       Progress Note  Subjective  Chief Complaint  Chief Complaint  Patient presents with   Annual Exam    HPI  Patient presents for annual CPE.  Discussed the use of AI scribe software for clinical note transcription with the patient, who gave verbal consent to proceed.  History of Present Illness Catherine Berg is a 43 year old female who presents for an annual physical exam.  She has a history of iron deficiency anemia, low vitamin D  and B12 levels, and elevated liver enzymes. She reports a history of iron deficiency anemia, but her last CBC did not show anemia. Her thyroid , sugar, kidney, and liver function tests were previously checked, with liver enzymes noted to be elevated and protein levels low.  She has been on Wegovy  for weight loss and has successfully reduced her weight from 175 lbs to 164 lbs, with a current BMI of 30.2. She experiences anxiety about maintaining her weight loss after stopping Wegovy  in three weeks. She is also on metformin .  She has not been sexually active for about three years and attributes this to a lack of mental desire, despite no physical pain during intercourse. She is currently using Nexplanon  for birth control, which she has had for several years. She has considered discontinuing it due to concerns about potential weight gain and its impact on her libido.  She experiences occasional chest pain, palpitations, nausea, and shortness of breath, particularly after working out twice a week. She feels weak and off-balance post-exercise, which she attributes to inadequate nutrition and hydration.  She reports urinary urgency, particularly when she cannot reach the bathroom in time. Caffeine and holding urine for extended periods exacerbate this issue.  She has a history of breast surgery with some post-operative pain and lumps, which she is monitoring. She is concerned about scar  tissue and potential cysts.  She has a brother who is her emergency contact and makes medical decisions for her if she is unable to do so. She does not have a living will but has communicated her wishes to her brother.      Diet: small portions Exercise: working out twice a week   Last Eye Exam: completed Last Dental Exam: completed  Flowsheet Row Office Visit from 03/28/2024 in [facility]  AUDIT-C Score 0   Depression: Phq 9 is  negative    03/28/2024    2:25 PM 01/19/2024   10:53 AM 11/02/2023    5:08 PM 10/26/2023    4:15 PM 10/15/2023    9:34 AM  Depression screen PHQ 2/9  Decreased Interest 0 0   1  Down, Depressed, Hopeless 0 0   1  PHQ - 2 Score 0 0   2  Altered sleeping  0     Tired, decreased energy  0     Change in appetite  0     Feeling bad or failure about yourself   0     Trouble concentrating  0     Moving slowly or fidgety/restless  0     Suicidal thoughts  0     PHQ-9 Score  0      Difficult doing work/chores  Not difficult at all        Information is confidential and restricted. Go to Review Flowsheets to unlock data.   Data saved with a previous flowsheet  row definition   Hypertension: BP Readings from Last 3 Encounters:  03/28/24 132/80  01/19/24 126/76  01/17/24 115/83   Obesity: Wt Readings from Last 3 Encounters:  03/28/24 164 lb 1.6 oz (74.4 kg)  01/19/24 175 lb 11.2 oz (79.7 kg)  01/17/24 176 lb 12.8 oz (80.2 kg)   BMI Readings from Last 3 Encounters:  03/28/24 30.20 kg/m  01/19/24 31.12 kg/m  01/17/24 31.32 kg/m     Vaccines: reviewed with the patient.   Hep C Screening: completed STD testing and prevention (HIV/chl/gon/syphilis): N/A Intimate partner violence: negative screen  Sexual History :not sexually active  Menstrual History/LMP/Abnormal Bleeding: no cycles, Nexplanon   Discussed importance of follow up if any post-menopausal bleeding: not applicable  Incontinence Symptoms: sometimes has  urinary urgency   Breast cancer:  - Last Mammogram: up to date  - BRCA gene screening: N/A  Osteoporosis Prevention : Discussed high calcium and vitamin D  supplementation, weight bearing exercises Bone density :not applicable   Cervical cancer screening: up-to-date  Skin cancer: Discussed monitoring for atypical lesions  Colorectal cancer: start next year    Lung cancer:  Low Dose CT Chest recommended if Age 35-80 years, 20 pack-year currently smoking OR have quit w/in 15years. Patient does not qualify for screen   ECG: 2024  Advanced Care Planning: A voluntary discussion about advance care planning including the explanation and discussion of advance directives.  Discussed health care proxy and Living will, and the patient was able to identify a health care proxy as brother .  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   Memory loss 02/28/2020   Arthralgia of hand 01/24/2019   Coronary artery calcification 09/16/2018   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   Incidental lung nodule, > 3mm and < 8mm 03/31/2017   Opioid-induced constipation 11/07/2015   Hives 09/02/2015   Anxiety 07/09/2015   Thyroid  nodule 02/01/2015   History of abnormal cervical Pap smear    Allergy-induced asthma, mild intermittent, uncomplicated  10/09/2013    Past Surgical History:  Procedure Laterality Date   BREAST REDUCTION SURGERY Bilateral 04/13/2023   Procedure: MAMMARY REDUCTION  (BREAST);  Surgeon: Waddell Leonce NOVAK, MD;  Location: [facility];  Service: Plastics;  Laterality: Bilateral;   BREAST SURGERY     CERVICAL POLYPECTOMY     CESAREAN SECTION     x 2   CHOLECYSTECTOMY     COLONOSCOPY WITH PROPOFOL  N/A 11/14/2018   Procedure: COLONOSCOPY WITH PROPOFOL ;  Surgeon: Janalyn Keene NOVAK, MD;  Location: [facility];  Service: Endoscopy;  Laterality: N/A;   ESOPHAGOGASTRODUODENOSCOPY (EGD) WITH PROPOFOL  N/A 11/14/2018   Procedure: ESOPHAGOGASTRODUODENOSCOPY (EGD) WITH PROPOFOL ;  Surgeon: Janalyn Keene NOVAK, MD;  Location: [facility];  Service: Endoscopy;  Laterality: N/A;  Latex allergy   GIVENS CAPSULE STUDY N/A 12/27/2018   Procedure: GIVENS CAPSULE STUDY;  Surgeon: Janalyn Keene  B, MD;  Location: [facility];  Service: Endoscopy;  Laterality: N/A;   INCISION AND DRAINAGE OF WOUND Left 05/03/2023   Procedure: IRRIGATION AND DEBRIDEMENT WOUND;  Surgeon: Waddell Leonce NOVAK, MD;  Location: [facility];  Service: Plastics;  Laterality: Left;   REDUCTION MAMMAPLASTY Bilateral 04/2023    Family History  Problem Relation Age of Onset   Heart attack Mother 9   Hypertension Mother    Asthma Mother    Cancer Mother        laryngeal   Diabetes Mother        pre diabetic   Heart disease Mother    Mental illness Mother    Alcohol abuse Mother    Drug abuse Mother    Depression Mother    Anxiety disorder Mother    Bipolar disorder Mother    COPD Mother    Early death Mother    Learning disabilities Brother    ADD / ADHD Brother    Asthma Son    Hyperlipidemia Brother    Alcohol abuse Father    Heart disease Maternal Grandmother        heart attack, pacemaker   Heart attack Maternal Grandmother    Hypertension Maternal Grandmother    Stroke Maternal Grandmother    Hypertension  Maternal Grandfather    Heart disease Paternal Grandmother        couple of major open heart surgeries, leaking valves   Hypertension Paternal Grandmother    Hypertension Paternal Grandfather    Breast cancer Neg Hx     Social History   Socioeconomic History   Marital status: Single    Spouse name: Not on file   Number of children: 2   Years of education: 11   Highest education level: 11th grade  Occupational History   Not on file  Tobacco Use   Smoking status: Never   Smokeless tobacco: Never  Vaping Use   Vaping status: Never Used  Substance and Sexual Activity   Alcohol use: Never   Drug use: Never   Sexual activity: Not Currently    Partners: Male    Birth control/protection: Implant    Comment: last sex 3 years ago  Other Topics Concern   Not on file  Social History Narrative   Not on file   Social Drivers of Health   Financial Resource Strain: Medium Risk (03/28/2024)   Overall Financial Resource Strain (CARDIA)    Difficulty of Paying Living Expenses: Somewhat hard  Food Insecurity: Food Insecurity Present (03/28/2024)   Hunger Vital Sign    Worried About Running Out of Food in the Last Year: Sometimes true    Ran Out of Food in the Last Year: Never true  Transportation Needs: No Transportation Needs (03/28/2024)   PRAPARE - Administrator, Civil Service (Medical): No    Lack of Transportation (Non-Medical): No  Physical Activity: Insufficiently Active (03/28/2024)   Exercise Vital Sign    Days of Exercise per Week: 2 days    Minutes of Exercise per Session: 20 min  Stress: Stress Concern Present (03/28/2024)   [employer] of Occupational Health - Occupational Stress Questionnaire    Feeling of Stress: Rather much  Social Connections: Moderately Integrated (03/28/2024)   Social Connection and Isolation Panel    Frequency of Communication with Friends and Family: Three times a week    Frequency of Social Gatherings with Friends and  Family: Patient declined    Attends Religious Services: More than 4  times per year    Active Member of Clubs or Organizations: Yes    Attends Banker Meetings: More than 4 times per year    Marital Status: Never married  Intimate Partner Violence: Not At Risk (08/12/2023)   Humiliation, Afraid, Rape, and Kick questionnaire    Fear of Current or Ex-Partner: No    Emotionally Abused: No    Physically Abused: No    Sexually Abused: No     Current Outpatient Medications:    amLODipine  (NORVASC ) 2.5 MG tablet, Take 1 tablet (2.5 mg total) by mouth daily., Disp: 90 tablet, Rfl: 1   bisacodyl  (DULCOLAX) 5 MG EC tablet, Take 1 tablet (5 mg total) by mouth daily as needed for moderate constipation., Disp: 30 tablet, Rfl: 1   busPIRone  (BUSPAR ) 5 MG tablet, Take 1 tablet (5 mg total) by mouth 2 (two) times daily., Disp: 180 tablet, Rfl: 0   fluconazole  (DIFLUCAN ) 150 MG tablet, Take 1 tablet (150 mg total) by mouth every other day., Disp: 3 tablet, Rfl: 0   fluticasone  (FLONASE ) 50 MCG/ACT nasal spray, Place 2 sprays into both nostrils daily., Disp: 1 g, Rfl: 5   gabapentin  (NEURONTIN ) 100 MG capsule, Take 1 capsule (100 mg total) by mouth 3 (three) times daily., Disp: 270 capsule, Rfl: 3   hydrochlorothiazide  (HYDRODIURIL ) 12.5 MG tablet, Take 1 tablet (12.5 mg total) by mouth daily., Disp: 90 tablet, Rfl: 1   hydrOXYzine  (ATARAX ) 10 MG tablet, Take 10 mg by mouth daily as needed., Disp: , Rfl:    lactulose  (CHRONULAC ) 10 GM/15ML solution, Take 30 mLs (20 g total) by mouth 2 (two) times daily as needed for moderate constipation., Disp: 473 mL, Rfl: 0   levothyroxine  (SYNTHROID ) 112 MCG tablet, Take 112 mcg by mouth every morning., Disp: , Rfl:    linaclotide  (LINZESS ) 290 MCG CAPS capsule, Take 290 mcg by mouth daily before breakfast., Disp: , Rfl:    metFORMIN  (GLUCOPHAGE ) 500 MG tablet, Take 1 tablet (500 mg total) by mouth daily with breakfast. (Patient taking differently: Take 500 mg  by mouth daily with breakfast. Takes for long covid neuropathy), Disp: 90 tablet, Rfl: 3   mometasone -formoterol  (DULERA ) 200-5 MCG/ACT AERO, Inhale 2 puffs into the lungs in the morning and at bedtime., Disp: 1 each, Rfl: 11   montelukast  (SINGULAIR ) 10 MG tablet, Take 1 tablet (10 mg total) by mouth at bedtime., Disp: 90 tablet, Rfl: 1   omeprazole  (PRILOSEC) 20 MG capsule, Take 1 capsule (20 mg total) by mouth 2 (two) times daily before a meal., Disp: 90 capsule, Rfl: 3   semaglutide -weight management (WEGOVY ) 2.4 MG/0.75ML SOAJ SQ injection, Inject 2.4 mg into the skin once a week., Disp: 9 mL, Rfl: 0   traZODone  (DESYREL ) 100 MG tablet, Take 1 tablet (100 mg total) by mouth at bedtime as needed for sleep., Disp: 90 tablet, Rfl: 1   venlafaxine  XR (EFFEXOR -XR) 150 MG 24 hr capsule, Take 1 capsule (150 mg total) by mouth daily with breakfast., Disp: 30 capsule, Rfl: 3   venlafaxine  XR (EFFEXOR -XR) 75 MG 24 hr capsule, Take 1 capsule (75 mg total) by mouth daily with breakfast. Total of 225 mg daily, take along with 150 mg cap, Disp: 90 capsule, Rfl: 1   VENTOLIN  HFA 108 (90 Base) MCG/ACT inhaler, Inhale 2 puffs into the lungs every 4 (four) hours as needed for wheezing or shortness of breath., Disp: 18 g, Rfl: 10   Vitamin D , Ergocalciferol , (DRISDOL ) 1.25 MG (50000 UNIT) CAPS  capsule, Take 1 capsule (50,000 Units total) by mouth every 7 (seven) days., Disp: 12 capsule, Rfl: 1  Allergies  Allergen Reactions   Latex Hives    (Balloons, condoms, underwear elastic, gloves)   Shellfish Allergy Hives     ROS  Constitutional: Negative for fever or weight change.  Respiratory: Negative for cough and shortness of breath.   Cardiovascular: Negative for chest pain or palpitations.  Gastrointestinal: Negative for abdominal pain, no bowel changes.  Musculoskeletal: Negative for gait problem or joint swelling.  Skin: Negative for rash.  Neurological: Negative for dizziness or headache.  No other  specific complaints in a complete review of systems (except as listed in HPI above).   Objective  Vitals:   03/28/24 1427  BP: 132/80  Pulse: 88  Resp: 16  SpO2: 99%  Weight: 164 lb 1.6 oz (74.4 kg)  Height: 5' 1.81 (1.57 m)    Body mass index is 30.2 kg/m.  Physical Exam  Constitutional: Patient appears well-developed and well-nourished. No distress.  HENT: Head: Normocephalic and atraumatic. Ears: B TMs ok, no erythema or effusion; Nose: Nose normal. Mouth/Throat: Oropharynx is clear and moist. No oropharyngeal exudate.  Eyes: Conjunctivae and EOM are normal. Pupils are equal, round, and reactive to light. No scleral icterus.  Neck: Normal range of motion. Neck supple. No JVD present. No thyromegaly present.  Cardiovascular: Normal rate, regular rhythm and normal heart sounds.  No murmur heard. No BLE edema. Pulmonary/Chest: Effort normal and breath sounds normal. No respiratory distress. Abdominal: Soft. Bowel sounds are normal, no distension. There is no tenderness. no masses Breast: scar from mammoplasty, multiple round nodules on left breast, 9 oclock and 2oclockno nipple discharge or rashes FEMALE GENITALIA:  Not done  RECTAL: not done  Musculoskeletal: Normal range of motion, no joint effusions. No gross deformities Neurological: he is alert and oriented to person, place, and time. No cranial nerve deficit. Coordination, balance, strength, speech and gait are normal.  Skin: Skin is warm and dry. No rash noted. No erythema.  Psychiatric: Patient has a normal mood and affect. behavior is normal. Judgment and thought content normal.    Assessment & Plan Well Woman Visit Routine exam with Pap smear up to date. No STI screening needed due to no recent sexual activity. - Performed well woman exam. - Ordered vitamin D  and B12 with folic acid levels. - Ordered CBC. - Ordered lipid panel and diabetes screening.  Vitamin D  deficiency Previous levels as low as 10. Insurance  coverage issues noted. - Ordered vitamin D  level.  Vitamin B12 deficiency Previous low levels noted in April. - Ordered vitamin B12 level.  Obesity BMI 30.2. Weight loss from 175 lbs to 164 lbs since September. Currently on Wegovy  with plans to taper off due to insurance issues. Anxiety about maintaining weight loss without medication. - Taper Wegovy  from 2.4 mg every 10 days to 0.25 mg. - Continue metformin . - Encouraged dietary modifications and exercise.  Low HDL cholesterol Previous low HDL levels. Insurance coverage issues noted. - Ordered lipid panel.  Abnormal liver enzymes Previous abnormal liver enzymes noted. - Ordered liver function tests.  Urinary urgency Experiencing urgency, possibly exacerbated by caffeine. Weight loss may help alleviate symptoms. - Advised reducing caffeine intake. - Encouraged weight loss.  Breast pain and lumps, status post infection and surgery (left) Post-surgical changes with lumps and pain. Differential includes scar tissue or cysts. - Ordered diagnostic mammogram and ultrasound of the left breast.  Screening for diabetes mellitus Routine diabetes  screening as part of well woman exam. - Ordered diabetes screening.       -USPSTF grade A and B recommendations reviewed with patient; age-appropriate recommendations, preventive care, screening tests, etc discussed and encouraged; healthy living encouraged; see AVS for patient education given to patient -Discussed importance of 150 minutes of physical activity weekly, eat two servings of fish weekly, eat one serving of tree nuts ( cashews, pistachios, pecans, almonds.SABRA) every other day, eat 6 servings of fruit/vegetables daily and drink plenty of water  and avoid sweet beverages.   -Reviewed Health Maintenance: Yes.

## 2024-03-29 ENCOUNTER — Ambulatory Visit: Payer: Self-pay | Admitting: Family Medicine

## 2024-03-29 ENCOUNTER — Encounter: Payer: Self-pay | Admitting: Licensed Clinical Social Worker

## 2024-03-29 ENCOUNTER — Ambulatory Visit (INDEPENDENT_AMBULATORY_CARE_PROVIDER_SITE_OTHER): Admitting: Licensed Clinical Social Worker

## 2024-03-29 DIAGNOSIS — F3341 Major depressive disorder, recurrent, in partial remission: Secondary | ICD-10-CM | POA: Diagnosis not present

## 2024-03-29 DIAGNOSIS — F4310 Post-traumatic stress disorder, unspecified: Secondary | ICD-10-CM

## 2024-03-29 DIAGNOSIS — F411 Generalized anxiety disorder: Secondary | ICD-10-CM | POA: Diagnosis not present

## 2024-03-29 LAB — CBC WITH DIFFERENTIAL/PLATELET
Absolute Lymphocytes: 3276 {cells}/uL (ref 850–3900)
Absolute Monocytes: 422 {cells}/uL (ref 200–950)
Basophils Absolute: 32 {cells}/uL (ref 0–200)
Basophils Relative: 0.5 %
Eosinophils Absolute: 151 {cells}/uL (ref 15–500)
Eosinophils Relative: 2.4 %
HCT: 36.2 % (ref 35.9–46.0)
Hemoglobin: 12 g/dL (ref 11.7–15.5)
MCH: 29.1 pg (ref 27.0–33.0)
MCHC: 33.1 g/dL (ref 31.6–35.4)
MCV: 87.9 fL (ref 81.4–101.7)
MPV: 10.9 fL (ref 7.5–12.5)
Monocytes Relative: 6.7 %
Neutro Abs: 2419 {cells}/uL (ref 1500–7800)
Neutrophils Relative %: 38.4 %
Platelets: 320 Thousand/uL (ref 140–400)
RBC: 4.12 Million/uL (ref 3.80–5.10)
RDW: 13.3 % (ref 11.0–15.0)
Total Lymphocyte: 52 %
WBC: 6.3 Thousand/uL (ref 3.8–10.8)

## 2024-03-29 LAB — COMPREHENSIVE METABOLIC PANEL WITH GFR
AG Ratio: 1.3 (calc) (ref 1.0–2.5)
ALT: 32 U/L — ABNORMAL HIGH (ref 6–29)
AST: 55 U/L — ABNORMAL HIGH (ref 10–30)
Albumin: 4.2 g/dL (ref 3.6–5.1)
Alkaline phosphatase (APISO): 59 U/L (ref 31–125)
BUN: 16 mg/dL (ref 7–25)
CO2: 27 mmol/L (ref 20–32)
Calcium: 9.6 mg/dL (ref 8.6–10.2)
Chloride: 102 mmol/L (ref 98–110)
Creat: 0.92 mg/dL (ref 0.50–0.99)
Globulin: 3.2 g/dL (ref 1.9–3.7)
Glucose, Bld: 69 mg/dL (ref 65–99)
Potassium: 3.4 mmol/L — ABNORMAL LOW (ref 3.5–5.3)
Sodium: 137 mmol/L (ref 135–146)
Total Bilirubin: 0.6 mg/dL (ref 0.2–1.2)
Total Protein: 7.4 g/dL (ref 6.1–8.1)
eGFR: 79 mL/min/1.73m2 (ref >=60)

## 2024-03-29 LAB — LIPID PANEL
Cholesterol: 101 mg/dL (ref <200)
HDL: 46 mg/dL — ABNORMAL LOW (ref >=50)
LDL Cholesterol (Calc): 42 mg/dL
Non-HDL Cholesterol (Calc): 55 mg/dL (ref <130)
Total CHOL/HDL Ratio: 2.2 (calc) (ref <5.0)
Triglycerides: 52 mg/dL (ref <150)

## 2024-03-29 LAB — B12 AND FOLATE PANEL
Folate: 7.6 ng/mL
Vitamin B-12: 256 pg/mL (ref 200–1100)

## 2024-03-29 LAB — HEMOGLOBIN A1C
Hgb A1c MFr Bld: 5 % (ref <5.7)
Mean Plasma Glucose: 97 mg/dL
eAG (mmol/L): 5.4 mmol/L

## 2024-03-29 LAB — VITAMIN D 25 HYDROXY (VIT D DEFICIENCY, FRACTURES): Vit D, 25-Hydroxy: >150 ng/mL — ABNORMAL HIGH (ref 30–100)

## 2024-03-29 NOTE — Progress Notes (Addendum)
 THERAPIST PROGRESS NOTE Session Time: 9:10 Am - 10:01 AM  Participation Level: Active  Behavioral Response: CasualAlertAnxious  Type of Therapy: Individual Therapy  Treatment Goals addressed:  Active     BH CCP Acute or Chronic Trauma Reaction     LTG: Recall traumatic events without becoming overwhelmed with negative emotions (Progressing)     Start:  03/17/23    Expected End:  05/24/24         LTG: Pt identifies she would like to process her trauma hx (Progressing)     Start:  03/17/23    Expected End:  05/24/24         LTG: I want to be where the things I don't have control over, I can learn to let go.  (Progressing)     Start:  03/17/23    Expected End:  05/24/24         STG: Catherine Berg will identify internal and external stimuli that trigger PTSD symptoms (Progressing)     Start:  03/17/23    Expected End:  05/24/24         Provide Catherine Berg with education on trauma-oriented therapy     Start:  03/17/23         Educate Catherine Berg on common reactions to a traumatic experience     Start:  03/17/23         Educate Catherine Berg that exposure to trauma may result in brain and hormonal changes that can lead to difficulties with memory, learning, emotional regulation, poor impulse control, or depression that can persist     Start:  03/17/23         Encourage Catherine Berg to identify 3 trauma related cognitive distortions     Start:  03/17/23         Coping Skills      Start:  03/17/23      Will Work with patient to decrease the frequency of negative self-descriptive statements and increase the frequency of positive self- descriptive statements using CBT/DBT/REBT techniques per patient self report 3 out of 5 documented sessions. Some of the techniques that will be used will be CBT, positive affirmations, role playing, modeling, homework and journaling.          OP Depression     LTG: Reduce frequency, intensity, and duration of depression symptoms so that daily functioning  is improved (Progressing)     Start:  03/17/23    Expected End:  05/24/24       Goal Note     02/22/24: shares someday's she thinks about doing things to better her health but does not have motivation.          STG: Catherine Berg will identify cognitive patterns and beliefs that support depression (Progressing)     Start:  03/17/23    Expected End:  05/24/24         LTG: I just want to believe that I am okay to keep pushing and thriving. Not allow things to have control over you. (Progressing)     Start:  03/17/23    Expected End:  05/24/24       Goal Note     02/22/24: Patient reports she has been noticing improvements in her ability to focus on controllable factors and giver herself grace. Shares current stressors lead her to feel misplaced guilt.          Work with Catherine Berg to identify the major components of a recent episode of depression: physical symptoms, major thoughts and  images, and major behaviors they experienced     Start:  03/17/23         Catherine Berg will identify 3 trauma related cognitive distortions     Start:  03/17/23         Catherine Berg will identify 3 cognitive distortions they are currently using and write reframing statements to replace them     Start:  03/17/23         Catherine Berg will review pleasant activities list and select 2 activities to practice weekly for the next 8 weeks     Start:  03/17/23            ProgressTowards Goals: Progressing  Interventions: CBT, reframing, supportive  Summary: Catherine Berg is a 43 y.o. female who presents with anxiety. Patient identifies symptoms to include uncontrollable worry, negative self affect, low mood, anxious feelings, tearfulness, avoidance, restlessness. Pt was oriented times 5. Pt was cooperative and engaged. Pt denies SI/HI/AVH.   A staff member of outpatient MH in training was present by the name of Lauraine Ferrari.   The patient reflected on increased anxiety over the past two weeks due to the upcoming  change in seasons. She mentioned the holiday season bring up shared memories of her mother and brother, which made her emotional. She expressed feelings of misplaced guilt, stating that she feels unsupported and believes it is her responsibility to keep the family together, assuming a maternal role. The clinician encouraged her to reflect on and challenge this thought process while identifying actions she can take to enhance her self-care.  The patient identified driving at night as a trigger for her anxiety, as she often ruminates on negative "what-ifs." She reported feeling overwhelmed by her responsibilities and is actively challenging herself to move toward acceptance.  The patient denied using coping skills but acknowledged her efforts to rely on her support system during this time. We explored ways for her to focus on controllable factors and practice mindfulness in the present moment.  The patient expressed pride in increasing her physical activity, stating she works out two days a week, which has helped reduce her pain and asthma symptoms. During the session, we discussed her self-image, acknowledging a history of bullying related to her weight. The clinician briefly engaged in inner child work to help her improve her internal dialogue.  The patient also reflected on her weight loss concerns and issues related to insurance coverage for weight loss medication. We explored how she could reframe her perspective, focusing on external factors contributing to her improved health beyond medication. Connections were drawn between the patient's self-acceptance and positive views of her genetic attributes. We conducted a cognitive-behavioral therapy (CBT) reframing exercise to consider how she would address the situation if other parties were involved. We discussed ways she could practice kindness toward herself by engaging in self-care and managing misplaced expectations around caring for others. The patient  became tearful while expressing her desire to protect her nieces and nephews from her own childhood experiences. We explored how this response has been shaped by her trauma, discussing the importance of understanding that she cannot rewrite past experiences. Instead, we focused on finding alternative ways to show love and support to those around her while accepting her lived experiences.  The clinician noted that the patient has a phobia of spiders and occasionally sees them in her peripheral vision at night when waking up in a dark room. She has addressed this with her psychiatrist, who has adjusted her medications due  to potential side effects. However, the patient noted that this sometimes does not alleviate the hallucinations. She believes these experiences are a stress response, and the clinician will continue to explore this issue further with her.  Suicidal/Homicidal: Nowithout intent/plan  Therapist Response: Cln utilized active and supportive reflection to create a safe environment for patient to process recent life stressors.  Assess for current symptoms, stressors, safety since last session.  Utilized CBT to focus on reframing negative perspectives around weight management and serving as a caretaker for others.  Explored ways in which patient can utilize coping skills to manage ruminating anxious thoughts and ground herself in the present moment.  Plan: Return again in 3 weeks.  Diagnosis: PTSD (post-traumatic stress disorder)  MDD (major depressive disorder), recurrent, in partial remission  GAD (generalized anxiety disorder)  Collaboration of Care: AEB psychiatrist can access notes and cln. Will review psychiatrists' notes. Check in with the patient and will see LCSW per availability. Patient agreed with treatment recommendations.   Patient/Guardian was advised Release of Information must be obtained prior to any record release in order to collaborate their care with an outside  provider. Patient/Guardian was advised if they have not already done so to contact the registration department to sign all necessary forms in order for us  to release information regarding their care.   Consent: Patient/Guardian gives verbal consent for treatment and assignment of benefits for services provided during this visit. Patient/Guardian expressed understanding and agreed to proceed.   [name], LCSW 03/29/2024

## 2024-04-04 ENCOUNTER — Encounter: Payer: Self-pay | Admitting: Psychiatry

## 2024-04-04 ENCOUNTER — Ambulatory Visit (INDEPENDENT_AMBULATORY_CARE_PROVIDER_SITE_OTHER): Admitting: Psychiatry

## 2024-04-04 ENCOUNTER — Other Ambulatory Visit: Payer: Self-pay

## 2024-04-04 VITALS — BP 113/74 | HR 71 | Temp 97.6°F | Ht 64.0 in | Wt 167.4 lb

## 2024-04-04 DIAGNOSIS — F4310 Post-traumatic stress disorder, unspecified: Secondary | ICD-10-CM

## 2024-04-04 DIAGNOSIS — F411 Generalized anxiety disorder: Secondary | ICD-10-CM | POA: Diagnosis not present

## 2024-04-04 DIAGNOSIS — F330 Major depressive disorder, recurrent, mild: Secondary | ICD-10-CM

## 2024-04-04 DIAGNOSIS — G4700 Insomnia, unspecified: Secondary | ICD-10-CM | POA: Diagnosis not present

## 2024-04-04 MED ORDER — BUSPIRONE HCL 10 MG PO TABS: 10.0000 mg | ORAL_TABLET | Freq: Two times a day (BID) | ORAL | 0 refills | Status: AC

## 2024-04-04 MED ORDER — VENLAFAXINE HCL ER 150 MG PO CP24: 150.0000 mg | ORAL_CAPSULE | Freq: Every day | ORAL | 0 refills | Status: DC

## 2024-04-04 NOTE — Patient Instructions (Addendum)
 Continue venlafaxine  225 mg daily   Increase Buspar  10 mg twice a day  Continue trazodone  100 mg at night as needed for insomnia Continue hydroxyzine  10 mg daily as needed for anxiety  Next appointment: 1/26 at 8:30

## 2024-04-11 ENCOUNTER — Telehealth: Payer: Self-pay

## 2024-04-11 DIAGNOSIS — G629 Polyneuropathy, unspecified: Secondary | ICD-10-CM

## 2024-04-11 MED ORDER — QUTENZA (4 PATCH) 8 % EX KIT: 4.0000 | PACK | Freq: Once | CUTANEOUS | 0 refills | Status: AC

## 2024-04-11 NOTE — Telephone Encounter (Signed)
 Sent Rx to [facility]

## 2024-04-11 NOTE — Addendum Note (Signed)
 Addended by: GLENARD MIRE F on: 04/11/2024 01:23 PM   Modules accepted: Level of Service

## 2024-04-12 LAB — GENECONNECT MOLECULAR SCREEN: Genetic Analysis Overall Interpretation: NEGATIVE

## 2024-04-13 ENCOUNTER — Inpatient Hospital Stay: Admission: RE | Admit: 2024-04-13 | Discharge: 2024-04-13 | Attending: Family Medicine | Admitting: Family Medicine

## 2024-04-13 DIAGNOSIS — N63 Unspecified lump in unspecified breast: Secondary | ICD-10-CM | POA: Insufficient documentation

## 2024-04-13 DIAGNOSIS — M7989 Other specified soft tissue disorders: Secondary | ICD-10-CM | POA: Diagnosis present

## 2024-04-18 ENCOUNTER — Encounter: Attending: Physical Medicine and Rehabilitation | Admitting: Physical Medicine and Rehabilitation

## 2024-04-18 ENCOUNTER — Telehealth: Payer: Self-pay

## 2024-04-18 NOTE — Telephone Encounter (Signed)
 Called and left a detailed message to make patient aware that Qutenza  is not approved and appointment will need to be changed to follow up and patient needs to call the office to let us  know if she would like to continue as a follow up appointment or reschedule to a later date.  Previous attempts to contact patient has been made but, to date appointment is still scheduled as procedure.

## 2024-04-19 ENCOUNTER — Encounter: Payer: Self-pay | Admitting: Family Medicine

## 2024-04-19 ENCOUNTER — Ambulatory Visit: Admitting: Family Medicine

## 2024-04-19 ENCOUNTER — Ambulatory Visit: Admitting: Licensed Clinical Social Worker

## 2024-04-19 VITALS — BP 112/74 | HR 68 | Resp 16 | Ht 64.0 in | Wt 158.9 lb

## 2024-04-19 DIAGNOSIS — Z91199 Patient's noncompliance with other medical treatment and regimen due to unspecified reason: Secondary | ICD-10-CM

## 2024-04-19 DIAGNOSIS — I10 Essential (primary) hypertension: Secondary | ICD-10-CM

## 2024-04-19 DIAGNOSIS — J4489 Other specified chronic obstructive pulmonary disease: Secondary | ICD-10-CM | POA: Diagnosis not present

## 2024-04-19 DIAGNOSIS — E890 Postprocedural hypothyroidism: Secondary | ICD-10-CM | POA: Diagnosis not present

## 2024-04-19 DIAGNOSIS — G5793 Unspecified mononeuropathy of bilateral lower limbs: Secondary | ICD-10-CM | POA: Diagnosis not present

## 2024-04-19 DIAGNOSIS — E559 Vitamin D deficiency, unspecified: Secondary | ICD-10-CM

## 2024-04-19 DIAGNOSIS — F331 Major depressive disorder, recurrent, moderate: Secondary | ICD-10-CM

## 2024-04-19 DIAGNOSIS — E538 Deficiency of other specified B group vitamins: Secondary | ICD-10-CM

## 2024-04-19 DIAGNOSIS — G9332 Myalgic encephalomyelitis/chronic fatigue syndrome: Secondary | ICD-10-CM | POA: Diagnosis not present

## 2024-04-19 DIAGNOSIS — K581 Irritable bowel syndrome with constipation: Secondary | ICD-10-CM

## 2024-04-19 DIAGNOSIS — Z8639 Personal history of other endocrine, nutritional and metabolic disease: Secondary | ICD-10-CM

## 2024-04-19 DIAGNOSIS — U099 Post covid-19 condition, unspecified: Secondary | ICD-10-CM | POA: Diagnosis not present

## 2024-04-19 DIAGNOSIS — L732 Hidradenitis suppurativa: Secondary | ICD-10-CM

## 2024-04-19 MED ORDER — CYANOCOBALAMIN 1000 MCG/ML IJ SOLN
1000.0000 ug | Freq: Once | INTRAMUSCULAR | Status: AC
  Administered 2024-04-19: 10:00:00 1000 ug via INTRAMUSCULAR

## 2024-04-19 MED ORDER — OLMESARTAN MEDOXOMIL-HCTZ 20-12.5 MG PO TABS: 1.0000 | ORAL_TABLET | Freq: Every day | ORAL | 1 refills | Status: AC

## 2024-04-19 NOTE — Progress Notes (Unsigned)
 THERAPIST PROGRESS NOTE  Session Time: ***  Participation Level: Active   Behavioral Response: CasualAlertAnxious   Type of Therapy: Individual Therapy   Treatment Goals addressed:  Active       BH CCP Acute or Chronic Trauma Reaction       LTG: Recall traumatic events without becoming overwhelmed with negative emotions (Progressing)       Start:  03/17/23    Expected End:  05/24/24           LTG: Pt identifies she would like to process her trauma hx (Progressing)       Start:  03/17/23    Expected End:  05/24/24           LTG: I want to be where the things I don't have control over, I can learn to let go.  (Progressing)       Start:  03/17/23    Expected End:  05/24/24           STG: Minetta will identify internal and external stimuli that trigger PTSD symptoms (Progressing)       Start:  03/17/23    Expected End:  05/24/24           Provide Chelby with education on trauma-oriented therapy       Start:  03/17/23           Educate Charlena on common reactions to a traumatic experience       Start:  03/17/23           Educate Raylynn that exposure to trauma may result in brain and hormonal changes that can lead to difficulties with memory, learning, emotional regulation, poor impulse control, or depression that can persist       Start:  03/17/23           Encourage Dollie to identify 3 trauma related cognitive distortions       Start:  03/17/23           Coping Skills        Start:  03/17/23       Will Work with patient to decrease the frequency of negative self-descriptive statements and increase the frequency of positive self- descriptive statements using CBT/DBT/REBT techniques per patient self report 3 out of 5 documented sessions. Some of the techniques that will be used will be CBT, positive affirmations, role playing, modeling, homework and journaling.            OP Depression       LTG: Reduce frequency, intensity, and duration of depression  symptoms so that daily functioning is improved (Progressing)       Start:  03/17/23    Expected End:  05/24/24         Goal Note       02/22/24: shares someday's she thinks about doing things to better her health but does not have motivation.             STG: Alvine will identify cognitive patterns and beliefs that support depression (Progressing)       Start:  03/17/23    Expected End:  05/24/24           LTG: I just want to believe that I am okay to keep pushing and thriving. Not allow things to have control over you. (Progressing)       Start:  03/17/23    Expected End:  05/24/24         Goal Note  02/22/24: Patient reports she has been noticing improvements in her ability to focus on controllable factors and giver herself grace. Shares current stressors lead her to feel misplaced guilt.             Work with [name] to identify the major components of a recent episode of depression: physical symptoms, major thoughts and images, and major behaviors they experienced       Start:  03/17/23           Sundae will identify 3 trauma related cognitive distortions       Start:  03/17/23           Walda will identify 3 cognitive distortions they are currently using and write reframing statements to replace them       Start:  03/17/23           Teighan will review pleasant activities list and select 2 activities to practice weekly for the next 8 weeks       Start:  03/17/23             ProgressTowards Goals: Progressing   Interventions: CBT, reframing, supportive   Summary: DARNISHA VERNET is a 43 y.o. female who presents with anxiety. Patient identifies symptoms to include uncontrollable worry, negative self affect, low mood, anxious feelings, tearfulness, avoidance, restlessness. Pt was oriented times 5. Pt was cooperative and engaged. Pt denies SI/HI/AVH.     Suicidal/Homicidal: Nowithout intent/plan   Therapist Response: Cln utilized active and supportive  reflection to create a safe environment for patient to process recent life stressors.  Assess for current symptoms, stressors, safety since last session.   Plan: Return again in 3 weeks.  Diagnosis: PTSD (post-traumatic stress disorder)   MDD (major depressive disorder), recurrent, in partial remission   GAD (generalized anxiety disorder)   Collaboration of Care: AEB psychiatrist can access notes and cln. Will review psychiatrists' notes. Check in with the patient and will see LCSW per availability. Patient agreed with treatment recommendations.   Patient/Guardian was advised Release of Information must be obtained prior to any record release in order to collaborate their care with an outside provider. Patient/Guardian was advised if they have not already done so to contact the registration department to sign all necessary forms in order for us  to release information regarding their care.   Consent: Patient/Guardian gives verbal consent for treatment and assignment of benefits for services provided during this visit. Patient/Guardian expressed understanding and agreed to proceed.   [name], LCSW 04/19/2024

## 2024-04-19 NOTE — Progress Notes (Signed)
 Name: Catherine Berg   MRN: [id number]    DOB: [birth year]   Date:04/19/2024       Progress Note  Subjective  Chief Complaint  Chief Complaint  Patient presents with   Medical Management of Chronic Issues   Discussed the use of AI scribe software for clinical note transcription with the patient, who gave verbal consent to proceed.  History of Present Illness Catherine Berg is a 43 year old female with COPD and asthma who presents with worsening fatigue and shortness of breath.  She has been experiencing extreme fatigue and significant shortness of breath since yesterday, which occurs during activities such as talking and walking. This is unusual for her, and she has had to use her rescue inhaler more frequently than normal. No cold symptoms such as fever, chills, or nasal congestion are present. She has noticed that her symptoms may be worse when exposed to cold weather.  She has a history of major depression and is currently taking Buspar  as needed, hydroxyzine  as needed, and Effexor , which was recently increased to 115 mg in early December. She also takes trazodone  for sleep. This time of year is emotionally difficult for her, especially due to the absence of her mother, and she sees a therapist regularly. She has experienced significant weight loss, dropping from 167 lbs in early December to 158 lbs, which she attributes to a lack of appetite. Not eating exacerbates her fatigue.  She has post-COVID chronic fatigue, which had shown some improvement. However, she is uncertain if her current fatigue is related to this or her depression.  For her neuropathy, she is under the care of a neurologist and takes gabapentin  and metformin  as part of a study. She also receives Qtenza patches every three months, which she finds helpful in managing her leg pain.  She has a history of hypothyroidism post-ablation and takes levothyroxine  112 mcg daily. Her thyroid  levels have been stable. Her vitamin D  was  previously high, and her B12 was low. She also takes amlodipine  and HCTZ for hypertension, which has been well-controlled.  She has IBS with constipation and takes Linzess , with advice to increase water  intake to improve its efficacy.    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   Memory loss 02/28/2020   Arthralgia of hand 01/24/2019   Coronary artery calcification 09/16/2018   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   Incidental lung nodule, > 3mm and < 8mm 03/31/2017   Opioid-induced constipation 11/07/2015   Hives 09/02/2015   Anxiety 07/09/2015   Thyroid  nodule 02/01/2015   History of abnormal cervical Pap smear    Allergy-induced asthma, mild intermittent, uncomplicated 10/09/2013    Past Surgical History:  Procedure Laterality Date   BREAST REDUCTION SURGERY Bilateral 04/13/2023   Procedure: MAMMARY REDUCTION  (BREAST);  Surgeon: Waddell Leonce NOVAK, MD;  Location: [facility];  Service: Plastics;  Laterality: Bilateral;   BREAST SURGERY     CERVICAL POLYPECTOMY  CESAREAN SECTION     x 2   CHOLECYSTECTOMY     COLONOSCOPY WITH PROPOFOL  N/A 11/14/2018   Procedure: COLONOSCOPY WITH PROPOFOL ;  Surgeon: Janalyn Keene NOVAK, MD;  Location: [facility];  Service: Endoscopy;  Laterality: N/A;   ESOPHAGOGASTRODUODENOSCOPY (EGD) WITH PROPOFOL  N/A 11/14/2018    Procedure: ESOPHAGOGASTRODUODENOSCOPY (EGD) WITH PROPOFOL ;  Surgeon: Janalyn Keene NOVAK, MD;  Location: [facility];  Service: Endoscopy;  Laterality: N/A;  Latex allergy   GIVENS CAPSULE STUDY N/A 12/27/2018   Procedure: GIVENS CAPSULE STUDY;  Surgeon: Janalyn Keene NOVAK, MD;  Location: [facility];  Service: Endoscopy;  Laterality: N/A;   INCISION AND DRAINAGE OF WOUND Left 05/03/2023   Procedure: IRRIGATION AND DEBRIDEMENT WOUND;  Surgeon: Waddell Leonce NOVAK, MD;  Location: [facility];  Service: Plastics;  Laterality: Left;   REDUCTION MAMMAPLASTY Bilateral 04/2023    Family History  Problem Relation Age of Onset   Heart attack Mother 18   Hypertension Mother    Asthma Mother    Cancer Mother        laryngeal   Diabetes Mother        pre diabetic   Heart disease Mother    Mental illness Mother    Alcohol abuse Mother    Drug abuse Mother    Depression Mother    Anxiety disorder Mother    Bipolar disorder Mother    COPD Mother    Early death Mother    Learning disabilities Brother    ADD / ADHD Brother    Asthma Son    Hyperlipidemia Brother    Alcohol abuse Father    Heart disease Maternal Grandmother        heart attack, pacemaker   Heart attack Maternal Grandmother    Hypertension Maternal Grandmother    Stroke Maternal Grandmother    Hypertension Maternal Grandfather    Heart disease Paternal Grandmother        couple of major open heart surgeries, leaking valves   Hypertension Paternal Grandmother    Hypertension Paternal Grandfather    Breast cancer Neg Hx     Social History   Tobacco Use   Smoking status: Never   Smokeless tobacco: Never  Substance Use Topics   Alcohol use: Never    Current Medications[1]  Allergies[2]  I personally reviewed active problem list, medication list, allergies, family history with the patient/caregiver today.   ROS  Ten systems reviewed and is negative except as mentioned in HPI    Objective Physical  Exam  CONSTITUTIONAL: Patient appears well-developed and well-nourished. No distress. HEENT: Head atraumatic, normocephalic, neck supple. CARDIOVASCULAR: Normal rate, regular rhythm and normal heart sounds. No murmur heard. No BLE edema. PULMONARY: Effort normal and breath sounds normal. No respiratory distress. ABDOMINAL: There is no tenderness or distention. MUSCULOSKELETAL: Normal gait. Without gross motor or sensory deficit. PSYCHIATRIC: Patient has a normal mood and affect. Behavior is normal. Judgment and thought content normal.  Vitals:   04/19/24 0933  BP: 112/74  Pulse: 68  Resp: 16  SpO2: 98%  Weight: 158 lb 14.4 oz (72.1 kg)  Height: 5' 4 (1.626 m)    Body mass index is 27.28 kg/m.  Recent Results (from the past 2160 hours)  Lipid panel     Status: Abnormal   Collection Time: 03/28/24  3:06 PM  Result Value Ref Range   Cholesterol 101 <200 mg/dL   HDL 46 (L) > OR = 50 mg/dL   Triglycerides 52 <849 mg/dL  LDL Cholesterol (Calc) 42 mg/dL (calc)    Comment: Reference range: <100 . Desirable range <100 mg/dL for primary prevention;   <70 mg/dL for patients with CHD or diabetic patients  with > or = 2 CHD risk factors. SABRA LDL-C is now calculated using the Martin-Hopkins  calculation, which is a validated novel method providing  better accuracy than the Friedewald equation in the  estimation of LDL-C.  Gladis APPLETHWAITE et al. SANDREA. 7986;689(80): 2061-2068  (http://education.[facility].com/faq/FAQ164)    Total CHOL/HDL Ratio 2.2 <5.0 (calc)   Non-HDL Cholesterol (Calc) 55 <869 mg/dL (calc)    Comment: For patients with diabetes plus 1 major ASCVD risk  factor, treating to a non-HDL-C goal of <100 mg/dL  (LDL-C of <29 mg/dL) is considered a therapeutic  option.   Comprehensive metabolic panel with GFR     Status: Abnormal   Collection Time: 03/28/24  3:06 PM  Result Value Ref Range   Glucose, Bld 69 65 - 99 mg/dL    Comment: .            Fasting reference  interval .    BUN 16 7 - 25 mg/dL   Creat 9.07 9.49 - 9.00 mg/dL   eGFR 79 > OR = 60 fO/fpw/8.26f7   BUN/Creatinine Ratio SEE NOTE: 6 - 22 (calc)    Comment:    Not Reported: BUN and Creatinine are within    reference range. .    Sodium 137 135 - 146 mmol/L   Potassium 3.4 (L) 3.5 - 5.3 mmol/L   Chloride 102 98 - 110 mmol/L   CO2 27 20 - 32 mmol/L   Calcium 9.6 8.6 - 10.2 mg/dL   Total Protein 7.4 6.1 - 8.1 g/dL   Albumin 4.2 3.6 - 5.1 g/dL   Globulin 3.2 1.9 - 3.7 g/dL (calc)   AG Ratio 1.3 1.0 - 2.5 (calc)   Total Bilirubin 0.6 0.2 - 1.2 mg/dL   Alkaline phosphatase (APISO) 59 31 - 125 U/L   AST 55 (H) 10 - 30 U/L   ALT 32 (H) 6 - 29 U/L  CBC with Differential/Platelet     Status: None   Collection Time: 03/28/24  3:06 PM  Result Value Ref Range   WBC 6.3 3.8 - 10.8 Thousand/uL   RBC 4.12 3.80 - 5.10 Million/uL   Hemoglobin 12.0 11.7 - 15.5 g/dL   HCT 63.7 64.0 - 53.9 %   MCV 87.9 81.4 - 101.7 fL   MCH 29.1 27.0 - 33.0 pg   MCHC 33.1 31.6 - 35.4 g/dL    Comment: For adults, a slight decrease in the calculated MCHC value (in the range of 30 to 32 g/dL) is most likely not clinically significant; however, it should be interpreted with caution in correlation with other red cell parameters and the patient's clinical condition.    RDW 13.3 11.0 - 15.0 %   Platelets 320 140 - 400 Thousand/uL   MPV 10.9 7.5 - 12.5 fL   Neutro Abs 2,419 1,500 - 7,800 cells/uL   Absolute Lymphocytes 3,276 850 - 3,900 cells/uL   Absolute Monocytes 422 200 - 950 cells/uL   Eosinophils Absolute 151 15 - 500 cells/uL   Basophils Absolute 32 0 - 200 cells/uL   Neutrophils Relative % 38.4 %   Total Lymphocyte 52.0 %   Monocytes Relative 6.7 %   Eosinophils Relative 2.4 %   Basophils Relative 0.5 %  B12 and Folate Panel     Status: None   Collection  Time: 03/28/24  3:06 PM  Result Value Ref Range   Vitamin B-12 256 200 - 1,100 pg/mL    Comment: . Please Note: Although the reference range  for vitamin B12 is [phone] pg/mL, it has been reported that between 5 and 10% of patients with values between 200 and 400 pg/mL may experience neuropsychiatric and hematologic abnormalities due to occult B12 deficiency; less than 1% of patients with values above 400 pg/mL will have symptoms. .    Folate 7.6 ng/mL    Comment:                            Reference Range                            Low:           <3.4                            Borderline:    3.4-5.4                            Normal:        >5.4 .   VITAMIN D  25 Hydroxy (Vit-D Deficiency, Fractures)     Status: Abnormal   Collection Time: 03/28/24  3:06 PM  Result Value Ref Range   Vit D, 25-Hydroxy >150 (H) 30 - 100 ng/mL    Comment: Vitamin D  Status         25-OH Vitamin D : . Deficiency:                    <20 ng/mL Insufficiency:             20 - 29 ng/mL Optimal:                 > or = 30 ng/mL . For 25-OH Vitamin D  testing on patients on  D2-supplementation and patients for whom quantitation  of D2 and D3 fractions is required, the [facility]ure[facility](TM) 25-OH VIT D, (D2,D3), LC/MS/MS is recommended: order  code 07111 (patients >57yrs). . Vitamin D  is fat-soluble and therefore inadvertent or intentional ingestion of excessively high amounts could be toxic. Studies in children and adults suggest blood levels would need to exceed 150 ng/mL before there is any concern. Holick MF, Binkley Chenega, Bischoff-Ferrari HA, et al., Evaluation, treatment, and prevention of vitamin D  deficiency: an Endocrine Society clinical practice guideline. J.Clin. Endocrinol.Metab.2011;96 (7):1911-30.  SABRA See Note 1 . Note 1 . For additional information, please refer to  http://education.[facility].com/faq/FAQ199   (This link is being provided for informational/ educational purposes only.)   Hemoglobin A1c     Status: None   Collection Time: 03/28/24  3:06 PM  Result Value Ref Range   Hgb A1c MFr Bld 5.0 <5.7 %    Comment:  For the purpose of screening for the presence of diabetes: . <5.7%       Consistent with the absence of diabetes 5.7-6.4%    Consistent with increased risk for diabetes             (prediabetes) > or =6.5%  Consistent with diabetes . This assay result is consistent with a decreased risk of diabetes. . Currently, no consensus exists regarding use of hemoglobin A1c for diagnosis of diabetes in children. . According to American Diabetes  Association (ADA) guidelines, hemoglobin A1c <7.0% represents optimal control in non-pregnant diabetic patients. Different metrics may apply to specific patient populations.  Standards of Medical Care in Diabetes(ADA). .    Mean Plasma Glucose 97 mg/dL   eAG (mmol/L) 5.4 mmol/L  GeneConnect Molecular Screen     Status: None   Collection Time: 04/01/24  2:36 PM  Result Value Ref Range   Genetic Analysis Overall Interpretation Negative    Genetic Disease Assessed      This is a screening test and does not detect all pathogenic or likely pathogenic variant(s) in the tested genes; diagnostic testing is recommended for individuals with a personal or family history of heart disease or hereditary cancer. [facility] Tier One  Population Screen is a screening test that analyzes 11 genes related to hereditary breast and ovarian cancer (HBOC) syndrome, Lynch syndrome, and familial hypercholesterolemia. This test only reports clinically significant pathogenic and likely  pathogenic variants but does not report variants of uncertain significance (VUS). In addition, analysis of the PMS2 gene excludes exons 11-15, which overlap with a known pseudogene (PMS2CL).    Genetic Analysis Report      No pathogenic or likely pathogenic variants were detected in the genes analyzed by this test.Genetic test results should be interpreted in the context of an individual's personal medical and family history. Alteration to medical management is NOT  recommended based solely on this  result. Clinical correlation is advised.Additional Considerations- This is a screening test; individuals may still carry pathogenic or likely pathogenic variant(s) in the tested genes that are not detected by this test.-  For individuals at risk for these or other related conditions based on factors including personal or family history, diagnostic testing is recommended.- The absence of pathogenic or likely pathogenic variant(s) in the analyzed genes, while reassuring,  does not eliminate the possibility of a hereditary condition; there are other variants and genes associated with heart disease and hereditary cancer that are not included in this test.    Genes Tested See Notes     Comment: APOB, BRCA1, BRCA2, EPCAM, LDLR, LDLRAP1, PCSK9, PMS2, MLH1, MSH2, MSH6   Disclaimer See Notes     Comment: This test was developed and validated by [facility] This test has not been cleared or approved by the [place]  Food and Drug Administration (FDA). The [facility] laboratory is accredited by the College of American Pathologists (CAP) and certified under  the Clinical Laboratory Improvement Amendments (CLIA #: [id number]) to perform high-complexity clinical tests. This test is used for clinical purposes. It should not be regarded as investigational use only or for research use only.    Sequencing Location See Notes     Comment: Sequencing done at [facility] [address], [address], [place] [address] (CLIA# [id number])   Interpretation Methods and Limitations See Notes     Comment: Extracted DNA is enriched for targeted regions and then sequenced using the [facility] Exome+ (R) assay on an Illumina DNA sequencing system. Data is then aligned to a modified version of GRCh38 and all genes are analyzed using the MANE transcript and MANE  Plus Clinical transcript, when available. Small variant calling is completed using a customized version of [employer]'s DNAseq software, augmented by a proprietary small  variant caller for difficult variants. Copy number variants (CNVs) are then called  using a proprietary bioinformatics pipeline based on depth analysis with a comparison to similarly sequenced samples. Analysis of the PMS2 gene is limited to exons 1-10. Both the  MSH2 Boland inversion (exons 1-7) and the BRCA2 Alu insertion are detected  by identifying discordant read-pairs spanning the breakpoints. The interpretation and reporting of variants in APOB, PCSK9, and LDLR is specific to familial hypercholesterolemia; variants associated with hypobetalipoproteinemia are not i ncluded.  Interpretation is based upon guidelines published by the [employer] and [employer]), the Association for Molecular Pathology (AMP) or their modification by [employer] when available and/or review  of previous clinical assertions available in the [employer]. Interpretation is limited to the transcripts indicated on the report and +/- 10 bp into intronic regions, except as noted below. [facility] variant classifications include pathogenic, likely  pathogenic, variant of uncertain significance (VUS), likely benign, and benign. Only variants classified as pathogenic and likely pathogenic are included in the report. All reported variants are confirmed through secondary manual inspection of DNA  sequence data or orthogonal testing. Risk estimations and management guidelines included in this report are based on analysis of primary literature and recommendations of applicable professional societies, and should be regarded  as approximations.Based  on validation studies, this assay delivers > 99% sensitivity and specificity for single nucleotide variants and insertions and deletions (indels) up to 20 bp. Larger indels and complex variants are also reported but sensitivity may be reduced. Based on  validation studies, this assay delivers > 99% sensitivity to multi-exon CNVs and > 90%  sensitivity to single-exon CNVs. This test may not detect variants in challenging regions (such as short tandem repeats, homopolymer runs, and segment duplications),  sub-exonic CNVs, chromosomal aneuploidy, or variants in the presence of mosaicism. Phasing will be attempted and reported, when possible. Structural rearrangements such as inversions, translocations, complex rearrangements, and gene conversions are not  tested in this assay unless explicitly indicated. Additionally, deep intronic, promoter, and enhancer regions may not be covered. It is important to note that this is a screening test and cannot detect all di sease-causing variants. A negative result does  not guarantee the absence of a rare, undetectable variant in the genes analyzed; consider using a diagnostic test if there is significant personal and/or family history of one of the conditions analyzed by this test. Any potential incidental findings  outside of these genes and conditions will not be identified, nor reported. The results of a genetic test may be influenced by various factors, including bone marrow transplantation, blood transfusions, or in rare cases, hematolymphoid neoplasms.Gene  Specific Notes:APOB: analysis is limited to c.10580G>A and c.10579C>T; BRCA1: sequencing analysis extends to CDS +/-20 bp; BRCA2: analysis includes detection of c.[id number] and sequencing analysis extends to CDS +/-20 bp. EPCAM: analysis is limited  to CNV of exons 8-9; LDLR: analysis includes CNV of the promoter; MLH1: analysis includes CNV of the promoter; MSH2: analysis includes detection of the Boland inversion (inversion of exons 1 -7) and detection of c.942+3A>T, PMS2: analysis is limited to  exons 1-10.Donnice JINNY Kemp, PhD, [email or web address]     Diabetic Foot Exam:     PHQ2/9:    04/19/2024    9:30 AM 03/28/2024    2:25 PM 01/19/2024   10:53 AM 11/02/2023    5:08 PM 10/26/2023    4:15 PM  Depression screen PHQ 2/9   Decreased Interest 0 0 0    Down, Depressed, Hopeless 0 0 0    PHQ - 2 Score 0 0 0    Altered sleeping 0  0    Tired, decreased energy 0  0    Change in appetite  0  0    Feeling bad or failure about yourself  0  0    Trouble concentrating 0  0    Moving slowly or fidgety/restless 0  0    Suicidal thoughts 0  0    PHQ-9 Score 0  0     Difficult doing work/chores Not difficult at all  Not difficult at all       Information is confidential and restricted. Go to Review Flowsheets to unlock data.   Data saved with a previous flowsheet row definition    phq 9 is negative  Fall Risk:    04/19/2024    9:30 AM 03/28/2024    2:24 PM 01/19/2024   10:50 AM 10/15/2023    9:34 AM 10/13/2023   10:43 AM  Fall Risk   Falls in the past year? 0 0 0 0 0  Number falls in past yr: 0 0 0  0  Injury with Fall? 0 0  0   0   Risk for fall due to : No Fall Risks No Fall Risks No Fall Risks  No Fall Risks  Follow up Falls evaluation completed Falls evaluation completed Falls evaluation completed  Falls evaluation completed     Data saved with a previous flowsheet row definition      Assessment & Plan COPD with asthma Increased dyspnea and fatigue likely due to cold weather and activity. - Use rescue inhaler as needed. - Protect from cold weather using masks or gators. - Continue Dulera  twice daily.  Major depressive disorder, recurrent, moderate Fatigue and dyspnea may be linked to depression. Recent Effexor  increase to 115 mg. Weight loss possibly due to depression. Emphasized routine, hydration, and regular meals. - Continue Effexor  115 mg daily. - Continue Buspar  as needed. - Continue hydroxyzine  as needed. - Continue trazodone  for sleep. - Implement routine, hydration, and regular meals.  Neuropathy involving both lower extremities Managed with gabapentin  and Qtenza patches. Metformin  part of a study for neuropathy. - Continue gabapentin . - Continue Qtenza patches every three  months. - Continue metformin  as part of study.  Postablative hypothyroidism Well-managed on levothyroxine . Recent TSH 1.879 indicates good control. - Continue levothyroxine  112 mcg daily.  Vitamin B12 deficiency Low B12 levels contributing to fatigue. No recent B12 injections. - Administered B12 injection today. - Consider monthly B12 injections or sublingual B12 if injections are not feasible.  Vitamin D  deficiency Previously on prescription vitamin D , now advised to stop. - Discontinued prescription vitamin D . - Will discontinue over-the-counter vitamin D  in one month.  Post-COVID chronic fatigue Fatigue may be related to depression rather than post-COVID effects. - Continue to monitor symptoms and manage depression.  Irritable bowel syndrome with constipation Managed with Linzess . Advised increased water  intake for efficacy. - Continue Linzess . - Increase water  intake.  Hypertension Blood pressure well-controlled on amlodipine  and HCTZ. Discussed switching to Benicar  HCTZ for potassium levels and leg swelling. - Switched to Benicar  hydrochlorothiazide  20/12.5 mg daily  - Monitor blood pressure and potassium levels.  History of morbid obesity Significant weight loss achieved, BMI now 27-28. Discussed weight maintenance challenges and strategies. - Continue current weight management strategies. - Plan meals and use low-calorie options between meals.        [1]  Current Outpatient Medications:    amLODipine  (NORVASC ) 2.5 MG tablet, Take 1 tablet (2.5 mg total) by mouth daily., Disp: 90 tablet, Rfl: 1   bisacodyl  (DULCOLAX) 5 MG EC tablet, Take 1 tablet (5 mg total) by mouth  daily as needed for moderate constipation., Disp: 30 tablet, Rfl: 1   busPIRone  (BUSPAR ) 10 MG tablet, Take 1 tablet (10 mg total) by mouth 2 (two) times daily., Disp: 180 tablet, Rfl: 0   busPIRone  (BUSPAR ) 5 MG tablet, Take 1 tablet (5 mg total) by mouth 2 (two) times daily., Disp: 180 tablet, Rfl:  0   fluconazole  (DIFLUCAN ) 150 MG tablet, Take 1 tablet (150 mg total) by mouth every other day., Disp: 3 tablet, Rfl: 0   fluticasone  (FLONASE ) 50 MCG/ACT nasal spray, Place 2 sprays into both nostrils daily., Disp: 1 g, Rfl: 5   gabapentin  (NEURONTIN ) 100 MG capsule, Take 1 capsule (100 mg total) by mouth 3 (three) times daily., Disp: 270 capsule, Rfl: 3   hydrochlorothiazide  (HYDRODIURIL ) 12.5 MG tablet, Take 1 tablet (12.5 mg total) by mouth daily., Disp: 90 tablet, Rfl: 1   hydrOXYzine  (ATARAX ) 10 MG tablet, Take 10 mg by mouth daily as needed., Disp: , Rfl:    lactulose  (CHRONULAC ) 10 GM/15ML solution, Take 30 mLs (20 g total) by mouth 2 (two) times daily as needed for moderate constipation., Disp: 473 mL, Rfl: 0   levothyroxine  (SYNTHROID ) 112 MCG tablet, Take 112 mcg by mouth every morning., Disp: , Rfl:    linaclotide  (LINZESS ) 290 MCG CAPS capsule, Take 290 mcg by mouth daily before breakfast., Disp: , Rfl:    metFORMIN  (GLUCOPHAGE ) 500 MG tablet, Take 1 tablet (500 mg total) by mouth daily with breakfast. (Patient taking differently: Take 500 mg by mouth daily with breakfast. Takes for long covid neuropathy), Disp: 90 tablet, Rfl: 3   mometasone -formoterol  (DULERA ) 200-5 MCG/ACT AERO, Inhale 2 puffs into the lungs in the morning and at bedtime., Disp: 1 each, Rfl: 11   montelukast  (SINGULAIR ) 10 MG tablet, Take 1 tablet (10 mg total) by mouth at bedtime., Disp: 90 tablet, Rfl: 1   omeprazole  (PRILOSEC) 20 MG capsule, Take 1 capsule (20 mg total) by mouth 2 (two) times daily before a meal., Disp: 90 capsule, Rfl: 3   semaglutide -weight management (WEGOVY ) 2.4 MG/0.75ML SOAJ SQ injection, Inject 2.4 mg into the skin once a week., Disp: 9 mL, Rfl: 0   traZODone  (DESYREL ) 100 MG tablet, Take 1 tablet (100 mg total) by mouth at bedtime as needed for sleep., Disp: 90 tablet, Rfl: 1   venlafaxine  XR (EFFEXOR -XR) 150 MG 24 hr capsule, Take 1 capsule (150 mg total) by mouth daily with breakfast.,  Disp: 90 capsule, Rfl: 0   venlafaxine  XR (EFFEXOR -XR) 75 MG 24 hr capsule, Take 1 capsule (75 mg total) by mouth daily with breakfast. Total of 225 mg daily, take along with 150 mg cap, Disp: 90 capsule, Rfl: 1   VENTOLIN  HFA 108 (90 Base) MCG/ACT inhaler, Inhale 2 puffs into the lungs every 4 (four) hours as needed for wheezing or shortness of breath., Disp: 18 g, Rfl: 10 [2]  Allergies Allergen Reactions   Latex Hives    (Balloons, condoms, underwear elastic, gloves)   Shellfish Allergy Hives

## 2024-05-08 ENCOUNTER — Encounter: Payer: Self-pay | Admitting: Family Medicine

## 2024-05-09 ENCOUNTER — Other Ambulatory Visit: Payer: Self-pay

## 2024-05-09 ENCOUNTER — Encounter: Payer: Self-pay | Admitting: Licensed Clinical Social Worker

## 2024-05-09 ENCOUNTER — Ambulatory Visit (INDEPENDENT_AMBULATORY_CARE_PROVIDER_SITE_OTHER): Admitting: Licensed Clinical Social Worker

## 2024-05-09 DIAGNOSIS — F3341 Major depressive disorder, recurrent, in partial remission: Secondary | ICD-10-CM

## 2024-05-09 DIAGNOSIS — R7989 Other specified abnormal findings of blood chemistry: Secondary | ICD-10-CM

## 2024-05-09 DIAGNOSIS — F4310 Post-traumatic stress disorder, unspecified: Secondary | ICD-10-CM

## 2024-05-09 DIAGNOSIS — F411 Generalized anxiety disorder: Secondary | ICD-10-CM | POA: Diagnosis not present

## 2024-05-09 NOTE — Progress Notes (Unsigned)
" ° °  THERAPIST PROGRESS NOTE  Session Time: ***  Participation Level: Active  Behavioral Response: CasualAlert{BHH MOOD:22306}  Type of Therapy: Individual Therapy  Treatment Goals addressed: ***  ProgressTowards Goals: Progressing  Interventions: {CHL AMB BH Type of Intervention:21022753}  Summary: Catherine Berg is a 44 y.o. female who presents with anxiety. Patient identifies symptoms to include uncontrollable worry, negative self affect, low mood, anxious feelings, tearfulness, avoidance, restlessness. Pt was oriented times 5. Pt was cooperative and engaged. Pt denies SI/HI/AVH.   Suicidal/Homicidal: Nowithout intent/plan  Therapist Response: Cln utilized active and supportive reflection to create a safe environment for patient to process recent life stressors.  Assess for current symptoms, stressors, safety since last session.  Utilized CBT to focus on reframing negative perspectives around weight management and serving as a caretaker for others.  Explored ways in which patient can utilize coping skills to manage ruminating anxious thoughts and ground herself in the present moment.   Plan: Return again in 3 weeks.   Diagnosis: PTSD (post-traumatic stress disorder)   MDD (major depressive disorder), recurrent, in partial remission   GAD (generalized anxiety disorder)   Collaboration of Care: AEB psychiatrist can access notes and cln. Will review psychiatrists' notes. Check in with the patient and will see LCSW per availability. Patient agreed with treatment recommendations.  Patient/Guardian was advised Release of Information must be obtained prior to any record release in order to collaborate their care with an outside provider. Patient/Guardian was advised if they have not already done so to contact the registration department to sign all necessary forms in order for us  to release information regarding their care.   Consent: Patient/Guardian gives verbal consent for treatment  and assignment of benefits for services provided during this visit. Patient/Guardian expressed understanding and agreed to proceed.   [name], LCSW 05/09/2024  "

## 2024-05-09 NOTE — Progress Notes (Signed)
 "  THERAPIST PROGRESS NOTE  Session Time: 4:06-5:07pm  Participation Level: Active  Behavioral Response: CasualAlertAnxious and Tearful  Type of Therapy: Individual Therapy  Treatment Goals addressed:  Active     BH CCP Acute or Chronic Trauma Reaction     LTG: Recall traumatic events without becoming overwhelmed with negative emotions (Progressing)     Start:  03/17/23    Expected End:  05/24/24         LTG: Pt identifies she would like to process her trauma hx (Progressing)     Start:  03/17/23    Expected End:  05/24/24         LTG: I want to be where the things I don't have control over, I can learn to let go.  (Progressing)     Start:  03/17/23    Expected End:  05/24/24         STG: Catherine Berg will identify internal and external stimuli that trigger PTSD symptoms (Progressing)     Start:  03/17/23    Expected End:  05/24/24         Provide Catherine Berg with education on trauma-oriented therapy     Start:  03/17/23         Educate Catherine Berg on common reactions to a traumatic experience     Start:  03/17/23         Educate Catherine Berg that exposure to trauma may result in brain and hormonal changes that can lead to difficulties with memory, learning, emotional regulation, poor impulse control, or depression that can persist     Start:  03/17/23         Encourage Catherine Berg to identify 3 trauma related cognitive distortions     Start:  03/17/23         Coping Skills      Start:  03/17/23      Will Work with patient to decrease the frequency of negative self-descriptive statements and increase the frequency of positive self- descriptive statements using CBT/DBT/REBT techniques per patient self report 3 out of 5 documented sessions. Some of the techniques that will be used will be CBT, positive affirmations, role playing, modeling, homework and journaling.          OP Depression     LTG: Reduce frequency, intensity, and duration of depression symptoms so that daily  functioning is improved (Progressing)     Start:  03/17/23    Expected End:  05/24/24       Goal Note     02/22/24: shares someday's she thinks about doing things to better her health but does not have motivation.          STG: Catherine Berg will identify cognitive patterns and beliefs that support depression (Progressing)     Start:  03/17/23    Expected End:  05/24/24         LTG: I just want to believe that I am okay to keep pushing and thriving. Not allow things to have control over you. (Progressing)     Start:  03/17/23    Expected End:  05/24/24       Goal Note     02/22/24: Patient reports she has been noticing improvements in her ability to focus on controllable factors and giver herself grace. Shares current stressors lead her to feel misplaced guilt.          Work with Catherine Berg to identify the major components of a recent episode of depression: physical symptoms, major thoughts and  images, and major behaviors they experienced     Start:  03/17/23         Catherine Berg will identify 3 trauma related cognitive distortions     Start:  03/17/23         Catherine Berg will identify 3 cognitive distortions they are currently using and write reframing statements to replace them     Start:  03/17/23         Catherine Berg will review pleasant activities list and select 2 activities to practice weekly for the next 8 weeks     Start:  03/17/23            ProgressTowards Goals: Progressing  Interventions: Solution Focused, Strength-based, and Supportive  Summary: Catherine Berg is a 44 y.o. female who presents with anxiety. Patient identifies symptoms to include uncontrollable worry, negative self affect, low mood, anxious feelings, tearfulness, avoidance, restlessness. Pt was oriented times 5. Pt was cooperative and engaged. Pt denies SI/HI/AVH.   Patient utilized therapeutic space to process feeling overwhelmed by changes to Medicaid coverage.  Patient identified she feels uncontrollable  fear that she will lose coverage for weight management medications and is ultimately spiraling with negative thoughts related to severe weight gain and loss of control with weight management.  Patient identified stress has led to food cravings where she is getting up at night and eating.  Clinician worked with patient to redirect focus to controllable factors with patient identifying she is already working out 2 times a week.  Patient also identified an individual whom works in the [employer] realm who she feels comfortable consulting with regarding insurance coverage inquiries.  Clinician worked with patient to redirect focus to the present moment encouraging patient to limit anxious thoughts spiraling related to future events that have not occurred.  Patient became tearful reflecting on feeling overwhelmed related to upcoming transitions with 1 child leaving for college but expressed unrelenting pride in her daughter's ability to accomplish graduation and ultimately attendance to [employer].  Reflected on patient's hard work and efforts through being a single mother with a trauma history raising 2 successful children.  Close out the session with patient reflecting on pride she feels for all she has accomplished.  Suicidal/Homicidal: Nowithout intent/plan  Therapist Response: Cln utilized active and supportive reflection to create a safe environment for patient to process recent life stressors.  Assess for current symptoms, stressors, safety since last session.  Worked with patient to redirect focus to solutions she can engage in to obtain further knowledge and understanding related to insurance coverage and benefits.  Address patient's anxious thoughts and work to redirect focus to the present moment through reviewing different grounding strategies.  Explored ways in which patient can utilize coping skills to manage ruminating anxious thoughts and ground herself in the present moment.   Plan: Return again in  3 weeks.   Diagnosis: PTSD (post-traumatic stress disorder)   MDD (major depressive disorder), recurrent, in partial remission   GAD (generalized anxiety disorder)    Collaboration of Care: AEB psychiatrist can access notes and cln. Will review psychiatrists' notes. Check in with the patient and will see LCSW per availability. Patient agreed with treatment recommendations.   Patient/Guardian was advised Release of Information must be obtained prior to any record release in order to collaborate their care with an outside provider. Patient/Guardian was advised if they have not already done so to contact the registration department to sign all necessary forms in order for us  to release information regarding their  care.   Consent: Patient/Guardian gives verbal consent for treatment and assignment of benefits for services provided during this visit. Patient/Guardian expressed understanding and agreed to proceed.   Catherine Berg KATHEE Husband, LCSW 05/09/2024  "

## 2024-05-17 ENCOUNTER — Ambulatory Visit
Admission: RE | Admit: 2024-05-17 | Discharge: 2024-05-17 | Disposition: A | Discharge status: Home / Self Care | Source: Ambulatory Visit

## 2024-05-17 DIAGNOSIS — R7989 Other specified abnormal findings of blood chemistry: Secondary | ICD-10-CM | POA: Insufficient documentation

## 2024-05-19 ENCOUNTER — Other Ambulatory Visit: Payer: Self-pay

## 2024-05-19 DIAGNOSIS — J4531 Mild persistent asthma with (acute) exacerbation: Secondary | ICD-10-CM | POA: Insufficient documentation

## 2024-05-19 DIAGNOSIS — R0602 Shortness of breath: Secondary | ICD-10-CM | POA: Diagnosis present

## 2024-05-19 NOTE — ED Triage Notes (Signed)
 Pt presents for a dry cough while sleeping tonight. States she has not been sick but has been using inhaler more this week. No signs of distress noted. Denies fevers, chills, nausea, vomiting or pain.

## 2024-05-20 ENCOUNTER — Emergency Department: Admission: EM | Admit: 2024-05-20 | Discharge: 2024-05-20 | Disposition: A | Discharge status: Home / Self Care

## 2024-05-20 ENCOUNTER — Other Ambulatory Visit: Payer: Self-pay

## 2024-05-20 ENCOUNTER — Emergency Department

## 2024-05-20 DIAGNOSIS — J4531 Mild persistent asthma with (acute) exacerbation: Secondary | ICD-10-CM

## 2024-05-20 LAB — BASIC METABOLIC PANEL WITH GFR
Anion gap: 9 (ref 5–15)
BUN: 16 mg/dL (ref 6–20)
CO2: 22 mmol/L (ref 22–32)
Calcium: 9.3 mg/dL (ref 8.9–10.3)
Chloride: 105 mmol/L (ref 98–111)
Creatinine, Ser: 0.78 mg/dL (ref 0.44–1.00)
GFR, Estimated: >60 mL/min
Glucose, Bld: 93 mg/dL (ref 70–99)
Potassium: 3.7 mmol/L (ref 3.5–5.1)
Sodium: 136 mmol/L (ref 135–145)

## 2024-05-20 LAB — CBC
HCT: 34.6 % — ABNORMAL LOW (ref 36.0–46.0)
Hemoglobin: 11.6 g/dL — ABNORMAL LOW (ref 12.0–15.0)
MCH: 29.4 pg (ref 26.0–34.0)
MCHC: 33.5 g/dL (ref 30.0–36.0)
MCV: 87.6 fL (ref 80.0–100.0)
Platelets: 302 K/uL (ref 150–400)
RBC: 3.95 MIL/uL (ref 3.87–5.11)
RDW: 13.6 % (ref 11.5–15.5)
WBC: 7.5 K/uL (ref 4.0–10.5)
nRBC: 0 % (ref 0.0–0.2)

## 2024-05-20 MED ORDER — PREDNISONE 20 MG PO TABS: 60.0000 mg | ORAL_TABLET | Freq: Every day | ORAL | 0 refills | Status: AC

## 2024-05-20 MED ORDER — IPRATROPIUM-ALBUTEROL 0.5-2.5 (3) MG/3ML IN SOLN
3.0000 mL | Freq: Once | RESPIRATORY_TRACT | Status: AC
  Administered 2024-05-20: 3 mL via RESPIRATORY_TRACT
  Filled 2024-05-20: qty 3

## 2024-05-20 NOTE — ED Provider Notes (Signed)
 "  [facility] Provider Note    Event Date/Time   First MD Initiated Contact with Patient 05/20/24 [phone]     (approximate)   History   Asthma   HPI  Catherine Berg is a 44 y.o. female history of asthma presenting with concern of shortness of breath.  Throughout the week having increased use of her inhaler, without much improvement in symptoms, describes some chest tightness accompanying her symptoms.  No associated fever congestion, chronic rhinorrhea but not acutely changed recently.  Denies any new medications, has not had any swelling in her extremities, no prior history of PEs DVTs.     Physical Exam   Triage Vital Signs: ED Triage Vitals [05/19/24 2353]  Encounter Vitals Group     BP 131/82     Girls Systolic BP Percentile      Girls Diastolic BP Percentile      Boys Systolic BP Percentile      Boys Diastolic BP Percentile      Pulse Rate 74     Resp 20     Temp 98.2 F (36.8 C)     Temp Source Oral     SpO2 100 %     Weight 155 lb (70.3 kg)     Height 5' 3 (1.6 m)     Head Circumference      Peak Flow      Pain Score 0     Pain Loc      Pain Education      Exclude from Growth Chart     Most recent vital signs: Vitals:   05/20/24 0330 05/20/24 0400  BP: 114/70 105/65  Pulse: 64 70  Resp: 20 (!) 8  Temp:    SpO2: 100% 99%     General: Awake, no distress.  CV:  Good peripheral perfusion.  Resp:  Normal effort.  Slightly diminished breath sounds in the lower lobes, able to speak in full sentences no evidence of increased work of breathing Abd:  No distention.  Soft nontender Other:     ED Results / Procedures / Treatments   Labs (all labs ordered are listed, but only abnormal results are displayed) Labs Reviewed  CBC - Abnormal; Notable for the following components:      Result Value   Hemoglobin 11.6 (*)    HCT 34.6 (*)    All other components within normal limits  BASIC METABOLIC PANEL WITH GFR  POC URINE PREG, ED      EKG  On my independent interpretation of this EKG appears to be sinus rhythm with rate of about 60, axis of about 50, intervals appear to be within normal limits, no obvious ischemia and I appreciate this EKG   RADIOLOGY No acute cardiopulmonary findings on my independent interpretation of this chest x-ray  PROCEDURES:  Critical Care performed: No  Procedures   MEDICATIONS ORDERED IN ED: Medications  ipratropium-albuterol  (DUONEB) 0.5-2.5 (3) MG/3ML nebulizer solution 3 mL (3 mLs Nebulization Given 05/20/24 0310)     IMPRESSION / MDM / ASSESSMENT AND PLAN / ED COURSE  I reviewed the triage vital signs and the nursing notes.                               Patient's presentation is most consistent with acute complicated illness / injury requiring diagnostic workup.  44 year old female presenting with concern of shortness of breath.  She appears well she  is not in any acute respiratory distress she has slightly diminished lower breath sounds.  Blood work here is reassuring, chest x-ray without any evidence of acute cardiopulmonary findings.  Will provide nebulizer here and attempt symptomatic management and reassess.   Clinical Course as of 05/20/24 0522  Sat May 20, 2024  0403 Patient does endorse slight improvement in her breathing, we will have her discharged home with a brief course of steroids, discussed return precautions, she is agreeable with the plan. [SK]    Clinical Course User Index [SK] Fernand Rossie HERO, MD     FINAL CLINICAL IMPRESSION(S) / ED DIAGNOSES   Final diagnoses:  Mild persistent asthma with exacerbation     Rx / DC Orders   ED Discharge Orders          Ordered    predniSONE  (DELTASONE ) 20 MG tablet  Daily with breakfast        05/20/24 0404             Note:  This document was prepared using Dragon voice recognition software and may include unintentional dictation errors.   Fernand Rossie HERO, MD 05/20/24 0522  "

## 2024-05-20 NOTE — Discharge Instructions (Signed)
 You were seen today due to concern of shortness of breath, I have written for some steroids for you to take over the next few days, please take these as instructed.  If you have any worsening of symptoms such as increased shortness of breath, chest pain, or any other symptoms you find concerning please return to the emergency department immediately for further medical management.
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
## 2024-05-23 ENCOUNTER — Ambulatory Visit

## 2024-05-23 ENCOUNTER — Encounter: Payer: Self-pay | Admitting: Licensed Clinical Social Worker

## 2024-05-23 ENCOUNTER — Encounter: Payer: Self-pay | Admitting: Family Medicine

## 2024-05-29 ENCOUNTER — Encounter: Payer: Self-pay | Admitting: Psychiatry

## 2024-05-29 ENCOUNTER — Telehealth: Admitting: Psychiatry

## 2024-05-29 DIAGNOSIS — F411 Generalized anxiety disorder: Secondary | ICD-10-CM | POA: Diagnosis not present

## 2024-05-29 DIAGNOSIS — F330 Major depressive disorder, recurrent, mild: Secondary | ICD-10-CM

## 2024-05-29 DIAGNOSIS — F4310 Post-traumatic stress disorder, unspecified: Secondary | ICD-10-CM | POA: Diagnosis not present

## 2024-05-29 DIAGNOSIS — G4700 Insomnia, unspecified: Secondary | ICD-10-CM

## 2024-05-29 MED ORDER — TRAZODONE HCL 100 MG PO TABS: 100.0000 mg | ORAL_TABLET | Freq: Every evening | ORAL | 1 refills | Status: AC | PRN

## 2024-05-29 MED ORDER — VENLAFAXINE HCL ER 75 MG PO CP24: 75.0000 mg | ORAL_CAPSULE | Freq: Every day | ORAL | 0 refills | Status: AC

## 2024-05-29 MED ORDER — VENLAFAXINE HCL ER 150 MG PO CP24: 150.0000 mg | ORAL_CAPSULE | Freq: Every day | ORAL | 0 refills | Status: AC

## 2024-05-29 NOTE — Patient Instructions (Signed)
 Continue venlafaxine  225 mg daily   Continue Buspar  10 mg twice a day  Continue trazodone  100 mg at night as needed for insomnia Continue hydroxyzine  10 mg daily as needed for anxiety  Next appointment: 3/10 at 10:30

## 2024-05-31 ENCOUNTER — Ambulatory Visit (INDEPENDENT_AMBULATORY_CARE_PROVIDER_SITE_OTHER): Admitting: Licensed Clinical Social Worker

## 2024-05-31 DIAGNOSIS — Z91199 Patient's noncompliance with other medical treatment and regimen due to unspecified reason: Secondary | ICD-10-CM
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
## 2024-06-07 ENCOUNTER — Ambulatory Visit: Admitting: Family Medicine

## 2024-06-07 ENCOUNTER — Encounter: Payer: Self-pay | Admitting: Family Medicine

## 2024-06-07 VITALS — BP 118/80 | HR 71 | Resp 16 | Ht 63.0 in | Wt 171.4 lb

## 2024-06-07 DIAGNOSIS — J4489 Other specified chronic obstructive pulmonary disease: Secondary | ICD-10-CM

## 2024-06-07 DIAGNOSIS — R3915 Urgency of urination: Secondary | ICD-10-CM

## 2024-06-07 DIAGNOSIS — R002 Palpitations: Secondary | ICD-10-CM

## 2024-06-07 DIAGNOSIS — K219 Gastro-esophageal reflux disease without esophagitis: Secondary | ICD-10-CM

## 2024-06-07 DIAGNOSIS — D649 Anemia, unspecified: Secondary | ICD-10-CM

## 2024-06-07 MED ORDER — OMEPRAZOLE 20 MG PO CPDR: 20.0000 mg | DELAYED_RELEASE_CAPSULE | Freq: Two times a day (BID) | ORAL | 1 refills | Status: AC

## 2024-06-07 NOTE — Progress Notes (Signed)
 Name: Catherine Berg   MRN: [id number]    DOB: 03-06-1981   Date:06/07/2024       Progress Note  Subjective  Chief Complaint  Chief Complaint  Patient presents with   Asthma    Feeling better    Discussed the use of AI scribe software for clinical note transcription with the patient, who gave verbal consent to proceed.  History of Present Illness Catherine Berg is a 44 year old female with COPD and asthma who presents with shortness of breath and palpitations.  She experienced an acute asthma exacerbation on January 19, leading to emergency care due to severe shortness of breath and coughing. Treatment with prednisone  and albuterol  improved her symptoms. She continues to have occasional episodes of shortness of breath and dry mouth at night, but no severe recurrence like the initial episode. Her current respiratory medications include Dulera  and Ventolin .  She reports episodes of palpitations, particularly at night, with her heart rate measured at 89 bpm by her daughter. She has a history of anxiety, which may contribute to these symptoms. Her medications for anxiety include venlafaxine , hydroxyzine , buspirone , and trazodone .  She has a history of reflux, which may exacerbate her asthma symptoms. Her reflux symptoms include a choking sensation at night.  She reports urinary urgency without burning, which she attributes to increased water  intake. She has previously experienced side effects from medications like Detrol, including dry mouth and mental fog.  She has mild anemia with a hemoglobin drop from 12 to 11.6. She plans to receive a B12 injection soon.  She has a history of weight management with Wegovy , which she discontinued due to insurance coverage issues. She is anxious about potential weight gain and is focusing on portion control and exercise.  Her recent labs showed normal kidney function, normal A1c, high vitamin D  levels, and low B12 levels. Her lipid panel was satisfactory,  with a slight drop in good cholesterol.    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   Memory loss 02/28/2020   Arthralgia of hand 01/24/2019   Coronary artery calcification 09/16/2018   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   Incidental lung nodule, > 3mm and < 8mm 03/31/2017   Opioid-induced constipation 11/07/2015   Hives 09/02/2015   Anxiety 07/09/2015   Thyroid  nodule 02/01/2015   History of abnormal cervical Pap smear    Allergy-induced asthma, mild intermittent, uncomplicated 10/09/2013    Past Surgical History:  Procedure Laterality Date   BREAST REDUCTION SURGERY Bilateral 04/13/2023   Procedure: MAMMARY REDUCTION  (BREAST);  Surgeon: Waddell Leonce NOVAK, MD;  Location: [facility];  Service: Plastics;  Laterality: Bilateral;   BREAST SURGERY     CERVICAL POLYPECTOMY     CESAREAN SECTION     x 2   CHOLECYSTECTOMY     COLONOSCOPY WITH PROPOFOL  N/A 11/14/2018   Procedure: COLONOSCOPY WITH PROPOFOL ;  Surgeon: Janalyn Keene NOVAK, MD;  Location: [facility];  Service:  Endoscopy;  Laterality: N/A;   ESOPHAGOGASTRODUODENOSCOPY (EGD) WITH PROPOFOL  N/A 11/14/2018   Procedure: ESOPHAGOGASTRODUODENOSCOPY (EGD) WITH PROPOFOL ;  Surgeon: Janalyn Keene NOVAK, MD;  Location: [facility];  Service: Endoscopy;  Laterality: N/A;  Latex allergy   GIVENS CAPSULE STUDY  N/A 12/27/2018   Procedure: GIVENS CAPSULE STUDY;  Surgeon: Janalyn Keene NOVAK, MD;  Location: [facility];  Service: Endoscopy;  Laterality: N/A;   INCISION AND DRAINAGE OF WOUND Left 05/03/2023   Procedure: IRRIGATION AND DEBRIDEMENT WOUND;  Surgeon: Waddell Leonce NOVAK, MD;  Location: [facility];  Service: Plastics;  Laterality: Left;   REDUCTION MAMMAPLASTY Bilateral 04/2023    Family History  Problem Relation Age of Onset   Heart attack Mother 78   Hypertension Mother    Asthma Mother    Cancer Mother        laryngeal   Diabetes Mother        pre diabetic   Heart disease Mother    Mental illness Mother    Alcohol abuse Mother    Drug abuse Mother    Depression Mother    Anxiety disorder Mother    Bipolar disorder Mother    COPD Mother    Early death Mother    Learning disabilities Brother    ADD / ADHD Brother    Asthma Son    Hyperlipidemia Brother    Alcohol abuse Father    Heart disease Maternal Grandmother        heart attack, pacemaker   Heart attack Maternal Grandmother    Hypertension Maternal Grandmother    Stroke Maternal Grandmother    Hypertension Maternal Grandfather    Heart disease Paternal Grandmother        couple of major open heart surgeries, leaking valves   Hypertension Paternal Grandmother    Hypertension Paternal Grandfather    Breast cancer Neg Hx     Social History   Tobacco Use   Smoking status: Never   Smokeless tobacco: Never  Substance Use Topics   Alcohol use: Never    Current Medications[1]  Allergies[2]  I personally reviewed active problem list, medication list, allergies, family history with the patient/caregiver today.   ROS  Ten systems reviewed and is negative except as mentioned in HPI    Objective Physical Exam CONSTITUTIONAL: Patient appears well-developed and well-nourished.  No distress. HEENT: Head atraumatic, normocephalic, neck supple. CARDIOVASCULAR: Normal rate, regular rhythm and normal heart sounds.   No murmur heard. No BLE edema. PULMONARY: Effort normal and breath sounds normal. No respiratory distress. ABDOMINAL: There is no tenderness or distention. MUSCULOSKELETAL: Normal gait. Without gross motor or sensory deficit. PSYCHIATRIC: Patient has a normal mood and affect. behavior is normal. Judgment and thought content normal.  Vitals:   06/07/24 1452  BP: 118/80  Pulse: 71  Resp: 16  SpO2: 99%  Weight: 171 lb 6.4 oz (77.7 kg)  Height: 5' 3 (1.6 m)    Body mass index is 30.36 kg/m.  Recent Results (from the past 2160 hours)  Lipid panel     Status: Abnormal   Collection Time: 03/28/24  3:06 PM  Result Value Ref Range   Cholesterol 101 <200 mg/dL   HDL 46 (L) > OR = 50 mg/dL   Triglycerides 52 <849 mg/dL   LDL Cholesterol (Calc) 42 mg/dL (calc)    Comment: Reference range: <100 . Desirable range <100 mg/dL for primary prevention;   <70 mg/dL for patients with CHD or diabetic patients  with > or = 2  CHD risk factors. SABRA LDL-C is now calculated using the Martin-Hopkins  calculation, which is a validated novel method providing  better accuracy than the Friedewald equation in the  estimation of LDL-C.  Gladis APPLETHWAITE et al. SANDREA. 7986;689(80): 2061-2068  (http://education.[facility].com/faq/FAQ164)    Total CHOL/HDL Ratio 2.2 <5.0 (calc)   Non-HDL Cholesterol (Calc) 55 <869 mg/dL (calc)    Comment: For patients with diabetes plus 1 major ASCVD risk  factor, treating to a non-HDL-C goal of <100 mg/dL  (LDL-C of <29 mg/dL) is considered a therapeutic  option.   Comprehensive metabolic panel with GFR     Status: Abnormal   Collection Time: 03/28/24  3:06 PM  Result Value Ref Range   Glucose, Bld 69 65 - 99 mg/dL    Comment: .            Fasting reference interval .    BUN 16 7 - 25 mg/dL   Creat 9.07 9.49 - 9.00 mg/dL   eGFR 79 > OR = 60 fO/fpw/8.26f7   BUN/Creatinine Ratio SEE NOTE: 6 - 22 (calc)    Comment:    Not Reported: BUN and Creatinine are within     reference range. .    Sodium 137 135 - 146 mmol/L   Potassium 3.4 (L) 3.5 - 5.3 mmol/L   Chloride 102 98 - 110 mmol/L   CO2 27 20 - 32 mmol/L   Calcium 9.6 8.6 - 10.2 mg/dL   Total Protein 7.4 6.1 - 8.1 g/dL   Albumin 4.2 3.6 - 5.1 g/dL   Globulin 3.2 1.9 - 3.7 g/dL (calc)   AG Ratio 1.3 1.0 - 2.5 (calc)   Total Bilirubin 0.6 0.2 - 1.2 mg/dL   Alkaline phosphatase (APISO) 59 31 - 125 U/L   AST 55 (H) 10 - 30 U/L   ALT 32 (H) 6 - 29 U/L  CBC with Differential/Platelet     Status: None   Collection Time: 03/28/24  3:06 PM  Result Value Ref Range   WBC 6.3 3.8 - 10.8 Thousand/uL   RBC 4.12 3.80 - 5.10 Million/uL   Hemoglobin 12.0 11.7 - 15.5 g/dL   HCT 63.7 64.0 - 53.9 %   MCV 87.9 81.4 - 101.7 fL   MCH 29.1 27.0 - 33.0 pg   MCHC 33.1 31.6 - 35.4 g/dL    Comment: For adults, a slight decrease in the calculated MCHC value (in the range of 30 to 32 g/dL) is most likely not clinically significant; however, it should be interpreted with caution in correlation with other red cell parameters and the patient's clinical condition.    RDW 13.3 11.0 - 15.0 %   Platelets 320 140 - 400 Thousand/uL   MPV 10.9 7.5 - 12.5 fL   Neutro Abs 2,419 1,500 - 7,800 cells/uL   Absolute Lymphocytes 3,276 850 - 3,900 cells/uL   Absolute Monocytes 422 200 - 950 cells/uL   Eosinophils Absolute 151 15 - 500 cells/uL   Basophils Absolute 32 0 - 200 cells/uL   Neutrophils Relative % 38.4 %   Total Lymphocyte 52.0 %   Monocytes Relative 6.7 %   Eosinophils Relative 2.4 %   Basophils Relative 0.5 %  B12 and Folate Panel     Status: None   Collection Time: 03/28/24  3:06 PM  Result Value Ref Range   Vitamin B-12 256 200 - 1,100 pg/mL    Comment: . Please Note: Although the reference range for vitamin B12 is [phone] pg/mL, it has  been reported that between 5 and 10% of patients with values between 200 and 400 pg/mL may experience neuropsychiatric and hematologic abnormalities due to occult B12  deficiency; less than 1% of patients with values above 400 pg/mL will have symptoms. .    Folate 7.6 ng/mL    Comment:                            Reference Range                            Low:           <3.4                            Borderline:    3.4-5.4                            Normal:        >5.4 .   VITAMIN D  25 Hydroxy (Vit-D Deficiency, Fractures)     Status: Abnormal   Collection Time: 03/28/24  3:06 PM  Result Value Ref Range   Vit D, 25-Hydroxy >150 (H) 30 - 100 ng/mL    Comment: Vitamin D  Status         25-OH Vitamin D : . Deficiency:                    <20 ng/mL Insufficiency:             20 - 29 ng/mL Optimal:                 > or = 30 ng/mL . For 25-OH Vitamin D  testing on patients on  D2-supplementation and patients for whom quantitation  of D2 and D3 fractions is required, the [facility]ureD(TM) 25-OH VIT D, (D2,D3), LC/MS/MS is recommended: order  code 07111 (patients >31yrs). . Vitamin D  is fat-soluble and therefore inadvertent or intentional ingestion of excessively high amounts could be toxic. Studies in children and adults suggest blood levels would need to exceed 150 ng/mL before there is any concern. Holick MF, Binkley Homeacre-Lyndora, Bischoff-Ferrari HA, et al., Evaluation, treatment, and prevention of vitamin D  deficiency: an Endocrine Society clinical practice guideline. J.Clin. Endocrinol.Metab.2011;96 (7):1911-30.  SABRA See Note 1 . Note 1 . For additional information, please refer to  http://education.[facility].com/faq/FAQ199   (This link is being provided for informational/ educational purposes only.)   Hemoglobin A1c     Status: None   Collection Time: 03/28/24  3:06 PM  Result Value Ref Range   Hgb A1c MFr Bld 5.0 <5.7 %    Comment: For the purpose of screening for the presence of diabetes: . <5.7%       Consistent with the absence of diabetes 5.7-6.4%    Consistent with increased risk for diabetes             (prediabetes) > or =6.5%   Consistent with diabetes . This assay result is consistent with a decreased risk of diabetes. . Currently, no consensus exists regarding use of hemoglobin A1c for diagnosis of diabetes in children. . According to American Diabetes Association (ADA) guidelines, hemoglobin A1c <7.0% represents optimal control in non-pregnant diabetic patients. Different metrics may apply to specific patient populations.  Standards of Medical Care in Diabetes(ADA). .    Mean Plasma Glucose 97 mg/dL  eAG (mmol/L) 5.4 mmol/L  GeneConnect Molecular Screen     Status: None   Collection Time: 04/01/24  2:36 PM  Result Value Ref Range   Genetic Analysis Overall Interpretation Negative    Genetic Disease Assessed      This is a screening test and does not detect all pathogenic or likely pathogenic variant(s) in the tested genes; diagnostic testing is recommended for individuals with a personal or family history of heart disease or hereditary cancer. [facility] Tier One  Population Screen is a screening test that analyzes 11 genes related to hereditary breast and ovarian cancer (HBOC) syndrome, Lynch syndrome, and familial hypercholesterolemia. This test only reports clinically significant pathogenic and likely  pathogenic variants but does not report variants of uncertain significance (VUS). In addition, analysis of the PMS2 gene excludes exons 11-15, which overlap with a known pseudogene (PMS2CL).    Genetic Analysis Report      No pathogenic or likely pathogenic variants were detected in the genes analyzed by this test.Genetic test results should be interpreted in the context of an individual's personal medical and family history. Alteration to medical management is NOT  recommended based solely on this result. Clinical correlation is advised.Additional Considerations- This is a screening test; individuals may still carry pathogenic or likely pathogenic variant(s) in the tested genes that are not detected by this  test.-  For individuals at risk for these or other related conditions based on factors including personal or family history, diagnostic testing is recommended.- The absence of pathogenic or likely pathogenic variant(s) in the analyzed genes, while reassuring,  does not eliminate the possibility of a hereditary condition; there are other variants and genes associated with heart disease and hereditary cancer that are not included in this test.    Genes Tested See Notes     Comment: APOB, BRCA1, BRCA2, EPCAM, LDLR, LDLRAP1, PCSK9, PMS2, MLH1, MSH2, MSH6   Disclaimer See Notes     Comment: This test was developed and validated by [facility] This test has not been cleared or approved by the [place]  Food and Drug Administration (FDA). The [facility] laboratory is accredited by the College of American Pathologists (CAP) and certified under  the Clinical Laboratory Improvement Amendments (CLIA #: [id number]) to perform high-complexity clinical tests. This test is used for clinical purposes. It should not be regarded as investigational use only or for research use only.    Sequencing Location See Notes     Comment: Sequencing done at [facility] [address], [address], [place] [address] (CLIA# [id number])   Interpretation Methods and Limitations See Notes     Comment: Extracted DNA is enriched for targeted regions and then sequenced using the [facility] Exome+ (R) assay on an Illumina DNA sequencing system. Data is then aligned to a modified version of GRCh38 and all genes are analyzed using the MANE transcript and MANE  Plus Clinical transcript, when available. Small variant calling is completed using a customized version of [employer]'s DNAseq software, augmented by a proprietary small variant caller for difficult variants. Copy number variants (CNVs) are then called  using a proprietary bioinformatics pipeline based on depth analysis with a comparison to similarly sequenced samples. Analysis of  the PMS2 gene is limited to exons 1-10. Both the MSH2 Boland inversion (exons 1-7) and the BRCA2 Alu insertion are detected  by identifying discordant read-pairs spanning the breakpoints. The interpretation and reporting of variants in APOB, PCSK9, and LDLR is specific to familial hypercholesterolemia; variants associated with  hypobetalipoproteinemia are not i ncluded.  Interpretation is based upon guidelines published by the [employer] and [employer]), the Association for Molecular Pathology (AMP) or their modification by [employer] when available and/or review  of previous clinical assertions available in the [employer]. Interpretation is limited to the transcripts indicated on the report and +/- 10 bp into intronic regions, except as noted below. [facility] variant classifications include pathogenic, likely  pathogenic, variant of uncertain significance (VUS), likely benign, and benign. Only variants classified as pathogenic and likely pathogenic are included in the report. All reported variants are confirmed through secondary manual inspection of DNA  sequence data or orthogonal testing. Risk estimations and management guidelines included in this report are based on analysis of primary literature and recommendations of applicable professional societies, and should be regarded  as approximations.Based  on validation studies, this assay delivers > 99% sensitivity and specificity for single nucleotide variants and insertions and deletions (indels) up to 20 bp. Larger indels and complex variants are also reported but sensitivity may be reduced. Based on  validation studies, this assay delivers > 99% sensitivity to multi-exon CNVs and > 90% sensitivity to single-exon CNVs. This test may not detect variants in challenging regions (such as short tandem repeats, homopolymer runs, and segment duplications),  sub-exonic CNVs, chromosomal aneuploidy, or  variants in the presence of mosaicism. Phasing will be attempted and reported, when possible. Structural rearrangements such as inversions, translocations, complex rearrangements, and gene conversions are not  tested in this assay unless explicitly indicated. Additionally, deep intronic, promoter, and enhancer regions may not be covered. It is important to note that this is a screening test and cannot detect all di sease-causing variants. A negative result does  not guarantee the absence of a rare, undetectable variant in the genes analyzed; consider using a diagnostic test if there is significant personal and/or family history of one of the conditions analyzed by this test. Any potential incidental findings  outside of these genes and conditions will not be identified, nor reported. The results of a genetic test may be influenced by various factors, including bone marrow transplantation, blood transfusions, or in rare cases, hematolymphoid neoplasms.Gene  Specific Notes:APOB: analysis is limited to c.10580G>A and c.10579C>T; BRCA1: sequencing analysis extends to CDS +/-20 bp; BRCA2: analysis includes detection of [id number] and sequencing analysis extends to CDS +/-20 bp. EPCAM: analysis is limited  to CNV of exons 8-9; LDLR: analysis includes CNV of the promoter; MLH1: analysis includes CNV of the promoter; MSH2: analysis includes detection of the Boland inversion (inversion of exons 1 -7) and detection of c.942+3A>T, PMS2: analysis is limited to  exons 1-10.Donnice JINNY Kemp, PhD, [email or web address]   Basic metabolic panel     Status: None   Collection Time: 05/19/24 11:58 PM  Result Value Ref Range   Sodium 136 135 - 145 mmol/L   Potassium 3.7 3.5 - 5.1 mmol/L   Chloride 105 98 - 111 mmol/L   CO2 22 22 - 32 mmol/L   Glucose, Bld 93 70 - 99 mg/dL    Comment: Glucose reference range applies only to samples taken after fasting for at least 8 hours.   BUN 16 6 - 20 mg/dL   Creatinine,  Ser 9.21 0.44 - 1.00 mg/dL   Calcium 9.3 8.9 - 89.6 mg/dL   GFR, Estimated >39 >39 mL/min    Comment: (NOTE) Calculated using the CKD-EPI Creatinine Equation (2021)    Anion gap 9 5 - 15  Comment: Performed at [facility], [address] [place], [place] [address]  CBC     Status: Abnormal   Collection Time: 05/19/24 11:58 PM  Result Value Ref Range   WBC 7.5 4.0 - 10.5 K/uL   RBC 3.95 3.87 - 5.11 MIL/uL   Hemoglobin 11.6 (L) 12.0 - 15.0 g/dL   HCT 65.3 (L) 63.9 - 53.9 %   MCV 87.6 80.0 - 100.0 fL   MCH 29.4 26.0 - 34.0 pg   MCHC 33.5 30.0 - 36.0 g/dL   RDW 86.3 88.4 - 84.4 %   Platelets 302 150 - 400 K/uL   nRBC 0.0 0.0 - 0.2 %    Comment: Performed at [facility], [address] [place], [place] [address]    Diabetic Foot Exam:     PHQ2/9:    06/07/2024    2:52 PM 04/19/2024    9:30 AM 03/28/2024    2:25 PM 01/19/2024   10:53 AM 11/02/2023    5:08 PM  Depression screen PHQ 2/9  Decreased Interest 0 0 0 0   Down, Depressed, Hopeless 0 0 0 0   PHQ - 2 Score 0 0 0 0   Altered sleeping 0 0  0   Tired, decreased energy 0 0  0   Change in appetite 0 0  0   Feeling bad or failure about yourself  0 0  0   Trouble concentrating 0 0  0   Moving slowly or fidgety/restless 0 0  0   Suicidal thoughts 0 0  0   PHQ-9 Score 0 0  0    Difficult doing work/chores Not difficult at all Not difficult at all  Not difficult at all      Information is confidential and restricted. Go to Review Flowsheets to unlock data.   Data saved with a previous flowsheet row definition    phq 9 is negative  Fall Risk:    06/07/2024    2:51 PM 04/19/2024    9:30 AM 03/28/2024    2:24 PM 01/19/2024   10:50 AM 10/15/2023    9:34 AM  Fall Risk   Falls in the past year? 0 0 0 0 0  Number falls in past yr: 0 0 0 0   Injury with Fall? 0 0 0  0    Risk for fall due to : No Fall Risks No Fall Risks No Fall Risks No Fall Risks   Follow up Falls evaluation completed Falls  evaluation completed Falls evaluation completed Falls evaluation completed      Data saved with a previous flowsheet row definition      Assessment & Plan COPD with asthma Recent asthma exacerbation treated with prednisone  and albuterol . Symptoms improved, occasional dyspnea persists. Possible reflux contribution. - Continue Dulera  and Ventolin . - Consider restarting omeprazole  for reflux. - Raise head of bed, avoid eating before bed.  Palpitations Intermittent palpitations, heart rate up to 89 bpm. Anxiety and medication adjustments may contribute. Heart rate below 100 bpm is normal. - Monitor heart rate and symptoms. - Consider heart monitor if symptoms persist or worsen.  Gastroesophageal reflux disease Possible reflux contributing to asthma symptoms. Omeprazole  previously prescribed but not currently taken. - Restart omeprazole  for one month. - Raise head of bed, avoid eating before bed.  Anemia Mild anemia with hemoglobin drop. Possible B12 deficiency contributing. - Check CBC and iron levels in a couple of weeks. - Administered B12 injection today.  Urinary urgency Increased  urgency without burning. Possible infection or other causes. - Sent urine culture to rule out infection. - Consider Detrol if symptoms persist and no infection is found.  Major depressive disorder, recurrent, moderate Recent medication adjustments for anxiety. Current regimen includes venlafaxine , hydroxyzine , buspirone , and trazodone . - Continue current medication regimen.  Vitamin B12 deficiency Low B12 levels contributing to anemia. - Administered B12 injection today.  History of morbid obesity, weight regain risk Weight regain risk due to discontinuation of Wegovy . Current weight 171 lbs, up from 158 lbs in December. Anxiety about weight regain due to insurance changes. - Encouraged portion control and balanced meals with protein. - Encouraged regular exercise and monitoring weight twice a  week. - Encouraged use of low-calorie options like vegetable broth and fiber-rich snacks.        [1]  Current Outpatient Medications:    busPIRone  (BUSPAR ) 10 MG tablet, Take 1 tablet (10 mg total) by mouth 2 (two) times daily., Disp: 180 tablet, Rfl: 0   capsaicin  topical system (QUTENZA , 2 PATCH,) 8 %, Apply 2 patches topically once., Disp: , Rfl:    fluconazole  (DIFLUCAN ) 150 MG tablet, Take 1 tablet (150 mg total) by mouth every other day., Disp: 3 tablet, Rfl: 0   fluticasone  (FLONASE ) 50 MCG/ACT nasal spray, Place 2 sprays into both nostrils daily., Disp: 1 g, Rfl: 5   gabapentin  (NEURONTIN ) 100 MG capsule, Take 1 capsule (100 mg total) by mouth 3 (three) times daily., Disp: 270 capsule, Rfl: 3   hydrOXYzine  (ATARAX ) 10 MG tablet, Take 10 mg by mouth daily as needed., Disp: , Rfl:    lactulose  (CHRONULAC ) 10 GM/15ML solution, Take 30 mLs (20 g total) by mouth 2 (two) times daily as needed for moderate constipation., Disp: 473 mL, Rfl: 0   levothyroxine  (SYNTHROID ) 112 MCG tablet, Take 112 mcg by mouth every morning., Disp: , Rfl:    linaclotide  (LINZESS ) 290 MCG CAPS capsule, Take 290 mcg by mouth daily before breakfast., Disp: , Rfl:    metFORMIN  (GLUCOPHAGE ) 500 MG tablet, Take 1 tablet (500 mg total) by mouth daily with breakfast. (Patient taking differently: Take 500 mg by mouth daily with breakfast. Takes for long covid neuropathy), Disp: 90 tablet, Rfl: 3   mometasone -formoterol  (DULERA ) 200-5 MCG/ACT AERO, Inhale 2 puffs into the lungs in the morning and at bedtime., Disp: 1 each, Rfl: 11   montelukast  (SINGULAIR ) 10 MG tablet, Take 1 tablet (10 mg total) by mouth at bedtime., Disp: 90 tablet, Rfl: 1   olmesartan -hydrochlorothiazide  (BENICAR  HCT) 20-12.5 MG tablet, Take 1 tablet by mouth daily., Disp: 90 tablet, Rfl: 1   omeprazole  (PRILOSEC) 20 MG capsule, Take 1 capsule (20 mg total) by mouth 2 (two) times daily before a meal., Disp: 90 capsule, Rfl: 3   [START ON 06/21/2024]  traZODone  (DESYREL ) 100 MG tablet, Take 1 tablet (100 mg total) by mouth at bedtime as needed for sleep., Disp: 90 tablet, Rfl: 1   [START ON 07/03/2024] venlafaxine  XR (EFFEXOR -XR) 150 MG 24 hr capsule, Take 1 capsule (150 mg total) by mouth daily with breakfast., Disp: 90 capsule, Rfl: 0   venlafaxine  XR (EFFEXOR -XR) 75 MG 24 hr capsule, Take 1 capsule (75 mg total) by mouth daily with breakfast. Total of 225 mg daily. Take along with 150 mg cap, Disp: 90 capsule, Rfl: 0   VENTOLIN  HFA 108 (90 Base) MCG/ACT inhaler, Inhale 2 puffs into the lungs every 4 (four) hours as needed for wheezing or shortness of breath., Disp: 18 g, Rfl: 10 [  2]  Allergies Allergen Reactions   Latex Hives    (Balloons, condoms, underwear elastic, gloves)   Shellfish Allergy Hives

## 2024-06-09 LAB — CULTURE, URINE COMPREHENSIVE
MICRO NUMBER:: 17551020
RESULT:: NO GROWTH
SPECIMEN QUALITY:: ADEQUATE

## 2024-06-13 ENCOUNTER — Ambulatory Visit: Admitting: Licensed Clinical Social Worker

## 2024-07-11 ENCOUNTER — Ambulatory Visit: Admitting: Psychiatry

## 2024-10-30 ENCOUNTER — Ambulatory Visit: Admitting: Family Medicine
